# Patient Record
Sex: Male | Born: 1945 | Race: White | Hispanic: No | Marital: Married | Smoking: Former smoker
Health system: Southern US, Community
[De-identification: ages and names within clinical notes are randomized; demographics above are authoritative.]

## PROBLEM LIST (undated history)

## (undated) DIAGNOSIS — Q278 Other specified congenital malformations of peripheral vascular system: Secondary | ICD-10-CM

## (undated) DIAGNOSIS — K56609 Unspecified intestinal obstruction, unspecified as to partial versus complete obstruction: Secondary | ICD-10-CM

## (undated) DIAGNOSIS — J189 Pneumonia, unspecified organism: Secondary | ICD-10-CM

## (undated) DIAGNOSIS — I7122 Aneurysm of the aortic arch, without rupture: Secondary | ICD-10-CM

## (undated) DIAGNOSIS — C9110 Chronic lymphocytic leukemia of B-cell type not having achieved remission: Secondary | ICD-10-CM

## (undated) DIAGNOSIS — M1990 Unspecified osteoarthritis, unspecified site: Secondary | ICD-10-CM

## (undated) DIAGNOSIS — Z87440 Personal history of urinary (tract) infections: Secondary | ICD-10-CM

## (undated) DIAGNOSIS — K5750 Diverticulosis of both small and large intestine without perforation or abscess without bleeding: Secondary | ICD-10-CM

## (undated) DIAGNOSIS — K219 Gastro-esophageal reflux disease without esophagitis: Secondary | ICD-10-CM

## (undated) HISTORY — PX: OTHER SURGICAL HISTORY: SHX169

## (undated) HISTORY — PX: ORIF FINGER / THUMB FRACTURE: SUR932

## (undated) HISTORY — PX: HERNIA REPAIR: SHX51

## (undated) HISTORY — PX: MECKEL DIVERTICULUM EXCISION: SHX314

## (undated) HISTORY — DX: Pneumonia, unspecified organism: J18.9

---

## 2009-11-27 ENCOUNTER — Ambulatory Visit: Payer: Self-pay | Admitting: Family Medicine

## 2009-11-27 DIAGNOSIS — S838X9A Sprain of other specified parts of unspecified knee, initial encounter: Secondary | ICD-10-CM

## 2009-11-27 DIAGNOSIS — S86819A Strain of other muscle(s) and tendon(s) at lower leg level, unspecified leg, initial encounter: Secondary | ICD-10-CM

## 2009-12-03 ENCOUNTER — Telehealth (INDEPENDENT_AMBULATORY_CARE_PROVIDER_SITE_OTHER): Payer: Self-pay | Admitting: *Deleted

## 2010-02-05 NOTE — Assessment & Plan Note (Signed)
Summary: L calf pain/pull x 1 dy rm 5   Vital Signs:  Patient Profile:   65 Years Old Male CC:      L calf pain x 1 dy Height:     70.5 inches Weight:      210 pounds O2 Sat:      99 % O2 treatment:    Room Air Temp:     97.4 degrees F oral Pulse rate:   62 / minute Pulse rhythm:   regular Resp:     16 per minute BP sitting:   149 / 95  (left arm) Cuff size:   regular  Vitals Entered By: Areta Haber CMA (November 27, 2009 11:58 AM)                  Current Allergies: No known allergies History of Present Illness Chief Complaint: L calf pain x 1 dy History of Present Illness:  Subjective:  While playing basketball yesterday, patient felt sudden pulling sensation in his left posterior calf.  He has had persistent mild pain when walking, but not at rest.  No shortness of breath or chest pain.  Current Problems: MUSCLE STRAIN, LEFT CALF (ICD-844.8) FAMILY HISTORY DIABETES 1ST DEGREE RELATIVE (ICD-V18.0)   REVIEW OF SYSTEMS Constitutional Symptoms      Denies fever, chills, night sweats, weight loss, weight gain, and fatigue.  Eyes       Denies change in vision, eye pain, eye discharge, glasses, contact lenses, and eye surgery. Ear/Nose/Throat/Mouth       Denies hearing loss/aids, change in hearing, ear pain, ear discharge, dizziness, frequent runny nose, frequent nose bleeds, sinus problems, sore throat, hoarseness, and tooth pain or bleeding.  Respiratory       Denies dry cough, productive cough, wheezing, shortness of breath, asthma, bronchitis, and emphysema/COPD.  Cardiovascular       Denies murmurs, chest pain, and tires easily with exhertion.    Gastrointestinal       Denies stomach pain, nausea/vomiting, diarrhea, constipation, blood in bowel movements, and indigestion. Genitourniary       Denies painful urination, kidney stones, and loss of urinary control. Neurological       Denies paralysis, seizures, and fainting/blackouts. Musculoskeletal  Complains of muscle pain and decreased range of motion.      Denies joint pain, joint stiffness, redness, swelling, muscle weakness, and gout.      Comments: L calf pain/pull x 1dy Skin       Denies bruising, unusual mles/lumps or sores, and hair/skin or nail changes.  Psych       Denies mood changes, temper/anger issues, anxiety/stress, speech problems, depression, and sleep problems. Other Comments: Pt states he was playing basketball and pulled his L calf muscle.    Past History:  Past Medical History: Unremarkable  Past Surgical History: Inguinal herniorrhaphy  Family History: Family History Diabetes 1st degree relative  Social History: Single Never Smoked Alcohol use-yes Drug use-no Regular exercise-no Smoking Status:  never Does Patient Exercise:  no Drug Use:  no   Objective:  Appearance:  Patient appears healthy, stated age, and in no acute distress  Left leg:  Knee has full range of motion.  Posterior/lateral calf has mild point tenderness laterally.  No swelling, erythema, or warmth.  Distal neurovascular intact.  Homan's test negative.  Achilles tendon intact. Assessment New Problems: MUSCLE STRAIN, LEFT CALF (ICD-844.8) FAMILY HISTORY DIABETES 1ST DEGREE RELATIVE (ICD-V18.0)  MILD CALF STRAIN.  DOES NOT APPEAR TO BE PLANTARIS STRAIN/RUPTURE NOTE  ELEVATED BP TODAY.  Plan New Orders: Ace Wraps 3-5 in/yard  [Z6109] New Patient Level III [99203] Planning Comments:   Continue ice pack several times daily.  Take Ibuprofen 200mg , 4 tabs every 8 hours with food.  Ace wrap applied; recommend obtaining medical graduated compression stockings.  Begin stretching exercises in 3 to 5 days  ([facility] information and instruction patient handout given)   Follow-up with sports med clinic if not improving about 2 weeks.   Recheck BP and follow-up with PCP if remains elevated.   The patient and/or caregiver has been counseled thoroughly with regard to medications  prescribed including dosage, schedule, interactions, rationale for use, and possible side effects and they verbalize understanding.  Diagnoses and expected course of recovery discussed and will return if not improved as expected or if the condition worsens. Patient and/or caregiver verbalized understanding.   Orders Added: 1)  Ace Wraps 3-5 in/yard  [A6449] 2)  New Patient Level III [60454]

## 2010-02-05 NOTE — Letter (Signed)
Summary: Out of Work  [facility]  [address]   [place], [place] [address]   Phone: [phone]  Fax: [phone]    November 27, 2009   Employee:  DANIELA HERNAN    To Whom It May Concern:   For Medical reasons, the above employee should avoid heavy lifting, pushing, pulling, and straining for two weeks.    If you need additional information, please feel free to contact our office.         Sincerely,    Donna Christen MD

## 2010-02-05 NOTE — Progress Notes (Signed)
  Phone Note Outgoing Call Call back at [facility] Phone [phone]   Call placed by: Emilio Math,  December 03, 2009 12:54 PM Call placed to: Patient Action Taken: Phone Call Completed Summary of Call: Leg is much better appreciates the call

## 2011-01-06 ENCOUNTER — Emergency Department
Admission: EM | Admit: 2011-01-06 | Discharge: 2011-01-06 | Disposition: A | Discharge status: Home / Self Care | Payer: Medicare Other | Source: Home / Self Care | Attending: Emergency Medicine | Admitting: Emergency Medicine

## 2011-01-06 ENCOUNTER — Encounter: Payer: Self-pay | Admitting: *Deleted

## 2011-01-06 DIAGNOSIS — N390 Urinary tract infection, site not specified: Secondary | ICD-10-CM

## 2011-01-06 LAB — POCT URINALYSIS DIPSTICK
Nitrite, UA: POSITIVE
Spec Grav, UA: 1.025 (ref 1.005–1.03)
Urobilinogen, UA: 0.2 (ref 0–1)
pH, UA: 6 (ref 5–8)

## 2011-01-06 MED ORDER — SULFAMETHOXAZOLE-TMP DS 800-160 MG PO TABS: 1.0000 | ORAL_TABLET | Freq: Two times a day (BID) | ORAL | Status: AC

## 2011-01-06 NOTE — ED Provider Notes (Signed)
History     CSN: [id number]  Arrival date & time 01/06/11  1041   First MD Initiated Contact with Patient 01/06/11 1121      Chief Complaint  Patient presents with  . Dysuria    (Consider location/radiation/quality/duration/timing/severity/associated sxs/prior treatment) HPI Jason Abbott is a 65 y.o. male who presents today with UTI symptoms for 2 weeks. He has a h/o UTI's about every 2 years. + dysuria + frequency No urgency No hematuria No vaginal discharge No fever/chills No lower abdominal pain No back pain No fatigue    History reviewed. No pertinent past medical history.  Past Surgical History  Procedure Date  . Hernia repair     Family History  Problem Relation Age of Onset  . Diabetes Sister     History  Substance Use Topics  . Smoking status: Never Smoker   . Smokeless tobacco: Not on file  . Alcohol Use: Yes      Review of Systems  Allergies  Review of patient's allergies indicates no known allergies.  Home Medications   Current Outpatient Rx  Name Route Sig Dispense Refill  . SULFAMETHOXAZOLE-TMP DS 800-160 MG PO TABS Oral Take 1 tablet by mouth 2 (two) times daily. 10 tablet 0    BP 115/78  Pulse 69  Temp(Src) 98 F (36.7 C) (Oral)  Resp 16  Ht 6' (1.829 m)  Wt 210 lb (95.255 kg)  BMI 28.48 kg/m2  SpO2 97%  Physical Exam  ED Course  Procedures (including critical care time)   Labs Reviewed  POCT URINALYSIS DIPSTICK   No results found.   1. Urinary tract infection, site not specified       MDM  1) Take the prescribed antibiotic as directed. 2) A urinalysis was done in clinic.  A urine culture is pending. 3) Follow up with your PCP or urologist if not improving or if worsening symptoms.     Lily Kocher, MD 01/06/11 [phone]

## 2011-01-06 NOTE — ED Notes (Signed)
Patient c/o dysuria and urinary frequency x 2-3 weeks. He started taking cranberry tabs and stopped 3 days ago.

## 2011-04-04 ENCOUNTER — Emergency Department (INDEPENDENT_AMBULATORY_CARE_PROVIDER_SITE_OTHER)
Admission: EM | Admit: 2011-04-04 | Discharge: 2011-04-04 | Disposition: A | Discharge status: Home / Self Care | Payer: Medicare Other | Source: Home / Self Care | Attending: Emergency Medicine | Admitting: Emergency Medicine

## 2011-04-04 DIAGNOSIS — R350 Frequency of micturition: Secondary | ICD-10-CM | POA: Diagnosis not present

## 2011-04-04 DIAGNOSIS — R300 Dysuria: Secondary | ICD-10-CM

## 2011-04-04 HISTORY — DX: Personal history of urinary (tract) infections: Z87.440

## 2011-04-04 HISTORY — DX: Unspecified intestinal obstruction, unspecified as to partial versus complete obstruction: K56.609

## 2011-04-04 LAB — POCT URINALYSIS DIP (MANUAL ENTRY)
Bilirubin, UA: NEGATIVE
Glucose, UA: NEGATIVE
Ketones, POC UA: NEGATIVE
Spec Grav, UA: >=1.03 (ref 1.005–1.03)
Urobilinogen, UA: 0.2 (ref 0–1)

## 2011-04-04 MED ORDER — SULFAMETHOXAZOLE-TMP DS 800-160 MG PO TABS: 1.0000 | ORAL_TABLET | Freq: Two times a day (BID) | ORAL | Status: AC

## 2011-04-04 NOTE — ED Provider Notes (Signed)
History     CSN: [id number]  Arrival date & time 04/04/11  1731   First MD Initiated Contact with Patient 04/04/11 1736      Chief Complaint  Patient presents with  . Urinary Frequency    x 4 days    (Consider location/radiation/quality/duration/timing/severity/associated sxs/prior treatment) HPI Jason Abbott is a 66 y.o. male who presents today with UTI symptoms for 4 days.  He was here a few months ago for similar symptoms and took Bactrim which resulted symptoms quickly.   +/- dysuria + frequency No urgency No hematuria No penile discharge No fever/chills No lower abdominal pain No back pain No fatigue    Past Medical History  Diagnosis Date  . H/O urinary infection   . Bowel obstruction     Past Surgical History  Procedure Date  . Hernia repair     Family History  Problem Relation Age of Onset  . Diabetes Sister   . Cancer Mother     pancreatic  . Diabetes Brother   . Diabetes Brother     History  Substance Use Topics  . Smoking status: Former Smoker -- 0.5 packs/day for 23 years    Quit date: 03/05/1988  . Smokeless tobacco: Not on file  . Alcohol Use: 3.6 oz/week    6 Cans of beer per week      Review of Systems  All other systems reviewed and are negative.    Allergies  Review of patient's allergies indicates no known allergies.  Home Medications   Current Outpatient Rx  Name Route Sig Dispense Refill  . IBUPROFEN 200 MG PO TABS Oral Take 200 mg by mouth every 6 (six) hours as needed.    . SULFAMETHOXAZOLE-TMP DS 800-160 MG PO TABS Oral Take 1 tablet by mouth 2 (two) times daily. 10 tablet 0    BP 138/90  Pulse 56  Temp(Src) 97.9 F (36.6 C) (Oral)  Resp 16  Ht 5' 10.5" (1.791 m)  Wt 210 lb (95.255 kg)  BMI 29.71 kg/m2  SpO2 98%  Physical Exam  Nursing note and vitals reviewed. Constitutional: He is oriented to person, place, and time. He appears well-developed and well-nourished.  HENT:  Head: Normocephalic and atraumatic.    Eyes: No scleral icterus.  Neck: Neck supple.  Cardiovascular: Regular rhythm and normal heart sounds.   Pulmonary/Chest: Effort normal and breath sounds normal. No respiratory distress.  Abdominal: Soft. Normal appearance and bowel sounds are normal. He exhibits no mass. There is no rebound, no guarding and no CVA tenderness.  Neurological: He is alert and oriented to person, place, and time.  Skin: Skin is warm and dry.  Psychiatric: He has a normal mood and affect. His speech is normal.    ED Course  Procedures (including critical care time)   Labs Reviewed  POCT URINALYSIS DIP (MANUAL ENTRY) - Abnormal; Notable for the following:   URINE CULTURE   No results found.   1. Dysuria   2. Frequent urination       MDM  1) Take the prescribed antibiotic as directed.   2) A urinalysis was done in clinic.  A urine culture is pending. 3) Follow up a urologist if not improving or if worsening symptoms.  He states that he does not have a PCP but I advised him that he should especially at his age and with recurrent urinary infections.  I've advised him that he will likely get some blood work including a PSA test.  I gave him  information for local physicians and he will call for an appointment.   Marlaine Hind, MD 04/04/11 [phone]

## 2011-04-04 NOTE — ED Notes (Signed)
Patient complains of frequent urination x 4 days. States he does have some burning. He has a history of UTI's.

## 2011-04-07 LAB — URINE CULTURE: Colony Count: >=100000

## 2011-09-15 ENCOUNTER — Other Ambulatory Visit: Payer: Self-pay | Admitting: Physician Assistant

## 2011-09-15 ENCOUNTER — Ambulatory Visit: Payer: Self-pay

## 2011-09-15 DIAGNOSIS — Z043 Encounter for examination and observation following other accident: Secondary | ICD-10-CM | POA: Diagnosis not present

## 2011-09-15 DIAGNOSIS — S6990XA Unspecified injury of unspecified wrist, hand and finger(s), initial encounter: Secondary | ICD-10-CM

## 2011-09-15 DIAGNOSIS — S62609A Fracture of unspecified phalanx of unspecified finger, initial encounter for closed fracture: Secondary | ICD-10-CM | POA: Diagnosis not present

## 2011-11-20 DIAGNOSIS — J329 Chronic sinusitis, unspecified: Secondary | ICD-10-CM | POA: Diagnosis not present

## 2011-11-20 DIAGNOSIS — R0902 Hypoxemia: Secondary | ICD-10-CM | POA: Diagnosis not present

## 2012-01-07 ENCOUNTER — Emergency Department (INDEPENDENT_AMBULATORY_CARE_PROVIDER_SITE_OTHER)
Admission: EM | Admit: 2012-01-07 | Discharge: 2012-01-07 | Disposition: A | Discharge status: Home / Self Care | Payer: Medicare Other | Source: Home / Self Care | Attending: Family Medicine | Admitting: Family Medicine

## 2012-01-07 ENCOUNTER — Encounter: Payer: Self-pay | Admitting: Emergency Medicine

## 2012-01-07 DIAGNOSIS — N390 Urinary tract infection, site not specified: Secondary | ICD-10-CM

## 2012-01-07 DIAGNOSIS — N3000 Acute cystitis without hematuria: Secondary | ICD-10-CM | POA: Diagnosis not present

## 2012-01-07 LAB — POCT URINALYSIS DIP (MANUAL ENTRY)
Nitrite, UA: NEGATIVE
Protein Ur, POC: 100
Spec Grav, UA: >=1.03 (ref 1.005–1.03)
Urobilinogen, UA: 0.2 (ref 0–1)

## 2012-01-07 MED ORDER — CIPROFLOXACIN HCL 500 MG PO TABS: 500.0000 mg | ORAL_TABLET | Freq: Two times a day (BID) | ORAL | Status: DC

## 2012-01-07 NOTE — ED Provider Notes (Signed)
History     CSN: [id number]  Arrival date & time 01/07/12  1126   None     Chief Complaint  Patient presents with  . Urinary Frequency  . Dysuria      HPI Comments: Reports frequency of urination and mild dysuria for 2 days.  Patient is a 67 y.o. male presenting with dysuria. The history is provided by the patient.  Dysuria  This is a new problem. The current episode started 2 days ago. The problem occurs intermittently. The problem has been gradually worsening. The quality of the pain is described as burning. The pain is mild. There has been no fever. Associated symptoms include frequency. Pertinent negatives include no chills, no sweats, no nausea, no vomiting, no discharge, no hematuria, no hesitancy, no urgency and no flank pain. He has tried nothing for the symptoms.    Past Medical History  Diagnosis Date  . H/O urinary infection   . Bowel obstruction     Past Surgical History  Procedure Date  . Hernia repair     Family History  Problem Relation Age of Onset  . Diabetes Sister   . Cancer Mother     pancreatic  . Diabetes Brother   . Diabetes Brother     History  Substance Use Topics  . Smoking status: Former Smoker -- 0.5 packs/day for 23 years    Quit date: 03/05/1988  . Smokeless tobacco: Not on file  . Alcohol Use: 3.6 oz/week    6 Cans of beer per week      Review of Systems  Constitutional: Negative for chills.  Gastrointestinal: Negative for nausea and vomiting.  Genitourinary: Positive for dysuria and frequency. Negative for hesitancy, urgency, hematuria and flank pain.  All other systems reviewed and are negative.    Allergies  Review of patient's allergies indicates no known allergies.  Home Medications   Current Outpatient Rx  Name  Route  Sig  Dispense  Refill  . CIPROFLOXACIN HCL 500 MG PO TABS   Oral   Take 1 tablet (500 mg total) by mouth 2 (two) times daily.   14 tablet   0   . IBUPROFEN 200 MG PO TABS   Oral   Take 200  mg by mouth every 6 (six) hours as needed.           BP 154/85  Pulse 72  Temp 97.9 F (36.6 C) (Oral)  Resp 18  Ht 6' (1.829 m)  Wt 205 lb (92.987 kg)  BMI 27.80 kg/m2  SpO2 98%  Physical Exam Nursing notes and Vital Signs reviewed. Appearance:  Patient appears healthy, stated age, and in no acute distress Eyes:  Pupils are equal, round, and reactive to light and accomodation.  Extraocular movement is intact.  Conjunctivae are not inflamed   Pharynx:  Normal Neck:  Supple.  No adenopathy Lungs:  Clear to auscultation.  Breath sounds are equal.  Heart:  Regular rate and rhythm without murmurs, rubs, or gallops.  Abdomen:  Nontender without masses or hepatosplenomegaly.  Bowel sounds are present.  No CVA or flank tenderness.  Genitourinary:  Penis normal without lesions or urethral discharge.  Scrotum is normal.  Testes are descended bilaterally without nodules or tenderness.  No regional lymphadenopathy palpated  Skin:  No rash present.   ED Course  Procedures none   Labs Reviewed  POCT URINALYSIS DIP (MANUAL ENTRY) moderate BLO, PRO 100mg /dL, small LEU  URINE CULTURE pending      1. UTI (  lower urinary tract infection); suspect prostatitis      MDM  Urine culture pending.  Increase fluid intake. Begin Cipro. Followup with urologist if not improving.  Red flags discussed:  Persistent fever, pain, etc.        Lattie Haw, MD 01/12/12 [phone]

## 2012-01-07 NOTE — ED Notes (Signed)
Reports frequency of urination and mild dysuria x 2 days.

## 2012-01-09 LAB — URINE CULTURE

## 2012-01-16 ENCOUNTER — Telehealth: Payer: Self-pay | Admitting: *Deleted

## 2012-01-19 DIAGNOSIS — Z23 Encounter for immunization: Secondary | ICD-10-CM | POA: Diagnosis not present

## 2012-01-19 DIAGNOSIS — Z0000 Encounter for general adult medical examination without abnormal findings: Secondary | ICD-10-CM | POA: Diagnosis not present

## 2012-01-20 DIAGNOSIS — Z0000 Encounter for general adult medical examination without abnormal findings: Secondary | ICD-10-CM | POA: Diagnosis not present

## 2012-01-20 DIAGNOSIS — Z125 Encounter for screening for malignant neoplasm of prostate: Secondary | ICD-10-CM | POA: Diagnosis not present

## 2012-06-24 ENCOUNTER — Emergency Department (INDEPENDENT_AMBULATORY_CARE_PROVIDER_SITE_OTHER)
Admission: EM | Admit: 2012-06-24 | Discharge: 2012-06-24 | Disposition: A | Discharge status: Home / Self Care | Payer: Medicare Other | Source: Home / Self Care | Attending: Emergency Medicine | Admitting: Emergency Medicine

## 2012-06-24 ENCOUNTER — Encounter: Payer: Self-pay | Admitting: Emergency Medicine

## 2012-06-24 DIAGNOSIS — L237 Allergic contact dermatitis due to plants, except food: Secondary | ICD-10-CM

## 2012-06-24 DIAGNOSIS — L255 Unspecified contact dermatitis due to plants, except food: Secondary | ICD-10-CM | POA: Diagnosis not present

## 2012-06-24 HISTORY — DX: Diverticulosis of both small and large intestine without perforation or abscess without bleeding: K57.50

## 2012-06-24 MED ORDER — METHYLPREDNISOLONE SODIUM SUCC 125 MG IJ SOLR
125.0000 mg | Freq: Once | INTRAMUSCULAR | Status: AC
  Administered 2012-06-24: 125 mg via INTRAMUSCULAR

## 2012-06-24 MED ORDER — PREDNISONE (PAK) 10 MG PO TABS: 10.0000 mg | ORAL_TABLET | Freq: Every day | ORAL | Status: DC

## 2012-06-24 NOTE — ED Provider Notes (Signed)
History     CSN: [id number]  Arrival date & time 06/24/12  1608   First MD Initiated Contact with Patient 06/24/12 1625      Chief Complaint  Patient presents with  . Rash    (Consider location/radiation/quality/duration/timing/severity/associated sxs/prior treatment) HPI This patient complains of a RASH.  He was outside gardening and got into some poison ivy or poison oak.  Very itchy.  Mostly on arms but also on the legs.  Location: arms > legs  Onset: 2 days   Course:  Worsening  Self-treated with: none             Improvement with treatment: N/A  History Itching: yes  Tenderness: no  New medications/antibiotics: no  Pet exposure: no  Recent travel or tropical exposure: no  New soaps, shampoos, detergent, clothing: no  Tick/insect exposure: no   Red Flags Feeling ill: no  Fever: no  Facial/tongue swelling/difficulty breathing:  no  Diabetic or immunocompromised: no    Past Medical History  Diagnosis Date  . H/O urinary infection   . Bowel obstruction   . 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 Hernia repair    . Orif finger / thumb fracture    . Meckel diverticulum excision      Family History  Problem Relation Age of Onset  . Diabetes Sister   . Cancer Mother     pancreatic  . Diabetes Brother   . Diabetes Brother     History  Substance Use Topics  . Smoking status: Former Smoker -- 0.50 packs/day for 23 years    Quit date: 03/05/1988  . Smokeless tobacco: Not on file  . Alcohol Use: 3.6 oz/week    6 Cans of beer per week      Review of Systems  All other systems reviewed and are negative.    Allergies  Review of patient's allergies indicates no known allergies.  Home Medications   Current Outpatient Rx  Name  Route  Sig  Dispense  Refill  . ciprofloxacin (CIPRO) 500 MG tablet   Oral   Take 1 tablet (500 mg total) by mouth 2 (two) times  daily.   14 tablet   0   . ibuprofen (ADVIL,MOTRIN) 200 MG tablet   Oral   Take 200 mg by mouth every 6 (six) hours as needed.         . predniSONE (STERAPRED UNI-PAK) 10 MG tablet   Oral   Take 1 tablet (10 mg total) by mouth daily. 6 day pack, use as directed   21 tablet   0     BP 145/90  Pulse 71  Temp(Src) 97.6 F (36.4 C) (Oral)  Resp 16  Ht 5\' 11"  (1.803 m)  Wt 209 lb (94.802 kg)  BMI 29.16 kg/m2  SpO2 97%  Physical Exam  Nursing note and vitals reviewed. Constitutional: He is oriented to person, place, and time. He appears well-developed and well-nourished.  HENT:  Head: Normocephalic and atraumatic.  Eyes: No scleral icterus.  Neck: Neck supple.  Cardiovascular: Regular rhythm and normal heart sounds.   Pulmonary/Chest: Effort normal and breath sounds normal. No respiratory distress.  Neurological: He is alert and oriented to person, place, and time.  Skin: Skin is warm and dry.  Psychiatric: He has a normal mood and affect. His speech is normal.    ED Course  Procedures (including critical care time)  Labs Reviewed - No data to display No results found.   1. Poison ivy dermatitis       MDM   The patient appears to have a poison ivy dermatitis on all 4 extremities.  I gave him a shot of Solu-Medrol 125 milligrams IM times one.  Then gave him a prescription for 60 of prednisone Dosepak.  Advised to avoid scratching and to do some cool compresses and avoid hot exposure.  Followup if worsening or new symptoms.     Marlaine Hind, MD 06/24/12 1655

## 2012-06-24 NOTE — ED Notes (Signed)
Reports yard work 2 days ago with resulting rash on both arms and scattered on legs.

## 2012-09-18 DIAGNOSIS — IMO0002 Reserved for concepts with insufficient information to code with codable children: Secondary | ICD-10-CM | POA: Diagnosis not present

## 2012-10-04 DIAGNOSIS — M25519 Pain in unspecified shoulder: Secondary | ICD-10-CM | POA: Diagnosis not present

## 2012-12-29 DIAGNOSIS — M5137 Other intervertebral disc degeneration, lumbosacral region: Secondary | ICD-10-CM | POA: Diagnosis not present

## 2013-01-07 DIAGNOSIS — M545 Low back pain, unspecified: Secondary | ICD-10-CM | POA: Diagnosis not present

## 2013-01-07 DIAGNOSIS — IMO0002 Reserved for concepts with insufficient information to code with codable children: Secondary | ICD-10-CM | POA: Diagnosis not present

## 2013-01-07 DIAGNOSIS — M79609 Pain in unspecified limb: Secondary | ICD-10-CM | POA: Diagnosis not present

## 2013-01-14 DIAGNOSIS — M545 Low back pain, unspecified: Secondary | ICD-10-CM | POA: Diagnosis not present

## 2013-01-18 DIAGNOSIS — M545 Low back pain, unspecified: Secondary | ICD-10-CM | POA: Diagnosis not present

## 2013-01-20 ENCOUNTER — Ambulatory Visit (INDEPENDENT_AMBULATORY_CARE_PROVIDER_SITE_OTHER): Payer: Medicare Other | Admitting: Physical Therapy

## 2013-01-20 DIAGNOSIS — M2550 Pain in unspecified joint: Secondary | ICD-10-CM

## 2013-01-20 DIAGNOSIS — R293 Abnormal posture: Secondary | ICD-10-CM

## 2013-01-25 ENCOUNTER — Encounter (INDEPENDENT_AMBULATORY_CARE_PROVIDER_SITE_OTHER): Payer: Medicare Other | Admitting: Physical Therapy

## 2013-01-25 DIAGNOSIS — R293 Abnormal posture: Secondary | ICD-10-CM

## 2013-01-25 DIAGNOSIS — M2550 Pain in unspecified joint: Secondary | ICD-10-CM

## 2013-01-28 ENCOUNTER — Encounter (INDEPENDENT_AMBULATORY_CARE_PROVIDER_SITE_OTHER): Payer: Medicare Other | Admitting: Physical Therapy

## 2013-01-28 DIAGNOSIS — R293 Abnormal posture: Secondary | ICD-10-CM

## 2013-01-28 DIAGNOSIS — M2550 Pain in unspecified joint: Secondary | ICD-10-CM

## 2013-02-01 ENCOUNTER — Encounter: Payer: Medicare Other | Admitting: Physical Therapy

## 2013-02-04 ENCOUNTER — Encounter (INDEPENDENT_AMBULATORY_CARE_PROVIDER_SITE_OTHER): Payer: Medicare Other | Admitting: Physical Therapy

## 2013-02-04 DIAGNOSIS — R293 Abnormal posture: Secondary | ICD-10-CM

## 2013-02-04 DIAGNOSIS — IMO0002 Reserved for concepts with insufficient information to code with codable children: Secondary | ICD-10-CM

## 2013-02-04 DIAGNOSIS — M2550 Pain in unspecified joint: Secondary | ICD-10-CM

## 2013-02-08 ENCOUNTER — Encounter: Payer: Medicare Other | Admitting: Physical Therapy

## 2013-02-11 ENCOUNTER — Encounter: Payer: Medicare Other | Admitting: Physical Therapy

## 2013-07-25 ENCOUNTER — Emergency Department (INDEPENDENT_AMBULATORY_CARE_PROVIDER_SITE_OTHER)
Admission: EM | Admit: 2013-07-25 | Discharge: 2013-07-25 | Disposition: A | Discharge status: Home / Self Care | Payer: Medicare Other | Source: Home / Self Care

## 2013-07-25 ENCOUNTER — Telehealth: Payer: Self-pay | Admitting: Emergency Medicine

## 2013-07-25 ENCOUNTER — Encounter: Payer: Self-pay | Admitting: Emergency Medicine

## 2013-07-25 DIAGNOSIS — H5789 Other specified disorders of eye and adnexa: Secondary | ICD-10-CM

## 2013-07-25 MED ORDER — AZELASTINE HCL 0.05 % OP SOLN: 1.0000 [drp] | Freq: Two times a day (BID) | OPHTHALMIC | Status: DC

## 2013-07-25 MED ORDER — NEOMYCIN-POLYMYXIN-DEXAMETH 3.5-10000-0.1 OP SUSP: 1.0000 [drp] | Freq: Four times a day (QID) | OPHTHALMIC | Status: DC

## 2013-07-25 NOTE — ED Provider Notes (Signed)
CSN: [id number]     Arrival date & time 07/25/13  1053 History   None    Chief Complaint  Patient presents with  . Eye Problem   (Consider location/radiation/quality/duration/timing/severity/associated sxs/prior Treatment) HPI Pt is a 68 yo male who presents to the clinic with 5 days of left eye redness. Pt reports that he just woke up one morning and his eye was red. He denies any fever, chills, eye pain, itchy eyes, or blurred vision. He had one day where left eye was a little watery but cleared up. Denies any eye trauma or feeling like there is something in his eye. No chemicals have been exposed in eyes. Never had anything like this before. Right eye has no redness. Not tried anything to make better. Does not seem to be getting worse just stable. No cough or sneezing.   Past Medical History  Diagnosis Date  . H/O urinary infection   . Bowel obstruction   . 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 Hernia repair    . Orif finger / thumb fracture    . Meckel diverticulum excision     Family History  Problem Relation Age of Onset  . Diabetes Sister   . Cancer Mother     pancreatic  . Diabetes Brother   . Diabetes Brother    History  Substance Use Topics  . Smoking status: Former Smoker -- 0.50 packs/day for 23 years    Quit date: 03/05/1988  . Smokeless tobacco: Not on file  . Alcohol Use: 3.6 oz/week    6 Cans of beer per week    Review of Systems  All other systems reviewed and are negative.   Allergies  Review of patient's allergies indicates not on file.  Home Medications   Prior to Admission medications   Medication Sig Start Date End Date Taking? Authorizing Provider  azelastine (OPTIVAR) 0.05 % ophthalmic solution Place 1 drop into both eyes 2 (two) times daily. 07/25/13   Jade L Breeback, PA-C  ciprofloxacin (CIPRO) 500 MG tablet Take 1 tablet (500 mg total) by mouth 2 (two)  times daily. 01/07/12   Kandra Nicolas, MD  ibuprofen (ADVIL,MOTRIN) 200 MG tablet Take 200 mg by mouth every 6 (six) hours as needed.    Historical Provider, MD  neomycin-polymyxin b-dexamethasone (MAXITROL) 3.5-10000-0.1 SUSP Place 1 drop into the left eye every 6 (six) hours. 07/25/13   Jade L Breeback, PA-C  predniSONE (STERAPRED UNI-PAK) 10 MG tablet Take 1 tablet (10 mg total) by mouth daily. 6 day pack, use as directed 06/24/12   Janeann Forehand, MD   BP 147/90  Pulse 63  Temp(Src) 98.6 F (37 C) (Oral)  Ht 5\' 10"  (1.778 m)  Wt 210 lb (95.255 kg)  BMI 30.13 kg/m2  SpO2 95% Physical Exam  Constitutional: He is oriented to person, place, and time. He appears well-developed and well-nourished.  HENT:  Head: Normocephalic and atraumatic.  Right Ear: External ear normal.  Left Ear: External ear normal.  Nose: Nose normal.  Mouth/Throat: Oropharynx is clear and moist. No oropharyngeal exudate.  TM's clear bilaterally.  Negative for any maxillary or frontal sinus pressure.   Eyes: EOM are normal. Pupils are equal, round, and reactive to light.    Neck: Normal range of motion. Neck supple.  Cardiovascular: Normal rate, regular rhythm and normal heart sounds.   Pulmonary/Chest: Effort normal and breath sounds normal.  He has no wheezes.  Lymphadenopathy:    He has no cervical adenopathy.  Neurological: He is alert and oriented to person, place, and time.  Psychiatric: He has a normal mood and affect. His behavior is normal.    ED Course  Procedures (including critical care time) Labs Review Labs Reviewed - No data to display  Imaging Review No results found.   MDM   1. Redness of left eye    Unclear etiology bacterial conjunctivitis vs  Corneal abrasion.  Did fluorescein staining in office with dye and no abrasion seen.  Vision was 20/30 B, 20/40 L, 20/70 R. Pt is not wearing glasses and does not feel like vision has changes.  Treated for bacterial infection although  with 5 days of infection would have thought there would be more pain and discharge.  Went ahead and made referral to opthalmology for Wednesday at 8:15 if not improving.  Pt had no red flags on todays exam.  optivar 1 drop bid was given for if eyes become itchy and watery for more of an allergic conjunctivitis.     Donella Stade, PA-C 07/25/13 [place], PA-C 07/25/13 1504

## 2013-07-25 NOTE — Discharge Instructions (Signed)
Appt made for [facility] Wednesday at 8:15.  Call with any worsening or changing symptoms.   Bacterial Conjunctivitis Bacterial conjunctivitis, commonly called pink eye, is an inflammation of the clear membrane that covers the white part of the eye (conjunctiva). The inflammation can also happen on the underside of the eyelids. The blood vessels in the conjunctiva become inflamed causing the eye to become red or pink. Bacterial conjunctivitis may spread easily from one eye to another and from person to person (contagious).  CAUSES  Bacterial conjunctivitis is caused by bacteria. The bacteria may come from your own skin, your upper respiratory tract, or from someone else with bacterial conjunctivitis. SYMPTOMS  The normally white color of the eye or the underside of the eyelid is usually pink or red. The pink eye is usually associated with irritation, tearing, and some sensitivity to light. Bacterial conjunctivitis is often associated with a thick, yellowish discharge from the eye. The discharge may turn into a crust on the eyelids overnight, which causes your eyelids to stick together. If a discharge is present, there may also be some blurred vision in the affected eye. DIAGNOSIS  Bacterial conjunctivitis is diagnosed by your caregiver through an eye exam and the symptoms that you report. Your caregiver looks for changes in the surface tissues of your eyes, which may point to the specific type of conjunctivitis. A sample of any discharge may be collected on a cotton-tip swab if you have a severe case of conjunctivitis, if your cornea is affected, or if you keep getting repeat infections that do not respond to treatment. The sample will be sent to a lab to see if the inflammation is caused by a bacterial infection and to see if the infection will respond to antibiotic medicines. TREATMENT   Bacterial conjunctivitis is treated with antibiotics. Antibiotic eyedrops are most often used. However, antibiotic  ointments are also available. Antibiotics pills are sometimes used. Artificial tears or eye washes may ease discomfort. HOME CARE INSTRUCTIONS   To ease discomfort, apply a cool, clean wash cloth to your eye for 10-20 minutes, 3-4 times a day.  Gently wipe away any drainage from your eye with a warm, wet washcloth or a cotton ball.  Wash your hands often with soap and water. Use paper towels to dry your hands.  Do not share towels or wash cloths. This may spread the infection.  Change or wash your pillow case every day.  You should not use eye makeup until the infection is gone.  Do not operate machinery or drive if your vision is blurred.  Stop using contacts lenses. Ask your caregiver how to sterilize or replace your contacts before using them again. This depends on the type of contact lenses that you use.  When applying medicine to the infected eye, do not touch the edge of your eyelid with the eyedrop bottle or ointment tube. SEEK IMMEDIATE MEDICAL CARE IF:   Your infection has not improved within 3 days after beginning treatment.  You had yellow discharge from your eye and it returns.  You have increased eye pain.  Your eye redness is spreading.  Your vision becomes blurred.  You have a fever or persistent symptoms for more than 2-3 days.  You have a fever and your symptoms suddenly get worse.  You have facial pain, redness, or swelling. MAKE SURE YOU:   Understand these instructions.  Will watch your condition.  Will get help right away if you are not doing well or get worse.  Document Released: 12/23/2004 Document Revised: 09/17/2011 Document Reviewed: 05/26/2011 [facility] Patient Information 2015 [place], [place]. This information is not intended to replace advice given to you by your health care provider. Make sure you discuss any questions you have with your health care provider.

## 2013-07-25 NOTE — ED Notes (Signed)
Left eye red, x 5 days a little watery one day, denies pain, itching, no blurred vision.

## 2013-07-26 NOTE — ED Provider Notes (Signed)
Agree with exam, assessment, and plan.   Kandra Nicolas, MD 07/26/13 [phone]

## 2013-07-27 DIAGNOSIS — H1130 Conjunctival hemorrhage, unspecified eye: Secondary | ICD-10-CM | POA: Diagnosis not present

## 2013-07-28 ENCOUNTER — Telehealth: Payer: Self-pay

## 2013-07-28 NOTE — ED Notes (Signed)
Left a message on voice mail asking how patient is feeling and advising to call back with any questions or concerns.  

## 2013-09-18 ENCOUNTER — Emergency Department (INDEPENDENT_AMBULATORY_CARE_PROVIDER_SITE_OTHER)
Admission: EM | Admit: 2013-09-18 | Discharge: 2013-09-18 | Disposition: A | Discharge status: Home / Self Care | Payer: Medicare Other | Source: Home / Self Care | Attending: Family Medicine | Admitting: Family Medicine

## 2013-09-18 DIAGNOSIS — B029 Zoster without complications: Secondary | ICD-10-CM | POA: Diagnosis not present

## 2013-09-18 MED ORDER — VALACYCLOVIR HCL 1 G PO TABS: 1000.0000 mg | ORAL_TABLET | Freq: Three times a day (TID) | ORAL | Status: DC

## 2013-09-18 NOTE — Discharge Instructions (Signed)
Thank you for coming in today. Take Valtrex 3 times daily for 7 days Use Tylenol or ibuprofen as needed Followup with primary care Dr.   Kennith Gain Shingles (herpes zoster) is an infection that is caused by the same virus that causes chickenpox (varicella). The infection causes a painful skin rash and fluid-filled blisters, which eventually break open, crust over, and heal. It may occur in any area of the body, but it usually affects only one side of the body or face. The pain of shingles usually lasts about 1 month. However, some people with shingles may develop long-term (chronic) pain in the affected area of the body. Shingles often occurs many years after the person had chickenpox. It is more common:  In people older than 50 years.  In people with weakened immune systems, such as those with HIV, AIDS, or cancer.  In people taking medicines that weaken the immune system, such as transplant medicines.  In people under great stress. CAUSES  Shingles is caused by the varicella zoster virus (VZV), which also causes chickenpox. After a person is infected with the virus, it can remain in the person's body for years in an inactive state (dormant). To cause shingles, the virus reactivates and breaks out as an infection in a nerve root. The virus can be spread from person to person (contagious) through contact with open blisters of the shingles rash. It will only spread to people who have not had chickenpox. When these people are exposed to the virus, they may develop chickenpox. They will not develop shingles. Once the blisters scab over, the person is no longer contagious and cannot spread the virus to others. SIGNS AND SYMPTOMS  Shingles shows up in stages. The initial symptoms may be pain, itching, and tingling in an area of the skin. This pain is usually described as burning, stabbing, or throbbing.In a few days or weeks, a painful red rash will appear in the area where the pain, itching, and  tingling were felt. The rash is usually on one side of the body in a band or belt-like pattern. Then, the rash usually turns into fluid-filled blisters. They will scab over and dry up in approximately 2-3 weeks. Flu-like symptoms may also occur with the initial symptoms, the rash, or the blisters. These may include:  Fever.  Chills.  Headache.  Upset stomach. DIAGNOSIS  Your health care provider will perform a skin exam to diagnose shingles. Skin scrapings or fluid samples may also be taken from the blisters. This sample will be examined under a microscope or sent to a lab for further testing. TREATMENT  There is no specific cure for shingles. Your health care provider will likely prescribe medicines to help you manage the pain, recover faster, and avoid long-term problems. This may include antiviral drugs, anti-inflammatory drugs, and pain medicines. HOME CARE INSTRUCTIONS   Take a cool bath or apply cool compresses to the area of the rash or blisters as directed. This may help with the pain and itching.   Take medicines only as directed by your health care provider.   Rest as directed by your health care provider.  Keep your rash and blisters clean with mild soap and cool water or as directed by your health care provider.  Do not pick your blisters or scratch your rash. Apply an anti-itch cream or numbing creams to the affected area as directed by your health care provider.  Keep your shingles rash covered with a loose bandage (dressing).  Avoid skin contact  with:  Babies.   Pregnant women.   Children with eczema.   Elderly people with transplants.   People with chronic illnesses, such as leukemia or AIDS.   Wear loose-fitting clothing to help ease the pain of material rubbing against the rash.  Keep all follow-up visits as directed by your health care provider.If the area involved is on your face, you may receive a referral for a specialist, such as an eye doctor  (ophthalmologist) or an ear, nose, and throat (ENT) doctor. Keeping all follow-up visits will help you avoid eye problems, chronic pain, or disability.  SEEK IMMEDIATE MEDICAL CARE IF:   You have facial pain, pain around the eye area, or loss of feeling on one side of your face.  You have ear pain or ringing in your ear.  You have loss of taste.  Your pain is not relieved with prescribed medicines.   Your redness or swelling spreads.   You have more pain and swelling.  Your condition is worsening or has changed.   You have a fever. MAKE SURE YOU:  Understand these instructions.  Will watch your condition.  Will get help right away if you are not doing well or get worse. Document Released: 12/23/2004 Document Revised: 05/09/2013 Document Reviewed: 08/07/2011 [facility] Patient Information 2015 [place], [place]. This information is not intended to replace advice given to you by your health care provider. Make sure you discuss any questions you have with your health care provider.

## 2013-09-18 NOTE — ED Provider Notes (Signed)
Jason Abbott is a 68 y.o. male who presents to Urgent Care today for rash. Patient developed tingling pain along his right ribs starting on September 8. He developed a rash on September 11. He notes a erythematous base with prescription vesicles scattered across a dermatomal pattern on his right trunk. He suspect shingles. No medications tried yet. Symptoms are mild to moderate.   Past Medical History  Diagnosis Date  . H/O urinary infection   . Bowel obstruction   . 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 Smoking status: Former Smoker -- 0.50 packs/day for 23 years    Quit date: 03/05/1988  . Smokeless tobacco: Not on file  . Alcohol Use: 3.6 oz/week    6 Cans of beer per week   ROS as above Medications: No current facility-administered medications for this encounter.   Current Outpatient Prescriptions  Medication Sig Dispense Refill  . valACYclovir (VALTREX) 1000 MG tablet Take 1 tablet (1,000 mg total) by mouth 3 (three) times daily.  21 tablet  0    Exam:  BP 137/89  Pulse 82  Temp(Src) 98.1 F (36.7 C) (Oral)  SpO2 96% Gen: Well NAD Skin: Patches of erythema with grouped vesicles patching across the right trunk and a dermatomal pattern.   No results found for this or any previous visit (from the past 24 hour(s)). No results found.  Assessment and Plan: 68 y.o. male with shingles. Treat with Valtrex.  Discussed warning signs or symptoms. Please see discharge instructions. Patient expresses understanding.   This note was created using Systems analyst. Any transcription errors are unintended.    Gregor Hams, MD 09/18/13 [phone]

## 2013-09-18 NOTE — ED Notes (Signed)
Patients states pain started on right side on Tuesday 09/13/13, rash developed Friday 09/16/13

## 2013-09-21 ENCOUNTER — Telehealth: Payer: Self-pay | Admitting: *Deleted

## 2013-09-21 ENCOUNTER — Telehealth: Payer: Self-pay | Admitting: Emergency Medicine

## 2013-09-21 MED ORDER — HYDROCODONE-ACETAMINOPHEN 5-325 MG PO TABS: 1.0000 | ORAL_TABLET | ORAL | Status: DC | PRN

## 2013-09-21 NOTE — ED Notes (Signed)
Prescription written for Vicodin. #12. When necessary pain  Jacqulyn Cane, MD 09/21/13 [phone]

## 2013-09-21 NOTE — ED Notes (Signed)
Will prescribe #12 Vicodin.--For pain  Jacqulyn Cane, MD 09/21/13 [phone]

## 2013-09-22 ENCOUNTER — Encounter: Payer: Self-pay | Admitting: Emergency Medicine

## 2013-09-22 ENCOUNTER — Emergency Department (INDEPENDENT_AMBULATORY_CARE_PROVIDER_SITE_OTHER)
Admission: EM | Admit: 2013-09-22 | Discharge: 2013-09-22 | Disposition: A | Discharge status: Home / Self Care | Payer: Medicare Other | Source: Home / Self Care | Attending: Emergency Medicine | Admitting: Emergency Medicine

## 2013-09-22 DIAGNOSIS — L259 Unspecified contact dermatitis, unspecified cause: Secondary | ICD-10-CM | POA: Diagnosis not present

## 2013-09-22 DIAGNOSIS — L239 Allergic contact dermatitis, unspecified cause: Secondary | ICD-10-CM

## 2013-09-22 DIAGNOSIS — T7840XA Allergy, unspecified, initial encounter: Secondary | ICD-10-CM

## 2013-09-22 MED ORDER — METHYLPREDNISOLONE ACETATE 80 MG/ML IJ SUSP
80.0000 mg | Freq: Once | INTRAMUSCULAR | Status: AC
  Administered 2013-09-22: 80 mg via INTRAMUSCULAR

## 2013-09-22 MED ORDER — PREDNISONE (PAK) 10 MG PO TABS: ORAL_TABLET | ORAL | Status: DC

## 2013-09-22 NOTE — ED Provider Notes (Signed)
CSN: [id number]     Arrival date & time 09/22/13  1526 History   First MD Initiated Contact with Patient 09/22/13 1559     Chief Complaint  Patient presents with  . Rash   (Consider location/radiation/quality/duration/timing/severity/associated sxs/prior Treatment) HPI Was seen here 9/13 for right trunk shingles , Valtrex 1 g 3 times a day was started without side effects or problems. Recently was prescribed Vicodin when necessary severe pain.  Chief complaint today is pruritic fine rash all over body on neck trunk and extremities bilaterally. He does not recall any new soaps or detergents . The right trunk shingles rash is starting to dry up, no more vesicles, and pain improved to 2/10.  Denies lip swelling or face swelling or breathing problems or wheezing or chest pain or shortness of breath. Denies nausea or vomiting or GI or abdominal symptoms. No lightheadedness or focal neurologic symptoms     Past Medical History  Diagnosis Date  . H/O urinary infection   . Bowel obstruction   . 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 Hernia repair    . Orif finger / thumb fracture    . Meckel diverticulum excision     Family History  Problem Relation Age of Onset  . Diabetes Sister   . Cancer Mother     pancreatic  . Diabetes Brother   . Diabetes Brother    History  Substance Use Topics  . Smoking status: Former Smoker -- 0.50 packs/day for 23 years    Quit date: 03/05/1988  . Smokeless tobacco: Not on file  . Alcohol Use: 3.6 oz/week    6 Cans of beer per week    Review of Systems  All other systems reviewed and are negative.   Allergies  Review of patient's allergies indicates no known allergies.  Home Medications   Prior to Admission medications   Medication Sig Start Date End Date Taking? Authorizing Provider  predniSONE (STERAPRED UNI-PAK) 10 MG tablet Start on Fri 9/18 :Take 6  on day 1, 5 on day 2, 4 on day 3, then 3 tablets on day 4, then 2 tablets on day 5, then 1 on day 6. 09/22/13   Jacqulyn Cane, MD  valACYclovir (VALTREX) 1000 MG tablet Take 1 tablet (1,000 mg total) by mouth 3 (three) times daily. 09/18/13 10/02/13  Gregor Hams, MD   BP 144/75  Pulse 90  Temp(Src) 98.1 F (36.7 C) (Oral)  Ht 5\' 11"  (1.803 m)  Wt 209 lb (94.802 kg)  BMI 29.16 kg/m2  SpO2 95% Physical Exam  Nursing note and vitals reviewed. Constitutional: He is oriented to person, place, and time. He appears well-developed and well-nourished. No distress.  HENT:  Head: Normocephalic and atraumatic.  Mouth/Throat: Oropharynx is clear and moist.  No facial edema. Oropharynx clear, airway intact.  Eyes: Conjunctivae and EOM are normal. Pupils are equal, round, and reactive to light. No scleral icterus.  Neck: Normal range of motion.  Cardiovascular: Normal rate, regular rhythm and normal heart sounds.   No murmur heard. Pulmonary/Chest: Effort normal and breath sounds normal. No respiratory distress. He has no wheezes. He has no rales.  Abdominal: Soft. He exhibits no distension. There is no tenderness.  Musculoskeletal: Normal range of motion. He exhibits no edema and no tenderness.  Neurological: He is alert and oriented to person, place, and time. No cranial nerve deficit.  Skin: Skin is warm.  He is not diaphoretic.  The shingles rash in a dermatome distribution, right trunk, is drying up without any vesicles or any sign of secondary infection.  There is a completely different rash: Fine, red, morbilliform, slightly blanching rash diffusely on trunk, extremities, bilaterally. Neck and face not affected.  Psychiatric: He has a normal mood and affect.    ED Course  Procedures (including critical care time) Labs Review Labs Reviewed - No data to display  Imaging Review No results found.   MDM   1. Allergic dermatitis   2. Allergic reaction, initial encounter    Fine allergic  rash or drug rash, unknown cause. Not certain if it's from Valtrex or hydrocodone or other medications, or other cause.  His shingles rash is significantly improving on the Valtrex and pain is significantly decreased from 9/13.  Treatment options discussed, as well as risks, benefits, alternatives. Patient and wife voiced understanding and agreement with the following plans: Depo-Medrol 80 mg IM stat. Start prednisone 10 mg-6 day Dosepak tomorrow--this was electronically prescribed to his pharmacy.  As a separate problem, his shingles pain is significantly improving. Pain is now 2/10 in the right thorax dermatomal distribution. Advised to DC hydrocodone for now.  Discussed the option of stopping the Valtrex, however he prefers to continue Valtrex for now.--Risks discussed.  Follow-up with your primary care doctor in 2-3 days if not improving, or sooner if symptoms become worse. Precautions discussed. Red flags discussed. Questions invited and answered. Patient and wife voiced understanding and agreement.  Over 25 minutes spent, greater than 50% of the time spent for counseling and coordination of care.    Jacqulyn Cane, MD 09/22/13 2009

## 2013-09-22 NOTE — ED Notes (Signed)
Red itchy rash all over since yesterday

## 2013-09-23 ENCOUNTER — Telehealth: Payer: Self-pay | Admitting: Emergency Medicine

## 2013-09-29 MED ORDER — HYDROCODONE-ACETAMINOPHEN 5-325 MG PO TABS: 2.0000 | ORAL_TABLET | ORAL | Status: DC | PRN

## 2013-09-29 NOTE — ED Provider Notes (Signed)
Pt here requesting refill of hydrocodone.   Pt reports still has pain.  He has finished steroids.  Pt given Rx for Hydrocodone #16.   Pt advised to follow up with primary.   [place], PA-C 09/29/13 1123

## 2013-09-30 NOTE — ED Provider Notes (Signed)
Medical history/examination/treatment/procedure(s) were performed by non-physician provider and as supervising physician I was immediately available for consultation/collaboration.   Jacqulyn Cane, MD 09/30/13 [phone]

## 2013-10-03 ENCOUNTER — Ambulatory Visit: Payer: Medicare Other | Admitting: Physician Assistant

## 2013-10-03 ENCOUNTER — Ambulatory Visit (INDEPENDENT_AMBULATORY_CARE_PROVIDER_SITE_OTHER): Payer: Medicare Other | Admitting: Sports Medicine

## 2013-10-03 ENCOUNTER — Encounter: Payer: Self-pay | Admitting: Sports Medicine

## 2013-10-03 VITALS — BP 135/79 | HR 62 | Ht 71.0 in | Wt 205.0 lb

## 2013-10-03 DIAGNOSIS — M25519 Pain in unspecified shoulder: Secondary | ICD-10-CM | POA: Diagnosis not present

## 2013-10-03 DIAGNOSIS — M25511 Pain in right shoulder: Secondary | ICD-10-CM | POA: Insufficient documentation

## 2013-10-03 DIAGNOSIS — B029 Zoster without complications: Secondary | ICD-10-CM | POA: Insufficient documentation

## 2013-10-03 HISTORY — DX: Zoster without complications: B02.9

## 2013-10-03 MED ORDER — PREGABALIN 75 MG PO CAPS: 75.0000 mg | ORAL_CAPSULE | Freq: Two times a day (BID) | ORAL | Status: DC

## 2013-10-03 MED ORDER — HYDROCODONE-ACETAMINOPHEN 10-325 MG PO TABS: 1.0000 | ORAL_TABLET | Freq: Three times a day (TID) | ORAL | Status: DC | PRN

## 2013-10-03 MED ORDER — VALACYCLOVIR HCL 1 G PO TABS: 1000.0000 mg | ORAL_TABLET | Freq: Every day | ORAL | Status: DC

## 2013-10-03 NOTE — Assessment & Plan Note (Signed)
Unfortunately several weeks into his outbreak. Continue Valtrex 1000 mg daily. Adding Lyrica and Vicodin. Return to see me in 4 weeks.

## 2013-10-03 NOTE — Assessment & Plan Note (Signed)
We will put this on the back burner for now.

## 2013-10-03 NOTE — Patient Instructions (Signed)
Postherpetic Neuralgia Postherpetic neuralgia (PHN) is nerve pain that occurs after a shingles infection. Shingles is a painful rash that appears on one side of the body, usually on your trunk or face. Shingles is caused by the varicella-zoster virus. This is the same virus that causes chickenpox. In people who have had chickenpox, the virus can resurface years later and cause shingles. You may have PHN if you continue to have pain for 3 months after your shingles rash has gone away. PHN appears in the same area where you had the shingles rash. For most people, PHN goes away within 1 year.  Getting a vaccination for shingles can prevent PHN. This vaccine is recommended for people older than 50. It may prevent shingles and may also lower your risk of PHN if you do get shingles. CAUSES PHN is caused by damage to your nerves from the varicella-zoster virus. This damage makes your nerves overly sensitive.  RISK FACTORS Aging is the biggest risk factor for developing PHN. Most people who get PHN are older than 60. Other risk factors include:  Having very bad pain before your shingles rash starts.  Having a very bad rash.  Having shingles in the nerve that supplies your face and eye (trigeminal nerve). SIGNS AND SYMPTOMS Pain is the main symptom of PHN. The pain is often very bad and may be described as stabbing, burning, or feeling like an electric shock. The pain may come and go or may be there all the time. Pain may be triggered by light touches on the skin or changes in temperature. You may have itching along with the pain. DIAGNOSIS  Your health care provider may diagnose PHN based on your symptoms and your history of shingles. Lab studies and other diagnostic tests are usually not needed. TREATMENT  There is no cure for PHN. Treatment for PHN will focus on pain relief. Over-the-counter pain relievers do not usually relieve PHN pain. You may need to work with a pain specialist. Treatment may  include:  Antidepressant medicines to help with pain and improve sleep.  Antiseizure medicines to relieve nerve pain.  Strong pain relievers (opioids).  A numbing patch worn on the skin (lidocaine patch). HOME CARE INSTRUCTIONS It may take a long time to recover from PHN. Work closely with your health care provider, and have a good support system at home.   Take all medicines as directed by your health care provider.  Wear loose, comfortable clothing.  Cover sensitive areas with a dressing to reduce friction from clothing rubbing on the area.  If cold does not make your pain worse, try applying a cool compress or cooling gel pack to the area.  Talk to your health care provider if you feel depressed or desperate. Living with long-term pain can be depressing. SEEK MEDICAL CARE IF:  Your medicine is not helping.  You are struggling to manage your pain at home. Document Released: 03/15/2002 Document Revised: 05/09/2013 Document Reviewed: 12/14/2012 [employer] Patient Information 2015 [employer]. This information is not intended to replace advice given to you by your health care provider. Make sure you discuss any questions you have with your health care provider.  

## 2013-10-03 NOTE — Progress Notes (Signed)
  Subjective:    CC: Shingles   HPI:  This is a very pleasant 68 year old male, initially he was supposed to see Iran Planas, PA-C however he is having a shingles outbreak and she is pregnant. Rash and burning sensation has been present now for several weeks, he was seen in urgent care placed on Valtrex appropriately unfortunately continues to have burning type pain along the right thorax. Symptoms are moderate, persistent.  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  Past medical history, Surgical history, Family history not pertinant except as noted below, Social history, Allergies, and medications have been entered into the medical record, reviewed, and no changes needed.   Review of Systems: No headache, visual changes, nausea, vomiting, diarrhea, constipation, dizziness, abdominal pain, skin rash, fevers, chills, night sweats, swollen lymph nodes, weight loss, chest pain, body aches, joint swelling, muscle aches, shortness of breath, mood changes, visual or auditory hallucinations.  Objective:    General: Well Developed, well nourished, and in no acute distress.  Neuro: Alert and oriented x3, extra-ocular muscles intact, sensation grossly intact.  HEENT: Normocephalic, atraumatic, pupils equal round reactive to light, neck supple, no masses, no lymphadenopathy, thyroid nonpalpable.  Skin: Warm and dry, erythematous, papulovesicular rash without signs of bacterial superinfection noted in a right-sided T6 dermatomal distribution.  Cardiac: Regular rate and rhythm, no murmurs rubs or gallops.  Respiratory: Clear to auscultation bilaterally. Not using accessory muscles, speaking in full sentences.  Abdominal: Soft, nontender, nondistended, positive bowel sounds, no masses, no organomegaly.  Right Shoulder:  Inspection reveals no abnormalities, atrophy or asymmetry. Palpation is normal  with no tenderness over AC joint or bicipital groove. ROM is full in all planes. Rotator cuff strength normal throughout. Positive Neer and Hawkin's tests, empty can. Speeds and Yergason's tests normal. No labral pathology noted with negative Obrien's, negative crank, negative clunk, and good stability. Normal scapular function observed. No painful arc and no drop arm sign. No apprehension sign  Impression and Recommendations:    The patient was counselled, risk factors were discussed, anticipatory guidance given.

## 2013-10-13 ENCOUNTER — Telehealth: Payer: Self-pay | Admitting: *Deleted

## 2013-10-13 MED ORDER — GABAPENTIN 300 MG PO CAPS: ORAL_CAPSULE | ORAL | Status: DC

## 2013-10-13 NOTE — Telephone Encounter (Signed)
Voicemail left for patient. Margette Fast, CMA

## 2013-10-13 NOTE — Telephone Encounter (Signed)
[employer] denied lyrica due to not trying/failing gabapentin immediate release first. Margette Fast, CMA

## 2013-10-13 NOTE — Telephone Encounter (Signed)
[name], calling in gabapentin, please let him know ASAP so he can have some relief.

## 2013-10-14 ENCOUNTER — Telehealth: Payer: Self-pay

## 2013-10-14 MED ORDER — PROMETHAZINE HCL 25 MG PO TABS: 25.0000 mg | ORAL_TABLET | Freq: Four times a day (QID) | ORAL | Status: DC | PRN

## 2013-10-14 NOTE — Telephone Encounter (Signed)
Phenergan

## 2013-10-14 NOTE — Telephone Encounter (Signed)
Patient request a rx for nausea .Jason Abbott,CMA

## 2013-10-17 NOTE — Telephone Encounter (Signed)
Patient has been informed. Rhonda Cunningham,CMA  

## 2013-10-27 DIAGNOSIS — M545 Low back pain: Secondary | ICD-10-CM | POA: Diagnosis not present

## 2013-10-27 DIAGNOSIS — B0229 Other postherpetic nervous system involvement: Secondary | ICD-10-CM | POA: Diagnosis not present

## 2013-10-27 DIAGNOSIS — M5136 Other intervertebral disc degeneration, lumbar region: Secondary | ICD-10-CM | POA: Diagnosis not present

## 2013-10-28 ENCOUNTER — Encounter: Payer: Self-pay | Admitting: Sports Medicine

## 2013-10-28 ENCOUNTER — Ambulatory Visit (INDEPENDENT_AMBULATORY_CARE_PROVIDER_SITE_OTHER): Payer: Medicare Other | Admitting: Sports Medicine

## 2013-10-28 VITALS — BP 113/75 | HR 89 | Ht 71.0 in | Wt 201.0 lb

## 2013-10-28 DIAGNOSIS — B029 Zoster without complications: Secondary | ICD-10-CM

## 2013-10-28 MED ORDER — AMITRIPTYLINE HCL 50 MG PO TABS: ORAL_TABLET | ORAL | Status: DC

## 2013-10-28 NOTE — Assessment & Plan Note (Signed)
Desires to discontinue gabapentin and hydrocodone. We will use amitriptyline instead for postherpetic neuralgia-type symptoms.

## 2013-10-28 NOTE — Progress Notes (Signed)
  Subjective:    CC: Followup  HPI: Shingles: Improving slightly, has not been taking his gabapentin, only an occasional hydrocodone.  Past medical history, Surgical history, Family history not pertinant except as noted below, Social history, Allergies, and medications have been entered into the medical record, reviewed, and no changes needed.   Review of Systems: No fevers, chills, night sweats, weight loss, chest pain, or shortness of breath.   Objective:    General: Well Developed, well nourished, and in no acute distress.  Neuro: Alert and oriented x3, extra-ocular muscles intact, sensation grossly intact.  HEENT: Normocephalic, atraumatic, pupils equal round reactive to light, neck supple, no masses, no lymphadenopathy, thyroid nonpalpable.  Skin: Warm and dry, n still has the right-sided mid thoracic shingles type rash, it does appear to be coalescing. Cardiac: Regular rate and rhythm, no murmurs rubs or gallops, no lower extremity edema.  Respiratory: Clear to auscultation bilaterally. Not using accessory muscles, speaking in full sentences.  Impression and Recommendations:

## 2014-01-30 DIAGNOSIS — H527 Unspecified disorder of refraction: Secondary | ICD-10-CM | POA: Diagnosis not present

## 2014-01-30 DIAGNOSIS — H52223 Regular astigmatism, bilateral: Secondary | ICD-10-CM | POA: Diagnosis not present

## 2014-01-30 DIAGNOSIS — H25813 Combined forms of age-related cataract, bilateral: Secondary | ICD-10-CM | POA: Diagnosis not present

## 2014-02-08 DIAGNOSIS — H25813 Combined forms of age-related cataract, bilateral: Secondary | ICD-10-CM | POA: Diagnosis not present

## 2014-02-08 DIAGNOSIS — H527 Unspecified disorder of refraction: Secondary | ICD-10-CM | POA: Diagnosis not present

## 2014-02-08 DIAGNOSIS — H52223 Regular astigmatism, bilateral: Secondary | ICD-10-CM | POA: Diagnosis not present

## 2014-02-16 DIAGNOSIS — H25811 Combined forms of age-related cataract, right eye: Secondary | ICD-10-CM | POA: Diagnosis not present

## 2014-02-16 DIAGNOSIS — Z87891 Personal history of nicotine dependence: Secondary | ICD-10-CM | POA: Diagnosis not present

## 2014-02-16 DIAGNOSIS — H527 Unspecified disorder of refraction: Secondary | ICD-10-CM | POA: Diagnosis not present

## 2014-02-16 DIAGNOSIS — H25813 Combined forms of age-related cataract, bilateral: Secondary | ICD-10-CM | POA: Diagnosis not present

## 2014-02-16 DIAGNOSIS — H52221 Regular astigmatism, right eye: Secondary | ICD-10-CM | POA: Diagnosis not present

## 2014-02-16 DIAGNOSIS — H2511 Age-related nuclear cataract, right eye: Secondary | ICD-10-CM | POA: Diagnosis not present

## 2014-02-16 DIAGNOSIS — H52223 Regular astigmatism, bilateral: Secondary | ICD-10-CM | POA: Diagnosis not present

## 2014-02-20 ENCOUNTER — Ambulatory Visit: Payer: Medicare Other | Admitting: Family Medicine

## 2014-02-23 ENCOUNTER — Encounter: Payer: Self-pay | Admitting: Sports Medicine

## 2014-02-23 ENCOUNTER — Ambulatory Visit (INDEPENDENT_AMBULATORY_CARE_PROVIDER_SITE_OTHER): Payer: Medicare Other | Admitting: Sports Medicine

## 2014-02-23 VITALS — BP 121/79 | HR 65 | Ht 71.0 in | Wt 207.0 lb

## 2014-02-23 DIAGNOSIS — R1013 Epigastric pain: Secondary | ICD-10-CM | POA: Insufficient documentation

## 2014-02-23 DIAGNOSIS — K279 Peptic ulcer, site unspecified, unspecified as acute or chronic, without hemorrhage or perforation: Secondary | ICD-10-CM | POA: Insufficient documentation

## 2014-02-23 DIAGNOSIS — R1011 Right upper quadrant pain: Secondary | ICD-10-CM

## 2014-02-23 MED ORDER — ESOMEPRAZOLE MAGNESIUM 40 MG PO CPDR: DELAYED_RELEASE_CAPSULE | ORAL | Status: DC

## 2014-02-23 NOTE — Assessment & Plan Note (Addendum)
Right upper quadrant pain, epigastric, and radiating to the back, differential is fairly large but likely represents peptic ulcer disease versus biliary colic. CBC, metabolic panel, H. Pylori urease breath test, lipase, amylase, abdominal ultrasound.  Starting Nexium 40 mg in the evening. return to see me in 2 weeks.

## 2014-02-23 NOTE — Progress Notes (Signed)
  Subjective:    CC: back pain, abdominal pain  HPI: Jason Abbott is a pleasant 69 year old male, I saw him for a right T8 shingles outbreak, he did relatively well with Valtrex, and gabapentin. His shingles rash is still present however pain is gone. More recently he developed pain in the right side of his upper back, occasionally with radiation to the right upper quadrant of the abdomen and epigastric region. He gets bloating, denies any melena or hematochezia but does have significant nausea, symptoms are intermittent. He tells me he occasionally takes a calcium carbonate which improves his symptoms. It is not made worse with any type of food. Moderate, persistent. No fevers, chills, night sweats, or weight loss.  Past medical history, Surgical history, Family history not pertinant except as noted below, Social history, Allergies, and medications have been entered into the medical record, reviewed, and no changes needed.   Review of Systems: No fevers, chills, night sweats, weight loss, chest pain, or shortness of breath.   Objective:    General: Well Developed, well nourished, and in no acute distress.  Neuro: Alert and oriented x3, extra-ocular muscles intact, sensation grossly intact.  HEENT: Normocephalic, atraumatic, pupils equal round reactive to light, neck supple, no masses, no lymphadenopathy, thyroid nonpalpable.  Skin: Warm and dry, no rashes. Cardiac: Regular rate and rhythm, no murmurs rubs or gallops, no lower extremity edema.  Respiratory: Clear to auscultation bilaterally. Not using accessory muscles, speaking in full sentences. Abdomen: Soft, tender to palpation with mild guarding in the epigastrium and the right upper quadrant. No costovertebral angle pain, no tenderness to percussion over each of the vertebral spinous processes, normal bowel sounds, no palpable masses.  The right-sided shingles rash is still present, and confluent. No longer tender to palpation with good  sensation.  Impression and Recommendations:

## 2014-02-24 ENCOUNTER — Ambulatory Visit (INDEPENDENT_AMBULATORY_CARE_PROVIDER_SITE_OTHER): Payer: Medicare Other

## 2014-02-24 DIAGNOSIS — K7689 Other specified diseases of liver: Secondary | ICD-10-CM | POA: Diagnosis not present

## 2014-02-24 DIAGNOSIS — R932 Abnormal findings on diagnostic imaging of liver and biliary tract: Secondary | ICD-10-CM

## 2014-02-24 DIAGNOSIS — R140 Abdominal distension (gaseous): Secondary | ICD-10-CM | POA: Diagnosis not present

## 2014-02-24 DIAGNOSIS — R1011 Right upper quadrant pain: Secondary | ICD-10-CM

## 2014-02-24 LAB — CBC WITH DIFFERENTIAL/PLATELET
Basophils Absolute: 0.1 10*3/uL (ref 0.0–0.1)
Basophils Relative: 1 % (ref 0–1)
Eosinophils Absolute: 0.3 K/uL (ref 0.0–0.7)
Eosinophils Relative: 3 % (ref 0–5)
HCT: 45.5 % (ref 39.0–52.0)
Hemoglobin: 15.6 g/dL (ref 13.0–17.0)
Lymphocytes Relative: 52 % — ABNORMAL HIGH (ref 12–46)
Lymphs Abs: 5.5 K/uL — ABNORMAL HIGH (ref 0.7–4.0)
MCH: 31.3 pg (ref 26.0–34.0)
MCHC: 34.3 g/dL (ref 30.0–36.0)
MCV: 91.4 fL (ref 78.0–100.0)
MPV: 11.7 fL (ref 8.6–12.4)
Monocytes Absolute: 1.1 10*3/uL — ABNORMAL HIGH (ref 0.1–1.0)
Monocytes Relative: 10 % (ref 3–12)
Neutro Abs: 3.6 10*3/uL (ref 1.7–7.7)
Neutrophils Relative %: 34 % — ABNORMAL LOW (ref 43–77)
Platelets: 165 10*3/uL (ref 150–400)
RBC: 4.98 MIL/uL (ref 4.22–5.81)
RDW: 13.3 % (ref 11.5–15.5)
WBC: 10.6 10*3/uL — ABNORMAL HIGH (ref 4.0–10.5)

## 2014-02-24 LAB — URINALYSIS
Bilirubin Urine: NEGATIVE
Glucose, UA: NEGATIVE mg/dL
Hgb urine dipstick: NEGATIVE
Ketones, ur: NEGATIVE mg/dL
Leukocytes, UA: NEGATIVE
Nitrite: NEGATIVE
Protein, ur: NEGATIVE mg/dL
Specific Gravity, Urine: 1.024 (ref 1.005–1.030)
Urobilinogen, UA: 0.2 mg/dL (ref 0.0–1.0)
pH: 5.5 (ref 5.0–8.0)

## 2014-02-24 LAB — COMPREHENSIVE METABOLIC PANEL WITH GFR
ALT: 16 U/L (ref 0–53)
AST: 14 U/L (ref 0–37)
Albumin: 4.4 g/dL (ref 3.5–5.2)
CO2: 32 meq/L (ref 19–32)
Calcium: 9.5 mg/dL (ref 8.4–10.5)
Chloride: 102 meq/L (ref 96–112)
Potassium: 4.4 meq/L (ref 3.5–5.3)

## 2014-02-24 LAB — COMPREHENSIVE METABOLIC PANEL
Alkaline Phosphatase: 61 U/L (ref 39–117)
BUN: 14 mg/dL (ref 6–23)
Creat: 1.05 mg/dL (ref 0.50–1.35)
Glucose, Bld: 99 mg/dL (ref 70–99)
Sodium: 139 mEq/L (ref 135–145)
Total Bilirubin: 0.4 mg/dL (ref 0.2–1.2)
Total Protein: 6.6 g/dL (ref 6.0–8.3)

## 2014-02-24 LAB — LIPASE: Lipase: 16 U/L (ref 0–75)

## 2014-02-24 LAB — AMYLASE: Amylase: 55 U/L (ref 0–105)

## 2014-02-24 NOTE — Progress Notes (Signed)
LEFT MESSAGE ON PATIENT VM TO CALL BACK REGARDING APPT NURSE VISIT FOR H PYLORI BREATH TEST. Kami Kube,CMA

## 2014-03-02 ENCOUNTER — Other Ambulatory Visit: Payer: Self-pay | Admitting: Sports Medicine

## 2014-03-02 ENCOUNTER — Encounter: Payer: Self-pay | Admitting: Sports Medicine

## 2014-03-02 MED ORDER — SUCRALFATE 1 G PO TABS: 1.0000 g | ORAL_TABLET | Freq: Four times a day (QID) | ORAL | Status: DC

## 2014-03-09 ENCOUNTER — Ambulatory Visit: Payer: Medicare Other | Admitting: Sports Medicine

## 2014-03-10 ENCOUNTER — Ambulatory Visit (INDEPENDENT_AMBULATORY_CARE_PROVIDER_SITE_OTHER): Payer: Medicare Other | Admitting: Sports Medicine

## 2014-03-10 VITALS — BP 126/87 | HR 68

## 2014-03-10 DIAGNOSIS — R1011 Right upper quadrant pain: Secondary | ICD-10-CM

## 2014-03-10 NOTE — Progress Notes (Signed)
   Subjective:    Patient ID: Jason Abbott., male    DOB: 1945-09-26, 69 y.o.   MRN: [id number]  HPI  Areli is here for a H-pylori breath test.    Review of Systems     Objective:   Physical Exam        Assessment & Plan:  Abdominal RUQ pain - ordered H-pylori Breath Test

## 2014-03-10 NOTE — Assessment & Plan Note (Addendum)
Right upper quadrant pain, epigastric, and radiating to the back, differential is fairly large but likely represents peptic ulcer disease versus biliary colic. CBC, metabolic panel, H. Pylori urease breath test was performed today, lipase, amylase, abdominal ultrasound.  Starting Nexium 40 mg in the evening.  Urease breath test is positive, starting Prevpac, stop Nexium for now.

## 2014-03-13 LAB — H. PYLORI BREATH TEST: H. pylori Breath Test: DETECTED — AB

## 2014-03-13 MED ORDER — AMOXICILL-CLARITHRO-LANSOPRAZ PO MISC: Freq: Two times a day (BID) | ORAL | Status: DC

## 2014-03-13 NOTE — Addendum Note (Signed)
Addended by: Silverio Decamp on: 03/13/2014 01:11 PM   Modules accepted: Orders

## 2014-03-14 ENCOUNTER — Encounter: Payer: Self-pay | Admitting: Sports Medicine

## 2014-03-14 ENCOUNTER — Ambulatory Visit (INDEPENDENT_AMBULATORY_CARE_PROVIDER_SITE_OTHER): Payer: Medicare Other | Admitting: Sports Medicine

## 2014-03-14 VITALS — BP 134/92 | HR 110 | Wt 205.0 lb

## 2014-03-14 DIAGNOSIS — K279 Peptic ulcer, site unspecified, unspecified as acute or chronic, without hemorrhage or perforation: Secondary | ICD-10-CM

## 2014-03-14 NOTE — Progress Notes (Signed)
  Subjective:    CC: Follow-up  HPI: Abdominal pain: Right upper quadrant and epigastric, moderate, persistent, typically occurs an hour or so after eating, particularly spicy foods, he does not get any pain immediately. Workup including metabolic panel, lipase, and CBC were unremarkable, he did have a positive H. pylori urease breath test.  Past medical history, Surgical history, Family history not pertinant except as noted below, Social history, Allergies, and medications have been entered into the medical record, reviewed, and no changes needed.   Review of Systems: No fevers, chills, night sweats, weight loss, chest pain, or shortness of breath.   Objective:    General: Well Developed, well nourished, and in no acute distress.  Neuro: Alert and oriented x3, extra-ocular muscles intact, sensation grossly intact.  HEENT: Normocephalic, atraumatic, pupils equal round reactive to light, neck supple, no masses, no lymphadenopathy, thyroid nonpalpable.  Skin: Warm and dry, no rashes. Cardiac: Regular rate and rhythm, no murmurs rubs or gallops, no lower extremity edema.  Respiratory: Clear to auscultation bilaterally. Not using accessory muscles, speaking in full sentences.  Impression and Recommendations:

## 2014-03-14 NOTE — Assessment & Plan Note (Signed)
H. pylori was positive, combined with pain hours after eating suggests one ulcer. He was not anemic suggesting no clinically relevant GI bleed. We are going to start Prevpac in a hope of curing this.  Return in a month.

## 2014-03-14 NOTE — Patient Instructions (Signed)
Peptic Ulcer A peptic ulcer is a sore in the lining of your esophagus (esophageal ulcer), stomach (gastric ulcer), or in the first part of your small intestine (duodenal ulcer). The ulcer causes erosion into the deeper tissue. CAUSES  Normally, the lining of the stomach and the small intestine protects itself from the acid that digests food. The protective lining can be damaged by:  An infection caused by a bacterium called Helicobacter pylori (H. pylori).  Regular use of nonsteroidal anti-inflammatory drugs (NSAIDs), such as ibuprofen or aspirin.  Smoking tobacco. Other risk factors include being older than 69, drinking alcohol excessively, and having a family history of ulcer disease.  SYMPTOMS   Burning pain or gnawing in the area between the chest and the belly button.  Heartburn.  Nausea and vomiting.  Bloating. The pain can be worse on an empty stomach and at night. If the ulcer results in bleeding, it can cause:  Black, tarry stools.  Vomiting of bright red blood.  Vomiting of coffee-ground-looking materials. DIAGNOSIS  A diagnosis is usually made based upon your history and an exam. Other tests and procedures may be performed to find the cause of the ulcer. Finding a cause will help determine the best treatment. Tests and procedures may include:  Blood tests, stool tests, or breath tests to check for the bacterium H. pylori.  An upper gastrointestinal (GI) series of the esophagus, stomach, and small intestine.  An endoscopy to examine the esophagus, stomach, and small intestine.  A biopsy. TREATMENT  Treatment may include:  Eliminating the cause of the ulcer, such as smoking, NSAIDs, or alcohol.  Medicines to reduce the amount of acid in your digestive tract.  Antibiotic medicines if the ulcer is caused by the H. pylori bacterium.  An upper endoscopy to treat a bleeding ulcer.  Surgery if the bleeding is severe or if the ulcer created a hole somewhere in the  digestive system. HOME CARE INSTRUCTIONS   Avoid tobacco, alcohol, and caffeine. Smoking can increase the acid in the stomach, and continued smoking will impair the healing of ulcers.  Avoid foods and drinks that seem to cause discomfort or aggravate your ulcer.  Only take medicines as directed by your caregiver. Do not substitute over-the-counter medicines for prescription medicines without talking to your caregiver.  Keep any follow-up appointments and tests as directed. SEEK MEDICAL CARE IF:   Your do not improve within 7 days of starting treatment.  You have ongoing indigestion or heartburn. SEEK IMMEDIATE MEDICAL CARE IF:   You have sudden, sharp, or persistent abdominal pain.  You have bloody or dark black, tarry stools.  You vomit blood or vomit that looks like coffee grounds.  You become light-headed, weak, or feel faint.  You become sweaty or clammy. MAKE SURE YOU:   Understand these instructions.  Will watch your condition.  Will get help right away if you are not doing well or get worse. Document Released: 12/21/1999 Document Revised: 05/09/2013 Document Reviewed: 07/23/2011 [facility] Patient Information 2015 [place], [place]. This information is not intended to replace advice given to you by your health care provider. Make sure you discuss any questions you have with your health care provider.

## 2014-03-20 DIAGNOSIS — M5410 Radiculopathy, site unspecified: Secondary | ICD-10-CM | POA: Diagnosis not present

## 2014-03-23 DIAGNOSIS — H25812 Combined forms of age-related cataract, left eye: Secondary | ICD-10-CM | POA: Insufficient documentation

## 2014-03-23 DIAGNOSIS — Z87891 Personal history of nicotine dependence: Secondary | ICD-10-CM | POA: Diagnosis not present

## 2014-03-23 DIAGNOSIS — H52222 Regular astigmatism, left eye: Secondary | ICD-10-CM | POA: Insufficient documentation

## 2014-03-30 DIAGNOSIS — M5416 Radiculopathy, lumbar region: Secondary | ICD-10-CM | POA: Diagnosis not present

## 2014-03-31 ENCOUNTER — Other Ambulatory Visit: Payer: Self-pay | Admitting: Sports Medicine

## 2014-04-11 ENCOUNTER — Ambulatory Visit (INDEPENDENT_AMBULATORY_CARE_PROVIDER_SITE_OTHER): Payer: Medicare Other | Admitting: Sports Medicine

## 2014-04-11 ENCOUNTER — Encounter: Payer: Self-pay | Admitting: Sports Medicine

## 2014-04-11 VITALS — BP 136/79 | HR 71 | Wt 205.0 lb

## 2014-04-11 DIAGNOSIS — K279 Peptic ulcer, site unspecified, unspecified as acute or chronic, without hemorrhage or perforation: Secondary | ICD-10-CM | POA: Diagnosis not present

## 2014-04-11 NOTE — Assessment & Plan Note (Signed)
Symptoms have resolved after Prevpac, patient did have a positive H. pylori.

## 2014-04-11 NOTE — Progress Notes (Signed)
  Subjective:    CC: follow-up  HPI: H. Pylori peptic ulcer disease: All symptoms are now resolved after Prevpac. No further GI medicines needed.  Lumbar degenerative disc disease: Initially did well with formal physical therapy and treatment by another provider, Dr. Drema Dallas with [facility]. He is now having recurrence of pain, continues to be worked up by Dr. Drema Dallas who obtained a new MRI, that showed multiple disc protrusions expectedly.  He's not sure whether to go through another session of physical therapy, or proceed straight to injection. I have recommended that he do physical therapy, and he'll discuss this further with Dr. Drema Dallas.  Past medical history, Surgical history, Family history not pertinant except as noted below, Social history, Allergies, and medications have been entered into the medical record, reviewed, and no changes needed.   Review of Systems: No fevers, chills, night sweats, weight loss, chest pain, or shortness of breath.   Objective:    General: Well Developed, well nourished, and in no acute distress.  Neuro: Alert and oriented x3, extra-ocular muscles intact, sensation grossly intact.  HEENT: Normocephalic, atraumatic, pupils equal round reactive to light, neck supple, no masses, no lymphadenopathy, thyroid nonpalpable.  Skin: Warm and dry, no rashes. Cardiac: Regular rate and rhythm, no murmurs rubs or gallops, no lower extremity edema.  Respiratory: Clear to auscultation bilaterally. Not using accessory muscles, speaking in full sentences.  Impression and Recommendations:

## 2014-04-12 DIAGNOSIS — M5032 Other cervical disc degeneration, mid-cervical region: Secondary | ICD-10-CM | POA: Diagnosis not present

## 2014-04-12 DIAGNOSIS — M5136 Other intervertebral disc degeneration, lumbar region: Secondary | ICD-10-CM | POA: Diagnosis not present

## 2014-04-17 DIAGNOSIS — Z833 Family history of diabetes mellitus: Secondary | ICD-10-CM | POA: Diagnosis not present

## 2014-04-17 DIAGNOSIS — N12 Tubulo-interstitial nephritis, not specified as acute or chronic: Secondary | ICD-10-CM | POA: Insufficient documentation

## 2014-04-17 DIAGNOSIS — R6520 Severe sepsis without septic shock: Secondary | ICD-10-CM | POA: Diagnosis not present

## 2014-04-17 DIAGNOSIS — N10 Acute tubulo-interstitial nephritis: Secondary | ICD-10-CM | POA: Diagnosis not present

## 2014-04-17 DIAGNOSIS — M549 Dorsalgia, unspecified: Secondary | ICD-10-CM | POA: Diagnosis not present

## 2014-04-17 DIAGNOSIS — M479 Spondylosis, unspecified: Secondary | ICD-10-CM | POA: Diagnosis present

## 2014-04-17 DIAGNOSIS — A419 Sepsis, unspecified organism: Secondary | ICD-10-CM | POA: Diagnosis not present

## 2014-04-17 DIAGNOSIS — B961 Klebsiella pneumoniae [K. pneumoniae] as the cause of diseases classified elsewhere: Secondary | ICD-10-CM | POA: Diagnosis not present

## 2014-04-17 DIAGNOSIS — I4510 Unspecified right bundle-branch block: Secondary | ICD-10-CM | POA: Diagnosis not present

## 2014-04-17 DIAGNOSIS — E872 Acidosis: Secondary | ICD-10-CM | POA: Diagnosis not present

## 2014-04-17 DIAGNOSIS — N390 Urinary tract infection, site not specified: Secondary | ICD-10-CM | POA: Diagnosis not present

## 2014-04-17 DIAGNOSIS — M5136 Other intervertebral disc degeneration, lumbar region: Secondary | ICD-10-CM | POA: Diagnosis present

## 2014-04-17 DIAGNOSIS — A4159 Other Gram-negative sepsis: Secondary | ICD-10-CM | POA: Diagnosis present

## 2014-04-17 DIAGNOSIS — M5430 Sciatica, unspecified side: Secondary | ICD-10-CM | POA: Diagnosis not present

## 2014-04-17 DIAGNOSIS — Z87891 Personal history of nicotine dependence: Secondary | ICD-10-CM | POA: Diagnosis not present

## 2014-04-17 DIAGNOSIS — G8929 Other chronic pain: Secondary | ICD-10-CM | POA: Diagnosis not present

## 2014-04-17 DIAGNOSIS — R531 Weakness: Secondary | ICD-10-CM | POA: Diagnosis not present

## 2014-04-17 DIAGNOSIS — R51 Headache: Secondary | ICD-10-CM | POA: Diagnosis not present

## 2014-04-17 DIAGNOSIS — A4189 Other specified sepsis: Secondary | ICD-10-CM | POA: Diagnosis not present

## 2014-04-17 DIAGNOSIS — R509 Fever, unspecified: Secondary | ICD-10-CM | POA: Diagnosis not present

## 2014-04-17 DIAGNOSIS — Z800 Family history of malignant neoplasm of digestive organs: Secondary | ICD-10-CM | POA: Diagnosis not present

## 2014-04-17 DIAGNOSIS — R404 Transient alteration of awareness: Secondary | ICD-10-CM | POA: Diagnosis not present

## 2014-04-17 DIAGNOSIS — N3000 Acute cystitis without hematuria: Secondary | ICD-10-CM | POA: Diagnosis not present

## 2014-04-20 DIAGNOSIS — A498 Other bacterial infections of unspecified site: Secondary | ICD-10-CM | POA: Insufficient documentation

## 2014-04-28 DIAGNOSIS — R339 Retention of urine, unspecified: Secondary | ICD-10-CM | POA: Diagnosis not present

## 2014-04-28 DIAGNOSIS — N411 Chronic prostatitis: Secondary | ICD-10-CM | POA: Diagnosis not present

## 2014-05-11 ENCOUNTER — Encounter: Payer: Self-pay | Admitting: Sports Medicine

## 2014-05-11 ENCOUNTER — Ambulatory Visit (INDEPENDENT_AMBULATORY_CARE_PROVIDER_SITE_OTHER): Payer: Medicare Other | Admitting: Sports Medicine

## 2014-05-11 VITALS — BP 145/90 | HR 65 | Ht 71.0 in | Wt 206.0 lb

## 2014-05-11 DIAGNOSIS — N410 Acute prostatitis: Secondary | ICD-10-CM | POA: Diagnosis not present

## 2014-05-11 DIAGNOSIS — N419 Inflammatory disease of prostate, unspecified: Secondary | ICD-10-CM | POA: Insufficient documentation

## 2014-05-11 MED ORDER — CIPROFLOXACIN HCL 750 MG PO TABS: 750.0000 mg | ORAL_TABLET | Freq: Every day | ORAL | Status: DC

## 2014-05-11 NOTE — Assessment & Plan Note (Signed)
Did have Klebsiella bacteremia. Renal imaging was negative. His already finished 14 days of antibiotics, adding an additional 14 days of Cipro 750, once a day to complete a 28 day course. His back pain has also resolved. Is having some obstructive uropathy type symptoms, if no resolution in 2 weeks we can double his Flomax dose.

## 2014-05-11 NOTE — Progress Notes (Signed)
  Subjective:    CC: Hospital follow-up  HPI: This pleasant 69 year old male was in the hospital for a couple of days, he experienced some urinary frequency, dysuria, leading to shortness of breath and high fevers, he was taken to the emergency department where initial labs did show evidence of pyuria, as well as Klebsiella bacteremia. He was on antibiotics for 2 days in the hospital, seen by urology, and discharged on ciprofloxacin, and has had a total of 14 days of antibiotics so far. At this point all of his symptoms are gone including his back pain.  Past medical history, Surgical history, Family history not pertinant except as noted below, Social history, Allergies, and medications have been entered into the medical record, reviewed, and no changes needed.   Review of Systems: No fevers, chills, night sweats, weight loss, chest pain, or shortness of breath.   Objective:    General: Well Developed, well nourished, and in no acute distress.  Neuro: Alert and oriented x3, extra-ocular muscles intact, sensation grossly intact.  HEENT: Normocephalic, atraumatic, pupils equal round reactive to light, neck supple, no masses, no lymphadenopathy, thyroid nonpalpable.  Skin: Warm and dry, no rashes. Cardiac: Regular rate and rhythm, no murmurs rubs or gallops, no lower extremity edema.  Respiratory: Clear to auscultation bilaterally. Not using accessory muscles, speaking in full sentences. Abdomen: Soft, nontender, nondistended, no rebound tenderness, no guarding, no rigidity, no costovertebral angle pain.  Impression and Recommendations:

## 2014-06-12 ENCOUNTER — Other Ambulatory Visit: Payer: Self-pay | Admitting: Sports Medicine

## 2014-09-25 ENCOUNTER — Encounter: Payer: Self-pay | Admitting: Sports Medicine

## 2014-09-25 ENCOUNTER — Ambulatory Visit (INDEPENDENT_AMBULATORY_CARE_PROVIDER_SITE_OTHER): Payer: Medicare Other

## 2014-09-25 ENCOUNTER — Ambulatory Visit (INDEPENDENT_AMBULATORY_CARE_PROVIDER_SITE_OTHER): Payer: Medicare Other | Admitting: Sports Medicine

## 2014-09-25 DIAGNOSIS — M25511 Pain in right shoulder: Secondary | ICD-10-CM | POA: Diagnosis not present

## 2014-09-25 NOTE — Assessment & Plan Note (Signed)
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 Formal physical therapy, x-ray, patient declines any NSAIDs. Return in 4 weeks, interventional treatment if no better.

## 2014-09-25 NOTE — Progress Notes (Signed)
  Subjective:    CC: Right shoulder pain  HPI: This is a pleasant 69 year old male, for sometime he said pain that he localizes over the deltoid of his right shoulder, worse with reaching across his chest, overhead activities, throwing up all. Moderate, persistent without radiation, no recent trauma however he recalls the pain starting after trying to lift a piece of wood sheeting over his head.  Past medical history, Surgical history, Family history not pertinant except as noted below, Social history, Allergies, and medications have been entered into the medical record, reviewed, and no changes needed.   Review of Systems: No fevers, chills, night sweats, weight loss, chest pain, or shortness of breath.   Objective:    General: Well Developed, well nourished, and in no acute distress.  Neuro: Alert and oriented x3, extra-ocular muscles intact, sensation grossly intact.  HEENT: Normocephalic, atraumatic, pupils equal round reactive to light, neck supple, no masses, no lymphadenopathy, thyroid nonpalpable.  Skin: Warm and dry, no rashes. Cardiac: Regular rate and rhythm, no murmurs rubs or gallops, no lower extremity edema.  Respiratory: Clear to auscultation bilaterally. Not using accessory muscles, speaking in full sentences. Right Shoulder: Inspection reveals no abnormalities, atrophy or asymmetry. Palpation is normal with no tenderness over AC joint or bicipital groove. ROM is full in all planes. Rotator cuff strength normal throughout. Positive Neer and Hawkin's tests, empty can. Speeds and Yergason's tests normal. No labral pathology noted with negative Obrien's, negative crank, negative clunk, and good stability. Normal scapular function observed. No painful arc and no drop arm sign. No apprehension sign  Impression and Recommendations:

## 2014-10-05 ENCOUNTER — Ambulatory Visit (INDEPENDENT_AMBULATORY_CARE_PROVIDER_SITE_OTHER): Payer: Medicare Other | Admitting: Physical Therapy

## 2014-10-05 ENCOUNTER — Encounter: Payer: Self-pay | Admitting: Physical Therapy

## 2014-10-05 DIAGNOSIS — M25511 Pain in right shoulder: Secondary | ICD-10-CM | POA: Diagnosis not present

## 2014-10-05 DIAGNOSIS — R293 Abnormal posture: Secondary | ICD-10-CM

## 2014-10-05 DIAGNOSIS — R29898 Other symptoms and signs involving the musculoskeletal system: Secondary | ICD-10-CM | POA: Diagnosis not present

## 2014-10-05 NOTE — Therapy (Signed)
[place], [place], [address] Phone: [phone]   Fax:  [phone]  Physical Therapy Evaluation  Patient Details  Name: Jason Abbott MRN: [id number] Date of Birth: 26-Aug-1945 Referring Gergory Biello:  Silverio Decamp,*  Encounter Date: 10/05/2014      PT End of Session - 10/05/14 1627    Visit Number 1   Number of Visits 8   Date for PT Re-Evaluation 11/02/14   PT Start Time 1628   PT Stop Time 1711   PT Time Calculation (min) 43 min      Past Medical History  Diagnosis Date  . H/O urinary infection   . Bowel obstruction   . 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 Hernia repair    . Orif finger / thumb fracture    . Meckel diverticulum excision      There were no vitals filed for this visit.  Visit Diagnosis:  Pain in joint, shoulder region, right - Plan: PT plan of care cert/re-cert  Weakness of shoulder - Plan: PT plan of care cert/re-cert  Abnormal posture - Plan: PT plan of care cert/re-cert      Subjective Assessment - 10/05/14 1636    Subjective Pt reports he wishes to retlurn to playing softball and throwing a ball, has pain with changing from wind up to throwing, and performing fastball pitch. He feels he initailly hurt his shoulder building his shed about two years ago.  More recently it has flared up. His wife made an appontment for him because he was complaining about it.    Pertinent History has had one injection one with initial injury   Diagnostic tests x-ray arthritis   Patient Stated Goals play softball, bowling and shot pool.    Currently in Pain? No/denies  only with certain activities            [facility] PT Assessment - 10/05/14 0001    Assessment   Medical Diagnosis Rt shoulder pain   Onset Date/Surgical Date 10/04/12   Hand Dominance Right   Next MD Visit one  month   Prior Therapy none   Precautions   Precautions None   Balance Screen   Has the patient fallen in the past 6 months No   Has the patient had a decrease in activity level because of a fear of falling?  No   Is the patient reluctant to leave their home because of a fear of falling?  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    Cervical Flexion WNL   Cervical Extension WNL   Cervical - Right Rotation 40  with pain    Cervical - Left Rotation WNL   Strength   Overall Strength Comments low traps 4/5, mid traps 5-/5   Strength Assessment  Site Shoulder   Right/Left Shoulder Right;Left   Right Shoulder Flexion 5/5   Right Shoulder ABduction 5/5   Right Shoulder Internal Rotation 5/5   Right Shoulder External Rotation 4+/5   Left Shoulder Flexion 5/5   Left Shoulder ABduction 5/5   Left Shoulder Internal Rotation 5/5   Left Shoulder External Rotation 5/5   Flexibility   Soft Tissue Assessment /Muscle Length yes  bilat pecs tight   Palpation   Palpation comment hypomobile Rt scapula, tight bands in pecs   Special Tests    Special Tests Rotator Cuff Impingement   Rotator Cuff Impingment tests Michel Bickers test   Hawkins-Kennedy test   Findings Positive   Side Right                   [facility] Adult PT Treatment/Exercise - 10/05/14 0001    Exercises   Exercises Shoulder   Shoulder  Exercises: Prone   Other Prone Exercises prone T's and Y's 2x10   Shoulder Exercises: Stretch   Other Shoulder Stretches doorway stretch elbows low and mid x 45sec                PT Education - 10/05/14 1705    Education provided Yes   Education Details HEP   Person(s) Educated Patient   Methods Explanation;Demonstration   Comprehension Returned demonstration;Verbalized understanding             PT Long Term Goals - 10/05/14 1805    PT LONG TERM GOAL #1   Title I with advanced HEP ( 11/02/14)    Time 4   Period Weeks   Status New   PT LONG TERM GOAL #2   Title report no pain in the Rt shoulder with reaching behind ( 11/02/14)    Time 4   Period Weeks   Status New   PT LONG TERM GOAL #3   Title improve strength low traps =/> 4+/5 ( 11/02/14)    Time 4   Period Weeks   Status New   PT LONG TERM GOAL #4   Title be able to throw a softball/baseball without Rt shoulder pain ( 11/02/14)    Time 4   Period Weeks   Status New   PT LONG TERM GOAL #5   Title improve FOTO =/< FJ level 27% limited ( 11/02/14)    Time 4   Period Weeks               Plan - 10/05/14 1801    Clinical Impression Statement 69 yo male presents with high level Rt shoulder impairments.  He has postural changes and some weakness in the shoulder complex.  These lead to improper body mechanics while throwing balls and attempting to bowl.     Pt will benefit from skilled therapeutic intervention in order to improve on the following deficits Postural dysfunction;Decreased strength;Hypomobility;Improper body mechanics;Pain;Impaired UE functional use   Rehab Potential Good   PT Frequency 2x / week   PT Duration 4 weeks   PT Treatment/Interventions Ultrasound;Patient/family education;Dry needling;Cryotherapy;Electrical Stimulation;Moist Heat;Therapeutic exercise;Manual techniques;Vasopneumatic Device   PT Next Visit Plan scapular stab, ER ther ex   Consulted and Agree with Plan of Care  Patient         Problem List Patient Active Problem List   Diagnosis Date Noted  . Prostatitis with Urosepsis 05/11/2014  . Peptic ulcer disease 02/23/2014  . Shingles outbreak 10/03/2013  . Right shoulder pain 10/03/2013    Jeral Pinch PT 10/05/2014,  [place], [place], [address] Phone: [phone]   Fax:  [phone]

## 2014-10-05 NOTE — Patient Instructions (Addendum)
Scapula Adduction With Pectorals, Low   Stand in doorframe with palms against frame and arms at 45. Lean forward and squeeze shoulder blades. Hold _30-45__ seconds. Repeat _1__ times per session. Do _2__ sessions per day.  Scapula Adduction With Pectorals, Mid-Range   Stand in doorframe with palms against frame and arms at 90. Lean forward and squeeze shoulder blades. Hold _30-45__ seconds. Repeat _1__ times per session. Do _2__ sessions per day.  Scapular Retraction (Prone)  Lie with arms above head. Pinch shoulder blades together. Repeat __10__ times per set. Do _2-3___ sets per session. Do _1___ sessions per day.  Scapular Retraction: Abduction (Prone)  Lie with upper arms straight out from sides, elbows bent to 90. Pinch shoulder blades together and raise arms a few inches from floor. Repeat __10__ times per set. Do _2-3___ sets per session. Do __1__ sessions per day.

## 2014-10-09 ENCOUNTER — Encounter: Payer: Medicare Other | Admitting: Physical Therapy

## 2014-10-12 ENCOUNTER — Ambulatory Visit (INDEPENDENT_AMBULATORY_CARE_PROVIDER_SITE_OTHER): Payer: Medicare Other | Admitting: Physical Therapy

## 2014-10-12 DIAGNOSIS — R293 Abnormal posture: Secondary | ICD-10-CM | POA: Diagnosis not present

## 2014-10-12 DIAGNOSIS — R29898 Other symptoms and signs involving the musculoskeletal system: Secondary | ICD-10-CM

## 2014-10-12 NOTE — Therapy (Signed)
[facility]  [place], [place], [address] Phone: [phone]   Fax:  [phone]  Physical Therapy Treatment  Patient Details  Name: Jason Abbott MRN: [id number] Date of Birth: 19-Jan-1945 Referring Provider:  Silverio Decamp,*  Encounter Date: 10/12/2014      PT End of Session - 10/12/14 1541    Visit Number 2   Number of Visits 8   Date for PT Re-Evaluation 11/02/14   PT Start Time 1540   PT Stop Time 1622   PT Time Calculation (min) 42 min   Activity Tolerance Patient tolerated treatment well      Past Medical History  Diagnosis Date  . H/O urinary infection   . Bowel obstruction   . 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 Hernia repair    . Orif finger / thumb fracture    . Meckel diverticulum excision      There were no vitals filed for this visit.  Visit Diagnosis:  No diagnosis found.      Subjective Assessment - 10/12/14 1542    Subjective Pt    Diagnostic tests x-ray arthritis   Patient Stated Goals play softball, bowling and shot pool.    Currently in Pain? No/denies  pain with only certain positions (reaching)            [facility] PT Assessment - 10/12/14 0001    Assessment   Medical Diagnosis Rt shoulder pain   Onset Date/Surgical Date 10/04/12   Hand Dominance Right   AROM   Cervical - Right Rotation 67  pain at end range   Cervical - Left Rotation 60   Strength   Right/Left Shoulder Right   Right Shoulder Flexion 5/5   Right Shoulder ABduction 5/5   Right Shoulder Internal Rotation 5/5   Right Shoulder External Rotation 5/5           [facility] Adult PT Treatment/Exercise - 10/12/14 0001    Shoulder Exercises: Prone   Flexion Both;10 reps   Other Prone Exercises Goal post arms with axial extension x 15 tactile cues   Other Prone Exercises Prone T's with 2# x 10,  (initially 5 reps without wts correcting form with tactile cues), modified childs pose x 25 sec x 3 reps    Shoulder Exercises: Standing   External Rotation Right;20 reps;Theraband   Theraband Level (Shoulder External Rotation) Level 2 (Red);Level 3 (Green)  (10 each color)   Row Right;15 reps;Theraband   Theraband Level (Shoulder Row) Level 3 (Green)   Other Standing Exercises Ball throws to [employer] (1# weighted ball) x 10 throw, slight twinge in ant Rt shoulder.    Shoulder Exercises: ROM/Strengthening   UBE (Upper Arm Bike) L2: 1 min each direction up to 4 min    Shoulder Exercises: Stretch   Other Shoulder Stretches doorway stretch elbows low and mid x 45sec  (modified to unilateral for improved form)   Manual Therapy   Manual Therapy Soft tissue mobilization   Soft tissue mobilization Edge tool used to assist in soft tissue mobilization to Rt upper trap, Rt cervical paraspinals, Rt anterior shoulder - to decrease pain and decrease connective tissue restrictions.                 PT Education - 10/12/14 1739    Education provided Yes   Education Details HEP - added shoulder ER  and row with green band    Person(s) Educated Patient   Methods Explanation;Handout   Comprehension Returned demonstration;Verbalized understanding             PT Long Term Goals - 10/05/14 1805    PT LONG TERM GOAL #1   Title I with advanced HEP ( 11/02/14)    Time 4   Period Weeks   Status New   PT LONG TERM GOAL #2   Title report no pain in the Rt shoulder with reaching behind ( 11/02/14)    Time 4   Period Weeks   Status New   PT LONG TERM GOAL #3   Title improve strength low traps =/> 4+/5 ( 11/02/14)    Time 4   Period Weeks   Status New   PT LONG TERM GOAL #4   Title be able to throw a softball/baseball without Rt shoulder pain ( 11/02/14)    Time 4   Period Weeks   Status New   PT LONG TERM GOAL #5   Title improve FOTO =/< FJ level 27% limited ( 11/02/14)    Time 4    Period Weeks               Problem List Patient Active Problem List   Diagnosis Date Noted  . Prostatitis with Urosepsis 05/11/2014  . Peptic ulcer disease 02/23/2014  . Shingles outbreak 10/03/2013  . Right shoulder pain 10/03/2013    Kerin Perna, PTA 10/12/2014 5:40 PM  [facility] [place], [place], [address] Phone: [phone]   Fax:  [phone]

## 2014-10-16 ENCOUNTER — Encounter: Payer: Medicare Other | Admitting: Physical Therapy

## 2014-10-20 ENCOUNTER — Ambulatory Visit (INDEPENDENT_AMBULATORY_CARE_PROVIDER_SITE_OTHER): Payer: Medicare Other | Admitting: Physical Therapy

## 2014-10-20 DIAGNOSIS — R29898 Other symptoms and signs involving the musculoskeletal system: Secondary | ICD-10-CM

## 2014-10-20 DIAGNOSIS — R293 Abnormal posture: Secondary | ICD-10-CM | POA: Diagnosis not present

## 2014-10-20 NOTE — Therapy (Addendum)
[place], [place], [address] Phone: [phone]   Fax:  [phone]  Physical Therapy Treatment  Patient Details  Name: Jason Abbott MRN: [id number] Date of Birth: 10-07-1945 No Data Recorded  Encounter Date: 10/20/2014      PT End of Session - 10/20/14 1534    Visit Number 3   Number of Visits 8   Date for PT Re-Evaluation 11/02/14   PT Start Time 6962   PT Stop Time 1617   PT Time Calculation (min) 44 min      Past Medical History  Diagnosis Date  . H/O urinary infection   . Bowel obstruction   . 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 Hernia repair    . Orif finger / thumb fracture    . Meckel diverticulum excision      There were no vitals filed for this visit.  Visit Diagnosis:  Weakness of shoulder  Abnormal posture      Subjective Assessment - 10/20/14 1534    Subjective Pt reports he feels about the same as last session.  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    Currently in Pain? No/denies            [facility] PT Assessment - 10/20/14 0001    Assessment   Medical Diagnosis Rt shoulder pain   Onset Date/Surgical Date 10/04/12   Hand Dominance Right   AROM   Cervical - Right Rotation 70   Cervical - Left Rotation 65  with pain at end range   Strength   Right/Left Shoulder Right   Right Shoulder Flexion 5/5   Right Shoulder Extension 5/5   Right Shoulder ABduction --  5-/5   Right Shoulder Internal Rotation 5/5   Right Shoulder External Rotation 5/5     Lt lower trap 4/5 Rt lower trap 4+/5        [facility] Adult PT Treatment/Exercise - 10/20/14 0001    Shoulder Exercises: Prone   Flexion Right;Left;10 reps  2 sets   Other Prone Exercises Goal post position x 10 reps    Shoulder Exercises: Standing   External Rotation Right;20  reps;Theraband   Theraband Level (Shoulder External Rotation) Level 3 (Green)   Internal Rotation Right;10 reps;Theraband  2 sets   Theraband Level (Shoulder Internal Rotation) Level 3 (Green)   Flexion Strengthening;Right;10 reps;Theraband  2 sets   Theraband Level (Shoulder Flexion) Level 3 (Green)   ABduction Right;10 reps;Weights  2 sets, in front of mirror   Shoulder ABduction Weight (lbs) 4#, 3#    Row Right;10 reps;Theraband  2 sets   Theraband Level (Shoulder Row) Level 3 (Green)   Other Standing Exercises Ball throws to rebounder (1# weighted ball) x 10 throw, slight twinge in ant Rt shoulder.    Shoulder Exercises: ROM/Strengthening   UBE (Upper Arm Bike) L4: 1 min each direction up to 4 min    Shoulder Exercises: Stretch   Other Shoulder Stretches doorway stretch elbows low and mid x 45sec  (modified to unilateral for improved form)   Other Shoulder Stretches upper trap stretch, levator stretch x 30 sec x 2 reps each direction, Rt tricep stretch,  IR stretch                 PT Education - 10/20/14 1622    Education provided  Yes   Education Details HEP- added shoulder scaption with light resistance, 2-3 sets of 10   Person(s) Educated Patient   Methods Explanation;Demonstration  pt declined handout.    Comprehension Returned demonstration;Verbalized understanding             PT Long Term Goals - 10/20/14 1605    PT LONG TERM GOAL #1   Title I with advanced HEP ( 11/02/14)    Time 4   Period Weeks   Status On-going   PT LONG TERM GOAL #2   Title report no pain in the Rt shoulder with reaching behind ( 11/02/14)    Time 4   Period Weeks   Status On-going   PT LONG TERM GOAL #3   Title improve strength low traps =/> 4+/5 ( 11/02/14)    Time 4   Period Weeks   Status Achieved   PT LONG TERM GOAL #4   Title be able to throw a softball/baseball without Rt shoulder pain ( 11/02/14)    Time 4   Period Weeks   Status Partially Met   PT LONG TERM  GOAL #5   Title improve FOTO =/< FJ level 27% limited ( 11/02/14)    Time 4   Period Weeks   Status On-going               Plan - 10/20/14 1600    Clinical Impression Statement Pt demo improved cervical rotation; still has pain at end range. Pt demo some strength deficits in Rt shoulder abd; added exercise to existing HEP.  Pt able to throw small weighted ball with only one small "twinge".  Progressing well towards goals.    Pt will benefit from skilled therapeutic intervention in order to improve on the following deficits Postural dysfunction;Decreased strength;Hypomobility;Improper body mechanics;Pain;Impaired UE functional use   Rehab Potential Good   PT Frequency 2x / week   PT Duration 4 weeks   PT Treatment/Interventions Ultrasound;Patient/family education;Dry needling;Cryotherapy;Electrical Stimulation;Moist Heat;Therapeutic exercise;Manual techniques;Vasopneumatic Device   PT Next Visit Plan Continue progressive scapular stabilization.    Consulted and Agree with Plan of Care Patient        Problem List Patient Active Problem List   Diagnosis Date Noted  . Prostatitis with Urosepsis 05/11/2014  . Peptic ulcer disease 02/23/2014  . Shingles outbreak 10/03/2013  . Right shoulder pain 10/03/2013   Kerin Perna, PTA 10/20/2014 4:26 PM  [place], [place], [address] Phone: [phone]   Fax:  [phone]  Name: Jason Abbott MRN: [id number] Date of Birth: 07-18-1945    PHYSICAL THERAPY DISCHARGE SUMMARY  Visits from Start of Care: 3  Current functional level related to goals / functional outcomes: unknown   Remaining deficits: unknown   Education / Equipment: HEP Plan:                                                    Patient goals were partially met. Patient is being discharged due to not returning since the last visit.  ?????       Jeral Pinch,  PT 12/19/2014 11:33 AM

## 2014-10-27 ENCOUNTER — Encounter: Payer: Medicare Other | Admitting: Physical Therapy

## 2014-12-21 ENCOUNTER — Emergency Department (INDEPENDENT_AMBULATORY_CARE_PROVIDER_SITE_OTHER)
Admission: EM | Admit: 2014-12-21 | Discharge: 2014-12-21 | Disposition: A | Discharge status: Home / Self Care | Payer: Medicare Other | Source: Home / Self Care | Attending: Family Medicine | Admitting: Family Medicine

## 2014-12-21 ENCOUNTER — Encounter: Payer: Self-pay | Admitting: *Deleted

## 2014-12-21 DIAGNOSIS — B9789 Other viral agents as the cause of diseases classified elsewhere: Principal | ICD-10-CM

## 2014-12-21 DIAGNOSIS — J069 Acute upper respiratory infection, unspecified: Secondary | ICD-10-CM | POA: Diagnosis not present

## 2014-12-21 LAB — POCT RAPID STREP A (OFFICE): RAPID STREP A SCREEN: NEGATIVE

## 2014-12-21 MED ORDER — AZITHROMYCIN 250 MG PO TABS: ORAL_TABLET | ORAL | Status: DC

## 2014-12-21 MED ORDER — BENZONATATE 200 MG PO CAPS: 200.0000 mg | ORAL_CAPSULE | Freq: Every day | ORAL | Status: DC

## 2014-12-21 NOTE — ED Provider Notes (Signed)
CSN: [id number]     Arrival date & time 12/21/14  1010 History   First MD Initiated Contact with Patient 12/21/14 1037     Chief Complaint  Patient presents with  . Sore Throat  . Cough      HPI Comments: Patient complains of three day history of typical cold-like symptoms including mild sore throat, sinus congestion, headache, fatigue, and cough.   The history is provided by the patient.    Past Medical History  Diagnosis Date  . H/O urinary infection   . Bowel obstruction ([place])   . 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 Hernia repair    . Orif finger / thumb fracture    . Meckel diverticulum excision     Family History  Problem Relation Age of Onset  . Diabetes Sister   . Cancer Mother     pancreatic  . Diabetes Brother   . Diabetes Brother    Social History  Substance Use Topics  . Smoking status: Former Smoker -- 0.50 packs/day for 23 years    Quit date: 03/05/1988  . Smokeless tobacco: None  . Alcohol Use: 3.6 oz/week    6 Cans of beer per week    Review of Systems + sore throat + cough No pleuritic pain No wheezing + nasal congestion + post-nasal drainage No sinus pain/pressure No itchy/red eyes No earache No hemoptysis No SOB No fever/chills No nausea No vomiting No abdominal pain No diarrhea No urinary symptoms No skin rash + fatigue + myalgias + headache Used OTC meds without relief  Allergies  Review of patient's allergies indicates no known allergies.  Home Medications   Prior to Admission medications   Medication Sig Start Date End Date Taking? Authorizing Provider  azithromycin (ZITHROMAX Z-PAK) 250 MG tablet Take 2 tabs today; then begin one tab once daily for 4 more days. (Rx void after 12/29/14) 12/21/14   Kandra Nicolas, MD  benzonatate (TESSALON) 200 MG capsule Take 1 capsule (200 mg total) by mouth at bedtime. Take as needed for cough  12/21/14   Kandra Nicolas, MD   Meds Ordered and Administered this Visit  Medications - No data to display  BP 142/87 mmHg  Pulse 64  Temp(Src) 97.7 F (36.5 C) (Oral)  Resp 16  Wt 206 lb (93.441 kg)  SpO2 96% No data found.   Physical Exam Nursing notes and Vital Signs reviewed. Appearance:  Patient appears stated age, and in no acute distress Eyes:  Pupils are equal, round, and reactive to light and accomodation.  Extraocular movement is intact.  Conjunctivae are not inflamed  Ears:  Canals normal.  Tympanic membranes normal.  Nose:  Mildly congested turbinates.  No sinus tenderness.    Pharynx:  Normal Neck:  Supple.   Tender enlarged posterior nodes are palpated bilaterally  Lungs:  Clear to auscultation.  Breath sounds are equal.  Moving air well. Heart:  Regular rate and rhythm without murmurs, rubs, or gallops.  Abdomen:  Nontender without masses or hepatosplenomegaly.  Bowel sounds are present.  No CVA or flank tenderness.  Extremities:  No edema.  Skin:  No rash present.   ED Course  Procedures  None    Labs Reviewed  POCT RAPID STREP A (OFFICE) negative      MDM   1. Viral URI with cough    There is no evidence of bacterial infection today.  Treat symptomatically for now  Prescription written for Benzonatate (Tessalon) to take at bedtime for night-time cough.  Take plain guaifenesin (1200mg  extended release tabs such as Mucinex) twice daily, with plenty of water, for cough and congestion.  May add Pseudoephedrine (30mg , one or two every 4 to 6 hours) for sinus congestion.  Get adequate rest.   May use Afrin nasal spray (or generic oxymetazoline) twice daily for about 5 days and then discontinue.  Also recommend using saline nasal spray several times daily and saline nasal irrigation (AYR is a common brand).  Try warm salt water gargles for sore throat.  Stop all antihistamines for now, and other non-prescription cough/cold preparations. May take Ibuprofen  200mg , 4 tabs every 8 hours with food for sore throat, headache, etc. Begin Azithromycin if not improving about one week or if persistent fever develops (Given a prescription to hold, with an expiration date)  Follow-up with family doctor if not improving about10 days.     Kandra Nicolas, MD 12/21/14 1101

## 2014-12-21 NOTE — Discharge Instructions (Signed)
Take plain guaifenesin (1200mg  extended release tabs such as Mucinex) twice daily, with plenty of water, for cough and congestion.  May add Pseudoephedrine (30mg , one or two every 4 to 6 hours) for sinus congestion.  Get adequate rest.   May use Afrin nasal spray (or generic oxymetazoline) twice daily for about 5 days and then discontinue.  Also recommend using saline nasal spray several times daily and saline nasal irrigation (AYR is a common brand).  Try warm salt water gargles for sore throat.  Stop all antihistamines for now, and other non-prescription cough/cold preparations. May take Ibuprofen 200mg , 4 tabs every 8 hours with food for sore throat, headache, etc. Begin Azithromycin if not improving about one week or if persistent fever develops   Follow-up with family doctor if not improving about10 days.

## 2014-12-21 NOTE — ED Notes (Signed)
Pt c/o 3 days of sore throat, nasal congestion and cough. Taken Mucinex otc. Afebrile.

## 2015-01-25 ENCOUNTER — Ambulatory Visit (INDEPENDENT_AMBULATORY_CARE_PROVIDER_SITE_OTHER): Payer: Medicare Other | Admitting: Sports Medicine

## 2015-01-25 ENCOUNTER — Ambulatory Visit (INDEPENDENT_AMBULATORY_CARE_PROVIDER_SITE_OTHER): Payer: Medicare Other

## 2015-01-25 VITALS — BP 143/85 | HR 59 | Temp 97.6°F | Resp 16 | Wt 209.5 lb

## 2015-01-25 DIAGNOSIS — J209 Acute bronchitis, unspecified: Secondary | ICD-10-CM | POA: Insufficient documentation

## 2015-01-25 DIAGNOSIS — R05 Cough: Secondary | ICD-10-CM

## 2015-01-25 DIAGNOSIS — R059 Cough, unspecified: Secondary | ICD-10-CM

## 2015-01-25 NOTE — Assessment & Plan Note (Signed)
Persistent for 4 weeks, despite antibiotics. This likely represent postinfectious cough with a contribution from acid reflux. Declines reflux treatment today by have recommended that he do some ranitidine at bedtime. I am going to obtain a chest x-ray to complete the workup.

## 2015-01-25 NOTE — Progress Notes (Signed)
  Subjective:    CC: cough  HPI: Patient presents with a month long history of cough. He reports becoming sick with a "cold" 1 month ago. He went to the urgent care ~1 week after onset of symptoms and was Rx a Z-pack and tessalon pearls. He reports taking both medications with relief of all of his symptoms (sore throat, runny nose, fatigue, and cough) except the cough. The cough has not worsened or changed in character. It is mildly productive with clear mucus. He says that it is worst in the morning. He also says that he has had a morning only cough for a long time now as well. He denies any heart burn or change in diet. He denies any fever, SOB, CP, palpitations, abdominal pain, or fatigue. Patient denied the flu vaccine this year.  Past medical history, Surgical history, Family history not pertinant except as noted below, Social history, Allergies, and medications have been entered into the medical record, reviewed, and no changes needed.   Review of Systems: No chills, night sweats, or weight loss.   Objective:    General: Well Developed, well nourished, and in no acute distress.  Neuro: Alert and oriented x3, extra-ocular muscles intact, sensation grossly intact.  HEENT: Normocephalic, atraumatic, pupils equal round reactive to light, neck supple, no masses, no lymphadenopathy, thyroid nonpalpable. Nasal crusting. Post nasal drip noted. Ears clear bilaterally with no signs of infection. Skin: Warm and dry, no rashes. Cardiac: Regular rate and rhythm, no murmurs rubs or gallops, no lower extremity edema.  Respiratory: Clear to auscultation bilaterally. Not using accessory muscles, speaking in full sentences. Abdomen: NT/ND. Nl bowel sounds.  Impression and Recommendations:    Patient's ongoing cough symptoms in the setting of a recent URI with benign lung exam is most likely a post viral cough. Given that this has been going on for 1 month without relief, we will get a CXR today to r/o a  pna. Patient's complaint of the cough being the worst in the morning along with a history of longstanding morning cough and PUD point to GERD playing some role in the presentation. Will Rx ranitidine today.

## 2015-03-16 ENCOUNTER — Encounter: Payer: Self-pay | Admitting: *Deleted

## 2015-04-17 ENCOUNTER — Emergency Department (INDEPENDENT_AMBULATORY_CARE_PROVIDER_SITE_OTHER)
Admission: EM | Admit: 2015-04-17 | Discharge: 2015-04-17 | Disposition: A | Discharge status: Home / Self Care | Payer: Medicare Other | Source: Home / Self Care | Attending: Family Medicine | Admitting: Family Medicine

## 2015-04-17 ENCOUNTER — Encounter: Payer: Self-pay | Admitting: *Deleted

## 2015-04-17 DIAGNOSIS — N390 Urinary tract infection, site not specified: Secondary | ICD-10-CM

## 2015-04-17 DIAGNOSIS — R300 Dysuria: Secondary | ICD-10-CM | POA: Diagnosis not present

## 2015-04-17 DIAGNOSIS — R059 Cough, unspecified: Secondary | ICD-10-CM

## 2015-04-17 DIAGNOSIS — R05 Cough: Secondary | ICD-10-CM | POA: Diagnosis not present

## 2015-04-17 LAB — POCT URINALYSIS DIP (MANUAL ENTRY)
Bilirubin, UA: NEGATIVE
Glucose, UA: NEGATIVE
Ketones, POC UA: NEGATIVE
Nitrite, UA: POSITIVE — AB
Protein Ur, POC: 30 — AB
Spec Grav, UA: >=1.03 (ref 1.005–1.03)
Urobilinogen, UA: 0.2 (ref 0–1)
pH, UA: 5.5 (ref 5–8)

## 2015-04-17 MED ORDER — CIPROFLOXACIN HCL 500 MG PO TABS: 500.0000 mg | ORAL_TABLET | Freq: Two times a day (BID) | ORAL | Status: DC

## 2015-04-17 NOTE — ED Notes (Signed)
Pt c/o urinary frequency and dysuria x 1 wk. Reports a hx of UTI's.  He also c/o productive cough with clear phlegm x 1 wk. Denies fever.

## 2015-04-17 NOTE — ED Provider Notes (Signed)
CSN: [id number]     Arrival date & time 04/17/15  1628 History   First MD Initiated Contact with Patient 04/17/15 1634     Chief Complaint  Patient presents with  . Urinary Frequency  . Cough   (Consider location/radiation/quality/duration/timing/severity/associated sxs/prior Treatment) HPI  The pt is a 70yo male presenting to [facility] with concern for a UTI as he has had UTIs once every 1-2 years.  Pt c/o urinary frequency, urgency and pain with urination.  He also reports mild intermittent cough with clear phlegm for about 1 week.  Denies fever, chills, n/v/d. Denies lower abdominal pain or new back pain. Per medical records, pt had hx of prostatitis but pt states he only recalls having a kidney infection, which he was admitted for last year. Does not recall getting a recommended PSA.  Denies concern for STDs.  Past Medical History  Diagnosis Date  . H/O urinary infection   . Bowel obstruction ([place])   . 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 Hernia repair    . Orif finger / thumb fracture    . Meckel diverticulum excision     Family History  Problem Relation Age of Onset  . Diabetes Sister   . Cancer Mother     pancreatic  . Diabetes Brother   . Diabetes Brother    Social History  Substance Use Topics  . Smoking status: Former Smoker -- 0.50 packs/day for 23 years    Quit date: 03/05/1988  . Smokeless tobacco: None  . Alcohol Use: 3.6 oz/week    6 Cans of beer per week    Review of Systems  Constitutional: Negative for fever and chills.  HENT: Positive for congestion. Negative for ear pain, sore throat, trouble swallowing and voice change.   Respiratory: Positive for cough. Negative for shortness of breath.   Cardiovascular: Negative for chest pain and palpitations.  Gastrointestinal: Negative for nausea, vomiting, abdominal pain and diarrhea.  Genitourinary: Positive for dysuria, urgency  and frequency. Negative for hematuria, flank pain, discharge, scrotal swelling, penile pain and testicular pain.  Musculoskeletal: Negative for myalgias, back pain and arthralgias.  Skin: Negative for rash.    Allergies  Review of patient's allergies indicates no known allergies.  Home Medications   Prior to Admission medications   Medication Sig Start Date End Date Taking? Authorizing Provider  ciprofloxacin (CIPRO) 500 MG tablet Take 1 tablet (500 mg total) by mouth 2 (two) times daily. One po bid x 7 days 04/17/15   Noland Fordyce, PA-C   Meds Ordered and Administered this Visit  Medications - No data to display  BP 154/83 mmHg  Pulse 73  Temp(Src) 98 F (36.7 C) (Oral)  Resp 16  Ht 5\' 11"  (1.803 m)  Wt 200 lb (90.719 kg)  BMI 27.91 kg/m2  SpO2 96% No data found.   Physical Exam  Constitutional: He appears well-developed and well-nourished.  HENT:  Head: Normocephalic and atraumatic.  Right Ear: Tympanic membrane normal.  Left Ear: Tympanic membrane normal.  Nose: Nose normal.  Mouth/Throat: Uvula is midline and mucous membranes are normal. Posterior oropharyngeal erythema present. No oropharyngeal exudate, posterior oropharyngeal edema or tonsillar abscesses.  Eyes: Conjunctivae are normal. No scleral icterus.  Neck: Normal range of motion.  Cardiovascular: Normal rate, regular rhythm and normal heart sounds.   Pulmonary/Chest: Effort normal and breath sounds normal. No respiratory distress. He has no wheezes. He has  no rales.  Abdominal: Soft. He exhibits no distension. There is no tenderness.  Musculoskeletal: Normal range of motion.  Neurological: He is alert.  Skin: Skin is warm and dry.  Nursing note and vitals reviewed.   ED Course  Procedures (including critical care time)  Labs Review Labs Reviewed  POCT URINALYSIS DIP (MANUAL ENTRY) - Abnormal; Notable for the following:    Clarity, UA cloudy (*)    Blood, UA trace-intact (*)    Protein Ur, POC =30  (*)    Nitrite, UA Positive (*)    Leukocytes, UA moderate (2+) (*)    All other components within normal limits  URINE CULTURE    Imaging Review No results found.   MDM   1. Dysuria   2. UTI (lower urinary tract infection)   3. Cough    Pt c/o UTI symptoms and cough. Hx of recurrent UTIs. Per medical records, hx of prostatitis. Cough likely due to viral illness vs allergies. Doubt bronchitis or pneumonia.  UA c/w UTI.  Rx: Cipro (has been on before without adverse effects) encouraged to stay well hydrated. F/u with PCP in 4-5 days if not improving, and for PSA check to see if enlarged prostate may be cause of recurrent urinary infections. Patient verbalized understanding and agreement with treatment plan.     Noland Fordyce, PA-C 04/17/15 1720

## 2015-04-17 NOTE — Discharge Instructions (Signed)
Please take antibiotics as prescribed and be sure to complete entire course even if you start to feel better to ensure infection does not come back, unless advised otherwise by a medical provider.

## 2015-04-20 ENCOUNTER — Telehealth: Payer: Self-pay | Admitting: *Deleted

## 2015-04-20 LAB — URINE CULTURE: Colony Count: >=100000

## 2015-04-26 ENCOUNTER — Ambulatory Visit (INDEPENDENT_AMBULATORY_CARE_PROVIDER_SITE_OTHER): Payer: Medicare Other | Admitting: Sports Medicine

## 2015-04-26 ENCOUNTER — Encounter: Payer: Self-pay | Admitting: Sports Medicine

## 2015-04-26 VITALS — BP 134/70 | HR 75 | Resp 18 | Wt 205.0 lb

## 2015-04-26 DIAGNOSIS — J209 Acute bronchitis, unspecified: Secondary | ICD-10-CM

## 2015-04-26 DIAGNOSIS — Z299 Encounter for prophylactic measures, unspecified: Secondary | ICD-10-CM | POA: Diagnosis not present

## 2015-04-26 DIAGNOSIS — Z0000 Encounter for general adult medical examination without abnormal findings: Secondary | ICD-10-CM | POA: Insufficient documentation

## 2015-04-26 MED ORDER — AZITHROMYCIN 250 MG PO TABS: ORAL_TABLET | ORAL | Status: DC

## 2015-04-26 MED ORDER — ZOSTER VACCINE LIVE 19400 UNT/0.65ML ~~LOC~~ SOLR: 0.6500 mL | Freq: Once | SUBCUTANEOUS | Status: DC

## 2015-04-26 NOTE — Progress Notes (Signed)
  Subjective:    CC: Coughing  HPI: This is a pleasant 70 year old male, he has no symptoms of seasonal allergies, over the past 3 weeks he's had a cough that is actually improving, but is productive of yellowish sputum's, malaise, fatigue, minimal sore throat, runny nose has improved, no shortness of breath, chest pain, rash. No constitutional symptoms with the exception of fatigue.  Symptoms are moderate, improving.  Past medical history, Surgical history, Family history not pertinant except as noted below, Social history, Allergies, and medications have been entered into the medical record, reviewed, and no changes needed.   Review of Systems: No fevers, chills, night sweats, weight loss, chest pain, or shortness of breath.   Objective:    General: Well Developed, well nourished, and in no acute distress.  Neuro: Alert and oriented x3, extra-ocular muscles intact, sensation grossly intact.  HEENT: Normocephalic, atraumatic, pupils equal round reactive to light, neck supple, no masses, no lymphadenopathy, thyroid nonpalpable. Oropharynx, nasopharynx, ear canals unremarkable. Skin: Warm and dry, no rashes. Cardiac: Regular rate and rhythm, no murmurs rubs or gallops, no lower extremity edema.  Respiratory: Clear to auscultation bilaterally. Not using accessory muscles, speaking in full sentences.  Impression and Recommendations:

## 2015-04-26 NOTE — Assessment & Plan Note (Signed)
Pneumococcal 13 vaccine, prescription written for shingles vaccine at his pharmacy

## 2015-04-26 NOTE — Assessment & Plan Note (Signed)
Symptoms now present for 3 weeks, azithromycin, return as needed.

## 2015-05-21 ENCOUNTER — Ambulatory Visit (INDEPENDENT_AMBULATORY_CARE_PROVIDER_SITE_OTHER): Payer: Medicare Other | Admitting: Sports Medicine

## 2015-05-21 VITALS — BP 124/75 | HR 67 | Resp 16 | Wt 204.5 lb

## 2015-05-21 DIAGNOSIS — M25511 Pain in right shoulder: Secondary | ICD-10-CM | POA: Diagnosis not present

## 2015-05-21 NOTE — Assessment & Plan Note (Signed)
Isolated subscapularis dysfunction, home rehabilitation exercises, return in 2-4 weeks, subcoracoid injection if no better.

## 2015-05-21 NOTE — Progress Notes (Signed)
  Subjective:    CC: Right shoulder pain  HPI: This is a pleasant 70 year old male, for the past several weeks he's had increasing pain over the anterior aspect of his right shoulder, worse with overhead activities, internal rotation. We treated him with physical therapy in the past, and he improved significantly. Pain is moderate, persistent.  Past medical history, Surgical history, Family history not pertinant except as noted below, Social history, Allergies, and medications have been entered into the medical record, reviewed, and no changes needed.   Review of Systems: No fevers, chills, night sweats, weight loss, chest pain, or shortness of breath.   Objective:    General: Well Developed, well nourished, and in no acute distress.  Neuro: Alert and oriented x3, extra-ocular muscles intact, sensation grossly intact.  HEENT: Normocephalic, atraumatic, pupils equal round reactive to light, neck supple, no masses, no lymphadenopathy, thyroid nonpalpable.  Skin: Warm and dry, no rashes. Cardiac: Regular rate and rhythm, no murmurs rubs or gallops, no lower extremity edema.  Respiratory: Clear to auscultation bilaterally. Not using accessory muscles, speaking in full sentences. Right Shoulder: Inspection reveals no abnormalities, atrophy or asymmetry. Palpation is normal with no tenderness over AC joint or bicipital groove. ROM is full in all planes. Rotator cuff strength weak to internal rotation Positive Neer and Hawkin's tests, empty can. Speeds and Yergason's tests normal. No labral pathology noted with negative Obrien's, negative crank, negative clunk, and good stability. Normal scapular function observed. No painful arc and no drop arm sign. No apprehension sign  positive lift off sign  Impression and Recommendations:

## 2015-07-06 ENCOUNTER — Encounter: Payer: Self-pay | Admitting: Emergency Medicine

## 2015-07-06 ENCOUNTER — Emergency Department (INDEPENDENT_AMBULATORY_CARE_PROVIDER_SITE_OTHER)
Admission: EM | Admit: 2015-07-06 | Discharge: 2015-07-06 | Disposition: A | Discharge status: Home / Self Care | Payer: Medicare Other | Source: Home / Self Care | Attending: Family Medicine | Admitting: Family Medicine

## 2015-07-06 DIAGNOSIS — M7022 Olecranon bursitis, left elbow: Secondary | ICD-10-CM | POA: Diagnosis not present

## 2015-07-06 DIAGNOSIS — L03114 Cellulitis of left upper limb: Secondary | ICD-10-CM

## 2015-07-06 MED ORDER — DOXYCYCLINE HYCLATE 100 MG PO CAPS: 100.0000 mg | ORAL_CAPSULE | Freq: Two times a day (BID) | ORAL | Status: DC

## 2015-07-06 NOTE — ED Notes (Signed)
Left elbow cellulitis x 2-3 days, red, warm to touch, itching and tender

## 2015-07-06 NOTE — Discharge Instructions (Signed)
Please take antibiotics as prescribed and be sure to complete entire course even if you start to feel better to ensure infection does not come back.   You may use an over the counter antihistamine such as benadryl, claritin, or zyrtec (generic forms are fine) to help with itching.  You should keep the wound clean with warm soap and water.    Cellulitis Cellulitis is an infection of the skin and the tissue under the skin. The infected area is usually red and tender. This happens most often in the arms and lower legs. HOME CARE   Take your antibiotic medicine as told. Finish the medicine even if you start to feel better.  Keep the infected arm or leg raised (elevated).  Put a warm cloth on the area up to 4 times per day.  Only take medicines as told by your doctor.  Keep all doctor visits as told. GET HELP IF:  You see red streaks on the skin coming from the infected area.  Your red area gets bigger or turns a dark color.  Your bone or joint under the infected area is painful after the skin heals.  Your infection comes back in the same area or different area.  You have a puffy (swollen) bump in the infected area.  You have new symptoms.  You have a fever. GET HELP RIGHT AWAY IF:   You feel very sleepy.  You throw up (vomit) or have watery poop (diarrhea).  You feel sick and have muscle aches and pains.   This information is not intended to replace advice given to you by your health care provider. Make sure you discuss any questions you have with your health care provider.   Document Released: 06/11/2007 Document Revised: 09/13/2014 Document Reviewed: 03/10/2011 Elsevier Interactive Patient Education [employer].

## 2015-07-06 NOTE — ED Provider Notes (Signed)
CSN: [id number]     Arrival date & time 07/06/15  [id number] History   First MD Initiated Contact with Patient 07/06/15 [phone]     Chief Complaint  Patient presents with  . Cellulitis   (Consider location/radiation/quality/duration/timing/severity/associated sxs/prior Treatment) HPI  Jason Abbott is a 70 y.o. male presenting to UC with c/o gradually worsening pain, redness, and swelling with mild to moderate itching for 2-3 days. Pt believes symptoms started with an insect bite. He has not tried anything for his symptoms at home. He notes he has hit his elbow on objects as he works a lot with his hands but denies any specific injury to his elbow. Denies fever, chills, n/v/d.    Past Medical History  Diagnosis Date  . H/O urinary infection   . Bowel obstruction ([place])   . 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 Hernia repair    . Orif finger / thumb fracture    . Meckel diverticulum excision     Family History  Problem Relation Age of Onset  . Diabetes Sister   . Cancer Mother     pancreatic  . Diabetes Brother   . Diabetes Brother    Social History  Substance Use Topics  . Smoking status: Former Smoker -- 0.50 packs/day for 23 years    Quit date: 03/05/1988  . Smokeless tobacco: None  . Alcohol Use: 3.6 oz/week    6 Cans of beer per week    Review of Systems  Constitutional: Negative for fever, chills and fatigue.  Gastrointestinal: Negative for nausea, vomiting and diarrhea.  Musculoskeletal: Positive for joint swelling and arthralgias. Negative for myalgias and back pain.  Skin: Positive for color change and wound. Negative for rash.    Allergies  Review of patient's allergies indicates no known allergies.  Home Medications   Prior to Admission medications   Medication Sig Start Date End Date Taking? Authorizing Provider  doxycycline (VIBRAMYCIN) 100 MG capsule Take 1 capsule  (100 mg total) by mouth 2 (two) times daily. One po bid x 7 days 07/06/15   Noland Fordyce, PA-C  zoster vaccine live, PF, (ZOSTAVAX) 09811 UNT/0.65ML injection Inject 19,400 Units into the skin once. 04/26/15   Silverio Decamp, MD   Meds Ordered and Administered this Visit  Medications - No data to display  BP 137/83 mmHg  Pulse 58  Temp(Src) 97.8 F (36.6 C) (Oral)  Ht 5\' 11"  (1.803 m)  Wt 207 lb (93.895 kg)  BMI 28.88 kg/m2  SpO2 94% No data found.   Physical Exam  Constitutional: He is oriented to person, place, and time. He appears well-developed and well-nourished.  HENT:  Head: Normocephalic and atraumatic.  Eyes: EOM are normal.  Neck: Normal range of motion.  Cardiovascular: Normal rate.   Pulses:      Radial pulses are 2+ on the left side.  Pulmonary/Chest: Effort normal.  Musculoskeletal: Normal range of motion. He exhibits edema and tenderness.  Left elbow: mild edema over olecranon bursa. Tender to touch. Full ROM elbow, shoulder and wrist. 5/5 grip strength.  Neurological: He is alert and oriented to person, place, and time.  Skin: Skin is warm and dry. There is erythema.  Left elbow: 2cm area of erythema and warmth over olecranon bursa.  34mm pustula in center of erythema draining scant yellow discharge.   Psychiatric: He has a normal mood and affect. His behavior is  normal.  Nursing note and vitals reviewed.   ED Course  Procedures (including critical care time)  Labs Review Labs Reviewed - No data to display  Imaging Review No results found.    MDM   1. Cellulitis of left upper extremity   2. Olecranon bursitis of left elbow    Hx and exam c/w olecranon bursitis and cellulitis of Left elbow, possibly secondary to insect bite.  Rx: Doxycycline  Home care instructions provided. Encouraged f/u in 3-4 days with his PCP, Dr. Dianah Field if not improving, sooner if worsening.  Patient verbalized understanding and agreement with treatment  plan.      Noland Fordyce, PA-C 07/06/15 [phone]

## 2015-07-07 ENCOUNTER — Telehealth: Payer: Self-pay | Admitting: Emergency Medicine

## 2015-07-07 NOTE — ED Notes (Signed)
Patient states his arm elbow have been improving.

## 2015-08-15 ENCOUNTER — Encounter: Payer: Self-pay | Admitting: Sports Medicine

## 2015-08-15 MED ORDER — DOXYCYCLINE HYCLATE 100 MG PO TABS: 100.0000 mg | ORAL_TABLET | Freq: Two times a day (BID) | ORAL | 0 refills | Status: AC

## 2015-09-19 DIAGNOSIS — Z23 Encounter for immunization: Secondary | ICD-10-CM | POA: Diagnosis not present

## 2015-09-21 ENCOUNTER — Encounter: Payer: Self-pay | Admitting: Sports Medicine

## 2015-09-24 MED ORDER — DOXYCYCLINE HYCLATE 100 MG PO TABS: 100.0000 mg | ORAL_TABLET | Freq: Two times a day (BID) | ORAL | 0 refills | Status: AC

## 2015-11-01 ENCOUNTER — Telehealth: Payer: Self-pay

## 2015-11-01 NOTE — Telephone Encounter (Signed)
Pt states he has a history of UTI and would like to know if you will treat him for his 4-5 days of strong odor and urinary frequency. Please assist.

## 2015-11-01 NOTE — Telephone Encounter (Signed)
I will but first he needs to drop off a urine specimen so that we can culture it, if I start antibiotics now we will never find out what bacteria if any was in the urine. He should come in tomorrow for a nurse visit urinalysis.

## 2015-11-05 NOTE — Telephone Encounter (Signed)
Left VM with information.  

## 2015-11-08 ENCOUNTER — Ambulatory Visit (INDEPENDENT_AMBULATORY_CARE_PROVIDER_SITE_OTHER): Payer: Medicare Other | Admitting: Sports Medicine

## 2015-11-08 VITALS — BP 150/88 | HR 64 | Temp 98.7°F

## 2015-11-08 DIAGNOSIS — R8290 Unspecified abnormal findings in urine: Secondary | ICD-10-CM | POA: Diagnosis not present

## 2015-11-08 DIAGNOSIS — R3915 Urgency of urination: Secondary | ICD-10-CM

## 2015-11-08 DIAGNOSIS — N410 Acute prostatitis: Secondary | ICD-10-CM | POA: Diagnosis not present

## 2015-11-08 LAB — CBC WITH DIFFERENTIAL/PLATELET
Basophils Absolute: 0 {cells}/uL (ref 0–200)
Basophils Relative: 0 %
Eosinophils Absolute: 180 cells/uL (ref 15–500)
Eosinophils Relative: 2 %
HCT: 46.8 % (ref 38.5–50.0)
Hemoglobin: 16.1 g/dL (ref 13.2–17.1)
Lymphocytes Relative: 54 %
Lymphs Abs: 4860 {cells}/uL — ABNORMAL HIGH (ref 850–3900)
MCH: 31.1 pg (ref 27.0–33.0)
MCHC: 34.4 g/dL (ref 32.0–36.0)
MCV: 90.3 fL (ref 80.0–100.0)
MPV: 11.9 fL (ref 7.5–12.5)
Monocytes Absolute: 810 cells/uL (ref 200–950)
Monocytes Relative: 9 %
Neutro Abs: 3150 cells/uL (ref 1500–7800)
Neutrophils Relative %: 35 %
Platelets: 124 10*3/uL — ABNORMAL LOW (ref 140–400)
RBC: 5.18 MIL/uL (ref 4.20–5.80)
RDW: 14.1 % (ref 11.0–15.0)
WBC: 9 K/uL (ref 3.8–10.8)

## 2015-11-08 LAB — COMPREHENSIVE METABOLIC PANEL
AST: 17 U/L (ref 10–35)
BUN: 12 mg/dL (ref 7–25)
CO2: 26 mmol/L (ref 20–31)
Chloride: 105 mmol/L (ref 98–110)
Creat: 1.2 mg/dL — ABNORMAL HIGH (ref 0.70–1.18)
Glucose, Bld: 96 mg/dL (ref 65–99)
Sodium: 140 mmol/L (ref 135–146)
Total Bilirubin: 0.7 mg/dL (ref 0.2–1.2)

## 2015-11-08 LAB — COMPREHENSIVE METABOLIC PANEL WITH GFR
ALT: 21 U/L (ref 9–46)
Albumin: 4.1 g/dL (ref 3.6–5.1)
Alkaline Phosphatase: 64 U/L (ref 40–115)
Calcium: 9 mg/dL (ref 8.6–10.3)
Potassium: 4.2 mmol/L (ref 3.5–5.3)
Total Protein: 6.7 g/dL (ref 6.1–8.1)

## 2015-11-08 LAB — POCT URINALYSIS DIPSTICK
Bilirubin, UA: NEGATIVE
Glucose, UA: NEGATIVE
Ketones, UA: NEGATIVE
Nitrite, UA: NEGATIVE
Protein, UA: >=300
Spec Grav, UA: >=1.03
Urobilinogen, UA: 0.2
pH, UA: 6

## 2015-11-08 MED ORDER — CIPROFLOXACIN HCL 750 MG PO TABS: 750.0000 mg | ORAL_TABLET | Freq: Two times a day (BID) | ORAL | 0 refills | Status: AC

## 2015-11-08 NOTE — Assessment & Plan Note (Signed)
Recent history of prostatitis with urosepsis just over 1 year ago, starting to have recurrent prostatitis type symptoms. Urine culture, Cipro for 14 days. I'm going to check blood cultures, CBC with differential, PSA, CMP. Return to see me in 2 weeks

## 2015-11-08 NOTE — Progress Notes (Signed)
  Subjective:    CC: Dysuria  HPI: This is a pleasant 70 year old male with a history of prostatitis and urosepsis who comes in with new onset increasing cloudiness, malodorousness, as well as dysuria. No flank pain, no constitutional symptoms. Symptoms are moderate, worsening. No pain in the knee more with stooling.  Past medical history:  Negative.  See flowsheet/record as well for more information.  Surgical history: Negative.  See flowsheet/record as well for more information.  Family history: Negative.  See flowsheet/record as well for more information.  Social history: Negative.  See flowsheet/record as well for more information.  Allergies, and medications have been entered into the medical record, reviewed, and no changes needed.   Review of Systems: No fevers, chills, night sweats, weight loss, chest pain, or shortness of breath.   Objective:    General: Well Developed, well nourished, and in no acute distress.  Neuro: Alert and oriented x3, extra-ocular muscles intact, sensation grossly intact.  HEENT: Normocephalic, atraumatic, pupils equal round reactive to light, neck supple, no masses, no lymphadenopathy, thyroid nonpalpable.  Skin: Warm and dry, no rashes. Cardiac: Regular rate and rhythm, no murmurs rubs or gallops, no lower extremity edema.  Respiratory: Clear to auscultation bilaterally. Not using accessory muscles, speaking in full sentences. Abdomen: Soft, nontender, nondistended, normal bowel sounds, no palpable masses, no guarding, rigidity, rebound tenderness, no costovertebral angle pain.  Urinalysis is positive for leukocytes and blood.  Impression and Recommendations:    Prostatitis with Urosepsis Recent history of prostatitis with urosepsis just over 1 year ago, starting to have recurrent prostatitis type symptoms. Urine culture, Cipro for 14 days. I'm going to check blood cultures, CBC with differential, PSA, CMP. Return to see me in 2 weeks

## 2015-11-09 LAB — PSA, TOTAL AND FREE
PSA, % Free: 11 % — ABNORMAL LOW (ref >25)
PSA, Free: 0.1 ng/mL
PSA, Total: 0.9 ng/mL (ref <=4.0)

## 2015-11-10 LAB — URINE CULTURE

## 2015-11-16 LAB — CULTURE, BLOOD (SINGLE): Organism ID, Bacteria: NO GROWTH

## 2016-03-20 ENCOUNTER — Telehealth: Payer: Self-pay | Admitting: Sports Medicine

## 2016-03-20 ENCOUNTER — Ambulatory Visit (INDEPENDENT_AMBULATORY_CARE_PROVIDER_SITE_OTHER): Payer: Medicare Other | Admitting: Sports Medicine

## 2016-03-20 VITALS — BP 129/83 | HR 57 | Temp 97.5°F | Ht 71.0 in | Wt 206.0 lb

## 2016-03-20 DIAGNOSIS — N410 Acute prostatitis: Secondary | ICD-10-CM | POA: Diagnosis not present

## 2016-03-20 DIAGNOSIS — R319 Hematuria, unspecified: Secondary | ICD-10-CM

## 2016-03-20 DIAGNOSIS — R300 Dysuria: Secondary | ICD-10-CM

## 2016-03-20 LAB — POCT URINALYSIS DIPSTICK
Bilirubin, UA: NEGATIVE
Glucose, UA: NEGATIVE
Ketones, UA: NEGATIVE
Nitrite, UA: POSITIVE
Protein, UA: 30
Spec Grav, UA: >1.035 — AB
Urobilinogen, UA: 0.2
pH, UA: 6.5

## 2016-03-20 MED ORDER — CIPROFLOXACIN HCL 750 MG PO TABS: 750.0000 mg | ORAL_TABLET | Freq: Two times a day (BID) | ORAL | 0 refills | Status: AC

## 2016-03-20 NOTE — Assessment & Plan Note (Signed)
History of prostatitis with urosepsis, it seems as though the nurse line called in [place]. He is here for a UA which shows nitrites, leukocytes, blood. Switching to Cipro for 14 days. Awaiting urine culture.

## 2016-03-20 NOTE — Progress Notes (Signed)
Patient was seen for nurse visit for possible UTI. Patient complains of burning and discomfort when urinating. No fever, vitals are normal. UA was performed.

## 2016-03-20 NOTE — Telephone Encounter (Signed)
Pt came in this morning stating he has a UTI. UC called the pharmacy who gave him 2 pills of Bactrim late yesterday evening. He requested that Dr. Darene Lamer please send in a prescription for him. Please advise. Thanks!

## 2016-03-20 NOTE — Telephone Encounter (Signed)
NV made for today.

## 2016-03-20 NOTE — Progress Notes (Signed)
Pt notified of rx change

## 2016-03-23 LAB — URINE CULTURE

## 2016-05-06 DIAGNOSIS — H43812 Vitreous degeneration, left eye: Secondary | ICD-10-CM | POA: Diagnosis not present

## 2016-05-06 DIAGNOSIS — H35372 Puckering of macula, left eye: Secondary | ICD-10-CM | POA: Diagnosis not present

## 2016-05-06 DIAGNOSIS — Z961 Presence of intraocular lens: Secondary | ICD-10-CM | POA: Diagnosis not present

## 2016-05-06 DIAGNOSIS — H26492 Other secondary cataract, left eye: Secondary | ICD-10-CM | POA: Diagnosis not present

## 2016-06-01 ENCOUNTER — Encounter: Payer: Self-pay | Admitting: Emergency Medicine

## 2016-06-01 ENCOUNTER — Emergency Department (INDEPENDENT_AMBULATORY_CARE_PROVIDER_SITE_OTHER)
Admission: EM | Admit: 2016-06-01 | Discharge: 2016-06-01 | Disposition: A | Discharge status: Home / Self Care | Payer: Medicare Other | Source: Home / Self Care | Attending: Family Medicine | Admitting: Family Medicine

## 2016-06-01 DIAGNOSIS — N390 Urinary tract infection, site not specified: Secondary | ICD-10-CM

## 2016-06-01 LAB — POCT URINALYSIS DIP (MANUAL ENTRY)
BILIRUBIN UA: NEGATIVE
BILIRUBIN UA: NEGATIVE mg/dL
GLUCOSE UA: NEGATIVE mg/dL
Nitrite, UA: POSITIVE — AB
Protein Ur, POC: 30 mg/dL — AB
SPEC GRAV UA: 1.025 (ref 1.010–1.025)
Urobilinogen, UA: 0.2 E.U./dL
pH, UA: 5.5 (ref 5.0–8.0)

## 2016-06-01 MED ORDER — CIPROFLOXACIN HCL 500 MG PO TABS: 500.0000 mg | ORAL_TABLET | Freq: Two times a day (BID) | ORAL | 0 refills | Status: DC

## 2016-06-01 NOTE — Discharge Instructions (Signed)
Increase fluid intake.  May continue ibuprofen. If symptoms become significantly worse during the night or over the weekend, proceed to the local emergency room.

## 2016-06-01 NOTE — ED Provider Notes (Signed)
[facility]    CSN: [id number] Arrival date & time: 06/01/16  1126     History   Chief Complaint Chief Complaint  Patient presents with  . Urinary Frequency    HPI Jason Abbott is a 71 y.o. male.   The history is provided by the patient.  Urinary Frequency  This is a recurrent problem. The current episode started yesterday. The problem occurs hourly. The problem has been gradually worsening. Pertinent negatives include no abdominal pain. Associated symptoms comments: fever. Nothing aggravates the symptoms. Nothing relieves the symptoms. Treatments tried: ibuprofen. The treatment provided no relief.    Past Medical History:  Diagnosis Date  . Bowel obstruction ([place])   . 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 H/O urinary infection     Patient Active Problem List   Diagnosis Date Noted  . Preventive measure 04/26/2015  . Acute bronchitis 01/25/2015  . Prostatitis with Urosepsis 05/11/2014  . Peptic ulcer disease 02/23/2014  . Shingles outbreak 10/03/2013  . Right shoulder pain 10/03/2013    Past Surgical History:  Procedure Laterality Date  . HERNIA REPAIR    . MECKEL DIVERTICULUM EXCISION    . ORIF FINGER / THUMB FRACTURE         Home Medications    Prior to Admission medications   Medication Sig Start Date End Date Taking? Authorizing Provider  ciprofloxacin (CIPRO) 500 MG tablet Take 1 tablet (500 mg total) by mouth 2 (two) times daily. 06/01/16   Kandra Nicolas, MD  zoster vaccine live, PF, (ZOSTAVAX) 95621 UNT/0.65ML injection Inject 19,400 Units into the skin once. 04/26/15   Silverio Decamp, MD    Family History Family History  Problem Relation Age of Onset  . Diabetes Sister   . Cancer Mother        pancreatic  . Diabetes Brother   . Diabetes Brother     Social History Social History  Substance Use Topics  . Smoking status: Former Smoker    Packs/day: 0.50    Years: 23.00   Quit date: 03/05/1988  . Smokeless tobacco: Never Used  . Alcohol use 3.6 oz/week    6 Cans of beer per week     Allergies   Patient has no known allergies.   Review of Systems Review of Systems  Constitutional: Positive for activity change, chills, diaphoresis, fatigue and fever. Negative for appetite change.  Gastrointestinal: Negative for abdominal pain.  Genitourinary: Positive for frequency.  All other systems reviewed and are negative.    Physical Exam Triage Vital Signs ED Triage Vitals [06/01/16 1202]  Enc Vitals Group     BP 138/86     Pulse Rate 67     Resp      Temp 98.5 F (36.9 C)     Temp Source Oral     SpO2 95 %     Weight 200 lb (90.7 kg)     Height 5\' 11"  (1.803 m)     Head Circumference      Peak Flow      Pain Score 0     Pain Loc      Pain Edu?      Excl. in [place]?    No data found.   Updated Vital Signs BP 138/86 (BP Location: Left Arm)   Pulse 67   Temp 98.5 F (36.9 C) (Oral)   Ht 5\' 11"  (1.803 m)   Wt 200 lb (90.7 kg)  SpO2 95%   BMI 27.89 kg/m   Visual Acuity Right Eye Distance:   Left Eye Distance:   Bilateral Distance:    Right Eye Near:   Left Eye Near:    Bilateral Near:     Physical Exam  Nursing notes and Vital Signs reviewed. Appearance:  Patient appears stated age, and in no acute distress.    Eyes:  Pupils are equal, round, and reactive to light and accomodation.  Extraocular movement is intact.  Conjunctivae are not inflamed   Pharynx:  Normal; moist mucous membranes  Neck:  Supple.  No adenopathy Lungs:  Clear to auscultation.  Breath sounds are equal.  Moving air well. Heart:  Regular rate and rhythm without murmurs, rubs, or gallops.  Abdomen:  Nontender without masses or hepatosplenomegaly.  Bowel sounds are present.  No CVA or flank tenderness.  Extremities:  No edema.  Skin:  No rash present.     UC Treatments / Results  Labs (all labs ordered are listed, but only abnormal results are displayed) Labs  Reviewed  POCT URINALYSIS DIP (MANUAL ENTRY) - Abnormal; Notable for the following:       Result Value   Color, UA other (*)    Clarity, UA cloudy (*)    Blood, UA trace-intact (*)    Protein Ur, POC =30 (*)    Nitrite, UA Positive (*)    Leukocytes, UA Moderate (2+) (*)    All other components within normal limits  URINE CULTURE    EKG  EKG Interpretation None       Radiology No results found.  Procedures Procedures (including critical care time)  Medications Ordered in UC Medications - No data to display   Initial Impression / Assessment and Plan / UC Course  I have reviewed the triage vital signs and the nursing notes.  Pertinent labs & imaging results that were available during my care of the patient were reviewed by me and considered in my medical decision making (see chart for details).    Suspect prostatitis. Urine culture pending.  Begin Cipro for 10 days. Increase fluid intake.  May continue ibuprofen. If symptoms become significantly worse during the night or over the weekend, proceed to the local emergency room.  Followup with Family Doctor if not improved in about 4 to 5 days. If UTI's begin to occur more frequently, recommend follow-up with urologist.    Final Clinical Impressions(s) / UC Diagnoses   Final diagnoses:  Urinary tract infection without hematuria, site unspecified    New Prescriptions New Prescriptions   CIPROFLOXACIN (CIPRO) 500 MG TABLET    Take 1 tablet (500 mg total) by mouth 2 (two) times daily.     Kandra Nicolas, MD 06/01/16 1251

## 2016-06-01 NOTE — ED Triage Notes (Signed)
Pt c/o frequency x 1 day, urgency, no blood, low grade fever of 100.4 yesterday, odor.

## 2016-06-03 ENCOUNTER — Encounter: Payer: Self-pay | Admitting: Sports Medicine

## 2016-06-03 ENCOUNTER — Telehealth: Payer: Self-pay

## 2016-06-03 MED ORDER — CIPROFLOXACIN HCL 750 MG PO TABS: 750.0000 mg | ORAL_TABLET | Freq: Two times a day (BID) | ORAL | 0 refills | Status: AC

## 2016-06-03 NOTE — Telephone Encounter (Signed)
Excellent, thanks. 

## 2016-06-03 NOTE — Telephone Encounter (Signed)
Spoke with patient regarding beeing seen in UC on 5/27.  Gave results of culture and pt stated that he is feeling much better.  Will follow up as needed.

## 2016-06-04 LAB — URINE CULTURE

## 2016-10-10 ENCOUNTER — Emergency Department (INDEPENDENT_AMBULATORY_CARE_PROVIDER_SITE_OTHER)
Admission: EM | Admit: 2016-10-10 | Discharge: 2016-10-10 | Disposition: A | Discharge status: Home / Self Care | Payer: Medicare Other | Source: Home / Self Care | Attending: Family Medicine | Admitting: Family Medicine

## 2016-10-10 ENCOUNTER — Encounter: Payer: Self-pay | Admitting: *Deleted

## 2016-10-10 DIAGNOSIS — R300 Dysuria: Secondary | ICD-10-CM | POA: Diagnosis not present

## 2016-10-10 LAB — POCT URINALYSIS DIP (MANUAL ENTRY)
Bilirubin, UA: NEGATIVE
Glucose, UA: NEGATIVE mg/dL
Ketones, POC UA: NEGATIVE mg/dL
Nitrite, UA: NEGATIVE
Protein Ur, POC: 100 mg/dL — AB
Spec Grav, UA: 1.025 (ref 1.010–1.025)
Urobilinogen, UA: 0.2 E.U./dL
pH, UA: 6 (ref 5.0–8.0)

## 2016-10-10 MED ORDER — CIPROFLOXACIN HCL 500 MG PO TABS: 500.0000 mg | ORAL_TABLET | Freq: Two times a day (BID) | ORAL | 0 refills | Status: DC

## 2016-10-10 NOTE — ED Triage Notes (Signed)
Patient c/o 3 days of intermittent dysuria polyuria. Afebrile.

## 2016-10-10 NOTE — ED Provider Notes (Signed)
Jason Abbott    CSN: [id number] Arrival date & time: 10/10/16  0831     History   Chief Complaint Chief Complaint  Patient presents with  . Dysuria  . Urinary Frequency    HPI Jason Abbott is a 71 y.o. male.   HPI  Jason Abbott is a 71 y.o. male presenting to UC with c/o 3 days of gradually worsening intermittent polyuria.  Denies fever, chills, n/v/d. Denies abdominal pain or back pain.  Hx of UTIs in the past, symptoms feel similar. He typically does well on Cipro.  He has not taken any OTC medications for current symptoms. Last UTI was in May 2018.   Past Medical History:  Diagnosis Date  . Bowel obstruction ([place])   . 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 H/O urinary infection     Patient Active Problem List   Diagnosis Date Noted  . Preventive measure 04/26/2015  . Acute bronchitis 01/25/2015  . Prostatitis with Urosepsis 05/11/2014  . Peptic ulcer disease 02/23/2014  . Shingles outbreak 10/03/2013  . Right shoulder pain 10/03/2013    Past Surgical History:  Procedure Laterality Date  . HERNIA REPAIR    . MECKEL DIVERTICULUM EXCISION    . ORIF FINGER / THUMB FRACTURE         Home Medications    Prior to Admission medications   Medication Sig Start Date End Date Taking? Authorizing Provider  ciprofloxacin (CIPRO) 500 MG tablet Take 1 tablet (500 mg total) by mouth 2 (two) times daily. One po bid x 7 days 10/10/16   Noe Gens, PA-C    Family History Family History  Problem Relation Age of Onset  . Diabetes Sister   . Cancer Mother        pancreatic  . Diabetes Brother   . Diabetes Brother     Social History Social History  Substance Use Topics  . Smoking status: Former Smoker    Packs/day: 0.50    Years: 23.00    Quit date: 03/05/1988  . Smokeless tobacco: Never Used  . Alcohol use 3.6 oz/week    6 Cans of beer per week     Allergies   Patient has no known  allergies.   Review of Systems Review of Systems  Constitutional: Negative for chills and fever.  Gastrointestinal: Negative for abdominal pain, diarrhea, nausea and vomiting.  Genitourinary: Positive for dysuria, frequency and urgency. Negative for decreased urine volume, flank pain, hematuria, scrotal swelling and testicular pain.  Musculoskeletal: Negative for back pain and myalgias.     Physical Exam Triage Vital Signs ED Triage Vitals  Enc Vitals Group     BP 10/10/16 0905 124/84     Pulse Rate 10/10/16 0905 70     Resp 10/10/16 0905 14     Temp 10/10/16 0905 97.8 F (36.6 C)     Temp Source 10/10/16 0905 Oral     SpO2 10/10/16 0905 95 %     Weight 10/10/16 0906 200 lb (90.7 kg)     Height --      Head Circumference --      Peak Flow --      Pain Score 10/10/16 0906 0     Pain Loc --      Pain Edu? --      Excl. in [place]? --    No data found.   Updated Vital Signs BP 124/84 (BP Location: Left  Arm)   Pulse 70   Temp 97.8 F (36.6 C) (Oral)   Resp 14   Wt 200 lb (90.7 kg)   SpO2 95%   BMI 27.89 kg/m   Visual Acuity Right Eye Distance:   Left Eye Distance:   Bilateral Distance:    Right Eye Near:   Left Eye Near:    Bilateral Near:     Physical Exam  Constitutional: He is oriented to person, place, and time. He appears well-developed and well-nourished. No distress.  HENT:  Head: Normocephalic and atraumatic.  Mouth/Throat: Oropharynx is clear and moist.  Eyes: EOM are normal.  Neck: Normal range of motion.  Cardiovascular: Normal rate and regular rhythm.   Pulmonary/Chest: Effort normal and breath sounds normal. No respiratory distress. He has no wheezes. He has no rales.  Abdominal: Soft. He exhibits no distension. There is no tenderness. There is no CVA tenderness.  Musculoskeletal: Normal range of motion.  Neurological: He is alert and oriented to person, place, and time.  Skin: Skin is warm and dry. He is not diaphoretic.  Psychiatric: He has a  normal mood and affect. His behavior is normal.  Nursing note and vitals reviewed.    UC Treatments / Results  Labs (all labs ordered are listed, but only abnormal results are displayed) Labs Reviewed  POCT URINALYSIS DIP (MANUAL ENTRY) - Abnormal; Notable for the following:       Result Value   Clarity, UA cloudy (*)    Blood, UA small (*)    Protein Ur, POC =100 (*)    Leukocytes, UA Large (3+) (*)    All other components within normal limits  URINE CULTURE    EKG  EKG Interpretation None       Radiology No results found.  Procedures Procedures (including critical Abbott time)  Medications Ordered in UC Medications - No data to display   Initial Impression / Assessment and Plan / UC Course  I have reviewed the triage vital signs and the nursing notes.  Pertinent labs & imaging results that were available during my Abbott of the patient were reviewed by me and considered in my medical decision making (see chart for details).     Hx and exam c/w UTI Urine culture sent Will start pt on Cipro (has done well with in the past) F/u with PCP in 1 week if needed.   Final Clinical Impressions(s) / UC Diagnoses   Final diagnoses:  Dysuria    New Prescriptions Discharge Medication List as of 10/10/2016  9:15 AM    START taking these medications   Details  ciprofloxacin (CIPRO) 500 MG tablet Take 1 tablet (500 mg total) by mouth 2 (two) times daily. One po bid x 7 days, Starting Fri 10/10/2016, Normal         Controlled Substance Prescriptions [place] Controlled Substance Registry consulted? Not Applicable   Tyrell Antonio 10/10/16 1610

## 2016-10-13 ENCOUNTER — Telehealth: Payer: Self-pay | Admitting: Emergency Medicine

## 2016-10-13 LAB — URINE CULTURE
MICRO NUMBER:: 81110238
SPECIMEN QUALITY:: ADEQUATE

## 2016-10-13 NOTE — Telephone Encounter (Signed)
He is getting better, be sure to finish meds, increase fluids.

## 2017-01-01 ENCOUNTER — Encounter: Payer: Self-pay | Admitting: Emergency Medicine

## 2017-01-01 ENCOUNTER — Emergency Department (INDEPENDENT_AMBULATORY_CARE_PROVIDER_SITE_OTHER)
Admission: EM | Admit: 2017-01-01 | Discharge: 2017-01-01 | Disposition: A | Discharge status: Home / Self Care | Payer: Medicare Other | Source: Home / Self Care | Attending: Family Medicine | Admitting: Family Medicine

## 2017-01-01 ENCOUNTER — Other Ambulatory Visit: Payer: Self-pay

## 2017-01-01 DIAGNOSIS — R300 Dysuria: Secondary | ICD-10-CM | POA: Diagnosis not present

## 2017-01-01 DIAGNOSIS — N390 Urinary tract infection, site not specified: Secondary | ICD-10-CM

## 2017-01-01 LAB — POCT URINALYSIS DIP (MANUAL ENTRY)
Bilirubin, UA: NEGATIVE
Glucose, UA: NEGATIVE mg/dL
Ketones, POC UA: NEGATIVE mg/dL
NITRITE UA: POSITIVE — AB
PH UA: 5.5 (ref 5.0–8.0)
PROTEIN UA: NEGATIVE mg/dL
Spec Grav, UA: >=1.03 — AB (ref 1.010–1.025)
UROBILINOGEN UA: 0.2 U/dL

## 2017-01-01 MED ORDER — AMOXICILLIN 875 MG PO TABS: 875.0000 mg | ORAL_TABLET | Freq: Two times a day (BID) | ORAL | 0 refills | Status: DC

## 2017-01-01 NOTE — ED Provider Notes (Signed)
[facility]    CSN: [id number] Arrival date & time: 01/01/17  1310     History   Chief Complaint Chief Complaint  Patient presents with  . Dysuria    HPI Jason Abbott is a 71 y.o. male.   Patient has a history of UTI's.  Yesterday afternoon he developed urgency and mild dysuria, followed by lower back ache, myalgias, chills, and fever to 100.6.  Ibuprofen improved his symptoms.   The history is provided by the patient.  Dysuria  This is a recurrent problem. The current episode started yesterday. The problem occurs constantly. The problem has been gradually worsening. Pertinent negatives include no abdominal pain. Nothing aggravates the symptoms. Nothing relieves the symptoms. Treatments tried: ibuprofen. The treatment provided moderate relief.    Past Medical History:  Diagnosis Date  . Bowel obstruction ([place])   . 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 H/O urinary infection     Patient Active Problem List   Diagnosis Date Noted  . Preventive measure 04/26/2015  . Acute bronchitis 01/25/2015  . Prostatitis with Urosepsis 05/11/2014  . Peptic ulcer disease 02/23/2014  . Shingles outbreak 10/03/2013  . Right shoulder pain 10/03/2013    Past Surgical History:  Procedure Laterality Date  . HERNIA REPAIR    . MECKEL DIVERTICULUM EXCISION    . ORIF FINGER / THUMB FRACTURE         Home Medications    Prior to Admission medications   Medication Sig Start Date End Date Taking? Authorizing Provider  ibuprofen (ADVIL,MOTRIN) 800 MG tablet Take 800 mg by mouth every 8 (eight) hours as needed.   Yes [provider]  amoxicillin (AMOXIL) 875 MG tablet Take 1 tablet (875 mg total) by mouth 2 (two) times daily. 01/01/17   Kandra Nicolas, MD    Family History Family History  Problem Relation Age of Onset  . Diabetes Sister   . Cancer Mother        pancreatic  . Diabetes Brother   . Diabetes Brother      Social History Social History   Tobacco Use  . Smoking status: Former Smoker    Packs/day: 0.50    Years: 23.00    Pack years: 11.50    Last attempt to quit: 03/05/1988    Years since quitting: 28.8  . Smokeless tobacco: Never Used  Substance Use Topics  . Alcohol use: Yes    Alcohol/week: 3.6 oz    Types: 6 Cans of beer per week  . Drug use: No     Allergies   Patient has no known allergies.   Review of Systems Review of Systems  Constitutional: Positive for chills, diaphoresis, fatigue and fever.  Gastrointestinal: Negative for abdominal pain.  Genitourinary: Positive for dysuria and urgency. Negative for discharge, flank pain, frequency, hematuria, scrotal swelling and testicular pain.  All other systems reviewed and are negative.    Physical Exam Triage Vital Signs ED Triage Vitals  Enc Vitals Group     BP 01/01/17 1334 130/73     Pulse Rate 01/01/17 1334 98     Resp --      Temp 01/01/17 1334 (!) 97.5 F (36.4 C)     Temp Source 01/01/17 1334 Oral     SpO2 01/01/17 1334 98 %     Weight 01/01/17 1335 200 lb (90.7 kg)     Height 01/01/17 1335 5\' 11"  (1.803 m)  Head Circumference --      Peak Flow --      Pain Score 01/01/17 1335 4     Pain Loc --      Pain Edu? --      Excl. in [place]? --    No data found.  Updated Vital Signs BP 130/73 (BP Location: Right Arm)   Pulse 98   Temp (!) 97.5 F (36.4 C) (Oral)   Ht 5\' 11"  (1.803 m)   Wt 200 lb (90.7 kg)   SpO2 98%   BMI 27.89 kg/m   Visual Acuity Right Eye Distance:   Left Eye Distance:   Bilateral Distance:    Right Eye Near:   Left Eye Near:    Bilateral Near:     Physical Exam Nursing notes and Vital Signs reviewed. Appearance:  Patient appears stated age, and in no acute distress.    Eyes:  Pupils are equal, round, and reactive to light and accomodation.  Extraocular movement is intact.  Conjunctivae are not inflamed   Pharynx:  Normal; moist mucous membranes  Neck:  Supple.  No  adenopathy Lungs:  Clear to auscultation.  Breath sounds are equal.  Moving air well. Heart:  Regular rate and rhythm without murmurs, rubs, or gallops.  Abdomen:  Nontender without masses or hepatosplenomegaly.  Bowel sounds are present.  No CVA or flank tenderness.  Extremities:  No edema.  Skin:  No rash present.     UC Treatments / Results  Labs (all labs ordered are listed, but only abnormal results are displayed) Labs Reviewed  POCT URINALYSIS DIP (MANUAL ENTRY) - Abnormal; Notable for the following components:      Result Value   Clarity, UA cloudy (*)    Spec Grav, UA >=1.030 (*)    Blood, UA trace-intact (*)    Nitrite, UA Positive (*)    Leukocytes, UA Moderate (2+) (*)    All other components within normal limits  URINE CULTURE    EKG  EKG Interpretation None       Radiology No results found.  Procedures Procedures (including critical care time)  Medications Ordered in UC Medications - No data to display   Initial Impression / Assessment and Plan / UC Course  I have reviewed the triage vital signs and the nursing notes.  Pertinent labs & imaging results that were available during my care of the patient were reviewed by me and considered in my medical decision making (see chart for details).    Urine culture pending.  Review of previous urine cultures:  E. Coli on 06/01/16 and enterococcus on 10/10/16. Begin amoxicillin 875mg  BID. Increase fluid intake.  May continue Ibuprofen 200mg , 4 tabs every 8 hours with food.   If symptoms become significantly worse during the night or over the weekend, proceed to the local emergency room.  Followup with Family Doctor if not improved in 7 to 10 days.    Final Clinical Impressions(s) / UC Diagnoses   Final diagnoses:  Dysuria  Urinary tract infection without hematuria, site unspecified    ED Discharge Orders        Ordered    amoxicillin (AMOXIL) 875 MG tablet  2 times daily     01/01/17 1400            Kandra Nicolas, MD 01/03/17 2037

## 2017-01-01 NOTE — Discharge Instructions (Signed)
Increase fluid intake.  May continue Ibuprofen 200mg , 4 tabs every 8 hours with food.   If symptoms become significantly worse during the night or over the weekend, proceed to the local emergency room.

## 2017-01-01 NOTE — ED Triage Notes (Signed)
Dysuria, chills, LBP started yesterday

## 2017-01-04 ENCOUNTER — Telehealth: Payer: Self-pay | Admitting: Emergency Medicine

## 2017-01-04 LAB — URINE CULTURE
MICRO NUMBER: 81452604
SPECIMEN QUALITY: ADEQUATE

## 2017-01-04 NOTE — Telephone Encounter (Signed)
Patient states that he is doing much better, symptoms have improved.  Patient will complete current antbs and follow up as needed.

## 2017-01-05 ENCOUNTER — Telehealth: Payer: Self-pay

## 2017-01-05 NOTE — Telephone Encounter (Signed)
Called and spoke with patient.  Feeling better.  Results given.

## 2017-02-01 ENCOUNTER — Encounter: Payer: Self-pay | Admitting: Emergency Medicine

## 2017-02-01 ENCOUNTER — Emergency Department (INDEPENDENT_AMBULATORY_CARE_PROVIDER_SITE_OTHER)
Admission: EM | Admit: 2017-02-01 | Discharge: 2017-02-01 | Disposition: A | Discharge status: Home / Self Care | Payer: Medicare Other | Source: Home / Self Care | Attending: Family Medicine | Admitting: Family Medicine

## 2017-02-01 DIAGNOSIS — N390 Urinary tract infection, site not specified: Secondary | ICD-10-CM | POA: Diagnosis not present

## 2017-02-01 LAB — POCT URINALYSIS DIP (MANUAL ENTRY)
Bilirubin, UA: NEGATIVE
GLUCOSE UA: NEGATIVE mg/dL
Ketones, POC UA: NEGATIVE mg/dL
Nitrite, UA: POSITIVE — AB
PH UA: 5.5 (ref 5.0–8.0)
PROTEIN UA: NEGATIVE mg/dL
RBC UA: NEGATIVE
Spec Grav, UA: 1.025 (ref 1.010–1.025)
Urobilinogen, UA: 0.2 E.U./dL

## 2017-02-01 MED ORDER — CIPROFLOXACIN HCL 500 MG PO TABS: 500.0000 mg | ORAL_TABLET | Freq: Two times a day (BID) | ORAL | 0 refills | Status: AC

## 2017-02-01 MED ORDER — TAMSULOSIN HCL 0.4 MG PO CAPS: 0.4000 mg | ORAL_CAPSULE | Freq: Every day | ORAL | 0 refills | Status: DC

## 2017-02-01 NOTE — ED Triage Notes (Signed)
Patient complaining of urgency, frequency, odorus urine, completed 10 days of Amoxil for similar sxs.

## 2017-02-01 NOTE — ED Provider Notes (Signed)
[facility]    CSN: [id number] Arrival date & time: 02/01/17  1535     History   Chief Complaint Chief Complaint  Patient presents with  . Urinary Tract Infection    HPI Jason Abbott Jason Abbott is a 72 y.o. male.   HPI Jason Abbott Jason Abbott is a 72 y.o. male presenting to UC with c/o urinary urgency, frequency, and malodorous urine that started worsening about 3-4 days ago.  He was tx with amoxicillin last month for a UTI and states he was improving but symptoms never completely resolved. Hx of recurrent UTIs but states he does best on Cipro.  He has not f/u with his PCP in almost 1 year and has not seen urology for his recurrent UTIs.  Denies hx of enlarged prostate. He had his PSA checked a few years ago and was advised it was normal.  He is not on any daily medications. Denies fever, chills, n/v/d. Denies abdominal pain or back pain.   Past Medical History:  Diagnosis Date  . Bowel obstruction ([place])   . 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 H/O urinary infection     Patient Active Problem List   Diagnosis Date Noted  . Preventive measure 04/26/2015  . Acute bronchitis 01/25/2015  . Prostatitis with Urosepsis 05/11/2014  . Peptic ulcer disease 02/23/2014  . Shingles outbreak 10/03/2013  . Right shoulder pain 10/03/2013    Past Surgical History:  Procedure Laterality Date  . HERNIA REPAIR    . MECKEL DIVERTICULUM EXCISION    . ORIF FINGER / THUMB FRACTURE         Home Medications    Prior to Admission medications   Medication Sig Start Date End Date Taking? Authorizing Provider  ciprofloxacin (CIPRO) 500 MG tablet Take 1 tablet (500 mg total) by mouth 2 (two) times daily for 10 days. 02/01/17 02/11/17  Noe Gens, PA-C  ibuprofen (ADVIL,MOTRIN) 800 MG tablet Take 800 mg by mouth every 8 (eight) hours as needed.    [provider]  tamsulosin (FLOMAX) 0.4 MG CAPS capsule Take 1 capsule (0.4 mg total) by mouth  daily. 02/01/17   Noe Gens, PA-C    Family History Family History  Problem Relation Age of Onset  . Diabetes Sister   . Cancer Mother        pancreatic  . Diabetes Brother   . Diabetes Brother     Social History Social History   Tobacco Use  . Smoking status: Former Smoker    Packs/day: 0.50    Years: 23.00    Pack years: 11.50    Last attempt to quit: 03/05/1988    Years since quitting: 28.9  . Smokeless tobacco: Never Used  Substance Use Topics  . Alcohol use: Yes    Alcohol/week: 3.6 oz    Types: 6 Cans of beer per week  . Drug use: No     Allergies   Patient has no known allergies.   Review of Systems Review of Systems  Gastrointestinal: Negative for abdominal pain, diarrhea, nausea and vomiting.  Genitourinary: Positive for dysuria, frequency and urgency. Negative for decreased urine volume, discharge, flank pain, hematuria, penile pain and testicular pain.  Musculoskeletal: Negative for back pain and myalgias.  Neurological: Negative for dizziness and light-headedness.     Physical Exam Triage Vital Signs ED Triage Vitals  Enc Vitals Group     BP 02/01/17 1617 (!) 145/81  Pulse Rate 02/01/17 1617 60     Resp --      Temp 02/01/17 1617 97.6 F (36.4 C)     Temp Source 02/01/17 1617 Oral     SpO2 02/01/17 1617 96 %     Weight 02/01/17 1618 200 lb (90.7 kg)     Height 02/01/17 1618 5\' 11"  (1.803 m)     Head Circumference --      Peak Flow --      Pain Score 02/01/17 1618 0     Pain Loc --      Pain Edu? --      Excl. in [place]? --    No data found.  Updated Vital Signs BP (!) 145/81 (BP Location: Right Arm)   Pulse 60   Temp 97.6 F (36.4 C) (Oral)   Ht 5\' 11"  (1.803 m)   Wt 200 lb (90.7 kg)   SpO2 96%   BMI 27.89 kg/m   Visual Acuity Right Eye Distance:   Left Eye Distance:   Bilateral Distance:    Right Eye Near:   Left Eye Near:    Bilateral Near:     Physical Exam  Constitutional: He is oriented to person, place, and  time. He appears well-developed and well-nourished. No distress.  HENT:  Head: Normocephalic and atraumatic.  Mouth/Throat: Oropharynx is clear and moist.  Eyes: EOM are normal.  Neck: Normal range of motion.  Cardiovascular: Normal rate and regular rhythm.  Pulmonary/Chest: Effort normal and breath sounds normal. No stridor. No respiratory distress. He has no wheezes. He has no rales.  Abdominal: Soft. He exhibits no distension. There is no tenderness. There is no CVA tenderness.  Musculoskeletal: Normal range of motion.  Neurological: He is alert and oriented to person, place, and time.  Skin: Skin is warm and dry. He is not diaphoretic.  Psychiatric: He has a normal mood and affect. His behavior is normal.  Nursing note and vitals reviewed.    UC Treatments / Results  Labs (all labs ordered are listed, but only abnormal results are displayed) Labs Reviewed  POCT URINALYSIS DIP (MANUAL ENTRY) - Abnormal; Notable for the following components:      Result Value   Nitrite, UA Positive (*)    Leukocytes, UA Small (1+) (*)    All other components within normal limits  URINE CULTURE    EKG  EKG Interpretation None       Radiology No results found.  Procedures Procedures (including critical care time)  Medications Ordered in UC Medications - No data to display   Initial Impression / Assessment and Plan / UC Course  I have reviewed the triage vital signs and the nursing notes.  Pertinent labs & imaging results that were available during my care of the patient were reviewed by me and considered in my medical decision making (see chart for details).     Hx and exam c/w UTI Culture sent Will start on Cipro 500mg  BID for 10 days. Pt has been on Cirpo for up to 14 days at a time w/o any side effects. Will also have pt start taking Flomax to see if this helps limit recurrence of symptoms. Strongly encouraged f/u with PCP and Urologist for frequent UTIs. Pt agreeable to  f/u with PCP   Final Clinical Impressions(s) / UC Diagnoses   Final diagnoses:  Recurrent UTI    ED Discharge Orders        Ordered    ciprofloxacin (CIPRO) 500 MG  tablet  2 times daily     02/01/17 1705    tamsulosin (FLOMAX) 0.4 MG CAPS capsule  Daily     02/01/17 1705       Controlled Substance Prescriptions [place] Controlled Substance Registry consulted? Not Applicable   Tyrell Antonio 02/01/17 1743

## 2017-02-03 ENCOUNTER — Telehealth: Payer: Self-pay | Admitting: *Deleted

## 2017-02-03 LAB — URINE CULTURE
MICRO NUMBER:: 90114790
SPECIMEN QUALITY:: ADEQUATE

## 2017-02-03 NOTE — Telephone Encounter (Signed)
LM with Ucx results, complete ABT and call back if he has any questions or concerns.

## 2017-03-09 DIAGNOSIS — G5620 Lesion of ulnar nerve, unspecified upper limb: Secondary | ICD-10-CM | POA: Insufficient documentation

## 2017-03-09 DIAGNOSIS — G5622 Lesion of ulnar nerve, left upper limb: Secondary | ICD-10-CM | POA: Diagnosis not present

## 2017-03-16 DIAGNOSIS — G5622 Lesion of ulnar nerve, left upper limb: Secondary | ICD-10-CM | POA: Diagnosis not present

## 2017-03-23 DIAGNOSIS — G5602 Carpal tunnel syndrome, left upper limb: Secondary | ICD-10-CM | POA: Diagnosis not present

## 2017-03-23 DIAGNOSIS — G5622 Lesion of ulnar nerve, left upper limb: Secondary | ICD-10-CM | POA: Diagnosis not present

## 2017-04-14 DIAGNOSIS — M79642 Pain in left hand: Secondary | ICD-10-CM | POA: Diagnosis not present

## 2017-04-14 DIAGNOSIS — M24522 Contracture, left elbow: Secondary | ICD-10-CM | POA: Diagnosis not present

## 2017-04-14 DIAGNOSIS — G5622 Lesion of ulnar nerve, left upper limb: Secondary | ICD-10-CM | POA: Diagnosis not present

## 2017-04-14 DIAGNOSIS — G5602 Carpal tunnel syndrome, left upper limb: Secondary | ICD-10-CM | POA: Diagnosis not present

## 2017-04-28 DIAGNOSIS — G5622 Lesion of ulnar nerve, left upper limb: Secondary | ICD-10-CM | POA: Diagnosis not present

## 2017-04-28 DIAGNOSIS — G5602 Carpal tunnel syndrome, left upper limb: Secondary | ICD-10-CM | POA: Insufficient documentation

## 2017-05-04 DIAGNOSIS — G5602 Carpal tunnel syndrome, left upper limb: Secondary | ICD-10-CM | POA: Diagnosis not present

## 2017-05-12 DIAGNOSIS — G5622 Lesion of ulnar nerve, left upper limb: Secondary | ICD-10-CM | POA: Diagnosis not present

## 2017-08-27 ENCOUNTER — Emergency Department (INDEPENDENT_AMBULATORY_CARE_PROVIDER_SITE_OTHER)
Admission: EM | Admit: 2017-08-27 | Discharge: 2017-08-27 | Disposition: A | Discharge status: Home / Self Care | Payer: Medicare Other | Source: Home / Self Care | Attending: Family Medicine | Admitting: Family Medicine

## 2017-08-27 ENCOUNTER — Other Ambulatory Visit: Payer: Self-pay

## 2017-08-27 ENCOUNTER — Encounter: Payer: Self-pay | Admitting: Emergency Medicine

## 2017-08-27 DIAGNOSIS — L03012 Cellulitis of left finger: Secondary | ICD-10-CM

## 2017-08-27 MED ORDER — DOXYCYCLINE HYCLATE 100 MG PO CAPS: 100.0000 mg | ORAL_CAPSULE | Freq: Two times a day (BID) | ORAL | 0 refills | Status: DC

## 2017-08-27 NOTE — ED Provider Notes (Signed)
[name]    CSN: [id number] Arrival date & time: 08/27/17  1419     History   Chief Complaint Chief Complaint  Patient presents with  . paronychia    HPI Jason Abbott is a 72 y.o. male.   Patient complains of pain, redness, and swelling at the tip of his left 4th finger for 4 days.  He recalls no injury.  The history is provided by the patient.  Hand Pain  This is a new problem. Episode onset: 4 days ago. The problem occurs constantly. The problem has been gradually worsening. Exacerbated by: contact. Nothing relieves the symptoms. Treatments tried: draining. The treatment provided no relief.    Past Medical History:  Diagnosis Date  . Bowel obstruction ([place])   . 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 H/O urinary infection     Patient Active Problem List   Diagnosis Date Noted  . Preventive measure 04/26/2015  . Acute bronchitis 01/25/2015  . Prostatitis with Urosepsis 05/11/2014  . Peptic ulcer disease 02/23/2014  . Shingles outbreak 10/03/2013  . Right shoulder pain 10/03/2013    Past Surgical History:  Procedure Laterality Date  . HERNIA REPAIR    . MECKEL DIVERTICULUM EXCISION    . ORIF FINGER / THUMB FRACTURE         Home Medications    Prior to Admission medications   Medication Sig Start Date End Date Taking? Authorizing Provider  doxycycline (VIBRAMYCIN) 100 MG capsule Take 1 capsule (100 mg total) by mouth 2 (two) times daily. Take with food. 08/27/17   Kandra Nicolas, MD  ibuprofen (ADVIL,MOTRIN) 800 MG tablet Take 800 mg by mouth every 8 (eight) hours as needed.    [provider]  tamsulosin (FLOMAX) 0.4 MG CAPS capsule Take 1 capsule (0.4 mg total) by mouth daily. 02/01/17   Noe Gens, PA-C    Family History Family History  Problem Relation Age of Onset  . Diabetes Sister   . Cancer Mother        pancreatic  . Diabetes Brother   . Diabetes Brother     Social  History Social History   Tobacco Use  . Smoking status: Former Smoker    Packs/day: 0.50    Years: 23.00    Pack years: 11.50    Last attempt to quit: 03/05/1988    Years since quitting: 29.4  . Smokeless tobacco: Never Used  Substance Use Topics  . Alcohol use: Yes    Alcohol/week: 6.0 standard drinks    Types: 6 Cans of beer per week  . Drug use: No     Allergies   Patient has no known allergies.   Review of Systems Review of Systems  Constitutional: Negative for chills, diaphoresis and fever.  All other systems reviewed and are negative.    Physical Exam Triage Vital Signs ED Triage Vitals  Enc Vitals Group     BP 08/27/17 1451 (!) 156/79     Pulse Rate 08/27/17 1451 (!) 57     Resp --      Temp 08/27/17 1451 97.6 F (36.4 C)     Temp Source 08/27/17 1451 Oral     SpO2 08/27/17 1451 96 %     Weight 08/27/17 1454 198 lb (89.8 kg)     Height 08/27/17 1454 5\' 11"  (1.803 m)     Head Circumference --      Peak Flow --  Pain Score 08/27/17 1453 5     Pain Loc --      Pain Edu? --      Excl. in [place]? --    No data found.  Updated Vital Signs BP (!) 156/79 (BP Location: Right Arm)   Pulse (!) 57   Temp 97.6 F (36.4 C) (Oral)   Ht 5\' 11"  (1.803 m)   Wt 89.8 kg   SpO2 96%   BMI 27.62 kg/m   Visual Acuity Right Eye Distance:   Left Eye Distance:   Bilateral Distance:    Right Eye Near:   Left Eye Near:    Bilateral Near:     Physical Exam  Constitutional: He appears well-developed and well-nourished. No distress.  HENT:  Head: Normocephalic.  Eyes: Pupils are equal, round, and reactive to light.  Cardiovascular: Normal rate.  Pulmonary/Chest: Effort normal.  Musculoskeletal:       Hands:  Left ring finger. Ulnar aspect of fingernail is erythematous, slightly swollen and tender to palpation but not fluctuant.  Neurological: He is alert.  Skin: Skin is warm and dry.  Nursing note and vitals reviewed.    UC Treatments / Results   Labs (all labs ordered are listed, but only abnormal results are displayed) Labs Reviewed - No data to display  EKG None  Radiology No results found.  Procedures Procedures (including critical care time)  Medications Ordered in UC Medications - No data to display  Initial Impression / Assessment and Plan / UC Course  I have reviewed the triage vital signs and the nursing notes.  Pertinent labs & imaging results that were available during my care of the patient were reviewed by me and considered in my medical decision making (see chart for details).    Begin doxycycline for staph coverage. Return if not improved in about 5 days.   Final Clinical Impressions(s) / UC Diagnoses   Final diagnoses:  Paronychia of left ring finger     Discharge Instructions     Soak finger in warm water 3 to 4 times daily.    ED Prescriptions    Medication Sig Dispense Auth. Provider   doxycycline (VIBRAMYCIN) 100 MG capsule Take 1 capsule (100 mg total) by mouth 2 (two) times daily. Take with food. 14 capsule Kandra Nicolas, MD         Kandra Nicolas, MD 08/27/17 [id number]

## 2017-08-27 NOTE — ED Triage Notes (Signed)
Paronychia of Left fourth finger x 3 days

## 2017-08-27 NOTE — Discharge Instructions (Addendum)
Soak finger in warm water 3 to 4 times daily.

## 2017-08-28 ENCOUNTER — Other Ambulatory Visit: Payer: Self-pay

## 2017-08-28 ENCOUNTER — Emergency Department (INDEPENDENT_AMBULATORY_CARE_PROVIDER_SITE_OTHER)
Admission: EM | Admit: 2017-08-28 | Discharge: 2017-08-28 | Disposition: A | Discharge status: Home / Self Care | Payer: Medicare Other | Source: Home / Self Care | Attending: Family Medicine | Admitting: Family Medicine

## 2017-08-28 DIAGNOSIS — L03012 Cellulitis of left finger: Secondary | ICD-10-CM

## 2017-08-28 MED ORDER — MUPIROCIN 2 % EX OINT: TOPICAL_OINTMENT | CUTANEOUS | 0 refills | Status: DC

## 2017-08-28 NOTE — ED Provider Notes (Signed)
Jason Abbott CARE    CSN: [id number] Arrival date & time: 08/28/17  1516     History   Chief Complaint Chief Complaint  Patient presents with  . Hand Pain    LT ring finger    HPI [name] Route is a 72 y.o. male.   HPI Jason Abbott is a 72 y.o. male presenting to UC with c/o worsening Left ring finger pain that started 5 days ago. He was seen yesterday at [facility] for same and started on Doxycycline. He has taken as prescribed but last night the pain was so severe he could not sleep despite taking 800mg  ibuprofen. Pain is throbbing. He had some nausea before starting the antibiotic. He believes his finger needs to be drained.    Past Medical History:  Diagnosis Date  . Bowel obstruction ([place])   . 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 H/O urinary infection     Patient Active Problem List   Diagnosis Date Noted  . Preventive measure 04/26/2015  . Acute bronchitis 01/25/2015  . Prostatitis with Urosepsis 05/11/2014  . Peptic ulcer disease 02/23/2014  . Shingles outbreak 10/03/2013  . Right shoulder pain 10/03/2013    Past Surgical History:  Procedure Laterality Date  . HERNIA REPAIR    . MECKEL DIVERTICULUM EXCISION    . ORIF FINGER / THUMB FRACTURE         Home Medications    Prior to Admission medications   Medication Sig Start Date End Date Taking? Authorizing Provider  doxycycline (VIBRAMYCIN) 100 MG capsule Take 1 capsule (100 mg total) by mouth 2 (two) times daily. Take with food. 08/27/17   Kandra Nicolas, MD  ibuprofen (ADVIL,MOTRIN) 800 MG tablet Take 800 mg by mouth every 8 (eight) hours as needed.    [provider]  mupirocin ointment (BACTROBAN) 2 % Apply to wound 3 times daily for 5 days 08/28/17   Noe Gens, PA-C  tamsulosin (FLOMAX) 0.4 MG CAPS capsule Take 1 capsule (0.4 mg total) by mouth daily. 02/01/17   Noe Gens, PA-C    Family History Family History  Problem Relation  Age of Onset  . Diabetes Sister   . Cancer Mother        pancreatic  . Diabetes Brother   . Diabetes Brother     Social History Social History   Tobacco Use  . Smoking status: Former Smoker    Packs/day: 0.50    Years: 23.00    Pack years: 11.50    Last attempt to quit: 03/05/1988    Years since quitting: 29.5  . Smokeless tobacco: Never Used  Substance Use Topics  . Alcohol use: Yes    Alcohol/week: 6.0 standard drinks    Types: 6 Cans of beer per week  . Drug use: No     Allergies   Patient has no known allergies.   Review of Systems Review of Systems  Constitutional: Negative for chills and fever.  Gastrointestinal: Positive for nausea. Negative for vomiting.  Musculoskeletal: Positive for arthralgias and joint swelling.  Skin: Positive for color change. Negative for wound.     Physical Exam Triage Vital Signs ED Triage Vitals  Enc Vitals Group     BP 08/28/17 1553 (!) 143/89     Pulse Rate 08/28/17 1553 69     Resp --      Temp 08/28/17 1553 98.2 F (36.8 C)     Temp  Source 08/28/17 1553 Oral     SpO2 08/28/17 1553 96 %     Weight 08/28/17 1554 199 lb (90.3 kg)     Height 08/28/17 1554 5\' 11"  (1.803 m)     Head Circumference --      Peak Flow --      Pain Score 08/28/17 1554 2     Pain Loc --      Pain Edu? --      Excl. in [place]? --    No data found.  Updated Vital Signs BP (!) 143/89 (BP Location: Right Arm)   Pulse 69   Temp 98.2 F (36.8 C) (Oral)   Ht 5\' 11"  (1.803 m)   Wt 199 lb (90.3 kg)   SpO2 96%   BMI 27.75 kg/m   Visual Acuity Right Eye Distance:   Left Eye Distance:   Bilateral Distance:    Right Eye Near:   Left Eye Near:    Bilateral Near:     Physical Exam  Constitutional: He is oriented to person, place, and time. He appears well-developed and well-nourished.  HENT:  Head: Normocephalic and atraumatic.  Eyes: EOM are normal.  Neck: Normal range of motion.  Cardiovascular: Normal rate.  Pulmonary/Chest: Effort  normal.  Musculoskeletal: Normal range of motion. He exhibits edema and tenderness.  Left ring finger: mild to moderate edema distal aspect. Tender. Full ROM  Neurological: He is alert and oriented to person, place, and time.  Skin: Skin is warm and dry. Capillary refill takes less than 2 seconds.  Left ring finger, distal aspect: erythematous, swelling around nailbed. No bleeding or drainage. Mild fluctuance around nailbed.   Psychiatric: He has a normal mood and affect. His behavior is normal.  Nursing note and vitals reviewed.    UC Treatments / Results  Labs (all labs ordered are listed, but only abnormal results are displayed) Labs Reviewed - No data to display  EKG None  Radiology No results found.  Procedures Incision and Drainage Date/Time: 08/28/2017 5:55 PM Performed by: Noe Gens, PA-C Authorized by: Kandra Nicolas, MD   Consent:    Consent obtained:  Verbal   Consent given by:  Patient   Risks discussed:  Bleeding, incomplete drainage, infection and pain   Alternatives discussed:  Delayed treatment Location:    Type:  Abscess   Location:  Upper extremity   Upper extremity location:  Finger   Finger location:  L ring finger Pre-procedure details:    Skin preparation:  Betadine Anesthesia (see MAR for exact dosages):    Anesthesia method:  Local infiltration   Local anesthetic:  Lidocaine 1% w/o epi Procedure type:    Complexity:  Simple Procedure details:    Incision types:  Single straight   Incision depth:  Dermal   Scalpel blade:  11   Wound management:  Probed and deloculated   Drainage:  Bloody and purulent   Drainage amount:  Moderate   Wound treatment:  Wound left open (bacitracin and bandage applied)   Packing materials:  None Post-procedure details:    Patient tolerance of procedure:  Tolerated well, no immediate complications   (including critical care time)  Medications Ordered in UC Medications - No data to display  Initial  Impression / Assessment and Plan / UC Course  I have reviewed the triage vital signs and the nursing notes.  Pertinent labs & imaging results that were available during my care of the patient were reviewed by me and considered  in my medical decision making (see chart for details).     Successful I&D of paronychia as noted above. Home instructions provided Pt declined prescription pain medication.  Final Clinical Impressions(s) / UC Diagnoses   Final diagnoses:  Paronychia of left ring finger     Discharge Instructions      You may continue to soak your finger in warm water and epson salt 2-3 times daily. You may also start using the prescribed antibiotic ointment in addition to the oral antibiotic you were prescribed yesterday.  Please follow up with family medicine or return to urgent care in 4-5 days if not improving, sooner if worsening.     ED Prescriptions    Medication Sig Dispense Auth. Provider   mupirocin ointment (BACTROBAN) 2 % Apply to wound 3 times daily for 5 days 22 g Noe Gens, PA-C     Controlled Substance Prescriptions [place] Controlled Substance Registry consulted? Not Applicable   Tyrell Antonio 08/28/17 1757

## 2017-08-28 NOTE — ED Triage Notes (Signed)
Was here yesterday for infected finger. (lt ring finger) was rx'd antibiotics. States it worse and more painful today. Wants to have it lanced and drained.

## 2017-08-28 NOTE — Discharge Instructions (Signed)
°  You may continue to soak your finger in warm water and epson salt 2-3 times daily. You may also start using the prescribed antibiotic ointment in addition to the oral antibiotic you were prescribed yesterday.  Please follow up with family medicine or return to urgent care in 4-5 days if not improving, sooner if worsening.

## 2017-12-06 ENCOUNTER — Emergency Department (INDEPENDENT_AMBULATORY_CARE_PROVIDER_SITE_OTHER)
Admission: EM | Admit: 2017-12-06 | Discharge: 2017-12-06 | Disposition: A | Discharge status: Home / Self Care | Payer: Medicare Other | Source: Home / Self Care | Attending: Family Medicine | Admitting: Family Medicine

## 2017-12-06 ENCOUNTER — Other Ambulatory Visit: Payer: Self-pay

## 2017-12-06 ENCOUNTER — Encounter: Payer: Self-pay | Admitting: Emergency Medicine

## 2017-12-06 DIAGNOSIS — N390 Urinary tract infection, site not specified: Secondary | ICD-10-CM

## 2017-12-06 LAB — POCT URINALYSIS DIP (MANUAL ENTRY)
Bilirubin, UA: NEGATIVE
Glucose, UA: NEGATIVE mg/dL
Ketones, POC UA: NEGATIVE mg/dL
Nitrite, UA: POSITIVE — AB
Protein Ur, POC: NEGATIVE mg/dL
Spec Grav, UA: 1.025 (ref 1.010–1.025)
Urobilinogen, UA: 0.2 E.U./dL
pH, UA: 7 (ref 5.0–8.0)

## 2017-12-06 MED ORDER — AMOXICILLIN 875 MG PO TABS: 875.0000 mg | ORAL_TABLET | Freq: Two times a day (BID) | ORAL | 0 refills | Status: DC

## 2017-12-06 NOTE — ED Triage Notes (Signed)
Here with possible UTI sx's. Started yesterday. Freq.urge and slight pressure; burning sensation. Denies fever,chills,n,v

## 2017-12-06 NOTE — ED Provider Notes (Signed)
[name] CARE    CSN: [id number] Arrival date & time: 12/06/17  1240     History   Chief Complaint Chief Complaint  Patient presents with  . Recurrent UTI    HPI Jason Abbott is a 72 y.o. male.   HPI Jason Abbott is a 72 y.o. male presenting to UC with c/o urinary frequency, urgency, bladder pressure, and burning sensation since yesterday. Hx of many UTIs in the past. Symptoms feel similar. Denies fever, chills, n/v/d. He has not taken anything for his symptoms yet.    Past Medical History:  Diagnosis Date  . Bowel obstruction ([place])   . 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 H/O urinary infection     Patient Active Problem List   Diagnosis Date Noted  . Preventive measure 04/26/2015  . Acute bronchitis 01/25/2015  . Prostatitis with Urosepsis 05/11/2014  . Peptic ulcer disease 02/23/2014  . Shingles outbreak 10/03/2013  . Right shoulder pain 10/03/2013    Past Surgical History:  Procedure Laterality Date  . HERNIA REPAIR    . MECKEL DIVERTICULUM EXCISION    . ORIF FINGER / THUMB FRACTURE         Home Medications    Prior to Admission medications   Medication Sig Start Date End Date Taking? Authorizing Provider  amoxicillin (AMOXIL) 875 MG tablet Take 1 tablet (875 mg total) by mouth 2 (two) times daily. 12/06/17   Noe Gens, PA-C  ibuprofen (ADVIL,MOTRIN) 800 MG tablet Take 800 mg by mouth every 8 (eight) hours as needed.    [provider]    Family History Family History  Problem Relation Age of Onset  . Diabetes Sister   . Cancer Mother        pancreatic  . Diabetes Brother   . Diabetes Brother     Social History Social History   Tobacco Use  . Smoking status: Former Smoker    Packs/day: 0.50    Years: 23.00    Pack years: 11.50    Last attempt to quit: 03/05/1988    Years since quitting: 29.7  . Smokeless tobacco: Never Used  Substance Use Topics  . Alcohol use: Yes     Alcohol/week: 6.0 standard drinks    Types: 6 Cans of beer per week  . Drug use: No     Allergies   Patient has no known allergies.   Review of Systems Review of Systems  Constitutional: Negative for chills and fever.  Gastrointestinal: Positive for abdominal pain (bladder pressure). Negative for diarrhea, nausea and vomiting.  Genitourinary: Positive for dysuria, frequency and urgency. Negative for decreased urine volume, flank pain and hematuria.  Musculoskeletal: Negative for back pain and myalgias.     Physical Exam Triage Vital Signs ED Triage Vitals  Enc Vitals Group     BP 12/06/17 1351 (!) 154/80     Pulse Rate 12/06/17 1351 84     Resp --      Temp 12/06/17 1351 98.6 F (37 C)     Temp Source 12/06/17 1351 Oral     SpO2 12/06/17 1351 95 %     Weight 12/06/17 1353 200 lb 1.9 oz (90.8 kg)     Height 12/06/17 1353 5\' 11"  (1.803 m)     Head Circumference --      Peak Flow --      Pain Score 12/06/17 1353 0     Pain Loc --  Pain Edu? --      Excl. in [place]? --    No data found.  Updated Vital Signs BP (!) 154/80 (BP Location: Left Arm)   Pulse 84   Temp 98.6 F (37 C) (Oral)   Ht 5\' 11"  (1.803 m)   Wt 200 lb 1.9 oz (90.8 kg)   SpO2 95%   BMI 27.91 kg/m   Visual Acuity Right Eye Distance:   Left Eye Distance:   Bilateral Distance:    Right Eye Near:   Left Eye Near:    Bilateral Near:     Physical Exam  Constitutional: He is oriented to person, place, and time. He appears well-developed and well-nourished.  HENT:  Head: Normocephalic and atraumatic.  Mouth/Throat: Oropharynx is clear and moist.  Eyes: EOM are normal.  Neck: Normal range of motion.  Cardiovascular: Normal rate and regular rhythm.  Pulmonary/Chest: Effort normal. No respiratory distress.  Abdominal: Soft. He exhibits no distension. There is no tenderness. There is no CVA tenderness.  Musculoskeletal: Normal range of motion.  Neurological: He is alert and oriented to  person, place, and time.  Skin: Skin is warm and dry.  Psychiatric: He has a normal mood and affect. His behavior is normal.  Nursing note and vitals reviewed.    UC Treatments / Results  Labs (all labs ordered are listed, but only abnormal results are displayed) Labs Reviewed  POCT URINALYSIS DIP (MANUAL ENTRY) - Abnormal; Notable for the following components:      Result Value   Clarity, UA cloudy (*)    Blood, UA trace-intact (*)    Nitrite, UA Positive (*)    Leukocytes, UA Moderate (2+) (*)    All other components within normal limits  POCT URINALYSIS DIP (MANUAL ENTRY)    EKG None  Radiology No results found.  Procedures Procedures (including critical care time)  Medications Ordered in UC Medications - No data to display  Initial Impression / Assessment and Plan / UC Course  I have reviewed the triage vital signs and the nursing notes.  Pertinent labs & imaging results that were available during my care of the patient were reviewed by me and considered in my medical decision making (see chart for details).    UA c/w UTI Culture sent Will start pt on amoxicillin as pt has done well with this in the past.  Home care info provided.  Final Clinical Impressions(s) / UC Diagnoses   Final diagnoses:  Recurrent UTI     Discharge Instructions     Please take your antibiotic as prescribed. A urine culture has been sent to check the severity of your urinary infection and to determine if you are on the most appropriate antibiotic. The results should come back within 2-3 days and you will be notified even if no medication change is needed.  Please stay well hydrated and follow up with your family doctor in 1 week if not improving, sooner if worsening.     ED Prescriptions    Medication Sig Dispense Auth. Provider   amoxicillin (AMOXIL) 875 MG tablet Take 1 tablet (875 mg total) by mouth 2 (two) times daily. 20 tablet Noe Gens, PA-C     Controlled  Substance Prescriptions [place] Controlled Substance Registry consulted? Not Applicable   Noe Gens, PA-C 12/06/17 1400

## 2017-12-08 ENCOUNTER — Telehealth: Payer: Self-pay | Admitting: Emergency Medicine

## 2017-12-08 LAB — URINE CULTURE
MICRO NUMBER:: 91438629
SPECIMEN QUALITY:: ADEQUATE

## 2017-12-08 NOTE — Telephone Encounter (Signed)
Feeling better, finish meds, increase water

## 2018-01-10 ENCOUNTER — Emergency Department (INDEPENDENT_AMBULATORY_CARE_PROVIDER_SITE_OTHER)
Admission: EM | Admit: 2018-01-10 | Discharge: 2018-01-10 | Disposition: A | Discharge status: Home / Self Care | Payer: Medicare Other | Source: Home / Self Care | Attending: Internal Medicine | Admitting: Internal Medicine

## 2018-01-10 DIAGNOSIS — J200 Acute bronchitis due to Mycoplasma pneumoniae: Secondary | ICD-10-CM | POA: Diagnosis not present

## 2018-01-10 MED ORDER — AZITHROMYCIN 250 MG PO TABS: 250.0000 mg | ORAL_TABLET | Freq: Every day | ORAL | 0 refills | Status: DC

## 2018-01-10 MED ORDER — HYDROCOD POLST-CPM POLST ER 10-8 MG/5ML PO SUER: 5.0000 mL | Freq: Every evening | ORAL | 0 refills | Status: DC | PRN

## 2018-01-12 ENCOUNTER — Ambulatory Visit (INDEPENDENT_AMBULATORY_CARE_PROVIDER_SITE_OTHER): Payer: Medicare Other | Admitting: Sports Medicine

## 2018-01-12 ENCOUNTER — Encounter: Payer: Self-pay | Admitting: Sports Medicine

## 2018-01-12 ENCOUNTER — Ambulatory Visit (INDEPENDENT_AMBULATORY_CARE_PROVIDER_SITE_OTHER): Payer: Medicare Other

## 2018-01-12 VITALS — BP 121/74 | HR 67 | Ht 71.0 in | Wt 199.0 lb

## 2018-01-12 DIAGNOSIS — Z125 Encounter for screening for malignant neoplasm of prostate: Secondary | ICD-10-CM | POA: Diagnosis not present

## 2018-01-12 DIAGNOSIS — L821 Other seborrheic keratosis: Secondary | ICD-10-CM | POA: Diagnosis not present

## 2018-01-12 DIAGNOSIS — J209 Acute bronchitis, unspecified: Secondary | ICD-10-CM | POA: Diagnosis not present

## 2018-01-12 DIAGNOSIS — Z0000 Encounter for general adult medical examination without abnormal findings: Secondary | ICD-10-CM | POA: Diagnosis not present

## 2018-01-12 DIAGNOSIS — G8929 Other chronic pain: Secondary | ICD-10-CM

## 2018-01-12 DIAGNOSIS — C61 Malignant neoplasm of prostate: Secondary | ICD-10-CM | POA: Diagnosis not present

## 2018-01-12 DIAGNOSIS — R05 Cough: Secondary | ICD-10-CM | POA: Diagnosis not present

## 2018-01-12 DIAGNOSIS — M25511 Pain in right shoulder: Secondary | ICD-10-CM

## 2018-01-12 DIAGNOSIS — D649 Anemia, unspecified: Secondary | ICD-10-CM

## 2018-01-12 DIAGNOSIS — M19011 Primary osteoarthritis, right shoulder: Secondary | ICD-10-CM | POA: Diagnosis not present

## 2018-01-12 DIAGNOSIS — E782 Mixed hyperlipidemia: Secondary | ICD-10-CM

## 2018-01-12 NOTE — Assessment & Plan Note (Signed)
Persistent cough after 2 weeks, he does have some coarse sounds with decreased air movement in the left base. Adding a chest x-ray for now.

## 2018-01-12 NOTE — Assessment & Plan Note (Signed)
Cryotherapy as above. 

## 2018-01-12 NOTE — Progress Notes (Signed)
Subjective:    CC: Physical exam and multiple issues  HPI: Annual physical: Due for multiple preventive measures as documented below in the assessment and plan.  Right shoulder pain: Localized over the deltoid, worse with overhead activities, moderate, persistent without radiation.  Skin rash: Localized around the bellybutton, asymptomatic.  Coughing: Present now for 2 weeks, minimally productive.  No shortness of breath or chest pain.  I reviewed the past medical history, family history, social history, surgical history, and allergies today and no changes were needed.  Please see the problem list section below in epic for further details.  Past Medical History: Past Medical History:  Diagnosis Date  . Bowel obstruction ([place])   . 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 H/O urinary infection    Past Surgical History: Past Surgical History:  Procedure Laterality Date  . HERNIA REPAIR    . MECKEL DIVERTICULUM EXCISION    . ORIF FINGER / THUMB FRACTURE     Social History: Social History   Socioeconomic History  . Marital status: Married    Spouse name: Not on file  . Number of children: Not on file  . Years of education: Not on file  . Highest education level: Not on file  Occupational History  . Not on file  Social Needs  . Financial resource strain: Not on file  . Food insecurity:    Worry: Not on file    Inability: Not on file  . Transportation needs:    Medical: Not on file    Non-medical: Not on file  Tobacco Use  . Smoking status: Former Smoker    Packs/day: 0.50    Years: 23.00    Pack years: 11.50    Last attempt to quit: 03/05/1988    Years since quitting: 29.8  . Smokeless tobacco: Never Used  Substance and Sexual Activity  . Alcohol use: Yes    Alcohol/week: 6.0 standard drinks    Types: 6 Cans of beer per week  . Drug use: No  . Sexual activity: Not on file  Lifestyle  . Physical activity:    Days per week: Not  on file    Minutes per session: Not on file  . Stress: Not on file  Relationships  . Social connections:    Talks on phone: Not on file    Gets together: Not on file    Attends religious service: Not on file    Active member of club or organization: Not on file    Attends meetings of clubs or organizations: Not on file    Relationship status: Not on file  Other Topics Concern  . Not on file  Social History Narrative  . Not on file   Family History: Family History  Problem Relation Age of Onset  . Diabetes Sister   . Cancer Mother        pancreatic  . Diabetes Brother   . Diabetes Brother    Allergies: No Known Allergies Medications: See med rec.  Review of Systems: No fevers, chills, night sweats, weight loss, chest pain, or shortness of breath.   Objective:    General: Well Developed, well nourished, and in no acute distress.  Neuro: Alert and oriented x3, extra-ocular muscles intact, sensation grossly intact. Cranial nerves II through XII are intact, motor, sensory, and coordinative functions are all intact. HEENT: Normocephalic, atraumatic, pupils equal round reactive to light, neck supple, no masses, no lymphadenopathy, thyroid nonpalpable. Oropharynx, nasopharynx, external  ear canals are unremarkable. Skin: Warm and dry, no rashes noted.  Seborrheic keratosis around the umbilicus. Cardiac: Regular rate and rhythm, no murmurs rubs or gallops.  Respiratory: Poor air movement in the left base.  Not using accessory muscles, speaking in full sentences.  Abdominal: Soft, nontender, nondistended, positive bowel sounds, no masses, no organomegaly.  Right shoulder: Inspection reveals no abnormalities, atrophy or asymmetry. Palpation is normal with no tenderness over AC joint or bicipital groove. ROM is full in all planes. Rotator cuff strength normal throughout. Positive Neer and Hawkin's tests, empty can. Speeds and Yergason's tests normal. No labral pathology noted with  negative Obrien's, negative crank, negative clunk, and good stability. Normal scapular function observed. No painful arc and no drop arm sign. No apprehension sign  Procedure: Real-time Ultrasound Guided Injection of right subacromial bursa Device: GE Logiq E  Verbal informed consent obtained.  Time-out conducted.  Noted no overlying erythema, induration, or other signs of local infection.  Skin prepped in a sterile fashion.  Local anesthesia: Topical Ethyl chloride.  With sterile technique and under real time ultrasound guidance: Noted intact supraspinatus, 25-gauge needle advanced into the bursa, injected 1 cc Kenalog 40, 1 cc lidocaine, 1 cc bupivacaine. Completed without difficulty  Pain immediately resolved suggesting accurate placement of the medication.  Advised to call if fevers/chills, erythema, induration, drainage, or persistent bleeding.  Images permanently stored and available for review in the ultrasound unit.  Impression: Technically successful ultrasound guided injection.  Chest x-ray is for the most part clear, shoulder x-ray shows glenohumeral and acromioclavicular degenerative changes.  Impression and Recommendations:    Annual physical exam Due for multiple preventive modalities. Cologuard ordered today, this will be his last colon cancer screen. 73 years old but I do think he has a lot of years ahead of him, adding a PSA. Up-to-date on pneumococcal vaccination. Declines influenza vaccination. Tdap was 3 years ago. Routine labs.  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 Now has subacromial impingement symptoms. Subacromial injection as above. Return to see me in 1 month. X-rays, rehab exercises given.  Acute bronchitis Persistent cough after 2 weeks, he does have some coarse sounds with decreased air movement in the left base. Adding a chest x-ray for now.  Seborrheic keratosis Cryotherapy as  above  I spent 40 minutes with this patient, greater than 50% was face-to-face time counseling regarding the above diagnoses, specifically separate from the time spent performing the above procedures, as well as spent in long communications and anticipatory guidance regarding the multiple diagnoses we discussed today. ___________________________________________ Gwen Her. Dianah Field, M.D., ABFM., CAQSM. Primary Care and Sports Medicine [facility]  Adjunct Professor of [employer]

## 2018-01-12 NOTE — Assessment & Plan Note (Signed)
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 Now has subacromial impingement symptoms. Subacromial injection as above. Return to see me in 1 month. X-rays, rehab exercises given.

## 2018-01-12 NOTE — Assessment & Plan Note (Addendum)
Due for multiple preventive modalities. Cologuard ordered today, this will be his last colon cancer screen. 73 years old but I do think he has a lot of years ahead of him, adding a PSA. Up-to-date on pneumococcal vaccination. Declines influenza vaccination. Tdap was 3 years ago. Routine labs.

## 2018-01-13 DIAGNOSIS — C61 Malignant neoplasm of prostate: Secondary | ICD-10-CM | POA: Diagnosis not present

## 2018-01-13 DIAGNOSIS — Z0000 Encounter for general adult medical examination without abnormal findings: Secondary | ICD-10-CM | POA: Diagnosis not present

## 2018-01-13 DIAGNOSIS — E7800 Pure hypercholesterolemia, unspecified: Secondary | ICD-10-CM | POA: Diagnosis not present

## 2018-01-13 DIAGNOSIS — D649 Anemia, unspecified: Secondary | ICD-10-CM | POA: Diagnosis not present

## 2018-01-14 LAB — LIPID PANEL W/REFLEX DIRECT LDL
Cholesterol: 163 mg/dL (ref <200)
HDL: 34 mg/dL — ABNORMAL LOW (ref >40)
LDL Cholesterol (Calc): 110 mg/dL (calc) — ABNORMAL HIGH
Non-HDL Cholesterol (Calc): 129 mg/dL (ref <130)
Total CHOL/HDL Ratio: 4.8 (calc) (ref <5.0)
Triglycerides: 94 mg/dL (ref <150)

## 2018-01-14 LAB — TSH: TSH: 1.62 mIU/L (ref 0.40–4.50)

## 2018-01-14 LAB — COMPREHENSIVE METABOLIC PANEL WITH GFR
ALT: 22 U/L (ref 9–46)
Alkaline phosphatase (APISO): 67 U/L (ref 40–115)
CO2: 28 mmol/L (ref 20–32)
Creat: 0.85 mg/dL (ref 0.70–1.18)
Glucose, Bld: 115 mg/dL — ABNORMAL HIGH (ref 65–99)
Total Protein: 6.7 g/dL (ref 6.1–8.1)

## 2018-01-14 LAB — COMPREHENSIVE METABOLIC PANEL
AG Ratio: 1.6 (calc) (ref 1.0–2.5)
AST: 16 U/L (ref 10–35)
Albumin: 4.1 g/dL (ref 3.6–5.1)
BUN: 15 mg/dL (ref 7–25)
Calcium: 9.3 mg/dL (ref 8.6–10.3)
Chloride: 103 mmol/L (ref 98–110)
Globulin: 2.6 g/dL (calc) (ref 1.9–3.7)
Potassium: 4.4 mmol/L (ref 3.5–5.3)
Sodium: 139 mmol/L (ref 135–146)
Total Bilirubin: 0.5 mg/dL (ref 0.2–1.2)

## 2018-01-14 LAB — CBC
HCT: 43.4 % (ref 38.5–50.0)
Hemoglobin: 14.7 g/dL (ref 13.2–17.1)
MCH: 30.8 pg (ref 27.0–33.0)
MCHC: 33.9 g/dL (ref 32.0–36.0)
MCV: 91 fL (ref 80.0–100.0)
MPV: 11.6 fL (ref 7.5–12.5)
Platelets: 152 10*3/uL (ref 140–400)
RBC: 4.77 10*6/uL (ref 4.20–5.80)
RDW: 12.9 % (ref 11.0–15.0)
WBC: 12.1 Thousand/uL — ABNORMAL HIGH (ref 3.8–10.8)

## 2018-01-14 LAB — HEPATITIS C ANTIBODY
Hepatitis C Ab: NONREACTIVE
SIGNAL TO CUT-OFF: 0.03 (ref <1.00)

## 2018-01-14 LAB — PSA, TOTAL AND FREE
PSA, % Free: 14 % (calc) — ABNORMAL LOW (ref >25)
PSA, Free: 0.1 ng/mL
PSA, Total: 0.7 ng/mL (ref <=4.0)

## 2018-01-19 ENCOUNTER — Encounter: Payer: Self-pay | Admitting: Sports Medicine

## 2018-01-19 ENCOUNTER — Ambulatory Visit (INDEPENDENT_AMBULATORY_CARE_PROVIDER_SITE_OTHER): Payer: Medicare Other | Admitting: Sports Medicine

## 2018-01-19 DIAGNOSIS — N410 Acute prostatitis: Secondary | ICD-10-CM | POA: Diagnosis not present

## 2018-01-19 LAB — POCT URINALYSIS DIPSTICK
Bilirubin, UA: NEGATIVE
Glucose, UA: NEGATIVE
Ketones, UA: NEGATIVE
Nitrite, UA: POSITIVE
Protein, UA: NEGATIVE
Spec Grav, UA: 1.015 (ref 1.010–1.025)
Urobilinogen, UA: 0.2 E.U./dL
pH, UA: 5.5 (ref 5.0–8.0)

## 2018-01-19 MED ORDER — TAMSULOSIN HCL 0.4 MG PO CAPS
0.4000 mg | ORAL_CAPSULE | Freq: Every day | ORAL | 3 refills | Status: DC
Start: 2018-01-19 — End: 2018-05-24

## 2018-01-19 MED ORDER — CIPROFLOXACIN HCL 750 MG PO TABS: 750.0000 mg | ORAL_TABLET | Freq: Two times a day (BID) | ORAL | 0 refills | Status: AC

## 2018-01-19 NOTE — Assessment & Plan Note (Signed)
History of prostatitis with urosepsis approximately 2 years ago. Has done well for a while, more recently had some upper respiratory symptoms and is now having significant hesitancy, mild dysuria, urgency, frequency. This likely represents a new prostatitis. He does have an upper respiratory infection and has been using some over-the-counter cold and flu medications which he will stop. Most going to culture the urine, add ciprofloxacin for 14 days, Flomax. Return to see me in 2 weeks.

## 2018-01-19 NOTE — Progress Notes (Signed)
Subjective:    CC: Difficulty urinating  HPI: Over the past few weeks this pleasant 73 year old male has had some upper respiratory symptoms, he did take some NyQuil.  More recently he developed increasing difficulty urination, weak stream, dribbling, and mild dysuria.  He does have a history of urosepsis secondary to prostatitis.  No fevers, chills.  No malodorous discharge, no acute abdominal pain.  I reviewed the past medical history, family history, social history, surgical history, and allergies today and no changes were needed.  Please see the problem list section below in epic for further details.  Past Medical History: Past Medical History:  Diagnosis Date  . Bowel obstruction ([place])   . 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 H/O urinary infection    Past Surgical History: Past Surgical History:  Procedure Laterality Date  . HERNIA REPAIR    . MECKEL DIVERTICULUM EXCISION    . ORIF FINGER / THUMB FRACTURE     Social History: Social History   Socioeconomic History  . Marital status: Married    Spouse name: Not on file  . Number of children: Not on file  . Years of education: Not on file  . Highest education level: Not on file  Occupational History  . Not on file  Social Needs  . Financial resource strain: Not on file  . Food insecurity:    Worry: Not on file    Inability: Not on file  . Transportation needs:    Medical: Not on file    Non-medical: Not on file  Tobacco Use  . Smoking status: Former Smoker    Packs/day: 0.50    Years: 23.00    Pack years: 11.50    Last attempt to quit: 03/05/1988    Years since quitting: 29.8  . Smokeless tobacco: Never Used  Substance and Sexual Activity  . Alcohol use: Yes    Alcohol/week: 6.0 standard drinks    Types: 6 Cans of beer per week  . Drug use: No  . Sexual activity: Not on file  Lifestyle  . Physical activity:    Days per week: Not on file    Minutes per session: Not on  file  . Stress: Not on file  Relationships  . Social connections:    Talks on phone: Not on file    Gets together: Not on file    Attends religious service: Not on file    Active member of club or organization: Not on file    Attends meetings of clubs or organizations: Not on file    Relationship status: Not on file  Other Topics Concern  . Not on file  Social History Narrative  . Not on file   Family History: Family History  Problem Relation Age of Onset  . Diabetes Sister   . Cancer Mother        pancreatic  . Diabetes Brother   . Diabetes Brother    Allergies: No Known Allergies Medications: See med rec.  Review of Systems: No fevers, chills, night sweats, weight loss, chest pain, or shortness of breath.   Objective:    General: Well Developed, well nourished, and in no acute distress.  Neuro: Alert and oriented x3, extra-ocular muscles intact, sensation grossly intact.  HEENT: Normocephalic, atraumatic, pupils equal round reactive to light, neck supple, no masses, no lymphadenopathy, thyroid nonpalpable.  Skin: Warm and dry, no rashes. Cardiac: Regular rate and rhythm, no murmurs rubs or gallops, no lower  extremity edema.  Respiratory: Clear to auscultation bilaterally. Not using accessory muscles, speaking in full sentences. Abdomen: Soft, nontender, nondistended, bowel sounds, no palpable masses, no guarding, rigidity, rebound tenderness.  Urinalysis is positive for nitrites and leukocytes.  Impression and Recommendations:    Prostatitis with Urosepsis History of prostatitis with urosepsis approximately 2 years ago. Has done well for a while, more recently had some upper respiratory symptoms and is now having significant hesitancy, mild dysuria, urgency, frequency. This likely represents a new prostatitis. He does have an upper respiratory infection and has been using some over-the-counter cold and flu medications which he will stop. Most going to culture the  urine, add ciprofloxacin for 14 days, Flomax. Return to see me in 2 weeks. ___________________________________________ Gwen Her. Dianah Field, M.D., ABFM., CAQSM. Primary Care and Sports Medicine [facility]  Adjunct Professor of [employer] [facility] [employer]

## 2018-01-19 NOTE — Patient Instructions (Signed)
Prostatitis  Prostatitis is swelling or inflammation of the prostate gland. The prostate is a walnut-sized gland that is involved in the production of semen. It is located below a man's bladder, in front of the rectum. There are four types of prostatitis:  Chronic nonbacterial prostatitis. This is the most common type of prostatitis. It may be associated with a viral infection or autoimmune disorder.  Acute bacterial prostatitis. This is the least common type of prostatitis. It starts quickly and is usually associated with a bladder infection, high fever, and shaking chills. It can occur at any age.  Chronic bacterial prostatitis. This type usually results from acute bacterial prostatitis that happens repeatedly (is recurrent) or has not been treated properly. It can occur in men of any age but is most common among middle-aged men whose prostate has begun to get larger. The symptoms are not as severe as symptoms caused by acute bacterial prostatitis.  Prostatodynia or chronic pelvic pain syndrome (CPPS). This type is also called pelvic floor disorder. It is associated with increased muscular tone in the pelvis surrounding the prostate. What are the causes? Bacterial prostatitis is caused by infection from bacteria. Chronic nonbacterial prostatitis may be caused by:  Urinary tract infections (UTIs).  Nerve damage.  A response by the body's disease-fighting system (autoimmune response).  Chemicals in the urine. The causes of the other types of prostatitis are usually not known. What are the signs or symptoms? Symptoms of this condition vary depending upon the type of prostatitis. If you have acute bacterial prostatitis, you may experience:  Urinary symptoms, such as: ? Painful urination. ? Burning during urination. ? Frequent and sudden urges to urinate. ? Inability to start urinating. ? A weak or interrupted stream of urine.  Vomiting.  Nausea.  Fever.  Chills.  Inability to  empty the bladder completely.  Pain in the: ? Muscles or joints. ? Lower back. ? Lower abdomen. If you have any of the other types of prostatitis, you may experience:  Urinary symptoms, such as: ? Sudden urges to urinate. ? Frequent urination. ? Difficulty starting urination. ? Weak urine stream. ? Dribbling after urination.  Discharge from the urethra. The urethra is a tube that opens at the end of the penis.  Pain in the: ? Testicles. ? Penis or tip of the penis. ? Rectum. ? Area in front of the rectum and below the scrotum (perineum).  Problems with sexual function.  Painful ejaculation.  Bloody semen. How is this diagnosed? This condition may be diagnosed based on:  A physical and medical exam.  Your symptoms.  A urine test to check for bacteria.  An exam in which a health care provider uses a finger to feel the prostate (digital rectal exam).  A test of a sample of semen.  Blood tests.  Ultrasound.  Removal of prostate tissue to be examined under a microscope (biopsy).  Tests to check how your body handles urine (urodynamic tests).  A test to look inside your bladder or urethra (cystoscopy). How is this treated? Treatment for this condition depends on the type of prostatitis. Treatment may involve:  Medicines to relieve pain or inflammation.  Medicines to help relax your muscles.  Physical therapy.  Heat therapy.  Techniques to help you control certain body functions (biofeedback).  Relaxation exercises.  Antibiotic medicine, if your condition is caused by bacteria.  Warm water baths (sitz baths). Sitz baths help with relaxing your pelvic floor muscles, which helps to relieve pressure on the prostate.   pain or inflammation.  · Medicines to help relax your muscles.  · Physical therapy.  · Heat therapy.  · Techniques to help you control certain body functions (biofeedback).  · Relaxation exercises.  · Antibiotic medicine, if your condition is caused by bacteria.  · Warm water baths (sitz baths). Sitz baths help with relaxing your pelvic floor muscles, which helps to relieve pressure on the prostate.  Follow these instructions at home:    · Take over-the-counter and prescription medicines only as told by your health care provider.  · If you were prescribed an antibiotic, take it as told by your health care provider. Do not stop taking the  antibiotic even if you start to feel better.  · If physical therapy, biofeedback, or relaxation exercises were prescribed, do exercises as instructed.  · Take sitz baths as directed by your health care provider. For a sitz bath, sit in warm water that is deep enough to cover your hips and buttocks.  · Keep all follow-up visits as told by your health care provider. This is important.  Contact a health care provider if:  · Your symptoms get worse.  · You have a fever.  Get help right away if:  · You have chills.  · You feel nauseous.  · You vomit.  · You feel light-headed or feel like you are going to faint.  · You are unable to urinate.  · You have blood or blood clots in your urine.  This information is not intended to replace advice given to you by your health care provider. Make sure you discuss any questions you have with your health care provider.  Document Released: 12/21/1999 Document Revised: 03/07/2017 Document Reviewed: 09/13/2015  Elsevier Interactive Patient Education © 2019 Elsevier Inc.

## 2018-01-21 ENCOUNTER — Encounter: Payer: Self-pay | Admitting: Sports Medicine

## 2018-01-22 NOTE — Telephone Encounter (Signed)
Try "C18.9"

## 2018-01-22 NOTE — Telephone Encounter (Signed)
Any ideas amber?  Have you gotten anything about this?

## 2018-01-22 NOTE — Telephone Encounter (Signed)
The only thing I can think of is Medicare not accepting the Z code for it.  They don't accept those codes for ANY orders.

## 2018-01-23 LAB — URINE CULTURE
MICRO NUMBER:: 54377
SPECIMEN QUALITY:: ADEQUATE

## 2018-02-09 ENCOUNTER — Ambulatory Visit: Payer: Medicare Other | Admitting: Sports Medicine

## 2018-02-13 ENCOUNTER — Encounter: Payer: Self-pay | Admitting: Sports Medicine

## 2018-02-15 ENCOUNTER — Telehealth: Payer: Self-pay | Admitting: *Deleted

## 2018-02-15 DIAGNOSIS — Z1211 Encounter for screening for malignant neoplasm of colon: Secondary | ICD-10-CM

## 2018-02-21 DIAGNOSIS — Z1211 Encounter for screening for malignant neoplasm of colon: Secondary | ICD-10-CM | POA: Diagnosis not present

## 2018-02-25 LAB — COLOGUARD: Cologuard: NEGATIVE

## 2018-03-25 ENCOUNTER — Encounter: Payer: Self-pay | Admitting: Sports Medicine

## 2018-03-25 ENCOUNTER — Other Ambulatory Visit: Payer: Self-pay

## 2018-03-25 ENCOUNTER — Ambulatory Visit (INDEPENDENT_AMBULATORY_CARE_PROVIDER_SITE_OTHER): Payer: Medicare Other | Admitting: Sports Medicine

## 2018-03-25 DIAGNOSIS — G8929 Other chronic pain: Secondary | ICD-10-CM

## 2018-03-25 DIAGNOSIS — M25511 Pain in right shoulder: Secondary | ICD-10-CM

## 2018-03-25 NOTE — Assessment & Plan Note (Signed)
Historical isolated subscapularis dysfunction that resolved in 2017. Subacromial impingement symptoms injected approximately 2-1/2 months ago. Repeat injection today due to severe pain, he does need an MRI urgently WITHIN the next 4 weeks.

## 2018-03-25 NOTE — Progress Notes (Signed)
Subjective:    CC: Right shoulder pain  HPI: Farmer continues to have severe right shoulder pain, we did an injection about 2-1/2 months ago without sufficient relief.  Symptoms are severe, persistent, localized over the deltoid without radiation.  I reviewed the past medical history, family history, social history, surgical history, and allergies today and no changes were needed.  Please see the problem list section below in epic for further details.  Past Medical History: Past Medical History:  Diagnosis Date  . Bowel obstruction ([place])   . 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 H/O urinary infection    Past Surgical History: Past Surgical History:  Procedure Laterality Date  . HERNIA REPAIR    . MECKEL DIVERTICULUM EXCISION    . ORIF FINGER / THUMB FRACTURE     Social History: Social History   Socioeconomic History  . Marital status: Married    Spouse name: Not on file  . Number of children: Not on file  . Years of education: Not on file  . Highest education level: Not on file  Occupational History  . Not on file  Social Needs  . Financial resource strain: Not on file  . Food insecurity:    Worry: Not on file    Inability: Not on file  . Transportation needs:    Medical: Not on file    Non-medical: Not on file  Tobacco Use  . Smoking status: Former Smoker    Packs/day: 0.50    Years: 23.00    Pack years: 11.50    Last attempt to quit: 03/05/1988    Years since quitting: 30.0  . Smokeless tobacco: Never Used  Substance and Sexual Activity  . Alcohol use: Yes    Alcohol/week: 6.0 standard drinks    Types: 6 Cans of beer per week  . Drug use: No  . Sexual activity: Not on file  Lifestyle  . Physical activity:    Days per week: Not on file    Minutes per session: Not on file  . Stress: Not on file  Relationships  . Social connections:    Talks on phone: Not on file    Gets together: Not on file    Attends religious  service: Not on file    Active member of club or organization: Not on file    Attends meetings of clubs or organizations: Not on file    Relationship status: Not on file  Other Topics Concern  . Not on file  Social History Narrative  . Not on file   Family History: Family History  Problem Relation Age of Onset  . Diabetes Sister   . Cancer Mother        pancreatic  . Diabetes Brother   . Diabetes Brother    Allergies: No Known Allergies Medications: See med rec.  Review of Systems: No fevers, chills, night sweats, weight loss, chest pain, or shortness of breath.   Objective:    General: Well Developed, well nourished, and in no acute distress.  Neuro: Alert and oriented x3, extra-ocular muscles intact, sensation grossly intact.  HEENT: Normocephalic, atraumatic, pupils equal round reactive to light, neck supple, no masses, no lymphadenopathy, thyroid nonpalpable.  Skin: Warm and dry, no rashes. Cardiac: Regular rate and rhythm, no murmurs rubs or gallops, no lower extremity edema.  Respiratory: Clear to auscultation bilaterally. Not using accessory muscles, speaking in full sentences. Right Shoulder: Inspection reveals no abnormalities, atrophy or asymmetry. Palpation  is normal with no tenderness over AC joint or bicipital groove. ROM is full in all planes. Rotator cuff strength normal throughout. Positive Neer and Hawkin's tests, empty can. Speeds and Yergason's tests normal. No labral pathology noted with negative Obrien's, negative crank, negative clunk, and good stability. Normal scapular function observed. No painful arc and no drop arm sign. No apprehension sign  Procedure: Real-time Ultrasound Guided injection of the right subacromial bursa Device: GE Logiq E  Verbal informed consent obtained.  Time-out conducted.  Noted no overlying erythema, induration, or other signs of local infection.  Skin prepped in a sterile fashion.  Local anesthesia: Topical Ethyl  chloride.  With sterile technique and under real time ultrasound guidance:  Noted heterogeneity of the supraspinatus, I then injected 1 cc Kenalog 40, 1 cc lidocaine, 1 cc bupivacaine into the subacromial space. Completed without difficulty  Pain immediately resolved suggesting accurate placement of the medication.  Advised to call if fevers/chills, erythema, induration, drainage, or persistent bleeding.  Images permanently stored and available for review in the ultrasound unit.  Impression: Technically successful ultrasound guided injection.  Impression and Recommendations:    Right shoulder pain Historical isolated subscapularis dysfunction that resolved in 2017. Subacromial impingement symptoms injected approximately 2-1/2 months ago. Repeat injection today due to severe pain, he does need an MRI urgently WITHIN the next 4 weeks.   ___________________________________________ Gwen Her. Dianah Field, M.D., ABFM., CAQSM. Primary Care and Sports Medicine [facility]  Adjunct Professor of [employer] [employer] [facility] [employer] Medicine

## 2018-03-28 NOTE — Progress Notes (Signed)
MRI order changed to update status per PCP review

## 2018-03-28 NOTE — Addendum Note (Signed)
Addended by: Lavon Paganini on: 03/28/2018 09:47 AM   Modules accepted: Orders

## 2018-03-29 ENCOUNTER — Other Ambulatory Visit: Payer: Self-pay

## 2018-03-29 ENCOUNTER — Ambulatory Visit (INDEPENDENT_AMBULATORY_CARE_PROVIDER_SITE_OTHER): Payer: Medicare Other

## 2018-03-29 DIAGNOSIS — M19011 Primary osteoarthritis, right shoulder: Secondary | ICD-10-CM | POA: Diagnosis not present

## 2018-03-29 DIAGNOSIS — M75101 Unspecified rotator cuff tear or rupture of right shoulder, not specified as traumatic: Secondary | ICD-10-CM

## 2018-03-29 DIAGNOSIS — M25511 Pain in right shoulder: Principal | ICD-10-CM

## 2018-03-29 DIAGNOSIS — G8929 Other chronic pain: Secondary | ICD-10-CM

## 2018-04-23 ENCOUNTER — Encounter: Payer: Self-pay | Admitting: Sports Medicine

## 2018-05-22 ENCOUNTER — Encounter: Payer: Self-pay | Admitting: Sports Medicine

## 2018-05-22 DIAGNOSIS — N410 Acute prostatitis: Secondary | ICD-10-CM

## 2018-05-24 MED ORDER — TAMSULOSIN HCL 0.4 MG PO CAPS: 0.4000 mg | ORAL_CAPSULE | Freq: Every day | ORAL | 3 refills | Status: DC

## 2018-06-01 DIAGNOSIS — E782 Mixed hyperlipidemia: Secondary | ICD-10-CM | POA: Insufficient documentation

## 2018-08-04 ENCOUNTER — Other Ambulatory Visit: Payer: Self-pay

## 2018-08-25 ENCOUNTER — Other Ambulatory Visit: Payer: Self-pay

## 2018-08-25 ENCOUNTER — Ambulatory Visit (INDEPENDENT_AMBULATORY_CARE_PROVIDER_SITE_OTHER): Payer: Medicare Other | Admitting: Sports Medicine

## 2018-08-25 ENCOUNTER — Encounter: Payer: Self-pay | Admitting: Sports Medicine

## 2018-08-25 DIAGNOSIS — M25511 Pain in right shoulder: Secondary | ICD-10-CM | POA: Diagnosis not present

## 2018-08-25 DIAGNOSIS — G8929 Other chronic pain: Secondary | ICD-10-CM

## 2018-08-25 NOTE — Assessment & Plan Note (Signed)
Subacromial injection. Multiple pathologic changes on MRI. Adding formal physical therapy, return to see me in 6 weeks. If this fails he would be a candidate for subacromial decompression.

## 2018-08-25 NOTE — Progress Notes (Signed)
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  Ultimately MRI showed multiple degenerative changes including acromioclavicular osteoarthritis, rotator cuff tendinosis with partial-thickness tearing, glenohumeral degenerative changes.  Now having a recurrence of pain, moderate, persistent but localized with deltoid and worse overhead activities.  Agreeable to proceed with repeat injection and formal physical therapy.  I reviewed the past medical history, family history, social history, surgical history, and allergies today and no changes were needed.  Please see the problem list section below in epic for further details.  Past Medical History: Past Medical History:  Diagnosis Date  . Bowel obstruction ([place])   . 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 H/O urinary infection    Past Surgical History: Past Surgical History:  Procedure Laterality Date  . HERNIA REPAIR    . MECKEL DIVERTICULUM EXCISION    . ORIF FINGER / THUMB FRACTURE     Social History: Social History   Socioeconomic History  . Marital status: Married    Spouse name: Not on file  . Number of children: Not on file  . Years of education: Not on file  . Highest education level: Not on file  Occupational History  . Not on file  Social Needs  . Financial resource strain: Not on file  . Food insecurity    Worry: Not on file    Inability: Not on file  . Transportation needs    Medical: Not on file    Non-medical: Not on file  Tobacco Use  . Smoking status: Former Smoker    Packs/day: 0.50    Years: 23.00    Pack years: 11.50    Quit date: 03/05/1988    Years since quitting: 30.4  . Smokeless tobacco: Never Used  Substance and Sexual Activity  . Alcohol use: Yes    Alcohol/week: 6.0 standard drinks    Types: 6 Cans of beer  per week  . Drug use: No  . Sexual activity: Not on file  Lifestyle  . Physical activity    Days per week: Not on file    Minutes per session: Not on file  . Stress: Not on file  Relationships  . Social Herbalist on phone: Not on file    Gets together: Not on file    Attends religious service: Not on file    Active member of club or organization: Not on file    Attends meetings of clubs or organizations: Not on file    Relationship status: Not on file  Other Topics Concern  . Not on file  Social History Narrative  . Not on file   Family History: Family History  Problem Relation Age of Onset  . Diabetes Sister   . Cancer Mother        pancreatic  . Diabetes Brother   . Diabetes Brother    Allergies: No Known Allergies Medications: See med rec.  Review of Systems: No fevers, chills, night sweats, weight loss, chest pain, or shortness of breath.   Objective:    General: Well Developed, well nourished, and in no acute distress.  Neuro: Alert and oriented x3, extra-ocular muscles intact, sensation grossly intact.  HEENT: Normocephalic, atraumatic, pupils equal round reactive to light, neck supple, no masses, no lymphadenopathy, thyroid nonpalpable.  Skin: Warm and dry, no rashes.  Cardiac: Regular rate and rhythm, no murmurs rubs or gallops, no lower extremity edema.  Respiratory: Clear to auscultation bilaterally. Not using accessory muscles, speaking in full sentences. Right shoulder: Inspection reveals no abnormalities, atrophy or asymmetry. Palpation is normal with no tenderness over AC joint or bicipital groove. ROM is full in all planes. Rotator cuff strength normal throughout. Positive Neer and Hawkin's tests, empty can. Speeds and Yergason's tests normal. No labral pathology noted with negative Obrien's, negative crank, negative clunk, and good stability. Normal scapular function observed. No painful arc and no drop arm sign. No apprehension sign   Procedure: Real-time Ultrasound Guided injection of the right subacromial bursa Device: GE Logiq E  Verbal informed consent obtained.  Time-out conducted.  Noted no overlying erythema, induration, or other signs of local infection.  Skin prepped in a sterile fashion.  Local anesthesia: Topical Ethyl chloride.  With sterile technique and under real time ultrasound guidance:  Using a 22 spinal needle I injected medication into the subacromial bursa, for total of 1 cc Kenalog 40, 2 cc lidocaine, 2 cc bupivacaine, I proceeded deeper and placed medication of the glenohumeral joint, an additional 1 cc Kenalog 40, 2 cc lidocaine, 2 cc bupivacaine was used. Completed without difficulty  Pain immediately resolved suggesting accurate placement of the medication.  Advised to call if fevers/chills, erythema, induration, drainage, or persistent bleeding.  Images permanently stored and available for review in the ultrasound unit.  Impression: Technically successful ultrasound guided injection.  Impression and Recommendations:    Right shoulder pain Subacromial injection. Multiple pathologic changes on MRI. Adding formal physical therapy, return to see me in 6 weeks. If this fails he would be a candidate for subacromial decompression.   ___________________________________________ Gwen Her. Dianah Field, M.D., ABFM., CAQSM. Primary Care and Sports Medicine [facility]  Adjunct Professor of [place] [employer] [facility] [employer] Medicine

## 2018-08-30 DIAGNOSIS — G5622 Lesion of ulnar nerve, left upper limb: Secondary | ICD-10-CM | POA: Diagnosis not present

## 2018-08-30 DIAGNOSIS — G5602 Carpal tunnel syndrome, left upper limb: Secondary | ICD-10-CM | POA: Diagnosis not present

## 2018-09-02 ENCOUNTER — Encounter: Payer: Self-pay | Admitting: Rehabilitative and Restorative Service Providers"

## 2018-09-02 ENCOUNTER — Other Ambulatory Visit: Payer: Self-pay

## 2018-09-02 ENCOUNTER — Ambulatory Visit (INDEPENDENT_AMBULATORY_CARE_PROVIDER_SITE_OTHER): Payer: Medicare Other | Admitting: Rehabilitative and Restorative Service Providers"

## 2018-09-02 DIAGNOSIS — R293 Abnormal posture: Secondary | ICD-10-CM

## 2018-09-02 DIAGNOSIS — M25512 Pain in left shoulder: Secondary | ICD-10-CM

## 2018-09-02 DIAGNOSIS — G8929 Other chronic pain: Secondary | ICD-10-CM | POA: Diagnosis not present

## 2018-09-02 DIAGNOSIS — R29898 Other symptoms and signs involving the musculoskeletal system: Secondary | ICD-10-CM | POA: Diagnosis not present

## 2018-09-02 DIAGNOSIS — M25511 Pain in right shoulder: Secondary | ICD-10-CM

## 2018-09-02 DIAGNOSIS — G2589 Other specified extrapyramidal and movement disorders: Secondary | ICD-10-CM

## 2018-09-02 NOTE — Patient Instructions (Signed)
Access Code: [email or web address]  URL: [email or web address]  Date: 09/02/2018  Prepared by: Kerin Perna   Exercises  Standing Cervical Retraction - 10 reps - 1 sets - 10 hold - 3x daily - 7x weekly  Standing Scapular Retraction - 10 reps - 1 sets - 10 hold - 3x daily - 7x weekly  L's - 10 reps - 1 sets - 3 hold - 3x daily - 7x weekly  Standing Shoulder W at Wall - 10 reps - 1 sets - 3 hold - 3x daily - 7x weekly  Doorway Pec Stretch at 90 Degrees Abduction - 3 reps - 1 sets - 30 seconds hold - 2-3x daily - 7x weekly

## 2018-09-02 NOTE — Therapy (Signed)
[place], [place], [address] Phone: [phone]   Fax:  [phone]  Physical Therapy Evaluation  Patient Details  Name: Jason Abbott. MRN: [id number] Date of Birth: [birth year] Referring Provider (PT): Dr Dianah Field    Encounter Date: 09/02/2018  PT End of Session - 09/02/18 1606    Visit Number  1    Number of Visits  12    Date for PT Re-Evaluation  10/14/18    PT Start Time  1537    PT Stop Time  [id number]   Simultaneous filing. User may not have seen previous data.   PT Time Calculation (min)  45 min   Simultaneous filing. User may not have seen previous data.   Activity Tolerance  Patient tolerated treatment well       Past Medical History:  Diagnosis Date  . Bowel obstruction ([place])   . 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 H/O urinary infection     Past Surgical History:  Procedure Laterality Date  . HERNIA REPAIR    . MECKEL DIVERTICULUM EXCISION    . ORIF FINGER / THUMB FRACTURE      There were no vitals filed for this visit.   Subjective Assessment - 09/02/18 1539    Subjective  Patient reports injury to Rt shoulder ~ 1 year ago and again ~ 2 months ago - could feel the tear in the shoulder. He continues to have some pain in the Rt shoulder.    Pertinent History  prior Rt shoulder injury; LBP; arthritis    Diagnostic tests  MRI - deg changes Rt shoulder; AC arthritis; RC tendinosis; partial thickness tearing    Patient Stated Goals  get shoulder stronger and less painful    Currently in Pain?  No/denies    Pain Score  0-No pain    Pain Location  Shoulder    Pain Orientation  Right    Pain Type  Chronic pain    Pain Onset  More than a month ago    Pain Frequency  Intermittent    Aggravating Factors   lifting; using Rt arm in different motions    Pain Relieving Factors  rest         [facility] PT Assessment - 09/02/18 0001       Assessment   Medical Diagnosis  Rt shoulder pain; partial RC tear     Referring Provider (PT)  Dr Dianah Field     Onset Date/Surgical Date  05/21/18   injury to Rt shoulder ~ 1 yr ago    Hand Dominance  Right    Next MD Visit  none scheduled    Prior Therapy  here for LBP       Precautions   Precautions  None      Balance Screen   Has the patient fallen in the past 6 months  No    Has the patient had a decrease in activity level because of a fear of falling?   No    Is the patient reluctant to leave their home because of a fear of falling?   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     Leisure  yard work; walking at work; plays accordian 5-10 hr/wk  Observation/Other Assessments   Focus on Therapeutic Outcomes (FOTO)   32% limitation       Sensation   Additional Comments  WFL's       Posture/Postural Control   Posture Comments  head forward; shoulders rounded and elevated; head of the humerus anterior in orientation; scapulae abducted and rotated along the thoracic wall       AROM   Right Shoulder Extension  63 Degrees    Right Shoulder Flexion  142 Degrees    Right Shoulder ABduction  150 Degrees    Right Shoulder Internal Rotation  28 Degrees    Right Shoulder External Rotation  74 Degrees    Left Shoulder Extension  50 Degrees    Left Shoulder Flexion  146 Degrees    Left Shoulder ABduction  153 Degrees    Left Shoulder Internal Rotation  37 Degrees    Left Shoulder External Rotation  82 Degrees      Strength   Overall Strength Comments  WFL's bilat UE's except lower trap 4+/5 Rt       Palpation   Palpation comment  muscular tightness through the Rt anterior chest/pecs; tender anterior shoulder                 Objective measurements completed on examination: See above findings.      [facility] Adult PT Treatment/Exercise -  09/02/18 0001      Neuro Re-ed    Neuro Re-ed Details   introduction to postural education       Shoulder Exercises: Standing   Other Standing Exercises  chin tuck x 3 reps of 3 sec, scap squeeze x 10 sec hold x 5 reps;  L's and W's x 5 sec x 5 reps (with back against pool noodle)      Shoulder Exercises: Stretch   Other Shoulder Stretches  midlevel doorway stretch x 30 sec x 2 reps              PT Education - 09/02/18 1608    Education Details  HEP, postural education    Person(s) Educated  Patient    Methods  Explanation;Demonstration;Handout;Verbal cues    Comprehension  Verbal cues required;Returned demonstration;Verbalized understanding          PT Long Term Goals - 09/02/18 1700      PT LONG TERM GOAL #1   Title  Increase AROM Rt shoulder to =/> than AROM Lt shoulder    Time  6    Period  Weeks    Status  New    Target Date  10/14/18      PT LONG TERM GOAL #2   Title  Decrease pain to 0/10 to no more than 2/10 with functional activities Rt UE    Time  6    Period  Weeks    Status  New    Target Date  10/14/18      PT LONG TERM GOAL #3   Title  Improve strength low traps =/> 4+/5 to 5-/5    Time  6    Period  Weeks    Status  New    Target Date  10/14/18      PT LONG TERM GOAL #4   Title  Independent in HEP    Time  6    Period  Weeks    Status  New    Target Date  10/14/18      PT LONG TERM GOAL #5   Title  Improve FOTO to =/<  28% limitation    Time  6    Period  Weeks    Status  New    Target Date  10/14/18             Plan - 09/02/18 1607    Clinical Impression Statement  Patient presents with recurrent Rt shoulder pain - most recent injury ~ 3 months ago. He has poor posture and alignment; limited Rt shoulder ROM compared to LT; scapular dyskinesis; pain with functional activity. Patient will benefit from PT to address problems identified.    Stability/Clinical Decision Making  Stable/Uncomplicated    Clinical Decision Making   Low    Rehab Potential  Good    PT Frequency  2x / week    PT Duration  6 weeks    PT Treatment/Interventions  Patient/family education;ADLs/Self Care Home Management;Cryotherapy;Electrical Stimulation;Iontophoresis 4mg /ml Dexamethasone;Moist Heat;Ultrasound;Manual techniques;Dry needling;Neuromuscular re-education;Therapeutic activities;Therapeutic exercise    PT Next Visit Plan  review HEP; add prolonged snow angle; increase thoracicextension; progress with stretching and strengthening; modalities as indicated    Consulted and Agree with Plan of Care  Patient       Patient will benefit from skilled therapeutic intervention in order to improve the following deficits and impairments:  Pain, Increased fascial restricitons, Increased muscle spasms, Decreased strength, Decreased mobility, Decreased range of motion, Decreased activity tolerance  Visit Diagnosis: Acute pain of right shoulder - Plan: PT plan of care cert/re-cert  Chronic left shoulder pain - Plan: PT plan of care cert/re-cert  Abnormal posture - Plan: PT plan of care cert/re-cert  Other symptoms and signs involving the musculoskeletal system - Plan: PT plan of care cert/re-cert  Scapular dyskinesis - Plan: PT plan of care cert/re-cert     Problem List Patient Active Problem List   Diagnosis Date Noted  . Hyperlipidemia, mixed 06/01/2018  . Seborrheic keratosis 01/12/2018  . Annual physical exam 04/26/2015  . Acute bronchitis 01/25/2015  . Prostatitis with Urosepsis 05/11/2014  . Peptic ulcer disease 02/23/2014  . Shingles outbreak 10/03/2013  . Right shoulder pain 10/03/2013    Jason Abbott Nilda Simmer PT, MPH  09/02/2018, 6:17 PM  [facility] [place], [place], [address] Phone: [phone]   Fax:  [phone]  Name: Faraz Pittsenbarger. MRN: [id number] Date of Birth: [birth year]

## 2018-09-06 ENCOUNTER — Ambulatory Visit (INDEPENDENT_AMBULATORY_CARE_PROVIDER_SITE_OTHER): Payer: Medicare Other | Admitting: Physical Therapy

## 2018-09-06 ENCOUNTER — Other Ambulatory Visit: Payer: Self-pay

## 2018-09-06 DIAGNOSIS — R293 Abnormal posture: Secondary | ICD-10-CM | POA: Diagnosis not present

## 2018-09-06 DIAGNOSIS — M25511 Pain in right shoulder: Secondary | ICD-10-CM | POA: Diagnosis not present

## 2018-09-06 DIAGNOSIS — M25512 Pain in left shoulder: Secondary | ICD-10-CM | POA: Diagnosis not present

## 2018-09-06 DIAGNOSIS — G8929 Other chronic pain: Secondary | ICD-10-CM

## 2018-09-06 DIAGNOSIS — R29898 Other symptoms and signs involving the musculoskeletal system: Secondary | ICD-10-CM

## 2018-09-06 NOTE — Therapy (Signed)
[facility]  [place], [place], [address] Phone: [phone]   Fax:  [phone]  Physical Therapy Treatment  Patient Details  Name: Jason Abbott. MRN: [id number] Date of Birth: 01/12/1945 Referring Provider (PT): Dr Dianah Field    Encounter Date: 09/06/2018  PT End of Session - 09/06/18 1629    Visit Number  2    Number of Visits  12    Date for PT Re-Evaluation  10/14/18    PT Start Time  [id number]    PT Stop Time  [id number]    PT Time Calculation (min)  33 min    Activity Tolerance  Patient tolerated treatment well;No increased pain    Behavior During Therapy  WFL for tasks assessed/performed       Past Medical History:  Diagnosis Date  . Bowel obstruction ([place])   . 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 H/O urinary infection     Past Surgical History:  Procedure Laterality Date  . HERNIA REPAIR    . MECKEL DIVERTICULUM EXCISION    . ORIF FINGER / THUMB FRACTURE      There were no vitals filed for this visit.  Subjective Assessment - 09/06/18 1608    Subjective  Pt reports his Rt shoulder has been feeling some better. "It could be from some of these exercises".    Currently in Pain?  No/denies    Pain Score  0-No pain    Pain Location  Shoulder    Pain Orientation  Right         [facility] PT Assessment - 09/06/18 0001      Assessment   Medical Diagnosis  Rt shoulder pain; partial RC tear     Referring Provider (PT)  Dr Dianah Field     Onset Date/Surgical Date  05/21/18   injury to Rt shoulder ~ 1 yr ago    Hand Dominance  Right    Next MD Visit  none scheduled    Prior Therapy  here for LBP        [facility] Adult PT Treatment/Exercise - 09/06/18 0001      Self-Care   Self-Care  --      Shoulder Exercises: Seated   Other Seated Exercises  thoracic ext over back of chair x 5 sec hold x 3 reps      Shoulder Exercises: Standing   External Rotation   Strengthening;Both;10 reps;Theraband    Theraband Level (Shoulder External Rotation)  Level 2 (Red)    Extension  Both;10 reps;Theraband    Theraband Level (Shoulder Extension)  Level 2 (Red)    Row  Both;10 reps;Theraband    Theraband Level (Shoulder Row)  Level 3 (Green)    Other Standing Exercises  chin tuck x 3 reps of 3 sec, scap squeeze x 10 sec hold x 5 reps;  L's and W's x 5 sec x 5 reps (with back against pool noodle)      Shoulder Exercises: ROM/Strengthening   UBE (Upper Arm Bike)  L3:  forward x 1.5 min, backward x 2 min      Shoulder Exercises: Stretch   Other Shoulder Stretches  low, midlevel, and high level doorway stretch x 15 sec x 2 reps, bicep stretch holding door frame x 20 sec, overhead stretch x 15 sec; tricep stretch with wall assist x 15 sec each arm      Other Shoulder Stretches  prolonged snow angel x 2  min                   PT Long Term Goals - 09/02/18 1700      PT LONG TERM GOAL #1   Title  Increase AROM Rt shoulder to =/> than AROM Lt shoulder    Time  6    Period  Weeks    Status  New    Target Date  10/14/18      PT LONG TERM GOAL #2   Title  Decrease pain to 0/10 to no more than 2/10 with functional activities Rt UE    Time  6    Period  Weeks    Status  New    Target Date  10/14/18      PT LONG TERM GOAL #3   Title  Improve strength low traps =/> 4+/5 to 5-/5    Time  6    Period  Weeks    Status  New    Target Date  10/14/18      PT LONG TERM GOAL #4   Title  Independent in HEP    Time  6    Period  Weeks    Status  New    Target Date  10/14/18      PT LONG TERM GOAL #5   Title  Improve FOTO to =/<  28% limitation    Time  6    Period  Weeks    Status  New    Target Date  10/14/18            Plan - 09/06/18 1623    Clinical Impression Statement  Good response to exercises thus far.  Pt tolerated new stretches and strengthening exercises well, without any production of pain.   progressing towards goals.     Stability/Clinical Decision Making  Stable/Uncomplicated    Rehab Potential  Good    PT Frequency  2x / week    PT Duration  6 weeks    PT Treatment/Interventions  Patient/family education;ADLs/Self Care Home Management;Cryotherapy;Electrical Stimulation;Iontophoresis 4mg /ml Dexamethasone;Moist Heat;Ultrasound;Manual techniques;Dry needling;Neuromuscular re-education;Therapeutic activities;Therapeutic exercise    PT Next Visit Plan  progress with stretching and strengthening; modalities as indicated    Consulted and Agree with Plan of Care  Patient       Patient will benefit from skilled therapeutic intervention in order to improve the following deficits and impairments:  Pain, Increased fascial restricitons, Increased muscle spasms, Decreased strength, Decreased mobility, Decreased range of motion, Decreased activity tolerance  Visit Diagnosis: Acute pain of right shoulder  Chronic left shoulder pain  Abnormal posture  Other symptoms and signs involving the musculoskeletal system     Problem List Patient Active Problem List   Diagnosis Date Noted  . Hyperlipidemia, mixed 06/01/2018  . Seborrheic keratosis 01/12/2018  . Annual physical exam 04/26/2015  . Acute bronchitis 01/25/2015  . Prostatitis with Urosepsis 05/11/2014  . Peptic ulcer disease 02/23/2014  . Shingles outbreak 10/03/2013  . Right shoulder pain 10/03/2013   Kerin Perna, PTA 09/06/18 4:47 PM  [place], [place], [address] Phone: [phone]   Fax:  [phone]  Name: Jason Abbott. MRN: [id number] Date of Birth: 03/23/1945

## 2018-09-09 ENCOUNTER — Encounter: Payer: Medicare Other | Admitting: Rehabilitative and Restorative Service Providers"

## 2018-09-15 ENCOUNTER — Encounter: Payer: Medicare Other | Admitting: Physical Therapy

## 2018-09-17 ENCOUNTER — Other Ambulatory Visit: Payer: Self-pay

## 2018-09-17 ENCOUNTER — Ambulatory Visit (INDEPENDENT_AMBULATORY_CARE_PROVIDER_SITE_OTHER): Payer: Medicare Other | Admitting: Rehabilitative and Restorative Service Providers"

## 2018-09-17 ENCOUNTER — Encounter: Payer: Self-pay | Admitting: Rehabilitative and Restorative Service Providers"

## 2018-09-17 DIAGNOSIS — G2589 Other specified extrapyramidal and movement disorders: Secondary | ICD-10-CM | POA: Diagnosis not present

## 2018-09-17 DIAGNOSIS — G8929 Other chronic pain: Secondary | ICD-10-CM

## 2018-09-17 DIAGNOSIS — R293 Abnormal posture: Secondary | ICD-10-CM | POA: Diagnosis not present

## 2018-09-17 DIAGNOSIS — R29898 Other symptoms and signs involving the musculoskeletal system: Secondary | ICD-10-CM | POA: Diagnosis not present

## 2018-09-17 DIAGNOSIS — M25512 Pain in left shoulder: Secondary | ICD-10-CM | POA: Diagnosis not present

## 2018-09-17 DIAGNOSIS — M25511 Pain in right shoulder: Secondary | ICD-10-CM | POA: Diagnosis not present

## 2018-09-17 NOTE — Therapy (Addendum)
Jason Abbott, [place], [address] Phone: [phone]   Fax:  [phone]  Physical Therapy Treatment  Patient Details  Name: Jason Abbott. MRN: [id number] Date of Birth: 1945/10/31 Referring Provider (PT): Dr Jason Abbott    Encounter Date: 09/17/2018  PT End of Session - 09/17/18 1451    Visit Number  3    Number of Visits  12    Date for PT Re-Evaluation  10/14/18    PT Start Time  4259    PT Stop Time  1520    PT Time Calculation (min)  31 min    Activity Tolerance  Patient tolerated treatment well       Past Medical History:  Diagnosis Date  . Bowel obstruction ([place])   . 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 H/O urinary infection     Past Surgical History:  Procedure Laterality Date  . HERNIA REPAIR    . MECKEL DIVERTICULUM EXCISION    . ORIF FINGER / THUMB FRACTURE      There were no vitals filed for this visit.  Subjective Assessment - 09/17/18 1452    Subjective  Patient reports that he has been working more hours in the past 2 weeks - shoulder feels good. Having very little pain at work and no pain at home. Has exercises at home and is comfident in continueing with independent HEP. He will call if he feels he needs more therapy.    Currently in Pain?  No/denies    Pain Score  0-No pain         [facility] PT Assessment - 09/17/18 0001      Assessment   Medical Diagnosis  Rt shoulder pain; partial RC tear     Referring Provider (PT)  Dr Jason Abbott     Onset Date/Surgical Date  05/21/18   injury to Rt shoulder ~ 1 yr ago    Hand Dominance  Right    Next MD Visit  none scheduled    Prior Therapy  here for LBP       Strength   Overall Strength Comments  WFL's bilat UE's except lower trap 5-/5 Rt                    [facility] Adult PT Treatment/Exercise - 09/17/18 0001      Shoulder Exercises: Standing   External Rotation   Strengthening;Both;20 reps;Theraband    Theraband Level (Shoulder External Rotation)  Level 3 (Green)    Extension  Strengthening;Both;20 reps;Theraband    Theraband Level (Shoulder Extension)  Level 3 (Green)    Row  Strengthening;Both;20 reps;Theraband    Theraband Level (Shoulder Row)  Level 3 (Green)    Row Limitations  bow and arrow step back x 10 each UE green TB     Retraction  Strengthening;Both;20 reps;Theraband    Theraband Level (Shoulder Retraction)  Level 3 (Green)      Shoulder Exercises: Therapy Ball   Other Therapy Ball Exercises  ball on wall at shoulder height - flex/ext; horiz ab/ad; circles CW/CCW 2-3 min standing     Other Therapy Ball Exercises  scapular depression pressing into large exercise ball 5 sec hold x 10 reps       Shoulder Exercises: ROM/Strengthening   UBE (Upper Arm Bike)  L3:  forward x 4 min, alternating backward and backward       Shoulder Exercises: Stretch   Other  Shoulder Stretches  low, midlevel, and high level doorway stretch x 30 sec x 2 reps, bicep stretch holding door frame x 20 sec, overhead stretch x 15 sec; tricep stretch with wall assist x 15 sec each arm        Shoulder Exercises: Body Blade   Flexion  60 seconds;2 reps                  PT Long Term Goals - 09/17/18 1528      PT LONG TERM GOAL #1   Title  Increase AROM Rt shoulder to =/> than AROM Lt shoulder    Time  6    Period  Weeks    Status  Partially Met      PT LONG TERM GOAL #2   Title  Decrease pain to 0/10 to no more than 2/10 with functional activities Rt UE    Time  6    Period  Weeks    Status  Achieved      PT LONG TERM GOAL #3   Title  Improve strength low traps =/> 4+/5 to 5-/5    Time  6    Period  Weeks    Status  Achieved      PT LONG TERM GOAL #4   Title  Independent in HEP    Time  6    Period  Weeks    Status  Achieved      PT LONG TERM GOAL #5   Title  Improve FOTO to =/<  28% limitation    Time  6    Period  Weeks    Status   Unable to assess            Plan - 09/17/18 1452    Clinical Impression Statement  Improved stength posterior shoulder girdle. Improve posture and alignment with posteior shoudler girdle engaged. Working well with exercises at home and feels confident in continuing wth independent HEP. Will call with an questions or problems.    Stability/Clinical Decision Making  Stable/Uncomplicated    Rehab Potential  Good    PT Frequency  2x / week    PT Duration  6 weeks    PT Treatment/Interventions  Patient/family education;ADLs/Self Care Home Management;Cryotherapy;Electrical Stimulation;Iontophoresis 30m/ml Dexamethasone;Moist Heat;Ultrasound;Manual techniques;Dry needling;Neuromuscular re-education;Therapeutic activities;Therapeutic exercise    PT Next Visit Plan  hold PT for 2-3 weeks pt will call if he wishes to continue with PT    Consulted and Agree with Plan of Care  Patient       Patient will benefit from skilled therapeutic intervention in order to improve the following deficits and impairments:  Pain, Increased fascial restricitons, Increased muscle spasms, Decreased strength, Decreased mobility, Decreased range of motion, Decreased activity tolerance  Visit Diagnosis: Acute pain of right shoulder  Chronic left shoulder pain  Abnormal posture  Other symptoms and signs involving the musculoskeletal system  Scapular dyskinesis     Problem List Patient Active Problem List   Diagnosis Date Noted  . Hyperlipidemia, mixed 06/01/2018  . Seborrheic keratosis 01/12/2018  . Annual physical exam 04/26/2015  . Acute bronchitis 01/25/2015  . Prostatitis with Urosepsis 05/11/2014  . Peptic ulcer disease 02/23/2014  . Shingles outbreak 10/03/2013  . Right shoulder pain 10/03/2013    Jason Abbott Jason SimmerPT, MPH  09/17/2018, 3:30 PM  [facility] [address]Phone: [phone]  Fax:   [phone] Name: Jason Abbott  MRN: [id number] Date of Birth: [birth year]  PHYSICAL THERAPY DISCHARGE SUMMARY  Visits from Start of Care: 3  Current functional level related to goals / functional outcomes: See last progress note for discharge status    Remaining deficits: Unknown    Education / Equipment: HEP Plan: Patient agrees to discharge.  Patient goals were partially met. Patient is being discharged due to being pleased with the current functional level.  ?????    Loetta Connelley P. Helene Kelp PT, MPH 02/25/19 10:27 AM

## 2018-10-10 ENCOUNTER — Emergency Department (INDEPENDENT_AMBULATORY_CARE_PROVIDER_SITE_OTHER)
Admission: EM | Admit: 2018-10-10 | Discharge: 2018-10-10 | Disposition: A | Discharge status: Home / Self Care | Payer: Medicare Other | Source: Home / Self Care

## 2018-10-10 ENCOUNTER — Other Ambulatory Visit: Payer: Self-pay

## 2018-10-10 ENCOUNTER — Encounter: Payer: Self-pay | Admitting: Emergency Medicine

## 2018-10-10 DIAGNOSIS — R350 Frequency of micturition: Secondary | ICD-10-CM

## 2018-10-10 DIAGNOSIS — Z8619 Personal history of other infectious and parasitic diseases: Secondary | ICD-10-CM

## 2018-10-10 DIAGNOSIS — N3001 Acute cystitis with hematuria: Secondary | ICD-10-CM

## 2018-10-10 DIAGNOSIS — Z87438 Personal history of other diseases of male genital organs: Secondary | ICD-10-CM

## 2018-10-10 LAB — POCT URINALYSIS DIP (MANUAL ENTRY)
Bilirubin, UA: NEGATIVE
Blood, UA: NEGATIVE
Glucose, UA: NEGATIVE mg/dL
Ketones, POC UA: NEGATIVE mg/dL
Nitrite, UA: POSITIVE — AB
Protein Ur, POC: NEGATIVE mg/dL
Spec Grav, UA: >=1.03 — AB (ref 1.010–1.025)
Urobilinogen, UA: 0.2 E.U./dL
pH, UA: 5.5 (ref 5.0–8.0)

## 2018-10-10 MED ORDER — TAMSULOSIN HCL 0.4 MG PO CAPS: 0.4000 mg | ORAL_CAPSULE | Freq: Every day | ORAL | 0 refills | Status: DC

## 2018-10-10 MED ORDER — CIPROFLOXACIN HCL 500 MG PO TABS: 500.0000 mg | ORAL_TABLET | Freq: Two times a day (BID) | ORAL | 0 refills | Status: DC

## 2018-10-10 NOTE — Discharge Instructions (Signed)
°  Please take antibiotics as prescribed and be sure to complete entire course even if you start to feel better to ensure infection does not come back.  Ciprofloxacin, an antibiotic, has been prescribed to you to treat a bacterial infection.  Please take as prescribed. There is a rare chance of tendon rupture while taking this type of medication.  If you develop muscle or joint pain/aches, please stop taking this medication immediately and call our office to the medication can be changed if still needed.    Please follow up with Dr. Dianah Field later this week if not improving.

## 2018-10-10 NOTE — ED Triage Notes (Signed)
Patient c/o urinary frequency, urgency, fever x 1 day.

## 2018-10-10 NOTE — ED Provider Notes (Signed)
[name]    CSN: [id number] Arrival date & time: 10/10/18  1440      History   Chief Complaint Chief Complaint  Patient presents with  . Dysuria    HPI Jason Abbott. is a 73 y.o. male.   HPI  Jason Abbott. is a 73 y.o. male presenting to UC with c/o sudden onset urinary frequency and urgency, slight decreased urine output, chills, and malodorous urine. He had a temp of 101*F earlier today. Hx of prostatitis and urosepsis.  Last UTI was in December 2019 and prostatitis 2020.  Denies n/v/d.    Past Medical History:  Diagnosis Date  . Bowel obstruction ([place])   . 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 H/O urinary infection     Patient Active Problem List   Diagnosis Date Noted  . Hyperlipidemia, mixed 06/01/2018  . Seborrheic keratosis 01/12/2018  . Annual physical exam 04/26/2015  . Acute bronchitis 01/25/2015  . Prostatitis with Urosepsis 05/11/2014  . Peptic ulcer disease 02/23/2014  . Shingles outbreak 10/03/2013  . Right shoulder pain 10/03/2013    Past Surgical History:  Procedure Laterality Date  . HERNIA REPAIR    . MECKEL DIVERTICULUM EXCISION    . ORIF FINGER / THUMB FRACTURE         Home Medications    Prior to Admission medications   Medication Sig Start Date End Date Taking? Authorizing Provider  ciprofloxacin (CIPRO) 500 MG tablet Take 1 tablet (500 mg total) by mouth 2 (two) times daily for 14 days. One po bid x 7 days 10/10/18 10/24/18  Noe Gens, PA-C  tamsulosin [facility]) 0.4 MG CAPS capsule Take 1 capsule (0.4 mg total) by mouth daily after breakfast. Patient not taking: Reported on 09/02/2018 05/24/18   Silverio Decamp, MD  tamsulosin [facility]) 0.4 MG CAPS capsule Take 1 capsule (0.4 mg total) by mouth daily after breakfast. 10/10/18   Noe Gens, PA-C    Family History Family History  Problem Relation Age of Onset  . Diabetes Sister   . Cancer Mother    pancreatic  . Diabetes Brother   . Diabetes Brother     Social History Social History   Tobacco Use  . Smoking status: Former Smoker    Packs/day: 0.50    Years: 23.00    Pack years: 11.50    Quit date: 03/05/1988    Years since quitting: 30.6  . Smokeless tobacco: Never Used  Substance Use Topics  . Alcohol use: Yes    Alcohol/week: 6.0 standard drinks    Types: 6 Cans of beer per week  . Drug use: No     Allergies   Patient has no known allergies.   Review of Systems Review of Systems  Constitutional: Positive for chills and fever.  HENT: Negative for congestion, ear pain, sore throat, trouble swallowing and voice change.   Respiratory: Negative for cough and shortness of breath.   Cardiovascular: Negative for chest pain and palpitations.  Gastrointestinal: Negative for abdominal pain, diarrhea, nausea and vomiting.  Genitourinary: Positive for decreased urine volume, frequency, hematuria (darker) and urgency. Negative for dysuria.  Musculoskeletal: Negative for arthralgias, back pain and myalgias.  Skin: Negative for rash.  Neurological: Negative for dizziness, light-headedness and headaches.     Physical Exam Triage Vital Signs ED Triage Vitals  Enc Vitals Group     BP 10/10/18 1452 (!) 173/78     Pulse Rate  10/10/18 1452 79     Resp 10/10/18 1452 16     Temp 10/10/18 1452 98.3 F (36.8 C)     Temp Source 10/10/18 1452 Oral     SpO2 10/10/18 1452 95 %     Weight 10/10/18 1452 195 lb (88.5 kg)     Height 10/10/18 1452 5\' 11"  (1.803 m)     Head Circumference --      Peak Flow --      Pain Score 10/10/18 1456 0     Pain Loc --      Pain Edu? --      Excl. in [place]? --    No data found.  Updated Vital Signs BP (!) 173/78 (BP Location: Left Arm)   Pulse 79   Temp 98.3 F (36.8 C) (Oral)   Resp 16   Ht 5\' 11"  (1.803 m)   Wt 195 lb (88.5 kg)   SpO2 95%   BMI 27.20 kg/m   Visual Acuity Right Eye Distance:   Left Eye Distance:   Bilateral  Distance:    Right Eye Near:   Left Eye Near:    Bilateral Near:     Physical Exam Vitals signs and nursing note reviewed.  Constitutional:      Appearance: Normal appearance. He is well-developed.  HENT:     Head: Normocephalic and atraumatic.  Neck:     Musculoskeletal: Normal range of motion.  Cardiovascular:     Rate and Rhythm: Normal rate and regular rhythm.  Pulmonary:     Effort: Pulmonary effort is normal. No respiratory distress.     Breath sounds: Normal breath sounds.  Abdominal:     General: There is no distension.     Palpations: Abdomen is soft.     Tenderness: There is no abdominal tenderness. There is no right CVA tenderness or left CVA tenderness.  Musculoskeletal: Normal range of motion.  Skin:    General: Skin is warm and dry.  Neurological:     Mental Status: He is alert and oriented to person, place, and time.  Psychiatric:        Behavior: Behavior normal.      UC Treatments / Results  Labs (all labs ordered are listed, but only abnormal results are displayed) Labs Reviewed  POCT URINALYSIS DIP (MANUAL ENTRY) - Abnormal; Notable for the following components:      Result Value   Spec Grav, UA >=1.030 (*)    Nitrite, UA Positive (*)    Leukocytes, UA Moderate (2+) (*)    All other components within normal limits  URINE CULTURE    EKG   Radiology No results found.  Procedures Procedures (including critical care time)  Medications Ordered in UC Medications - No data to display  Initial Impression / Assessment and Plan / UC Course  I have reviewed the triage vital signs and the nursing notes.  Pertinent labs & imaging results that were available during my care of the patient were reviewed by me and considered in my medical decision making (see chart for details).     Pt appears well, NAD, non-toxic appearing. UA c/w UTI Culture sent Reviewed medical records Pt has done best on cipro. Due to hx of prostatitis and urosepsis, will  start pt on Cipro. AVS provided.  Final Clinical Impressions(s) / UC Diagnoses   Final diagnoses:  Urinary frequency  Acute cystitis with hematuria  History of sepsis  History of prostatitis     Discharge Instructions  Please take antibiotics as prescribed and be sure to complete entire course even if you start to feel better to ensure infection does not come back.  Ciprofloxacin, an antibiotic, has been prescribed to you to treat a bacterial infection.  Please take as prescribed. There is a rare chance of tendon rupture while taking this type of medication.  If you develop muscle or joint pain/aches, please stop taking this medication immediately and call our office to the medication can be changed if still needed.    Please follow up with Dr. Dianah Field later this week if not improving.      ED Prescriptions    Medication Sig Dispense Auth. Provider   ciprofloxacin (CIPRO) 500 MG tablet  (Status: Discontinued) Take 1 tablet (500 mg total) by mouth 2 (two) times daily for 14 days. One po bid x 7 days 28 tablet Gerarda Fraction, Lion Fernandez O, PA-C   tamsulosin (FLOMAX) 0.4 MG CAPS capsule Take 1 capsule (0.4 mg total) by mouth daily after breakfast. 30 capsule Esaias Cleavenger O, PA-C   ciprofloxacin (CIPRO) 500 MG tablet Take 1 tablet (500 mg total) by mouth 2 (two) times daily for 14 days. One po bid x 7 days 28 tablet Noe Gens, [place]     PDMP not reviewed this encounter.   Noe Gens, [place] 10/10/18 1555

## 2018-10-12 LAB — URINE CULTURE
MICRO NUMBER:: 953495
SPECIMEN QUALITY:: ADEQUATE

## 2018-10-14 ENCOUNTER — Telehealth (HOSPITAL_COMMUNITY): Payer: Self-pay | Admitting: Emergency Medicine

## 2018-10-14 NOTE — Telephone Encounter (Signed)
Urine culture was positive for e coli and was given cipro  at urgent care visit. Pt contacted and made aware, educated on completing antibiotic and to follow up if symptoms are persistent. Verbalized understanding.  Pt asked if he needed to be on cipro for two weeks, per dr. Meda Coffee, would prefer if he stayed on it for two weeks, however, he needs to at least be on it for 1 week. If he feels back to normal after first week, may come off it unable to tolerate it. Pt agreeable to plan.

## 2018-10-20 ENCOUNTER — Ambulatory Visit (INDEPENDENT_AMBULATORY_CARE_PROVIDER_SITE_OTHER): Payer: Medicare Other | Admitting: Sports Medicine

## 2018-10-20 ENCOUNTER — Ambulatory Visit (INDEPENDENT_AMBULATORY_CARE_PROVIDER_SITE_OTHER): Payer: Medicare Other

## 2018-10-20 ENCOUNTER — Other Ambulatory Visit: Payer: Self-pay

## 2018-10-20 ENCOUNTER — Encounter: Payer: Self-pay | Admitting: Sports Medicine

## 2018-10-20 DIAGNOSIS — M79642 Pain in left hand: Secondary | ICD-10-CM

## 2018-10-20 DIAGNOSIS — R202 Paresthesia of skin: Secondary | ICD-10-CM | POA: Insufficient documentation

## 2018-10-20 DIAGNOSIS — R200 Anesthesia of skin: Secondary | ICD-10-CM | POA: Insufficient documentation

## 2018-10-20 DIAGNOSIS — M4802 Spinal stenosis, cervical region: Secondary | ICD-10-CM | POA: Diagnosis not present

## 2018-10-20 LAB — BASIC METABOLIC PANEL WITH GFR
BUN: 13 mg/dL (ref 7–25)
CO2: 30 mmol/L (ref 20–32)
Calcium: 8.7 mg/dL (ref 8.6–10.3)
Chloride: 106 mmol/L (ref 98–110)
Creat: 1.1 mg/dL (ref 0.70–1.18)
GFR, Est African American: 76 mL/min/{1.73_m2} (ref >=60)
GFR, Est Non African American: 66 mL/min/{1.73_m2} (ref >=60)
Glucose, Bld: 110 mg/dL — ABNORMAL HIGH (ref 65–99)
Potassium: 4.1 mmol/L (ref 3.5–5.3)
Sodium: 143 mmol/L (ref 135–146)

## 2018-10-20 LAB — MAGNESIUM: Magnesium: 1.9 mg/dL (ref 1.5–2.5)

## 2018-10-20 MED ORDER — PREDNISONE 50 MG PO TABS: ORAL_TABLET | ORAL | 0 refills | Status: DC

## 2018-10-20 MED ORDER — MAGNESIUM OXIDE 400 MG PO TABS: 800.0000 mg | ORAL_TABLET | Freq: Two times a day (BID) | ORAL | 3 refills | Status: DC

## 2018-10-20 NOTE — Progress Notes (Signed)
Subjective:    CC: Left hand pain  HPI: Jason Abbott is a pleasant 73 year old male, for the past several months has had pain in his left hand, volar aspect with radiation into the fourth and fifth fingers with occasional numbness, tingling, and sensation of swelling without visible swelling.  Occasionally his left hand will cramp up.  Symptoms are moderate, persistent.  He is post ulnar nerve transposition with Dr. Amedeo Plenty in the past.  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  I reviewed the past medical history, family history, social history, surgical history, and allergies today and no changes were needed.  Please see the problem list section below in epic for further details.  Past Medical History: Past Medical History:  Diagnosis Date  . Bowel obstruction ([place])   . 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 H/O urinary infection    Past Surgical History: Past Surgical History:  Procedure Laterality Date  . HERNIA REPAIR    . MECKEL DIVERTICULUM EXCISION    . ORIF FINGER / THUMB FRACTURE     Social History: Social History   Socioeconomic History  . Marital status: Married    Spouse name: Not on file  . Number of children: Not on file  . Years of education: Not on file  . Highest education level: Not on file  Occupational History  . Not on file  Social Needs  . Financial resource strain: Not on file  . Food insecurity    Worry: Not on file    Inability: Not on file  . Transportation needs    Medical: Not on file    Non-medical: Not on file  Tobacco Use  . Smoking status: Former Smoker    Packs/day: 0.50    Years: 23.00    Pack years: 11.50    Quit date: 03/05/1988    Years since quitting: 30.6  . Smokeless tobacco: Never Used  Substance and Sexual Activity  . Alcohol use: Yes   Alcohol/week: 6.0 standard drinks    Types: 6 Cans of beer per week  . Drug use: No  . Sexual activity: Not on file  Lifestyle  . Physical activity    Days per week: Not on file    Minutes per session: Not on file  . Stress: Not on file  Relationships  . Social Herbalist on phone: Not on file    Gets together: Not on file    Attends religious service: Not on file    Active member of club or organization: Not on file    Attends meetings of clubs or organizations: Not on file    Relationship status: Not on file  Other Topics Concern  . Not on file  Social History Narrative  . Not on file   Family History: Family History  Problem Relation Age of Onset  . Diabetes Sister   . Cancer Mother        pancreatic  . Diabetes Brother   . Diabetes Brother    Allergies: No Known Allergies Medications: See med rec.  Review of Systems: No fevers, chills, night sweats, weight loss, chest pain, or shortness of breath.   Objective:    General: Well Developed, well nourished, and in no acute distress.  Neuro: Alert and oriented x3, extra-ocular muscles intact, sensation grossly intact.  HEENT: Normocephalic, atraumatic, pupils equal round reactive to light, neck supple, no masses, no lymphadenopathy, thyroid nonpalpable.  Skin: Warm and dry, no rashes. Cardiac: Regular rate and rhythm, no murmurs rubs or gallops, no lower extremity edema.  Respiratory: Clear to auscultation bilaterally. Not using accessory muscles, speaking in full sentences. Neck: Negative spurling's Full neck range of motion Grip strength and sensation normal in bilateral hands Strength good C4 to T1 distribution No sensory change to C4 to T1 Reflexes normal  Impression and Recommendations:    Left hand pain Cramping in the left hand was paresthesias into the fourth and fifth fingers. He is post ulnar nerve transposition. Pain present for greater than 6 weeks in spite of physician directed  conservative measures. It is possible that some of this is radicular, x-rays, cervical spine MRI, prednisone, due to cramping both during the day and nocturnal we are going to add magnesium oxide 800 mg 3 times daily. Checking electrolytes.  Return to see me after MRI results.   ___________________________________________ Gwen Her. Dianah Field, M.D., ABFM., CAQSM. Primary Care and Sports Medicine [facility]  Adjunct Professor of [place] of [facility] of Medicine

## 2018-10-20 NOTE — Assessment & Plan Note (Signed)
Cramping in the left hand was paresthesias into the fourth and fifth fingers. He is post ulnar nerve transposition. Pain present for greater than 6 weeks in spite of physician directed conservative measures. It is possible that some of this is radicular, x-rays, cervical spine MRI, prednisone, due to cramping both during the day and nocturnal we are going to add magnesium oxide 800 mg 3 times daily. Checking electrolytes.  Return to see me after MRI results.

## 2018-10-24 ENCOUNTER — Other Ambulatory Visit: Payer: Self-pay

## 2018-10-24 ENCOUNTER — Ambulatory Visit (INDEPENDENT_AMBULATORY_CARE_PROVIDER_SITE_OTHER): Payer: Medicare Other

## 2018-10-24 DIAGNOSIS — M4802 Spinal stenosis, cervical region: Secondary | ICD-10-CM | POA: Diagnosis not present

## 2018-10-24 DIAGNOSIS — M47812 Spondylosis without myelopathy or radiculopathy, cervical region: Secondary | ICD-10-CM | POA: Diagnosis not present

## 2018-10-24 DIAGNOSIS — M79642 Pain in left hand: Secondary | ICD-10-CM | POA: Diagnosis not present

## 2018-11-01 ENCOUNTER — Telehealth: Payer: Medicare Other | Admitting: Physician Assistant

## 2018-11-01 DIAGNOSIS — R21 Rash and other nonspecific skin eruption: Secondary | ICD-10-CM | POA: Diagnosis not present

## 2018-11-01 DIAGNOSIS — L255 Unspecified contact dermatitis due to plants, except food: Secondary | ICD-10-CM

## 2018-11-01 MED ORDER — PREDNISONE 20 MG PO TABS: ORAL_TABLET | ORAL | 0 refills | Status: DC

## 2018-11-01 NOTE — Progress Notes (Signed)

## 2018-11-09 ENCOUNTER — Ambulatory Visit (INDEPENDENT_AMBULATORY_CARE_PROVIDER_SITE_OTHER): Payer: Medicare Other | Admitting: Sports Medicine

## 2018-11-09 ENCOUNTER — Encounter: Payer: Self-pay | Admitting: Sports Medicine

## 2018-11-09 ENCOUNTER — Other Ambulatory Visit: Payer: Self-pay

## 2018-11-09 VITALS — BP 159/81 | HR 91 | Temp 97.5°F | Ht 71.0 in | Wt 198.0 lb

## 2018-11-09 DIAGNOSIS — R82998 Other abnormal findings in urine: Secondary | ICD-10-CM

## 2018-11-09 DIAGNOSIS — M79642 Pain in left hand: Secondary | ICD-10-CM

## 2018-11-09 DIAGNOSIS — N410 Acute prostatitis: Secondary | ICD-10-CM | POA: Diagnosis not present

## 2018-11-09 DIAGNOSIS — R300 Dysuria: Secondary | ICD-10-CM | POA: Diagnosis not present

## 2018-11-09 LAB — POCT URINALYSIS DIPSTICK
Bilirubin, UA: NEGATIVE
Blood, UA: NEGATIVE
Glucose, UA: NEGATIVE
Ketones, UA: NEGATIVE
Nitrite, UA: POSITIVE
Protein, UA: NEGATIVE
Spec Grav, UA: 1.025 (ref 1.010–1.025)
Urobilinogen, UA: 0.2 E.U./dL
pH, UA: 7 (ref 5.0–8.0)

## 2018-11-09 MED ORDER — NITROFURANTOIN MONOHYD MACRO 100 MG PO CAPS: 100.0000 mg | ORAL_CAPSULE | Freq: Two times a day (BID) | ORAL | 0 refills | Status: AC

## 2018-11-09 NOTE — Assessment & Plan Note (Signed)
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 We will bring this up at the follow-up visit and discuss cervical epidural.

## 2018-11-09 NOTE — Assessment & Plan Note (Signed)
History of prostatitis with urosepsis about 3 years ago, did well with Cipro approximately 10 months ago. Now having a recurrence of urinary tract symptoms, likely recurrence of prostatitis. Previously he has grown out pansensitive E. coli, today we will switch to Macrobid for 28 days, urinary tract ultrasound. Awaiting urine culture, return in a month.

## 2018-11-09 NOTE — Progress Notes (Signed)
Subjective:    CC: Urinary frequency/ urgency  HPI: Jason Abbott is a pleasant 73 year old with a history significant for UTI 1 month ago treated with 10 days of ciprofloxacin his symptoms have returned over the past 4 days and he is having urinary frequency and urgency as well as fowl smelling urine. He denies back pain and pyuria. He has had no fever chills or myalgias.  I reviewed the past medical history, family history, social history, surgical history, and allergies today and no changes were needed.  Please see the problem list section below in epic for further details.  Past Medical History: Past Medical History:  Diagnosis Date  . Bowel obstruction ([place])   . 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 H/O urinary infection    Past Surgical History: Past Surgical History:  Procedure Laterality Date  . HERNIA REPAIR    . MECKEL DIVERTICULUM EXCISION    . ORIF FINGER / THUMB FRACTURE     Social History: Social History   Socioeconomic History  . Marital status: Married    Spouse name: Not on file  . Number of children: Not on file  . Years of education: Not on file  . Highest education level: Not on file  Occupational History  . Not on file  Social Needs  . Financial resource strain: Not on file  . Food insecurity    Worry: Not on file    Inability: Not on file  . Transportation needs    Medical: Not on file    Non-medical: Not on file  Tobacco Use  . Smoking status: Former Smoker    Packs/day: 0.50    Years: 23.00    Pack years: 11.50    Quit date: 03/05/1988    Years since quitting: 30.7  . Smokeless tobacco: Never Used  Substance and Sexual Activity  . Alcohol use: Yes    Alcohol/week: 6.0 standard drinks    Types: 6 Cans of beer per week  . Drug use: No  . Sexual activity: Not on file  Lifestyle  . Physical activity    Days per week: Not on file    Minutes per session: Not on file  . Stress: Not on file  Relationships  .  Social Herbalist on phone: Not on file    Gets together: Not on file    Attends religious service: Not on file    Active member of club or organization: Not on file    Attends meetings of clubs or organizations: Not on file    Relationship status: Not on file  Other Topics Concern  . Not on file  Social History Narrative  . Not on file   Family History: Family History  Problem Relation Age of Onset  . Diabetes Sister   . Cancer Mother        pancreatic  . Diabetes Brother   . Diabetes Brother    Allergies: No Known Allergies Medications: See med rec.  Review of Systems: No fevers, chills, night sweats, weight loss, chest pain, or shortness of breath.   Objective:    Physical Exam Vitals signs and nursing note reviewed.  Constitutional:      Appearance: Normal appearance. He is well-developed.  HENT:     Head: Normocephalic and atraumatic.  Neck:     Musculoskeletal: Normal range of motion.  Cardiovascular:     Rate and Rhythm: Normal rate and regular rhythm.  Pulmonary:  Effort: Pulmonary effort is normal. No respiratory distress.     Breath sounds: Normal breath sounds.  Abdominal:     General: There is no distension.     Palpations: Abdomen is soft.     Tenderness: There is no abdominal tenderness. There is no right CVA tenderness or left CVA tenderness.  Musculoskeletal: Normal range of motion.  Skin:    General: Skin is warm and dry.  Neurological:     Mental Status: He is alert and oriented to person, place, and time.  Psychiatric:        Behavior: Behavior normal.  A/P: Jason Abbott is presenting with repeat symptoms of urinary frequency, urgency and fowl smell for the last 4 days. He has had several episode of similar symptoms which have been successfully treated with Cipro. His most recent infection was approximately 1 month ago which was cultured as pan-sensitive E. Coli. His UA today was positive for Nitrites and Leukocyte esterase. Treatment  was initiated with nitrofurantoin for  28 days. Given his sex, age and history this is likely a repeat prostatitis. Cultures were sent today and he was referred for a urinary tract [place] to evaluate for structural causes of his repeat infections.  Impression and Recommendations:    Prostatitis with Urosepsis History of prostatitis with urosepsis about 3 years ago, did well with Cipro approximately 10 months ago. Now having a recurrence of urinary tract symptoms, likely recurrence of prostatitis. Previously he has grown out pansensitive E. coli, today we will switch to Macrobid for 28 days, urinary tract ultrasound. Awaiting urine culture, return in a month.  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 We will bring this up at the follow-up visit and discuss cervical epidural.   ___________________________________________ Gwen Her. Dianah Field, M.D., ABFM., CAQSM. Primary Care and Sports Medicine [facility]  Adjunct Professor of [place] of [facility] of Medicine

## 2018-11-11 LAB — URINE CULTURE
MICRO NUMBER:: 1059847
SPECIMEN QUALITY:: ADEQUATE

## 2018-11-16 ENCOUNTER — Other Ambulatory Visit: Payer: Medicare Other

## 2018-11-23 ENCOUNTER — Ambulatory Visit (INDEPENDENT_AMBULATORY_CARE_PROVIDER_SITE_OTHER): Payer: Medicare Other

## 2018-11-23 ENCOUNTER — Other Ambulatory Visit: Payer: Self-pay

## 2018-11-23 DIAGNOSIS — N281 Cyst of kidney, acquired: Secondary | ICD-10-CM | POA: Diagnosis not present

## 2018-11-23 DIAGNOSIS — N410 Acute prostatitis: Secondary | ICD-10-CM | POA: Diagnosis not present

## 2018-12-21 ENCOUNTER — Ambulatory Visit (INDEPENDENT_AMBULATORY_CARE_PROVIDER_SITE_OTHER): Payer: Medicare Other | Admitting: Sports Medicine

## 2018-12-21 DIAGNOSIS — M545 Low back pain, unspecified: Secondary | ICD-10-CM

## 2018-12-21 DIAGNOSIS — N410 Acute prostatitis: Secondary | ICD-10-CM

## 2018-12-21 DIAGNOSIS — M48061 Spinal stenosis, lumbar region without neurogenic claudication: Secondary | ICD-10-CM | POA: Insufficient documentation

## 2018-12-21 DIAGNOSIS — R300 Dysuria: Secondary | ICD-10-CM

## 2018-12-21 MED ORDER — PREDNISONE 50 MG PO TABS: ORAL_TABLET | ORAL | 0 refills | Status: DC

## 2018-12-21 MED ORDER — CEFDINIR 300 MG PO CAPS: 300.0000 mg | ORAL_CAPSULE | Freq: Two times a day (BID) | ORAL | 0 refills | Status: DC

## 2018-12-21 NOTE — Progress Notes (Signed)
Virtual Visit via Telephone   I connected with  Jason Abbott.  on 12/21/18 by telephone/telehealth and verified that I am speaking with the correct person using two identifiers.   I discussed the limitations, risks, security and privacy concerns of performing an evaluation and management service by telephone, including the higher likelihood of inaccurate diagnosis and treatment, and the availability of in person appointments.  We also discussed the likely need of an additional face to face encounter for complete and high quality delivery of care.  I also discussed with the patient that there may be a patient responsible charge related to this service. The patient expressed understanding and wishes to proceed.  Provider location is either at home or medical facility. Patient location is at their home, different from provider location. People involved in care of the patient during this telehealth encounter were myself, my nurse/medical assistant, and my front office/scheduling team member.  Subjective:    CC: Fevers, back pain  HPI: This is a pleasant 73 year old male, over the past few days has been working hard, doing a lot of lifting, he is now developed low back pain, bilateral, no radiation down the legs, in addition he has a recurrence of dysuria, fevers.  No upper respiratory symptoms, no anosmia or ageusia.  I reviewed the past medical history, family history, social history, surgical history, and allergies today and no changes were needed.  Please see the problem list section below in epic for further details.  Past Medical History: Past Medical History:  Diagnosis Date  . Bowel obstruction ([place])   . 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 H/O urinary infection    Past Surgical History: Past Surgical History:  Procedure Laterality Date  . HERNIA REPAIR    . MECKEL DIVERTICULUM EXCISION    . ORIF FINGER / THUMB FRACTURE     Social  History: Social History   Socioeconomic History  . Marital status: Married    Spouse name: Not on file  . Number of children: Not on file  . Years of education: Not on file  . Highest education level: Not on file  Occupational History  . Not on file  Tobacco Use  . Smoking status: Former Smoker    Packs/day: 0.50    Years: 23.00    Pack years: 11.50    Quit date: 03/05/1988    Years since quitting: 30.8  . Smokeless tobacco: Never Used  Substance and Sexual Activity  . Alcohol use: Yes    Alcohol/week: 6.0 standard drinks    Types: 6 Cans of beer per week  . Drug use: No  . Sexual activity: Not on file  Other Topics Concern  . Not on file  Social History Narrative  . Not on file   Social Determinants of Health   Financial Resource Strain:   . Difficulty of Paying Living Expenses: Not on file  Food Insecurity:   . Worried About Charity fundraiser in the Last Year: Not on file  . Ran Out of Food in the Last Year: Not on file  Transportation Needs:   . Lack of Transportation (Medical): Not on file  . Lack of Transportation (Non-Medical): Not on file  Physical Activity:   . Days of Exercise per Week: Not on file  . Minutes of Exercise per Session: Not on file  Stress:   . Feeling of Stress : Not on file  Social Connections:   .  Frequency of Communication with Friends and Family: Not on file  . Frequency of Social Gatherings with Friends and Family: Not on file  . Attends Religious Services: Not on file  . Active Member of Clubs or Organizations: Not on file  . Attends Archivist Meetings: Not on file  . Marital Status: Not on file   Family History: Family History  Problem Relation Age of Onset  . Diabetes Sister   . Cancer Mother        pancreatic  . Diabetes Brother   . Diabetes Brother    Allergies: No Known Allergies Medications: See med rec.  Review of Systems: No fevers, chills, night sweats, weight loss, chest pain, or shortness of breath.    Objective:    General: Speaking full sentences, no audible heavy breathing.  Sounds alert and appropriately interactive.  No other physical exam performed due to the non-face to face nature of this visit.  Impression and Recommendations:    Prostatitis with Urosepsis I think he has a recurrent urinary tract infection, fevers, last culture grew out nearly pansensitive E. coli, today we are going to use an oral third-generation cephalosporin for 7 days. He is going to give a urinalysis and urine culture sample before starting the antibiotic. Return to see me in 1 month.  Low back pain Bilateral low back pain, positional after increasing work. Adding 5 days of prednisone, follow-up with me in 4 weeks, we will likely get imaging and an MRI at that point.  I discussed the above assessment and treatment plan with the patient. The patient was provided an opportunity to ask questions and all were answered. The patient agreed with the plan and demonstrated an understanding of the instructions.   The patient was advised to call back or seek an in-person evaluation if the symptoms worsen or if the condition fails to improve as anticipated.   I provided 25 minutes of non-face-to-face time during this encounter, 15 minutes of additional time was needed to gather information, review chart, records, communicate/coordinate with staff remotely, and complete documentation.   ___________________________________________ Gwen Her. Dianah Field, M.D., ABFM., CAQSM. Primary Care and Sports Medicine [facility]  Adjunct Professor of [place] of [facility] of Medicine

## 2018-12-21 NOTE — Telephone Encounter (Signed)
He can see anyone for this, looks like [facility] [place] have openings.

## 2018-12-21 NOTE — Telephone Encounter (Signed)
I got mostly urosepsis type vibes but yes I think it is absolutely reasonable to do drive-through swab.  Because he did have a fever I am happy to do this myself, we can double book it anywhere you want today or tomorrow.

## 2018-12-21 NOTE — Telephone Encounter (Signed)
Can I go ahead and order a UA,culture, and microscopic for patient to have done prior to appt?  These are pended, please sign if OK and send pt msg to have these done prior to telephone visit

## 2018-12-21 NOTE — Assessment & Plan Note (Signed)
Bilateral low back pain, positional after increasing work. Adding 5 days of prednisone, follow-up with me in 4 weeks, we will likely get imaging and an MRI at that point.

## 2018-12-21 NOTE — Telephone Encounter (Signed)
Looks good, orders have been signed.

## 2018-12-21 NOTE — Telephone Encounter (Signed)
Pt advised.

## 2018-12-21 NOTE — Assessment & Plan Note (Signed)
I think he has a recurrent urinary tract infection, fevers, last culture grew out nearly pansensitive E. coli, today we are going to use an oral third-generation cephalosporin for 7 days. He is going to give a urinalysis and urine culture sample before starting the antibiotic. Return to see me in 1 month.

## 2018-12-22 DIAGNOSIS — N410 Acute prostatitis: Secondary | ICD-10-CM

## 2018-12-22 MED ORDER — TAMSULOSIN HCL 0.4 MG PO CAPS: 0.4000 mg | ORAL_CAPSULE | Freq: Every day | ORAL | 3 refills | Status: DC

## 2018-12-23 LAB — URINE CULTURE
MICRO NUMBER:: 1199965
SPECIMEN QUALITY:: ADEQUATE

## 2018-12-23 LAB — URINALYSIS
Bilirubin Urine: NEGATIVE
Glucose, UA: NEGATIVE
Hgb urine dipstick: NEGATIVE
Ketones, ur: NEGATIVE
Nitrite: POSITIVE — AB
Protein, ur: NEGATIVE
Specific Gravity, Urine: 1.019 (ref 1.001–1.03)
pH: 7 (ref 5.0–8.0)

## 2018-12-23 LAB — URINALYSIS, MICROSCOPIC ONLY
RBC / HPF: NONE SEEN /HPF (ref 0–2)
Squamous Epithelial / HPF: NONE SEEN /HPF (ref <=5)
WBC, UA: >=60 /HPF — AB (ref 0–5)

## 2018-12-24 ENCOUNTER — Encounter: Payer: Self-pay | Admitting: Sports Medicine

## 2018-12-24 NOTE — Progress Notes (Signed)
Patient feeling much better. Ready to return to work. Needs note.

## 2019-01-07 DIAGNOSIS — U071 COVID-19: Secondary | ICD-10-CM

## 2019-01-07 DIAGNOSIS — I639 Cerebral infarction, unspecified: Secondary | ICD-10-CM

## 2019-01-07 HISTORY — DX: COVID-19: U07.1

## 2019-01-07 HISTORY — DX: Cerebral infarction, unspecified: I63.9

## 2019-01-23 DIAGNOSIS — R9082 White matter disease, unspecified: Secondary | ICD-10-CM | POA: Diagnosis not present

## 2019-01-23 DIAGNOSIS — G319 Degenerative disease of nervous system, unspecified: Secondary | ICD-10-CM | POA: Diagnosis not present

## 2019-01-23 DIAGNOSIS — I639 Cerebral infarction, unspecified: Secondary | ICD-10-CM | POA: Diagnosis not present

## 2019-01-23 DIAGNOSIS — R279 Unspecified lack of coordination: Secondary | ICD-10-CM | POA: Diagnosis not present

## 2019-01-23 DIAGNOSIS — Z9181 History of falling: Secondary | ICD-10-CM | POA: Diagnosis not present

## 2019-01-23 DIAGNOSIS — I6329 Cerebral infarction due to unspecified occlusion or stenosis of other precerebral arteries: Secondary | ICD-10-CM | POA: Diagnosis not present

## 2019-01-23 DIAGNOSIS — D72829 Elevated white blood cell count, unspecified: Secondary | ICD-10-CM | POA: Diagnosis not present

## 2019-01-23 DIAGNOSIS — R404 Transient alteration of awareness: Secondary | ICD-10-CM | POA: Diagnosis not present

## 2019-01-23 DIAGNOSIS — I719 Aortic aneurysm of unspecified site, without rupture: Secondary | ICD-10-CM | POA: Diagnosis not present

## 2019-01-23 DIAGNOSIS — Z8249 Family history of ischemic heart disease and other diseases of the circulatory system: Secondary | ICD-10-CM | POA: Diagnosis not present

## 2019-01-23 DIAGNOSIS — I499 Cardiac arrhythmia, unspecified: Secondary | ICD-10-CM | POA: Diagnosis not present

## 2019-01-23 DIAGNOSIS — Z87891 Personal history of nicotine dependence: Secondary | ICD-10-CM | POA: Diagnosis not present

## 2019-01-23 DIAGNOSIS — Z79899 Other long term (current) drug therapy: Secondary | ICD-10-CM | POA: Diagnosis not present

## 2019-01-23 DIAGNOSIS — R0902 Hypoxemia: Secondary | ICD-10-CM | POA: Diagnosis not present

## 2019-01-23 DIAGNOSIS — R410 Disorientation, unspecified: Secondary | ICD-10-CM | POA: Diagnosis not present

## 2019-01-23 DIAGNOSIS — R531 Weakness: Secondary | ICD-10-CM | POA: Diagnosis not present

## 2019-01-23 DIAGNOSIS — R9431 Abnormal electrocardiogram [ECG] [EKG]: Secondary | ICD-10-CM | POA: Diagnosis not present

## 2019-01-23 DIAGNOSIS — R42 Dizziness and giddiness: Secondary | ICD-10-CM | POA: Diagnosis not present

## 2019-01-23 DIAGNOSIS — R000 Tachycardia, unspecified: Secondary | ICD-10-CM | POA: Diagnosis not present

## 2019-01-23 DIAGNOSIS — Z833 Family history of diabetes mellitus: Secondary | ICD-10-CM | POA: Diagnosis not present

## 2019-01-23 DIAGNOSIS — I63541 Cerebral infarction due to unspecified occlusion or stenosis of right cerebellar artery: Secondary | ICD-10-CM | POA: Diagnosis not present

## 2019-01-23 DIAGNOSIS — R4182 Altered mental status, unspecified: Secondary | ICD-10-CM | POA: Diagnosis not present

## 2019-01-23 DIAGNOSIS — R4189 Other symptoms and signs involving cognitive functions and awareness: Secondary | ICD-10-CM | POA: Diagnosis not present

## 2019-01-24 DIAGNOSIS — I519 Heart disease, unspecified: Secondary | ICD-10-CM | POA: Diagnosis not present

## 2019-01-24 DIAGNOSIS — I517 Cardiomegaly: Secondary | ICD-10-CM | POA: Diagnosis not present

## 2019-01-24 DIAGNOSIS — I6329 Cerebral infarction due to unspecified occlusion or stenosis of other precerebral arteries: Secondary | ICD-10-CM | POA: Diagnosis not present

## 2019-01-24 DIAGNOSIS — I359 Nonrheumatic aortic valve disorder, unspecified: Secondary | ICD-10-CM | POA: Diagnosis not present

## 2019-01-24 MED ORDER — ATORVASTATIN CALCIUM 20 MG PO TABS
40.00 | ORAL_TABLET | ORAL | Status: DC
Start: 2019-01-24 — End: 2019-01-24

## 2019-01-24 MED ORDER — SENNOSIDES-DOCUSATE SODIUM 8.6-50 MG PO TABS
1.00 | ORAL_TABLET | ORAL | Status: DC
Start: ? — End: 2019-01-24

## 2019-01-24 MED ORDER — LABETALOL HCL 5 MG/ML IV SOLN
10.00 | INTRAVENOUS | Status: DC
Start: ? — End: 2019-01-24

## 2019-01-24 MED ORDER — HYDRALAZINE HCL 20 MG/ML IJ SOLN
10.00 | INTRAMUSCULAR | Status: DC
Start: ? — End: 2019-01-24

## 2019-01-24 MED ORDER — ZOLPIDEM TARTRATE 5 MG PO TABS
5.00 | ORAL_TABLET | ORAL | Status: DC
Start: ? — End: 2019-01-24

## 2019-01-24 MED ORDER — ASPIRIN 325 MG PO TABS
325.00 | ORAL_TABLET | ORAL | Status: DC
Start: 2019-01-25 — End: 2019-01-24

## 2019-01-24 MED ORDER — GENERIC EXTERNAL MEDICATION
Status: DC
Start: ? — End: 2019-01-24

## 2019-01-24 MED ORDER — SODIUM CHLORIDE 0.9 % IV SOLN
50.00 | INTRAVENOUS | Status: DC
Start: ? — End: 2019-01-24

## 2019-01-24 MED ORDER — GUAIFENESIN 100 MG/5ML PO SYRP
200.00 | ORAL_SOLUTION | ORAL | Status: DC
Start: ? — End: 2019-01-24

## 2019-01-24 MED ORDER — ALBUTEROL SULFATE (2.5 MG/3ML) 0.083% IN NEBU
2.50 | INHALATION_SOLUTION | RESPIRATORY_TRACT | Status: DC
Start: ? — End: 2019-01-24

## 2019-01-24 MED ORDER — HYDROCODONE-ACETAMINOPHEN 5-325 MG PO TABS
1.00 | ORAL_TABLET | ORAL | Status: DC
Start: ? — End: 2019-01-24

## 2019-01-24 MED ORDER — ALUM & MAG HYDROXIDE-SIMETH 200-200-20 MG/5ML PO SUSP
30.00 | ORAL | Status: DC
Start: ? — End: 2019-01-24

## 2019-01-24 MED ORDER — ENOXAPARIN SODIUM 40 MG/0.4ML ~~LOC~~ SOLN
40.00 | SUBCUTANEOUS | Status: DC
Start: 2019-01-25 — End: 2019-01-24

## 2019-01-27 ENCOUNTER — Other Ambulatory Visit: Payer: Self-pay

## 2019-01-27 ENCOUNTER — Encounter: Payer: Self-pay | Admitting: Sports Medicine

## 2019-01-27 ENCOUNTER — Ambulatory Visit (INDEPENDENT_AMBULATORY_CARE_PROVIDER_SITE_OTHER): Payer: Medicare Other | Admitting: Sports Medicine

## 2019-01-27 DIAGNOSIS — E782 Mixed hyperlipidemia: Secondary | ICD-10-CM | POA: Diagnosis not present

## 2019-01-27 DIAGNOSIS — Z8673 Personal history of transient ischemic attack (TIA), and cerebral infarction without residual deficits: Secondary | ICD-10-CM | POA: Insufficient documentation

## 2019-01-27 DIAGNOSIS — R404 Transient alteration of awareness: Secondary | ICD-10-CM

## 2019-01-27 MED ORDER — CLOPIDOGREL BISULFATE 75 MG PO TABS: 75.0000 mg | ORAL_TABLET | Freq: Every day | ORAL | 3 refills | Status: DC

## 2019-01-27 MED ORDER — ASPIRIN EC 81 MG PO TBEC: 81.0000 mg | DELAYED_RELEASE_TABLET | Freq: Every day | ORAL | 3 refills | Status: DC

## 2019-01-27 NOTE — Assessment & Plan Note (Signed)
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 He was admitted and during his work-up he was found to have incidental leukocytosis which will be followed up today. 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 Echocardiogram was unremarkable, carotids and vertebrals were free of stenosis. He is on aspirin 325, I do think we need to use Plavix instead, so dropping him back down to 81 of aspirin, adding Plavix 75 mg. He will continue his atorvastatin. Considering his pseudoaneurysm of the aortic arch I would like evaluation with cardiothoracic surgery, and we certainly need neurology involved as well. We will also work on controlling his blood pressure in the meantime.

## 2019-01-27 NOTE — Progress Notes (Signed)
    Procedures performed today:    None.  Independent interpretation of tests performed by another provider:   I reviewed labs and imaging from his hospitalization, I also reviewed his discharge summary.  MR Imaging of the brain showed an acute cerebellar stroke, he had a clear CT angiography of the head and neck with clear carotids and vertebral arteries, unfortunately he did have a pseudoaneurysm of the aortic arch that appears to contain mural thrombus.  Echocardiogram was normal.  Labs were abnormal, particularly with leukocytosis.  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 He was admitted and during his work-up he was found to have incidental leukocytosis which will be followed up today. 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 Echocardiogram was unremarkable, carotids and vertebrals were free of stenosis. He is on aspirin 325, I do think we need to use Plavix instead, so dropping him back down to 81 of aspirin, adding Plavix 75 mg. He will continue his atorvastatin. Considering his pseudoaneurysm of the aortic arch I would like evaluation with cardiothoracic surgery, and we certainly need neurology involved as well. We will also work on controlling his blood pressure in the meantime.    ___________________________________________ Jason Abbott. Dianah Field, M.D., ABFM., CAQSM. Primary Care and [place] Instructor of [place] [facility] of Medicine

## 2019-01-28 LAB — COMPLETE METABOLIC PANEL WITH GFR
AG Ratio: 2 (calc) (ref 1.0–2.5)
ALT: 24 U/L (ref 9–46)
AST: 20 U/L (ref 10–35)
Albumin: 4.4 g/dL (ref 3.6–5.1)
Alkaline phosphatase (APISO): 61 U/L (ref 35–144)
BUN: 14 mg/dL (ref 7–25)
CO2: 31 mmol/L (ref 20–32)
Calcium: 9.4 mg/dL (ref 8.6–10.3)
Chloride: 104 mmol/L (ref 98–110)
Creat: 1.08 mg/dL (ref 0.70–1.18)
GFR, Est African American: 78 mL/min/{1.73_m2} (ref >=60)
GFR, Est Non African American: 67 mL/min/{1.73_m2} (ref >=60)
Globulin: 2.2 g/dL (calc) (ref 1.9–3.7)
Glucose, Bld: 89 mg/dL (ref 65–99)
Potassium: 4.4 mmol/L (ref 3.5–5.3)
Sodium: 140 mmol/L (ref 135–146)
Total Bilirubin: 0.7 mg/dL (ref 0.2–1.2)
Total Protein: 6.6 g/dL (ref 6.1–8.1)

## 2019-01-28 LAB — CBC
HCT: 45.7 % (ref 38.5–50.0)
Hemoglobin: 15.1 g/dL (ref 13.2–17.1)
MCH: 30.5 pg (ref 27.0–33.0)
MCHC: 33 g/dL (ref 32.0–36.0)
MCV: 92.3 fL (ref 80.0–100.0)
MPV: 12.2 fL (ref 7.5–12.5)
Platelets: 130 10*3/uL — ABNORMAL LOW (ref 140–400)
RBC: 4.95 10*6/uL (ref 4.20–5.80)
RDW: 12.7 % (ref 11.0–15.0)
WBC: 14.6 10*3/uL — ABNORMAL HIGH (ref 3.8–10.8)

## 2019-01-28 LAB — LIPID PANEL W/REFLEX DIRECT LDL
Cholesterol: 156 mg/dL (ref <200)
HDL: 30 mg/dL — ABNORMAL LOW (ref >=40)
LDL Cholesterol (Calc): 101 mg/dL (calc) — ABNORMAL HIGH
Non-HDL Cholesterol (Calc): 126 mg/dL (calc) (ref <130)
Total CHOL/HDL Ratio: 5.2 (calc) — ABNORMAL HIGH (ref <5.0)
Triglycerides: 150 mg/dL — ABNORMAL HIGH (ref <150)

## 2019-01-28 LAB — TSH: TSH: 3.72 mIU/L (ref 0.40–4.50)

## 2019-01-31 ENCOUNTER — Ambulatory Visit (INDEPENDENT_AMBULATORY_CARE_PROVIDER_SITE_OTHER): Payer: Medicare Other | Admitting: Diagnostic Neuroimaging

## 2019-01-31 ENCOUNTER — Encounter: Payer: Self-pay | Admitting: Diagnostic Neuroimaging

## 2019-01-31 ENCOUNTER — Other Ambulatory Visit: Payer: Self-pay

## 2019-01-31 VITALS — BP 150/94 | HR 73 | Temp 97.6°F | Ht 70.0 in | Wt 203.0 lb

## 2019-01-31 DIAGNOSIS — R410 Disorientation, unspecified: Secondary | ICD-10-CM

## 2019-01-31 DIAGNOSIS — I63441 Cerebral infarction due to embolism of right cerebellar artery: Secondary | ICD-10-CM

## 2019-01-31 NOTE — Patient Instructions (Addendum)
STROKE PREVENTION - continue aspirin 81 + plavix 75mg  daily x 3 months; then reduce to aspiin 81mg  daily alone - continue statin, BP control - follow up with CT surgery re: aortic arch pseudoaneurysm and mural thrombus; may consider cardiology consult

## 2019-01-31 NOTE — Progress Notes (Signed)
[facility]  PATIENT: Jason Abbott. DOB: 07-30-1945  REFERRING CLINICIAN: Silverio Decamp,* HISTORY FROM: patient and wife  REASON FOR VISIT: new consult     HISTORICAL  CHIEF COMPLAINT:  Chief Complaint  Patient presents with  . History of Sroke    rm 7 New pt, wife- [name]    HISTORY OF PRESENT ILLNESS:   74 year old male here for evaluation of stroke.  01/23/2019 patient was admitted to the hospital for confusion and disorientation.  MRI of the brain showed acute cerebellar ischemic infarction.  Patient was found to have pseudoaneurysm of the aortic arch, but not felt to be related to the stroke according to hospital discharge notes. He was treated with aspirin and statin.  Patient was recommended to have follow-up with neurology. Now doing better.    REVIEW OF SYSTEMS: Full 14 system review of systems performed and negative with exception of: as per HPI.  ALLERGIES: No Known Allergies  HOME MEDICATIONS: Outpatient Medications Prior to Visit  Medication Sig Dispense Refill  . aspirin EC 81 MG tablet Take 1 tablet (81 mg total) by mouth daily. 90 tablet 3  . atorvastatin (LIPITOR) 40 MG tablet Take by mouth.    . clopidogrel (PLAVIX) 75 MG tablet Take 1 tablet (75 mg total) by mouth daily. 30 tablet 3  . magnesium oxide (MAG-OX) 400 MG tablet Take 2 tablets (800 mg total) by mouth 2 (two) times daily. 180 tablet 3   No facility-administered medications prior to visit.    PAST MEDICAL HISTORY: Past Medical History:  Diagnosis Date  . Bowel obstruction ([employer])   . 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 H/O urinary infection   . Stroke [facility]) 01/2019    PAST SURGICAL HISTORY: Past Surgical History:  Procedure Laterality Date  . HERNIA REPAIR    . MECKEL DIVERTICULUM EXCISION    . ORIF FINGER / THUMB FRACTURE      FAMILY HISTORY: Family History  Problem Relation Age of Onset  . Diabetes  Sister   . Cancer Mother        pancreatic  . Diabetes Brother   . Diabetes Brother     SOCIAL HISTORY: Social History   Socioeconomic History  . Marital status: Married    Spouse name: [name]  . Number of children: Not on file  . Years of education: Not on file  . Highest education level: Some college, no degree  Occupational History    Comment: retired Corporate treasurer, Biomedical scientist, works part time  Tobacco Use  . Smoking status: Former Smoker    Packs/day: 0.50    Years: 23.00    Pack years: 11.50    Quit date: 03/05/1988    Years since quitting: 30.9  . Smokeless tobacco: Never Used  Substance and Sexual Activity  . Alcohol use: Yes    Alcohol/week: 6.0 standard drinks    Types: 6 Cans of beer per week  . Drug use: No  . Sexual activity: Not on file  Other Topics Concern  . Not on file  Social History Narrative   lives with wife   Caffeine- coffee 1-2 daily   Social Determinants of Health   Financial Resource Strain:   . Difficulty of Paying Living Expenses: Not on file  Food Insecurity:   . Worried About Charity fundraiser in the Last Year: Not on file  . Ran Out of Food in the Last Year: Not on file  Transportation Needs:   . Film/video editor (Medical): Not on file  . Lack of Transportation (Non-Medical): Not on file  Physical Activity:   . Days of Exercise per Week: Not on file  . Minutes of Exercise per Session: Not on file  Stress:   . Feeling of Stress : Not on file  Social Connections:   . Frequency of Communication with Friends and Family: Not on file  . Frequency of Social Gatherings with Friends and Family: Not on file  . Attends Religious Services: Not on file  . Active Member of Clubs or Organizations: Not on file  . Attends Archivist Meetings: Not on file  . Marital Status: Not on file  Intimate Partner Violence:   . Fear of Current or Ex-Partner: Not on file  . Emotionally Abused: Not on file  . Physically Abused: Not on file  . Sexually  Abused: Not on file     PHYSICAL EXAM  GENERAL EXAM/CONSTITUTIONAL: Vitals:  Vitals:   01/31/19 1407  BP: (!) 150/94  Pulse: 73  Temp: 97.6 F (36.4 C)  Weight: 203 lb (92.1 kg)  Height: 5\' 10"  (1.778 m)     Body mass index is 29.13 kg/m. Wt Readings from Last 3 Encounters:  01/31/19 203 lb (92.1 kg)  01/27/19 205 lb (93 kg)  11/09/18 198 lb (89.8 kg)     Patient is in no distress; well developed, nourished and groomed; neck is supple  CARDIOVASCULAR:  Examination of carotid arteries is normal; no carotid bruits  Regular rate and rhythm, no murmurs  Examination of peripheral vascular system by observation and palpation is normal  EYES:  Ophthalmoscopic exam of optic discs and posterior segments is normal; no papilledema or hemorrhages  No exam data present  MUSCULOSKELETAL:  Gait, strength, tone, movements noted in Neurologic exam below  NEUROLOGIC: MENTAL STATUS:  No flowsheet data found.  awake, alert, oriented to person, place and time  recent and remote memory intact  normal attention and concentration  language fluent, comprehension intact, naming intact  fund of knowledge appropriate  CRANIAL NERVE:   2nd - no papilledema on fundoscopic exam  2nd, 3rd, 4th, 6th - pupils equal and reactive to light, visual fields full to confrontation, extraocular muscles intact, no nystagmus  5th - facial sensation symmetric  7th - facial strength symmetric  8th - hearing intact  9th - palate elevates symmetrically, uvula midline  11th - shoulder shrug symmetric  12th - tongue protrusion midline  MOTOR:   normal bulk and tone, full strength in the BUE, BLE  SENSORY:   normal and symmetric to light touch, temperature, vibration; DECR IN RIGHT FOOT  COORDINATION:   finger-nose-finger, fine finger movements normal; SLIGHT DYSMETRIA IN LUE  REFLEXES:   deep tendon reflexes TRACE and symmetric  GAIT/STATION:   narrow based  gait     DIAGNOSTIC DATA (LABS, IMAGING, TESTING) - I reviewed patient records, labs, notes, testing and imaging myself where available.  Lab Results  Component Value Date   WBC 14.6 (H) 01/27/2019   HGB 15.1 01/27/2019   HCT 45.7 01/27/2019   MCV 92.3 01/27/2019   PLT 130 (L) 01/27/2019      Component Value Date/Time   NA 140 01/27/2019 1204   K 4.4 01/27/2019 1204   CL 104 01/27/2019 1204   CO2 31 01/27/2019 1204   GLUCOSE 89 01/27/2019 1204   BUN 14 01/27/2019 1204   CREATININE 1.08 01/27/2019 1204   CALCIUM 9.4 01/27/2019  1204   PROT 6.6 01/27/2019 1204   ALBUMIN 4.1 11/08/2015 0943   AST 20 01/27/2019 1204   ALT 24 01/27/2019 1204   ALKPHOS 64 11/08/2015 0943   BILITOT 0.7 01/27/2019 1204   GFRNONAA 67 01/27/2019 1204   GFRAA 78 01/27/2019 1204   Lab Results  Component Value Date   CHOL 156 01/27/2019   HDL 30 (L) 01/27/2019   LDLCALC 101 (H) 01/27/2019   TRIG 150 (H) 01/27/2019   CHOLHDL 5.2 (H) 01/27/2019   No results found for: HGBA1C No results found for: VITAMINB12 Lab Results  Component Value Date   TSH 3.72 01/27/2019    01/24/19 A1c 5.6  01/24/19 lipids panel  - HDL 27, LDL 101, CHOL 158, TRIG 149  01/23/19 EKG Wide QRS rhythm Right bundle branch block Septal infarct , age undetermined Abnormal ECG No previous ECGs available  01/24/19 TTE - Aortic Valve: The leaflets are mildly thickened and exhibit normal  excursion. - Left Ventricle: Systolic function is normal. EF: 60-65%. Wall motion is within normal limits.  01/23/19 CTA head / neck - No stenosis, large vessel occlusion, or aneurysm. 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 This is a typical location for a posttraumatic pseudoaneurysm. - Moderate ectasia/aneurysmal dilatation of the aortic arch.  01/23/19 MRI brain - Small acute infarct superior right cerebellar hemisphere.    ASSESSMENT AND PLAN  74 y.o. year old male  here with right superior cerebellar ischemic infarction.  Unclear etiology of stroke but based on CTA report, mural thrombus projecting from the aortic arch could be a potential embolic source.  I do not have imaging to review myself directly.  Patient does have follow-up with CT surgeon to look at other surgery or procedures for pseudoaneurysm and mural thrombus.  For now recommend dual antiplatelet therapy for the next 3 months and then reduce to aspirin alone.  Also of note patient presented with transient memory loss and confusion, which does not localize to the cerebellar infarct seen on MRI.  It is possible that patient had a more widespread embolic shower which caused the transient confusion.  Also of note patient has otherwise unexplained leukocytosis and thrombocytopenia for past few months, but trending better.  Follow-up with PCP or heme-onc if not normalized.  Dx:  1. 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 Transient confusion     PLAN:  STROKE PREVENTION - continue aspirin 81 + plavix 75mg  daily x 3 months; then reduce to aspirin 81mg  daily alone - continue statin, BP control -Reviewed guidance for improving nutrition and gradually increasing physical activity; need to discuss with PCP and CT surgeon before starting heavy exertion exercise such as running - follow up with CT surgery re: aortic arch pseudoaneurysm and mural thrombus; may consider cardiology consult  LEUKOCYTOSIS / THROMBOCYTOPENIA - follow up CBC and with PCP; consider heme-onc consult if not normalized  Return for pending if symptoms worsen or fail to improve.    Penni Bombard, MD [id number], [id number] PM Certified in Neurology, Neurophysiology and Neuroimaging  [facility] [address] [place], [place] [address] [phone]

## 2019-02-01 ENCOUNTER — Encounter: Payer: Self-pay | Admitting: Internal Medicine

## 2019-02-09 ENCOUNTER — Other Ambulatory Visit: Payer: Self-pay | Admitting: Cardiothoracic Surgery

## 2019-02-09 ENCOUNTER — Encounter: Payer: Self-pay | Admitting: Cardiothoracic Surgery

## 2019-02-09 ENCOUNTER — Other Ambulatory Visit: Payer: Self-pay

## 2019-02-09 ENCOUNTER — Institutional Professional Consult (permissible substitution) (INDEPENDENT_AMBULATORY_CARE_PROVIDER_SITE_OTHER): Payer: Medicare Other | Admitting: Cardiothoracic Surgery

## 2019-02-09 VITALS — BP 162/87 | HR 56 | Temp 97.7°F | Resp 20 | Ht 71.0 in | Wt 198.0 lb

## 2019-02-09 DIAGNOSIS — I712 Thoracic aortic aneurysm, without rupture, unspecified: Secondary | ICD-10-CM

## 2019-02-09 DIAGNOSIS — I7122 Aneurysm of the aortic arch, without rupture: Secondary | ICD-10-CM | POA: Insufficient documentation

## 2019-02-09 DIAGNOSIS — I63441 Cerebral infarction due to embolism of right cerebellar artery: Secondary | ICD-10-CM | POA: Diagnosis not present

## 2019-02-09 NOTE — Progress Notes (Signed)
PCP is Silverio Decamp, MD Referring Provider is Silverio Decamp,*  Chief Complaint  Patient presents with  . Thoracic Aortic Aneurysm    Surgical eval, ECHO 01/24/19, CTA  head/neck 01/23/19-[facility]  Patient examined, images of outside CTA of the neck and upper thoracic aorta personally reviewed and counseled with patient.  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  This was an incidental finding when he was evaluated for a right cerebellar stroke in mid January 2021.  His symptoms of amnesia and confusion resolved after several hours.  An echocardiogram showed normal LV function without LV thrombus.  He had no evidence of arrhythmia or atrial fibrillation.  His blood pressure was within normal range.  He was seen in follow-up by a neurologist who recommended no further scans but did recommend that he take aspirin 81 mg, Plavix, and Lipitor 40 mg.  Patient had an episode in 1989 after he left the [employer] where he had a 6 foot fall onto the ground landing on his left shoulder with a shoulder separation.  No chest pain shortness of breath syncope or  hemoptysis.  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  He has never had a CT scan of the chest until now.  He in fact he was on no medications and only presented with a stroke.  There is no family history of thoracic or abdominal aortic aneurysm disease.  The patient's blood pressures recently have been measured at [id number] systolic.  However today in the office I personally measured blood pressure at 130/80.  Past Medical History:  Diagnosis Date  . Bowel obstruction ([place])   . 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 H/O urinary infection   . Stroke [facility])  01/2019    Past Surgical History:  Procedure Laterality Date  . HERNIA REPAIR    . MECKEL DIVERTICULUM EXCISION    . ORIF FINGER / THUMB FRACTURE      Family History  Problem Relation Age of Onset  . Diabetes Sister   . Cancer Mother        pancreatic  . Diabetes Brother   . Diabetes Brother     Social History Social History   Tobacco Use  . Smoking status: Former Smoker    Packs/day: 0.50    Years: 23.00    Pack years: 11.50    Quit date: 03/05/1988    Years since quitting: 30.9  . Smokeless tobacco: Never Used  Substance Use Topics  . Alcohol use: Yes    Alcohol/week: 6.0 standard drinks    Types: 6 Cans of beer per week  . Drug use: No    Current Outpatient Medications  Medication Sig Dispense Refill  . aspirin EC 81 MG tablet Take 1 tablet (81 mg total) by mouth daily. 90 tablet 3  . atorvastatin (LIPITOR) 40 MG tablet Take by mouth.    . clopidogrel (PLAVIX) 75 MG tablet Take 1 tablet (75 mg total) by mouth daily. 30 tablet 3  . magnesium oxide (MAG-OX) 400 MG tablet Take 2 tablets (800 mg total) by mouth 2 (two) times daily. 180 tablet 3   No current facility-administered medications for this visit.    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 No difficulty swallowing Weight has been stable at around 200 pounds for 30 years No history of DVT claudication Symptoms of memory loss and confusion associated with a stroke last month have resolved   BP (!) 162/87   Pulse (!) 56   Temp 97.7 F (36.5 C) (Skin)   Resp 20   Ht 5\' 11"  (1.803 m)   Wt 198 lb (89.8 kg)   SpO2 96% Comment: RA  BMI 27.62 kg/m  Physical Exam Blood pressure personally repeated at 130/80 HEENT normocephalic pupils equal no carotid bruit Lungs clear without  deformity Heart rate regular without murmur gallop Extremities with normal pulses no clubbing cyanosis or edema Abdomen soft without pulsatile mass No focal motor deficit  Diagnostic Tests: CTA of neck personally reviewed.  This does not demonstrate much of the thoracic aorta except for the arch vessels showing the anomalous right subclavian artery and showing a lobulated configuration of the arch measuring 4.9 cm diameter with a small chronic pseudoaneurysm on the lesser curve  Impression: Abnormality of the aortic arch, possibly related to his fall on his left shoulder 1988.  The patient will need a full CTA of thoracic aorta which will be scheduled next month to allow some time of recovery from his previous scans.  He will need to start checking his blood pressure daily and will bring records of his blood pressure when he returns for the follow-up CTA.  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  He does not meet criteria for surgery but should monitor blood pressure with a goal of keeping systolic blood pressure less than 140.  If his blood pressure becomes elevated then I would recommend adding losartan starting at 50 mg daily.  We discussed his activity limitations following his stroke and I recommended against anything other than walking and recommended that he not lift more than 50 pounds on occasion.  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   Len Childs, MD [facility] [phone]

## 2019-02-23 ENCOUNTER — Other Ambulatory Visit: Payer: Self-pay

## 2019-02-23 ENCOUNTER — Ambulatory Visit (INDEPENDENT_AMBULATORY_CARE_PROVIDER_SITE_OTHER): Payer: Medicare Other | Admitting: Sports Medicine

## 2019-02-23 ENCOUNTER — Encounter: Payer: Self-pay | Admitting: Sports Medicine

## 2019-02-23 DIAGNOSIS — Z8673 Personal history of transient ischemic attack (TIA), and cerebral infarction without residual deficits: Secondary | ICD-10-CM

## 2019-02-23 DIAGNOSIS — I63441 Cerebral infarction due to embolism of right cerebellar artery: Secondary | ICD-10-CM

## 2019-02-23 DIAGNOSIS — D72829 Elevated white blood cell count, unspecified: Secondary | ICD-10-CM | POA: Diagnosis not present

## 2019-02-23 DIAGNOSIS — C9110 Chronic lymphocytic leukemia of B-cell type not having achieved remission: Secondary | ICD-10-CM | POA: Insufficient documentation

## 2019-02-23 DIAGNOSIS — E782 Mixed hyperlipidemia: Secondary | ICD-10-CM

## 2019-02-23 MED ORDER — ATORVASTATIN CALCIUM 40 MG PO TABS: 40.0000 mg | ORAL_TABLET | Freq: Every day | ORAL | 3 refills | Status: DC

## 2019-02-23 NOTE — Assessment & Plan Note (Signed)
Jason Abbott returns, he is doing well, he had an acute cerebellar stroke with presenting symptoms of confusion and incoordination which resolved. This is thought to be more of an embolic shower rather than a single embolism as a stroke in the cerebellum should not cause confusion. Ultimately I placed him on Plavix at the last visit, he did see neurology, it is recommended we continue this for a full 3 months before switching to aspirin alone. We started the Plavix January 21 so we will continue this until April 21 and then switch to baby aspirin.

## 2019-02-23 NOTE — Progress Notes (Addendum)
    Procedures performed today:    None.  Independent interpretation of tests performed by another provider:   None.  Impression and Recommendations:    History of stroke involving cerebellum Jason Abbott returns, he is doing well, he had an acute cerebellar stroke with presenting symptoms of confusion and incoordination which resolved. This is thought to be more of an embolic shower rather than a single embolism as a stroke in the cerebellum should not cause confusion. Ultimately I placed him on Plavix at the last visit, he did see neurology, it is recommended we continue this for a full 3 months before switching to aspirin alone. We started the Plavix January 21 so we will continue this until April 21 and then switch to baby aspirin.   Hyperlipidemia, mixed Stroke treatment will involve aggressive lipid treatment. Refilling atorvastatin high-dose.  Leukocytosis Jason Abbott was noted to have leukocytosis in the hospital. It had improved to 14,000 at the last check, rechecking today. If persistently elevated he will need a peripheral smear and further evaluation for a chronic leukemia which I think is unlikely.  Increased leukocytosis, I would like an opinion from heme-onc.    ___________________________________________ Gwen Her. Dianah Field, M.D., ABFM., CAQSM. Primary Care and [place] Instructor of [place] of [facility] of Medicine

## 2019-02-23 NOTE — Patient Instructions (Signed)
May discontinue Plavix on January 27, 2019

## 2019-02-23 NOTE — Assessment & Plan Note (Signed)
Stroke treatment will involve aggressive lipid treatment. Refilling atorvastatin high-dose.

## 2019-02-23 NOTE — Assessment & Plan Note (Addendum)
Jason Abbott was noted to have leukocytosis in the hospital. It had improved to 14,000 at the last check, rechecking today. If persistently elevated he will need a peripheral smear and further evaluation for a chronic leukemia which I think is unlikely.  Increased leukocytosis, I would like an opinion from heme-onc.

## 2019-02-24 LAB — CBC
HCT: 43.2 % (ref 38.5–50.0)
Hemoglobin: 14.7 g/dL (ref 13.2–17.1)
MCH: 31.5 pg (ref 27.0–33.0)
MCHC: 34 g/dL (ref 32.0–36.0)
MCV: 92.5 fL (ref 80.0–100.0)
MPV: 12.1 fL (ref 7.5–12.5)
Platelets: 138 10*3/uL — ABNORMAL LOW (ref 140–400)
RBC: 4.67 10*6/uL (ref 4.20–5.80)
RDW: 12.7 % (ref 11.0–15.0)
WBC: 16.7 10*3/uL — ABNORMAL HIGH (ref 3.8–10.8)

## 2019-02-24 NOTE — Addendum Note (Signed)
Addended by: Silverio Decamp on: 02/24/2019 08:30 AM   Modules accepted: Orders

## 2019-02-28 ENCOUNTER — Other Ambulatory Visit: Payer: Self-pay | Admitting: Hematology

## 2019-02-28 DIAGNOSIS — Z114 Encounter for screening for human immunodeficiency virus [HIV]: Secondary | ICD-10-CM

## 2019-02-28 DIAGNOSIS — D72820 Lymphocytosis (symptomatic): Secondary | ICD-10-CM

## 2019-02-28 DIAGNOSIS — Z7289 Other problems related to lifestyle: Secondary | ICD-10-CM

## 2019-02-28 NOTE — Progress Notes (Signed)
[place] CONSULT NOTE  Patient Care Team: Silverio Decamp, MD as PCP - General (Family Medicine) Silverio Decamp, MD as Consulting Physician (Sports Medicine)  HEME/ONC OVERVIEW: 1. Leukocytosis -WBC 12-16k since 2020, no diff; WBC nl in 2017 with lymphocytic predominance   2. Thrombocytopenia -Plts 120-150k's since 2017  ASSESSMENT & PLAN:   Leukocytosis -I reviewed the patient's records in detail, including PCP clinic notes and lab studies -In summary, patient has had mild leukocytosis his WBC between 12 and 16k since 2020, but there was no differential.  He had one CBC in 2017 that showed normal WBC, but there was mild lymphocytic predominance (lymphocytes 54%, neutrophils 35%).  He also has had mild intermittent thrombocytopenia with platelet count between 100 and 150k since 2017.  The patient was referred to the hematology for further evaluation. -I reviewed the lab results in detail with the patient, as well as some of the common causes of leukocytosis, including infection, inflammation, and less commonly, malignancy -WBC 11.6k today, stable  -I personally reviewed the patient's peripheral blood smear today.  The red blood cells were of normal morphology.  There was no schistocytosis.  There were scattered atypical lymphocytes with smudge cells, suggestive of CLL. There were no peripheral circulating blasts. The platelets were of normal size and I verified that there were no platelet clumping. -I have ordered infectious studies, including HIV, Hep B/C serologies -In light of the mild lymphocytic predominance in 2017 and the presence of smudge cells on the peripheral blood smear, chronic lymphocytic leukemia is on the differential diagnosis, and therefore I have ordered peripheral blood flow cytometry -I spent some time discussing with the patient and his spouse the pathophysiology, diagnosis, treatment and prognosis of CLL (if it is confirmed) -If CLL is  confirmed, then he will need additional studies to risk stratify the disease, as well as CT CAP w/ contrast to assess for any lymphadenopathy and organomegaly  -If peripheral blood flow cytometry is negative for monoclonal populations, then we can obtain labs to rule out myeloproliferative neoplasm, including BCR/ABL FISH and MPN NGS -Finally, I counseled the patient on some of the concerning symptoms, including but not limited to, persistent fever (T-max > 100.4), drenching night sweats, unexplained weight loss, or progressive lymphadenopathy, for which he is instructed to seek care promptly  Thrombocytopenia -Chronic since 2017, fluctuating between 120-150k's -Plts 127k today, stable -Patient denies any symptoms of bleeding -See the work-up for CLL above   Orders Placed This Encounter  Procedures  . CBC with Differential (Cancer Center Only)    Standing Status:   Future    Standing Expiration Date:   04/06/2020  . CMP ([facility] only)    Standing Status:   Future    Standing Expiration Date:   04/06/2020  . Save Smear ([facility])    Standing Status:   Future    Standing Expiration Date:   03/02/2020  . Lactate dehydrogenase    Standing Status:   Future    Standing Expiration Date:   04/06/2020   The total time spent in the encounter was 65 minutes, including face-to-face time with the patient, review of various tests results, order additional studies/medications, documentation, and coordination of care plan.   All questions were answered. The patient knows to call the clinic with any problems, questions or concerns. No barriers to learning was detected.  Tish Men, MD 2/25/20211:07 PM  CHIEF COMPLAINTS/PURPOSE OF CONSULTATION:  "I am doing fine"  HISTORY OF PRESENTING ILLNESS:  Jason Abbott. 74 y.o. male is here because of chronic leukocytosis with lymphocytic predominance.  Patient was diagnosed with "mini stroke" in early 2021, and that at that time, CBC showed an incidental mild  worsening of chronic leukocytosis (WBC ~20k).  Review of CBCs dating back to 2017 showed mild intermittent leukocytosis.  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  REVIEW OF SYSTEMS:   Constitutional: ( - ) fevers, ( - )  chills , ( - ) night sweats Eyes: ( - ) blurriness of vision, ( - ) double vision, ( - ) watery eyes Ears, nose, mouth, throat, and face: ( - ) mucositis, ( - ) sore throat Respiratory: ( - ) cough, ( - ) dyspnea, ( - ) wheezes Cardiovascular: ( - ) palpitation, ( - ) chest discomfort, ( - ) lower extremity swelling Gastrointestinal:  ( - ) nausea, ( - ) heartburn, ( - ) change in bowel habits Skin: ( - ) abnormal skin rashes Lymphatics: ( - ) new lymphadenopathy, ( - ) easy bruising Neurological: ( - ) numbness, ( - ) tingling, ( - ) new weaknesses Behavioral/Psych: ( - ) mood change, ( - ) new changes  All other systems were reviewed with the patient and are negative.  I have reviewed his chart and materials related to his cancer extensively and collaborated history with the patient. Summary of oncologic history is as follows: Oncology History   No history exists.    MEDICAL HISTORY:  Past Medical History:  Diagnosis Date  . Bowel obstruction ([place])   . 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 H/O urinary infection   . Stroke [facility]) 01/2019    SURGICAL HISTORY: Past Surgical History:  Procedure Laterality Date  . HERNIA REPAIR    . MECKEL DIVERTICULUM EXCISION    . ORIF FINGER / THUMB FRACTURE      SOCIAL HISTORY: Social History   Socioeconomic History  . Marital status: Married    Spouse name: [name]  . Number of children: Not on file  . Years of education: Not on file  . Highest  education level: Some college, no degree  Occupational History    Comment: retired Corporate treasurer, Biomedical scientist, works part time  Tobacco Use  . Smoking status: Former Smoker    Packs/day: 0.50    Years: 23.00    Pack years: 11.50    Quit date: 03/05/1988    Years since quitting: 31.0  . Smokeless tobacco: Never Used  Substance and Sexual Activity  . Alcohol use: Yes    Alcohol/week: 6.0 standard drinks    Types: 6 Cans of beer per week  . Drug use: No  . Sexual activity: Not on file  Other Topics Concern  . Not on file  Social History Narrative   lives with wife   Caffeine- coffee 1-2 daily   Social Determinants of Health   Financial Resource Strain:   . Difficulty of Paying Living Expenses: Not on file  Food Insecurity:   . Worried About Charity fundraiser in the Last Year: Not on file  . Ran Out of Food in the Last Year: Not on file  Transportation Needs:   . Lack of Transportation (Medical): Not on file  . Lack of Transportation (  Non-Medical): Not on file  Physical Activity:   . Days of Exercise per Week: Not on file  . Minutes of Exercise per Session: Not on file  Stress:   . Feeling of Stress : Not on file  Social Connections:   . Frequency of Communication with Friends and Family: Not on file  . Frequency of Social Gatherings with Friends and Family: Not on file  . Attends Religious Services: Not on file  . Active Member of Clubs or Organizations: Not on file  . Attends Archivist Meetings: Not on file  . Marital Status: Not on file  Intimate Partner Violence:   . Fear of Current or Ex-Partner: Not on file  . Emotionally Abused: Not on file  . Physically Abused: Not on file  . Sexually Abused: Not on file    FAMILY HISTORY: Family History  Problem Relation Age of Onset  . Diabetes Sister   . Cancer Mother        pancreatic  . Diabetes Brother   . Diabetes Brother     ALLERGIES:  has No Known Allergies.  MEDICATIONS:  Current Outpatient Medications   Medication Sig Dispense Refill  . aspirin EC 81 MG tablet Take 1 tablet (81 mg total) by mouth daily. 90 tablet 3  . atorvastatin (LIPITOR) 40 MG tablet Take 1 tablet (40 mg total) by mouth daily at 6 PM. 90 tablet 3  . clopidogrel (PLAVIX) 75 MG tablet Take 1 tablet (75 mg total) by mouth daily. 30 tablet 3  . magnesium oxide (MAG-OX) 400 MG tablet Take 2 tablets (800 mg total) by mouth 2 (two) times daily. 180 tablet 3   No current facility-administered medications for this visit.    PHYSICAL EXAMINATION: ECOG PERFORMANCE STATUS: 0 - Asymptomatic  Vitals:   03/03/19 1230  BP: 133/79  Pulse: 60  Resp: 18  Temp: (!) 97.3 F (36.3 C)  SpO2: 98%   Filed Weights   03/03/19 1230  Weight: 203 lb (92.1 kg)    GENERAL: alert, no distress and comfortable SKIN: skin color, texture, turgor are normal, no rashes or significant lesions EYES: conjunctiva are pink and non-injected, sclera clear OROPHARYNX: no exudate, no erythema; lips, buccal mucosa, and tongue normal  NECK: supple, non-tender LYMPH:  no palpable lymphadenopathy in the cervical LUNGS: clear to auscultation with normal breathing effort HEART: regular rate & rhythm, no murmurs, no lower extremity edema ABDOMEN: soft, non-tender, non-distended, normal bowel sounds Musculoskeletal: no cyanosis of digits and no clubbing  PSYCH: alert & oriented x 3, fluent speech  LABORATORY DATA:  I have reviewed the data as listed Lab Results  Component Value Date   WBC 11.6 (H) 03/03/2019   HGB 14.5 03/03/2019   HCT 44.5 03/03/2019   MCV 94.9 03/03/2019   PLT 127 (L) 03/03/2019   Lab Results  Component Value Date   NA 142 03/03/2019   K 4.8 03/03/2019   CL 104 03/03/2019   CO2 33 (H) 03/03/2019    RADIOGRAPHIC STUDIES: I have personally reviewed the radiological images as listed and agreed with the findings in the report. No results found.  PATHOLOGY: I have reviewed the pathology reports as documented in the  oncologist history.

## 2019-03-03 ENCOUNTER — Other Ambulatory Visit: Payer: Self-pay

## 2019-03-03 ENCOUNTER — Encounter: Payer: Self-pay | Admitting: Hematology

## 2019-03-03 ENCOUNTER — Inpatient Hospital Stay: Payer: Medicare Other | Attending: Hematology | Admitting: Hematology

## 2019-03-03 ENCOUNTER — Inpatient Hospital Stay: Payer: Medicare Other

## 2019-03-03 VITALS — BP 133/79 | HR 60 | Temp 97.3°F | Resp 18 | Ht 71.0 in | Wt 203.0 lb

## 2019-03-03 DIAGNOSIS — Z833 Family history of diabetes mellitus: Secondary | ICD-10-CM | POA: Insufficient documentation

## 2019-03-03 DIAGNOSIS — D696 Thrombocytopenia, unspecified: Secondary | ICD-10-CM | POA: Diagnosis not present

## 2019-03-03 DIAGNOSIS — Z114 Encounter for screening for human immunodeficiency virus [HIV]: Secondary | ICD-10-CM

## 2019-03-03 DIAGNOSIS — Z87891 Personal history of nicotine dependence: Secondary | ICD-10-CM | POA: Insufficient documentation

## 2019-03-03 DIAGNOSIS — Z7982 Long term (current) use of aspirin: Secondary | ICD-10-CM | POA: Diagnosis not present

## 2019-03-03 DIAGNOSIS — D72820 Lymphocytosis (symptomatic): Secondary | ICD-10-CM

## 2019-03-03 DIAGNOSIS — Z79899 Other long term (current) drug therapy: Secondary | ICD-10-CM | POA: Diagnosis not present

## 2019-03-03 DIAGNOSIS — Z800 Family history of malignant neoplasm of digestive organs: Secondary | ICD-10-CM | POA: Insufficient documentation

## 2019-03-03 DIAGNOSIS — Z8673 Personal history of transient ischemic attack (TIA), and cerebral infarction without residual deficits: Secondary | ICD-10-CM | POA: Insufficient documentation

## 2019-03-03 DIAGNOSIS — D72829 Elevated white blood cell count, unspecified: Secondary | ICD-10-CM | POA: Diagnosis not present

## 2019-03-03 DIAGNOSIS — Z7289 Other problems related to lifestyle: Secondary | ICD-10-CM

## 2019-03-03 LAB — CBC WITH DIFFERENTIAL (CANCER CENTER ONLY)
Abs Immature Granulocytes: 0 10*3/uL (ref 0.00–0.07)
Basophils Absolute: 0.1 10*3/uL (ref 0.0–0.1)
Basophils Relative: 1 %
Eosinophils Absolute: 0.2 10*3/uL (ref 0.0–0.5)
Eosinophils Relative: 2 %
HCT: 44.5 % (ref 39.0–52.0)
Hemoglobin: 14.5 g/dL (ref 13.0–17.0)
Immature Granulocytes: 0 %
Lymphocytes Relative: 69 %
Lymphs Abs: 8.1 10*3/uL — ABNORMAL HIGH (ref 0.7–4.0)
MCH: 30.9 pg (ref 26.0–34.0)
MCHC: 32.6 g/dL (ref 30.0–36.0)
MCV: 94.9 fL (ref 80.0–100.0)
Monocytes Absolute: 1.6 10*3/uL — ABNORMAL HIGH (ref 0.1–1.0)
Monocytes Relative: 14 %
Neutro Abs: 1.6 10*3/uL — ABNORMAL LOW (ref 1.7–7.7)
Neutrophils Relative %: 14 %
Platelet Count: 127 10*3/uL — ABNORMAL LOW (ref 150–400)
RBC: 4.69 MIL/uL (ref 4.22–5.81)
RDW: 13 % (ref 11.5–15.5)
WBC Count: 11.6 10*3/uL — ABNORMAL HIGH (ref 4.0–10.5)
nRBC: 0 % (ref 0.0–0.2)

## 2019-03-03 LAB — CMP (CANCER CENTER ONLY)
ALT: 25 U/L (ref 0–44)
AST: 20 U/L (ref 15–41)
Albumin: 4.6 g/dL (ref 3.5–5.0)
Alkaline Phosphatase: 71 U/L (ref 38–126)
Anion gap: 5 (ref 5–15)
BUN: 17 mg/dL (ref 8–23)
CO2: 33 mmol/L — ABNORMAL HIGH (ref 22–32)
Calcium: 9.6 mg/dL (ref 8.9–10.3)
Chloride: 104 mmol/L (ref 98–111)
Creatinine: 1.17 mg/dL (ref 0.61–1.24)
GFR, Est AFR Am: >60 mL/min (ref >60)
GFR, Estimated: >60 mL/min (ref >60)
Glucose, Bld: 102 mg/dL — ABNORMAL HIGH (ref 70–99)
Potassium: 4.8 mmol/L (ref 3.5–5.1)
Sodium: 142 mmol/L (ref 135–145)
Total Bilirubin: 0.7 mg/dL (ref 0.3–1.2)
Total Protein: 6.9 g/dL (ref 6.5–8.1)

## 2019-03-03 LAB — HIV ANTIBODY (ROUTINE TESTING W REFLEX): HIV Screen 4th Generation wRfx: NONREACTIVE

## 2019-03-03 LAB — HEPATITIS B SURFACE ANTIBODY,QUALITATIVE: Hep B S Ab: NONREACTIVE

## 2019-03-03 LAB — HEPATITIS B SURFACE ANTIGEN: Hepatitis B Surface Ag: NONREACTIVE

## 2019-03-03 LAB — HEPATITIS B CORE ANTIBODY, TOTAL: Hep B Core Total Ab: NONREACTIVE

## 2019-03-03 LAB — LACTATE DEHYDROGENASE: LDH: 182 U/L (ref 98–192)

## 2019-03-03 LAB — SAVE SMEAR(SSMR), FOR PROVIDER SLIDE REVIEW

## 2019-03-04 ENCOUNTER — Encounter: Payer: Self-pay | Admitting: Hematology

## 2019-03-04 ENCOUNTER — Other Ambulatory Visit: Payer: Self-pay | Admitting: Hematology

## 2019-03-04 DIAGNOSIS — C9110 Chronic lymphocytic leukemia of B-cell type not having achieved remission: Secondary | ICD-10-CM

## 2019-03-04 LAB — FLOW CYTOMETRY

## 2019-03-04 LAB — SURGICAL PATHOLOGY

## 2019-03-11 DIAGNOSIS — Z20828 Contact with and (suspected) exposure to other viral communicable diseases: Secondary | ICD-10-CM | POA: Diagnosis not present

## 2019-03-14 ENCOUNTER — Encounter: Payer: Self-pay | Admitting: Sports Medicine

## 2019-03-14 ENCOUNTER — Telehealth (INDEPENDENT_AMBULATORY_CARE_PROVIDER_SITE_OTHER): Payer: Medicare Other | Admitting: Sports Medicine

## 2019-03-14 DIAGNOSIS — R6889 Other general symptoms and signs: Secondary | ICD-10-CM | POA: Insufficient documentation

## 2019-03-14 DIAGNOSIS — Z7982 Long term (current) use of aspirin: Secondary | ICD-10-CM | POA: Diagnosis not present

## 2019-03-14 DIAGNOSIS — R69 Illness, unspecified: Secondary | ICD-10-CM | POA: Insufficient documentation

## 2019-03-14 DIAGNOSIS — Z87891 Personal history of nicotine dependence: Secondary | ICD-10-CM | POA: Diagnosis not present

## 2019-03-14 MED ORDER — HYDROCODONE-ACETAMINOPHEN 5-325 MG PO TABS: 1.0000 | ORAL_TABLET | Freq: Three times a day (TID) | ORAL | 0 refills | Status: DC | PRN

## 2019-03-14 NOTE — Assessment & Plan Note (Addendum)
Jason Abbott is a pleasant 74 year old male, he has a history of CVA, CLL. More recently last week he got his second COVID-19 vaccine, the next day he developed low-grade fevers, headaches, muscle aches, lack of appetite. A few days after that he was exposed to a healthcare facility patient that was positive for COVID-19. He went to [facility] on Friday and had a swab, the results are not back yet. He has a mild cough, productive of yellowish sputum, headache, low-grade fevers below 100 degrees. Ibuprofen and Tylenol at home not effective for his headache, no focal neurologic symptoms, symmetrical face on the video camera, adding hydrocodone. He is speaking full sentences on the phone, we will await his COVID-19 test, if it comes back positive he would need to see one of our urgent cares providers, if it comes back negative I be happy to see him, it sounds as though he is lost 6 or 7 pounds, he would certainly be a candidate for IV fluids here in the office, as well as a good physical exam. Of note we will be avoiding steroids as he did just have his second COVID-19 vaccine.

## 2019-03-14 NOTE — Progress Notes (Signed)
   Virtual Visit via WebEx/MyChart   I connected with  Jason Abbott.  on 03/14/19 via WebEx/MyChart/Doximity Video and verified that I am speaking with the correct person using two identifiers.   I discussed the limitations, risks, security and privacy concerns of performing an evaluation and management service by WebEx/MyChart/Doximity Video, including the higher likelihood of inaccurate diagnosis and treatment, and the availability of in person appointments.  We also discussed the likely need of an additional face to face encounter for complete and high quality delivery of care.  I also discussed with the patient that there may be a patient responsible charge related to this service. The patient expressed understanding and wishes to proceed.  Provider location is either at home or medical facility. Patient location is at their home, different from provider location. People involved in care of the patient during this telehealth encounter were myself, my nurse/medical assistant, and my front office/scheduling team member.  Review of Systems: No fevers, chills, night sweats, weight loss, chest pain, or shortness of breath.   Objective Findings:    General: Speaking full sentences, no audible heavy breathing.  Sounds alert and appropriately interactive.  Appears well.  Face symmetric.  Extraocular movements intact.  Pupils equal and round.  No nasal flaring or accessory muscle use visualized.  Independent interpretation of tests performed by another provider:   None.  Impression and Recommendations:    Jason Abbott is a pleasant 74 year old male, he has a history of CVA, CLL. More recently last week he got his second COVID-19 vaccine, the next day he developed low-grade fevers, headaches, muscle aches, lack of appetite. A few days after that he was exposed to a healthcare facility patient that was positive for COVID-19. [facility] and [facility]  swab, the results are not back yet. He has a mild cough, productive of yellowish sputum, headache, low-grade fevers below 100 degrees. Ibuprofen and Tylenol at home not effective for his headache, no focal neurologic symptoms, symmetrical face on the video camera, adding hydrocodone. He is speaking full sentences on the phone, we will await his COVID-19 test, if it comes back positive he would need to see one of our urgent cares providers, if it comes back negative I be happy to see him, it sounds as though he is lost 6 or 7 pounds, he would certainly be a candidate for IV fluids here in the office, as well as a good physical exam. Of note we will be avoiding steroids as he did just have his second COVID-19 vaccine.   I discussed the above assessment and treatment plan with the patient. The patient was provided an opportunity to ask questions and all were answered. The patient agreed with the plan and demonstrated an understanding of the instructions.   The patient was advised to call back or seek an in-person evaluation if the symptoms worsen or if the condition fails to improve as anticipated.   I provided 30 minutes of face to face and non-face-to-face time during this encounter date, time was needed to gather information, review chart, records, communicate/coordinate with staff remotely, as well as complete documentation.   ___________________________________________ Gwen Her. Dianah Field, M.D., ABFM., CAQSM. Primary Care and [place] Instructor of [place] of [facility]

## 2019-03-15 DIAGNOSIS — R0602 Shortness of breath: Secondary | ICD-10-CM | POA: Diagnosis not present

## 2019-03-15 DIAGNOSIS — Z8673 Personal history of transient ischemic attack (TIA), and cerebral infarction without residual deficits: Secondary | ICD-10-CM | POA: Diagnosis not present

## 2019-03-15 DIAGNOSIS — R001 Bradycardia, unspecified: Secondary | ICD-10-CM | POA: Diagnosis not present

## 2019-03-15 DIAGNOSIS — U071 COVID-19: Secondary | ICD-10-CM | POA: Diagnosis not present

## 2019-03-15 DIAGNOSIS — R0902 Hypoxemia: Secondary | ICD-10-CM | POA: Diagnosis not present

## 2019-03-15 DIAGNOSIS — I712 Thoracic aortic aneurysm, without rupture: Secondary | ICD-10-CM | POA: Diagnosis not present

## 2019-03-15 DIAGNOSIS — R590 Localized enlarged lymph nodes: Secondary | ICD-10-CM | POA: Diagnosis not present

## 2019-03-15 DIAGNOSIS — J9601 Acute respiratory failure with hypoxia: Secondary | ICD-10-CM | POA: Diagnosis not present

## 2019-03-15 DIAGNOSIS — R509 Fever, unspecified: Secondary | ICD-10-CM | POA: Diagnosis not present

## 2019-03-15 DIAGNOSIS — J1282 Pneumonia due to coronavirus disease 2019: Secondary | ICD-10-CM | POA: Diagnosis not present

## 2019-03-15 DIAGNOSIS — E782 Mixed hyperlipidemia: Secondary | ICD-10-CM | POA: Diagnosis not present

## 2019-03-15 DIAGNOSIS — Z209 Contact with and (suspected) exposure to unspecified communicable disease: Secondary | ICD-10-CM | POA: Diagnosis not present

## 2019-03-16 ENCOUNTER — Other Ambulatory Visit: Payer: Medicare Other

## 2019-03-16 ENCOUNTER — Ambulatory Visit: Payer: Medicare Other | Admitting: Cardiothoracic Surgery

## 2019-03-16 DIAGNOSIS — Z8673 Personal history of transient ischemic attack (TIA), and cerebral infarction without residual deficits: Secondary | ICD-10-CM | POA: Diagnosis not present

## 2019-03-16 DIAGNOSIS — E782 Mixed hyperlipidemia: Secondary | ICD-10-CM | POA: Diagnosis present

## 2019-03-16 DIAGNOSIS — J9601 Acute respiratory failure with hypoxia: Secondary | ICD-10-CM | POA: Diagnosis present

## 2019-03-16 DIAGNOSIS — R7989 Other specified abnormal findings of blood chemistry: Secondary | ICD-10-CM | POA: Diagnosis present

## 2019-03-16 DIAGNOSIS — I712 Thoracic aortic aneurysm, without rupture: Secondary | ICD-10-CM | POA: Diagnosis present

## 2019-03-16 DIAGNOSIS — J1282 Pneumonia due to coronavirus disease 2019: Secondary | ICD-10-CM | POA: Diagnosis present

## 2019-03-16 DIAGNOSIS — Z79899 Other long term (current) drug therapy: Secondary | ICD-10-CM | POA: Diagnosis not present

## 2019-03-16 DIAGNOSIS — D696 Thrombocytopenia, unspecified: Secondary | ICD-10-CM | POA: Diagnosis present

## 2019-03-16 DIAGNOSIS — R001 Bradycardia, unspecified: Secondary | ICD-10-CM | POA: Diagnosis present

## 2019-03-16 DIAGNOSIS — D72829 Elevated white blood cell count, unspecified: Secondary | ICD-10-CM | POA: Diagnosis present

## 2019-03-16 DIAGNOSIS — U071 COVID-19: Secondary | ICD-10-CM

## 2019-03-16 DIAGNOSIS — J189 Pneumonia, unspecified organism: Secondary | ICD-10-CM | POA: Insufficient documentation

## 2019-03-16 DIAGNOSIS — Z87891 Personal history of nicotine dependence: Secondary | ICD-10-CM | POA: Diagnosis not present

## 2019-03-16 MED ORDER — HYDROCODONE-ACETAMINOPHEN 10-325 MG PO TABS
1.00 | ORAL_TABLET | ORAL | Status: DC
Start: ? — End: 2019-03-16

## 2019-03-16 MED ORDER — GENERIC EXTERNAL MEDICATION
Status: DC
Start: ? — End: 2019-03-16

## 2019-03-16 MED ORDER — ALBUTEROL SULFATE HFA 108 (90 BASE) MCG/ACT IN AERS
2.00 | INHALATION_SPRAY | RESPIRATORY_TRACT | Status: DC
Start: ? — End: 2019-03-16

## 2019-03-16 MED ORDER — ASPIRIN 81 MG PO CHEW
81.00 | CHEWABLE_TABLET | ORAL | Status: DC
Start: 2019-03-24 — End: 2019-03-16

## 2019-03-16 MED ORDER — SODIUM CHLORIDE 0.9 % IV SOLN
30.00 | INTRAVENOUS | Status: DC
Start: 2019-03-18 — End: 2019-03-16

## 2019-03-16 MED ORDER — DEXAMETHASONE 4 MG PO TABS
6.00 | ORAL_TABLET | ORAL | Status: DC
Start: 2019-03-19 — End: 2019-03-16

## 2019-03-16 MED ORDER — ATORVASTATIN CALCIUM 40 MG PO TABS
40.00 | ORAL_TABLET | ORAL | Status: DC
Start: 2019-03-23 — End: 2019-03-16

## 2019-03-16 MED ORDER — CLOPIDOGREL BISULFATE 75 MG PO TABS
75.00 | ORAL_TABLET | ORAL | Status: DC
Start: 2019-03-24 — End: 2019-03-16

## 2019-03-16 MED ORDER — HYDRALAZINE HCL 20 MG/ML IJ SOLN
10.00 | INTRAMUSCULAR | Status: DC
Start: ? — End: 2019-03-16

## 2019-03-16 MED ORDER — GUAIFENESIN 100 MG/5ML PO SYRP
200.00 | ORAL_SOLUTION | ORAL | Status: DC
Start: ? — End: 2019-03-16

## 2019-03-16 MED ORDER — POLYETHYLENE GLYCOL 3350 17 GM/SCOOP PO POWD
17.00 | ORAL | Status: DC
Start: ? — End: 2019-03-16

## 2019-03-16 MED ORDER — SODIUM CHLORIDE 0.9 % IV SOLN
10.00 | INTRAVENOUS | Status: DC
Start: ? — End: 2019-03-16

## 2019-03-16 MED ORDER — ENOXAPARIN SODIUM 40 MG/0.4ML ~~LOC~~ SOLN
0.50 | SUBCUTANEOUS | Status: DC
Start: 2019-03-23 — End: 2019-03-16

## 2019-03-16 MED ORDER — GENERIC EXTERNAL MEDICATION
100.00 | Status: DC
Start: 2019-03-18 — End: 2019-03-16

## 2019-03-16 MED ORDER — HYDROCODONE-ACETAMINOPHEN 5-325 MG PO TABS
1.00 | ORAL_TABLET | ORAL | Status: DC
Start: ? — End: 2019-03-16

## 2019-03-16 MED ORDER — MUPIROCIN 2 % EX OINT
TOPICAL_OINTMENT | CUTANEOUS | Status: DC
Start: 2019-03-18 — End: 2019-03-16

## 2019-03-18 MED ORDER — GENERIC EXTERNAL MEDICATION
Status: DC
Start: ? — End: 2019-03-18

## 2019-03-18 MED ORDER — CALCIUM CARBONATE ANTACID 500 MG PO CHEW
500.00 | CHEWABLE_TABLET | ORAL | Status: DC
Start: ? — End: 2019-03-18

## 2019-03-18 MED ORDER — PANTOPRAZOLE SODIUM 40 MG PO TBEC
40.00 | DELAYED_RELEASE_TABLET | ORAL | Status: DC
Start: 2019-03-24 — End: 2019-03-18

## 2019-03-21 ENCOUNTER — Other Ambulatory Visit: Payer: Medicare Other

## 2019-03-24 MED ORDER — METHYLPREDNISOLONE SODIUM SUCC 40 MG IJ SOLR
40.00 | INTRAMUSCULAR | Status: DC
Start: 2019-03-24 — End: 2019-03-24

## 2019-03-24 MED ORDER — GENERIC EXTERNAL MEDICATION
Status: DC
Start: ? — End: 2019-03-24

## 2019-03-24 MED ORDER — CHOLECALCIFEROL 25 MCG (1000 UT) PO TABS
2000.00 | ORAL_TABLET | ORAL | Status: DC
Start: 2019-03-24 — End: 2019-03-24

## 2019-03-24 MED ORDER — THERA PO TABS
1.00 | ORAL_TABLET | ORAL | Status: DC
Start: 2019-03-24 — End: 2019-03-24

## 2019-03-30 ENCOUNTER — Ambulatory Visit (INDEPENDENT_AMBULATORY_CARE_PROVIDER_SITE_OTHER): Payer: Medicare Other | Admitting: Sports Medicine

## 2019-03-30 ENCOUNTER — Encounter: Payer: Self-pay | Admitting: Sports Medicine

## 2019-03-30 ENCOUNTER — Other Ambulatory Visit: Payer: Self-pay

## 2019-03-30 DIAGNOSIS — I63441 Cerebral infarction due to embolism of right cerebellar artery: Secondary | ICD-10-CM

## 2019-03-30 DIAGNOSIS — R0602 Shortness of breath: Secondary | ICD-10-CM

## 2019-03-30 DIAGNOSIS — J849 Interstitial pulmonary disease, unspecified: Secondary | ICD-10-CM

## 2019-03-30 DIAGNOSIS — J1282 Pneumonia due to coronavirus disease 2019: Secondary | ICD-10-CM | POA: Diagnosis not present

## 2019-03-30 DIAGNOSIS — U071 COVID-19: Secondary | ICD-10-CM | POA: Diagnosis not present

## 2019-03-30 LAB — COMPLETE METABOLIC PANEL WITH GFR
AG Ratio: 1.4 (calc) (ref 1.0–2.5)
ALT: 35 U/L (ref 9–46)
AST: 18 U/L (ref 10–35)
Albumin: 3.6 g/dL (ref 3.6–5.1)
Alkaline phosphatase (APISO): 75 U/L (ref 35–144)
BUN: 13 mg/dL (ref 7–25)
CO2: 31 mmol/L (ref 20–32)
Calcium: 8.6 mg/dL (ref 8.6–10.3)
Chloride: 100 mmol/L (ref 98–110)
Creat: 0.89 mg/dL (ref 0.70–1.18)
GFR, Est African American: 97 mL/min/{1.73_m2} (ref >=60)
GFR, Est Non African American: 84 mL/min/{1.73_m2} (ref >=60)
Globulin: 2.6 g/dL (calc) (ref 1.9–3.7)
Glucose, Bld: 96 mg/dL (ref 65–99)
Potassium: 5.5 mmol/L — ABNORMAL HIGH (ref 3.5–5.3)
Sodium: 137 mmol/L (ref 135–146)
Total Bilirubin: 0.8 mg/dL (ref 0.2–1.2)
Total Protein: 6.2 g/dL (ref 6.1–8.1)

## 2019-03-30 LAB — CBC WITH DIFFERENTIAL/PLATELET
Absolute Monocytes: 1789 cells/uL — ABNORMAL HIGH (ref 200–950)
Basophils Absolute: 42 cells/uL (ref 0–200)
Basophils Relative: 0.2 %
Eosinophils Absolute: 125 cells/uL (ref 15–500)
Eosinophils Relative: 0.6 %
HCT: 45.4 % (ref 38.5–50.0)
Hemoglobin: 15 g/dL (ref 13.2–17.1)
Lymphs Abs: 14082 cells/uL — ABNORMAL HIGH (ref 850–3900)
MCH: 30.6 pg (ref 27.0–33.0)
MCHC: 33 g/dL (ref 32.0–36.0)
MCV: 92.7 fL (ref 80.0–100.0)
MPV: 11.5 fL (ref 7.5–12.5)
Monocytes Relative: 8.6 %
Neutro Abs: 4763 cells/uL (ref 1500–7800)
Neutrophils Relative %: 22.9 %
Platelets: 132 10*3/uL — ABNORMAL LOW (ref 140–400)
RBC: 4.9 10*6/uL (ref 4.20–5.80)
RDW: 13.1 % (ref 11.0–15.0)
Total Lymphocyte: 67.7 %
WBC: 20.8 10*3/uL — ABNORMAL HIGH (ref 3.8–10.8)

## 2019-03-30 MED ORDER — PREDNISONE 10 MG (48) PO TBPK: ORAL_TABLET | Freq: Every day | ORAL | 0 refills | Status: DC

## 2019-03-30 MED ORDER — ANORO ELLIPTA 62.5-25 MCG/INH IN AEPB: 1.0000 | INHALATION_SPRAY | Freq: Every day | RESPIRATORY_TRACT | 11 refills | Status: DC

## 2019-03-30 NOTE — Assessment & Plan Note (Signed)
Maro returns, he is out of the hospital now, he had Covid pneumonia, he is on chronic O2 requirement, 3.5 to 4 L getting his oxygen levels up to 96%. He was in the 76% at rest on room air. I am going to try to get him humidified O2, he is having some nosebleeds, he will continue to use nasal Vaseline. Adding some labs, he had some electrolyte disturbances. I am also going to add a 12-day prednisone taper, high-resolution CT, Anoro, and referral to pulmonology.

## 2019-03-30 NOTE — Progress Notes (Signed)
    Procedures performed today:    None.  Independent interpretation of notes and tests performed by another provider:   I did review his hospitalization records including his treatment in the hospital with remdesivir, Decadron, imaging.  Impression and Recommendations:    Pneumonia due to COVID-19 virus Jaspreet returns, he is out of the hospital now, he had Covid pneumonia, he is on chronic O2 requirement, 3.5 to 4 L getting his oxygen levels up to 96%. He was in the 76% at rest on room air. I am going to try to get him humidified O2, he is having some nosebleeds, he will continue to use nasal Vaseline. Adding some labs, he had some electrolyte disturbances. I am also going to add a 12-day prednisone taper, high-resolution CT, Anoro, and referral to pulmonology.     ___________________________________________ Gwen Her. Dianah Field, M.D., ABFM., CAQSM. Primary Care and [place] Instructor of [place] of [facility]

## 2019-03-31 ENCOUNTER — Ambulatory Visit (INDEPENDENT_AMBULATORY_CARE_PROVIDER_SITE_OTHER): Payer: Medicare Other

## 2019-03-31 DIAGNOSIS — J1282 Pneumonia due to coronavirus disease 2019: Secondary | ICD-10-CM

## 2019-03-31 DIAGNOSIS — J849 Interstitial pulmonary disease, unspecified: Secondary | ICD-10-CM | POA: Diagnosis not present

## 2019-03-31 DIAGNOSIS — R0602 Shortness of breath: Secondary | ICD-10-CM | POA: Diagnosis not present

## 2019-03-31 DIAGNOSIS — Z8619 Personal history of other infectious and parasitic diseases: Secondary | ICD-10-CM | POA: Diagnosis not present

## 2019-03-31 DIAGNOSIS — U071 COVID-19: Secondary | ICD-10-CM

## 2019-03-31 DIAGNOSIS — R918 Other nonspecific abnormal finding of lung field: Secondary | ICD-10-CM | POA: Diagnosis not present

## 2019-03-31 NOTE — Telephone Encounter (Signed)
Routing to provider  

## 2019-04-01 MED ORDER — HUMIDIFIER MISC: 0 refills | Status: DC

## 2019-04-07 ENCOUNTER — Ambulatory Visit: Payer: Medicare Other | Admitting: Hematology

## 2019-04-07 ENCOUNTER — Other Ambulatory Visit: Payer: Medicare Other

## 2019-04-11 ENCOUNTER — Other Ambulatory Visit: Payer: Self-pay

## 2019-04-11 ENCOUNTER — Ambulatory Visit (INDEPENDENT_AMBULATORY_CARE_PROVIDER_SITE_OTHER): Payer: Medicare Other

## 2019-04-11 DIAGNOSIS — C9110 Chronic lymphocytic leukemia of B-cell type not having achieved remission: Secondary | ICD-10-CM

## 2019-04-11 MED ORDER — IOHEXOL 300 MG/ML  SOLN
100.0000 mL | Freq: Once | INTRAMUSCULAR | Status: AC | PRN
  Administered 2019-04-11: 15:00:00 100 mL via INTRAVENOUS

## 2019-04-15 ENCOUNTER — Inpatient Hospital Stay: Payer: Medicare Other

## 2019-04-15 ENCOUNTER — Inpatient Hospital Stay: Payer: Medicare Other | Admitting: Hematology

## 2019-04-19 ENCOUNTER — Inpatient Hospital Stay (HOSPITAL_BASED_OUTPATIENT_CLINIC_OR_DEPARTMENT_OTHER): Payer: Medicare Other | Admitting: Hematology

## 2019-04-19 ENCOUNTER — Other Ambulatory Visit: Payer: Self-pay

## 2019-04-19 ENCOUNTER — Encounter: Payer: Self-pay | Admitting: Hematology

## 2019-04-19 ENCOUNTER — Inpatient Hospital Stay: Payer: Medicare Other | Attending: Hematology

## 2019-04-19 VITALS — BP 133/87 | HR 78 | Temp 97.3°F | Resp 20 | Ht 71.0 in | Wt 186.0 lb

## 2019-04-19 DIAGNOSIS — Z8616 Personal history of COVID-19: Secondary | ICD-10-CM | POA: Insufficient documentation

## 2019-04-19 DIAGNOSIS — R918 Other nonspecific abnormal finding of lung field: Secondary | ICD-10-CM

## 2019-04-19 DIAGNOSIS — Z7982 Long term (current) use of aspirin: Secondary | ICD-10-CM | POA: Diagnosis not present

## 2019-04-19 DIAGNOSIS — D696 Thrombocytopenia, unspecified: Secondary | ICD-10-CM | POA: Diagnosis not present

## 2019-04-19 DIAGNOSIS — C9110 Chronic lymphocytic leukemia of B-cell type not having achieved remission: Secondary | ICD-10-CM | POA: Insufficient documentation

## 2019-04-19 DIAGNOSIS — Z79899 Other long term (current) drug therapy: Secondary | ICD-10-CM | POA: Diagnosis not present

## 2019-04-19 DIAGNOSIS — D72829 Elevated white blood cell count, unspecified: Secondary | ICD-10-CM | POA: Diagnosis not present

## 2019-04-19 LAB — CBC WITH DIFFERENTIAL (CANCER CENTER ONLY)
Abs Immature Granulocytes: 0.02 10*3/uL (ref 0.00–0.07)
Basophils Absolute: 0 10*3/uL (ref 0.0–0.1)
Basophils Relative: 0 %
Eosinophils Absolute: 0.1 10*3/uL (ref 0.0–0.5)
Eosinophils Relative: 1 %
HCT: 39.9 % (ref 39.0–52.0)
Hemoglobin: 12.9 g/dL — ABNORMAL LOW (ref 13.0–17.0)
Immature Granulocytes: 0 %
Lymphocytes Relative: 61 %
Lymphs Abs: 5.2 10*3/uL — ABNORMAL HIGH (ref 0.7–4.0)
MCH: 30.4 pg (ref 26.0–34.0)
MCHC: 32.3 g/dL (ref 30.0–36.0)
MCV: 94.1 fL (ref 80.0–100.0)
Monocytes Absolute: 0.9 10*3/uL (ref 0.1–1.0)
Monocytes Relative: 10 %
Neutro Abs: 2.4 10*3/uL (ref 1.7–7.7)
Neutrophils Relative %: 28 %
Platelet Count: 97 10*3/uL — ABNORMAL LOW (ref 150–400)
RBC: 4.24 MIL/uL (ref 4.22–5.81)
RDW: 14.2 % (ref 11.5–15.5)
WBC Count: 8.6 10*3/uL (ref 4.0–10.5)
nRBC: 0 % (ref 0.0–0.2)

## 2019-04-19 LAB — CMP (CANCER CENTER ONLY)
ALT: 22 U/L (ref 0–44)
AST: 22 U/L (ref 15–41)
Albumin: 3.5 g/dL (ref 3.5–5.0)
Alkaline Phosphatase: 67 U/L (ref 38–126)
Anion gap: 8 (ref 5–15)
BUN: 9 mg/dL (ref 8–23)
CO2: 29 mmol/L (ref 22–32)
Calcium: 9 mg/dL (ref 8.9–10.3)
Chloride: 102 mmol/L (ref 98–111)
Creatinine: 1.09 mg/dL (ref 0.61–1.24)
GFR, Est AFR Am: >60 mL/min (ref >60)
GFR, Estimated: >60 mL/min (ref >60)
Glucose, Bld: 154 mg/dL — ABNORMAL HIGH (ref 70–99)
Potassium: 3.9 mmol/L (ref 3.5–5.1)
Sodium: 139 mmol/L (ref 135–145)
Total Bilirubin: 0.5 mg/dL (ref 0.3–1.2)
Total Protein: 5.7 g/dL — ABNORMAL LOW (ref 6.5–8.1)

## 2019-04-19 LAB — SAVE SMEAR(SSMR), FOR PROVIDER SLIDE REVIEW

## 2019-04-19 NOTE — Progress Notes (Signed)
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 Thrombocytopenia -Plts 120-150k's since 2017  ASSESSMENT & PLAN:   Stage 0 CLL -I independently reviewed the radiologic images of the recent CT chest, abdomen and pelvis, and agree with the findings documented.  In summary, CT showed no evidence of lymphadenopathy or hepatosplenomegaly.  There was an area of architectural distortion in the left lower lobe, as well as scattered areas of groundglass attenuation bilaterally, possibly due to history of Covid infection.  -I reviewed the pathology and imaging results in detail with patient -In the absence of any significant lymphadenopathy, hepatosplenomegaly, anemia, or thrombocytopenia, this is consistent with Stage 0 CLL -I have ordered CLL FISH panel, including t(11;14), and IGHV gene mutation panel to complete the risk stratification -I discussed with the patient some of the concerning symptoms, including but not limited to, persistent fever (T-max > 100.4), drenching night sweats, unexplained weight loss, or progressive lymphadenopathy, for which he is instructed to seek care promptly  Thrombocytopenia -Chronic since 2017, fluctuating between 120-150k's -Plts 97k today, slightly lower than the last visit  -Patient denies any symptoms of bleeding or excess bruising -I personally reviewed the patient's peripheral blood smear today.  The red blood cells were of normal morphology.  There was no schistocytosis.  There were scattered atypical lymphocytes, c/w CLL.  There were no peripheral circulating  blasts. The platelets were of normal size and I verified that there were no platelet clumping. -Given that the patient was recently hospitalized for several days for Covid pneumonia, this may be due to consumption in the setting of infectino -We will repeat labs in 3 months to monitor the platelet count  -If he develops progressive thrombocytopenia, especially if platelet falls below 50-75k, ITP should be ruled out and he may warrant a trial of pulse dose steroid  Nodular opacities in the left lung -As discussed above, the architectural distortion in the left lower lobe, as well as scattered bilateral groundglass opacities, is most likely due to recent Covid infection in 03/2019  -We will plan to repeat CT chest w/o contrast in 3 months to monitor any interval changes  Orders Placed This Encounter  Procedures  . CT CHEST WO CONTRAST    Standing Status:   Future    Standing Expiration Date:   04/18/2020    Order Specific Question:   ** REASON FOR EXAM (FREE TEXT)    Answer:   Abnormal CT chest, follow up interval changes    Order Specific Question:   Preferred imaging location?    Answer:   [facility] Specific Question:   Radiology Contrast Protocol - do NOT remove file path    Answer:   [email or web address]  . CBC with Differential ([facility] Only)    Standing Status:   Future    Standing Expiration Date:   05/23/2020  . CMP ([facility] only)    Standing Status:   Future    Standing Expiration Date:   05/23/2020  . Lactate dehydrogenase    Standing Status:   Future    Standing Expiration Date:  05/23/2020  . Save Smear ([facility])    Standing Status:   Future    Standing Expiration Date:   04/18/2020    The total time spent in the encounter was 45 minutes, including face-to-face time with the patient, review of various tests results, order additional studies/medications, documentation, and coordination of care plan.   All questions were  answered. The patient knows to call the clinic with any problems, questions or concerns. No barriers to learning was detected.  Return in 3 months for labs and clinic follow-up.   Jason Men, MD 4/13/20212:50 PM  CHIEF COMPLAINT: "I am doing fine"  INTERVAL HISTORY: Jason Abbott returns clinic for follow-up of CLL.  Patient was recently admitted for Covid pneumonia in 03/2019, and had a very lengthy hospitalization.  He was discharged on home oxygen due to persistent hypoxia.  Since discharge, he has been wearing his oxygen intermittently, but during the day, he does not wear his oxygen for several hours at a time.  He usually checks his oxygen saturation level before he takes off his oxygen and just before he puts his oxygen back on, but he does not check his saturation level during the day when he is off oxygen.  He had occasional night sweat and chills, but denies any persistent unexplained fever, drenching night sweats, or lymphadenopathy.  He denies any other complaint today.  REVIEW OF SYSTEMS:   Constitutional: ( - ) fevers, ( + )  chills , ( + ) occasional night sweats Eyes: ( - ) blurriness of vision, ( - ) double vision, ( - ) watery eyes Ears, nose, mouth, throat, and face: ( - ) mucositis, ( - ) sore throat Respiratory: ( - ) cough, ( + ) dyspnea, ( - ) wheezes Cardiovascular: ( - ) palpitation, ( - ) chest discomfort, ( - ) lower extremity swelling Gastrointestinal:  ( - ) nausea, ( - ) heartburn, ( - ) change in bowel habits Skin: ( - ) abnormal skin rashes Lymphatics: ( - ) new lymphadenopathy, ( - ) easy bruising Neurological: ( - ) numbness, ( - ) tingling, ( - ) new weaknesses Behavioral/Psych: ( - ) mood change, ( - ) new changes  All other systems were reviewed with the patient and are negative.  SUMMARY OF ONCOLOGIC HISTORY: Oncology History  CLL (chronic lymphocytic leukemia) ([facility])  02/23/2019 Initial Diagnosis   CLL (chronic lymphocytic leukemia) ([place])   03/03/2019  Pathologic Stage   Flow cytometry:  DIAGNOSIS:   -  Monoclonal B-cell population identified.   COMMENT:   The overall findings favor chronic lymphocytic leukemia.  Clinical  correlation is recommended.    GATING AND PHENOTYPIC ANALYSIS:   Gated population: Flow cytometric immunophenotyping is performed using  antibodies to the antigens listed in the table below. Electronic gates  are placed around a cell cluster displaying light scatter properties  corresponding to: lymphocytes (total lymphocyte count 8.14 K/uL)   Abnormal Cells in gated population: 68%   Phenotype of Abnormal Cells: CD5, CD19, CD20, CD200, Lambda    03/31/2019 Imaging   CT chest: IMPRESSION: 1. The appearance of the lungs is compatible with reported COVID-19 infection, with some areas that likely reflect residual pneumonia and other areas which have an appearance most compatible with evolving post infectious scarring, with a morphology of cryptogenic organizing pneumonia (COP) which is commonly seen in the setting of resolved COVID-19 infection. 2. In addition, in the left lower lobe there is a 2.2 x 2.4 cm nodular  appearing area. This may simply be part of the underlying evolving post infectious process, however, the possibility of neoplasm is not excluded and close attention on short-term repeat noncontrast chest CT is recommended in 3 months to re-evaluate this finding. 3. Aortic atherosclerosis, in addition to left main and 2 vessel coronary artery disease. Assessment for potential risk factor modification, dietary therapy or pharmacologic therapy may be warranted, if clinically indicated. 4. 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   03/31/2019 Imaging   CT abdomen/pelvis: IMPRESSION: No suspicious lymphadenopathy in this patient with new diagnosis of CLL.    Spleen is normal in size.   Additional ancillary findings as above.     I have reviewed the past medical history, past surgical history, social history and family history with the patient and they are unchanged from previous note.  ALLERGIES:  has No Known Allergies.  MEDICATIONS:  Current Outpatient Medications  Medication Sig Dispense Refill  . aspirin EC 81 MG tablet Take 1 tablet (81 mg total) by mouth daily. 90 tablet 3  . atorvastatin (LIPITOR) 40 MG tablet Take 1 tablet (40 mg total) by mouth daily at 6 PM. 90 tablet 3  . clopidogrel (PLAVIX) 75 MG tablet Take 1 tablet (75 mg total) by mouth daily. 30 tablet 3  . Humidifier MISC Humidifier to use with oxygen/nasal cannula 1 each 0  . MAGNESIUM-OXIDE PO Take 400 mg by mouth daily.     [name] umeclidinium-vilanterol (ANORO ELLIPTA) 62.5-25 MCG/INH AEPB Inhale 1 puff into the lungs daily. 60 each 11  . albuterol (VENTOLIN HFA) 108 (90 Base) MCG/ACT inhaler Inhale into the lungs.     No current facility-administered medications for this visit.    PHYSICAL EXAMINATION: ECOG PERFORMANCE STATUS: 1 - Symptomatic but completely ambulatory  Today's Vitals   04/19/19 1438  BP: 133/87  Pulse: 78  Resp: 20  Temp: (!) 97.3 F (36.3 C)  TempSrc: Temporal  SpO2: 100%  Weight: 186 lb (84.4 kg)  Height: 5\' 11"  (1.803 m)  PainSc: 0-No pain   Body mass index is 25.94 kg/m.  Filed Weights   04/19/19 1438  Weight: 186 lb (84.4 kg)    GENERAL: alert, no distress and comfortable on supplement O2  SKIN: skin color, texture, turgor are normal, no rashes or significant lesions EYES: conjunctiva are pink and non-injected, sclera clear OROPHARYNX: no exudate, no erythema; lips, buccal mucosa, and tongue normal  NECK: supple, non-tender LUNGS: clear to auscultation with normal breathing effort HEART: regular rate & rhythm and no murmurs and no lower extremity edema ABDOMEN: soft, non-tender, non-distended, normal bowel  sounds Musculoskeletal: no cyanosis of digits and no clubbing  PSYCH: alert & oriented x 3, fluent speech  LABORATORY DATA:  I have reviewed the data as listed    Component Value Date/Time   NA 139 04/19/2019 1356   K 3.9 04/19/2019 1356   CL 102 04/19/2019 1356   CO2 29 04/19/2019 1356   GLUCOSE 154 (H) 04/19/2019 1356   BUN 9 04/19/2019 1356   CREATININE 1.09 04/19/2019 1356   CREATININE 0.89 03/30/2019 1603   CALCIUM 9.0 04/19/2019 1356   PROT 5.7 (L) 04/19/2019 1356   ALBUMIN 3.5 04/19/2019 1356   AST 22 04/19/2019 1356   ALT 22 04/19/2019 1356   ALKPHOS 67 04/19/2019 1356   BILITOT 0.5 04/19/2019 1356   GFRNONAA >60 04/19/2019 1356  GFRNONAA 84 03/30/2019 1603   GFRAA >60 04/19/2019 1356   GFRAA 97 03/30/2019 1603    No results found for: SPEP, UPEP  Lab Results  Component Value Date   WBC 8.6 04/19/2019   NEUTROABS 2.4 04/19/2019   HGB 12.9 (L) 04/19/2019   HCT 39.9 04/19/2019   MCV 94.1 04/19/2019   PLT 97 (L) 04/19/2019      Chemistry      Component Value Date/Time   NA 139 04/19/2019 1356   K 3.9 04/19/2019 1356   CL 102 04/19/2019 1356   CO2 29 04/19/2019 1356   BUN 9 04/19/2019 1356   CREATININE 1.09 04/19/2019 1356   CREATININE 0.89 03/30/2019 1603      Component Value Date/Time   CALCIUM 9.0 04/19/2019 1356   ALKPHOS 67 04/19/2019 1356   AST 22 04/19/2019 1356   ALT 22 04/19/2019 1356   BILITOT 0.5 04/19/2019 1356       RADIOGRAPHIC STUDIES: I have personally reviewed the radiological images as listed below and agreed with the findings in the report. CT ABDOMEN PELVIS W CONTRAST  Result Date: 04/11/2019 CLINICAL DATA:  Recently diagnosed CLL, for staging EXAM: CT ABDOMEN AND PELVIS WITH CONTRAST TECHNIQUE: Multidetector CT imaging of the abdomen and pelvis was performed using the standard protocol following bolus administration of intravenous contrast. CONTRAST:  159mL OMNIPAQUE IOHEXOL 300 MG/ML  SOLN COMPARISON:  None. FINDINGS: Lower  chest: Subpleural patchy opacities at the lung bases, post infectious inflammatory scarring in this patient with recent COVID pneumonia. Hepatobiliary: Numerous hepatic cysts bilaterally, measuring up to 4.5 cm. Gallbladder is unremarkable. No intrahepatic or extrahepatic ductal dilatation. Pancreas: Within normal limits. Spleen: Within normal limits. Adrenals/Urinary Tract: Adrenal glands are within normal limits. Subcentimeter renal cysts bilaterally.  No hydronephrosis. Bladder is within normal limits. Stomach/Bowel: Stomach is within normal limits. No evidence of bowel obstruction. Appendix is not discretely visualized. Scattered mild sigmoid diverticulosis, without evidence of diverticulitis. Vascular/Lymphatic: No evidence of abdominal aortic aneurysm. Atherosclerotic calcifications of the abdominal aorta and branch vessels. No suspicious abdominopelvic lymphadenopathy. Reproductive: Prostate is unremarkable. Other: No abdominopelvic ascites. Postsurgical changes related to prior left inguinal hernia repair. Musculoskeletal: Degenerative changes of the visualized thoracolumbar spine. Grade 1 spondylolisthesis at L5-S1. IMPRESSION: No suspicious lymphadenopathy in this patient with new diagnosis of CLL. Spleen is normal in size. Additional ancillary findings as above. Electronically Signed   By: Julian Hy M.D.   On: 04/11/2019 15:42   CT CHEST HIGH RESOLUTION  Result Date: 04/01/2019 CLINICAL DATA:  74 year old male with history of shortness of breath. COVID-19 pneumonia earlier this month diagnosed on 03/15/2019. EXAM: CT CHEST WITHOUT CONTRAST TECHNIQUE: Multidetector CT imaging of the chest was performed following the standard protocol without intravenous contrast. High resolution imaging of the lungs, as well as inspiratory and expiratory imaging, was performed. COMPARISON:  No priors. FINDINGS: Cardiovascular: Heart size is normal. There is no significant pericardial fluid, thickening or  pericardial calcification. There is aortic atherosclerosis, as well as atherosclerosis of the great vessels of the mediastinum and the coronary arteries, including calcified atherosclerotic plaque in the left main, left anterior descending and right coronary arteries. 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 Mediastinum/Nodes: No pathologically enlarged mediastinal or hilar lymph nodes. Please note that accurate exclusion of hilar adenopathy is limited on noncontrast CT scans. Several densely calcified anterior mediastinal lymph nodes are incidentally noted. Esophagus  is unremarkable in appearance. No axillary lymphadenopathy. Lungs/Pleura: Widespread patchy areas of ground-glass attenuation, septal thickening, thickening of the peribronchovascular interstitium and regional architectural distortion are noted throughout the lungs bilaterally. Several of the areas demonstrate some volume loss and relative sparing of the subpleural interstitium. No frank honeycombing confidently identified at this time. Findings have no definitive craniocaudal gradient. Inspiratory and expiratory imaging is unremarkable. No pleural effusions. In the left lower lobe (axial image 100 of series 3) there is a 2.2 x 2.4 cm nodular area of architectural distortion. A few other scattered tiny 2-4 mm pulmonary nodules are noted elsewhere in the lungs bilaterally, nonspecific. Upper Abdomen: Aortic atherosclerosis. Multiple low-attenuation lesions throughout the visualized hepatic parenchyma, incompletely characterized on today's non-contrast CT examination, but statistically likely to represent cysts, measuring up to 4.5 cm in segment 4A. Musculoskeletal: There are no aggressive appearing lytic or blastic lesions noted in the visualized portions of the skeleton. IMPRESSION: 1. The appearance of the  lungs is compatible with reported COVID-19 infection, with some areas that likely reflect residual pneumonia and other areas which have an appearance most compatible with evolving post infectious scarring, with a morphology of cryptogenic organizing pneumonia (COP) which is commonly seen in the setting of resolved COVID-19 infection. 2. In addition, in the left lower lobe there is a 2.2 x 2.4 cm nodular appearing area. This may simply be part of the underlying evolving post infectious process, however, the possibility of neoplasm is not excluded and close attention on short-term repeat noncontrast chest CT is recommended in 3 months to re-evaluate this finding. 3. Aortic atherosclerosis, in addition to left main and 2 vessel coronary artery disease. Assessment for potential risk factor modification, dietary therapy or pharmacologic therapy may be warranted, if clinically indicated. 4. 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 Aortic Atherosclerosis (ICD10-I70.0). Electronically Signed   By: [name] M.D.   On: 04/01/2019 08:45

## 2019-04-20 LAB — LACTATE DEHYDROGENASE: LDH: 329 U/L — ABNORMAL HIGH (ref 98–192)

## 2019-04-20 NOTE — Telephone Encounter (Signed)
Go ahead and get him in either with me or one of my partners, he is complex so it will likely need to be one of the physicians.

## 2019-04-20 NOTE — Telephone Encounter (Signed)
Routing to provider  

## 2019-04-28 ENCOUNTER — Other Ambulatory Visit: Payer: Self-pay

## 2019-04-28 ENCOUNTER — Emergency Department (INDEPENDENT_AMBULATORY_CARE_PROVIDER_SITE_OTHER)
Admission: EM | Admit: 2019-04-28 | Discharge: 2019-04-28 | Disposition: A | Discharge status: Home / Self Care | Payer: Medicare Other | Source: Home / Self Care

## 2019-04-28 DIAGNOSIS — R8290 Unspecified abnormal findings in urine: Secondary | ICD-10-CM

## 2019-04-28 LAB — POCT URINALYSIS DIP (MANUAL ENTRY)
Bilirubin, UA: NEGATIVE
Blood, UA: NEGATIVE
Glucose, UA: NEGATIVE mg/dL
Nitrite, UA: POSITIVE — AB
Protein Ur, POC: 30 mg/dL — AB
Spec Grav, UA: 1.025 (ref 1.010–1.025)
Urobilinogen, UA: 0.2 E.U./dL
pH, UA: 6.5 (ref 5.0–8.0)

## 2019-04-28 MED ORDER — CEPHALEXIN 500 MG PO CAPS: 500.0000 mg | ORAL_CAPSULE | Freq: Two times a day (BID) | ORAL | 0 refills | Status: DC

## 2019-04-28 NOTE — ED Triage Notes (Signed)
Released 3/17 from hosp after bout with COVID. Pt c/o strong odor to urine x 3 weeks. Hx of UTIs

## 2019-04-28 NOTE — Discharge Instructions (Signed)
  Please take your antibiotic as prescribed. A urine culture has been sent to check the severity of your urinary infection and to determine if you are on the most appropriate antibiotic. The results should come back within 2-3 days. You will only be notified if a medication change is indicated.  Please follow up with family medicine or urology if not improving within 1 week, sooner if symptoms worsening.   

## 2019-04-28 NOTE — ED Provider Notes (Signed)
[name]    CSN: [id number] Arrival date & time: 04/28/19  1338      History   Chief Complaint No chief complaint on file.   HPI Jason Abbott. is a 74 y.o. male.   HPI Jason Abbott. is a 74 y.o. male presenting to UC with c/o 3 weeks gradually worsening malodorous urine and mild dysuria. Hx of recurrent UTIs. Pt was hospitalized for 9 days due to covid complications. He was discharged on 3/17.  He reports intermittent low grade fevers and chills since being discharged.  Denies n/v/d.     Past Medical History:  Diagnosis Date  . Bowel obstruction ([place])   . 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 H/O urinary infection   . Stroke [facility]) 01/2019    Patient Active Problem List   Diagnosis Date Noted  . Pneumonia due to COVID-19 virus 03/16/2019  . Feeling sick 03/14/2019  . 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/27/2019  . Leukocytosis 01/23/2019  . Low back pain 12/21/2018  . Left hand pain 10/20/2018  . Hyperlipidemia, mixed 06/01/2018  . Seborrheic keratosis 01/12/2018  . 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  . Cubital tunnel syndrome 03/09/2017  . Annual physical exam 04/26/2015  . Prostatitis with Urosepsis 05/11/2014  . Bacterial infection due to Klebsiella pneumoniae 04/20/2014  . Pyelonephritis 04/17/2014  . Combined forms of age-related cataract of left eye 03/23/2014  . Regular astigmatism of left eye 03/23/2014  . Peptic ulcer disease 02/23/2014  . Shingles outbreak 10/03/2013  . Right shoulder pain 10/03/2013    Past Surgical History:  Procedure Laterality Date  . HERNIA REPAIR    . MECKEL DIVERTICULUM EXCISION    . ORIF FINGER / THUMB FRACTURE         Home Medications    Prior to Admission medications   Medication Sig Start Date End Date Taking? Authorizing Provider   albuterol (VENTOLIN HFA) 108 (90 Base) MCG/ACT inhaler Inhale into the lungs. 03/23/19   [provider]  aspirin EC 81 MG tablet Take 1 tablet (81 mg total) by mouth daily. 01/27/19   Silverio Decamp, MD  atorvastatin (LIPITOR) 40 MG tablet Take 1 tablet (40 mg total) by mouth daily at 6 PM. 02/23/19   Silverio Decamp, MD  cephALEXin (KEFLEX) 500 MG capsule Take 1 capsule (500 mg total) by mouth 2 (two) times daily. 04/28/19   Noe Gens, PA-C  clopidogrel (PLAVIX) 75 MG tablet Take 1 tablet (75 mg total) by mouth daily. 01/27/19   Silverio Decamp, MD  Humidifier MISC Humidifier to use with oxygen/nasal cannula 04/01/19   Silverio Decamp, MD  MAGNESIUM-OXIDE PO Take 400 mg by mouth daily.     [provider]  umeclidinium-vilanterol (ANORO ELLIPTA) 62.5-25 MCG/INH AEPB Inhale 1 puff into the lungs daily. 03/30/19   Silverio Decamp, MD    Family History Family History  Problem Relation Age of Onset  . Diabetes Sister   . Cancer Mother        pancreatic  . Diabetes Brother   . Diabetes Brother     Social History Social History   Tobacco Use  . Smoking status: Former Smoker    Packs/day: 0.50    Years: 23.00    Pack years: 11.50    Quit date: 03/05/1988    Years  since quitting: 31.1  . Smokeless tobacco: Never Used  Substance Use Topics  . Alcohol use: Yes    Alcohol/week: 6.0 standard drinks    Types: 6 Cans of beer per week  . Drug use: No     Allergies   Patient has no known allergies.   Review of Systems Review of Systems  Gastrointestinal: Negative for abdominal pain, diarrhea, nausea and vomiting.  Genitourinary: Positive for dysuria and frequency. Negative for decreased urine volume, flank pain, hematuria, testicular pain and urgency.  Neurological: Negative for dizziness and headaches.     Physical Exam Triage Vital Signs ED Triage Vitals [04/28/19 1351]  Enc Vitals Group     BP (!) 145/87     Pulse Rate  80     Resp 18     Temp 97.9 F (36.6 C)     Temp Source Oral     SpO2 90 %     Weight      Height      Head Circumference      Peak Flow      Pain Score      Pain Loc      Pain Edu?      Excl. in [place]?    No data found.  Updated Vital Signs BP (!) 145/87 (BP Location: Right Arm)   Pulse 80   Temp 97.9 F (36.6 C) (Oral)   Resp 18   SpO2 90%   Visual Acuity Right Eye Distance:   Left Eye Distance:   Bilateral Distance:    Right Eye Near:   Left Eye Near:    Bilateral Near:     Physical Exam Vitals and nursing note reviewed.  Constitutional:      Appearance: Normal appearance. He is well-developed.  HENT:     Head: Normocephalic and atraumatic.     Mouth/Throat:     Mouth: Mucous membranes are moist.  Cardiovascular:     Rate and Rhythm: Normal rate and regular rhythm.  Pulmonary:     Effort: Pulmonary effort is normal. No respiratory distress.     Breath sounds: Normal breath sounds.  Abdominal:     General: There is no distension.     Palpations: Abdomen is soft.     Tenderness: There is no abdominal tenderness. There is no right CVA tenderness or left CVA tenderness.  Musculoskeletal:        General: Normal range of motion.     Cervical back: Normal range of motion.  Skin:    General: Skin is warm and dry.  Neurological:     Mental Status: He is alert and oriented to person, place, and time.  Psychiatric:        Behavior: Behavior normal.      UC Treatments / Results  Labs (all labs ordered are listed, but only abnormal results are displayed) Labs Reviewed  POCT URINALYSIS DIP (MANUAL ENTRY) - Abnormal; Notable for the following components:      Result Value   Clarity, UA cloudy (*)    Ketones, POC UA trace (5) (*)    Protein Ur, POC =30 (*)    Nitrite, UA Positive (*)    Leukocytes, UA Small (1+) (*)    All other components within normal limits  URINE CULTURE    EKG   Radiology No results found.  Procedures Procedures (including  critical care time)  Medications Ordered in UC Medications - No data to display  Initial Impression / Assessment and Plan /  UC Course  I have reviewed the triage vital signs and the nursing notes.  Pertinent labs & imaging results that were available during my care of the patient were reviewed by me and considered in my medical decision making (see chart for details).     Hx and UA c/w UTI Culture sent Reviewed prior cultures, antibiotic sensitivities varies Will start pt on keflex for now Encouraged f/u with PCP  AVS provided  Final Clinical Impressions(s) / UC Diagnoses   Final diagnoses:  Abnormal finding on urinalysis     Discharge Instructions      Please take your antibiotic as prescribed. A urine culture has been sent to check the severity of your urinary infection and to determine if you are on the most appropriate antibiotic. The results should come back within 2-3 days. You will only be notified if a medication change is indicated.  Please follow up with family medicine or urology if not improving within 1 week, sooner if symptoms worsening.       ED Prescriptions    Medication Sig Dispense Auth. Provider   cephALEXin (KEFLEX) 500 MG capsule Take 1 capsule (500 mg total) by mouth 2 (two) times daily. 14 capsule Noe Gens, [place]     PDMP not reviewed this encounter.   Noe Gens, [place] 04/28/19 1606

## 2019-04-29 ENCOUNTER — Ambulatory Visit: Payer: Medicare Other | Admitting: Sports Medicine

## 2019-04-30 LAB — URINE CULTURE
MICRO NUMBER:: 10395361
SPECIMEN QUALITY:: ADEQUATE

## 2019-05-02 ENCOUNTER — Ambulatory Visit (INDEPENDENT_AMBULATORY_CARE_PROVIDER_SITE_OTHER): Payer: Medicare Other | Admitting: Pulmonary Disease

## 2019-05-02 ENCOUNTER — Encounter: Payer: Self-pay | Admitting: Pulmonary Disease

## 2019-05-02 ENCOUNTER — Other Ambulatory Visit: Payer: Self-pay

## 2019-05-02 VITALS — BP 126/74 | HR 85 | Temp 97.8°F

## 2019-05-02 DIAGNOSIS — R0602 Shortness of breath: Secondary | ICD-10-CM

## 2019-05-02 DIAGNOSIS — I63441 Cerebral infarction due to embolism of right cerebellar artery: Secondary | ICD-10-CM

## 2019-05-02 DIAGNOSIS — U071 COVID-19: Secondary | ICD-10-CM | POA: Diagnosis not present

## 2019-05-02 DIAGNOSIS — J1282 Pneumonia due to coronavirus disease 2019: Secondary | ICD-10-CM | POA: Diagnosis not present

## 2019-05-02 NOTE — Progress Notes (Signed)
Jason Abbott    [id number]    11-17-1945  Primary Care Physician:Thekkekandam, Gwen Her, MD  Referring Physician: Silverio Decamp, [address] [place],  [place] [address]  Chief complaint:  Consult for post covid  HPI: 74 year old with history of stroke.  Diagnosed with COVID-19 on 03/11/2019.  He was hospitalized at [facility] for 9 days requiring nasal cannula.  Treated with steroids, remdesivir and discharged on 4 L oxygen Post discharge he continues to have significant dyspnea on exertion.  Continues on supplemental oxygen He tried to wean himself off but had to restart oxygen due to desats.  He had a CT end of March which showed pulmonary fibrosis, cryptogenic organizing pneumonia.  He received 2 weeks of prednisone from Dr. Dianah Field his primary care  Pets: Has cats.  No birds Occupation: Works part-time in Theatre manager.  Retired Administrator.  He worked for 24 years in [employer] Exposures: No known exposures.  No mold, hot tub, Jacuzzi.  No down pillows or comforters Smoking history: 12-pack-year smoker.  Quit in 1989 Travel history: Originally from [place].  No significant recent travel Relevant family history: No significant family history of lung disease   Outpatient Encounter Medications as of 05/02/2019  Medication Sig  . albuterol (VENTOLIN HFA) 108 (90 Base) MCG/ACT inhaler Inhale into the lungs.  [name] aspirin EC 81 MG tablet Take 1 tablet (81 mg total) by mouth daily.  [name] atorvastatin (LIPITOR) 40 MG tablet Take 1 tablet (40 mg total) by mouth daily at 6 PM.  . cephALEXin (KEFLEX) 500 MG capsule Take 1 capsule (500 mg total) by mouth 2 (two) times daily.  . clopidogrel (PLAVIX) 75 MG tablet Take 1 tablet (75 mg total) by mouth daily.  . Humidifier MISC Humidifier to use with oxygen/nasal cannula  . MAGNESIUM-OXIDE PO Take 400 mg by mouth daily.   . Multiple Vitamin (MULTIVITAMIN) tablet Take 1 tablet by mouth daily.  [name]  umeclidinium-vilanterol (ANORO ELLIPTA) 62.5-25 MCG/INH AEPB Inhale 1 puff into the lungs daily.   No facility-administered encounter medications on file as of 05/02/2019.    Allergies as of 05/02/2019  . (No Known Allergies)    Past Medical History:  Diagnosis Date  . Bowel obstruction ([place])   . 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 H/O urinary infection   . Stroke [facility] 01/2019    Past Surgical History:  Procedure Laterality Date  . HERNIA REPAIR    . MECKEL DIVERTICULUM EXCISION    . ORIF FINGER / THUMB FRACTURE      Family History  Problem Relation Age of Onset  . Diabetes Sister   . Cancer Mother        pancreatic  . Diabetes Brother   . Diabetes Brother     Social History   Socioeconomic History  . Marital status: Married    Spouse name: [name]  . Number of children: Not on file  . Years of education: Not on file  . Highest education level: Some college, no degree  Occupational History    Comment: retired Corporate treasurer, Biomedical scientist, works part time  Tobacco Use  . Smoking status: Former Smoker    Packs/day: 0.50    Years: 23.00    Pack years: 11.50    Quit date: 03/05/1988    Years since quitting: 31.1  . Smokeless tobacco: Never Used  Substance and Sexual Activity  . Alcohol use:  Yes    Alcohol/week: 6.0 standard drinks    Types: 6 Cans of beer per week  . Drug use: No  . Sexual activity: Not on file  Other Topics Concern  . Not on file  Social History Narrative   lives with wife   Caffeine- coffee 1-2 daily   Social Determinants of Health   Financial Resource Strain:   . Difficulty of Paying Living Expenses:   Food Insecurity:   . Worried About Charity fundraiser in the Last Year:   . Arboriculturist in the Last Year:   Transportation Needs:   . Film/video editor (Medical):   [name] Lack of Transportation (Non-Medical):   Physical Activity:   . Days of Exercise per Week:   . Minutes of Exercise per Session:   Stress:    . Feeling of Stress :   Social Connections:   . Frequency of Communication with Friends and Family:   . Frequency of Social Gatherings with Friends and Family:   . Attends Religious Services:   . Active Member of Clubs or Organizations:   . Attends Archivist Meetings:   [name] Marital Status:   Intimate Partner Violence:   . Fear of Current or Ex-Partner:   . Emotionally Abused:   [name] Physically Abused:   . Sexually Abused:     Review of systems: Review of Systems  Constitutional: Negative for fever and chills.  HENT: Negative.   Eyes: Negative for blurred vision.  Respiratory: as per HPI  Cardiovascular: Negative for chest pain and palpitations.  Gastrointestinal: Negative for vomiting, diarrhea, blood per rectum. Genitourinary: Negative for dysuria, urgency, frequency and hematuria.  Musculoskeletal: Negative for myalgias, back pain and joint pain.  Skin: Negative for itching and rash.  Neurological: Negative for dizziness, tremors, focal weakness, seizures and loss of consciousness.  Endo/Heme/Allergies: Negative for environmental allergies.  Psychiatric/Behavioral: Negative for depression, suicidal ideas and hallucinations.  All other systems reviewed and are negative.  Physical Exam: Blood pressure 126/74, pulse 85, temperature 97.8 F (36.6 C), temperature source Temporal, SpO2 96 %. Gen:      No acute distress HEENT:  EOMI, sclera anicteric Neck:     No masses; no thyromegaly Lungs:    Clear to auscultation bilaterally; normal respiratory effort CV:         Regular rate and rhythm; no murmurs Abd:      + bowel sounds; soft, non-tender; no palpable masses, no distension Ext:    No edema; adequate peripheral perfusion Skin:      Warm and dry; no rash Neuro: alert and oriented x 3 Psych: normal mood and affect  Data Reviewed: Imaging: High-res CT 03/31/2019-patchy widespread groundglass opacities with architectural distortion, sparing of the subpleural space.   Alternate pattern with appearance of cryptogenic organizing pneumonia.  I have reviewed the images personally.  Assessment:  Post Covid 19 High-res CT reviewed with persistent inflammation, mild fibrotic changes and alternate pattern.  He has received a course of prednisone. We will get pulmonary function test and 6-minute walk for evaluation of the lung Continue supplemental oxygen.  Qualify for portable concentrator He will benefit from pulmonary rehab but prefers to exercise at home  Plan/Recommendations: - PFTs, 6-minute walk test - Supplemental oxygen, portable concentrator  Marshell Garfinkel MD [facility] 05/02/2019, 1:40 PM  CC: Silverio Decamp,*

## 2019-05-02 NOTE — Patient Instructions (Signed)
Will order pulmonary function test and 6-minute walk test for evaluation of your lungs Continue supplemental oxygen, exercise at home We will qualify you for a portable concentrator Follow-up in 3 months.

## 2019-05-04 ENCOUNTER — Ambulatory Visit (INDEPENDENT_AMBULATORY_CARE_PROVIDER_SITE_OTHER): Payer: Medicare Other | Admitting: Sports Medicine

## 2019-05-04 ENCOUNTER — Telehealth: Payer: Self-pay | Admitting: Pulmonary Disease

## 2019-05-04 DIAGNOSIS — J1282 Pneumonia due to coronavirus disease 2019: Secondary | ICD-10-CM

## 2019-05-04 DIAGNOSIS — U071 COVID-19: Secondary | ICD-10-CM

## 2019-05-04 DIAGNOSIS — I63441 Cerebral infarction due to embolism of right cerebellar artery: Secondary | ICD-10-CM | POA: Diagnosis not present

## 2019-05-04 MED ORDER — PREDNISONE 5 MG PO TBEC: 1.0000 | DELAYED_RELEASE_TABLET | Freq: Every day | ORAL | 3 refills | Status: DC

## 2019-05-04 MED ORDER — GUAIFENESIN ER 1200 MG PO TB12: 1.0000 | ORAL_TABLET | Freq: Two times a day (BID) | ORAL | 0 refills | Status: DC

## 2019-05-04 MED ORDER — BREZTRI AEROSPHERE 160-9-4.8 MCG/ACT IN AERO: 2.0000 | INHALATION_SPRAY | Freq: Two times a day (BID) | RESPIRATORY_TRACT | 11 refills | Status: DC

## 2019-05-04 NOTE — Addendum Note (Signed)
Addended by: Towana Badger on: 05/04/2019 02:43 PM   Modules accepted: Orders

## 2019-05-04 NOTE — Telephone Encounter (Signed)
Jason Abbott  We cannot provide a POC w/ a COVID diagnosis. Pt will need to be re-tested once he tests negative for COVID w/ a dx of a chronic lung disease in order to qualify for a POC.       Previous Messages   ----- Message -----  From: Harland German  Sent: 05/02/2019  4:32 PM EDT  To: Mady Gemma  Subject: POC                        08-27-1945  Order placed by Dr Vaughan Browner please advise. Thanks Chantel

## 2019-05-04 NOTE — Progress Notes (Addendum)
    Procedures performed today:    None.  Independent interpretation of notes and tests performed by another provider:   None.  Brief History, Exam, Impression, and Recommendations:    Pneumonia due to COVID-19 virus Jason Abbott returns, he had Covid pneumonia with chronic lung scarring on high-resolution CT. He is on a chronic O2 requirement, 3.5 to 4 L and that keeps his O2 sats at approximately 95 to 96%. He typically runs approximately 76% on room air. He has seen pulmonology. They are getting him set up for a portable oxygen concentrator. He does occasionally have some nosebleeds and does use nasal Vaseline, his O2 should be humidified. He felt excellent on his 12-day prednisone taper, both from an orthopedic and a pulmonary standpoint, Anoro may have helped to some degree. He will continue guaifenesin twice a day 1200 mg. For this reason we are going to add prednisone again, 5 mg daily, every day, switching to [place], discontinue Anoro. Return to see me in 1 month, we may increase to 10 mg of daily prednisone with the understanding that we will need to monitor him for osteoporosis and glucose intolerance.  It sounds as though a negative COVID-19 test is a requirement for portable oxygen concentrator, we are to do that test today to ensure a negative swab and forward results to his pulmonologist.   ___________________________________________ Jason Abbott. Jason Abbott, M.D., ABFM., CAQSM. Primary Care and [place] Instructor of [place] of [facility] of Medicine

## 2019-05-04 NOTE — Telephone Encounter (Signed)
Spoke with the pt and notified of the below response per Vallarie Mare  He verbalized understanding

## 2019-05-04 NOTE — Assessment & Plan Note (Addendum)
Volvy returns, he had Covid pneumonia with chronic lung scarring on high-resolution CT. He is on a chronic O2 requirement, 3.5 to 4 L and that keeps his O2 sats at approximately 95 to 96%. He typically runs approximately 76% on room air. He has seen pulmonology. They are getting him set up for a portable oxygen concentrator. He does occasionally have some nosebleeds and does use nasal Vaseline, his O2 should be humidified. He felt excellent on his 12-day prednisone taper, both from an orthopedic and a pulmonary standpoint, Anoro may have helped to some degree. He will continue guaifenesin twice a day 1200 mg. For this reason we are going to add prednisone again, 5 mg daily, every day, switching to [place], discontinue Anoro. Return to see me in 1 month, we may increase to 10 mg of daily prednisone with the understanding that we will need to monitor him for osteoporosis and glucose intolerance.  It sounds as though a negative COVID-19 test is a requirement for portable oxygen concentrator, we are to do that test today to ensure a negative swab and forward results to his pulmonologist.

## 2019-05-05 LAB — NOVEL CORONAVIRUS, NAA: SARS-CoV-2, NAA: NOT DETECTED

## 2019-05-05 LAB — SARS-COV-2, NAA 2 DAY TAT

## 2019-05-06 NOTE — Telephone Encounter (Signed)
Hello all, FYI we saw your note regarding the need for a negative Covid test before he qualifies for a POC, we obtained a negative Covid swab 2 days ago, hopefully this will be helpful.  ___________________________________________ Gwen Her. Dianah Field, M.D., ABFM., CAQSM. Primary Care and [place] Instructor of [place] of [facility] of Medicine

## 2019-05-10 ENCOUNTER — Telehealth: Payer: Self-pay | Admitting: Pulmonary Disease

## 2019-05-10 DIAGNOSIS — J1282 Pneumonia due to coronavirus disease 2019: Secondary | ICD-10-CM

## 2019-05-10 DIAGNOSIS — R0602 Shortness of breath: Secondary | ICD-10-CM

## 2019-05-10 NOTE — Telephone Encounter (Signed)
Spoke with patient's [name]. She stated that the patient did have a negative covid test on 4/28 that was ordered by his PCP. I advised her I would call [facility] to see if they would still be able to process the O2 order. She verbalized understanding.   Called [facility] at [phone] [facility]) no one answered. Called [facility] at [phone] [facility]) and spoke with [place]. She stated that they did receive an order for the patient but per their notes, patient is established with [facility] and has been established with them since 03/23/19.   Called [facility] at [phone] and spoke with Pam. She stated that the patient is established with their office for his O2.   Called Jason Abbott back to see if there was a reason the order was sent to [place]. She stated that she was not sure but since he is established with [facility], she wishes to have the order sent to them. Advised her that I would go ahead and place the order again. She verbalized understanding.   Nothing further needed at time of call.

## 2019-05-13 ENCOUNTER — Other Ambulatory Visit: Payer: Self-pay | Admitting: Hematology

## 2019-05-13 LAB — GENARRAY MOLECULAR KARYOTYPING FOR CLL

## 2019-05-13 LAB — FISH,CLL PROGNOSTIC PANEL

## 2019-05-18 ENCOUNTER — Telehealth: Payer: Medicare Other | Admitting: Nurse Practitioner

## 2019-05-18 DIAGNOSIS — N3000 Acute cystitis without hematuria: Secondary | ICD-10-CM | POA: Diagnosis not present

## 2019-05-18 MED ORDER — SULFAMETHOXAZOLE-TRIMETHOPRIM 800-160 MG PO TABS
1.0000 | ORAL_TABLET | Freq: Two times a day (BID) | ORAL | 0 refills | Status: DC
Start: 2019-05-18 — End: 2019-06-01

## 2019-05-18 NOTE — Progress Notes (Signed)
We are sorry that you are not feeling well.  Here is how we plan to help!  Based on what you shared with me it looks like you most likely have a simple urinary tract infection.  A UTI (Urinary Tract Infection) is a bacterial infection of the bladder.  Most cases of urinary tract infections are simple to treat but a key part of your care is to encourage you to drink plenty of fluids and watch your symptoms carefully.  * I reviewed your culture result from your previous ED visit and will go ahead and treat. If this doe not clear up you will need to see your PCP  I have prescribed Bactrim DS One tablet twice a day for 5 days.  Your symptoms should gradually improve. Call us if the burning in your urine worsens, you develop worsening fever, back pain or pelvic pain or if your symptoms do not resolve after completing the antibiotic.  Urinary tract infections can be prevented by drinking plenty of water to keep your body hydrated.  Also be sure when you wipe, wipe from front to back and don't hold it in!  If possible, empty your bladder every 4 hours.  Your e-visit answers were reviewed by a board certified advanced clinical practitioner to complete your personal care plan.  Depending on the condition, your plan could have included both over the counter or prescription medications.  If there is a problem please reply  once you have received a response from your provider.  Your safety is important to [place].  If you have drug allergies check your prescription carefully.    You can use MyChart to ask questions about today's visit, request a non-urgent call back, or ask for a work or school excuse for 24 hours related to this e-Visit. If it has been greater than 24 hours you will need to follow up with your provider, or enter a new e-Visit to address those concerns.   You will get an e-mail in the next two days asking about your experience.  I hope that your e-visit has been valuable and will speed your  recovery. Thank you for using e-visits.  5-10 minutes spent reviewing and documenting in chart.

## 2019-05-25 ENCOUNTER — Telehealth: Payer: Medicare Other | Admitting: Family

## 2019-05-25 DIAGNOSIS — L255 Unspecified contact dermatitis due to plants, except food: Secondary | ICD-10-CM | POA: Diagnosis not present

## 2019-05-25 MED ORDER — PREDNISONE 10 MG (21) PO TBPK
ORAL_TABLET | ORAL | 0 refills | Status: DC
Start: 2019-05-25 — End: 2019-06-01

## 2019-05-25 MED ORDER — TRIAMCINOLONE ACETONIDE 0.5 % EX OINT
1.0000 | TOPICAL_OINTMENT | Freq: Two times a day (BID) | CUTANEOUS | 0 refills | Status: DC
Start: 2019-05-25 — End: 2019-08-22

## 2019-05-25 NOTE — Progress Notes (Signed)

## 2019-06-01 ENCOUNTER — Encounter: Payer: Self-pay | Admitting: Sports Medicine

## 2019-06-01 ENCOUNTER — Ambulatory Visit (INDEPENDENT_AMBULATORY_CARE_PROVIDER_SITE_OTHER): Payer: Medicare Other | Admitting: Sports Medicine

## 2019-06-01 ENCOUNTER — Ambulatory Visit: Payer: Medicare Other | Admitting: Pulmonary Disease

## 2019-06-01 DIAGNOSIS — M79642 Pain in left hand: Secondary | ICD-10-CM

## 2019-06-01 DIAGNOSIS — U071 COVID-19: Secondary | ICD-10-CM

## 2019-06-01 DIAGNOSIS — I63441 Cerebral infarction due to embolism of right cerebellar artery: Secondary | ICD-10-CM | POA: Diagnosis not present

## 2019-06-01 DIAGNOSIS — J1282 Pneumonia due to coronavirus disease 2019: Secondary | ICD-10-CM | POA: Diagnosis not present

## 2019-06-01 NOTE — Assessment & Plan Note (Signed)
Jason Abbott returns, he continues to have occasional pain and spasm in his left hand particularly after working outside. He will increase his magnesium oxide to once in the morning and once at night. If this does not work we will have hand surgery take a look at him. I also increased his prednisone to 5 mg daily, every day, this will also help.

## 2019-06-01 NOTE — Progress Notes (Signed)
    Procedures performed today:    None.  Independent interpretation of notes and tests performed by another provider:   None.  Brief History, Exam, Impression, and Recommendations:    Left hand pain Jason Abbott returns, he continues to have occasional pain and spasm in his left hand particularly after working outside. He will increase his magnesium oxide to once in the morning and once at night. If this does not work we will have hand surgery take a look at him. I also increased his prednisone to 5 mg daily, every day, this will also help.  Pneumonia due to COVID-19 virus Jason Abbott also has a history of Covid pneumonia with chronic lung scarring on high-resolution CT. At the last visit we placed him on 5 mg of daily prednisone, and switched from Anoro to [place]. He is doing extremely well, his breathing has improved and he feels stronger every day. He checked his pulse off when outside working, and it was 95%.,  Its 94 to 96% at rest on room air, I do not think he needs oxygen anymore. Continue current treatment plan.    ___________________________________________ Gwen Her. Dianah Field, M.D., ABFM., CAQSM. Primary Care and [place] Instructor of [place] of [facility] of Medicine

## 2019-06-01 NOTE — Assessment & Plan Note (Signed)
Jason Abbott also has a history of Covid pneumonia with chronic lung scarring on high-resolution CT. At the last visit we placed him on 5 mg of daily prednisone, and switched from Anoro to [place]. He is doing extremely well, his breathing has improved and he feels stronger every day. He checked his pulse off when outside working, and it was 95%.,  Its 94 to 96% at rest on room air, I do not think he needs oxygen anymore. Continue current treatment plan.

## 2019-06-04 ENCOUNTER — Other Ambulatory Visit (HOSPITAL_COMMUNITY): Payer: Medicare Other

## 2019-06-28 ENCOUNTER — Encounter: Payer: Self-pay | Admitting: Hematology

## 2019-07-20 ENCOUNTER — Ambulatory Visit (HOSPITAL_BASED_OUTPATIENT_CLINIC_OR_DEPARTMENT_OTHER)
Admission: RE | Admit: 2019-07-20 | Discharge: 2019-07-20 | Disposition: A | Discharge status: Home / Self Care | Payer: Medicare Other | Source: Ambulatory Visit | Attending: Hematology | Admitting: Hematology

## 2019-07-20 ENCOUNTER — Inpatient Hospital Stay: Payer: Medicare Other

## 2019-07-20 ENCOUNTER — Other Ambulatory Visit: Payer: Self-pay

## 2019-07-20 ENCOUNTER — Inpatient Hospital Stay: Payer: Medicare Other | Admitting: Family

## 2019-07-20 DIAGNOSIS — R0602 Shortness of breath: Secondary | ICD-10-CM | POA: Diagnosis not present

## 2019-07-20 DIAGNOSIS — R918 Other nonspecific abnormal finding of lung field: Secondary | ICD-10-CM | POA: Insufficient documentation

## 2019-07-26 ENCOUNTER — Other Ambulatory Visit: Payer: Medicare Other

## 2019-07-26 ENCOUNTER — Ambulatory Visit: Payer: Medicare Other | Admitting: Family

## 2019-08-22 ENCOUNTER — Inpatient Hospital Stay (HOSPITAL_BASED_OUTPATIENT_CLINIC_OR_DEPARTMENT_OTHER): Payer: Medicare Other | Admitting: Family

## 2019-08-22 ENCOUNTER — Inpatient Hospital Stay: Payer: Medicare Other | Attending: Hematology & Oncology

## 2019-08-22 ENCOUNTER — Encounter: Payer: Self-pay | Admitting: Family

## 2019-08-22 ENCOUNTER — Other Ambulatory Visit: Payer: Self-pay

## 2019-08-22 VITALS — BP 135/80 | HR 64 | Temp 98.1°F | Resp 18 | Ht 71.0 in | Wt 200.8 lb

## 2019-08-22 DIAGNOSIS — I63441 Cerebral infarction due to embolism of right cerebellar artery: Secondary | ICD-10-CM

## 2019-08-22 DIAGNOSIS — C9110 Chronic lymphocytic leukemia of B-cell type not having achieved remission: Secondary | ICD-10-CM | POA: Insufficient documentation

## 2019-08-22 DIAGNOSIS — Z79899 Other long term (current) drug therapy: Secondary | ICD-10-CM | POA: Diagnosis not present

## 2019-08-22 DIAGNOSIS — D696 Thrombocytopenia, unspecified: Secondary | ICD-10-CM | POA: Insufficient documentation

## 2019-08-22 LAB — CBC WITH DIFFERENTIAL (CANCER CENTER ONLY)
Abs Immature Granulocytes: 0.03 10*3/uL (ref 0.00–0.07)
Basophils Absolute: 0.1 10*3/uL (ref 0.0–0.1)
Basophils Relative: 0 %
Eosinophils Absolute: 0.2 10*3/uL (ref 0.0–0.5)
Eosinophils Relative: 1 %
HCT: 46.1 % (ref 39.0–52.0)
Hemoglobin: 15.1 g/dL (ref 13.0–17.0)
Immature Granulocytes: 0 %
Lymphocytes Relative: 73 %
Lymphs Abs: 13.7 10*3/uL — ABNORMAL HIGH (ref 0.7–4.0)
MCH: 30.9 pg (ref 26.0–34.0)
MCHC: 32.8 g/dL (ref 30.0–36.0)
MCV: 94.3 fL (ref 80.0–100.0)
Monocytes Absolute: 1.2 10*3/uL — ABNORMAL HIGH (ref 0.1–1.0)
Monocytes Relative: 7 %
Neutro Abs: 3.6 10*3/uL (ref 1.7–7.7)
Neutrophils Relative %: 19 %
Platelet Count: 123 10*3/uL — ABNORMAL LOW (ref 150–400)
RBC: 4.89 MIL/uL (ref 4.22–5.81)
RDW: 13.6 % (ref 11.5–15.5)
WBC Count: 18.9 10*3/uL — ABNORMAL HIGH (ref 4.0–10.5)
nRBC: 0 % (ref 0.0–0.2)

## 2019-08-22 LAB — CMP (CANCER CENTER ONLY)
ALT: 23 U/L (ref 0–44)
AST: 19 U/L (ref 15–41)
Albumin: 4.2 g/dL (ref 3.5–5.0)
Alkaline Phosphatase: 58 U/L (ref 38–126)
Anion gap: 6 (ref 5–15)
BUN: 14 mg/dL (ref 8–23)
CO2: 33 mmol/L — ABNORMAL HIGH (ref 22–32)
Calcium: 9.4 mg/dL (ref 8.9–10.3)
Chloride: 105 mmol/L (ref 98–111)
Creatinine: 1.12 mg/dL (ref 0.61–1.24)
GFR, Est AFR Am: >60 mL/min (ref >60)
GFR, Estimated: >60 mL/min (ref >60)
Glucose, Bld: 74 mg/dL (ref 70–99)
Potassium: 3.9 mmol/L (ref 3.5–5.1)
Sodium: 144 mmol/L (ref 135–145)
Total Bilirubin: 0.5 mg/dL (ref 0.3–1.2)
Total Protein: 6.1 g/dL — ABNORMAL LOW (ref 6.5–8.1)

## 2019-08-22 LAB — SAVE SMEAR(SSMR), FOR PROVIDER SLIDE REVIEW

## 2019-08-22 LAB — LACTATE DEHYDROGENASE: LDH: 203 U/L — ABNORMAL HIGH (ref 98–192)

## 2019-08-22 NOTE — Progress Notes (Addendum)
Hematology and Oncology Follow Up Visit  Jason Abbott [id number] [birth year] 74 y.o. 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 Monoclonal b-cell lymphocytosis on PB flow cytometry, c/w with CLL  ? No lymphadenopathy or hepatosplenomegaly on CT    Thrombocytopenia -Plts 120-150k's since 2017  Current Therapy:   Observation    Interim History:  Jason Abbott is here today for follow-up. He is doing well and just returned from a wonderful road trip with his wife out to the [facility] and back.  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  His WBC count is stable at 18.9, Hgb 15.1, platelets 123.  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  He has been off of supplemental O2 for a month now.  No issues with frequent infections. No fever, chills, n/v, cough, rash, dizziness, SOB, chest pain, palpitations, abdominal pain or changes in bowel or bladder habits.  No c/o fatigue or weakness.  He does bruise easily on Plavix and aspirin. No blood loss noted. No petechiae.  No swelling, tenderness, numbness or tingling in her extremities.  No falls or syncope.  He has maintained a good appetite and is staying well hydrated. His weight is stable.   ECOG Performance Status: 0 - Asymptomatic  Medications:  Allergies as of 08/22/2019   No Known Allergies     Medication List       Accurate as of August 22, 2019 10:42 AM. If you have any questions, ask your nurse or doctor.        STOP taking these medications   albuterol 108 (90 Base) MCG/ACT inhaler Commonly known as: VENTOLIN HFA Stopped by: Jason Peace, NP   clopidogrel 75 MG tablet Commonly known as: PLAVIX Stopped by: Jason Peace, NP   Humidifier Misc Stopped by: Jason Peace, NP   triamcinolone ointment 0.5 % Commonly known as: KENALOG Stopped by: Jason Peace, NP     TAKE these medications   aspirin EC 81 MG tablet Take 1 tablet (81 mg total) by mouth daily.   atorvastatin 40 MG tablet Commonly known as: LIPITOR Take 1 tablet (40 mg total) by mouth daily at 6 PM.   Breztri Aerosphere 160-9-4.8 MCG/ACT Aero Generic drug: Budeson-Glycopyrrol-Formoterol Inhale 2 puffs into the lungs in the morning and at bedtime.   MAGNESIUM-OXIDE PO Take 400 mg by mouth daily.   multivitamin tablet Take 1 tablet by mouth daily.   predniSONE 5 MG Tbec Take 1 tablet by mouth daily.       Allergies: No Known Allergies  Past Medical History, Surgical history, Social history, and Family History were reviewed and updated.  Review of Systems: All other 10 point review of systems is negative.   Physical Exam:  height is 5\' 11"  (1.803 m) and weight is 200 lb 12.8 oz (91.1 kg). His oral temperature is 98.1 F (36.7 C). His blood pressure is 135/80 and his pulse is 64. His respiration is 18 and oxygen saturation is 100%.   Wt Readings from Last 3 Encounters:  08/22/19 200 lb 12.8 oz (91.1 kg)  06/01/19 191 lb (86.6 kg)  05/04/19 190 lb (86.2 kg)    Ocular: Sclerae unicteric, pupils equal, round and reactive to light Ear-nose-throat: Oropharynx clear, dentition fair Lymphatic: No cervical or supraclavicular adenopathy Lungs no rales or rhonchi, good excursion bilaterally Heart regular rate and  rhythm, no murmur appreciated Abd soft, nontender, positive bowel sounds, no liver or spleen tip palpated on exam, no fluid wave  MSK no focal spinal tenderness, no joint edema Neuro: non-focal, well-oriented, appropriate affect Breasts: Deferred   Lab Results  Component Value Date   WBC 18.9 (H) 08/22/2019   HGB 15.1 08/22/2019   HCT 46.1 08/22/2019   MCV 94.3 08/22/2019   PLT 123 (L) 08/22/2019   No results found for: FERRITIN, IRON, TIBC, UIBC, IRONPCTSAT Lab Results  Component Value Date   RBC 4.89 08/22/2019   No results found  for: KPAFRELGTCHN, LAMBDASER, KAPLAMBRATIO No results found for: IGGSERUM, IGA, IGMSERUM No results found for: Ronnald Ramp, A1GS, A2GS, Tillman Sers, SPEI   Chemistry      Component Value Date/Time   NA 139 04/19/2019 1356   K 3.9 04/19/2019 1356   CL 102 04/19/2019 1356   CO2 29 04/19/2019 1356   BUN 9 04/19/2019 1356   CREATININE 1.09 04/19/2019 1356   CREATININE 0.89 03/30/2019 1603      Component Value Date/Time   CALCIUM 9.0 04/19/2019 1356   ALKPHOS 67 04/19/2019 1356   AST 22 04/19/2019 1356   ALT 22 04/19/2019 1356   BILITOT 0.5 04/19/2019 1356       Impression and Plan: Jason Abbott is a very pleasant 74 yo caucasian gentleman with stage 0 CLL and mild thrombocytopenia. He is asymptomatic and has no complaints at this time.  Counts remain stable. No intervention needed at this time.  We will plan to see him again in 4 months.  He can contact our office with any questions or concerns. We can certainly see him sooner if needed.   Jason Peace, NP 8/16/202110:42 AM

## 2019-12-22 ENCOUNTER — Inpatient Hospital Stay: Payer: Medicare Other | Admitting: Hematology & Oncology

## 2019-12-22 ENCOUNTER — Inpatient Hospital Stay: Payer: Medicare Other | Attending: Hematology & Oncology

## 2020-02-28 ENCOUNTER — Ambulatory Visit (INDEPENDENT_AMBULATORY_CARE_PROVIDER_SITE_OTHER): Payer: Medicare Other | Admitting: Sports Medicine

## 2020-02-28 ENCOUNTER — Encounter: Payer: Self-pay | Admitting: Sports Medicine

## 2020-02-28 DIAGNOSIS — K279 Peptic ulcer, site unspecified, unspecified as acute or chronic, without hemorrhage or perforation: Secondary | ICD-10-CM

## 2020-02-28 MED ORDER — AMOXICILL-CLARITHRO-LANSOPRAZ PO MISC: Freq: Two times a day (BID) | ORAL | 0 refills | Status: DC

## 2020-02-28 NOTE — Progress Notes (Signed)
    Procedures performed today:    None.  Independent interpretation of notes and tests performed by another provider:   None.  Brief History, Exam, Impression, and Recommendations:    Peptic ulcer disease This is a pleasant 76 year old male, approximately 5 years ago he had some abdominal pain, ultimately an H. pylori breath test was positive, this resolved very quickly with a Prevpac. He is having similar symptoms now, nothing surgical and his abdominal exam. Significant early satiety. I am going to go ahead and just call in [place] again rather than do another H. pylori breath test. If insufficient improvement after 6 weeks we will refer to gastroenterology.    ___________________________________________ Gwen Her. Dianah Field, M.D., ABFM., CAQSM. Primary Care and [place] Instructor of [place] of [facility] of Medicine

## 2020-02-28 NOTE — Assessment & Plan Note (Signed)
This is a pleasant 75 year old male, approximately 5 years ago he had some abdominal pain, ultimately an H. pylori breath test was positive, this resolved very quickly with a Prevpac. He is having similar symptoms now, nothing surgical and his abdominal exam. Significant early satiety. I am going to go ahead and just call in [place] again rather than do another H. pylori breath test. If insufficient improvement after 6 weeks we will refer to gastroenterology.

## 2020-04-10 ENCOUNTER — Ambulatory Visit (INDEPENDENT_AMBULATORY_CARE_PROVIDER_SITE_OTHER): Payer: Medicare Other | Admitting: Sports Medicine

## 2020-04-10 ENCOUNTER — Other Ambulatory Visit: Payer: Self-pay

## 2020-04-10 ENCOUNTER — Encounter: Payer: Self-pay | Admitting: Sports Medicine

## 2020-04-10 DIAGNOSIS — M545 Low back pain, unspecified: Secondary | ICD-10-CM | POA: Diagnosis not present

## 2020-04-10 DIAGNOSIS — K279 Peptic ulcer, site unspecified, unspecified as acute or chronic, without hemorrhage or perforation: Secondary | ICD-10-CM

## 2020-04-10 MED ORDER — PANTOPRAZOLE SODIUM 40 MG PO TBEC: 40.0000 mg | DELAYED_RELEASE_TABLET | Freq: Every day | ORAL | 3 refills | Status: DC

## 2020-04-10 NOTE — Assessment & Plan Note (Signed)
5 years ago he had a positive H. pylori test, this resolved quickly with Prevpac, we did this again last month, symptoms resolved quickly but he started to have recurrence of discomfort, his wife is starting to have similar discomfort as well. No clinical signs or symptoms of a GI bleed. I am going to hold off on an additional course of Prevpac for [place] and we will do pantoprazole 40 mg daily. I am going to treat his wife aggressively with Prevpac and acid blockers. We will do the above for 6 weeks at which point he can come back to see me.

## 2020-04-10 NOTE — Progress Notes (Signed)
    Procedures performed today:    None.  Independent interpretation of notes and tests performed by another provider:   None.  Brief History, Exam, Impression, and Recommendations:    Low back pain Moderate low back pain, better with flexion, worse with walking, he has had this now for years. Pain is mostly axial and right-sided. We will hold off on medication per his request and adding x-rays, formal physical therapy, return to see me in 6 weeks, MRI for interventional planning if no better, likely has lumbar spinal stenosis.  Peptic ulcer disease 5 years ago he had a positive H. pylori test, this resolved quickly with Prevpac, we did this again last month, symptoms resolved quickly but he started to have recurrence of discomfort, his wife is starting to have similar discomfort as well. No clinical signs or symptoms of a GI bleed. I am going to hold off on an additional course of Prevpac for [place] and we will do pantoprazole 40 mg daily. I am going to treat his wife aggressively with Prevpac and acid blockers. We will do the above for 6 weeks at which point he can come back to see me.    ___________________________________________ Gwen Her. Dianah Field, M.D., ABFM., CAQSM. Primary Care and [place] Instructor of [place] of [facility] of Medicine

## 2020-04-10 NOTE — Assessment & Plan Note (Signed)
Moderate low back pain, better with flexion, worse with walking, he has had this now for years. Pain is mostly axial and right-sided. We will hold off on medication per his request and adding x-rays, formal physical therapy, return to see me in 6 weeks, MRI for interventional planning if no better, likely has lumbar spinal stenosis.

## 2020-04-11 ENCOUNTER — Ambulatory Visit (INDEPENDENT_AMBULATORY_CARE_PROVIDER_SITE_OTHER): Payer: Medicare Other

## 2020-04-11 DIAGNOSIS — M545 Low back pain, unspecified: Secondary | ICD-10-CM

## 2020-04-13 ENCOUNTER — Encounter: Payer: Self-pay | Admitting: Rehabilitative and Restorative Service Providers"

## 2020-04-13 ENCOUNTER — Ambulatory Visit (INDEPENDENT_AMBULATORY_CARE_PROVIDER_SITE_OTHER): Payer: Medicare Other | Admitting: Rehabilitative and Restorative Service Providers"

## 2020-04-13 ENCOUNTER — Other Ambulatory Visit: Payer: Self-pay

## 2020-04-13 DIAGNOSIS — M5440 Lumbago with sciatica, unspecified side: Secondary | ICD-10-CM

## 2020-04-13 DIAGNOSIS — R29898 Other symptoms and signs involving the musculoskeletal system: Secondary | ICD-10-CM | POA: Diagnosis not present

## 2020-04-13 NOTE — Addendum Note (Signed)
Addended by: Rudell Cobb M on: 04/13/2020 11:40 AM   Modules accepted: Orders

## 2020-04-13 NOTE — Therapy (Signed)
[facility] [address] [place], [place], [address] Phone: [phone]   Fax:  [phone]  Physical Therapy Evaluation  Patient Details  Name: Jason Abbott. MRN: [id number] Date of Birth: 09-15-1945 Referring Provider (PT): Aundria Mems, MD   Encounter Date: 04/13/2020   PT End of Session - 04/13/20 1030    Visit Number 1    Number of Visits 6    Date for PT Re-Evaluation 05/25/20    Authorization Type medicare and tricare    PT Start Time 4097    PT Stop Time 1103    PT Time Calculation (min) 45 min    Activity Tolerance Patient tolerated treatment well    Behavior During Therapy [facility] for tasks assessed/performed           Past Medical History:  Diagnosis Date  . Bowel obstruction ([place])   . 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 H/O urinary infection   . Stroke [facility]) 01/2019    Past Surgical History:  Procedure Laterality Date  . HERNIA REPAIR    . MECKEL DIVERTICULUM EXCISION    . ORIF FINGER / THUMB FRACTURE      There were no vitals filed for this visit.    Subjective Assessment - 04/13/20 1022    Subjective The patient reports he cannot tolerate keeping his mask on during therapy.  Therefore, PT provided treatment in individual room.   The patient reports he has had low back pain since 2014.  It started bothering him 20-30 years ago.  Pain has increased in frequency and intensity with some radiating into the left hip.    Diagnostic tests 1. Moderate scoliosis with multilevel degenerative disc disease.  2. Grade 1 anterolisthesis of L5 on S1 with associated pars defects.  3. Degenerative retrolisthesis of L2 on L3.  4. Mild facet hypertrophy at L4-L5.    Patient Stated Goals Reduce back pain, learn exercises    Currently in Pain? Yes    Pain Score --   not able to rate   Pain Location Back    Pain Orientation Left;Lower    Pain Descriptors / Indicators  Radiating;Aching;Discomfort    Pain Type Chronic pain    Pain Radiating Towards L hip and leg    Pain Onset More than a month ago    Pain Frequency Intermittent    Aggravating Factors  morning takes time to reduce pain and get moving, walking leads to radiating symptoms into the left leg    Pain Relieving Factors gentle movement              [facility] PT Assessment - 04/13/20 1045      Assessment   Medical Diagnosis acute bilateral low back pain without sciatica    Referring Provider (PT) Aundria Mems, MD    Onset Date/Surgical Date 04/10/20    Hand Dominance Right      Precautions   Precautions None      Restrictions   Weight Bearing Restrictions No      Balance Screen   Has the patient fallen in the past 6 months No    Has the patient had a decrease in activity level because of a fear of falling?  No    Is the patient reluctant to leave their home because of a fear of falling?  No      Home Ecologist residence  Observation/Other Assessments   Focus on Therapeutic Outcomes (FOTO)  71%      Sensation   Light Touch Appears Intact      ROM / Strength   AROM / PROM / Strength AROM;Strength      AROM   Overall AROM  Deficits    AROM Assessment Site Lumbar    Lumbar Flexion 25% limitation    Lumbar Extension 25% limitation    Lumbar - Right Side Bend 25% limitation    Lumbar - Left Side Bend 25% limitation    Lumbar - Right Rotation 25% limitation    Lumbar - Left Rotation 25% limitation      Strength   Overall Strength Within functional limits for tasks performed    Strength Assessment Site Hip;Knee;Ankle    Right/Left Hip Right;Left    Right Hip Flexion 5/5    Right Hip ABduction 5/5    Left Hip Flexion 5/5    Left Hip ABduction 5/5    Right/Left Knee Right;Left    Right Knee Flexion 5/5    Right Knee Extension 5/5    Left Knee Flexion 5/5    Left Knee Extension 5/5    Right/Left Ankle Right;Left    Right Ankle  Plantar Flexion 5/5    Left Ankle Dorsiflexion 5/5    Left Ankle Plantar Flexion 5/5      Flexibility   Soft Tissue Assessment /Muscle Length yes    Hamstrings full extension at knees with hips flexed to 90    Quadriceps WNls and equal bilat      Palpation   Spinal mobility spring test -- note tightness CPA upper Lumbar spine and thoracolumbar junction      Special Tests    Special Tests Hip Special Tests    Hip Special Tests  Saralyn Pilar [facility]) Test      Saralyn Pilar [facility]) Test   Findings Positive    Side Left                      Objective measurements completed on examination: See above findings.       [facility] Adult PT Treatment/Exercise - 04/13/20 1058      Exercises   Exercises Knee/Hip;Lumbar      Lumbar Exercises: Stretches   Double Knee to Chest Stretch Limitations patient verbally discussed double knee to chest-- added to HEP    Quad Stretch Right;Left;1 rep;30 seconds    Piriformis Stretch Right;Left;1 rep;30 seconds    Piriformis Stretch Limitations difficult in sitting, able to do in supine      Lumbar Exercises: Standing   Wall Slides 10 reps    Other Standing Lumbar Exercises sit<>stand squats x 5 reps with knee discomfort-- moved to quarter wall slide      Lumbar Exercises: Quadruped   Opposite Arm/Leg Raise Right arm/Left leg;Left arm/Right leg;5 reps                  PT Education - 04/13/20 1111    Education Details HEP    Person(s) Educated Patient    Methods Explanation;Demonstration;Handout    Comprehension Verbalized understanding;Returned demonstration               PT Long Term Goals - 04/13/20 1031      PT LONG TERM GOAL #1   Title The patient will be indep with HEP.    Time 6    Period Weeks    Target Date 05/25/20      PT  LONG TERM GOAL #2   Title The patient will imrpove functional status score from 71% up to 73%.    Time 6    Period Weeks    Target Date 05/25/20      PT LONG TERM GOAL #3   Title The  patient will report less incidence of low back pain in the morning.    Time 6    Period Weeks    Target Date 05/25/20      PT LONG TERM GOAL #4   Title The patient will tolerate walking for 15 minutes without increase in back pain.    Time 6    Period Weeks    Target Date 05/25/20      PT LONG TERM GOAL #5   Title Further hip ER ROM measurements to follow and address as indicated.    Time 6    Period Weeks    Target Date 05/25/20                  Plan - 04/13/20 1133    Clinical Impression Statement The patient is a 75 yo male with h/o chronic low back pain that is worsening in the morning and with extended walking.  He presents with WNLs strength, mild decrease AROM lumbar spine, decreased hip ER, and pain limiting extended ambulation.  PT to address deficits to return to prior functional status.    Examination-Activity Limitations Locomotion Level;Stand;Squat    Examination-Participation Restrictions Community Activity    Stability/Clinical Decision Making Stable/Uncomplicated    Clinical Decision Making Low    Rehab Potential Good    PT Frequency 1x / week    PT Duration 6 weeks    PT Treatment/Interventions ADLs/Self Care Home Management;Patient/family education;Manual techniques;Taping;Dry needling;Therapeutic activities;Therapeutic exercise;Moist Heat;Electrical Stimulation    PT Next Visit Plan measure hip ER and provide self distraction of hip to determine if this impacts morning pain; core stability, home program progression.    PT Home Exercise Plan Access Code: [email or web address]    Consulted and Agree with Plan of Care Patient           Patient will benefit from skilled therapeutic intervention in order to improve the following deficits and impairments:  Pain,Hypomobility,Decreased range of motion,Decreased activity tolerance  Visit Diagnosis: Acute bilateral low back pain with sciatica, sciatica laterality unspecified  Other symptoms and signs involving the  musculoskeletal system     Problem List Patient Active Problem List   Diagnosis Date Noted  . Pneumonia due to COVID-19 virus 03/16/2019  . Feeling sick 03/14/2019  . CLL (chronic lymphocytic leukemia) ([place]) 02/23/2019  . 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/27/2019  . Leukocytosis 01/23/2019  . Low back pain 12/21/2018  . Left hand pain 10/20/2018  . Hyperlipidemia, mixed 06/01/2018  . Seborrheic keratosis 01/12/2018  . 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  . Cubital tunnel syndrome 03/09/2017  . Annual physical exam 04/26/2015  . Prostatitis with Urosepsis 05/11/2014  . Bacterial infection due to Klebsiella pneumoniae 04/20/2014  . Pyelonephritis 04/17/2014  . Combined forms of age-related cataract of left eye 03/23/2014  . Regular astigmatism of left eye 03/23/2014  . Peptic ulcer disease 02/23/2014  . Shingles outbreak 10/03/2013  . Right shoulder pain 10/03/2013    Fumiko Cham, PT 04/13/2020, 11:38 AM  [facility] [address] [place], [place], [address] Phone: [phone]   Fax:  [phone]  Name: Quamir Willemsen. MRN:  [id number] Date of Birth: 08/07/1945

## 2020-04-13 NOTE — Patient Instructions (Signed)
Access Code: [email or web address] URL: [email or web address] Date: 04/13/2020 Prepared by: Rudell Cobb  Exercises Wall Quarter Squat - 2 x daily - 7 x weekly - 1 sets - 10 reps Supine Piriformis Stretch with Foot on Ground - 2 x daily - 7 x weekly - 1 sets - 2 reps - 30 seconds hold Supine Double Knee to Chest - 2 x daily - 7 x weekly - 1 sets - 2 reps - 30 seconds hold Bird Dog - 2 x daily - 7 x weekly - 1 sets - 10 reps

## 2020-04-30 ENCOUNTER — Ambulatory Visit (INDEPENDENT_AMBULATORY_CARE_PROVIDER_SITE_OTHER): Payer: Medicare Other | Admitting: Rehabilitative and Restorative Service Providers"

## 2020-04-30 ENCOUNTER — Other Ambulatory Visit: Payer: Self-pay

## 2020-04-30 DIAGNOSIS — M5440 Lumbago with sciatica, unspecified side: Secondary | ICD-10-CM | POA: Diagnosis not present

## 2020-04-30 DIAGNOSIS — R29898 Other symptoms and signs involving the musculoskeletal system: Secondary | ICD-10-CM | POA: Diagnosis not present

## 2020-04-30 NOTE — Therapy (Addendum)
[place], [place], [address] Phone: [phone]   Fax:  [phone]  Physical Therapy Treatment and Discharge Summary  Patient Details  Name: Jason Abbott. MRN: [id number] Date of Birth: 1945-07-15 Referring Provider (PT): Aundria Mems, MD  PHYSICAL THERAPY DISCHARGE SUMMARY  Visits from Start of Care: 2  Current functional level related to goals / functional outcomes: See goals below--not reassessed as patient seen for eval + 1 treatment   Remaining deficits: See eval for impairments   Education / Equipment: HEP   Patient agrees to discharge. Patient goals were not met. Patient is being discharged due to not returning to PT.  Encounter Date: 04/30/2020   PT End of Session - 04/30/20 1157     Visit Number 2    Number of Visits 6    Date for PT Re-Evaluation 05/25/20    Authorization Type medicare and tricare    PT Start Time 6415    PT Stop Time 8309    PT Time Calculation (min) 40 min    Activity Tolerance Patient tolerated treatment well    Behavior During Therapy WFL for tasks assessed/performed             Past Medical History:  Diagnosis Date   Bowel obstruction ([facility])    Diverticul disease small and large intestine, no perforati or abscess    H/O urinary infection    Stroke ([place]) 01/2019    Past Surgical History:  Procedure Laterality Date   HERNIA REPAIR     MECKEL DIVERTICULUM EXCISION     ORIF FINGER / THUMB FRACTURE      There were no vitals filed for this visit.   Subjective Assessment - 04/30/20 1155     Subjective The patient reports when he is waking up it is the worst time of the day.  The patient reports that the pain is no longer radiating into the L hip and leg.    Diagnostic tests 1. Moderate scoliosis with multilevel degenerative disc disease.  2. Grade 1 anterolisthesis of L5 on S1 with associated pars defects.  3. Degenerative  retrolisthesis of L2 on L3.  4. Mild facet hypertrophy at L4-L5.    Patient Stated Goals Reduce back pain, learn exercises    Currently in Pain? Yes    Pain Score --   always hurts just a little bit   Pain Location Back    Pain Orientation Left;Lower    Pain Descriptors / Indicators Aching;Radiating;Discomfort    Pain Onset More than a month ago    Pain Frequency Intermittent    Aggravating Factors  morning    Pain Relieving Factors gentle movement                [facility] PT Assessment - 04/30/20 1203       Assessment   Medical Diagnosis acute bilateral low back pain without sciatica    Referring Provider (PT) Aundria Mems, MD    Onset Date/Surgical Date 04/10/20    Hand Dominance Right                           [facility] Adult PT Treatment/Exercise - 04/30/20 1203       Exercises   Exercises Knee/Hip;Lumbar      Lumbar Exercises: Stretches   Active Hamstring Stretch Right;Left;1 rep;30 seconds    Figure 4 Stretch 2 reps;30 seconds    Other Lumbar Stretch Exercise  hip adductor stretch supine      Lumbar Exercises: Standing   Wall Slides 10 reps      Lumbar Exercises: Supine   Bridge 10 reps    Other Supine Lumbar Exercises self traction hooklying      Knee/Hip Exercises: Standing   Wall Squat 10 reps    SLS right and left sides x 20 seconds    SLS with Vectors the diver reaching towards a chair                    PT Education - 04/30/20 1315     Education Details HEP progression    Person(s) Educated Patient    Methods Explanation;Demonstration;Handout    Comprehension Verbalized understanding;Returned demonstration                 PT Long Term Goals - 04/13/20 1031       PT LONG TERM GOAL #1   Title The patient will be indep with HEP.    Time 6    Period Weeks    Target Date 05/25/20      PT LONG TERM GOAL #2   Title The patient will imrpove functional status score from 71% up to 73%.    Time 6    Period  Weeks    Target Date 05/25/20      PT LONG TERM GOAL #3   Title The patient will report less incidence of low back pain in the morning.    Time 6    Period Weeks    Target Date 05/25/20      PT LONG TERM GOAL #4   Title The patient will tolerate walking for 15 minutes without increase in back pain.    Time 6    Period Weeks    Target Date 05/25/20      PT LONG TERM GOAL #5   Title Further hip ER ROM measurements to follow and address as indicated.    Time 6    Period Weeks    Target Date 05/25/20                   Plan - 04/30/20 1319     Clinical Impression Statement The patient is having less radiating symptoms, but does still continue with morning pain in low back that improves within 30 minutes of waking.  PT added further HEP and continues to progress strengthening and flexibility to tolerance.    PT Treatment/Interventions ADLs/Self Care Home Management;Patient/family education;Manual techniques;Taping;Dry needling;Therapeutic activities;Therapeutic exercise;Moist Heat;Electrical Stimulation    PT Next Visit Plan progress HEP, hip ROM L hip into ER, standing strengthening    PT Home Exercise Plan Access Code: [email or web address]    Consulted and Agree with Plan of Care Patient             Patient will benefit from skilled therapeutic intervention in order to improve the following deficits and impairments:     Visit Diagnosis: Acute bilateral low back pain with sciatica, sciatica laterality unspecified  Other symptoms and signs involving the musculoskeletal system     Problem List Patient Active Problem List   Diagnosis Date Noted   Pneumonia due to COVID-19 virus 03/16/2019   Feeling sick 03/14/2019   CLL (chronic lymphocytic leukemia) ([place]) 02/23/2019   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/27/2019   Leukocytosis 01/23/2019   Low back pain 12/21/2018   Left hand pain 10/20/2018   Hyperlipidemia, mixed  06/01/2018   Seborrheic keratosis 01/12/2018   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   Cubital tunnel syndrome 03/09/2017   Annual physical exam 04/26/2015   Prostatitis with Urosepsis 05/11/2014   Bacterial infection due to Klebsiella pneumoniae 04/20/2014   Pyelonephritis 04/17/2014   Combined forms of age-related cataract of left eye 03/23/2014   Regular astigmatism of left eye 03/23/2014   Peptic ulcer disease 02/23/2014   Shingles outbreak 10/03/2013   Right shoulder pain 10/03/2013    Stormee Duda, PT 04/30/2020, 1:21 PM  [facility] [place], [place], [address] Phone: [phone]   Fax:  [phone]  Name: Jahseh Lucchese. MRN: [id number] Date of Birth: [birth year]

## 2020-04-30 NOTE — Patient Instructions (Signed)
Access Code: [email or web address] URL: [email or web address] Date: 04/30/2020 Prepared by: Rudell Cobb  Exercises Supine Piriformis Stretch with Foot on Ground - 2 x daily - 7 x weekly - 1 sets - 2 reps - 30 seconds hold Supine Double Knee to Chest - 2 x daily - 7 x weekly - 1 sets - 2 reps - 30 seconds hold Supine Hip Adductor Stretch - 2 x daily - 7 x weekly - 1 sets - 3 reps - 30 seconds hold Supine Lower Trunk Rotation - 2 x daily - 7 x weekly - 1 sets - 3 reps - 30 seconds hold Hooklying Lumbar Traction - 2 x daily - 7 x weekly - 1 sets - 10 reps - 5 seconds hold Bird Dog - 1 x daily - 7 x weekly - 1 sets - 10 reps - 3 seconds hold Seated Hamstring Stretch with Chair - 2 x daily - 7 x weekly - 1 sets - 10 reps Wall Quarter Squat - 1 x daily - 7 x weekly - 1 sets - 10 reps - 3-5 seconds hold The Diver - 1 x daily - 7 x weekly - 1 sets - 5 reps

## 2020-05-22 ENCOUNTER — Other Ambulatory Visit: Payer: Self-pay

## 2020-05-22 ENCOUNTER — Ambulatory Visit (INDEPENDENT_AMBULATORY_CARE_PROVIDER_SITE_OTHER): Payer: Medicare Other | Admitting: Sports Medicine

## 2020-05-22 ENCOUNTER — Encounter: Payer: Self-pay | Admitting: Sports Medicine

## 2020-05-22 DIAGNOSIS — R1013 Epigastric pain: Secondary | ICD-10-CM

## 2020-05-22 DIAGNOSIS — M545 Low back pain, unspecified: Secondary | ICD-10-CM

## 2020-05-22 DIAGNOSIS — M4807 Spinal stenosis, lumbosacral region: Secondary | ICD-10-CM

## 2020-05-22 DIAGNOSIS — M48061 Spinal stenosis, lumbar region without neurogenic claudication: Secondary | ICD-10-CM | POA: Diagnosis not present

## 2020-05-22 NOTE — Progress Notes (Addendum)
    Procedures performed today:    None.  Independent interpretation of notes and tests performed by another provider:   None.  Brief History, Exam, Impression, and Recommendations:    Dyspepsia Milo returns, he is a pleasant 75 year old male with a history of H. pylori induced gastritis that resolved with Prevpac. A couple of months ago he had a recurrence of similar symptoms, no stigmata of GI bleeding, he did have some early satiety, so we treated him with Prevpac empirically. He improved, and then had a recurrence of symptoms, his wife also started to have symptoms. We had him continue pantoprazole which seems to work when he takes it intermittently. As he continues to have early satiety, and midepigastric pain I think it is prudent to go ahead and get an upper endoscopy, referral to Dr. Bryan Lemma.  Lumbar spinal stenosis Continued moderate axial low back pain better with flexion and worse with walking for years, mostly axial and right-sided, he has done physical therapy starting 04/10/2020. Continues to have discomfort so we will proceed with MRI for interventional planning.    ___________________________________________ Gwen Her. Dianah Field, M.D., ABFM., CAQSM. Primary Care and [place] Instructor of [place] of [facility]

## 2020-05-22 NOTE — Assessment & Plan Note (Signed)
Jason Abbott returns, he is a pleasant 75 year old male with a history of H. pylori induced gastritis that resolved with Prevpac. A couple of months ago he had a recurrence of similar symptoms, no stigmata of GI bleeding, he did have some early satiety, so we treated him with Prevpac empirically. He improved, and then had a recurrence of symptoms, his wife also started to have symptoms. We had him continue pantoprazole which seems to work when he takes it intermittently. As he continues to have early satiety, and midepigastric pain I think it is prudent to go ahead and get an upper endoscopy, referral to Dr. Bryan Lemma.

## 2020-05-22 NOTE — Assessment & Plan Note (Signed)
Continued moderate axial low back pain better with flexion and worse with walking for years, mostly axial and right-sided, he has done physical therapy starting 04/10/2020. Continues to have discomfort so we will proceed with MRI for interventional planning.

## 2020-05-26 ENCOUNTER — Other Ambulatory Visit: Payer: Self-pay

## 2020-05-26 ENCOUNTER — Ambulatory Visit (INDEPENDENT_AMBULATORY_CARE_PROVIDER_SITE_OTHER): Payer: Medicare Other

## 2020-05-26 DIAGNOSIS — M4186 Other forms of scoliosis, lumbar region: Secondary | ICD-10-CM | POA: Diagnosis not present

## 2020-05-26 DIAGNOSIS — M4319 Spondylolisthesis, multiple sites in spine: Secondary | ICD-10-CM | POA: Diagnosis not present

## 2020-05-26 DIAGNOSIS — M5126 Other intervertebral disc displacement, lumbar region: Secondary | ICD-10-CM | POA: Diagnosis not present

## 2020-05-26 DIAGNOSIS — M4807 Spinal stenosis, lumbosacral region: Secondary | ICD-10-CM

## 2020-05-26 DIAGNOSIS — M48061 Spinal stenosis, lumbar region without neurogenic claudication: Secondary | ICD-10-CM | POA: Diagnosis not present

## 2020-05-26 DIAGNOSIS — M545 Low back pain, unspecified: Secondary | ICD-10-CM

## 2020-06-01 NOTE — Addendum Note (Signed)
Addended by: Silverio Decamp on: 06/01/2020 10:15 AM   Modules accepted: Orders

## 2020-06-08 ENCOUNTER — Other Ambulatory Visit: Payer: Self-pay

## 2020-06-08 ENCOUNTER — Ambulatory Visit
Admission: RE | Admit: 2020-06-08 | Discharge: 2020-06-08 | Disposition: A | Discharge status: Home / Self Care | Payer: Medicare Other | Source: Ambulatory Visit | Attending: Sports Medicine | Admitting: Sports Medicine

## 2020-06-08 DIAGNOSIS — M545 Low back pain, unspecified: Secondary | ICD-10-CM

## 2020-06-08 DIAGNOSIS — M47817 Spondylosis without myelopathy or radiculopathy, lumbosacral region: Secondary | ICD-10-CM | POA: Diagnosis not present

## 2020-06-08 DIAGNOSIS — M48061 Spinal stenosis, lumbar region without neurogenic claudication: Secondary | ICD-10-CM

## 2020-06-08 MED ORDER — METHYLPREDNISOLONE ACETATE 40 MG/ML INJ SUSP (RADIOLOG
80.0000 mg | Freq: Once | INTRAMUSCULAR | Status: AC
  Administered 2020-06-08: 80 mg via EPIDURAL

## 2020-06-08 MED ORDER — IOPAMIDOL (ISOVUE-M 200) INJECTION 41%
1.0000 mL | Freq: Once | INTRAMUSCULAR | Status: AC
  Administered 2020-06-08: 1 mL via EPIDURAL

## 2020-06-08 NOTE — Discharge Instructions (Signed)
Post Procedure Spinal Discharge Instruction Sheet  1. You may resume a regular diet and any medications that you routinely take (including pain medications).  2. No driving day of procedure.  3. Light activity throughout the rest of the day.  Do not do any strenuous work, exercise, bending or lifting.  The day following the procedure, you can resume normal physical activity but you should refrain from exercising or physical therapy for at least three days thereafter.   Common Side Effects:   Headaches- take your usual medications as directed by your physician.  Increase your fluid intake.  Caffeinated beverages may be helpful.  Lie flat in bed until your headache resolves.   Restlessness or inability to sleep- you may have trouble sleeping for the next few days.  Ask your referring physician if you need any medication for sleep.   Facial flushing or redness- should subside within a few days.   Increased pain- a temporary increase in pain a day or two following your procedure is not unusual.  Take your pain medication as prescribed by your referring physician.   Leg cramps  Please contact our office at [phone] for the following symptoms:  Fever greater than 100 degrees.  Headaches unresolved with medication after 2-3 days.  Increased swelling, pain, or redness at injection site.   Thank you for visiting [facility] today.  

## 2020-06-14 DIAGNOSIS — N390 Urinary tract infection, site not specified: Secondary | ICD-10-CM | POA: Diagnosis not present

## 2020-06-14 DIAGNOSIS — R300 Dysuria: Secondary | ICD-10-CM | POA: Diagnosis not present

## 2020-08-15 ENCOUNTER — Other Ambulatory Visit: Payer: Self-pay

## 2020-08-15 ENCOUNTER — Ambulatory Visit (INDEPENDENT_AMBULATORY_CARE_PROVIDER_SITE_OTHER): Payer: Medicare Other | Admitting: Sports Medicine

## 2020-08-15 DIAGNOSIS — L603 Nail dystrophy: Secondary | ICD-10-CM

## 2020-08-15 MED ORDER — TERBINAFINE HCL 250 MG PO TABS: 250.0000 mg | ORAL_TABLET | Freq: Every day | ORAL | 1 refills | Status: AC

## 2020-08-15 NOTE — Progress Notes (Signed)
    Procedures performed today:    None.  Independent interpretation of notes and tests performed by another provider:   None.  Brief History, Exam, Impression, and Recommendations:    Onychodystrophy Jason Abbott has a long history of thickened yellowish toenails, obvious onychodystrophy, potentially onychomycosis for several years. We did discuss the difficulty in treating toenails at this juncture, I will go ahead and add Lamisil, he understands we will need to check his liver function periodically along the course and that it could take 6 to 9 months of treatment, starting treatment, we will check LFTs today, and again in a month. Return to see me in 3 months.    ___________________________________________ Gwen Her. Dianah Field, M.D., ABFM., CAQSM. Primary Care and [place] Instructor of [place] of [facility]

## 2020-08-15 NOTE — Assessment & Plan Note (Signed)
Jason Abbott has a long history of thickened yellowish toenails, obvious onychodystrophy, potentially onychomycosis for several years. We did discuss the difficulty in treating toenails at this juncture, I will go ahead and add Lamisil, he understands we will need to check his liver function periodically along the course and that it could take 6 to 9 months of treatment, starting treatment, we will check LFTs today, and again in a month. Return to see me in 3 months.

## 2020-08-16 LAB — COMPREHENSIVE METABOLIC PANEL
AG Ratio: 2 (calc) (ref 1.0–2.5)
ALT: 12 U/L (ref 9–46)
AST: 13 U/L (ref 10–35)
Albumin: 4.5 g/dL (ref 3.6–5.1)
Alkaline phosphatase (APISO): 73 U/L (ref 35–144)
BUN: 12 mg/dL (ref 7–25)
CO2: 33 mmol/L — ABNORMAL HIGH (ref 20–32)
Calcium: 9.3 mg/dL (ref 8.6–10.3)
Chloride: 104 mmol/L (ref 98–110)
Creat: 1.05 mg/dL (ref 0.70–1.28)
Globulin: 2.3 g/dL (calc) (ref 1.9–3.7)
Glucose, Bld: 99 mg/dL (ref 65–99)
Potassium: 4.4 mmol/L (ref 3.5–5.3)
Sodium: 141 mmol/L (ref 135–146)
Total Bilirubin: 0.5 mg/dL (ref 0.2–1.2)
Total Protein: 6.8 g/dL (ref 6.1–8.1)

## 2020-09-01 ENCOUNTER — Encounter (INDEPENDENT_AMBULATORY_CARE_PROVIDER_SITE_OTHER): Payer: Medicare Other

## 2020-09-01 DIAGNOSIS — L603 Nail dystrophy: Secondary | ICD-10-CM | POA: Diagnosis not present

## 2020-09-03 DIAGNOSIS — L603 Nail dystrophy: Secondary | ICD-10-CM | POA: Diagnosis not present

## 2020-09-03 NOTE — Telephone Encounter (Signed)
I spent 5 total minutes of online digital evaluation and management services. 

## 2020-09-04 LAB — CBC WITH DIFFERENTIAL/PLATELET
Absolute Monocytes: 1950 cells/uL — ABNORMAL HIGH (ref 200–950)
Basophils Absolute: 80 cells/uL (ref 0–200)
Basophils Relative: 0.4 %
Eosinophils Absolute: 199 cells/uL (ref 15–500)
Eosinophils Relative: 1 %
HCT: 44.7 % (ref 38.5–50.0)
Hemoglobin: 15.2 g/dL (ref 13.2–17.1)
Lymphs Abs: 14925 cells/uL — ABNORMAL HIGH (ref 850–3900)
MCH: 31.6 pg (ref 27.0–33.0)
MCHC: 34 g/dL (ref 32.0–36.0)
MCV: 92.9 fL (ref 80.0–100.0)
MPV: 11.8 fL (ref 7.5–12.5)
Monocytes Relative: 9.8 %
Neutro Abs: 2746 cells/uL (ref 1500–7800)
Neutrophils Relative %: 13.8 %
Platelets: 124 10*3/uL — ABNORMAL LOW (ref 140–400)
RBC: 4.81 10*6/uL (ref 4.20–5.80)
RDW: 13.2 % (ref 11.0–15.0)
Total Lymphocyte: 75 %
WBC: 19.9 10*3/uL — ABNORMAL HIGH (ref 3.8–10.8)

## 2020-09-04 LAB — COMPREHENSIVE METABOLIC PANEL
AG Ratio: 1.8 (calc) (ref 1.0–2.5)
ALT: 12 U/L (ref 9–46)
AST: 13 U/L (ref 10–35)
Albumin: 4.2 g/dL (ref 3.6–5.1)
Alkaline phosphatase (APISO): 65 U/L (ref 35–144)
BUN: 14 mg/dL (ref 7–25)
CO2: 30 mmol/L (ref 20–32)
Calcium: 9.1 mg/dL (ref 8.6–10.3)
Chloride: 106 mmol/L (ref 98–110)
Creat: 1.02 mg/dL (ref 0.70–1.28)
Globulin: 2.3 g/dL (calc) (ref 1.9–3.7)
Glucose, Bld: 103 mg/dL — ABNORMAL HIGH (ref 65–99)
Potassium: 4.4 mmol/L (ref 3.5–5.3)
Sodium: 140 mmol/L (ref 135–146)
Total Bilirubin: 0.5 mg/dL (ref 0.2–1.2)
Total Protein: 6.5 g/dL (ref 6.1–8.1)

## 2020-09-10 ENCOUNTER — Other Ambulatory Visit: Payer: Self-pay | Admitting: Sports Medicine

## 2020-09-10 DIAGNOSIS — K279 Peptic ulcer, site unspecified, unspecified as acute or chronic, without hemorrhage or perforation: Secondary | ICD-10-CM

## 2020-09-11 ENCOUNTER — Ambulatory Visit (INDEPENDENT_AMBULATORY_CARE_PROVIDER_SITE_OTHER): Payer: Medicare Other | Admitting: Sports Medicine

## 2020-09-11 ENCOUNTER — Other Ambulatory Visit: Payer: Self-pay

## 2020-09-11 VITALS — BP 148/92 | HR 61 | Ht 71.0 in | Wt 199.1 lb

## 2020-09-11 DIAGNOSIS — R7989 Other specified abnormal findings of blood chemistry: Secondary | ICD-10-CM | POA: Diagnosis not present

## 2020-09-11 DIAGNOSIS — E782 Mixed hyperlipidemia: Secondary | ICD-10-CM

## 2020-09-11 DIAGNOSIS — C9110 Chronic lymphocytic leukemia of B-cell type not having achieved remission: Secondary | ICD-10-CM | POA: Diagnosis not present

## 2020-09-11 DIAGNOSIS — Z0000 Encounter for general adult medical examination without abnormal findings: Secondary | ICD-10-CM | POA: Diagnosis not present

## 2020-09-11 DIAGNOSIS — M48061 Spinal stenosis, lumbar region without neurogenic claudication: Secondary | ICD-10-CM | POA: Diagnosis not present

## 2020-09-11 DIAGNOSIS — E039 Hypothyroidism, unspecified: Secondary | ICD-10-CM | POA: Diagnosis not present

## 2020-09-11 NOTE — Assessment & Plan Note (Signed)
Severe spinal stenosis at multiple levels, significant instability, epidurals have not been efficacious, he will think about whether he would like a referral to a surgeon however it would likely involve a multilevel fusion type procedure.

## 2020-09-11 NOTE — Assessment & Plan Note (Addendum)
Medicare physical as above. Checking routine labs for He is due for Shingrix, he is going to look into it and let me know. He is also due for the pneumococcal 20 vaccine, we will give him some information on this as well.

## 2020-09-11 NOTE — Assessment & Plan Note (Addendum)
Jason Abbott does have known CLL, he has noted increasing fatigue, checking labs again but I do think he needs to get back in with his oncologist.  Update: TSH elevated, adding T3, T4 to see whether or not he is euthyroid.

## 2020-09-11 NOTE — Progress Notes (Addendum)
Subjective:   Jason Abbott. is a 75 y.o. male who presents for Medicare Annual/Subsequent preventive examination.      Objective:    Today's Vitals   09/11/20 0928 09/11/20 0949  BP: (!) 151/102 (!) 148/92  Pulse: 61   SpO2: 96%   Weight: 199 lb 1.9 oz (90.3 kg)   Height: '5\' 11"'$  (1.803 m)    Body mass index is 27.77 kg/m.  Advanced Directives 08/22/2019 04/19/2019 03/03/2019 09/02/2018 10/05/2014 10/03/2013  Does Patient Have a Medical Advance Directive? No No No No No No  Would patient like information on creating a medical advance directive? No - Patient declined No - Patient declined No - Patient declined No - Patient declined No - patient declined information Yes - Educational materials given    Current Medications (verified) Outpatient Encounter Medications as of 09/11/2020  Medication Sig   MAGNESIUM-OXIDE PO Take 400 mg by mouth daily.    Multiple Vitamin (MULTIVITAMIN) tablet Take 1 tablet by mouth daily.   terbinafine (LAMISIL) 250 MG tablet Take 1 tablet (250 mg total) by mouth daily.   No facility-administered encounter medications on file as of 09/11/2020.    Allergies (verified) Patient has no known allergies.   History: Past Medical History:  Diagnosis Date   Bowel obstruction ([facility]CC)    Diverticul disease small and large intestine, no perforati or abscess    H/O urinary infection    Stroke ([place]) 01/2019   Past Surgical History:  Procedure Laterality Date   HERNIA REPAIR     MECKEL DIVERTICULUM EXCISION     ORIF FINGER / THUMB FRACTURE     Family History  Problem Relation Age of Onset   Diabetes Sister    Cancer Mother        pancreatic   Diabetes Brother    Diabetes Brother    Social History   Socioeconomic History   Marital status: Married    Spouse name: [name]   Number of children: Not on file   Years of education: Not on file   Highest education level: Some college, no degree  Occupational History    Comment: retired Corporate treasurer, Biomedical scientist, works  part time  Tobacco Use   Smoking status: Former    Packs/day: 0.50    Years: 23.00    Pack years: 11.50    Types: Cigarettes    Quit date: 03/05/1988    Years since quitting: 32.5   Smokeless tobacco: Never  Vaping Use   Vaping Use: Never used  Substance and Sexual Activity   Alcohol use: Yes    Alcohol/week: 6.0 standard drinks    Types: 6 Cans of beer per week   Drug use: No   Sexual activity: Not on file  Other Topics Concern   Not on file  Social History Narrative   lives with wife   Caffeine- coffee 1-2 daily   Social Determinants of Health   Financial Resource Strain: Not on file  Food Insecurity: Not on file  Transportation Needs: Not on file  Physical Activity: Not on file  Stress: Not on file  Social Connections: Not on file    Tobacco Counseling Counseling given: Not Answered   Activities of Daily Living No flowsheet data found.  Patient Care Team: Silverio Decamp, MD as PCP - General (Family Medicine) Silverio Decamp, MD as Consulting Physician (Sports Medicine)  Indicate any recent Medical Services you may have received from other than [facility] providers in the past year (date  may be approximate).     Assessment:   This is a routine wellness examination for [place].  Hearing/Vision screen No results found.  Dietary issues and exercise activities discussed:     Goals Addressed   None   Depression Screen PHQ 2/9 Scores 02/28/2020 01/12/2018  PHQ - 2 Score 0 0    Fall Risk Fall Risk  02/28/2020 08/04/2018  Falls in the past year? 0 (No Data)  Comment - [facility] Telephone Survey: data to providers prior to load  Number falls in past yr: 0 (No Data)  Comment - [facility] Telephone Survey Actual Response =   Injury with Fall? 0 -    FALL RISK PREVENTION PERTAINING TO THE HOME:  Any stairs in or around the home? Yes  If so, are there any without handrails? No  Home free of loose throw rugs in walkways, pet beds, electrical cords, etc? No   Adequate lighting in your home to reduce risk of falls? Yes   ASSISTIVE DEVICES UTILIZED TO PREVENT FALLS:  Life alert? No  Use of a cane, walker or w/c? No  Grab bars in the bathroom? Yes  Shower chair or bench in shower? No  Elevated toilet seat or a handicapped toilet? No   TIMED UP AND GO:  Was the test performed? No .  Not needed  Gait steady and fast without use of assistive device  Cognitive Function:        Immunizations Immunization History  Administered Date(s) Administered   Influenza-Unspecified 10/25/2015   Pneumococcal Polysaccharide-23 01/19/2012   Tdap 01/07/2015    TDAP status: Up to date  Flu Vaccine status: Due, Education has been provided regarding the importance of this vaccine. Advised may receive this vaccine at local pharmacy or Health Dept. Aware to provide a copy of the vaccination record if obtained from local pharmacy or Health Dept. Verbalized acceptance and understanding.  Pneumococcal vaccine status: Up to date  Covid-19 vaccine status: Information provided on how to obtain vaccines.   Qualifies for Shingles Vaccine? Yes   Zostavax completed Yes   Shingrix Completed?: No.    Education has been provided regarding the importance of this vaccine. Patient has been advised to call insurance company to determine out of pocket expense if they have not yet received this vaccine. Advised may also receive vaccine at local pharmacy or Health Dept. Verbalized acceptance and understanding.  Screening Tests Health Maintenance  Topic Date Due   COVID-19 Vaccine (1) Never done   Zoster Vaccines- Shingrix (1 of 2) Never done   PNA vac Low Risk Adult (2 of 2 - PCV13) 01/18/2013   TETANUS/TDAP  01/06/2025   Hepatitis C Screening  Completed   HPV VACCINES  Aged Out   INFLUENZA VACCINE  Discontinued    Health Maintenance  Health Maintenance Due  Topic Date Due   COVID-19 Vaccine (1) Never done   Zoster Vaccines- Shingrix (1 of 2) Never done   PNA  vac Low Risk Adult (2 of 2 - PCV13) 01/18/2013    Colorectal cancer screening: No longer required.   Lung Cancer Screening: (Low Dose CT Chest recommended if Age 26-80 years, 30 pack-year currently smoking OR have quit w/in 15years.) does not qualify.   Additional Screening:  Hepatitis C Screening: does not qualify  Vision Screening: Recommended annual ophthalmology exams for early detection of glaucoma and other disorders of the eye. Is the patient up to date with their annual eye exam?  Yes   Dental Screening: Recommended annual  dental exams for proper oral hygiene  Community Resource Referral / Chronic Care Management: CRR required this visit?  No   CCM required this visit?  No      Plan:  \ Annual physical exam Medicare physical as above. Checking routine labs for He is due for Shingrix, he is going to look into it and let me know. He is also due for the pneumococcal 20 vaccine, we will give him some information on this as well.  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  Update: TSH elevated, adding T3, T4 to see whether or not he is euthyroid.  Lumbar spinal stenosis Severe spinal stenosis at multiple levels, significant instability, epidurals have not been efficacious, he will think about whether he would like a referral to a surgeon however it would likely involve a multilevel fusion type procedure.  I have personally reviewed and noted the following in the patient's chart:   Medical and social history Use of alcohol, tobacco or illicit drugs  Current medications and supplements including opioid prescriptions. Patient is not currently taking opioid prescriptions. Functional ability and status Nutritional status Physical activity Advanced directives List of other physicians Hospitalizations, surgeries, and ER visits in  previous 12 months Vitals Screenings to include cognitive, depression, and falls Referrals and appointments  In addition, I have reviewed and discussed with patient certain preventive protocols, quality metrics, and best practice recommendations. A written personalized care plan for preventive services as well as general preventive health recommendations were provided to patient.     Aundria Mems, MD   09/12/2020

## 2020-09-12 ENCOUNTER — Telehealth: Payer: Self-pay

## 2020-09-12 DIAGNOSIS — R7989 Other specified abnormal findings of blood chemistry: Secondary | ICD-10-CM | POA: Insufficient documentation

## 2020-09-12 NOTE — Addendum Note (Signed)
Addended by: Silverio Decamp on: 09/12/2020 12:37 PM   Modules accepted: Orders

## 2020-09-13 LAB — CBC
HCT: 46.8 % (ref 38.5–50.0)
Hemoglobin: 15.4 g/dL (ref 13.2–17.1)
MCH: 30.9 pg (ref 27.0–33.0)
MCHC: 32.9 g/dL (ref 32.0–36.0)
MCV: 94 fL (ref 80.0–100.0)
MPV: 12.2 fL (ref 7.5–12.5)
Platelets: 124 10*3/uL — ABNORMAL LOW (ref 140–400)
RBC: 4.98 10*6/uL (ref 4.20–5.80)
RDW: 13.3 % (ref 11.0–15.0)
WBC: 22.5 10*3/uL — ABNORMAL HIGH (ref 3.8–10.8)

## 2020-09-13 LAB — COMPREHENSIVE METABOLIC PANEL
AG Ratio: 1.9 (calc) (ref 1.0–2.5)
ALT: 11 U/L (ref 9–46)
AST: 13 U/L (ref 10–35)
Albumin: 4.5 g/dL (ref 3.6–5.1)
Alkaline phosphatase (APISO): 68 U/L (ref 35–144)
BUN: 12 mg/dL (ref 7–25)
CO2: 30 mmol/L (ref 20–32)
Calcium: 9.5 mg/dL (ref 8.6–10.3)
Chloride: 105 mmol/L (ref 98–110)
Creat: 1.08 mg/dL (ref 0.70–1.28)
Globulin: 2.4 g/dL (calc) (ref 1.9–3.7)
Glucose, Bld: 100 mg/dL — ABNORMAL HIGH (ref 65–99)
Potassium: 4.2 mmol/L (ref 3.5–5.3)
Sodium: 141 mmol/L (ref 135–146)
Total Bilirubin: 0.7 mg/dL (ref 0.2–1.2)
Total Protein: 6.9 g/dL (ref 6.1–8.1)

## 2020-09-13 LAB — LIPID PANEL
Cholesterol: 171 mg/dL (ref <200)
HDL: 39 mg/dL — ABNORMAL LOW (ref >=40)
LDL Cholesterol (Calc): 107 mg/dL (calc) — ABNORMAL HIGH
Non-HDL Cholesterol (Calc): 132 mg/dL (calc) — ABNORMAL HIGH (ref <130)
Total CHOL/HDL Ratio: 4.4 (calc) (ref <5.0)
Triglycerides: 130 mg/dL (ref <150)

## 2020-09-13 LAB — TEST AUTHORIZATION

## 2020-09-13 LAB — T3, FREE: T3, Free: 3.3 pg/mL (ref 2.3–4.2)

## 2020-09-13 LAB — TSH: TSH: 4.79 mIU/L — ABNORMAL HIGH (ref 0.40–4.50)

## 2020-09-13 LAB — T4, FREE: Free T4: 1.2 ng/dL (ref 0.8–1.8)

## 2020-10-01 IMAGING — CT CT ABD-PELV W/ CM
2 of 4 series · 12 of 36 positions shown, 17 images · IV contrast (APPLIED)
Comparison: None.

CLINICAL DATA: Recently diagnosed CLL, for staging

EXAM:
CT ABDOMEN AND PELVIS WITH CONTRAST
TECHNIQUE: Multidetector CT imaging of the abdomen and pelvis was performed
using the standard protocol following bolus administration of
intravenous contrast.
CONTRAST:  100mL OMNIPAQUE IOHEXOL 300 MG/ML  SOLN

[Series 2: axial st · axial · 0.98mm/px · z∈[-564,-89]mm · 11 of 113 slices shown, 15 images]
[im 12/113  soft-tissue]
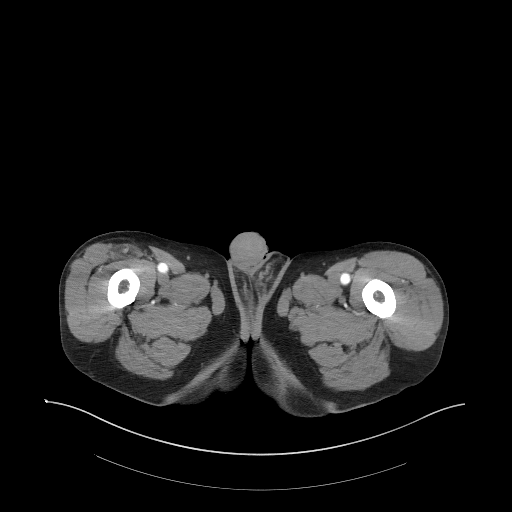
[im 12/113  bone]
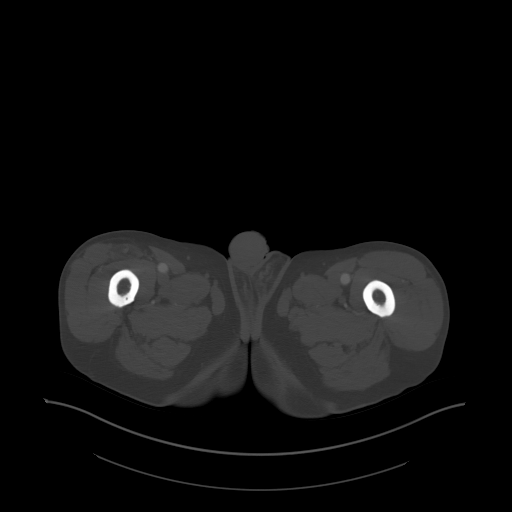
[im 23/113  soft-tissue]
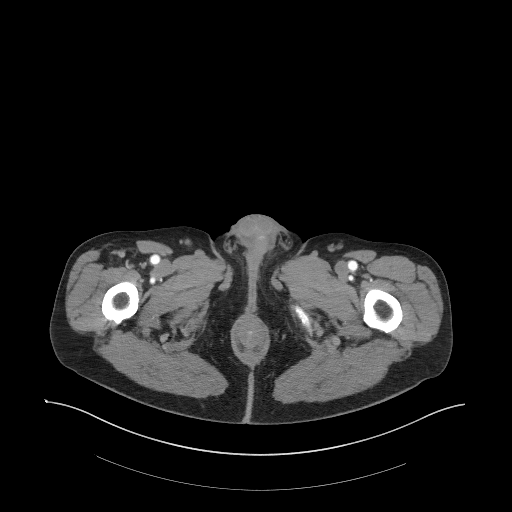
[im 34/113  soft-tissue]
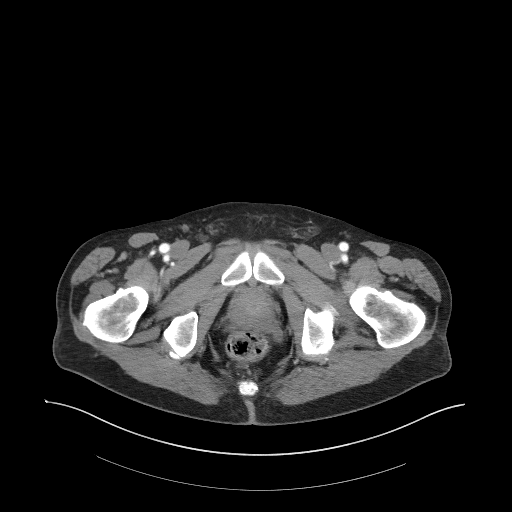
[im 45/113  soft-tissue]
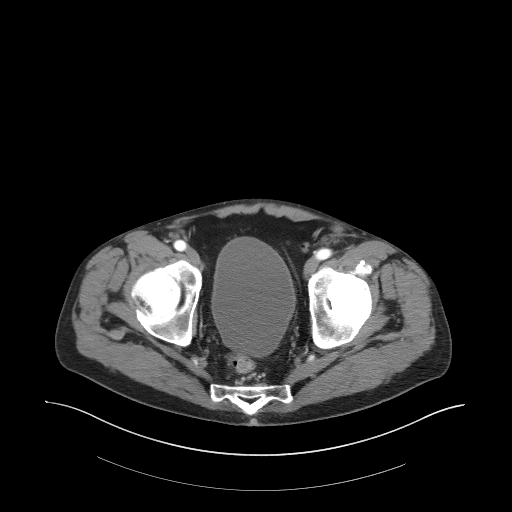
[im 57/113  soft-tissue]
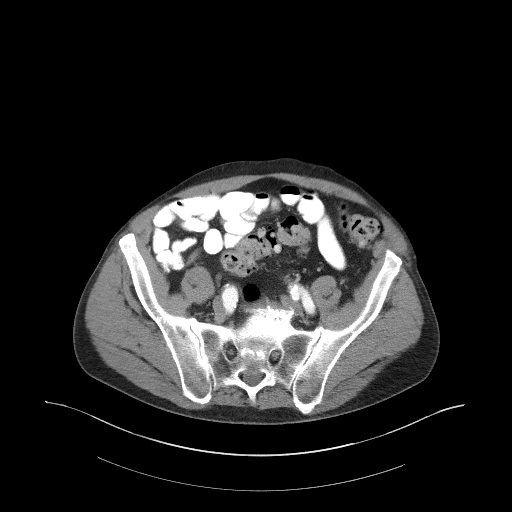
[im 68/113  soft-tissue]
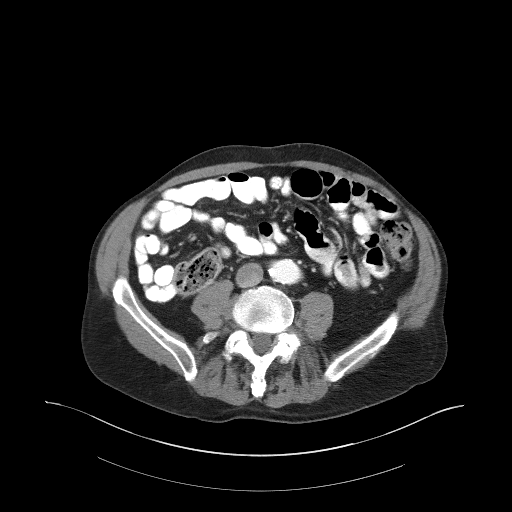
[im 79/113  soft-tissue]
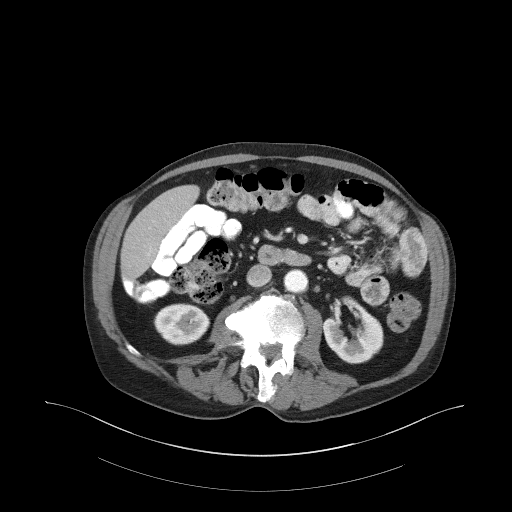
[im 90/113  soft-tissue]
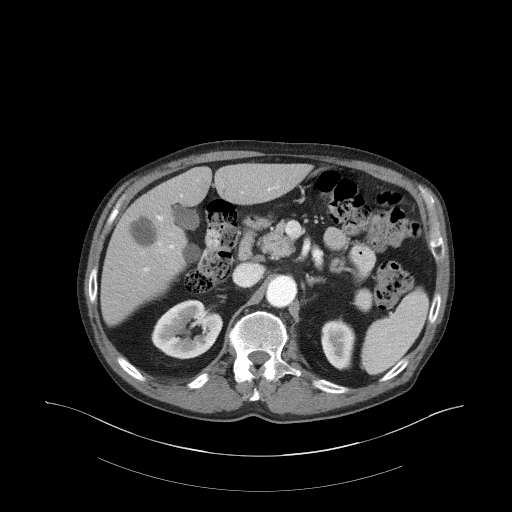
[im 90/113  lung]
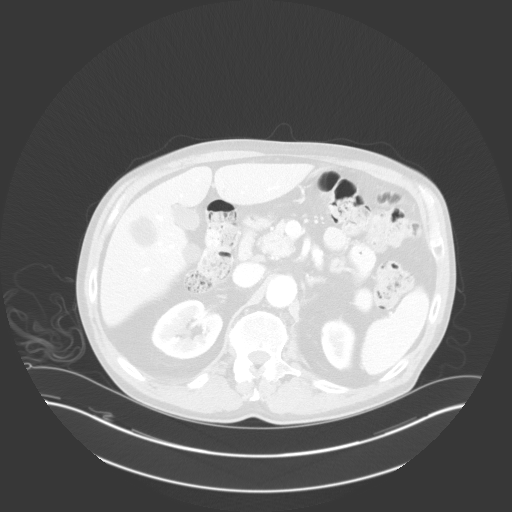
[im 96/113  lung]
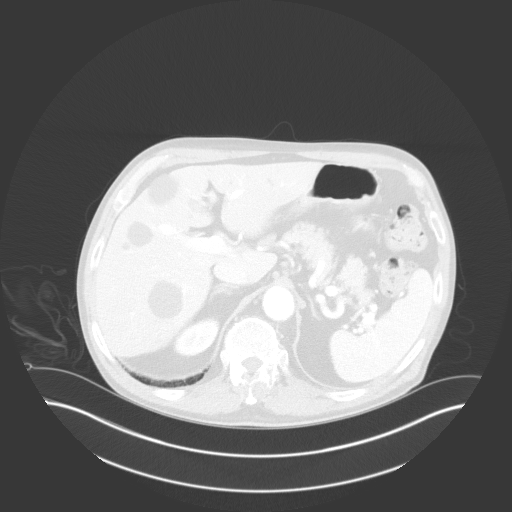
[im 101/113  soft-tissue]
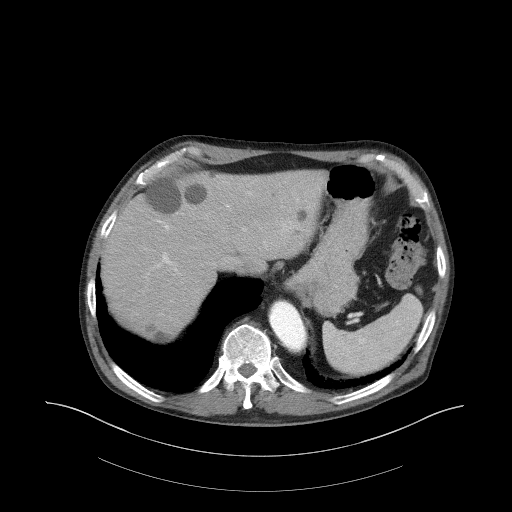
[im 101/113  lung]
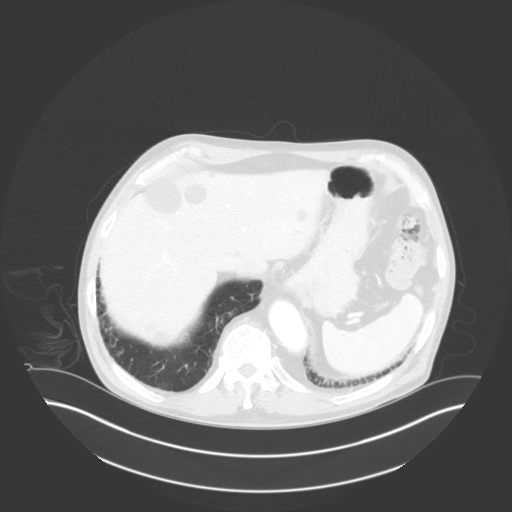
[im 101/113  bone]
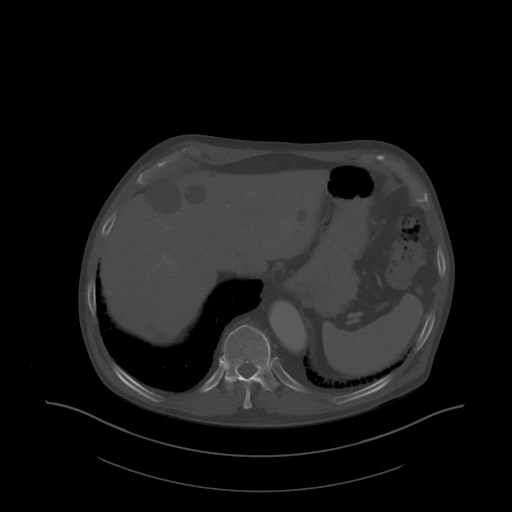
[im 107/113  lung]
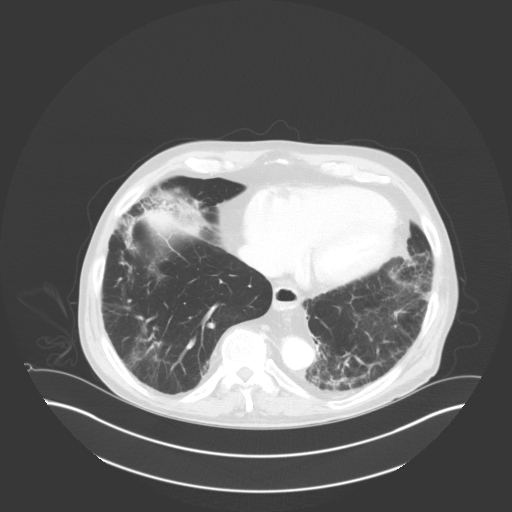

[Series 6: sagittal st · sagittal · 0.64mm/px · 1 of 129 slices shown, 2 images]
[im 43/129  soft-tissue]
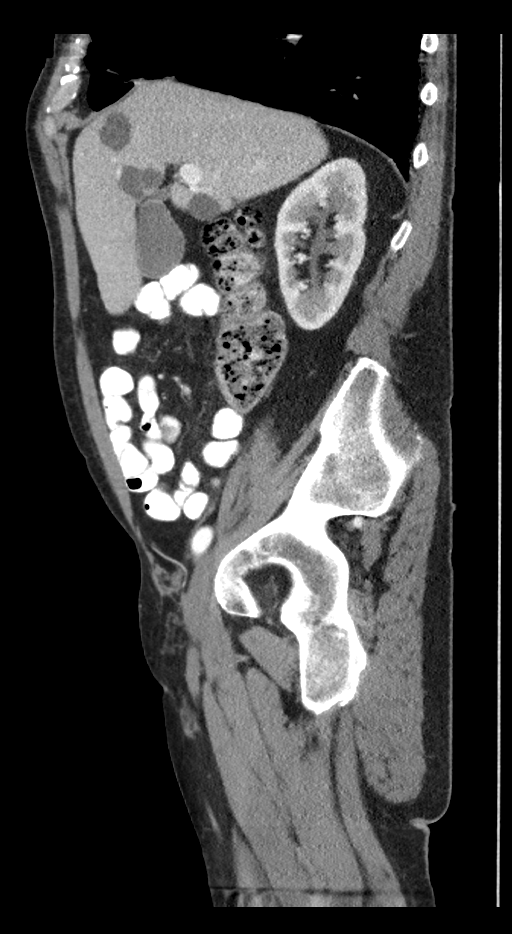
[im 43/129  bone]
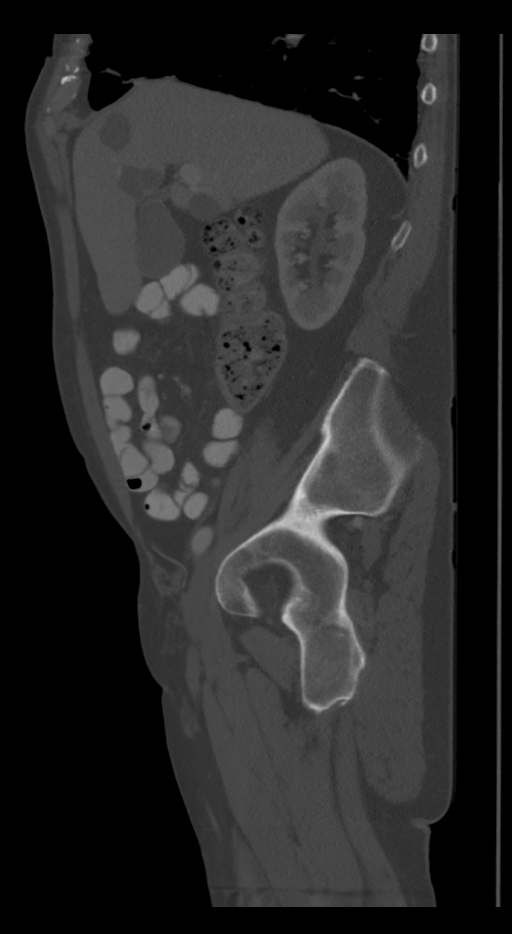

[12 of 36 positions shown; findings below may reference images not displayed]

FINDINGS: Lower chest: Subpleural patchy opacities at the lung bases, post
infectious inflammatory scarring in this patient with recent COVID
pneumonia.

Hepatobiliary: Numerous hepatic cysts bilaterally, measuring up to
4.5 cm.

Gallbladder is unremarkable. No intrahepatic or extrahepatic ductal
dilatation.

Pancreas: Within normal limits.

Spleen: Within normal limits.

Adrenals/Urinary Tract: Adrenal glands are within normal limits.

Subcentimeter renal cysts bilaterally.  No hydronephrosis.

Bladder is within normal limits.

Stomach/Bowel: Stomach is within normal limits.

No evidence of bowel obstruction.

Appendix is not discretely visualized.

Scattered mild sigmoid diverticulosis, without evidence of
diverticulitis.

Vascular/Lymphatic: No evidence of abdominal aortic aneurysm.

Atherosclerotic calcifications of the abdominal aorta and branch
vessels.

No suspicious abdominopelvic lymphadenopathy.

Reproductive: Prostate is unremarkable.

Other: No abdominopelvic ascites.

Postsurgical changes related to prior left inguinal hernia repair.

Musculoskeletal: Degenerative changes of the visualized
thoracolumbar spine.

Grade 1 spondylolisthesis at L5-S1.
IMPRESSION: No suspicious lymphadenopathy in this patient with new diagnosis of
CLL.

Spleen is normal in size.

Additional ancillary findings as above.

## 2020-10-03 ENCOUNTER — Emergency Department (INDEPENDENT_AMBULATORY_CARE_PROVIDER_SITE_OTHER)
Admission: RE | Admit: 2020-10-03 | Discharge: 2020-10-03 | Disposition: A | Discharge status: Home / Self Care | Payer: Medicare Other | Source: Ambulatory Visit

## 2020-10-03 ENCOUNTER — Other Ambulatory Visit: Payer: Self-pay

## 2020-10-03 VITALS — BP 124/77 | HR 67 | Temp 98.1°F | Resp 18

## 2020-10-03 DIAGNOSIS — N390 Urinary tract infection, site not specified: Secondary | ICD-10-CM

## 2020-10-03 LAB — POCT URINALYSIS DIP (MANUAL ENTRY)
Bilirubin, UA: NEGATIVE
Glucose, UA: NEGATIVE mg/dL
Ketones, POC UA: NEGATIVE mg/dL
Nitrite, UA: NEGATIVE
Protein Ur, POC: 100 mg/dL — AB
Spec Grav, UA: 1.025 (ref 1.010–1.025)
Urobilinogen, UA: 0.2 E.U./dL
pH, UA: 6 (ref 5.0–8.0)

## 2020-10-03 MED ORDER — NITROFURANTOIN MONOHYD MACRO 100 MG PO CAPS: 100.0000 mg | ORAL_CAPSULE | Freq: Two times a day (BID) | ORAL | 0 refills | Status: AC

## 2020-10-03 NOTE — Discharge Instructions (Addendum)
Advised patient to take medication as directed with food to completion.  Encouraged patient increase daily water intake while taking this medication.  Advised patient we will follow-up with urine culture results once received.

## 2020-10-03 NOTE — ED Provider Notes (Signed)
[facility]    CSN: [id number] Arrival date & time: 10/03/20  1157      History   Chief Complaint Chief Complaint  Patient presents with   Dysuria    HPI Jason Abbott. is a 75 y.o. male.   HPI 75 year old male presents with dysuria x2 days.  Reports history of UTIs.  PMH significant for pyelonephritis and CLL.  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/27/2019   Leukocytosis 01/23/2019   Lumbar spinal stenosis 12/21/2018   Left hand pain 10/20/2018   Hyperlipidemia, mixed 06/01/2018   Seborrheic keratosis 01/12/2018   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   Cubital tunnel syndrome 03/09/2017   Annual physical exam 04/26/2015   Prostatitis with Urosepsis 05/11/2014   Bacterial infection due to Klebsiella pneumoniae 04/20/2014   Pyelonephritis 04/17/2014   Combined forms of age-related cataract of left eye 03/23/2014   Regular astigmatism of left eye 03/23/2014   Dyspepsia 02/23/2014   Shingles outbreak 10/03/2013   Right shoulder pain 10/03/2013    Past Surgical History:  Procedure Laterality Date   HERNIA REPAIR     MECKEL DIVERTICULUM EXCISION     ORIF FINGER / [place] Medications    Prior to Admission medications   Medication Sig Start Date End Date Taking? Authorizing Provider  nitrofurantoin, macrocrystal-monohydrate, (MACROBID) 100 MG capsule Take 1 capsule (100 mg total) by mouth 2 (two) times daily for 7 days. 10/03/20 10/10/20 Yes Eliezer Lofts, FNP  MAGNESIUM-OXIDE PO Take 400 mg by mouth daily.     [provider]  Multiple Vitamin (MULTIVITAMIN) tablet Take 1 tablet by mouth daily.    [provider]  terbinafine (LAMISIL) 250 MG tablet Take 1 tablet (250 mg total) by mouth daily. 08/15/20 11/07/20  Silverio Decamp, MD    Family History Family History  Problem Relation Age of Onset   Diabetes Sister    Cancer Mother        pancreatic   Diabetes Brother    Diabetes Brother     Social History Social History   Tobacco Use   Smoking status: Former    Packs/day: 0.50    Years: 23.00    Pack years: 11.50    Types: Cigarettes    Quit date: 03/05/1988    Years since quitting: 32.6   Smokeless tobacco: Never  Vaping Use   Vaping Use: Never used  Substance Use Topics   Alcohol use: Yes    Alcohol/week: 6.0 standard drinks    Types: 6 Cans of beer per week   Drug use: No     Allergies   Patient has no known allergies.   Review of Systems Review of Systems  Genitourinary:  Positive for dysuria.  All other systems reviewed and are negative.   Physical Exam Triage Vital Signs ED Triage Vitals  Enc Vitals Group     BP 10/03/20 1207 124/77  Pulse Rate 10/03/20 1207 67     Resp 10/03/20 1207 18     Temp 10/03/20 1207 98.1 F (36.7 C)     Temp Source 10/03/20 1207 Oral     SpO2 10/03/20 1207 96 %     Weight --      Height --      Head Circumference --      Peak Flow --      Pain Score 10/03/20 1208 1     Pain Loc --      Pain Edu? --      Excl. in [place]? --    No data found.  Updated Vital Signs BP 124/77 (BP Location: Right Arm)   Pulse 67   Temp 98.1 F (36.7 C) (Oral)   Resp 18   SpO2 96%   Physical Exam Vitals and nursing note reviewed.  Constitutional:      General: He is not in acute distress.    Appearance: Normal appearance. He is normal weight. He is not ill-appearing.  HENT:     Head: Normocephalic and atraumatic.     Right Ear: Tympanic membrane,  ear canal and external ear normal.     Left Ear: Tympanic membrane, ear canal and external ear normal.     Mouth/Throat:     Mouth: Mucous membranes are moist.     Pharynx: Oropharynx is clear.  Eyes:     Extraocular Movements: Extraocular movements intact.     Conjunctiva/sclera: Conjunctivae normal.     Pupils: Pupils are equal, round, and reactive to light.  Cardiovascular:     Rate and Rhythm: Normal rate and regular rhythm.     Pulses: Normal pulses.     Heart sounds: Normal heart sounds.  Pulmonary:     Effort: Pulmonary effort is normal.     Breath sounds: Normal breath sounds.  Musculoskeletal:        General: Normal range of motion.     Cervical back: Normal range of motion and neck supple.  Skin:    General: Skin is warm and dry.  Neurological:     General: No focal deficit present.     Mental Status: He is alert and oriented to person, place, and time. Mental status is at baseline.  Psychiatric:        Mood and Affect: Mood normal.        Behavior: Behavior normal.        Thought Content: Thought content normal.     UC Treatments / Results  Labs (all labs ordered are listed, but only abnormal results are displayed) Labs Reviewed  POCT URINALYSIS DIP (MANUAL ENTRY) - Abnormal; Notable for the following components:      Result Value   Clarity, UA turbid (*)    Blood, UA moderate (*)    Protein Ur, POC =100 (*)    Leukocytes, UA Large (3+) (*)    All other components within normal limits  URINE CULTURE    EKG   Radiology No results found.  Procedures Procedures (including critical care time)  Medications Ordered in UC Medications - No data to display  Initial Impression / Assessment and Plan / UC Course  I have reviewed the triage vital signs and the nursing notes.  Pertinent labs & imaging results that were available during my care of the patient were reviewed by me and considered in my medical decision making (see chart for details).     MDM:  1.  Urinary tract  infectious disease-Rx'd Macrobid, urine culture ordered. Advised patient to take medication as directed with food to completion.  Encouraged patient increase daily water intake while taking this medication.  Advised patient we will follow-up with urine culture results once received.  Discharged home, hemodynamically stable. Final Clinical Impressions(s) / UC Diagnoses   Final diagnoses:  Lower urinary tract infectious disease     Discharge Instructions      Advised patient to take medication as directed with food to completion.  Encouraged patient increase daily water intake while taking this medication.  Advised patient we will follow-up with urine culture results once received.     ED Prescriptions     Medication Sig Dispense Auth. Provider   nitrofurantoin, macrocrystal-monohydrate, (MACROBID) 100 MG capsule Take 1 capsule (100 mg total) by mouth 2 (two) times daily for 7 days. 14 capsule Eliezer Lofts, FNP      PDMP not reviewed this encounter.   Eliezer Lofts, [place] 10/03/20 1314

## 2020-10-03 NOTE — ED Triage Notes (Signed)
Pt c/o dysuria x 2 days. Hx of frequent UTIs.

## 2020-10-05 LAB — URINE CULTURE
MICRO NUMBER:: 12434227
SPECIMEN QUALITY:: ADEQUATE

## 2020-10-14 ENCOUNTER — Other Ambulatory Visit: Payer: Self-pay

## 2020-10-14 ENCOUNTER — Emergency Department (INDEPENDENT_AMBULATORY_CARE_PROVIDER_SITE_OTHER)
Admission: RE | Admit: 2020-10-14 | Discharge: 2020-10-14 | Disposition: A | Discharge status: Home / Self Care | Payer: Medicare Other | Source: Ambulatory Visit | Attending: Family Medicine | Admitting: Family Medicine

## 2020-10-14 VITALS — BP 115/74 | HR 59 | Temp 98.2°F | Resp 20 | Ht 71.0 in | Wt 195.0 lb

## 2020-10-14 DIAGNOSIS — R3915 Urgency of urination: Secondary | ICD-10-CM | POA: Diagnosis not present

## 2020-10-14 DIAGNOSIS — N390 Urinary tract infection, site not specified: Secondary | ICD-10-CM | POA: Diagnosis not present

## 2020-10-14 LAB — POCT URINALYSIS DIP (MANUAL ENTRY)
Bilirubin, UA: NEGATIVE
Glucose, UA: NEGATIVE mg/dL
Ketones, POC UA: NEGATIVE mg/dL
Nitrite, UA: NEGATIVE
Protein Ur, POC: 100 mg/dL — AB
Spec Grav, UA: 1.025 (ref 1.010–1.025)
Urobilinogen, UA: 0.2 E.U./dL
pH, UA: 7 (ref 5.0–8.0)

## 2020-10-14 MED ORDER — CIPROFLOXACIN HCL 500 MG PO TABS: 500.0000 mg | ORAL_TABLET | Freq: Two times a day (BID) | ORAL | 0 refills | Status: DC

## 2020-10-14 MED ORDER — MAGNESIUM OXIDE -MG SUPPLEMENT 400 (240 MG) MG PO TABS: 400.0000 mg | ORAL_TABLET | Freq: Every day | ORAL | 3 refills | Status: DC

## 2020-10-14 NOTE — Discharge Instructions (Addendum)
Drink plenty of water Take antibiotic 2 times a day for 10 days I have sent in a refill of your magnesium oxide Follow-up with Dr. Dianah Field regarding possible prostate enlargement

## 2020-10-14 NOTE — ED Provider Notes (Signed)
[name]    CSN: [id number] Arrival date & time: 10/14/20  1117      History   Chief Complaint Chief Complaint  Patient presents with   Appt 11:00   Urinary Frequency   Dysuria    HPI Jason Abbott. is a 75 y.o. male.   HPI  Patient has recurring urinary tract infections.  He was hospitalized for one of them several years ago.  He was discharged on Flomax.  He took that 30 days but never got it refilled.  He saw urologist during the consultation.  He is not followed by urology.  He has continued to have recurring infections.  He was last seen here 10/03/2020.  He has had a number of different organisms including Enterococcus, E. coli, and Klebsiella.  He states he is never been diagnosed with an enlarged prostate.  He has never been put on medication to shrink his prostate or reduce his number of infections preventatively.  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/27/2019   Leukocytosis 01/23/2019   Lumbar spinal stenosis 12/21/2018   Left hand pain 10/20/2018   Hyperlipidemia, mixed 06/01/2018   Seborrheic keratosis 01/12/2018   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   Cubital tunnel syndrome 03/09/2017   Annual physical exam 04/26/2015   Prostatitis with Urosepsis 05/11/2014   Bacterial infection due to Klebsiella pneumoniae 04/20/2014   Pyelonephritis 04/17/2014   Combined forms of age-related cataract of left eye 03/23/2014   Regular astigmatism of left eye  03/23/2014   Dyspepsia 02/23/2014   Shingles outbreak 10/03/2013   Right shoulder pain 10/03/2013    Past Surgical History:  Procedure Laterality Date   HERNIA REPAIR     MECKEL DIVERTICULUM EXCISION     ORIF FINGER / [place] Medications    Prior to Admission medications   Medication Sig Start Date End Date Taking? Authorizing Provider  ciprofloxacin (CIPRO) 500 MG tablet Take 1 tablet (500 mg total) by mouth 2 (two) times daily. 10/14/20  Yes Raylene Everts, MD  MAGnesium-Oxide 400 (240 Mg) MG tablet Take 1 tablet (400 mg total) by mouth daily. 10/14/20   Raylene Everts, MD  Multiple Vitamin (MULTIVITAMIN) tablet Take 1 tablet by mouth daily.    [provider]  terbinafine (LAMISIL) 250 MG tablet Take 1 tablet (250 mg total) by mouth daily. 08/15/20 11/07/20  Silverio Decamp, MD    Family History Family History  Problem Relation Age of Onset   Cancer Mother        pancreatic   COPD Father    Diabetes Sister    Diabetes Brother    Diabetes Brother     Social History Social History   Tobacco Use   Smoking status: Former    Packs/day: 0.50    Years: 23.00    Pack years: 11.50    Types: Cigarettes    Quit date: 03/05/1988  Years since quitting: 32.6   Smokeless tobacco: Never  Vaping Use   Vaping Use: Never used  Substance Use Topics   Alcohol use: Yes    Alcohol/week: 6.0 standard drinks    Types: 6 Cans of beer per week    Comment: weekly   Drug use: No     Allergies   Patient has no known allergies.   Review of Systems Review of Systems See HPI  Physical Exam Triage Vital Signs ED Triage Vitals  Enc Vitals Group     BP 10/14/20 1134 115/74     Pulse Rate 10/14/20 1134 (!) 59     Resp 10/14/20 1134 20     Temp 10/14/20 1134 98.2 F (36.8 C)     Temp Source 10/14/20 1134 Oral     SpO2 10/14/20 1134 99 %     Weight 10/14/20 1135 195 lb (88.5 kg)     Height 10/14/20 1135 5\' 11"  (1.803 m)     Head  Circumference --      Peak Flow --      Pain Score 10/14/20 1133 0     Pain Loc --      Pain Edu? --      Excl. in [place]? --    No data found.  Updated Vital Signs BP 115/74   Pulse (!) 59   Temp 98.2 F (36.8 C) (Oral)   Resp 20   Ht 5\' 11"  (1.803 m)   Wt 88.5 kg   SpO2 99%   BMI 27.20 kg/m      Physical Exam Constitutional:      General: He is not in acute distress.    Appearance: Normal appearance. He is well-developed.  HENT:     Head: Normocephalic and atraumatic.     Mouth/Throat:     Comments: Mask is in place Eyes:     Conjunctiva/sclera: Conjunctivae normal.     Pupils: Pupils are equal, round, and reactive to light.  Cardiovascular:     Rate and Rhythm: Normal rate.  Pulmonary:     Effort: Pulmonary effort is normal. No respiratory distress.  Abdominal:     General: Bowel sounds are normal. There is no distension.     Palpations: Abdomen is soft.     Tenderness: There is no right CVA tenderness or left CVA tenderness.  Musculoskeletal:        General: Normal range of motion.     Cervical back: Normal range of motion.  Skin:    General: Skin is warm and dry.  Neurological:     General: No focal deficit present.     Mental Status: He is alert.  Psychiatric:        Mood and Affect: Mood normal.        Behavior: Behavior normal.     UC Treatments / Results  Labs (all labs ordered are listed, but only abnormal results are displayed) Labs Reviewed  POCT URINALYSIS DIP (MANUAL ENTRY) - Abnormal; Notable for the following components:      Result Value   Color, UA straw (*)    Clarity, UA cloudy (*)    Blood, UA moderate (*)    Protein Ur, POC =100 (*)    Leukocytes, UA Small (1+) (*)    All other components within normal limits  URINE CULTURE    EKG   Radiology No results found.  Procedures Procedures (including critical care time)  Medications Ordered in UC Medications - No data to  display  Initial Impression / Assessment and Plan / UC  Course  I have reviewed the triage vital signs and the nursing notes.  Pertinent labs & imaging results that were available during my care of the patient were reviewed by me and considered in my medical decision making (see chart for details).     We reviewed that the most common reason men have recurring urinary tract infections when they are his age, would be BPH.  There are medications and procedures that can be done to reduce the number of infections that he has.  I do recommend that he make an appointment and address this with his usual primary care doctor. Final Clinical Impressions(s) / UC Diagnoses   Final diagnoses:  Urinary urgency  Lower urinary tract infectious disease     Discharge Instructions      Drink plenty of water Take antibiotic 2 times a day for 10 days I have sent in a refill of your magnesium oxide Follow-up with Dr. Dianah Field regarding possible prostate enlargement   ED Prescriptions     Medication Sig Dispense Auth. Provider   MAGnesium-Oxide 400 (240 Mg) MG tablet Take 1 tablet (400 mg total) by mouth daily. 90 tablet Raylene Everts, MD   ciprofloxacin (CIPRO) 500 MG tablet Take 1 tablet (500 mg total) by mouth 2 (two) times daily. 20 tablet Raylene Everts, MD      PDMP not reviewed this encounter.   Raylene Everts, MD 10/14/20 [phone]

## 2020-10-14 NOTE — ED Triage Notes (Signed)
Pt presents to Urgent Care with c/o urinary frequency and dysuria since yesterday. Recent hx of UTI reported.

## 2020-10-16 LAB — URINE CULTURE
MICRO NUMBER:: 12480936
Result:: NO GROWTH
SPECIMEN QUALITY:: ADEQUATE

## 2020-10-22 ENCOUNTER — Other Ambulatory Visit: Payer: Self-pay

## 2020-10-22 DIAGNOSIS — C9110 Chronic lymphocytic leukemia of B-cell type not having achieved remission: Secondary | ICD-10-CM

## 2020-10-23 ENCOUNTER — Telehealth: Payer: Self-pay | Admitting: Hematology & Oncology

## 2020-10-23 ENCOUNTER — Other Ambulatory Visit: Payer: Self-pay

## 2020-10-23 ENCOUNTER — Inpatient Hospital Stay: Payer: Medicare Other | Attending: Hematology & Oncology

## 2020-10-23 ENCOUNTER — Encounter: Payer: Self-pay | Admitting: Hematology & Oncology

## 2020-10-23 ENCOUNTER — Inpatient Hospital Stay (HOSPITAL_BASED_OUTPATIENT_CLINIC_OR_DEPARTMENT_OTHER): Payer: Medicare Other | Admitting: Hematology & Oncology

## 2020-10-23 VITALS — BP 136/85 | HR 56 | Temp 98.0°F | Resp 18 | Wt 202.5 lb

## 2020-10-23 DIAGNOSIS — C9110 Chronic lymphocytic leukemia of B-cell type not having achieved remission: Secondary | ICD-10-CM | POA: Diagnosis not present

## 2020-10-23 DIAGNOSIS — Z79899 Other long term (current) drug therapy: Secondary | ICD-10-CM | POA: Diagnosis not present

## 2020-10-23 LAB — CBC WITH DIFFERENTIAL (CANCER CENTER ONLY)
Abs Immature Granulocytes: 0.02 10*3/uL (ref 0.00–0.07)
Basophils Absolute: 0.1 10*3/uL (ref 0.0–0.1)
Basophils Relative: 0 %
Eosinophils Absolute: 0.2 10*3/uL (ref 0.0–0.5)
Eosinophils Relative: 1 %
HCT: 45 % (ref 39.0–52.0)
Hemoglobin: 14.9 g/dL (ref 13.0–17.0)
Immature Granulocytes: 0 %
Lymphocytes Relative: 79 %
Lymphs Abs: 16.9 10*3/uL — ABNORMAL HIGH (ref 0.7–4.0)
MCH: 31.5 pg (ref 26.0–34.0)
MCHC: 33.1 g/dL (ref 30.0–36.0)
MCV: 95.1 fL (ref 80.0–100.0)
Monocytes Absolute: 2.2 10*3/uL — ABNORMAL HIGH (ref 0.1–1.0)
Monocytes Relative: 10 %
Neutro Abs: 2.2 10*3/uL (ref 1.7–7.7)
Neutrophils Relative %: 10 %
Platelet Count: 119 10*3/uL — ABNORMAL LOW (ref 150–400)
RBC: 4.73 MIL/uL (ref 4.22–5.81)
RDW: 13.2 % (ref 11.5–15.5)
WBC Count: 21.6 10*3/uL — ABNORMAL HIGH (ref 4.0–10.5)
nRBC: 0 % (ref 0.0–0.2)

## 2020-10-23 LAB — SAVE SMEAR(SSMR), FOR PROVIDER SLIDE REVIEW

## 2020-10-23 LAB — CMP (CANCER CENTER ONLY)
ALT: 10 U/L (ref 0–44)
AST: 14 U/L — ABNORMAL LOW (ref 15–41)
Albumin: 4.2 g/dL (ref 3.5–5.0)
Alkaline Phosphatase: 69 U/L (ref 38–126)
Anion gap: 6 (ref 5–15)
BUN: 15 mg/dL (ref 8–23)
CO2: 31 mmol/L (ref 22–32)
Calcium: 9.5 mg/dL (ref 8.9–10.3)
Chloride: 105 mmol/L (ref 98–111)
Creatinine: 1.15 mg/dL (ref 0.61–1.24)
GFR, Estimated: >60 mL/min (ref >60)
Glucose, Bld: 85 mg/dL (ref 70–99)
Potassium: 4.3 mmol/L (ref 3.5–5.1)
Sodium: 142 mmol/L (ref 135–145)
Total Bilirubin: 0.6 mg/dL (ref 0.3–1.2)
Total Protein: 6.7 g/dL (ref 6.5–8.1)

## 2020-10-23 LAB — LACTATE DEHYDROGENASE: LDH: 167 U/L (ref 98–192)

## 2020-10-23 NOTE — Telephone Encounter (Signed)
Scheduled appt per 10/18 los - mailed letter with appt date and time

## 2020-10-23 NOTE — Progress Notes (Signed)
Hematology and Oncology Follow Up Visit  Jason Abbott [id number] [birth year] 75 y.o. 10/23/2020   Principle Diagnosis:  Stage A CLL  Current Therapy:   Observation    Interim History:  Mr. Jason Abbott is here today for follow-up.  I think we last saw him back in September.  He is doing pretty well.  He is incredibly interesting to talk to.  He was former Corporate treasurer.  He is in the [employer] for I think 15 year  Even more interesting is the fact that he plays the accordion.  He goes around to assisted living center the nursing homes in place.  I am very impressed with this.  He has had no problems with respect to the CLL.  He has had no swollen lymph nodes.  He has had no fever.  He did have COVID I think back in the springtime.  He got through this without any difficulty.  He was hospitalized.  He actually was hospitalized for 9 days.  He took remdesivir.  He has had no rashes.  There is been no leg swelling.  He has had no change in bowel or bladder habits.  He has had no bleeding.  Overall, his performance status is probably ECOG 0.     Medications:  Allergies as of 10/23/2020   No Known Allergies      Medication List        Accurate as of October 23, 2020 11:08 AM. If you have any questions, ask your nurse or doctor.          ciprofloxacin 500 MG tablet Commonly known as: CIPRO Take 1 tablet (500 mg total) by mouth 2 (two) times daily.   magnesium oxide 400 (240 Mg) MG tablet Commonly known as: MAGnesium-Oxide Take 1 tablet (400 mg total) by mouth daily.   multivitamin tablet Take 1 tablet by mouth daily.   terbinafine 250 MG tablet Commonly known as: LAMISIL Take 1 tablet (250 mg total) by mouth daily.        Allergies: No Known Allergies  Past Medical History, Surgical history, Social history, and Family History were reviewed and updated.  Review of Systems: Review of Systems  Constitutional: Negative.   HENT: Negative.    Eyes: Negative.   Respiratory:  Negative.    Cardiovascular: Negative.   Gastrointestinal: Negative.   Genitourinary: Negative.   Musculoskeletal: Negative.   Skin: Negative.   Neurological: Negative.   Endo/Heme/Allergies: Negative.   Psychiatric/Behavioral: Negative.      Physical Exam:  weight is 202 lb 8 oz (91.9 kg). His oral temperature is 98 F (36.7 C). His blood pressure is 136/85 and his pulse is 56 (abnormal). His respiration is 18 and oxygen saturation is 97%.   Wt Readings from Last 3 Encounters:  10/23/20 202 lb 8 oz (91.9 kg)  10/14/20 195 lb (88.5 kg)  09/11/20 199 lb 1.9 oz (90.3 kg)    Physical Exam Vitals reviewed.  HENT:     Head: Normocephalic and atraumatic.  Eyes:     Pupils: Pupils are equal, round, and reactive to light.  Cardiovascular:     Rate and Rhythm: Normal rate and regular rhythm.     Heart sounds: Normal heart sounds.  Pulmonary:     Effort: Pulmonary effort is normal.     Breath sounds: Normal breath sounds.  Abdominal:     General: Bowel sounds are normal.     Palpations: Abdomen is soft.  Musculoskeletal:  General: No tenderness or deformity. Normal range of motion.     Cervical back: Normal range of motion.  Lymphadenopathy:     Cervical: No cervical adenopathy.  Skin:    General: Skin is warm and dry.     Findings: No erythema or rash.  Neurological:     Mental Status: He is alert and oriented to person, place, and time.  Psychiatric:        Behavior: Behavior normal.        Thought Content: Thought content normal.        Judgment: Judgment normal.     Lab Results  Component Value Date   WBC 21.6 (H) 10/23/2020   HGB 14.9 10/23/2020   HCT 45.0 10/23/2020   MCV 95.1 10/23/2020   PLT 119 (L) 10/23/2020   No results found for: FERRITIN, IRON, TIBC, UIBC, IRONPCTSAT Lab Results  Component Value Date   RBC 4.73 10/23/2020   No results found for: KPAFRELGTCHN, LAMBDASER, KAPLAMBRATIO No results found for: IGGSERUM, IGA, IGMSERUM No results  found for: Odetta Pink, SPEI   Chemistry      Component Value Date/Time   NA 142 10/23/2020 0939   K 4.3 10/23/2020 0939   CL 105 10/23/2020 0939   CO2 31 10/23/2020 0939   BUN 15 10/23/2020 0939   CREATININE 1.15 10/23/2020 0939   CREATININE 1.08 09/11/2020 0000      Component Value Date/Time   CALCIUM 9.5 10/23/2020 0939   ALKPHOS 69 10/23/2020 0939   AST 14 (L) 10/23/2020 0939   ALT 10 10/23/2020 0939   BILITOT 0.6 10/23/2020 0939       Impression and Plan: Jason Abbott is a very pleasant 75 yo caucasian gentleman with stage A CLL.  Alert has blood smear under the microscope.  I was not too impressed.  He did have a high number of lymphocytes.  Again, his blood counts are holding steady.  He has thrombocytopenia but this is no worse.  I think we can now get him through the holidays.  I will plan to get him back in about 4 months.  At some point down the road, we may have to do treatment but for right now I think we can just watch.   Volanda Napoleon, MD 10/18/202211:08 AM

## 2020-11-16 ENCOUNTER — Other Ambulatory Visit: Payer: Self-pay

## 2020-11-16 ENCOUNTER — Emergency Department (INDEPENDENT_AMBULATORY_CARE_PROVIDER_SITE_OTHER)
Admission: RE | Admit: 2020-11-16 | Discharge: 2020-11-16 | Disposition: A | Discharge status: Home / Self Care | Payer: Medicare Other | Source: Ambulatory Visit | Attending: Family Medicine | Admitting: Family Medicine

## 2020-11-16 VITALS — BP 170/73 | HR 61 | Temp 98.7°F | Resp 14

## 2020-11-16 DIAGNOSIS — R1030 Lower abdominal pain, unspecified: Secondary | ICD-10-CM

## 2020-11-16 DIAGNOSIS — N390 Urinary tract infection, site not specified: Secondary | ICD-10-CM

## 2020-11-16 LAB — POCT URINALYSIS DIP (MANUAL ENTRY)
Bilirubin, UA: NEGATIVE
Blood, UA: NEGATIVE
Glucose, UA: NEGATIVE mg/dL
Ketones, POC UA: NEGATIVE mg/dL
Nitrite, UA: POSITIVE — AB
Protein Ur, POC: NEGATIVE mg/dL
Spec Grav, UA: 1.025 (ref 1.010–1.025)
Urobilinogen, UA: 0.2 E.U./dL
pH, UA: 7.5 (ref 5.0–8.0)

## 2020-11-16 MED ORDER — AMOXICILLIN-POT CLAVULANATE 500-125 MG PO TABS: ORAL_TABLET | ORAL | 0 refills | Status: DC

## 2020-11-16 NOTE — ED Triage Notes (Signed)
Pt presents with urinary frequency and abdominal pain. Pt states he finished a course of abx for a UTI 10/19

## 2020-11-16 NOTE — ED Provider Notes (Signed)
Jason Abbott CARE    CSN: [id number] Arrival date & time: 11/16/20  1549      History   Chief Complaint Chief Complaint  Patient presents with   Abdominal Pain    HPI [name]. is a 75 y.o. male.   Patient complains of three day history of urinary frequency, dysuria, and lower abdominal discomfort. Review of records reveals that he was treated for a UTI on 10/14/20 with Cipro.  He denies fevers, chills, and sweats. He has a history of UTI's. PMH significant for pyelonephritis and CLL.   The history is provided by the patient.  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    H/O urinary infection    Stroke ([place]) 01/2019    Patient Active Problem List   Diagnosis Date Noted   Elevated TSH 09/12/2020   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   Pneumonia due to COVID-19 virus 03/16/2019   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/27/2019   Leukocytosis 01/23/2019   Lumbar spinal stenosis 12/21/2018   Left hand pain 10/20/2018   Hyperlipidemia, mixed 06/01/2018   Seborrheic keratosis 01/12/2018   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   Cubital tunnel syndrome 03/09/2017   Annual physical exam 04/26/2015   Prostatitis with Urosepsis 05/11/2014   Bacterial infection due to Klebsiella pneumoniae 04/20/2014   Pyelonephritis 04/17/2014   Combined forms of age-related cataract of left eye 03/23/2014   Regular astigmatism of left eye  03/23/2014   Dyspepsia 02/23/2014   Shingles outbreak 10/03/2013   Right shoulder pain 10/03/2013    Past Surgical History:  Procedure Laterality Date   HERNIA REPAIR     MECKEL DIVERTICULUM EXCISION     ORIF FINGER / [place] Medications    Prior to Admission medications   Medication Sig Start Date End Date Taking? Authorizing Provider  amoxicillin-clavulanate (AUGMENTIN) 500-125 MG tablet Take one tab PO Q12hr with food 11/16/20  Yes Kandra Nicolas, MD  MAGnesium-Oxide 400 (240 Mg) MG tablet Take 1 tablet (400 mg total) by mouth daily. 10/14/20   Raylene Everts, MD  Multiple Vitamin (MULTIVITAMIN) tablet Take 1 tablet by mouth daily.    [provider]    Family History Family History  Problem Relation Age of Onset   Cancer Mother        pancreatic   COPD Father    Diabetes Sister    Diabetes Brother    Diabetes Brother     Social History Social History   Tobacco Use   Smoking status: Former    Packs/day: 0.50    Years: 23.00    Pack years: 11.50    Types: Cigarettes    Quit date: 03/05/1988    Years since quitting: 32.7   Smokeless tobacco: Never  Vaping Use   Vaping Use: Never used  Substance Use Topics   Alcohol use: Yes    Alcohol/week: 6.0 standard drinks    Types:  6 Cans of beer per week    Comment: weekly   Drug use: No     Allergies   Patient has no known allergies.   Review of Systems Review of Systems  Constitutional:  Negative for chills, diaphoresis, fatigue and fever.  Gastrointestinal:  Positive for abdominal pain. Negative for nausea.  Genitourinary:  Positive for dysuria and frequency. Negative for bladder incontinence, flank pain, hematuria and urgency.  All other systems reviewed and are negative.   Physical Exam Triage Vital Signs ED Triage Vitals  Enc Vitals Group     BP 11/16/20 1658 (!) 170/73     Pulse Rate 11/16/20 1658 61     Resp 11/16/20 1658 14     Temp 11/16/20 1658 98.7 F  (37.1 C)     Temp Source 11/16/20 1658 Oral     SpO2 11/16/20 1658 96 %     Weight --      Height --      Head Circumference --      Peak Flow --      Pain Score 11/16/20 1659 0     Pain Loc --      Pain Edu? --      Excl. in [place]? --    No data found.  Updated Vital Signs BP (!) 170/73 (BP Location: Right Arm)   Pulse 61   Temp 98.7 F (37.1 C) (Oral)   Resp 14   SpO2 96%   Visual Acuity Right Eye Distance:   Left Eye Distance:   Bilateral Distance:    Right Eye Near:   Left Eye Near:    Bilateral Near:    Physical Exam Nursing notes and Vital Signs reviewed. Appearance:  Patient appears stated age, and in no acute distress.    Eyes:  Pupils are equal, round, and reactive to light and accomodation.  Extraocular movement is intact.  Conjunctivae are not inflamed   Pharynx:  Normal; moist mucous membranes  Neck:  Supple.  No adenopathy Lungs:  Clear to auscultation.  Breath sounds are equal.  Moving air well. Heart:  Regular rate and rhythm without murmurs, rubs, or gallops.  Abdomen:  Mild tenderness over bladdere without masses or hepatosplenomegaly.  Bowel sounds are present.  No CVA or flank tenderness.  Extremities:  No edema.  Skin:  No rash present.     UC Treatments / Results  Labs (all labs ordered are listed, but only abnormal results are displayed) Labs Reviewed  POCT URINALYSIS DIP (MANUAL ENTRY) - Abnormal; Notable for the following components:      Result Value   Clarity, UA cloudy (*)    Nitrite, UA Positive (*)    Leukocytes, UA Moderate (2+) (*)    All other components within normal limits  URINE CULTURE    EKG   Radiology No results found.  Procedures Procedures (including critical care time)  Medications Ordered in UC Medications - No data to display  Initial Impression / Assessment and Plan / UC Course  I have reviewed the triage vital signs and the nursing notes.  Pertinent labs & imaging results that were available during my  care of the patient were reviewed by me and considered in my medical decision making (see chart for details).    Urine culture pending. Review of records reveals a urine culture done 10/03/20 that was positive for enterococcus faecalis sensitive to ampicillin and Macrobid.  Will begin Augmentin. Followup with Family Doctor if not improved in one week.  Final Clinical Impressions(s) / UC Diagnoses   Final diagnoses:  Lower abdominal pain  Urinary tract infection without hematuria, site unspecified     Discharge Instructions      Increase fluid intake.  If symptoms become significantly worse during the night or over the weekend, proceed to the local emergency room.    ED Prescriptions     Medication Sig Dispense Auth. Provider   amoxicillin-clavulanate (AUGMENTIN) 500-125 MG tablet Take one tab PO Q12hr with food 20 tablet Kandra Nicolas, MD         Kandra Nicolas, MD 11/18/20 1115

## 2020-11-16 NOTE — Discharge Instructions (Signed)
Increase fluid intake. °If symptoms become significantly worse during the night or over the weekend, proceed to the local emergency room.  °

## 2020-11-20 ENCOUNTER — Telehealth (HOSPITAL_COMMUNITY): Payer: Self-pay | Admitting: Emergency Medicine

## 2020-11-20 LAB — URINE CULTURE
MICRO NUMBER:: 12629018
SPECIMEN QUALITY:: ADEQUATE

## 2020-11-20 MED ORDER — SULFAMETHOXAZOLE-TRIMETHOPRIM 800-160 MG PO TABS: 1.0000 | ORAL_TABLET | Freq: Two times a day (BID) | ORAL | 0 refills | Status: DC

## 2020-11-22 ENCOUNTER — Ambulatory Visit (INDEPENDENT_AMBULATORY_CARE_PROVIDER_SITE_OTHER): Payer: Medicare Other | Admitting: Sports Medicine

## 2020-11-22 ENCOUNTER — Other Ambulatory Visit: Payer: Self-pay

## 2020-11-22 DIAGNOSIS — L603 Nail dystrophy: Secondary | ICD-10-CM | POA: Diagnosis not present

## 2020-11-22 DIAGNOSIS — N410 Acute prostatitis: Secondary | ICD-10-CM | POA: Diagnosis not present

## 2020-11-22 NOTE — Assessment & Plan Note (Signed)
Jason Abbott has also now finished about 3 months of Lamisil, his toenails are starting to grow out in a normal fashion, he will do an additional 3 months before discontinuing.

## 2020-11-22 NOTE — Progress Notes (Signed)
    Procedures performed today:    None.  Independent interpretation of notes and tests performed by another provider:   None.  Brief History, Exam, Impression, and Recommendations:    Prostatitis with Urosepsis Wong returns, he is a pleasant 75 year old male, he had several episodes of prostatitis over the past several months, multiple bacterial organisms have grown out and urine cultures including Staph epidermidis, Enterococcus faecalis, and E. coli, looking at the sensitivities, they have all been sensitive to [facility], he was recently treated in the urgent care with amoxicillin, symptoms have improved. If this recurs I would recommend a 28-day treatment with Macrobid. Otherwise return as needed.  Onychodystrophy Josealberto has also now finished about 3 months of Lamisil, his toenails are starting to grow out in a normal fashion, he will do an additional 3 months before discontinuing.    ___________________________________________ Gwen Her. Dianah Field, M.D., ABFM., CAQSM. Primary Care and [place] Instructor of [employer] [facility] [employer]

## 2020-11-22 NOTE — Assessment & Plan Note (Addendum)
Trayton returns, he is a pleasant 75 year old male, he had several episodes of prostatitis over the past several months, multiple bacterial organisms have grown out and urine cultures including Staph epidermidis, Enterococcus faecalis, and E. coli, looking at the sensitivities, they have all been sensitive to [facility], he was recently treated in the urgent care with amoxicillin, symptoms have improved. If this recurs I would recommend a 28-day treatment with Macrobid. Otherwise return as needed.

## 2020-12-26 DIAGNOSIS — N401 Enlarged prostate with lower urinary tract symptoms: Secondary | ICD-10-CM | POA: Diagnosis not present

## 2020-12-26 DIAGNOSIS — R3914 Feeling of incomplete bladder emptying: Secondary | ICD-10-CM | POA: Diagnosis not present

## 2020-12-26 DIAGNOSIS — N281 Cyst of kidney, acquired: Secondary | ICD-10-CM | POA: Diagnosis not present

## 2020-12-26 DIAGNOSIS — N390 Urinary tract infection, site not specified: Secondary | ICD-10-CM | POA: Diagnosis not present

## 2020-12-26 DIAGNOSIS — R338 Other retention of urine: Secondary | ICD-10-CM | POA: Diagnosis not present

## 2020-12-26 DIAGNOSIS — Z87891 Personal history of nicotine dependence: Secondary | ICD-10-CM | POA: Insufficient documentation

## 2020-12-27 DIAGNOSIS — N390 Urinary tract infection, site not specified: Secondary | ICD-10-CM | POA: Diagnosis not present

## 2020-12-27 DIAGNOSIS — R3914 Feeling of incomplete bladder emptying: Secondary | ICD-10-CM | POA: Diagnosis not present

## 2020-12-27 DIAGNOSIS — N401 Enlarged prostate with lower urinary tract symptoms: Secondary | ICD-10-CM | POA: Diagnosis not present

## 2020-12-27 DIAGNOSIS — N281 Cyst of kidney, acquired: Secondary | ICD-10-CM | POA: Diagnosis not present

## 2021-02-25 ENCOUNTER — Inpatient Hospital Stay: Payer: Medicare Other | Attending: Hematology & Oncology | Admitting: Hematology & Oncology

## 2021-02-25 ENCOUNTER — Encounter: Payer: Self-pay | Admitting: Sports Medicine

## 2021-02-25 ENCOUNTER — Encounter: Payer: Self-pay | Admitting: Hematology & Oncology

## 2021-02-25 ENCOUNTER — Inpatient Hospital Stay: Payer: Medicare Other

## 2021-02-25 ENCOUNTER — Other Ambulatory Visit: Payer: Self-pay

## 2021-02-25 DIAGNOSIS — Z79899 Other long term (current) drug therapy: Secondary | ICD-10-CM | POA: Diagnosis not present

## 2021-02-25 DIAGNOSIS — C9110 Chronic lymphocytic leukemia of B-cell type not having achieved remission: Secondary | ICD-10-CM | POA: Insufficient documentation

## 2021-02-25 DIAGNOSIS — D696 Thrombocytopenia, unspecified: Secondary | ICD-10-CM | POA: Diagnosis not present

## 2021-02-25 LAB — CMP (CANCER CENTER ONLY)
ALT: 14 U/L (ref 0–44)
AST: 15 U/L (ref 15–41)
Albumin: 4.3 g/dL (ref 3.5–5.0)
Alkaline Phosphatase: 74 U/L (ref 38–126)
Anion gap: 5 (ref 5–15)
BUN: 13 mg/dL (ref 8–23)
CO2: 32 mmol/L (ref 22–32)
Calcium: 9.1 mg/dL (ref 8.9–10.3)
Chloride: 104 mmol/L (ref 98–111)
Creatinine: 1.2 mg/dL (ref 0.61–1.24)
GFR, Estimated: >60 mL/min (ref >60)
Glucose, Bld: 92 mg/dL (ref 70–99)
Potassium: 4.8 mmol/L (ref 3.5–5.1)
Sodium: 141 mmol/L (ref 135–145)
Total Bilirubin: 0.7 mg/dL (ref 0.3–1.2)
Total Protein: 6.8 g/dL (ref 6.5–8.1)

## 2021-02-25 LAB — SAVE SMEAR(SSMR), FOR PROVIDER SLIDE REVIEW

## 2021-02-25 LAB — CBC WITH DIFFERENTIAL (CANCER CENTER ONLY)
Abs Immature Granulocytes: 0.03 10*3/uL (ref 0.00–0.07)
Basophils Absolute: 0.1 10*3/uL (ref 0.0–0.1)
Basophils Relative: 0 %
Eosinophils Absolute: 0.2 10*3/uL (ref 0.0–0.5)
Eosinophils Relative: 1 %
HCT: 45.2 % (ref 39.0–52.0)
Hemoglobin: 14.7 g/dL (ref 13.0–17.0)
Immature Granulocytes: 0 %
Lymphocytes Relative: 80 %
Lymphs Abs: 20.6 10*3/uL — ABNORMAL HIGH (ref 0.7–4.0)
MCH: 31.3 pg (ref 26.0–34.0)
MCHC: 32.5 g/dL (ref 30.0–36.0)
MCV: 96.2 fL (ref 80.0–100.0)
Monocytes Absolute: 2.2 10*3/uL — ABNORMAL HIGH (ref 0.1–1.0)
Monocytes Relative: 8 %
Neutro Abs: 2.8 10*3/uL (ref 1.7–7.7)
Neutrophils Relative %: 11 %
Platelet Count: 121 10*3/uL — ABNORMAL LOW (ref 150–400)
RBC: 4.7 MIL/uL (ref 4.22–5.81)
RDW: 13.4 % (ref 11.5–15.5)
WBC Count: 25.9 10*3/uL — ABNORMAL HIGH (ref 4.0–10.5)
nRBC: 0 % (ref 0.0–0.2)

## 2021-02-25 LAB — LACTATE DEHYDROGENASE: LDH: 173 U/L (ref 98–192)

## 2021-02-25 NOTE — Progress Notes (Signed)
Hematology and Oncology Follow Up Visit  Jason Abbott [id number] 17-Sep-1945 76 y.o. 02/25/2021   Principle Diagnosis:  Stage A CLL  Current Therapy:   Observation    Interim History:  Jason Abbott is here today for follow-up.  We saw him 6 months ago.  He actually is doing very nicely.  He has been playing his accordion.  He had a nice event on Valentine's Day.  He comes in with his wife.  They will be going out to [place] state.  They will be driving.  I think they are going in May.  He has had no problem with infections.  He does have problems with urinary tract issue.  I think this is more a urological problem.  He has had no problems with COVID.  He has had no cough or shortness of breath.  He has had no change in bowel or bladder habits.  There is been no problems with leg swelling.  He has had no headache.  Overall, I would say his performance status is ECOG 1.      Medications:  Allergies as of 02/25/2021   No Known Allergies      Medication List        Accurate as of February 25, 2021  1:01 PM. If you have any questions, ask your nurse or doctor.          magnesium oxide 400 (240 Mg) MG tablet Commonly known as: MAGnesium-Oxide Take 1 tablet (400 mg total) by mouth daily.   multivitamin tablet Take 1 tablet by mouth daily.   tamsulosin 0.4 MG Caps capsule Commonly known as: FLOMAX Take 0.4 mg by mouth daily.   terbinafine 250 MG tablet Commonly known as: LAMISIL Take 250 mg by mouth daily.        Allergies: No Known Allergies  Past Medical History, Surgical history, Social history, and Family History were reviewed and updated.  Review of Systems: Review of Systems  Constitutional: Negative.   HENT: Negative.    Eyes: Negative.   Respiratory: Negative.    Cardiovascular: Negative.   Gastrointestinal: Negative.   Genitourinary: Negative.   Musculoskeletal: Negative.   Skin: Negative.   Neurological: Negative.   Endo/Heme/Allergies:  Negative.   Psychiatric/Behavioral: Negative.      Physical Exam:  vitals were not taken for this visit.   Wt Readings from Last 3 Encounters:  11/22/20 199 lb (90.3 kg)  10/23/20 202 lb 8 oz (91.9 kg)  10/14/20 195 lb (88.5 kg)    Physical Exam Vitals reviewed.  HENT:     Head: Normocephalic and atraumatic.  Eyes:     Pupils: Pupils are equal, round, and reactive to light.  Cardiovascular:     Rate and Rhythm: Normal rate and regular rhythm.     Heart sounds: Normal heart sounds.  Pulmonary:     Effort: Pulmonary effort is normal.     Breath sounds: Normal breath sounds.  Abdominal:     General: Bowel sounds are normal.     Palpations: Abdomen is soft.  Musculoskeletal:        General: No tenderness or deformity. Normal range of motion.     Cervical back: Normal range of motion.  Lymphadenopathy:     Cervical: No cervical adenopathy.  Skin:    General: Skin is warm and dry.     Findings: No erythema or rash.  Neurological:     Mental Status: He is alert and oriented to person, place, and time.  Psychiatric:        Behavior: Behavior normal.        Thought Content: Thought content normal.        Judgment: Judgment normal.     Lab Results  Component Value Date   WBC 25.9 (H) 02/25/2021   HGB 14.7 02/25/2021   HCT 45.2 02/25/2021   MCV 96.2 02/25/2021   PLT 121 (L) 02/25/2021   No results found for: FERRITIN, IRON, TIBC, UIBC, IRONPCTSAT Lab Results  Component Value Date   RBC 4.70 02/25/2021   No results found for: KPAFRELGTCHN, LAMBDASER, KAPLAMBRATIO No results found for: IGGSERUM, IGA, IGMSERUM No results found for: Odetta Pink, SPEI   Chemistry      Component Value Date/Time   NA 141 02/25/2021 1201   K 4.8 02/25/2021 1201   CL 104 02/25/2021 1201   CO2 32 02/25/2021 1201   BUN 13 02/25/2021 1201   CREATININE 1.20 02/25/2021 1201   CREATININE 1.08 09/11/2020 0000      Component Value  Date/Time   CALCIUM 9.1 02/25/2021 1201   ALKPHOS 74 02/25/2021 1201   AST 15 02/25/2021 1201   ALT 14 02/25/2021 1201   BILITOT 0.7 02/25/2021 1201       Impression and Plan: Jason Abbott is a very pleasant 76 yo caucasian gentleman with stage A CLL.  Everything is looking pretty stable.  He is not anemic.  His thrombocytopenia is not worse.  I really think that he has very stable disease.  I do not see that we have to do any intervention for this.  I looked at his blood smear.  He does have a predominance of lymphocytes which appear mature.  I would like to get him back in 6 more months.  I did this would be very reasonable.     Volanda Napoleon, MD 2/20/20231:01 PM

## 2021-02-26 ENCOUNTER — Other Ambulatory Visit: Payer: Self-pay | Admitting: Sports Medicine

## 2021-02-26 DIAGNOSIS — L603 Nail dystrophy: Secondary | ICD-10-CM

## 2021-02-26 LAB — IGG, IGA, IGM
IgA: 156 mg/dL (ref 61–437)
IgG (Immunoglobin G), Serum: 997 mg/dL (ref 603–1613)
IgM (Immunoglobulin M), Srm: 45 mg/dL (ref 15–143)

## 2021-02-26 LAB — BETA 2 MICROGLOBULIN, SERUM: Beta-2 Microglobulin: 2.9 mg/L — ABNORMAL HIGH (ref 0.6–2.4)

## 2021-02-27 MED ORDER — TERBINAFINE HCL 250 MG PO TABS: 250.0000 mg | ORAL_TABLET | Freq: Every day | ORAL | 0 refills | Status: DC

## 2021-03-11 ENCOUNTER — Ambulatory Visit (INDEPENDENT_AMBULATORY_CARE_PROVIDER_SITE_OTHER): Payer: Medicare Other

## 2021-03-11 ENCOUNTER — Other Ambulatory Visit: Payer: Self-pay

## 2021-03-11 ENCOUNTER — Ambulatory Visit (INDEPENDENT_AMBULATORY_CARE_PROVIDER_SITE_OTHER): Payer: Medicare Other | Admitting: Sports Medicine

## 2021-03-11 ENCOUNTER — Encounter: Payer: Self-pay | Admitting: Sports Medicine

## 2021-03-11 DIAGNOSIS — L603 Nail dystrophy: Secondary | ICD-10-CM

## 2021-03-11 DIAGNOSIS — H9319 Tinnitus, unspecified ear: Secondary | ICD-10-CM | POA: Insufficient documentation

## 2021-03-11 DIAGNOSIS — R200 Anesthesia of skin: Secondary | ICD-10-CM

## 2021-03-11 DIAGNOSIS — H9313 Tinnitus, bilateral: Secondary | ICD-10-CM | POA: Diagnosis not present

## 2021-03-11 DIAGNOSIS — R202 Paresthesia of skin: Secondary | ICD-10-CM | POA: Diagnosis not present

## 2021-03-11 DIAGNOSIS — M79641 Pain in right hand: Secondary | ICD-10-CM

## 2021-03-11 MED ORDER — CELECOXIB 200 MG PO CAPS: ORAL_CAPSULE | ORAL | 2 refills | Status: DC

## 2021-03-11 NOTE — Assessment & Plan Note (Signed)
Dramatic improvement after about 6 months of Lamisil, he can discontinue after he finishes his current bottle. ?

## 2021-03-11 NOTE — Assessment & Plan Note (Signed)
This is a pleasant 76 year old male, he has occasional numbness and tingling left hand fourth and fifth fingers consistent with a cubital tunnel/ulnar neuropathy. ?He has had a ulnar nerve transposition as well as carpal tunnel release on the left side, considering persistent paresthesias he likely has either a radicular component of discomfort or compression of the canal of [place]yon. ?On further discussion he tells me he can live with it, if he changes his mind we will proceed with additional nerve conduction and EMG. ?

## 2021-03-11 NOTE — Progress Notes (Signed)
? ? ?  Procedures performed today:   ? ?None. ? ?Independent interpretation of notes and tests performed by another provider:  ? ?None. ? ?Brief History, Exam, Impression, and Recommendations:   ? ?Numbness and tingling in left hand ?This is a pleasant 76 year old male, he has occasional numbness and tingling left hand fourth and fifth fingers consistent with a cubital tunnel/ulnar neuropathy. ?He has had a ulnar nerve transposition as well as carpal tunnel release on the left side, considering persistent paresthesias he likely has either a radicular component of discomfort or compression of the canal of [place]. ?On further discussion he tells me he can live with it, if he changes his mind we will proceed with additional nerve conduction and EMG. ? ?Right hand pain ?Chronic right hand pain, no overt numbness and tingling, pain at the MCPs, wrist joint. ?Suspect osteoarthritis. ?Adding x-rays, Celebrex. ?He is requesting referral to rheumatology so I am going to pull the trigger though I think this is more degenerative rather than autoimmune. ? ?Onychodystrophy ?Dramatic improvement after about 6 months of Lamisil, he can discontinue after he finishes his current bottle. ? ?Tinnitus ?Mild tinnitus, ear exam normal, hearing grossly normal, explained that this was for the most part impossible to treat, he feels as though he can live with it for now. ? ? ? ?___________________________________________ ?Gwen Her. Dianah Field, M.D., ABFM., CAQSM. ?Primary Care and Sports Medicine ?[place] ? ?Adjunct Instructor of Family Medicine  ?[employer] ?

## 2021-03-11 NOTE — Assessment & Plan Note (Signed)
Chronic right hand pain, no overt numbness and tingling, pain at the MCPs, wrist joint. ?Suspect osteoarthritis. ?Adding x-rays, Celebrex. ?He is requesting referral to rheumatology so I am going to pull the trigger though I think this is more degenerative rather than autoimmune. ?

## 2021-03-11 NOTE — Assessment & Plan Note (Signed)
Mild tinnitus, ear exam normal, hearing grossly normal, explained that this was for the most part impossible to treat, he feels as though he can live with it for now. ?

## 2021-04-15 ENCOUNTER — Ambulatory Visit (INDEPENDENT_AMBULATORY_CARE_PROVIDER_SITE_OTHER): Payer: Medicare Other | Admitting: Sports Medicine

## 2021-04-15 DIAGNOSIS — R1013 Epigastric pain: Secondary | ICD-10-CM

## 2021-04-15 DIAGNOSIS — R202 Paresthesia of skin: Secondary | ICD-10-CM | POA: Diagnosis not present

## 2021-04-15 DIAGNOSIS — R200 Anesthesia of skin: Secondary | ICD-10-CM | POA: Diagnosis not present

## 2021-04-15 MED ORDER — PANTOPRAZOLE SODIUM 40 MG PO TBEC: 40.0000 mg | DELAYED_RELEASE_TABLET | Freq: Every day | ORAL | 3 refills | Status: DC

## 2021-04-15 MED ORDER — AMOXICILL-CLARITHRO-LANSOPRAZ PO MISC: Freq: Two times a day (BID) | ORAL | 0 refills | Status: DC

## 2021-04-15 NOTE — Progress Notes (Signed)
? ? ?  Procedures performed today:   ? ?None. ? ?Independent interpretation of notes and tests performed by another provider:  ? ?None. ? ?Brief History, Exam, Impression, and Recommendations:   ? ?Numbness and tingling in both hands ?Pleasant 76 year old male, occasional bilateral numbness and tingling, left hand fourth and fifth fingers consistent with ulnar neuropathy. ?He did have an ulnar nerve transposition as well as a carpal tunnel release on the left side, considering persistent paresthesias we may be missing a radiculopathy or compression of the canal of [place]yon. ?He is having similar symptoms contralaterally, we will proceed now with bilateral upper extremity nerve conduction and EMG for further delineation. ?Celebrex was ineffective. ? ?Dyspepsia ?History of H. pylori gastritis that resolved with Prevpac last year, he is having recurrence of symptoms, he has been off of his pantoprazole. ?Last year we did a referral to Dr. Bryan Lemma, I am going to go ahead and empirically treat him again with Prevpac. ?He then needs to do pantoprazole daily. ? ? ? ?___________________________________________ ?Gwen Her. Dianah Field, M.D., ABFM., CAQSM. ?Primary Care and Sports Medicine ?[place] ? ?Adjunct Instructor of Family Medicine  ?[employer] ?

## 2021-04-15 NOTE — Assessment & Plan Note (Signed)
Pleasant 76 year old male, occasional bilateral numbness and tingling, left hand fourth and fifth fingers consistent with ulnar neuropathy. ?He did have an ulnar nerve transposition as well as a carpal tunnel release on the left side, considering persistent paresthesias we may be missing a radiculopathy or compression of the canal of [place]. ?He is having similar symptoms contralaterally, we will proceed now with bilateral upper extremity nerve conduction and EMG for further delineation. ?Celebrex was ineffective. ?

## 2021-04-15 NOTE — Assessment & Plan Note (Signed)
History of H. pylori gastritis that resolved with Prevpac last year, he is having recurrence of symptoms, he has been off of his pantoprazole. ?Last year we did a referral to Dr. Bryan Lemma, I am going to go ahead and empirically treat him again with Prevpac. ?He then needs to do pantoprazole daily. ?

## 2021-05-14 DIAGNOSIS — H903 Sensorineural hearing loss, bilateral: Secondary | ICD-10-CM | POA: Diagnosis not present

## 2021-05-14 DIAGNOSIS — H9313 Tinnitus, bilateral: Secondary | ICD-10-CM | POA: Diagnosis not present

## 2021-05-14 DIAGNOSIS — H838X3 Other specified diseases of inner ear, bilateral: Secondary | ICD-10-CM | POA: Diagnosis not present

## 2021-07-12 NOTE — Progress Notes (Signed)
Office Visit Note  Patient: Jason Abbott.             Date of Birth: 03/26/1945           MRN: [id number]             PCP: Silverio Decamp, MD Referring: Silverio Decamp,* Visit Date: 07/23/2021 Occupation: '@GUAROCC'$ @  Subjective:  New Patient (Initial Visit) (Bil hand joint pain)   History of Present Illness: Jason Puls. is a 76 y.o. male consultation per request of Dr. Dianah Field.  According to the patient he has had pain and discomfort in his bilateral hands for the last 6 months.  He states about 3 to 4 years ago he had left ulnar neuropathy and had a surgery by Dr. Amedeo Plenty.  He had left ulnar nerve transposition and left carpal tunnel release by Dr. Amedeo Plenty.  However, he did not notice any improvement in the numbness of his left fourth and fifth finger.  He has been having pain and stiffness in his bilateral hands especially his right hand.  He plays accordion and that is becoming difficult for him with the right hand.  He also has history of right rotator cuff tear.  He has been evaluated by Dr. Dianah Field in the past.  He was told that he had torn supraspinatus.  He never had surgery for it.  None of the other joints are painful.  He denies any joint swelling.  There is no family history of autoimmune disease.  There is no personal or family history of psoriasis.  Activities of Daily Living:  Patient reports morning stiffness for 30 minutes.   Patient Reports nocturnal pain.  Difficulty dressing/grooming: Reports Difficulty climbing stairs: Denies Difficulty getting out of chair: Denies Difficulty using hands for taps, buttons, cutlery, and/or writing: Reports  Review of Systems  Constitutional:  Negative for fatigue.  HENT:  Negative for mouth dryness.   Eyes:  Negative for dryness.  Respiratory:  Positive for shortness of breath.   Cardiovascular:  Negative for swelling in legs/feet.  Gastrointestinal:  Negative for constipation.  Endocrine:  Positive for cold intolerance and increased urination.  Genitourinary:  Negative for difficulty urinating.  Musculoskeletal:  Positive for joint pain, joint pain and morning stiffness.  Skin:  Negative for rash.  Allergic/Immunologic: Negative for susceptible to infections.  Neurological:  Positive for numbness, headaches and weakness.  Hematological:  Positive for bruising/bleeding tendency.  Psychiatric/Behavioral:  Negative for sleep disturbance.     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/27/2019   Leukocytosis 01/23/2019   Lumbar spinal stenosis 12/21/2018   Numbness and tingling in both hands 10/20/2018   Hyperlipidemia, mixed 06/01/2018   Seborrheic keratosis 01/12/2018   Annual physical exam 04/26/2015   Prostatitis with Urosepsis 05/11/2014   Bacterial infection due to Klebsiella pneumoniae 04/20/2014   Pyelonephritis 04/17/2014   Combined forms of age-related cataract of left eye 03/23/2014   Regular astigmatism of left eye 03/23/2014   Dyspepsia 02/23/2014   Shingles outbreak 10/03/2013   Right shoulder pain 10/03/2013    Past Medical History:  Diagnosis Date   Bowel obstruction ([place])    Diverticul disease small  and large intestine, no perforati or abscess    H/O urinary infection    Stroke [facility]) 01/2019    Family History  Problem Relation Age of Onset   Cancer Mother        pancreatic   COPD Father    Diabetes Sister    Diabetes Brother    Diabetes Brother    Past Surgical History:  Procedure Laterality Date   HERNIA REPAIR     MECKEL DIVERTICULUM EXCISION     ORIF FINGER / THUMB FRACTURE     Social History   Social History Narrative    lives with wife   Caffeine- coffee 1-2 daily   Immunization History  Administered Date(s) Administered   Influenza-Unspecified 10/25/2015   Pneumococcal Polysaccharide-23 01/19/2012   Tdap 01/07/2015     Objective: Vital Signs: BP (!) 144/87 (BP Location: Right Arm, Patient Position: Sitting, Cuff Size: Normal)   Pulse 66   Resp 17   Ht 5' 10.75" (1.797 m)   Wt 189 lb (85.7 kg)   BMI 26.55 kg/m    Physical Exam Vitals and nursing note reviewed.  Constitutional:      Appearance: He is well-developed.  HENT:     Head: Normocephalic and atraumatic.  Eyes:     Conjunctiva/sclera: Conjunctivae normal.     Pupils: Pupils are equal, round, and reactive to light.  Cardiovascular:     Rate and Rhythm: Normal rate and regular rhythm.     Heart sounds: Normal heart sounds.  Pulmonary:     Effort: Pulmonary effort is normal.     Breath sounds: Normal breath sounds.  Abdominal:     General: Bowel sounds are normal.     Palpations: Abdomen is soft.  Musculoskeletal:     Cervical back: Normal range of motion and neck supple.  Skin:    General: Skin is warm and dry.     Capillary Refill: Capillary refill takes less than 2 seconds.  Neurological:     Mental Status: He is alert and oriented to person, place, and time.  Psychiatric:        Behavior: Behavior normal.      Musculoskeletal Exam: C-spine was in good range of motion.  He had good range of motion of his lumbar spine with some scoliosis.  Shoulder joints were in good range of motion.  He he had a mild contracture in his right elbow joint without any synovitis.  Wrist joints with good range of motion.  He had bilateral PIP and DIP thickening with no synovitis.  He had thickening of left fourth and fifth flexor tendon and right third flexor tendon.  Hip joints and knee joints with good range of motion.  He had no tenderness over ankles or MTPs.  Bilateral hammertoes were noted.  CDAI Exam: CDAI Score: -- Patient Global: --;  Provider Global: -- Swollen: --; Tender: -- Joint Exam 07/23/2021   No joint exam has been documented for this visit   There is currently no information documented on the homunculus. Go to the Rheumatology activity and complete the homunculus joint exam.  Investigation: No additional findings.  Imaging: No results found.  Recent Labs: Lab Results  Component Value Date   WBC 25.9 (H) 02/25/2021   HGB 14.7 02/25/2021   PLT 121 (L) 02/25/2021   NA 141 02/25/2021   K 4.8 02/25/2021   CL 104 02/25/2021   CO2 32 02/25/2021   GLUCOSE 92 02/25/2021   BUN 13 02/25/2021   CREATININE  1.20 02/25/2021   BILITOT 0.7 02/25/2021   ALKPHOS 74 02/25/2021   AST 15 02/25/2021   ALT 14 02/25/2021   PROT 6.8 02/25/2021   ALBUMIN 4.3 02/25/2021   CALCIUM 9.1 02/25/2021   GFRAA >60 08/22/2019    Speciality Comments: No specialty comments available.  Procedures:  No procedures performed Allergies: Patient has no known allergies.   Assessment / Plan:     Visit Diagnoses: Pain in both hands -he complains of pain and discomfort in his bilateral hands.  The symptoms are more prominent in the right hand than the left hand.  He denies any paresthesias in his right hand.  He states he continues to have some numbness in his left fourth and fifth finger despite having ulnar nerve release by Jason Abbott a few years back.  He has seen Dr. Dianah Field in the past.  He had x-rays of his right hand in March 2023 which were reviewed.  XR 03/11/21:  likely remote healed fracture of 5th metacarpal.  OA of the thumb carpometacarpal joint, thumb metacarpophalangeal joint, and interphalangeal joints.  He had no synovitis on examination.  He had bilateral PIP and DIP thickening consistent with osteoarthritis.  Detailed counsel regarding osteoarthritis was provided.  X-ray findings were also reviewed with the patient.  Joint protection muscle strengthening was discussed.  He has intermittent triggering of his fingers.   Use of topical analgesic agent was discussed.  I will also refer him to physical therapy and Occupational Therapy for his hands.  He is very active and uses his hands a lot.  A handout on hand exercises was given.  Chronic right shoulder pain-patient states that he was diagnosed with rotator cuff tear in the past and did not have surgery.  He had good range of motion without discomfort.  Primary osteoarthritis of both feet-he had bilateral hammertoes.  He will benefit from metatarsal pads.  I advised him to discuss with Dr. Dianah Field.  Spinal stenosis of lumbar region without neurogenic claudication-he has chronic lower back pain with intermittent increased discomfort.  He had no point tenderness.  Scoliosis was also noted.  Core strengthening exercises were discussed.  CLL (chronic lymphocytic leukemia) (HCC)-he is followed by Dr. Marin Olp.  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  Regular astigmatism of left eye  Combined forms of age-related cataract of left eye  Hyperlipidemia, mixed  History of stroke involving cerebellum  Orders: Orders Placed This Encounter  Procedures   Ambulatory referral to Occupational Therapy   No orders of the defined types were placed in this encounter.    Follow-Up Instructions: Return if symptoms worsen or fail to improve, for Osteoarthritis.   Jason Merino, MD  Note - This record has been created using Editor, commissioning.  Chart creation errors have been sought, but may not always  have been located. Such creation errors do not reflect on  the standard of medical care.

## 2021-07-23 ENCOUNTER — Encounter: Payer: Self-pay | Admitting: Rheumatology

## 2021-07-23 ENCOUNTER — Ambulatory Visit (INDEPENDENT_AMBULATORY_CARE_PROVIDER_SITE_OTHER): Payer: Medicare Other | Admitting: Rheumatology

## 2021-07-23 VITALS — BP 144/87 | HR 66 | Resp 17 | Ht 70.75 in | Wt 189.0 lb

## 2021-07-23 DIAGNOSIS — L603 Nail dystrophy: Secondary | ICD-10-CM

## 2021-07-23 DIAGNOSIS — M48061 Spinal stenosis, lumbar region without neurogenic claudication: Secondary | ICD-10-CM

## 2021-07-23 DIAGNOSIS — N410 Acute prostatitis: Secondary | ICD-10-CM

## 2021-07-23 DIAGNOSIS — H25812 Combined forms of age-related cataract, left eye: Secondary | ICD-10-CM

## 2021-07-23 DIAGNOSIS — H52222 Regular astigmatism, left eye: Secondary | ICD-10-CM

## 2021-07-23 DIAGNOSIS — M19071 Primary osteoarthritis, right ankle and foot: Secondary | ICD-10-CM | POA: Diagnosis not present

## 2021-07-23 DIAGNOSIS — G8929 Other chronic pain: Secondary | ICD-10-CM

## 2021-07-23 DIAGNOSIS — M19072 Primary osteoarthritis, left ankle and foot: Secondary | ICD-10-CM

## 2021-07-23 DIAGNOSIS — M79642 Pain in left hand: Secondary | ICD-10-CM

## 2021-07-23 DIAGNOSIS — C9110 Chronic lymphocytic leukemia of B-cell type not having achieved remission: Secondary | ICD-10-CM

## 2021-07-23 DIAGNOSIS — M4156 Other secondary scoliosis, lumbar region: Secondary | ICD-10-CM

## 2021-07-23 DIAGNOSIS — M25511 Pain in right shoulder: Secondary | ICD-10-CM

## 2021-07-23 DIAGNOSIS — Z8673 Personal history of transient ischemic attack (TIA), and cerebral infarction without residual deficits: Secondary | ICD-10-CM

## 2021-07-23 DIAGNOSIS — M79641 Pain in right hand: Secondary | ICD-10-CM | POA: Diagnosis not present

## 2021-07-23 DIAGNOSIS — E782 Mixed hyperlipidemia: Secondary | ICD-10-CM

## 2021-07-23 DIAGNOSIS — L821 Other seborrheic keratosis: Secondary | ICD-10-CM

## 2021-07-23 NOTE — Patient Instructions (Addendum)
Hand Exercises Hand exercises can be helpful for almost anyone. These exercises can strengthen the hands, improve flexibility and movement, and increase blood flow to the hands. These results can make work and daily tasks easier. Hand exercises can be especially helpful for people who have joint pain from arthritis or have nerve damage from overuse (carpal tunnel syndrome). These exercises can also help people who have injured a hand. Exercises Most of these hand exercises are gentle stretching and motion exercises. It is usually safe to do them often throughout the day. Warming up your hands before exercise may help to reduce stiffness. You can do this with gentle massage or by placing your hands in warm water for 10-15 minutes. It is normal to feel some stretching, pulling, tightness, or mild discomfort as you begin new exercises. This will gradually improve. Stop an exercise right away if you feel sudden, severe pain or your pain gets worse. Ask your health care provider which exercises are best for you. Knuckle bend or "claw" fist  Stand or sit with your arm, hand, and all five fingers pointed straight up. Make sure to keep your wrist straight during the exercise. Gently bend your fingers down toward your palm until the tips of your fingers are touching the top of your palm. Keep your big knuckle straight and just bend the small knuckles in your fingers. Hold this position for __________ seconds. Straighten (extend) your fingers back to the starting position. Repeat this exercise 5-10 times with each hand. Full finger fist  Stand or sit with your arm, hand, and all five fingers pointed straight up. Make sure to keep your wrist straight during the exercise. Gently bend your fingers into your palm until the tips of your fingers are touching the middle of your palm. Hold this position for __________ seconds. Extend your fingers back to the starting position, stretching every joint fully. Repeat  this exercise 5-10 times with each hand. Straight fist Stand or sit with your arm, hand, and all five fingers pointed straight up. Make sure to keep your wrist straight during the exercise. Gently bend your fingers at the big knuckle, where your fingers meet your hand, and the middle knuckle. Keep the knuckle at the tips of your fingers straight and try to touch the bottom of your palm. Hold this position for __________ seconds. Extend your fingers back to the starting position, stretching every joint fully. Repeat this exercise 5-10 times with each hand. Tabletop  Stand or sit with your arm, hand, and all five fingers pointed straight up. Make sure to keep your wrist straight during the exercise. Gently bend your fingers at the big knuckle, where your fingers meet your hand, as far down as you can while keeping the small knuckles in your fingers straight. Think of forming a tabletop with your fingers. Hold this position for __________ seconds. Extend your fingers back to the starting position, stretching every joint fully. Repeat this exercise 5-10 times with each hand. Finger spread  Place your hand flat on a table with your palm facing down. Make sure your wrist stays straight as you do this exercise. Spread your fingers and thumb apart from each other as far as you can until you feel a gentle stretch. Hold this position for __________ seconds. Bring your fingers and thumb tight together again. Hold this position for __________ seconds. Repeat this exercise 5-10 times with each hand. Making circles  Stand or sit with your arm, hand, and all five fingers pointed   straight up. Make sure to keep your wrist straight during the exercise. Make a circle by touching the tip of your thumb to the tip of your index finger. Hold for __________ seconds. Then open your hand wide. Repeat this motion with your thumb and each finger on your hand. Repeat this exercise 5-10 times with each hand. Thumb  motion  Sit with your forearm resting on a table and your wrist straight. Your thumb should be facing up toward the ceiling. Keep your fingers relaxed as you move your thumb. Lift your thumb up as high as you can toward the ceiling. Hold for __________ seconds. Bend your thumb across your palm as far as you can, reaching the tip of your thumb for the small finger (pinkie) side of your palm. Hold for __________ seconds. Repeat this exercise 5-10 times with each hand. Grip strengthening  Hold a stress ball or other soft ball in the middle of your hand. Slowly increase the pressure, squeezing the ball as much as you can without causing pain. Think of bringing the tips of your fingers into the middle of your palm. All of your finger joints should bend when doing this exercise. Hold your squeeze for __________ seconds, then relax. Repeat this exercise 5-10 times with each hand. Contact a health care provider if: Your hand pain or discomfort gets much worse when you do an exercise. Your hand pain or discomfort does not improve within 2 hours after you exercise. If you have any of these problems, stop doing these exercises right away. Do not do them again unless your health care provider says that you can. Get help right away if: You develop sudden, severe hand pain or swelling. If this happens, stop doing these exercises right away. Do not do them again unless your health care provider says that you can. This information is not intended to replace advice given to you by your health care provider. Make sure you discuss any questions you have with your health care provider. Document Revised: 04/12/2020 Document Reviewed: 04/12/2020 Elsevier Patient Education  2023 Else[employer] Inc.  Back Exercises The following exercises strengthen the muscles that help to support the trunk (torso) and back. They also help to keep the lower back flexible. Doing these exercises can help to prevent or lessen existing low  back pain. If you have back pain or discomfort, try doing these exercises 2-3 times each day or as told by your health care provider. As your pain improves, do them once each day, but increase the number of times that you repeat the steps for each exercise (do more repetitions). To prevent the recurrence of back pain, continue to do these exercises once each day or as told by your health care provider. Do exercises exactly as told by your health care provider and adjust them as directed. It is normal to feel mild stretching, pulling, tightness, or discomfort as you do these exercises, but you should stop right away if you feel sudden pain or your pain gets worse. Exercises Single knee to chest Repeat these steps 3-5 times for each leg: Lie on your back on a firm bed or the floor with your legs extended. Bring one knee to your chest. Your other leg should stay extended and in contact with the floor. Hold your knee in place by grabbing your knee or thigh with both hands and hold. Pull on your knee until you feel a gentle stretch in your lower back or buttocks. Hold the stretch for 10-30   seconds. Slowly release and straighten your leg.  Pelvic tilt Repeat these steps 5-10 times: Lie on your back on a firm bed or the floor with your legs extended. Bend your knees so they are pointing toward the ceiling and your feet are flat on the floor. Tighten your lower abdominal muscles to press your lower back against the floor. This motion will tilt your pelvis so your tailbone points up toward the ceiling instead of pointing to your feet or the floor. With gentle tension and even breathing, hold this position for 5-10 seconds.  Cat-cow Repeat these steps until your lower back becomes more flexible: Get into a hands-and-knees position on a firm bed or the floor. Keep your hands under your shoulders, and keep your knees under your hips. You may place padding under your knees for comfort. Let your head hang  down toward your chest. Contract your abdominal muscles and point your tailbone toward the floor so your lower back becomes rounded like the back of a cat. Hold this position for 5 seconds. Slowly lift your head, let your abdominal muscles relax, and point your tailbone up toward the ceiling so your back forms a sagging arch like the back of a cow. Hold this position for 5 seconds.  Press-ups Repeat these steps 5-10 times: Lie on your abdomen (face-down) on a firm bed or the floor. Place your palms near your head, about shoulder-width apart. Keeping your back as relaxed as possible and keeping your hips on the floor, slowly straighten your arms to raise the top half of your body and lift your shoulders. Do not use your back muscles to raise your upper torso. You may adjust the placement of your hands to make yourself more comfortable. Hold this position for 5 seconds while you keep your back relaxed. Slowly return to lying flat on the floor.  Bridges Repeat these steps 10 times: Lie on your back on a firm bed or the floor. Bend your knees so they are pointing toward the ceiling and your feet are flat on the floor. Your arms should be flat at your sides, next to your body. Tighten your buttocks muscles and lift your buttocks off the floor until your waist is at almost the same height as your knees. You should feel the muscles working in your buttocks and the back of your thighs. If you do not feel these muscles, slide your feet 1-2 inches (2.5-5 cm) farther away from your buttocks. Hold this position for 3-5 seconds. Slowly lower your hips to the starting position, and allow your buttocks muscles to relax completely. If this exercise is too easy, try doing it with your arms crossed over your chest. Abdominal crunches Repeat these steps 5-10 times: Lie on your back on a firm bed or the floor with your legs extended. Bend your knees so they are pointing toward the ceiling and your feet are flat  on the floor. Cross your arms over your chest. Tip your chin slightly toward your chest without bending your neck. Tighten your abdominal muscles and slowly raise your torso high enough to lift your shoulder blades a tiny bit off the floor. Avoid raising your torso higher than that because it can put too much stress on your lower back and does not help to strengthen your abdominal muscles. Slowly return to your starting position.  Back lifts Repeat these steps 5-10 times: Lie on your abdomen (face-down) with your arms at your sides, and rest your forehead on the floor.   Tighten the muscles in your legs and your buttocks. Slowly lift your chest off the floor while you keep your hips pressed to the floor. Keep the back of your head in line with the curve in your back. Your eyes should be looking at the floor. Hold this position for 3-5 seconds. Slowly return to your starting position.  Contact a health care provider if: Your back pain or discomfort gets much worse when you do an exercise. Your worsening back pain or discomfort does not lessen within 2 hours after you exercise. If you have any of these problems, stop doing these exercises right away. Do not do them again unless your health care provider says that you can. Get help right away if: You develop sudden, severe back pain. If this happens, stop doing the exercises right away. Do not do them again unless your health care provider says that you can. This information is not intended to replace advice given to you by your health care provider. Make sure you discuss any questions you have with your health care provider. Document Revised: 06/19/2020 Document Reviewed: 03/07/2020 Elsevier Patient Education  2023 Elsevier Inc.  

## 2021-08-05 DIAGNOSIS — M25531 Pain in right wrist: Secondary | ICD-10-CM | POA: Diagnosis not present

## 2021-08-12 ENCOUNTER — Ambulatory Visit (INDEPENDENT_AMBULATORY_CARE_PROVIDER_SITE_OTHER): Payer: Medicare Other | Admitting: Sports Medicine

## 2021-08-12 ENCOUNTER — Ambulatory Visit (INDEPENDENT_AMBULATORY_CARE_PROVIDER_SITE_OTHER): Payer: Medicare Other

## 2021-08-12 ENCOUNTER — Encounter: Payer: Self-pay | Admitting: Sports Medicine

## 2021-08-12 DIAGNOSIS — U071 COVID-19: Secondary | ICD-10-CM

## 2021-08-12 DIAGNOSIS — R918 Other nonspecific abnormal finding of lung field: Secondary | ICD-10-CM | POA: Diagnosis not present

## 2021-08-12 DIAGNOSIS — J1282 Pneumonia due to coronavirus disease 2019: Secondary | ICD-10-CM

## 2021-08-12 DIAGNOSIS — I7122 Aneurysm of the aortic arch, without rupture: Secondary | ICD-10-CM | POA: Diagnosis not present

## 2021-08-12 DIAGNOSIS — M50322 Other cervical disc degeneration at C5-C6 level: Secondary | ICD-10-CM | POA: Diagnosis not present

## 2021-08-12 DIAGNOSIS — L600 Ingrowing nail: Secondary | ICD-10-CM | POA: Insufficient documentation

## 2021-08-12 DIAGNOSIS — M47812 Spondylosis without myelopathy or radiculopathy, cervical region: Secondary | ICD-10-CM | POA: Diagnosis not present

## 2021-08-12 DIAGNOSIS — G8929 Other chronic pain: Secondary | ICD-10-CM | POA: Diagnosis not present

## 2021-08-12 DIAGNOSIS — R519 Headache, unspecified: Secondary | ICD-10-CM

## 2021-08-12 MED ORDER — BREZTRI AEROSPHERE 160-9-4.8 MCG/ACT IN AERO: 2.0000 | INHALATION_SPRAY | Freq: Two times a day (BID) | RESPIRATORY_TRACT | 11 refills | Status: DC

## 2021-08-12 MED ORDER — AZITHROMYCIN 250 MG PO TABS: ORAL_TABLET | ORAL | 0 refills | Status: DC

## 2021-08-12 MED ORDER — PREDNISONE 50 MG PO TABS: ORAL_TABLET | ORAL | 0 refills | Status: DC

## 2021-08-12 NOTE — Progress Notes (Addendum)
    Procedures performed today:    None.  Independent interpretation of notes and tests performed by another provider:   None.  Brief History, Exam, Impression, and Recommendations:    Pneumonia due to COVID-19 virus Pleasant 76 year old male, history of COVID-pneumonia with chronic lung scarring on high-resolution CT, he historically was controlled on 5 mg of daily prednisone, Breztri. He stopped all of the above about 2 years ago. Now having worsening shortness of breath Does not really have any cough or constitutional symptoms. Poor air movement right side. Considering his history we will start aggressively with azithromycin, prednisone, restarting his inhaler, adding a chest x-ray.  Update: Chest x-ray does show increasing right lower lobe opacities consistent with pneumonia.  Ingrown right greater toenail Right great ingrown without paronychia. Once we get his lung straightened out we will do a right great lateral nail plate excision with phenol matricectomy.  Chronic headaches Chronic right-sided occipital headaches worse at night and in the morning, suspect a cervical radicular source versus facet arthritis. We will evaluate this in more detail at a follow-up, I would like cervical spine x-rays today.  Aortic arch aneurysm (HCC) Increased prominence of the aortic arch on chest concerning for worsening of thoracic aortic aneurysm, adding thoracic CT angiography for comparison to previous CTA.  Update:  Aneurysm has significantly enlarged, we need to get him back in with Dr. Darcey Nora or one of his partners at [place].    ____________________________________________ Gwen Her. Dianah Field, M.D., ABFM., CAQSM., AME. Primary Care and Sports Medicine  [facility]  Adjunct Professor of [place] [employer]  Risk manager

## 2021-08-12 NOTE — Assessment & Plan Note (Signed)
Chronic right-sided occipital headaches worse at night and in the morning, suspect a cervical radicular source versus facet arthritis. We will evaluate this in more detail at a follow-up, I would like cervical spine x-rays today.

## 2021-08-12 NOTE — Assessment & Plan Note (Signed)
Right great ingrown without paronychia. Once we get his lung straightened out we will do a right great lateral nail plate excision with phenol matricectomy.

## 2021-08-12 NOTE — Assessment & Plan Note (Addendum)
 Pleasant 76 year old male, history of COVID-pneumonia with chronic lung scarring on high-resolution CT, he historically was controlled on 5 mg of daily prednisone , Breztri . He stopped all of the above about 2 years ago. Now having worsening shortness of breath Does not really have any cough or constitutional symptoms. Poor air movement right side. Considering his history we will start aggressively with azithromycin , prednisone , restarting his inhaler, adding a chest x-ray.  Update: Chest x-ray does show increasing right lower lobe opacities consistent with pneumonia.

## 2021-08-13 ENCOUNTER — Other Ambulatory Visit: Payer: Self-pay | Admitting: Sports Medicine

## 2021-08-13 DIAGNOSIS — I7122 Aneurysm of the aortic arch, without rupture: Secondary | ICD-10-CM

## 2021-08-13 NOTE — Assessment & Plan Note (Addendum)
Increased prominence of the aortic arch on chest concerning for worsening of thoracic aortic aneurysm, adding thoracic CT angiography for comparison to previous CTA.  Update:  Aneurysm has significantly enlarged, we need to get him back in with Dr. Darcey Nora or one of his partners at [place].

## 2021-08-13 NOTE — Addendum Note (Signed)
Addended by: Silverio Decamp on: 08/13/2021 10:00 AM   Modules accepted: Orders

## 2021-08-14 ENCOUNTER — Encounter: Payer: Self-pay | Admitting: Sports Medicine

## 2021-08-15 DIAGNOSIS — I7122 Aneurysm of the aortic arch, without rupture: Secondary | ICD-10-CM | POA: Diagnosis not present

## 2021-08-16 LAB — BASIC METABOLIC PANEL WITH GFR
BUN: 18 mg/dL (ref 7–25)
CO2: 28 mmol/L (ref 20–32)
Calcium: 9.2 mg/dL (ref 8.6–10.3)
Chloride: 101 mmol/L (ref 98–110)
Creat: 1.03 mg/dL (ref 0.70–1.28)
Glucose, Bld: 118 mg/dL — ABNORMAL HIGH (ref 65–99)
Potassium: 4.6 mmol/L (ref 3.5–5.3)
Sodium: 137 mmol/L (ref 135–146)
eGFR: 75 mL/min/{1.73_m2} (ref >=60)

## 2021-08-19 ENCOUNTER — Ambulatory Visit (INDEPENDENT_AMBULATORY_CARE_PROVIDER_SITE_OTHER): Payer: Medicare Other

## 2021-08-19 DIAGNOSIS — I712 Thoracic aortic aneurysm, without rupture, unspecified: Secondary | ICD-10-CM | POA: Diagnosis not present

## 2021-08-19 DIAGNOSIS — J9811 Atelectasis: Secondary | ICD-10-CM | POA: Diagnosis not present

## 2021-08-19 DIAGNOSIS — I7122 Aneurysm of the aortic arch, without rupture: Secondary | ICD-10-CM

## 2021-08-19 MED ORDER — IOHEXOL 350 MG/ML SOLN
100.0000 mL | Freq: Once | INTRAVENOUS | Status: AC | PRN
  Administered 2021-08-19: 100 mL via INTRAVENOUS

## 2021-08-19 NOTE — Addendum Note (Signed)
Addended by: Silverio Decamp on: 08/19/2021 12:20 PM   Modules accepted: Orders

## 2021-08-21 ENCOUNTER — Encounter (INDEPENDENT_AMBULATORY_CARE_PROVIDER_SITE_OTHER): Payer: Medicare Other | Admitting: Sports Medicine

## 2021-08-21 DIAGNOSIS — U071 COVID-19: Secondary | ICD-10-CM | POA: Diagnosis not present

## 2021-08-21 DIAGNOSIS — J1282 Pneumonia due to coronavirus disease 2019: Secondary | ICD-10-CM

## 2021-08-21 MED ORDER — PREDNISONE 10 MG (48) PO TBPK: ORAL_TABLET | Freq: Every day | ORAL | 0 refills | Status: DC

## 2021-08-21 NOTE — Telephone Encounter (Signed)
I spent 5 total minutes of online digital evaluation and management services in this patient-initiated request for online care. 

## 2021-08-24 ENCOUNTER — Telehealth: Payer: Self-pay | Admitting: Emergency Medicine

## 2021-08-24 ENCOUNTER — Ambulatory Visit
Admission: RE | Admit: 2021-08-24 | Discharge: 2021-08-24 | Disposition: A | Discharge status: Home / Self Care | Payer: Medicare Other | Source: Ambulatory Visit | Attending: Family Medicine | Admitting: Family Medicine

## 2021-08-24 ENCOUNTER — Telehealth: Payer: Medicare Other | Admitting: Nurse Practitioner

## 2021-08-24 ENCOUNTER — Ambulatory Visit (INDEPENDENT_AMBULATORY_CARE_PROVIDER_SITE_OTHER): Payer: Medicare Other

## 2021-08-24 VITALS — BP 142/83 | HR 66 | Temp 98.7°F | Resp 16 | Ht 70.75 in | Wt 182.0 lb

## 2021-08-24 DIAGNOSIS — R059 Cough, unspecified: Secondary | ICD-10-CM | POA: Diagnosis not present

## 2021-08-24 DIAGNOSIS — R062 Wheezing: Secondary | ICD-10-CM | POA: Diagnosis not present

## 2021-08-24 DIAGNOSIS — J189 Pneumonia, unspecified organism: Secondary | ICD-10-CM | POA: Diagnosis not present

## 2021-08-24 DIAGNOSIS — R918 Other nonspecific abnormal finding of lung field: Secondary | ICD-10-CM | POA: Diagnosis not present

## 2021-08-24 DIAGNOSIS — R0602 Shortness of breath: Secondary | ICD-10-CM | POA: Diagnosis not present

## 2021-08-24 MED ORDER — LEVOFLOXACIN 500 MG PO TABS: 500.0000 mg | ORAL_TABLET | Freq: Every day | ORAL | 0 refills | Status: DC

## 2021-08-24 MED ORDER — DOXYCYCLINE HYCLATE 100 MG PO TABS: 100.0000 mg | ORAL_TABLET | Freq: Two times a day (BID) | ORAL | 0 refills | Status: DC

## 2021-08-24 NOTE — Telephone Encounter (Signed)
Call by this RN to see if patient could come in earlier- appt time is 3pm today. Based on pt's history, provider requested patient come in earlier for evaluation. Pt stated he had "a few errands to run prior to the appointment time. I feel fine, but my wife wants me to be checked out before my appointments next week. I will try and come in a little bit earlier" Pt verbalized an understanding that [facility] closes at 4 pm today & sometimes our appointments run behind later on in the day"

## 2021-08-24 NOTE — Discharge Instructions (Signed)
Continue to drink lots of water Take antibiotic once a day Continue prednisone Call your doctor for follow-up

## 2021-08-24 NOTE — Progress Notes (Signed)
Virtual Visit Consent   Jason Nearing., you are scheduled for a virtual visit with Mary-Margaret Hassell Done, [place], a [facility] provider, today.     Just as with appointments in the office, your consent must be obtained to participate.  Your consent will be active for this visit and any virtual visit you may have with one of our providers in the next 365 days.     If you have a MyChart account, a copy of this consent can be sent to you electronically.  All virtual visits are billed to your insurance company just like a traditional visit in the office.    As this is a virtual visit, video technology does not allow for your provider to perform a traditional examination.  This may limit your provider's ability to fully assess your condition.  If your provider identifies any concerns that need to be evaluated in person or the need to arrange testing (such as labs, EKG, etc.), we will make arrangements to do so.     Although advances in technology are sophisticated, we cannot ensure that it will always work on either your end or our end.  If the connection with a video visit is poor, the visit may have to be switched to a telephone visit.  With either a video or telephone visit, we are not always able to ensure that we have a secure connection.     I need to obtain your verbal consent now.   Are you willing to proceed with your visit today? YES   Jason Ponzo. has provided verbal consent on 08/24/2021 for a virtual visit (video or telephone).   video did not work  Midwife, [place]   Date: 08/24/2021 6:11 PM   Virtual Visit via Video Note   I, Mary-Margaret Hassell Done, connected with Jason Abbott. ([id number], 1945/11/08) on 08/24/21 at  6:15 PM EDT by a video-enabled telemedicine application and verified that I am speaking with the correct person using two identifiers.  Location: Patient: Virtual Visit Location Patient: Home Provider: Virtual Visit Location Provider:  Mobile   I discussed the limitations of evaluation and management by telemedicine and the availability of in person appointments. The patient expressed understanding and agreed to proceed.    History of Present Illness: Jason Heggie. is a 76 y.o. who identifies as a male who was assigned male at birth, and is being seen today for medication issue.  HPI: Patient went to the ED this morning and was diagnosed with CAP. He was given Levaquin. 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 The levaquin can cause heart problems. Would like a different antibiotic. He was actually first dx on 08/12/21 and was given 5 day course of zithromax. The ED says that the pneumonia has worsened    Review of Systems  Respiratory:  Positive for cough, sputum production and shortness of breath.   Cardiovascular:  Negative for palpitations.    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   Leukocytosis 01/23/2019   Lumbar spinal stenosis 12/21/2018  Numbness and tingling in both hands 10/20/2018   Hyperlipidemia, mixed 06/01/2018   Seborrheic keratosis 01/12/2018   Annual physical exam 04/26/2015   Prostatitis with Urosepsis 05/11/2014   Bacterial infection due to Klebsiella pneumoniae 04/20/2014   Pyelonephritis 04/17/2014   Combined forms of age-related cataract of left eye 03/23/2014   Regular astigmatism of left eye 03/23/2014   Dyspepsia 02/23/2014   Shingles outbreak 10/03/2013   Right shoulder pain 10/03/2013    Allergies: No Known Allergies Medications:  Current Outpatient  Medications:    Budeson-Glycopyrrol-Formoterol (BREZTRI AEROSPHERE) 160-9-4.8 MCG/ACT AERO, Inhale 2 puffs into the lungs 2 (two) times daily., Disp: 1 g, Rfl: 11   levofloxacin (LEVAQUIN) 500 MG tablet, Take 1 tablet (500 mg total) by mouth daily., Disp: 10 tablet, Rfl: 0   Multiple Vitamin (MULTIVITAMIN) tablet, Take 1 tablet by mouth daily., Disp: , Rfl:    predniSONE (STERAPRED UNI-PAK 48 TAB) 10 MG (48) TBPK tablet, Take by mouth daily. 12-day taper pack, use as directed for taper, Disp: 1 tablet, Rfl: 0   tamsulosin (FLOMAX) 0.4 MG CAPS capsule, Take 0.4 mg by mouth daily., Disp: , Rfl:   Observations/Objective: Patient is well-developed, well-nourished in no acute distress.  Resting comfortably  at home.  Head is normocephalic, atraumatic.  No labored breathing.  Speech is clear and coherent with logical content.  Patient is alert and oriented at baseline.  Slight SOB while speaking  Assessment and Plan: Jason Nearing. in today with chief complaint of Medication Problem   1. Community acquired pneumonia, unspecified laterality  Finish steroids prescribed Continue breztri as prescribed Start  on doxycycline COntact PCP Monday to let them know what is going on.  Meds ordered this encounter  Medications   doxycycline (VIBRA-TABS) 100 MG tablet    Sig: Take 1 tablet (100 mg total) by mouth 2 (two) times daily. 1 po bid    Dispense:  20 tablet    Refill:  0    Order Specific Question:   Supervising Provider    Answer:   Noemi Chapel [[id number]]      Follow Up Instructions: I discussed the assessment and treatment plan with the patient. The patient was provided an opportunity to ask questions and all were answered. The patient agreed with the plan and demonstrated an understanding of the instructions.  A copy of instructions were sent to the patient via MyChart.  The patient was advised to call back or seek an in-person evaluation if the symptoms worsen or if the condition  fails to improve as anticipated.  Time:  I spent 10 minutes with the patient via telehealth technology discussing the above problems/concerns.    Mary-Margaret Hassell Done, FNP

## 2021-08-24 NOTE — ED Triage Notes (Addendum)
Less SOB today after starting steroid taper yesterday  Wanted to see if CXR was better  Dx w/  walking pneumonia by PCP per pt

## 2021-08-24 NOTE — ED Provider Notes (Signed)
[name]    CSN: [id number] Arrival date & time: 08/24/21  1128      History   Chief Complaint Chief Complaint  Patient presents with   Wheezing    HPI Jason Abbott. is a 76 y.o. male.   HPI  Cough, SOB, wheezing and fatigue.  Off and on for 2 weeks.  Already took z pak and prednisone from PCP.  Is now back on prednisone and still feels bad.  Exposed to pneumonia from sister.  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   Leukocytosis 01/23/2019   Lumbar spinal stenosis 12/21/2018   Numbness and tingling in both hands 10/20/2018   Hyperlipidemia, mixed 06/01/2018   Seborrheic keratosis 01/12/2018   Annual physical exam 04/26/2015   Prostatitis with Urosepsis 05/11/2014   Bacterial infection due to Klebsiella pneumoniae 04/20/2014   Pyelonephritis 04/17/2014   Combined 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 Authorizing Provider  levofloxacin (LEVAQUIN) 500 MG tablet Take 1 tablet (500 mg  total) by mouth daily. 08/24/21  Yes Raylene Everts, MD  Budeson-Glycopyrrol-Formoterol (BREZTRI AEROSPHERE) 160-9-4.8 MCG/ACT AERO Inhale 2 puffs into the lungs 2 (two) times daily. 08/12/21   Silverio Decamp, MD  Multiple Vitamin (MULTIVITAMIN) tablet Take 1 tablet by mouth daily.    [provider]  predniSONE (STERAPRED UNI-PAK 48 TAB) 10 MG (48) TBPK tablet Take by mouth daily. 12-day taper pack, use as directed for taper 08/21/21   Silverio Decamp, MD  tamsulosin (FLOMAX) 0.4 MG CAPS capsule Take 0.4 mg by mouth daily. 08/20/21   [provider]    Family History Family History  Problem Relation Age of Onset   Cancer Mother        pancreatic   COPD Father    Diabetes Sister    Diabetes Brother    Diabetes Brother     Social History Social History   Tobacco Use   Smoking status: Former    Packs/day: 0.50    Years: 23.00    Total pack years: 11.50    Types: Cigarettes    Quit date: 03/05/1988    Years since quitting: 33.4   Smokeless tobacco: Never  Vaping Use   Vaping Use: Never used  Substance Use Topics   Alcohol use: Not Currently    Comment: rarely   Drug use: No     Allergies   Patient has no known allergies.  Review of Systems Review of Systems See HPI  Physical Exam Triage Vital Signs ED Triage Vitals  Enc Vitals Group     BP 08/24/21 1146 (!) 142/83     Pulse Rate 08/24/21 1146 66     Resp 08/24/21 1146 16     Temp 08/24/21 1146 98.7 F (37.1 C)     Temp Source 08/24/21 1146 Oral     SpO2 08/24/21 1146 95 %     Weight 08/24/21 1149 182 lb (82.6 kg)     Height 08/24/21 1149 5' 10.75" (1.797 m)     Head Circumference --      Peak Flow --      Pain Score 08/24/21 1148 0     Pain Loc --      Pain Edu? --      Excl. in [place]? --    No data found.  Updated Vital Signs BP (!) 142/83 (BP Location: Left Arm)   Pulse 66   Temp 98.7 F (37.1 C) (Oral)   Resp 16   Ht 5' 10.75" (1.797 m)   Wt 82.6 kg   SpO2 95%    BMI 25.56 kg/m       Physical Exam Constitutional:      General: He is not in acute distress.    Appearance: He is well-developed.  HENT:     Head: Normocephalic and atraumatic.  Eyes:     Conjunctiva/sclera: Conjunctivae normal.     Pupils: Pupils are equal, round, and reactive to light.  Cardiovascular:     Rate and Rhythm: Normal rate.  Pulmonary:     Effort: Pulmonary effort is normal. No respiratory distress.     Comments: Coarse breath sounds R>L Abdominal:     General: There is no distension.     Palpations: Abdomen is soft.  Musculoskeletal:        General: Normal range of motion.     Cervical back: Normal range of motion.  Skin:    General: Skin is warm and dry.  Neurological:     Mental Status: He is alert.      UC Treatments / Results  Labs (all labs ordered are listed, but only abnormal results are displayed) Labs Reviewed - No data to display  EKG   Radiology DG Chest 2 View  Result Date: 08/24/2021 CLINICAL DATA:  Shortness of breath with cough and wheezing. EXAM: CHEST - 2 VIEW COMPARISON:  Chest radiograph 08/12/2021, CTA chest 08/19/2021 FINDINGS: A focal bulge along the AP window is consistent with the known saccular aortic aneurysm better seen on prior CTA. The heart size is stable. Calcified mediastinal lymph nodes are unchanged. There is patchy opacity in the right middle lobe which is increased from the prior study suspicious for pneumonia. There is no other focal airspace disease. There is no pulmonary edema. There is no pleural effusion or pneumothorax There is no acute osseous abnormality. IMPRESSION: 1. Suspect right middle lobe pneumonia. Recommend follow-up radiographs in 3-4 weeks to assess for resolution. 2. Stable abnormal mediastinal contour consistent with the known aortic aneurysm better seen on recent CTA chest. Electronically Signed   By: Valetta Mole M.D.   On: 08/24/2021 12:46    Procedures Procedures (including critical care  time)  Medications Ordered in UC Medications - No data to display  Initial Impression / Assessment and Plan / UC Course  I have reviewed the triage vital signs and the nursing notes.  Pertinent labs & imaging results that were  available during my care of the patient were reviewed by me and considered in my medical decision making (see chart for details).     Final Clinical Impressions(s) / UC Diagnoses   Final diagnoses:  Community acquired pneumonia of right middle lobe of lung     Discharge Instructions      Continue to drink lots of water Take antibiotic once a day Continue prednisone Call your doctor for follow-up   ED Prescriptions     Medication Sig Dispense Auth. Provider   levofloxacin (LEVAQUIN) 500 MG tablet Take 1 tablet (500 mg total) by mouth daily. 10 tablet Raylene Everts, MD      PDMP not reviewed this encounter.   Raylene Everts, MD 08/24/21 1325

## 2021-08-26 ENCOUNTER — Inpatient Hospital Stay: Payer: Medicare Other

## 2021-08-26 ENCOUNTER — Inpatient Hospital Stay: Payer: Medicare Other | Attending: Hematology & Oncology

## 2021-08-26 ENCOUNTER — Ambulatory Visit: Payer: Medicare Other | Admitting: Sports Medicine

## 2021-08-26 ENCOUNTER — Other Ambulatory Visit: Payer: Self-pay

## 2021-08-26 ENCOUNTER — Inpatient Hospital Stay (HOSPITAL_BASED_OUTPATIENT_CLINIC_OR_DEPARTMENT_OTHER): Payer: Medicare Other | Admitting: Hematology & Oncology

## 2021-08-26 ENCOUNTER — Encounter: Payer: Self-pay | Admitting: Hematology & Oncology

## 2021-08-26 ENCOUNTER — Ambulatory Visit: Payer: Medicare Other | Admitting: Hematology & Oncology

## 2021-08-26 VITALS — BP 125/70 | HR 58 | Temp 97.8°F | Resp 19 | Wt 181.0 lb

## 2021-08-26 DIAGNOSIS — C9110 Chronic lymphocytic leukemia of B-cell type not having achieved remission: Secondary | ICD-10-CM | POA: Diagnosis not present

## 2021-08-26 LAB — CMP (CANCER CENTER ONLY)
ALT: 19 U/L (ref 0–44)
AST: 21 U/L (ref 15–41)
Albumin: 3.9 g/dL (ref 3.5–5.0)
Alkaline Phosphatase: 64 U/L (ref 38–126)
Anion gap: 10 (ref 5–15)
BUN: 21 mg/dL (ref 8–23)
CO2: 27 mmol/L (ref 22–32)
Calcium: 9.4 mg/dL (ref 8.9–10.3)
Chloride: 101 mmol/L (ref 98–111)
Creatinine: 1.04 mg/dL (ref 0.61–1.24)
GFR, Estimated: >60 mL/min (ref >60)
Glucose, Bld: 93 mg/dL (ref 70–99)
Potassium: 4.1 mmol/L (ref 3.5–5.1)
Sodium: 138 mmol/L (ref 135–145)
Total Bilirubin: 0.6 mg/dL (ref 0.3–1.2)
Total Protein: 6.8 g/dL (ref 6.5–8.1)

## 2021-08-26 LAB — CBC WITH DIFFERENTIAL (CANCER CENTER ONLY)
Abs Immature Granulocytes: 0.13 10*3/uL — ABNORMAL HIGH (ref 0.00–0.07)
Basophils Absolute: 0.1 10*3/uL (ref 0.0–0.1)
Basophils Relative: 0 %
Eosinophils Absolute: 0 10*3/uL (ref 0.0–0.5)
Eosinophils Relative: 0 %
HCT: 45 % (ref 39.0–52.0)
Hemoglobin: 14.6 g/dL (ref 13.0–17.0)
Immature Granulocytes: 1 %
Lymphocytes Relative: 58 %
Lymphs Abs: 15 10*3/uL — ABNORMAL HIGH (ref 0.7–4.0)
MCH: 31.2 pg (ref 26.0–34.0)
MCHC: 32.4 g/dL (ref 30.0–36.0)
MCV: 96.2 fL (ref 80.0–100.0)
Monocytes Absolute: 0.7 10*3/uL (ref 0.1–1.0)
Monocytes Relative: 3 %
Neutro Abs: 9.7 10*3/uL — ABNORMAL HIGH (ref 1.7–7.7)
Neutrophils Relative %: 38 %
Platelet Count: 146 10*3/uL — ABNORMAL LOW (ref 150–400)
RBC: 4.68 MIL/uL (ref 4.22–5.81)
RDW: 13.7 % (ref 11.5–15.5)
Smear Review: NORMAL
WBC Count: 25.6 10*3/uL — ABNORMAL HIGH (ref 4.0–10.5)
nRBC: 0 % (ref 0.0–0.2)

## 2021-08-26 LAB — SAVE SMEAR(SSMR), FOR PROVIDER SLIDE REVIEW

## 2021-08-26 LAB — LACTATE DEHYDROGENASE: LDH: 171 U/L (ref 98–192)

## 2021-08-26 NOTE — Progress Notes (Signed)
Hematology and Oncology Follow Up Visit  Jason Abbott [id number] [birth year] 76 y.o. 08/26/2021   Principle Diagnosis:  Stage A CLL  Current Therapy:   Observation    Interim History:  Jason Abbott is here today for follow-up.  As always, he has been traveling.  He did go out to [place] state.  He saw some family out there.  They drove.  Is a 7000 mile round trip drive.  He told me all about it.  He really had a wonderful time.  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  He has had no problems with nausea or vomiting.  He has had no cough or shortness of breath.  His sister recently passed away.  He thought maybe he developed "walking pneumonia.".  He has had no rashes.  Has not noted any swollen lymph nodes.  Currently, I would say his performance status is probably ECOG 0.   Medications:  Allergies as of 08/26/2021   No Known Allergies      Medication List        Accurate as of August 26, 2021 10:49 AM. If you have any questions, ask your nurse or doctor.          Breztri Aerosphere 160-9-4.8 MCG/ACT Aero Generic drug: Budeson-Glycopyrrol-Formoterol Inhale 2 puffs into the lungs 2 (two) times daily.   doxycycline 100 MG tablet Commonly known as: VIBRA-TABS Take 1 tablet (100 mg total) by mouth 2 (two) times daily. 1 po bid   levofloxacin 500 MG tablet Commonly known as: LEVAQUIN Take 1 tablet (500 mg total) by mouth daily.   multivitamin tablet Take 1 tablet by mouth daily.   predniSONE 10 MG (48) Tbpk tablet Commonly known as: STERAPRED UNI-PAK 48 TAB Take by mouth daily. 12-day taper pack, use as directed for taper   tamsulosin 0.4 MG Caps capsule Commonly known as: FLOMAX Take 0.4 mg by mouth daily.        Allergies: No Known Allergies  Past Medical History, Surgical history, Social history, and Family History were reviewed and updated.  Review of Systems: Review of Systems   Constitutional: Negative.   HENT: Negative.    Eyes: Negative.   Respiratory: Negative.    Cardiovascular: Negative.   Gastrointestinal: Negative.   Genitourinary: Negative.   Musculoskeletal: Negative.   Skin: Negative.   Neurological: Negative.   Endo/Heme/Allergies: Negative.   Psychiatric/Behavioral: Negative.       Physical Exam:  weight is 181 lb (82.1 kg). His oral temperature is 97.8 F (36.6 C). His blood pressure is 125/70 and his pulse is 58 (abnormal). His respiration is 19 and oxygen saturation is 98%.   Wt Readings from Last 3 Encounters:  08/26/21 181 lb (82.1 kg)  08/24/21 182 lb (82.6 kg)  08/12/21 182 lb (82.6 kg)    Physical Exam Vitals reviewed.  HENT:     Head: Normocephalic and atraumatic.  Eyes:     Pupils: Pupils are equal, round, and reactive to light.  Cardiovascular:     Rate and Rhythm: Normal rate and regular rhythm.     Heart sounds: Normal heart sounds.  Pulmonary:     Effort: Pulmonary effort is normal.     Breath sounds: Normal breath sounds.  Abdominal:     General: Bowel sounds are normal.     Palpations: Abdomen is soft.  Musculoskeletal:        General: No tenderness  or deformity. Normal range of motion.     Cervical back: Normal range of motion.  Lymphadenopathy:     Cervical: No cervical adenopathy.  Skin:    General: Skin is warm and dry.     Findings: No erythema or rash.  Neurological:     Mental Status: He is alert and oriented to person, place, and time.  Psychiatric:        Behavior: Behavior normal.        Thought Content: Thought content normal.        Judgment: Judgment normal.      Lab Results  Component Value Date   WBC 25.6 (H) 08/26/2021   HGB 14.6 08/26/2021   HCT 45.0 08/26/2021   MCV 96.2 08/26/2021   PLT 146 (L) 08/26/2021   No results found for: "FERRITIN", "IRON", "TIBC", "UIBC", "IRONPCTSAT" Lab Results  Component Value Date   RBC 4.68 08/26/2021   No results found for: "KPAFRELGTCHN",  "LAMBDASER", "[facility]" Lab Results  Component Value Date   IGGSERUM 997 02/25/2021   IGA 156 02/25/2021   IGMSERUM 45 02/25/2021   No results found for: "TOTALPROTELP", "ALBUMINELP", "A1GS", "A2GS", "BETS", "BETA2SER", "GAMS", "MSPIKE", "SPEI"   Chemistry      Component Value Date/Time   NA 138 08/26/2021 0943   K 4.1 08/26/2021 0943   CL 101 08/26/2021 0943   CO2 27 08/26/2021 0943   BUN 21 08/26/2021 0943   CREATININE 1.04 08/26/2021 0943   CREATININE 1.03 08/15/2021 0000      Component Value Date/Time   CALCIUM 9.4 08/26/2021 0943   ALKPHOS 64 08/26/2021 0943   AST 21 08/26/2021 0943   ALT 19 08/26/2021 0943   BILITOT 0.6 08/26/2021 0943       Impression and Plan: Jason Abbott is a very pleasant 76 yo caucasian gentleman with stage A CLL.  Everything has been holding pretty stable.  Because of this, I think we can probably get him back in a year.  I feel confident about this.  I really do not see that anything is going to be a problem.  I doubt that his leukemia will become more active within the year.  I am just happy that his quality life is doing so well.  I am happy that he is able to travel.  I am happy that he is able to go to nursing homes and may people smile with his music.    Volanda Napoleon, MD 8/21/202310:49 AM

## 2021-08-27 ENCOUNTER — Encounter: Payer: Self-pay | Admitting: Sports Medicine

## 2021-08-27 ENCOUNTER — Ambulatory Visit (INDEPENDENT_AMBULATORY_CARE_PROVIDER_SITE_OTHER): Payer: Medicare Other | Admitting: Sports Medicine

## 2021-08-27 DIAGNOSIS — J189 Pneumonia, unspecified organism: Secondary | ICD-10-CM | POA: Diagnosis not present

## 2021-08-27 LAB — KAPPA/LAMBDA LIGHT CHAINS
Kappa free light chain: 12.8 mg/L (ref 3.3–19.4)
Kappa, lambda light chain ratio: 0.51 (ref 0.26–1.65)
Lambda free light chains: 24.9 mg/L (ref 5.7–26.3)

## 2021-08-27 LAB — BETA 2 MICROGLOBULIN, SERUM: Beta-2 Microglobulin: 2.7 mg/L — ABNORMAL HIGH (ref 0.6–2.4)

## 2021-08-27 LAB — IGG, IGA, IGM
IgA: 149 mg/dL (ref 61–437)
IgG (Immunoglobin G), Serum: 808 mg/dL (ref 603–1613)
IgM (Immunoglobulin M), Srm: 39 mg/dL (ref 15–143)

## 2021-08-27 NOTE — Assessment & Plan Note (Addendum)
Pleasant 76 year old male returns, he does have a history of COVID-pneumonia and some chronic lung scarring on high-resolution CT, historically symptoms controlled on 5 mg of daily prednisone and Breztri. He stopped all the above about 2 years ago. I saw him approximately 2 weeks ago with worsening shortness of breath without cough or constitutional symptoms, poor air movement on the right side. Ultimately the chest x-ray showed a right lower lobe pneumonia, we treated him with azithromycin, prednisone, restarted inhaler, he went to urgent care along the way and had another x-ray which showed the same and doxycycline was started, we also added a 12-day prednisone taper on top of that, returns today symptom-free and doing very well.

## 2021-08-27 NOTE — Progress Notes (Signed)
    Procedures performed today:    None.  Independent interpretation of notes and tests performed by another provider:   None.  Brief History, Exam, Impression, and Recommendations:    Community acquired pneumonia Pleasant 76 year old male returns, he does have a history of COVID-pneumonia and some chronic lung scarring on high-resolution CT, historically symptoms controlled on 5 mg of daily prednisone and Breztri. He stopped all the above about 2 years ago. I saw him approximately 2 weeks ago with worsening shortness of breath without cough or constitutional symptoms, poor air movement on the right side. Ultimately the chest x-ray showed a right lower lobe pneumonia, we treated him with azithromycin, prednisone, restarted inhaler, he went to urgent care along the way and had another x-ray which showed the same and doxycycline was started, we also added a 12-day prednisone taper on top of that, returns today symptom-free and doing very well.    ____________________________________________ Gwen Her. Dianah Field, M.D., ABFM., CAQSM., AME. Primary Care and Sports Medicine [facility]  Adjunct Professor of [employer] [facility] [employer]  Risk manager

## 2021-08-30 ENCOUNTER — Encounter: Payer: Medicare Other | Admitting: Thoracic Surgery (Cardiothoracic Vascular Surgery)

## 2021-09-02 ENCOUNTER — Ambulatory Visit: Payer: Medicare Other | Admitting: Cardiothoracic Surgery

## 2021-09-03 ENCOUNTER — Ambulatory Visit (INDEPENDENT_AMBULATORY_CARE_PROVIDER_SITE_OTHER): Payer: Medicare Other | Admitting: Cardiothoracic Surgery

## 2021-09-03 ENCOUNTER — Other Ambulatory Visit: Payer: Self-pay | Admitting: *Deleted

## 2021-09-03 ENCOUNTER — Encounter: Payer: Self-pay | Admitting: Cardiothoracic Surgery

## 2021-09-03 VITALS — BP 132/83 | HR 72 | Resp 18 | Ht 70.0 in | Wt 180.0 lb

## 2021-09-03 DIAGNOSIS — I7122 Aneurysm of the aortic arch, without rupture: Secondary | ICD-10-CM

## 2021-09-03 DIAGNOSIS — I253 Aneurysm of heart: Secondary | ICD-10-CM

## 2021-09-03 DIAGNOSIS — Q278 Other specified congenital malformations of peripheral vascular system: Secondary | ICD-10-CM | POA: Insufficient documentation

## 2021-09-03 DIAGNOSIS — Z01818 Encounter for other preprocedural examination: Secondary | ICD-10-CM

## 2021-09-03 DIAGNOSIS — J189 Pneumonia, unspecified organism: Secondary | ICD-10-CM

## 2021-09-03 NOTE — Progress Notes (Signed)
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  The current CT scan was done to evaluate his lungs after being treated for pneumonia.  He denies any chest pain.  He denies any angina or symptoms of CHF.  He is currently finishing course of oral antibiotics (azithromycin) and a prednisone taper as well as bronchodilator inhalers.  He states his strength is improving but he is far from back to his normal activity tolerance.  I reviewed the CTA demonstrating a lesser curve arch aneurysm possibly related to a traumatic thoracic injury several years ago.  He has relatively normal aorta proximal and distal to the arch.  He has in a parent right subclavian artery from the distal arch.  He appears to be a candidate for TEVAR with aortic arch deep branching via sternotomy.  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 He has had past stroke for which she has had full recovery. Current Outpatient Medications  Medication Sig Dispense Refill   Budeson-Glycopyrrol-Formoterol (BREZTRI AEROSPHERE) 160-9-4.8 MCG/ACT AERO Inhale 2 puffs into the lungs 2 (two) times daily. 1 g 11   predniSONE (STERAPRED UNI-PAK 48 TAB) 10 MG (48) TBPK tablet Take by mouth daily. 12-day taper pack, use as directed for taper 1 tablet 0   tamsulosin (FLOMAX) 0.4 MG CAPS capsule Take 0.4 mg by mouth daily.     No current facility-administered medications for this visit.     Physical Exam: \Blood pressure 132/83, pulse 72, resp. rate 18, height '5\' 10"'$  (1.778 m), weight 180 lb (81.6 kg), SpO2 95 %.       Physical Exam  General: Healthy-appearing 76 year old male HEENT: Normocephalic pupils equal , dentition adequate Neck: Supple without JVD, adenopathy, or bruit Chest: Right-sided rhonchi to auscultation, no tenderness or  deformity             Cardiovascular: Regular rate and rhythm, no murmur, no gallop, peripheral pulses             palpable in all extremities Abdomen:  Soft, nontender, no palpable mass or organomegaly Extremities: Warm, well-perfused, no clubbing cyanosis edema or tenderness,              no venous stasis changes of the legs Rectal/GU: Deferred Neuro: Grossly non--focal and symmetrical throughout Skin: Clean and dry without rash or ulceration  Diagnostic Tests: CT images reviewed with patient and his wife with recommendation for proceeding with surgical repair of the lesser curve arch aneurysm with TEVAR and aortic arch debranching  Impression: 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  Dr. [name] will evaluate the patient as well for a combined procedure.  We will need to evaluate his cardiac function with echo and cardiac CT and follow-up PFTs since he apparently has had some COVID pulmonary structural damage in the past.   Dahlia Byes, MD [facility] [phone]

## 2021-09-04 ENCOUNTER — Encounter (INDEPENDENT_AMBULATORY_CARE_PROVIDER_SITE_OTHER): Payer: Medicare Other | Admitting: Sports Medicine

## 2021-09-04 ENCOUNTER — Encounter: Payer: Self-pay | Admitting: Hematology & Oncology

## 2021-09-04 DIAGNOSIS — J189 Pneumonia, unspecified organism: Secondary | ICD-10-CM | POA: Diagnosis not present

## 2021-09-04 NOTE — Telephone Encounter (Signed)
Chest Xray pended.

## 2021-09-05 ENCOUNTER — Encounter: Payer: Self-pay | Admitting: Surgery

## 2021-09-05 ENCOUNTER — Ambulatory Visit (INDEPENDENT_AMBULATORY_CARE_PROVIDER_SITE_OTHER): Payer: Medicare Other | Admitting: Surgery

## 2021-09-05 VITALS — BP 132/82 | HR 71 | Temp 98.1°F | Resp 18 | Ht 70.5 in | Wt 177.3 lb

## 2021-09-05 DIAGNOSIS — I7112 Aneurysm of the aortic arch, ruptured: Secondary | ICD-10-CM

## 2021-09-05 NOTE — Telephone Encounter (Signed)

## 2021-09-05 NOTE — Progress Notes (Signed)
[facility] [place]  Patient name: Jason Abbott. MRN: [id number] DOB: 1945-10-31 Sex: male   REQUESTING PROVIDER:    Dr. Prescott Gum   REASON FOR CONSULT:    Aortic arch aneurysm  HISTORY OF PRESENT ILLNESS:   Fannie Alomar. is a 76 y.o. male, who is today for evaluation of a aortic arch aneurysm that was first noticed approximately 2 years ago during imaging for COVID.  At that time was 4.9 cm.  He recently had a scan showing that it has increased in size to approximately 6 cm.  He is being considered for arch reconstruction and thoracic stent graft to exclude his aneurysm.  The patient is currently being treated for pneumonia with antibiotics and steroid taper as well as inhalers.  He is very active and plays an electric accordion regularly which weighs approximately 28 pounds.  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  He has had a stroke in the past with no residual deficits.  He is a former smoker.  He does not report any trauma other than a injury in the [employer] where he hurt his [facility] joint from a significant fall  PAST MEDICAL HISTORY    Past Medical History:  Diagnosis Date   Bowel obstruction ([place])    Diverticul disease small and large intestine, no perforati or abscess    H/O urinary infection    Stroke ([place]) 01/2019     FAMILY HISTORY   Family History  Problem Relation Age of Onset   Cancer Mother        pancreatic   COPD Father    Diabetes Sister    Diabetes Brother    Diabetes Brother     SOCIAL HISTORY:   Social History   Socioeconomic History   Marital status: Married    Spouse name: [name]   Number of children: Not on file   Years of education: Not on file   Highest education level: Some college, no degree  Occupational History    Comment: retired Corporate treasurer, Biomedical scientist, works part time  Tobacco Use   Smoking status: Former    Packs/day: 0.50    Years: 23.00     Total pack years: 11.50    Types: Cigarettes    Quit date: 03/05/1988    Years since quitting: 33.5   Smokeless tobacco: Never  Vaping Use   Vaping Use: Never used  Substance and Sexual Activity   Alcohol use: Not Currently    Comment: rarely   Drug use: No   Sexual activity: Not on file  Other Topics Concern   Not on file  Social History Narrative   lives with wife   Caffeine- coffee 1-2 daily   Social Determinants of Health   Financial Resource Strain: Not on file  Food Insecurity: Not on file  Transportation Needs: Not on file  Physical Activity: Not on file  Stress: Not on file  Social Connections: Not on file  Intimate Partner Violence: Not on file    ALLERGIES:    No Known Allergies  CURRENT MEDICATIONS:    Current Outpatient Medications  Medication Sig Dispense Refill   Budeson-Glycopyrrol-Formoterol (BREZTRI AEROSPHERE) 160-9-4.8 MCG/ACT AERO Inhale 2 puffs into the lungs 2 (two) times daily. 1 g 11   predniSONE (STERAPRED UNI-PAK 48 TAB) 10 MG (48) TBPK tablet Take by mouth daily. 12-day taper pack, use as directed for taper 1 tablet 0   tamsulosin (FLOMAX)  0.4 MG CAPS capsule Take 0.4 mg by mouth daily.     No current facility-administered medications for this visit.    REVIEW OF SYSTEMS:   '[X]'$  denotes positive finding, '[ ]'$  denotes negative finding Cardiac  Comments:  Chest pain or chest pressure:    Shortness of breath upon exertion:    Short of breath when lying flat:    Irregular heart rhythm:        Vascular    Pain in calf, thigh, or hip brought on by ambulation:    Pain in feet at night that wakes you up from your sleep:     Blood clot in your veins:    Leg swelling:         Pulmonary    Oxygen at home:    Productive cough:     Wheezing:         Neurologic    Sudden weakness in arms or legs:     Sudden numbness in arms or legs:     Sudden onset of difficulty speaking or slurred speech:    Temporary loss of vision in one eye:      Problems with dizziness:         Gastrointestinal    Blood in stool:      Vomited blood:         Genitourinary    Burning when urinating:     Blood in urine:        Psychiatric    Major depression:         Hematologic    Bleeding problems:    Problems with blood clotting too easily:        Skin    Rashes or ulcers:        Constitutional    Fever or chills:     PHYSICAL EXAM:   Vitals:   09/05/21 1015  BP: 132/82  Pulse: 71  Resp: 18  Temp: 98.1 F (36.7 C)  TempSrc: Temporal  SpO2: 93%  Weight: 177 lb 4.8 oz (80.4 kg)  Height: 5' 10.5" (1.791 m)    GENERAL: The patient is a well-nourished male, in no acute distress. The vital signs are documented above. CARDIAC: There is a regular rate and rhythm.  VASCULAR: Palpable bilateral radial and bilateral pedal pulses PULMONARY: Nonlabored respirations ABDOMEN: Soft and non-tender  MUSCULOSKELETAL: There are no major deformities or cyanosis. NEUROLOGIC: No focal weakness or paresthesias are detected. SKIN: There are no ulcers or rashes noted. PSYCHIATRIC: The patient has a normal affect.  STUDIES:   I have reviewed his CT angio of the chest with the following findings: 6.0 x 4.6 cm saccular aneurysm is seen arising from inferior aspect of the transverse aortic arch which is significantly enlarged compared to prior exam. Cardiothoracic surgery consultation recommended due to increased risk of rupture for arch aneurysm ? 5.5 cm. This recommendation follows 2010 ACCF/AHA/AATS/ACR/ASA/SCA/SCAI/SIR/STS/SVM Guidelines for the Diagnosis and Management of Patients With Thoracic Aortic Disease. Circulation. 2010; 121: A355-D322. Aortic aneurysm NOS (ICD10-I71.9). These results will be called to the ordering clinician or representative by the Radiologist Assistant, and communication documented in the PACS or zVision Dashboard.   Coronary artery calcifications are noted.   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   Minimal biapical scarring is noted. Mild bibasilar subsegmental atelectasis is noted.  Prior CT of the abdomen pelvis shows adequate size for access vessels  ASSESSMENT and PLAN   6 cm aortic arch aneurysm: I  discussed with the patient that his anatomy is amenable to aortic arch reconstruction with thoracic stent grafting.  This would require sternotomy as well as a right femoral access.  I discussed the details of the operation with the patient and his wife.  We discussed the potential complications including but not limited to heart attack, stroke, paralysis, bleeding.  I answered all of their questions.  He is currently undergoing work-up to include coronary evaluation as well as carotids and ABIs.  Once this is all been completed, if there are no outstanding issues, he is a good candidate to proceed.  He has 2 functions scheduled to play in early October and so I suspect this will be performed in mid October   [name], IV, MD, [facility] [phone] Pager [phone]

## 2021-09-09 ENCOUNTER — Emergency Department (HOSPITAL_COMMUNITY): Payer: Medicare Other

## 2021-09-09 ENCOUNTER — Inpatient Hospital Stay (HOSPITAL_COMMUNITY)
Admission: EM | Admit: 2021-09-09 | Discharge: 2021-09-13 | DRG: 193 | Disposition: A | Discharge status: Home Health | Payer: Medicare Other | Attending: Internal Medicine | Admitting: Internal Medicine

## 2021-09-09 ENCOUNTER — Telehealth: Payer: Self-pay | Admitting: Emergency Medicine

## 2021-09-09 ENCOUNTER — Encounter (HOSPITAL_COMMUNITY): Payer: Self-pay | Admitting: Internal Medicine

## 2021-09-09 ENCOUNTER — Ambulatory Visit
Admission: RE | Admit: 2021-09-09 | Discharge: 2021-09-09 | Disposition: A | Discharge status: Home / Self Care | Payer: Medicare Other | Source: Ambulatory Visit | Attending: Sports Medicine | Admitting: Sports Medicine

## 2021-09-09 VITALS — BP 143/88 | HR 81 | Temp 100.4°F | Resp 22 | Ht 70.5 in | Wt 177.3 lb

## 2021-09-09 DIAGNOSIS — Z20822 Contact with and (suspected) exposure to covid-19: Secondary | ICD-10-CM | POA: Diagnosis present

## 2021-09-09 DIAGNOSIS — J189 Pneumonia, unspecified organism: Secondary | ICD-10-CM | POA: Diagnosis not present

## 2021-09-09 DIAGNOSIS — J9601 Acute respiratory failure with hypoxia: Secondary | ICD-10-CM | POA: Diagnosis not present

## 2021-09-09 DIAGNOSIS — Z8616 Personal history of COVID-19: Secondary | ICD-10-CM

## 2021-09-09 DIAGNOSIS — D849 Immunodeficiency, unspecified: Secondary | ICD-10-CM | POA: Diagnosis present

## 2021-09-09 DIAGNOSIS — J9690 Respiratory failure, unspecified, unspecified whether with hypoxia or hypercapnia: Secondary | ICD-10-CM | POA: Diagnosis not present

## 2021-09-09 DIAGNOSIS — R509 Fever, unspecified: Secondary | ICD-10-CM | POA: Diagnosis not present

## 2021-09-09 DIAGNOSIS — D696 Thrombocytopenia, unspecified: Secondary | ICD-10-CM

## 2021-09-09 DIAGNOSIS — J449 Chronic obstructive pulmonary disease, unspecified: Secondary | ICD-10-CM | POA: Diagnosis not present

## 2021-09-09 DIAGNOSIS — R252 Cramp and spasm: Secondary | ICD-10-CM

## 2021-09-09 DIAGNOSIS — C9110 Chronic lymphocytic leukemia of B-cell type not having achieved remission: Secondary | ICD-10-CM | POA: Diagnosis not present

## 2021-09-09 DIAGNOSIS — I7122 Aneurysm of the aortic arch, without rupture: Secondary | ICD-10-CM | POA: Diagnosis present

## 2021-09-09 DIAGNOSIS — Z87891 Personal history of nicotine dependence: Secondary | ICD-10-CM

## 2021-09-09 DIAGNOSIS — R7989 Other specified abnormal findings of blood chemistry: Secondary | ICD-10-CM | POA: Diagnosis not present

## 2021-09-09 DIAGNOSIS — I700 Atherosclerosis of aorta: Secondary | ICD-10-CM | POA: Diagnosis not present

## 2021-09-09 DIAGNOSIS — Z808 Family history of malignant neoplasm of other organs or systems: Secondary | ICD-10-CM | POA: Diagnosis not present

## 2021-09-09 DIAGNOSIS — J9811 Atelectasis: Secondary | ICD-10-CM | POA: Diagnosis not present

## 2021-09-09 DIAGNOSIS — R0602 Shortness of breath: Principal | ICD-10-CM

## 2021-09-09 DIAGNOSIS — Z833 Family history of diabetes mellitus: Secondary | ICD-10-CM | POA: Diagnosis not present

## 2021-09-09 DIAGNOSIS — E871 Hypo-osmolality and hyponatremia: Secondary | ICD-10-CM | POA: Diagnosis not present

## 2021-09-09 DIAGNOSIS — E785 Hyperlipidemia, unspecified: Secondary | ICD-10-CM | POA: Diagnosis present

## 2021-09-09 DIAGNOSIS — J439 Emphysema, unspecified: Secondary | ICD-10-CM | POA: Diagnosis not present

## 2021-09-09 DIAGNOSIS — Z01810 Encounter for preprocedural cardiovascular examination: Secondary | ICD-10-CM | POA: Diagnosis not present

## 2021-09-09 DIAGNOSIS — Z8673 Personal history of transient ischemic attack (TIA), and cerebral infarction without residual deficits: Secondary | ICD-10-CM

## 2021-09-09 DIAGNOSIS — N400 Enlarged prostate without lower urinary tract symptoms: Secondary | ICD-10-CM

## 2021-09-09 DIAGNOSIS — Z825 Family history of asthma and other chronic lower respiratory diseases: Secondary | ICD-10-CM | POA: Diagnosis not present

## 2021-09-09 DIAGNOSIS — Z79899 Other long term (current) drug therapy: Secondary | ICD-10-CM | POA: Diagnosis not present

## 2021-09-09 DIAGNOSIS — E8809 Other disorders of plasma-protein metabolism, not elsewhere classified: Secondary | ICD-10-CM

## 2021-09-09 DIAGNOSIS — R0902 Hypoxemia: Secondary | ICD-10-CM | POA: Diagnosis not present

## 2021-09-09 LAB — CBC WITH DIFFERENTIAL/PLATELET
Abs Immature Granulocytes: 0 10*3/uL (ref 0.00–0.07)
Basophils Absolute: 0.3 10*3/uL — ABNORMAL HIGH (ref 0.0–0.1)
Basophils Relative: 2 %
Eosinophils Absolute: 0.2 10*3/uL (ref 0.0–0.5)
Eosinophils Relative: 1 %
HCT: 42.8 % (ref 39.0–52.0)
Hemoglobin: 14 g/dL (ref 13.0–17.0)
Lymphocytes Relative: 50 %
Lymphs Abs: 8.6 10*3/uL — ABNORMAL HIGH (ref 0.7–4.0)
MCH: 31 pg (ref 26.0–34.0)
MCHC: 32.7 g/dL (ref 30.0–36.0)
MCV: 94.9 fL (ref 80.0–100.0)
Monocytes Absolute: 0.7 10*3/uL (ref 0.1–1.0)
Monocytes Relative: 4 %
Neutro Abs: 7.4 10*3/uL (ref 1.7–7.7)
Neutrophils Relative %: 43 %
Platelets: 90 10*3/uL — ABNORMAL LOW (ref 150–400)
RBC: 4.51 MIL/uL (ref 4.22–5.81)
RDW: 13.6 % (ref 11.5–15.5)
WBC: 17.1 10*3/uL — ABNORMAL HIGH (ref 4.0–10.5)
nRBC: 0 % (ref 0.0–0.2)
nRBC: 0 /100 WBC

## 2021-09-09 LAB — COMPREHENSIVE METABOLIC PANEL
ALT: 19 U/L (ref 0–44)
AST: 21 U/L (ref 15–41)
Albumin: 3.3 g/dL — ABNORMAL LOW (ref 3.5–5.0)
Alkaline Phosphatase: 62 U/L (ref 38–126)
Anion gap: 10 (ref 5–15)
BUN: 12 mg/dL (ref 8–23)
CO2: 23 mmol/L (ref 22–32)
Calcium: 8.6 mg/dL — ABNORMAL LOW (ref 8.9–10.3)
Chloride: 100 mmol/L (ref 98–111)
Creatinine, Ser: 1.06 mg/dL (ref 0.61–1.24)
GFR, Estimated: >60 mL/min (ref >60)
Glucose, Bld: 105 mg/dL — ABNORMAL HIGH (ref 70–99)
Potassium: 3.9 mmol/L (ref 3.5–5.1)
Sodium: 133 mmol/L — ABNORMAL LOW (ref 135–145)
Total Bilirubin: 1 mg/dL (ref 0.3–1.2)
Total Protein: 5.5 g/dL — ABNORMAL LOW (ref 6.5–8.1)

## 2021-09-09 LAB — SARS CORONAVIRUS 2 BY RT PCR: SARS Coronavirus 2 by RT PCR: NEGATIVE

## 2021-09-09 LAB — LACTIC ACID, PLASMA: Lactic Acid, Venous: 1.1 mmol/L (ref 0.5–1.9)

## 2021-09-09 LAB — BRAIN NATRIURETIC PEPTIDE: B Natriuretic Peptide: 73.1 pg/mL (ref 0.0–100.0)

## 2021-09-09 LAB — TROPONIN I (HIGH SENSITIVITY): Troponin I (High Sensitivity): 7 ng/L (ref <18)

## 2021-09-09 MED ORDER — SODIUM CHLORIDE 0.9 % IV SOLN
1.0000 g | Freq: Once | INTRAVENOUS | Status: AC
  Administered 2021-09-10: 1 g via INTRAVENOUS
  Filled 2021-09-09: qty 10

## 2021-09-09 MED ORDER — ACETAMINOPHEN 325 MG PO TABS
650.0000 mg | ORAL_TABLET | Freq: Once | ORAL | Status: AC
  Administered 2021-09-09: 650 mg via ORAL
  Filled 2021-09-09: qty 2

## 2021-09-09 MED ORDER — IOHEXOL 350 MG/ML SOLN
80.0000 mL | Freq: Once | INTRAVENOUS | Status: AC | PRN
  Administered 2021-09-09: 80 mL via INTRAVENOUS

## 2021-09-09 MED ORDER — SODIUM CHLORIDE 0.9 % IV SOLN
500.0000 mg | Freq: Once | INTRAVENOUS | Status: AC
  Administered 2021-09-10: 500 mg via INTRAVENOUS
  Filled 2021-09-09: qty 5

## 2021-09-09 NOTE — ED Notes (Signed)
No tylenol at this time due to need for possible blood cultures at ED

## 2021-09-09 NOTE — ED Provider Notes (Signed)
[facility]    CSN: [id number] Arrival date & time: 09/09/21  1844      History   Chief Complaint Chief Complaint  Patient presents with   Cough    HPI Jason Abbott. is a 76 y.o. male.   HPI Pleasant 76 year old male presents with continued cough.  PCP ordered CXR on 09/04/2021 which patient has not yet performed.  Saccular aortic aneurysm found on CT of chest on 08/19/21 and CXR on 08/24/21.  PMH significant for stroke, aortic arch aneurysm, CLL, and recent history of CAP on 08/24/2021.  Patient reports becoming more short of breath while eating lunch today.  Patient was evaluated by vascular surgery on 09/05/2021 for ruptured aneurysm of aortic arch.  Patient and assessment and plan will be going through cardiac work-up prior to aortic arch reconstruction with thoracic stent grafting.   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   Community acquired pneumonia 03/16/2019   CLL (chronic lymphocytic leukemia) ([place]) 02/23/2019   Aortic arch aneurysm ([place]) 02/09/2019   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/27/2019   Leukocytosis 01/23/2019   Lumbar spinal stenosis 12/21/2018   Numbness and tingling in both hands 10/20/2018   Hyperlipidemia, mixed 06/01/2018   Seborrheic keratosis 01/12/2018   Annual physical exam 04/26/2015   Prostatitis with Urosepsis 05/11/2014   Pyelonephritis 04/17/2014   Combined forms of age-related cataract of left eye 03/23/2014   Regular astigmatism of left eye 03/23/2014   Dyspepsia 02/23/2014   Shingles  outbreak 10/03/2013   Right shoulder pain 10/03/2013    Past Surgical History:  Procedure Laterality Date   HERNIA REPAIR     MECKEL DIVERTICULUM EXCISION     ORIF FINGER / [place] Medications    Prior to Admission medications   Medication Sig Start Date End Date Taking? Authorizing Provider  Budeson-Glycopyrrol-Formoterol (BREZTRI AEROSPHERE) 160-9-4.8 MCG/ACT AERO Inhale 2 puffs into the lungs 2 (two) times daily. 08/12/21   Silverio Decamp, MD  predniSONE (STERAPRED UNI-PAK 48 TAB) 10 MG (48) TBPK tablet Take by mouth daily. 12-day taper pack, use as directed for taper Patient not taking: Reported on 09/09/2021 08/21/21   Silverio Decamp, MD  tamsulosin (FLOMAX) 0.4 MG CAPS capsule Take 0.4 mg by mouth daily. 08/20/21   [provider]    Family History Family History  Problem Relation Age of Onset   Cancer Mother        pancreatic   COPD Father    Diabetes Sister    Diabetes Brother    Diabetes Brother     Social History Social History   Tobacco Use   Smoking status: Former    Packs/day: 0.50    Years: 23.00    Total pack years: 11.50    Types: Cigarettes    Quit date: 03/05/1988    Years since quitting: 33.5   Smokeless tobacco: Never  Vaping Use   Vaping Use: Never used  Substance Use Topics   Alcohol use:  Not Currently    Comment: rarely   Drug use: No     Allergies   Levaquin [levofloxacin]   Review of Systems Review of Systems  Respiratory:  Positive for cough.   All other systems reviewed and are negative.    Physical Exam Triage Vital Signs ED Triage Vitals  Enc Vitals Group     BP      Pulse      Resp      Temp      Temp src      SpO2      Weight      Height      Head Circumference      Peak Flow      Pain Score      Pain Loc      Pain Edu?      Excl. in [place]?    No data found.  Updated Vital Signs BP (!) 143/88 (BP Location: Left Arm)   Pulse 81   Temp (!) 100.4 F (38 C) (Oral)    Resp (!) 22   Ht 5' 10.5" (1.791 m)   Wt 177 lb 4.8 oz (80.4 kg)   SpO2 96%   BMI 25.08 kg/m    Physical Exam Vitals and nursing note reviewed.  Constitutional:      General: He is not in acute distress.    Appearance: Normal appearance. He is normal weight. He is ill-appearing.  HENT:     Head: Normocephalic and atraumatic.     Right Ear: Tympanic membrane, ear canal and external ear normal.     Left Ear: Tympanic membrane, ear canal and external ear normal.     Mouth/Throat:     Mouth: Mucous membranes are moist.     Pharynx: Oropharynx is clear.  Eyes:     Extraocular Movements: Extraocular movements intact.     Conjunctiva/sclera: Conjunctivae normal.     Pupils: Pupils are equal, round, and reactive to light.  Cardiovascular:     Rate and Rhythm: Normal rate and regular rhythm.     Pulses: Normal pulses.     Heart sounds: Normal heart sounds.  Pulmonary:     Effort: Pulmonary effort is normal.     Breath sounds: No wheezing, rhonchi or rales.     Comments: Decreased/diminished breath sounds noted throughout, pulse ox at 90% prior to 2 L of nasal cannula after several minutes pulse ox at 97%; it sounds continue to be nonlabored with no tachypnea Musculoskeletal:     Cervical back: Normal range of motion and neck supple. No tenderness.  Lymphadenopathy:     Cervical: No cervical adenopathy.  Skin:    General: Skin is warm and dry.  Neurological:     General: No focal deficit present.     Mental Status: He is alert and oriented to person, place, and time. Mental status is at baseline.      UC Treatments / Results  Labs (all labs ordered are listed, but only abnormal results are displayed) Labs Reviewed - No data to display  EKG   Radiology No results found.  Procedures Procedures (including critical care time)  Medications Ordered in UC Medications - No data to display  Initial Impression / Assessment and Plan / UC Course  I have reviewed the triage  vital signs and the nursing notes.  Pertinent labs & imaging results that were available during my care of the patient were reviewed by me and considered in my medical decision  making (see chart for details).     MDM: 1.  Shortness of breath-patient transported via EMS to [facility] ED now for further evaluation.  Final Clinical Impressions(s) / UC Diagnoses   Final diagnoses:  Shortness of breath     Discharge Instructions      Patient transported via EMS to [facility] ED now for further evaluation of shortness of breath.     ED Prescriptions   None    PDMP not reviewed this encounter.   Eliezer Lofts, [place] 09/09/21 [place]

## 2021-09-09 NOTE — ED Notes (Deleted)
.  uct

## 2021-09-09 NOTE — Assessment & Plan Note (Signed)
New onset.  May be due to his acute illness with pneumonia.  SCDs for prophylaxis.  No heparin or Lovenox.

## 2021-09-09 NOTE — ED Provider Notes (Signed)
[facility] EMERGENCY DEPARTMENT Provider Note   CSN: [id number] Arrival date & time: 09/09/21  2003     History  No chief complaint on file.   Jason Smolenski Binnie Vonderhaar. is a 76 y.o. male. Presenting with shortness of breath and chest pain.  Patient states that he was recently treated for pneumonia and completed his doxycycline and prednisone 5 days ago.  He initially was feeling better while on this treatment.  However, since completing course he has had worsening shortness of breath and chest pain on exertion.  Today he developed a fever.  He reports continued productive cough. HPI     Home Medications Prior to Admission medications   Medication Sig Start Date End Date Taking? Authorizing Provider  Budeson-Glycopyrrol-Formoterol (BREZTRI AEROSPHERE) 160-9-4.8 MCG/ACT AERO Inhale 2 puffs into the lungs 2 (two) times daily. 08/12/21   Silverio Decamp, MD  tamsulosin (FLOMAX) 0.4 MG CAPS capsule Take 0.4 mg by mouth daily. 08/20/21   [provider]      Allergies    Levaquin [levofloxacin]    Review of Systems   Review of Systems  Constitutional:  Positive for fatigue and fever. Negative for chills.  HENT:  Negative for ear pain and sore throat.   Eyes:  Negative for pain and visual disturbance.  Respiratory:  Positive for cough and shortness of breath.   Cardiovascular:  Negative for chest pain and palpitations.  Gastrointestinal:  Negative for abdominal pain and vomiting.  Genitourinary:  Negative for dysuria and hematuria.  Musculoskeletal:  Negative for arthralgias and back pain.  Skin:  Negative for color change and rash.  Neurological:  Negative for seizures and syncope.  All other systems reviewed and are negative.   Physical Exam Updated Vital Signs BP 132/75   Pulse 74   Temp 100.3 F (37.9 C) (Oral)   Resp 16   SpO2 90%  Physical Exam Vitals and nursing note reviewed.  Constitutional:      General: He is not in acute distress.     Appearance: He is well-developed.  HENT:     Head: Normocephalic and atraumatic.  Eyes:     Conjunctiva/sclera: Conjunctivae normal.  Cardiovascular:     Rate and Rhythm: Normal rate and regular rhythm.     Heart sounds: No murmur heard. Pulmonary:     Effort: Pulmonary effort is normal. No respiratory distress.     Breath sounds: Normal breath sounds.  Abdominal:     Palpations: Abdomen is soft.     Tenderness: There is no abdominal tenderness.  Musculoskeletal:        General: No swelling.     Cervical back: Neck supple.  Skin:    General: Skin is warm and dry.     Capillary Refill: Capillary refill takes less than 2 seconds.  Neurological:     Mental Status: He is alert.  Psychiatric:        Mood and Affect: Mood normal.     ED Results / Procedures / Treatments   Labs (all labs ordered are listed, but only abnormal results are displayed) Labs Reviewed  CBC WITH DIFFERENTIAL/PLATELET - Abnormal; Notable for the following components:      Result Value   WBC 17.1 (*)    Platelets 90 (*)    Lymphs Abs 8.6 (*)    Basophils Absolute 0.3 (*)    All other components within normal limits  COMPREHENSIVE METABOLIC PANEL - Abnormal; Notable for the following components:   Sodium  133 (*)    Glucose, Bld 105 (*)    Calcium 8.6 (*)    Total Protein 5.5 (*)    Albumin 3.3 (*)    All other components within normal limits  SARS CORONAVIRUS 2 BY RT PCR  CULTURE, BLOOD (ROUTINE X 2)  CULTURE, BLOOD (ROUTINE X 2)  BRAIN NATRIURETIC PEPTIDE  LACTIC ACID, PLASMA  LACTIC ACID, PLASMA  TROPONIN I (HIGH SENSITIVITY)  TROPONIN I (HIGH SENSITIVITY)    EKG EKG Interpretation  Date/Time:  Monday September 09 2021 20:47:40 EDT Ventricular Rate:  76 PR Interval:  173 QRS Duration: 146 QT Interval:  408 QTC Calculation: 459 R Axis:   71 Text Interpretation: Sinus rhythm Atrial premature complex Right bundle branch block Inferior infarct, old No old tracing to compare Confirmed by  Sherwood Gambler [phone]) on 09/09/2021 10:35:12 PM  Radiology CT Angio Chest PE W and/or Wo Contrast  Result Date: 09/09/2021 CLINICAL DATA:  Positive D-dimer.  Concern for pulmonary embolism. EXAM: CT ANGIOGRAPHY CHEST WITH CONTRAST TECHNIQUE: Multidetector CT imaging of the chest was performed using the standard protocol during bolus administration of intravenous contrast. Multiplanar CT image reconstructions and MIPs were obtained to evaluate the vascular anatomy. RADIATION DOSE REDUCTION: This exam was performed according to the departmental dose-optimization program which includes automated exposure control, adjustment of the mA and/or kV according to patient size and/or use of iterative reconstruction technique. CONTRAST:  41m OMNIPAQUE IOHEXOL 350 MG/ML SOLN COMPARISON:  Chest CT dated 08/19/2021. Chest radiograph dated 09/10/2018. FINDINGS: Evaluation of this exam is limited due to respiratory motion artifact. Cardiovascular: Mild cardiomegaly. No pericardial effusion. There is coronary vascular calcification. Moderate atherosclerotic calcification of the thoracic aorta. 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 Follow-up as per recommendation of the prior CT with cardiothoracic surgery consult. There is a left-sided aortic arch with aberrant right subclavian artery anatomy. No periaortic fluid collection. No extravasation of contrast. Evaluation of the pulmonary arteries is limited due to respiratory motion. No central acute pulmonary artery embolus identified. Mediastinum/Nodes: Top-normal left hilar lymph node measures 1 cm short axis. No mediastinal adenopathy. The esophagus is grossly unremarkable. No mediastinal fluid collection. Lungs/Pleura: Background of emphysema. Diffuse interstitial thickening and haziness primarily involving the left lung common new since the prior CT and may represent edema or infiltrate. No consolidative changes. There is no pleural  effusion pneumothorax. The central airways are patent. Upper Abdomen: Multiple hepatic hypodense lesions, suboptimally characterized, likely cysts. Musculoskeletal: No acute osseous pathology.  Degenerative changes. Review of the MIP images confirms the above findings. IMPRESSION: 1. No CT evidence of central acute pulmonary artery embolus. 2. Left-sided aortic arch with aberrant right subclavian artery anatomy. No periaortic fluid collection. 3. 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 Follow-up as per recommendation of the prior CT with cardiothoracic surgery consult. 4. Aortic Atherosclerosis (ICD10-I70.0) and Emphysema (ICD10-J43.9). Electronically Signed   By: AAnner CreteM.D.   On: 09/09/2021 23:58   DG Chest Portable 1 View  Result Date: 09/09/2021 CLINICAL DATA:  Shortness of breath. EXAM: PORTABLE CHEST 1 VIEW COMPARISON:  Radiograph 08/24/2021.  CT 08/19/2021 FINDINGS: Patient is rotated. Stable heart size. 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 Comparison with prior exam is difficult due to degree of rotation. Calcified anterior mediastinal lymph nodes. Emphysema, ill-defined opacity in the left hemithorax, not definitively seen on prior. Resolved right infrahilar opacity from prior.  Blunting of the costophrenic angles may be due to effusions or hyperinflation. No pneumothorax. Lower thoracic scoliosis. IMPRESSION: 1. Ill-defined opacity in the left hemithorax, may be infectious or asymmetric edema. 2. 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 Comparison with prior exam is difficult due to degree of rotation. 3. Resolved right infrahilar opacity. 4. Blunting of the costophrenic angles may be due to effusions or hyperinflation. Electronically Signed   By: Keith Rake M.D.   On: 09/09/2021 20:44    Procedures Procedures    Medications Ordered in  ED Medications  cefTRIAXone (ROCEPHIN) 1 g in sodium chloride 0.9 % 100 mL IVPB (has no administration in time range)  azithromycin (ZITHROMAX) 500 mg in sodium chloride 0.9 % 250 mL IVPB (has no administration in time range)  acetaminophen (TYLENOL) tablet 650 mg (650 mg Oral Given 09/09/21 2212)  iohexol (OMNIPAQUE) 350 MG/ML injection 80 mL (80 mLs Intravenous Contrast Given 09/09/21 2330)    ED Course/ Medical Decision Making/ A&P                           Medical Decision Making Amount and/or Complexity of Data Reviewed Labs: ordered. Radiology: ordered.  Risk OTC drugs. Prescription drug management. Decision regarding hospitalization.    76 year old male with past medical history of COPD, diabetes, and known aortic arch aneurysm presenting with shortness of breath and chest pain worsening over the past 5 days, after completion of course of doxycycline and prednisone for pneumonia. Patient arrived to urgent care and was found to be hypoxic and placed on 2 L nasal cannula.,  Patient is on 4 L nasal cannula satting 93%.  He reports his chest pain and shortness of breath have much improved.  Febrile to 100.4 at urgent care and 100.3 on arrival to the ED. Normal EF on echo in May 2021.  Differential diagnosis includes pneumonia, URI, COVID, PE, ACS, AKI, electrolyte abnormalities or new heart failure.  Chest x-ray concerning for new left-sided focal opacity/pneumonia.  Patient given Rocephin and azithromycin for treatment for CAP.  Labs notable for COVID-negative, no lactic acidosis.  Go cytosis of 17.  No significant anemia, AKI, or electrolyte abnormalities.  Troponin 7, BNP reassuring at 73.  Patient admitted due to oxygen requirement.  He remains on 4 L nasal cannula.  He was given Tylenol for fever.  CTA PE study ordered, but pending at time of admission.        Final Clinical Impression(s) / ED Diagnoses Final diagnoses:  Shortness of breath    Rx / DC Orders ED  Discharge Orders     None         Rosine Abe, MD 09/10/21 [id number]    Sherwood Gambler, MD 09/10/21 [phone]

## 2021-09-09 NOTE — Assessment & Plan Note (Signed)
Chronic. Follows with heme/onc.

## 2021-09-09 NOTE — Subjective & Objective (Signed)
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  Room air saturations in the 80s based on home pulse oximeter.  Patient initially seen beginning part of August 2023 for right-sided pneumonia.  He was treated with 5 days of p.o. Zithromax.  He failed to improve.  He went to the urgent care on 08/24/2021.  He was prescribed Levaquin.  However given his history of aneurysm, this was changed to doxycycline.  Patient completed 10 days of doxycycline and prednisone 5 days ago.  Since that time he has had worsening shortness of breath and a cough.  He has not had any recent fevers.  He has noted that with mild exertion, his O2 saturations dropped into the 80s.  He has a home pulse oximeter that he acquired while he had COVID 2 years ago.  Here in the ER temp 100.3, heart rate 79, blood pressure 132/81 satting 88% on room air.  Started on supplemental oxygen.  Labs here in the ER  COVID test was negative.  Sodium 133, BUN of 12, creatinine 1.0  White count 17.1, hemoglobin 14, platelets 90,000  BNP of 73  CT scan on my read showed bilateral lower lobe pneumonia left greater than right.  No large PE noted.

## 2021-09-09 NOTE — ED Notes (Signed)
Patient is being discharged from the Urgent Care and sent to the Emergency Department via [facility]. Per M.Ragan, FNP, patient is in need of higher level of care due to comorbidities,low O2 sats & fever.. Patient is aware and verbalizes understanding of plan of care.  Vitals:   09/09/21 1852 09/09/21 1911  BP: (!) 143/88   Pulse: 81   Resp: 20 (!) 22  Temp: (!) 100.4 F (38 C) (!) 100.4 F (38 C)  SpO2: 90% 96%   Pt to [facility] via [facility]- pt's wife will meet him at the hospital

## 2021-09-09 NOTE — H&P (Signed)
History and Physical    Jason Abbott. 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  Room air saturations in the 80s based on home pulse oximeter.  Patient initially seen beginning part of August 2023 for right-sided pneumonia.  He was treated with 5 days of p.o. Zithromax.  He failed to improve.  He went to the urgent care on 08/24/2021.  He was prescribed Levaquin.  However given his history of aneurysm, this was changed to doxycycline.  Patient completed 10 days of doxycycline and prednisone 5 days ago.  Since that time he has had worsening shortness of breath and a cough.  He has not had any recent fevers.  He has noted that with mild exertion, his O2 saturations dropped into the 80s.  He has a home pulse oximeter that he acquired while he had COVID 2 years ago.  Here in the ER temp 100.3, heart rate 79, blood pressure 132/81 satting 88% on room air.  Started on supplemental oxygen.  Labs here in the ER  COVID test was negative.  Sodium 133, BUN of 12, creatinine 1.0  White count 17.1, hemoglobin 14, platelets 90,000  BNP of 73  CT scan on my read showed bilateral lower lobe pneumonia left greater than right.  No large PE noted.   ED Course: Low-grade fever, hypoxic 88%.  Started on supplemental oxygen.  CT scan shows bilateral lower lobe pneumonia left greater than right.  Review of Systems:  Review of Systems  Constitutional:  Positive for malaise/fatigue.  HENT: Negative.    Eyes: Negative.   Respiratory:  Positive for cough and shortness of breath.        Dry nonproductive cough  Cardiovascular: Negative.    Gastrointestinal: Negative.   Genitourinary: Negative.   Musculoskeletal: Negative.   Skin: Negative.   Neurological: Negative.   Endo/Heme/Allergies: Negative.   Psychiatric/Behavioral: Negative.    All other systems reviewed and are negative.   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 His smoking use included cigarettes. He has a 11.50 pack-year smoking history. He has never used smokeless tobacco. He reports that he does not currently use alcohol. He reports that he does not use drugs.  Allergies  Allergen Reactions   Levaquin [Levofloxacin] Other (See Comments)    Due to medical hx    Family History  Problem Relation Age of Onset   Cancer Mother        pancreatic   COPD Father    Diabetes Sister    Diabetes Brother    Diabetes Brother     Prior to Admission medications   Medication Sig Start Date End Date Taking? Authorizing Provider  Budeson-Glycopyrrol-Formoterol (BREZTRI AEROSPHERE) 160-9-4.8 MCG/ACT AERO  Inhale 2 puffs into the lungs 2 (two) times daily. 08/12/21   Silverio Decamp, MD  predniSONE (STERAPRED UNI-PAK 48 TAB) 10 MG (48) TBPK tablet Take by mouth daily. 12-day taper pack, use as directed for taper Patient not taking: Reported on 09/09/2021 08/21/21   Silverio Decamp, MD  tamsulosin (FLOMAX) 0.4 MG CAPS capsule Take 0.4 mg by mouth daily. 08/20/21   [provider]    Physical Exam: Vitals:   09/09/21 2115 09/09/21 2130 09/09/21 2145 09/09/21 2200  BP: 125/74 120/72 124/81 132/75  Pulse: 76 69 81 74  Resp: (!) '23 20 16 16  '$ Temp:      TempSrc:      SpO2: 94% 92%  93% 90%    Physical Exam Vitals and nursing note reviewed.  Constitutional:      General: He is not in acute distress.    Appearance: Normal appearance. He is normal weight. He is not ill-appearing, toxic-appearing or diaphoretic.  HENT:     Head: Normocephalic and atraumatic.     Nose: Nose normal.  Eyes:     General: No scleral icterus. Cardiovascular:     Rate and Rhythm: Normal rate and regular rhythm.     Pulses: Normal pulses.  Pulmonary:     Effort: No respiratory distress.     Breath sounds: Examination of the right-lower field reveals rales. Examination of the left-lower field reveals rales. Rales present.  Abdominal:     General: Abdomen is flat. Bowel sounds are normal. There is no distension.     Palpations: Abdomen is soft.     Tenderness: There is no abdominal tenderness. There is no guarding or rebound.  Musculoskeletal:     Right lower leg: No edema.     Left lower leg: No edema.  Skin:    General: Skin is warm and dry.     Capillary Refill: Capillary refill takes less than 2 seconds.  Neurological:     General: No focal deficit present.     Mental Status: He is alert and oriented to person, place, and time.      Labs on Admission: I have personally reviewed following labs and imaging studies  CBC: Recent Labs  Lab 09/09/21 2104  WBC 17.1*  NEUTROABS 7.4  HGB 14.0  HCT 42.8  MCV 94.9  PLT 90*   Basic Metabolic Panel: Recent Labs  Lab 09/09/21 2104  NA 133*  K 3.9  CL 100  CO2 23  GLUCOSE 105*  BUN 12  CREATININE 1.06  CALCIUM 8.6*   GFR: Estimated Creatinine Clearance: 61.3 mL/min (by C-G formula based on SCr of 1.06 mg/dL). Liver Function Tests: Recent Labs  Lab 09/09/21 2104  AST 21  ALT 19  ALKPHOS 62  BILITOT 1.0  PROT 5.5*  ALBUMIN 3.3*   No results for input(s): "LIPASE", "AMYLASE" in the last 168 hours. No results for input(s): "AMMONIA" in the last 168 hours. Coagulation Profile: No results for input(s): "INR",  "PROTIME" in the last 168 hours. Cardiac Enzymes: Recent Labs  Lab 09/09/21 2104  TROPONINIHS 7   BNP (last 3 results) No results for input(s): "PROBNP" in the last 8760 hours. HbA1C: No results for input(s): "HGBA1C" in the last 72 hours. CBG: No results for input(s): "GLUCAP" in the last 168 hours. Lipid Profile: No results for input(s): "CHOL", "HDL", "LDLCALC", "TRIG", "CHOLHDL", "LDLDIRECT" in the last 72 hours. Thyroid Function Tests: No results for input(s): "TSH", "T4TOTAL", "FREET4", "T3FREE", "THYROIDAB" in the last 72  hours. Anemia Panel: No results for input(s): "VITAMINB12", "FOLATE", "FERRITIN", "TIBC", "IRON", "RETICCTPCT" in the last 72 hours. Urine analysis:    Component Value Date/Time   COLORURINE YELLOW 12/21/2018 1537   APPEARANCEUR CLOUDY (A) 12/21/2018 1537   LABSPEC 1.019 12/21/2018 1537   PHURINE 7.0 12/21/2018 1537   GLUCOSEU NEGATIVE 12/21/2018 1537   HGBUR NEGATIVE 12/21/2018 1537   BILIRUBINUR negative 11/16/2020 1706   BILIRUBINUR neg 11/09/2018 1351   KETONESUR negative 11/16/2020 1706   KETONESUR NEGATIVE 12/21/2018 1537   PROTEINUR negative 11/16/2020 1706   PROTEINUR NEGATIVE 12/21/2018 1537   UROBILINOGEN 0.2 11/16/2020 1706   UROBILINOGEN 0.2 02/23/2014 1538   NITRITE Positive (A) 11/16/2020 1706   NITRITE POSITIVE (A) 12/21/2018 1537   LEUKOCYTESUR Moderate (2+) (A) 11/16/2020 1706   LEUKOCYTESUR 2+ (A) 12/21/2018 1537    Radiological Exams on Admission: I have personally reviewed images DG Chest Portable 1 View  Result Date: 09/09/2021 CLINICAL DATA:  Shortness of breath. EXAM: PORTABLE CHEST 1 VIEW COMPARISON:  Radiograph 08/24/2021.  CT 08/19/2021 FINDINGS: Patient is rotated. Stable heart size. 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 Comparison with prior exam is difficult due to degree of rotation. Calcified anterior mediastinal lymph nodes. Emphysema, ill-defined opacity in the left  hemithorax, not definitively seen on prior. Resolved right infrahilar opacity from prior. Blunting of the costophrenic angles may be due to effusions or hyperinflation. No pneumothorax. Lower thoracic scoliosis. IMPRESSION: 1. Ill-defined opacity in the left hemithorax, may be infectious or asymmetric edema. 2. 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 Comparison with prior exam is difficult due to degree of rotation. 3. Resolved right infrahilar opacity. 4. Blunting of the costophrenic angles may be due to effusions or hyperinflation. Electronically Signed   By: Keith Rake M.D.   On: 09/09/2021 20:44    EKG: My personal interpretation of EKG shows: NSR    Assessment/Plan Principal Problem:   Acute respiratory failure with hypoxia (HCC) Active Problems:   Community acquired bilateral lower lobe pneumonia   Aortic arch aneurysm (HCC)   CLL (chronic lymphocytic leukemia) (HCC)   Thrombocytopenia (HCC)    Assessment and Plan: * Acute respiratory failure with hypoxia ([place]) Admit to med/surg bed. Continue with supplemental O2. Pt does not wear home O2.  Community acquired bilateral lower lobe pneumonia Pt initially on Zithromax for 5 days in beginning of August 2023. He took that for 5 days. Did not improve. Seen in ER and placed on levaquin but due to his hx of aortic arch aneurysm, this was changed to doxycycline. Pt completed 10 days course of this 5 days ago. Since stopping doxycycline, he has felt worse. Now hypoxic and has developed LLL pneumonia as well.  Given his immunosuppressed status with CLL, will continue IV Rocephin/zithromax. May need a full 14 days of po abx given his hx of CLL.  CLL (chronic lymphocytic leukemia) (HCC) Chronic. Follows with heme/onc.  Aortic arch aneurysm (HCC) Stable. Pt and wife states he has upcoming appointment with vascular surgery to schedule aortic arch repair.  Thrombocytopenia ([place]) New onset.  May be  due to his acute illness with pneumonia.  SCDs for prophylaxis.  No heparin or Lovenox.   DVT prophylaxis: SCDs Code Status: Full Code Family Communication: discussed with pt and wife jenny at bedside  Disposition Plan: return home  Consults called: none  Admission status: Observation, Med-Surg   Kristopher Oppenheim, DO [facility] 09/09/2021, 11:47 PM

## 2021-09-09 NOTE — ED Triage Notes (Addendum)
Continues to have a  cough ,SOB & chills over the weekend Here with wife  Pt took a bath at 5 pm & O2 sat dropped to 88% No home O2

## 2021-09-09 NOTE — Telephone Encounter (Signed)
Call by this RN to see if patient could come in earlier. Continues to have a cough & SOB. CVX ordered by Dr T on 09/05/21 was not done as of today. Pt states he did not feel well over the weekend

## 2021-09-09 NOTE — Assessment & Plan Note (Signed)
Admit to med/surg bed. Continue with supplemental O2. Pt does not wear home O2.

## 2021-09-09 NOTE — Assessment & Plan Note (Signed)
Stable. Pt and wife states he has upcoming appointment with vascular surgery to schedule aortic arch repair.

## 2021-09-09 NOTE — Assessment & Plan Note (Signed)
Pt initially on Zithromax for 5 days in beginning of August 2023. He took that for 5 days. Did not improve. Seen in ER and placed on levaquin but due to his hx of aortic arch aneurysm, this was changed to doxycycline. Pt completed 10 days course of this 5 days ago. Since stopping doxycycline, he has felt worse. Now hypoxic and has developed LLL pneumonia as well.  Given his immunosuppressed status with CLL, will continue IV Rocephin/zithromax. May need a full 14 days of po abx given his hx of CLL.

## 2021-09-09 NOTE — ED Triage Notes (Addendum)
Pt transferred to [place] from [place].  PT  has hx of AAA on the aortic arch and a recent scan suggested it was growing.  Pt has been battling PNA and has been SOB x5 days.  PT went to [facility] to get a chest xray b/c he cannot get surgery on AAA until PNA is resolved. PT 02 sat was 88% on RA at UC so he was tx here for further workup and treatment.   EMS vitals 118/85 95% 4L HR 75, oral temp 100.

## 2021-09-09 NOTE — Discharge Instructions (Addendum)
Patient transported via EMS to [facility] ED now for further evaluation of shortness of breath.

## 2021-09-09 NOTE — ED Notes (Signed)
[facility] called to transport pt to [facility] for fever, O2 sats 88-90% on RA & patient's co-morbidities. EMS en-route. Pt placed on 2 L O2 via  [place]

## 2021-09-09 NOTE — ED Notes (Signed)
Pt to CT at this time.

## 2021-09-10 DIAGNOSIS — J9601 Acute respiratory failure with hypoxia: Secondary | ICD-10-CM | POA: Diagnosis present

## 2021-09-10 DIAGNOSIS — J9690 Respiratory failure, unspecified, unspecified whether with hypoxia or hypercapnia: Secondary | ICD-10-CM | POA: Diagnosis not present

## 2021-09-10 DIAGNOSIS — E8809 Other disorders of plasma-protein metabolism, not elsewhere classified: Secondary | ICD-10-CM | POA: Diagnosis present

## 2021-09-10 DIAGNOSIS — E785 Hyperlipidemia, unspecified: Secondary | ICD-10-CM | POA: Diagnosis present

## 2021-09-10 DIAGNOSIS — Z825 Family history of asthma and other chronic lower respiratory diseases: Secondary | ICD-10-CM | POA: Diagnosis not present

## 2021-09-10 DIAGNOSIS — J439 Emphysema, unspecified: Secondary | ICD-10-CM | POA: Diagnosis present

## 2021-09-10 DIAGNOSIS — Z01810 Encounter for preprocedural cardiovascular examination: Secondary | ICD-10-CM | POA: Diagnosis not present

## 2021-09-10 DIAGNOSIS — Z20822 Contact with and (suspected) exposure to covid-19: Secondary | ICD-10-CM | POA: Diagnosis present

## 2021-09-10 DIAGNOSIS — D849 Immunodeficiency, unspecified: Secondary | ICD-10-CM | POA: Diagnosis present

## 2021-09-10 DIAGNOSIS — R0902 Hypoxemia: Secondary | ICD-10-CM | POA: Diagnosis not present

## 2021-09-10 DIAGNOSIS — R0602 Shortness of breath: Secondary | ICD-10-CM | POA: Diagnosis present

## 2021-09-10 DIAGNOSIS — E871 Hypo-osmolality and hyponatremia: Secondary | ICD-10-CM | POA: Diagnosis present

## 2021-09-10 DIAGNOSIS — Z8673 Personal history of transient ischemic attack (TIA), and cerebral infarction without residual deficits: Secondary | ICD-10-CM | POA: Diagnosis not present

## 2021-09-10 DIAGNOSIS — I7122 Aneurysm of the aortic arch, without rupture: Secondary | ICD-10-CM | POA: Diagnosis present

## 2021-09-10 DIAGNOSIS — Z8616 Personal history of COVID-19: Secondary | ICD-10-CM | POA: Diagnosis not present

## 2021-09-10 DIAGNOSIS — J189 Pneumonia, unspecified organism: Secondary | ICD-10-CM | POA: Diagnosis present

## 2021-09-10 DIAGNOSIS — R252 Cramp and spasm: Secondary | ICD-10-CM | POA: Diagnosis not present

## 2021-09-10 DIAGNOSIS — Z79899 Other long term (current) drug therapy: Secondary | ICD-10-CM | POA: Diagnosis not present

## 2021-09-10 DIAGNOSIS — J449 Chronic obstructive pulmonary disease, unspecified: Secondary | ICD-10-CM | POA: Diagnosis not present

## 2021-09-10 DIAGNOSIS — N400 Enlarged prostate without lower urinary tract symptoms: Secondary | ICD-10-CM | POA: Diagnosis present

## 2021-09-10 DIAGNOSIS — D696 Thrombocytopenia, unspecified: Secondary | ICD-10-CM | POA: Diagnosis present

## 2021-09-10 DIAGNOSIS — Z833 Family history of diabetes mellitus: Secondary | ICD-10-CM | POA: Diagnosis not present

## 2021-09-10 DIAGNOSIS — J9811 Atelectasis: Secondary | ICD-10-CM | POA: Diagnosis not present

## 2021-09-10 DIAGNOSIS — C9110 Chronic lymphocytic leukemia of B-cell type not having achieved remission: Secondary | ICD-10-CM | POA: Diagnosis present

## 2021-09-10 DIAGNOSIS — Z808 Family history of malignant neoplasm of other organs or systems: Secondary | ICD-10-CM | POA: Diagnosis not present

## 2021-09-10 DIAGNOSIS — Z87891 Personal history of nicotine dependence: Secondary | ICD-10-CM | POA: Diagnosis not present

## 2021-09-10 LAB — RESPIRATORY PANEL BY PCR

## 2021-09-10 LAB — CBC WITH DIFFERENTIAL/PLATELET
Abs Immature Granulocytes: 0 10*3/uL (ref 0.00–0.07)
Basophils Absolute: 0 10*3/uL (ref 0.0–0.1)
Basophils Relative: 0 %
Eosinophils Absolute: 0.9 10*3/uL — ABNORMAL HIGH (ref 0.0–0.5)
Eosinophils Relative: 4 %
HCT: 43 % (ref 39.0–52.0)
Hemoglobin: 14.2 g/dL (ref 13.0–17.0)
Lymphocytes Relative: 48 %
Lymphs Abs: 10.5 10*3/uL — ABNORMAL HIGH (ref 0.7–4.0)
MCH: 31.4 pg (ref 26.0–34.0)
MCHC: 33 g/dL (ref 30.0–36.0)
MCV: 95.1 fL (ref 80.0–100.0)
Monocytes Absolute: 0.2 10*3/uL (ref 0.1–1.0)
Monocytes Relative: 1 %
Neutro Abs: 10.3 10*3/uL — ABNORMAL HIGH (ref 1.7–7.7)
Neutrophils Relative %: 47 %
Platelets: 77 10*3/uL — ABNORMAL LOW (ref 150–400)
RBC: 4.52 MIL/uL (ref 4.22–5.81)
RDW: 13.6 % (ref 11.5–15.5)
WBC: 21.9 10*3/uL — ABNORMAL HIGH (ref 4.0–10.5)
nRBC: 0 /100 WBC
nRBC: 0.1 % (ref 0.0–0.2)

## 2021-09-10 LAB — COMPREHENSIVE METABOLIC PANEL
ALT: 18 U/L (ref 0–44)
AST: 22 U/L (ref 15–41)
Albumin: 3.2 g/dL — ABNORMAL LOW (ref 3.5–5.0)
Alkaline Phosphatase: 53 U/L (ref 38–126)
Anion gap: 9 (ref 5–15)
BUN: 10 mg/dL (ref 8–23)
CO2: 25 mmol/L (ref 22–32)
Calcium: 8.9 mg/dL (ref 8.9–10.3)
Chloride: 102 mmol/L (ref 98–111)
Creatinine, Ser: 1.03 mg/dL (ref 0.61–1.24)
GFR, Estimated: >60 mL/min (ref >60)
Glucose, Bld: 104 mg/dL — ABNORMAL HIGH (ref 70–99)
Potassium: 4.2 mmol/L (ref 3.5–5.1)
Sodium: 136 mmol/L (ref 135–145)
Total Bilirubin: 0.6 mg/dL (ref 0.3–1.2)
Total Protein: 5.9 g/dL — ABNORMAL LOW (ref 6.5–8.1)

## 2021-09-10 LAB — PROCALCITONIN: Procalcitonin: <0.1 ng/mL

## 2021-09-10 LAB — LACTIC ACID, PLASMA: Lactic Acid, Venous: 1.2 mmol/L (ref 0.5–1.9)

## 2021-09-10 LAB — MAGNESIUM: Magnesium: 1.9 mg/dL (ref 1.7–2.4)

## 2021-09-10 MED ORDER — MAGNESIUM SULFATE IN D5W 1-5 GM/100ML-% IV SOLN
1.0000 g | Freq: Once | INTRAVENOUS | Status: AC
  Administered 2021-09-10: 1 g via INTRAVENOUS
  Filled 2021-09-10: qty 100

## 2021-09-10 MED ORDER — UMECLIDINIUM BROMIDE 62.5 MCG/ACT IN AEPB
1.0000 | INHALATION_SPRAY | Freq: Every day | RESPIRATORY_TRACT | Status: DC
  Administered 2021-09-10 – 2021-09-13 (×3): 1 via RESPIRATORY_TRACT
  Filled 2021-09-10: qty 7

## 2021-09-10 MED ORDER — METHYLPREDNISOLONE SODIUM SUCC 40 MG IJ SOLR
40.0000 mg | Freq: Every day | INTRAMUSCULAR | Status: DC
  Administered 2021-09-10 – 2021-09-13 (×4): 40 mg via INTRAVENOUS
  Filled 2021-09-10 (×4): qty 1

## 2021-09-10 MED ORDER — MUSCLE RUB 10-15 % EX CREA
TOPICAL_CREAM | CUTANEOUS | Status: DC | PRN
  Filled 2021-09-10: qty 85

## 2021-09-10 MED ORDER — IBUPROFEN 200 MG PO TABS
400.0000 mg | ORAL_TABLET | Freq: Once | ORAL | Status: AC | PRN
  Administered 2021-09-10: 400 mg via ORAL
  Filled 2021-09-10: qty 2

## 2021-09-10 MED ORDER — TAMSULOSIN HCL 0.4 MG PO CAPS
0.4000 mg | ORAL_CAPSULE | Freq: Every day | ORAL | Status: DC
  Administered 2021-09-10 – 2021-09-13 (×4): 0.4 mg via ORAL
  Filled 2021-09-10 (×4): qty 1

## 2021-09-10 MED ORDER — GUAIFENESIN ER 600 MG PO TB12
1200.0000 mg | ORAL_TABLET | Freq: Two times a day (BID) | ORAL | Status: DC
  Administered 2021-09-10 – 2021-09-13 (×7): 1200 mg via ORAL
  Filled 2021-09-10 (×7): qty 2

## 2021-09-10 MED ORDER — ACETAMINOPHEN 650 MG RE SUPP: 650.0000 mg | Freq: Four times a day (QID) | RECTAL | Status: DC | PRN

## 2021-09-10 MED ORDER — ONDANSETRON HCL 4 MG/2ML IJ SOLN: 4.0000 mg | Freq: Four times a day (QID) | INTRAMUSCULAR | Status: DC | PRN

## 2021-09-10 MED ORDER — ACETAMINOPHEN 325 MG PO TABS
650.0000 mg | ORAL_TABLET | Freq: Four times a day (QID) | ORAL | Status: DC | PRN
  Administered 2021-09-10: 650 mg via ORAL
  Filled 2021-09-10: qty 2

## 2021-09-10 MED ORDER — AZITHROMYCIN 500 MG PO TABS
500.0000 mg | ORAL_TABLET | Freq: Every day | ORAL | Status: DC
  Administered 2021-09-10 – 2021-09-12 (×3): 500 mg via ORAL
  Filled 2021-09-10 (×5): qty 1

## 2021-09-10 MED ORDER — MOMETASONE FURO-FORMOTEROL FUM 100-5 MCG/ACT IN AERO
2.0000 | INHALATION_SPRAY | Freq: Two times a day (BID) | RESPIRATORY_TRACT | Status: DC
Start: 2021-09-10 — End: 2021-09-13
  Administered 2021-09-10 – 2021-09-13 (×5): 2 via RESPIRATORY_TRACT
  Filled 2021-09-10 (×2): qty 8.8

## 2021-09-10 MED ORDER — ALBUTEROL SULFATE (2.5 MG/3ML) 0.083% IN NEBU: 2.5000 mg | INHALATION_SOLUTION | RESPIRATORY_TRACT | Status: DC | PRN

## 2021-09-10 MED ORDER — SODIUM CHLORIDE 0.9 % IV SOLN: 2.0000 g | INTRAVENOUS | Status: DC

## 2021-09-10 MED ORDER — ONDANSETRON HCL 4 MG PO TABS: 4.0000 mg | ORAL_TABLET | Freq: Four times a day (QID) | ORAL | Status: DC | PRN

## 2021-09-10 MED ORDER — MELATONIN 5 MG PO TABS
10.0000 mg | ORAL_TABLET | Freq: Every evening | ORAL | Status: DC | PRN
  Administered 2021-09-10 – 2021-09-12 (×3): 10 mg via ORAL
  Filled 2021-09-10 (×3): qty 2

## 2021-09-10 MED ORDER — BUDESON-GLYCOPYRROL-FORMOTEROL 160-9-4.8 MCG/ACT IN AERO: 2.0000 | INHALATION_SPRAY | Freq: Two times a day (BID) | RESPIRATORY_TRACT | Status: DC

## 2021-09-10 MED ORDER — IBUPROFEN 400 MG PO TABS
400.0000 mg | ORAL_TABLET | Freq: Once | ORAL | Status: AC
  Administered 2021-09-10: 400 mg via ORAL
  Filled 2021-09-10: qty 1

## 2021-09-10 MED ORDER — SODIUM CHLORIDE 0.9 % IV SOLN
2.0000 g | INTRAVENOUS | Status: DC
  Administered 2021-09-10 – 2021-09-12 (×3): 2 g via INTRAVENOUS
  Filled 2021-09-10 (×3): qty 20

## 2021-09-10 MED ORDER — AZITHROMYCIN 250 MG PO TABS: 500.0000 mg | ORAL_TABLET | Freq: Every day | ORAL | Status: DC

## 2021-09-10 MED ORDER — SODIUM CHLORIDE 0.9 % IV SOLN
1.0000 g | Freq: Once | INTRAVENOUS | Status: AC
  Administered 2021-09-10: 1 g via INTRAVENOUS
  Filled 2021-09-10: qty 10

## 2021-09-10 NOTE — ED Notes (Signed)
Portable equipment contacted and will bring down [employer] equipment.

## 2021-09-10 NOTE — Progress Notes (Signed)
PROGRESS NOTE    Jason Abbott.  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  He was noted to have room air saturations of 80s on his home pulse oximetry and was initially seen at the beginning part of August for right-sided pneumonia and was treated with 5 days of p.o. azithromycin and he felt improved.  Subsequently he went to urgent care on 08/24/2021 and then was prescribed Levaquin however given his history of aortic aneurysm this was changed to doxycycline and he completed 10 days of the doxycycline and prednisone 5 days ago but since then he has had worsening shortness of breath and cough.  Has not had any recurrent fevers.  He has noted that with mild exertion that his O2 saturations dropped in the 80s and he has had a home pulse oximeter that he acquired when he had COVID 2 years ago.    In the ED he had a temperature of 100.3 and a heart rate of 79 a blood pressure of 132/81 and saturating 88% on room air.  He is started on supplemental oxygen and COVID testing was negative.  White blood cell count elevated 17.1 and BNP of 73.  CT scan of the chest was done to evaluate for PE and showed "No CT evidence of central acute pulmonary artery embolus. Left-sided aortic arch with aberrant right subclavian artery anatomy. No periaortic fluid collection. 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 Follow-up as per  recommendation of the prior CT with cardiothoracic surgery  consult. Aortic Atherosclerosis and Emphysema."  CT scan did mention "Diffuse interstitial thickening and haziness primarily involving the left lung common new since the prior CT and may represent edema or infiltrate. No consolidative changes. There is no pleural effusion pneumothorax. The central airways are patent."  Assessment and Plan: * Acute respiratory failure with hypoxia ([place]) -Admitted to med/surg bed as Observation.  -SpO2: 90 % O2 Flow Rate (L/min): 4 L/min -Continuous pulse oximetry maintain O2 saturation greater than 90% -Continue Dulera plus Incruse Ellipta nebs -Continue supplemental oxygen via nasal cannula wean oxygen to room air -We will need repeat chest x-ray in the a.m. and an ambulatory home O2 screen prior to discharge as the patient does not wear supplemental oxygen at home  Community acquired bilateral lower lobe pneumonia with underlying COPD -Pt initially on Zithromax for 5 days in beginning of August 2023. He took that for 5 days. Did not improve.  -Seen in Urgent Care  and placed on Levaquin but due to his hx of aortic arch aneurysm, this was changed to doxycycline. Pt completed 10 days course of this 5 days ago. Since stopping doxycycline, he has felt worse.  -Now hypoxic and has developed LLL pneumonia as well.   -Given his immunosuppressed status with CLL, will continue IV Rocephin/Azithromycin.  -May need a full 14 days of po abx given his hx of CLL. -See above -We will add guaifenesin 1200 mg p.o. twice daily, flutter valve and incentive spirometry -We will obtain Strep Urine  Ag and Legionella Pneumophilia Ur Ag -WBC went from 17.1 -> 21.9 -BNP was 73.1 -Lactic acid level was not elevated and was 1.1 and repeat was 1.2; procalcitonin level was less than 0.10 -We will try and have a sputum culture sent off -SARS-CoV-2 testing was negative -Blood cultures x2 are pending -Will involve Pulmonary given prolonged illness  CLL (chronic lymphocytic  leukemia) (HCC) -Chronic.  -Follows with Hematology/Oncology and will notify Dr. Marin Olp of patient's Hospitalization -Recent beta-2 microglobulin was 2.7 with an IgG level of 808, IgM level of 39, and an IgA level 149 back on 08/26/2021 -Patient's WBC has gone from 17.1 is now 21.9 while hospitalized this time -Continue monitor and trend and repeat CBC in a.m.  Aortic arch aneurysm (HCC) -Stable.  -Pt and wife states he has upcoming appointment with vascular surgery to schedule aortic arch repair. -Per CT Scan "Left-sided aortic arch with aberrant right subclavian artery anatomy. No periaortic fluid collection. 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 Follow-up as per recommendation of the prior CT with cardiothoracic surgery consult."  Hyponatremia -Patient's sodium went from 133 is now 136 -Continue monitor and trend and repeat CMP in a.m.  Hypoalbuminemia -Patient's albumin level went from 3.3 is now 3.2 Frequency monitor and trend and repeat CMP in a.m.  BPH -Continue with Tamsulosin 0.4 mg p.o. daily  Thrombocytopenia (HCC) -New onset.   -Patient's platelet count has been dropping since 821 and went from 146 -> 90 -> 77 -May be due to his acute illness with pneumonia.  -SCDs for prophylaxis.  No heparin or Lovenox currently given his continued dropping platelet count -Continue to monitor for signs and symptoms bleeding; no overt bleeding noted-repeat CBC in a.m.  Leg cramping -Check magnesium level and give a dose of IV mag sulfate 1 g times once -Patient tried mustard packets x2 and did not help -We will order ibuprofen 400 mg x 1 -Recommending stretching and massaging the area -Order Muscle Rub Cream and K Pad for the cramping   DVT prophylaxis: SCDs Start: 09/10/21 0313    Code Status: Full Code Family Communication: Discussed with wife at bedside   Disposition Plan:  Level of care: Med-Surg Status is: Observation The  patient will require care spanning > 2 midnights and should be moved to inpatient because: He remains Hypoxic and will nee Ambulatory Home O2 screen prior to safe discharge   Consultants:  Pulmonary Will notify Heme/Onc as a courtesy  Procedures:  CTA as above  Antimicrobials:  Anti-infectives (From admission, onward)    Start     Dose/Rate Route Frequency Ordered Stop   09/11/21 0000  cefTRIAXone (ROCEPHIN) 2 g in sodium chloride 0.9 % 100 mL IVPB        2 g 200 mL/hr over 30 Minutes Intravenous Every 24 hours 09/10/21 0316 09/14/21 2359   09/10/21 2200  azithromycin (ZITHROMAX) tablet 500 mg        500 mg Oral Daily at bedtime 09/10/21 0316     09/10/21 1000  azithromycin (ZITHROMAX) tablet 500 mg  Status:  Discontinued        500 mg Oral Daily 09/10/21 0312 09/10/21 0316   09/10/21 0330  cefTRIAXone (ROCEPHIN) 1 g in sodium chloride 0.9 % 100 mL IVPB        1 g 200 mL/hr over 30 Minutes Intravenous  Once 09/10/21 0321 09/10/21 0510   09/10/21 0315  cefTRIAXone (ROCEPHIN) 2 g in sodium chloride 0.9 %  100 mL IVPB  Status:  Discontinued        2 g 200 mL/hr over 30 Minutes Intravenous Every 24 hours 09/10/21 0312 09/10/21 0316   09/09/21 2230  cefTRIAXone (ROCEPHIN) 1 g in sodium chloride 0.9 % 100 mL IVPB        1 g 200 mL/hr over 30 Minutes Intravenous  Once 09/09/21 2221 09/10/21 0212   09/09/21 2230  azithromycin (ZITHROMAX) 500 mg in sodium chloride 0.9 % 250 mL IVPB        500 mg 250 mL/hr over 60 Minutes Intravenous  Once 09/09/21 2221 09/10/21 0359       Subjective: Seen and examined at bedside and he is having significant leg cramping causing him mild distress and agitation.  States that he cannot get comfortable and states that he gets cramping at home but never this bad.  Is on supplemental oxygen and states that his pneumonia has never really cleared up since the beginning of August.  His main complaint is his leg cramping which she feels was significantly bothersome.   No other concerns or complaints at this time.  Objective: Vitals:   09/10/21 0500 09/10/21 0600 09/10/21 0841 09/10/21 0910  BP: 134/87 (!) 140/83  127/81  Pulse: 68 71  83  Resp:  19  15  Temp: 98.3 F (36.8 C) 98 F (36.7 C) 98 F (36.7 C)   TempSrc:   Oral   SpO2: 95% 97%  90%    Intake/Output Summary (Last 24 hours) at 09/10/2021 1008 Last data filed at 09/10/2021 0359 Gross per 24 hour  Intake 250 ml  Output --  Net 250 ml   There were no vitals filed for this visit.  Examination: Physical Exam:  Constitutional: WN/WD Caucasian male currently in some distress due to his bilateral leg cramping  Respiratory: Diminished to auscultation bilaterally with coarse breath sounds and some rhonchi, no wheezing, rales, or crackles. Normal respiratory effort and patient is not tachypenic. No accessory muscle use.  Wearing supplemental oxygen via nasal cannula at 4 L Cardiovascular: RRR, no murmurs / rubs / gallops. S1 and S2 auscultated.  Minimal edema Abdomen: Soft, non-tender, slightly distended secondary to body habitus. Bowel sounds positive.  GU: Deferred. Musculoskeletal: No clubbing / cyanosis of digits/nails. No joint deformity upper and lower extremities but legs are cramping actively Skin: No rashes, lesions, ulcers limited skin evaluation. No induration; Warm and dry.  Neurologic: CN 2-12 grossly intact with no focal deficits. Romberg sign and cerebellar reflexes not assessed.  Psychiatric: Normal judgment and insight. Alert and oriented x 3. Slightly agitated mood  Data Reviewed: I have personally reviewed following labs and imaging studies  CBC: Recent Labs  Lab 09/09/21 2104 09/10/21 0417  WBC 17.1* 21.9*  NEUTROABS 7.4 10.3*  HGB 14.0 14.2  HCT 42.8 43.0  MCV 94.9 95.1  PLT 90* 77*   Basic Metabolic Panel: Recent Labs  Lab 09/09/21 2104 09/10/21 0417  NA 133* 136  K 3.9 4.2  CL 100 102  CO2 23 25  GLUCOSE 105* 104*  BUN 12 10  CREATININE 1.06 1.03   CALCIUM 8.6* 8.9   GFR: Estimated Creatinine Clearance: 63 mL/min (by C-G formula based on SCr of 1.03 mg/dL). Liver Function Tests: Recent Labs  Lab 09/09/21 2104 09/10/21 0417  AST 21 22  ALT 19 18  ALKPHOS 62 53  BILITOT 1.0 0.6  PROT 5.5* 5.9*  ALBUMIN 3.3* 3.2*   No results for input(s): "LIPASE", "AMYLASE" in the  last 168 hours. No results for input(s): "AMMONIA" in the last 168 hours. Coagulation Profile: No results for input(s): "INR", "PROTIME" in the last 168 hours. Cardiac Enzymes: No results for input(s): "CKTOTAL", "CKMB", "CKMBINDEX", "TROPONINI" in the last 168 hours. BNP (last 3 results) No results for input(s): "PROBNP" in the last 8760 hours. HbA1C: No results for input(s): "HGBA1C" in the last 72 hours. CBG: No results for input(s): "GLUCAP" in the last 168 hours. Lipid Profile: No results for input(s): "CHOL", "HDL", "LDLCALC", "TRIG", "CHOLHDL", "LDLDIRECT" in the last 72 hours. Thyroid Function Tests: No results for input(s): "TSH", "T4TOTAL", "FREET4", "T3FREE", "THYROIDAB" in the last 72 hours. Anemia Panel: No results for input(s): "VITAMINB12", "FOLATE", "FERRITIN", "TIBC", "IRON", "RETICCTPCT" in the last 72 hours. Sepsis Labs: Recent Labs  Lab 09/09/21 2104 09/10/21 0417  PROCALCITON  --  <0.10  LATICACIDVEN 1.1 1.2    Recent Results (from the past 240 hour(s))  SARS Coronavirus 2 by RT PCR (hospital order, performed in [facility] hospital lab) *cepheid single result test*     Status: None   Collection Time: 09/09/21  8:08 PM   Specimen: Nasal Swab  Result Value Ref Range Status   SARS Coronavirus 2 by RT PCR NEGATIVE NEGATIVE Final    Comment: (NOTE) SARS-CoV-2 target nucleic acids are NOT DETECTED.  The SARS-CoV-2 RNA is generally detectable in upper and lower respiratory specimens during the acute phase of infection. The lowest concentration of SARS-CoV-2 viral copies this assay can detect is 250 copies / mL. A negative result  does not preclude SARS-CoV-2 infection and should not be used as the sole basis for treatment or other patient management decisions.  A negative result may occur with improper specimen collection / handling, submission of specimen other than nasopharyngeal swab, presence of viral mutation(s) within the areas targeted by this assay, and inadequate number of viral copies (<250 copies / mL). A negative result must be combined with clinical observations, patient history, and epidemiological information.  Fact Sheet for Patients:   https://www.patel.info/  Fact Sheet for Healthcare Providers: https://hall.com/  This test is not yet approved or  cleared by the [place] FDA and has been authorized for detection and/or diagnosis of SARS-CoV-2 by FDA under an Emergency Use Authorization (EUA).  This EUA will remain in effect (meaning this test can be used) for the duration of the COVID-19 declaration under Section 564(b)(1) of the Act, 21 U.S.C. section 360bbb-3(b)(1), unless the authorization is terminated or revoked sooner.  Performed at [facility], [place] [address], [place], [place] [address]   Culture, blood (routine x 2)     Status: None (Preliminary result)   Collection Time: 09/09/21  9:04 PM   Specimen: BLOOD LEFT FOREARM  Result Value Ref Range Status   Specimen Description BLOOD LEFT FOREARM  Final   Special Requests   Final    BOTTLES DRAWN AEROBIC AND ANAEROBIC Blood Culture adequate volume   Culture   Final    NO GROWTH < 12 HOURS Performed at [facility], [place] [address], [place], [place] [address]    Report Status PENDING  Incomplete    Radiology Studies: CT Angio Chest PE W and/or Wo Contrast  Result Date: 09/09/2021 CLINICAL DATA:  Positive D-dimer.  Concern for pulmonary embolism. EXAM: CT ANGIOGRAPHY CHEST WITH CONTRAST TECHNIQUE: Multidetector CT imaging of the chest was performed using the standard  protocol during bolus administration of intravenous contrast. Multiplanar CT image reconstructions and MIPs were obtained to evaluate  the vascular anatomy. RADIATION DOSE REDUCTION: This exam was performed according to the departmental dose-optimization program which includes automated exposure control, adjustment of the mA and/or kV according to patient size and/or use of iterative reconstruction technique. CONTRAST:  39m OMNIPAQUE IOHEXOL 350 MG/ML SOLN COMPARISON:  Chest CT dated 08/19/2021. Chest radiograph dated 09/10/2018. FINDINGS: Evaluation of this exam is limited due to respiratory motion artifact. Cardiovascular: Mild cardiomegaly. No pericardial effusion. There is coronary vascular calcification. Moderate atherosclerotic calcification of the thoracic aorta. 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 Follow-up as per recommendation of the prior CT with cardiothoracic surgery consult. There is a left-sided aortic arch with aberrant right subclavian artery anatomy. No periaortic fluid collection. No extravasation of contrast. Evaluation of the pulmonary arteries is limited due to respiratory motion. No central acute pulmonary artery embolus identified. Mediastinum/Nodes: Top-normal left hilar lymph node measures 1 cm short axis. No mediastinal adenopathy. The esophagus is grossly unremarkable. No mediastinal fluid collection. Lungs/Pleura: Background of emphysema. Diffuse interstitial thickening and haziness primarily involving the left lung common new since the prior CT and may represent edema or infiltrate. No consolidative changes. There is no pleural effusion pneumothorax. The central airways are patent. Upper Abdomen: Multiple hepatic hypodense lesions, suboptimally characterized, likely cysts. Musculoskeletal: No acute osseous pathology.  Degenerative changes. Review of the MIP images confirms the above findings. IMPRESSION: 1. No CT evidence of central acute  pulmonary artery embolus. 2. Left-sided aortic arch with aberrant right subclavian artery anatomy. No periaortic fluid collection. 3. 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 Follow-up as per recommendation of the prior CT with cardiothoracic surgery consult. 4. Aortic Atherosclerosis (ICD10-I70.0) and Emphysema (ICD10-J43.9). Electronically Signed   By: AAnner CreteM.D.   On: 09/09/2021 23:58   DG Chest Portable 1 View  Result Date: 09/09/2021 CLINICAL DATA:  Shortness of breath. EXAM: PORTABLE CHEST 1 VIEW COMPARISON:  Radiograph 08/24/2021.  CT 08/19/2021 FINDINGS: Patient is rotated. Stable heart size. 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 Comparison with prior exam is difficult due to degree of rotation. Calcified anterior mediastinal lymph nodes. Emphysema, ill-defined opacity in the left hemithorax, not definitively seen on prior. Resolved right infrahilar opacity from prior. Blunting of the costophrenic angles may be due to effusions or hyperinflation. No pneumothorax. Lower thoracic scoliosis. IMPRESSION: 1. Ill-defined opacity in the left hemithorax, may be infectious or asymmetric edema. 2. 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 Comparison with prior exam is difficult due to degree of rotation. 3. Resolved right infrahilar opacity. 4. Blunting of the costophrenic angles may be due to effusions or hyperinflation. Electronically Signed   By: MKeith RakeM.D.   On: 09/09/2021 20:44    Scheduled Meds:  azithromycin  500 mg Oral QHS   mometasone-formoterol  2 puff Inhalation BID   And   umeclidinium bromide  1 puff Inhalation Daily   tamsulosin  0.4 mg Oral Daily   Continuous Infusions:  [START ON 09/11/2021] cefTRIAXone (ROCEPHIN)  IV     magnesium sulfate bolus IVPB 1 g (09/10/21 0954)    LOS: 0 days   ORaiford Noble DO [facility] Available  via Epic secure chat 7am-7pm After these hours, please refer to coverage provider listed on [email or web address] 09/10/2021, 10:08 AM

## 2021-09-10 NOTE — ED Notes (Addendum)
Patient having no more muscle cramps at this time. Patient resting comfortably.

## 2021-09-10 NOTE — Consult Note (Addendum)
NAME:  Jason Abbott., MRN:  [id number], DOB:  Jul 05, 1945, LOS: 0 ADMISSION DATE:  09/09/2021, CONSULTATION DATE:  9/5 REFERRING MD:  Dr. Alfredia Ferguson, CHIEF COMPLAINT:  recurrent pneumonai   History of Present Illness:  Patient is 76 year old male with pertinent PMH of CLL, aortic arch aneurysm, BPH [facility] on 9/4 with hypoxic respiratory failure.  Patient recently seen in early August 14, 2021 for right-sided pneumonia.  Was treated with Zithromax for 5 days.  On 8/19 patient failed to improve and went to urgent care.  Antibiotics broadened to Doxy for 10 days with 5-day steroid taper. Patient's symptoms much improved on abx. Since stopping abx patient started having worsening dyspnea.  On 9/4 patient was having progressively worsening sob.  Was checking sats at home on room air noted to be in 37s.  Denies fevers.  Came to [facility] ED for further eval.  Patient admitted to [facility] on 9/4.  Noted to be febrile 100.3 F.  On room air with sats 88%.  Was started on supplemental O2.  COVID/flu negative.  WBC 17.1.  BNP 73.  CXR showing opacity in left hemothorax.  CTA chest showing no PE; B/L lower lobe pneumonia (L greater than R).  Urine Legionella/strep pending. PCT <0.10. PCCM consulted for recurrent pneumonia.  Pertinent  Medical History   Past Medical History:  Diagnosis Date   Bowel obstruction ([place])    Diverticul disease small and large intestine, no perforati or abscess    H/O urinary infection    Shingles outbreak 10/03/2013   Stroke ([place]) 01/2019     Significant Hospital Events: Including procedures, antibiotic start and stop dates in addition to other pertinent events   9/4: admitted with recurrent pneumonia 9/5: pccm consulted  Interim History / Subjective:  On 4L [place]; sats mid 90s Denies chest pain Some sob that's worse with exertion  Objective   Blood pressure 127/81, pulse 83, temperature 98 F (36.7 C), temperature source Oral, resp. rate 15, SpO2 90 %.         Intake/Output Summary (Last 24 hours) at 09/10/2021 1039 Last data filed at 09/10/2021 0359 Gross per 24 hour  Intake 250 ml  Output --  Net 250 ml   There were no vitals filed for this visit.  Examination: General:  mild resp distress HEENT: MM pink/moist; [place] in place Neuro: Aox3; MAE CV: s1s2, RRR, no m/r/g PULM:  dim clear BS bilaterally; [place] 4L GI: soft, bsx4 active  Extremities: warm/dry, no  ble edema  Skin: no rashes or lesions appreciated   Resolved Hospital Problem list     Assessment & Plan:  Acute respiratory failure with hypoxia Recurrent CAP COPD: Seen by Dr. Daphene Calamity outpatient P: -Continue nasal cannula and wean for sats greater than 90% -continue dulera and incruse ellipta -prn albuterol for wheezing -continue azithro/rocephin for cap ppx -f/u expectorated sputum -urine legionella/strep pending -send resp viral panel -pulm toiletry: IS/flutter -OOB as tolerated; PT/OT  CLL Aortic arch aneurysm Hyponatremia Hypoalbuminemia BPH Thrombocytopenia Leg cramping P: -Per primary   Best Practice (right click and "Reselect all SmartList Selections" daily)   Per primary  Labs   CBC: Recent Labs  Lab 09/09/21 2104 09/10/21 0417  WBC 17.1* 21.9*  NEUTROABS 7.4 10.3*  HGB 14.0 14.2  HCT 42.8 43.0  MCV 94.9 95.1  PLT 90* 77*    Basic Metabolic Panel: Recent Labs  Lab 09/09/21 2104 09/10/21 0417  NA 133* 136  K 3.9 4.2  CL 100  102  CO2 23 25  GLUCOSE 105* 104*  BUN 12 10  CREATININE 1.06 1.03  CALCIUM 8.6* 8.9   GFR: Estimated Creatinine Clearance: 63 mL/min (by C-G formula based on SCr of 1.03 mg/dL). Recent Labs  Lab 09/09/21 2104 09/10/21 0417  PROCALCITON  --  <0.10  WBC 17.1* 21.9*  LATICACIDVEN 1.1 1.2    Liver Function Tests: Recent Labs  Lab 09/09/21 2104 09/10/21 0417  AST 21 22  ALT 19 18  ALKPHOS 62 53  BILITOT 1.0 0.6  PROT 5.5* 5.9*  ALBUMIN 3.3* 3.2*   No results for input(s): "LIPASE", "AMYLASE" in the  last 168 hours. No results for input(s): "AMMONIA" in the last 168 hours.  ABG No results found for: "PHART", "PCO2ART", "PO2ART", "HCO3", "TCO2", "ACIDBASEDEF", "O2SAT"   Coagulation Profile: No results for input(s): "INR", "PROTIME" in the last 168 hours.  Cardiac Enzymes: No results for input(s): "CKTOTAL", "CKMB", "CKMBINDEX", "TROPONINI" in the last 168 hours.  HbA1C: No results found for: "HGBA1C"  CBG: No results for input(s): "GLUCAP" in the last 168 hours.  Review of Systems:   Review of Systems  Constitutional:  Negative for chills and fever.  Respiratory:  Positive for cough and shortness of breath. Negative for sputum production and wheezing.   Cardiovascular:  Negative for chest pain and leg swelling.  Gastrointestinal:  Negative for nausea and vomiting.     Past Medical History:  He,  has a past medical history of Bowel obstruction ([place]), Diverticul disease small and large intestine, no perforati or abscess, H/O urinary infection, Shingles outbreak (10/03/2013), and Stroke ([place]) (01/2019).   Surgical History:   Past Surgical History:  Procedure Laterality Date   HERNIA REPAIR     MECKEL DIVERTICULUM EXCISION     ORIF FINGER / THUMB FRACTURE       Social History:   reports that he quit smoking about 33 years ago. His smoking use included cigarettes. He has a 11.50 pack-year smoking history. He has never used smokeless tobacco. He reports that he does not currently use alcohol. He reports that he does not use drugs.   Family History:  His family history includes COPD in his father; Cancer in his mother; Diabetes in his brother, brother, and sister.   Allergies Allergies  Allergen Reactions   Levaquin [Levofloxacin] Other (See Comments)    Due to medical hx     Home Medications  Prior to Admission medications   Medication Sig Start Date End Date Taking? Authorizing Provider  Budeson-Glycopyrrol-Formoterol (BREZTRI AEROSPHERE) 160-9-4.8 MCG/ACT AERO  Inhale 2 puffs into the lungs 2 (two) times daily. 08/12/21  Yes Silverio Decamp, MD  Magnesium Citrate 200 MG TABS Take 400 mg by mouth daily.   Yes [provider]  tamsulosin (FLOMAX) 0.4 MG CAPS capsule Take 0.4 mg by mouth daily. 08/20/21  Yes [provider]  pantoprazole (PROTONIX) 40 MG tablet Take 40 mg by mouth daily. Patient not taking: Reported on 09/10/2021 08/20/21   [provider]          JD Rexene Agent [facility] 09/10/2021, 10:39 AM  Please see [email or web address] for pager details.  From 7A-7P if no response, please call [phone]. After hours, please call ELink [phone].

## 2021-09-10 NOTE — ED Notes (Signed)
Pt given heating pads for leg pain.

## 2021-09-10 NOTE — ED Notes (Signed)
Patient complaining of painful leg cramps. Patient asked for mustard and he was provided with two packets. Patient ate both with no relief. MD made aware.

## 2021-09-11 ENCOUNTER — Inpatient Hospital Stay (HOSPITAL_COMMUNITY): Payer: Medicare Other

## 2021-09-11 DIAGNOSIS — J189 Pneumonia, unspecified organism: Secondary | ICD-10-CM | POA: Diagnosis not present

## 2021-09-11 DIAGNOSIS — J9601 Acute respiratory failure with hypoxia: Secondary | ICD-10-CM

## 2021-09-11 DIAGNOSIS — I7122 Aneurysm of the aortic arch, without rupture: Secondary | ICD-10-CM

## 2021-09-11 DIAGNOSIS — C9110 Chronic lymphocytic leukemia of B-cell type not having achieved remission: Secondary | ICD-10-CM | POA: Diagnosis not present

## 2021-09-11 LAB — CBC WITH DIFFERENTIAL/PLATELET
Abs Immature Granulocytes: 0.03 10*3/uL (ref 0.00–0.07)
Basophils Absolute: 0 10*3/uL (ref 0.0–0.1)
Basophils Relative: 0 %
Eosinophils Absolute: 0 10*3/uL (ref 0.0–0.5)
Eosinophils Relative: 0 %
HCT: 40.3 % (ref 39.0–52.0)
Hemoglobin: 13.8 g/dL (ref 13.0–17.0)
Immature Granulocytes: 0 %
Lymphocytes Relative: 49 %
Lymphs Abs: 5.5 10*3/uL — ABNORMAL HIGH (ref 0.7–4.0)
MCH: 31.5 pg (ref 26.0–34.0)
MCHC: 34.2 g/dL (ref 30.0–36.0)
MCV: 92 fL (ref 80.0–100.0)
Monocytes Absolute: 0.9 10*3/uL (ref 0.1–1.0)
Monocytes Relative: 8 %
Neutro Abs: 4.8 10*3/uL (ref 1.7–7.7)
Neutrophils Relative %: 43 %
Platelets: 90 10*3/uL — ABNORMAL LOW (ref 150–400)
RBC: 4.38 MIL/uL (ref 4.22–5.81)
RDW: 13.2 % (ref 11.5–15.5)
WBC: 11.2 10*3/uL — ABNORMAL HIGH (ref 4.0–10.5)
nRBC: 0 % (ref 0.0–0.2)

## 2021-09-11 LAB — COMPREHENSIVE METABOLIC PANEL
ALT: 17 U/L (ref 0–44)
AST: 18 U/L (ref 15–41)
Albumin: 2.8 g/dL — ABNORMAL LOW (ref 3.5–5.0)
Alkaline Phosphatase: 58 U/L (ref 38–126)
Anion gap: 7 (ref 5–15)
BUN: 15 mg/dL (ref 8–23)
CO2: 25 mmol/L (ref 22–32)
Calcium: 9.2 mg/dL (ref 8.9–10.3)
Chloride: 104 mmol/L (ref 98–111)
Creatinine, Ser: 0.94 mg/dL (ref 0.61–1.24)
GFR, Estimated: >60 mL/min (ref >60)
Glucose, Bld: 148 mg/dL — ABNORMAL HIGH (ref 70–99)
Potassium: 4.4 mmol/L (ref 3.5–5.1)
Sodium: 136 mmol/L (ref 135–145)
Total Bilirubin: 0.7 mg/dL (ref 0.3–1.2)
Total Protein: 5.3 g/dL — ABNORMAL LOW (ref 6.5–8.1)

## 2021-09-11 LAB — ECHOCARDIOGRAM COMPLETE
AR max vel: 2.67 cm2
AV Area VTI: 2.92 cm2
AV Area mean vel: 2.58 cm2
AV Mean grad: 3.5 mmHg
AV Peak grad: 5.6 mmHg
Ao pk vel: 1.19 m/s
Area-P 1/2: 6.07 cm2
Calc EF: 59.1 %
Height: 71 in
MV M vel: 1.7 m/s
MV Peak grad: 11.6 mmHg
S' Lateral: 3.6 cm
Single Plane A2C EF: 56.9 %
Single Plane A4C EF: 55.8 %
Weight: 2857.16 oz

## 2021-09-11 LAB — STREP PNEUMONIAE URINARY ANTIGEN: Strep Pneumo Urinary Antigen: NEGATIVE

## 2021-09-11 LAB — PHOSPHORUS: Phosphorus: 4.4 mg/dL (ref 2.5–4.6)

## 2021-09-11 LAB — MAGNESIUM: Magnesium: 2 mg/dL (ref 1.7–2.4)

## 2021-09-11 NOTE — Evaluation (Signed)
Physical Therapy Evaluation Patient Details Name: Jason Abbott. MRN: [id number] DOB: 1945-01-28 Today's Date: 09/11/2021  History of Present Illness  Pt is a 76 year old man admitted on 09/09/21 with cough, hypoxia, SOB, fever. Positive for B lower lobe PNA and COPD. Hospital course complicated by leg cramps, PMH: CLL, aortic arch aneurysm, stroke, COVID 19 hospitalization 03/2019, HLD.  Clinical Impression   Pt presents with impaired activity tolerance, SpO2 drop during mobility (see below). Pt to benefit from acute PT to address deficits. Pt ambulated short hallway distance without AD but at slowed pace, DOE 2/4 on RA and recovered with rest and 2LO2. Pt not at mobility baseline, but PT anticipates pt will progress well acutely. PT to progress mobility as tolerated, and will continue to follow acutely.   SpO2 on RA gait: 86% SpO2 after 2LO2 donned: 91%        Recommendations for follow up therapy are one component of a multi-disciplinary discharge planning process, led by the attending physician.  Recommendations may be updated based on patient status, additional functional criteria and insurance authorization.  Follow Up Recommendations No PT follow up      Assistance Recommended at Discharge PRN  Patient can return home with the following       Equipment Recommendations None recommended by PT  Recommendations for Other Services       Functional Status Assessment Patient has had a recent decline in their functional status and demonstrates the ability to make significant improvements in function in a reasonable and predictable amount of time.     Precautions / Restrictions Precautions Precautions: Other (comment) Precaution Comments: watch 02 Restrictions Weight Bearing Restrictions: No      Mobility  Bed Mobility Overal bed mobility: Modified Independent                  Transfers Overall transfer level: Modified independent                       Ambulation/Gait Ambulation/Gait assistance: Modified independent (Device/Increase time) Gait Distance (Feet): 100 Feet Assistive device: None Gait Pattern/deviations: Step-through pattern, Decreased stride length Gait velocity: decr     General Gait Details: increased time vs baseline, otherwise WFL. SPO2 drop to 86% on RA, requiring 2LO2 to recover >88%  Stairs            Wheelchair Mobility    Modified Rankin (Stroke Patients Only)       Balance Overall balance assessment: Mild deficits observed, not formally tested                                           Pertinent Vitals/Pain Pain Assessment Pain Assessment: No/denies pain    Home Living Family/patient expects to be discharged to:: Private residence Living Arrangements: Spouse/significant other Available Help at Discharge: Family;Available 24 hours/day Type of Home: House Home Access: Stairs to enter   [employer] of Steps: flight   Home Layout: Two level Home Equipment: None      Prior Function Prior Level of Function : Independent/Modified Independent;Driving                     Hand Dominance   Dominant Hand: Right    Extremity/Trunk Assessment   Upper Extremity Assessment Upper Extremity Assessment: Defer to OT evaluation    Lower Extremity Assessment Lower Extremity  Assessment: Overall WFL for tasks assessed    Cervical / Trunk Assessment Cervical / Trunk Assessment: Normal  Communication   Communication: No difficulties  Cognition Arousal/Alertness: Awake/alert Behavior During Therapy: WFL for tasks assessed/performed Overall Cognitive Status: Within Functional Limits for tasks assessed                                 General Comments: likes to joke        General Comments      Exercises     Assessment/Plan    PT Assessment Patient needs continued PT services  PT Problem List Decreased mobility;Decreased activity  tolerance;Pain       PT Treatment Interventions Therapeutic activities;Gait training;Therapeutic exercise;Patient/family education;Balance training;Functional mobility training;Neuromuscular re-education    PT Goals (Current goals can be found in the Care Plan section)  Acute Rehab PT Goals Patient Stated Goal: home PT Goal Formulation: With patient Time For Goal Achievement: 09/25/21 Potential to Achieve Goals: Good    Frequency Min 3X/week     Co-evaluation               AM-PAC PT "6 Clicks" Mobility  Outcome Measure Help needed turning from your back to your side while in a flat bed without using bedrails?: None Help needed moving from lying on your back to sitting on the side of a flat bed without using bedrails?: None Help needed moving to and from a bed to a chair (including a wheelchair)?: None Help needed standing up from a chair using your arms (e.g., wheelchair or bedside chair)?: None Help needed to walk in hospital room?: A Little Help needed climbing 3-5 steps with a railing? : A Little 6 Click Score: 22    End of Session Equipment Utilized During Treatment: Oxygen Activity Tolerance: Patient tolerated treatment well;Patient limited by fatigue Patient left: in bed;with call bell/phone within reach;with family/visitor present Nurse Communication: Mobility status PT Visit Diagnosis: Other abnormalities of gait and mobility (R26.89)    Time: 1005-1021 (overlap with OT) PT Time Calculation (min) (ACUTE ONLY): 16 min   Charges:   PT Evaluation $PT Eval Low Complexity: 1 Low         Albin Duckett S, PT DPT Acute Rehabilitation Services Pager [phone]  Office [phone]   Roxine Caddy E Ruffin Pyo 09/11/2021, 10:57 AM

## 2021-09-11 NOTE — Progress Notes (Signed)
PROGRESS NOTE    Jason Abbott.  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  He was noted to have room air saturations of 80s on his home pulse oximetry and was initially seen at the beginning part of August for right-sided pneumonia and was treated with 5 days of p.o. azithromycin and he felt improved.  Subsequently he went to urgent care on 08/24/2021 and then was prescribed Levaquin however given his history of aortic aneurysm this was changed to doxycycline and he completed 10 days of the doxycycline and prednisone 5 days ago but since then he has had worsening shortness of breath and cough.  Has not had any recurrent fevers.  He has noted that with mild exertion that his O2 saturations dropped in the 80s and he has had a home pulse oximeter that he acquired when he had COVID 2 years ago.     In the ED he had a temperature of 100.3 and a heart rate of 79 a blood pressure of 132/81 and saturating 88% on room air.  He is started on supplemental oxygen and COVID testing was negative.  White blood cell count elevated 17.1 and BNP of 73.  CT scan of the chest was done to evaluate for PE and showed "No CT evidence of central acute pulmonary artery embolus. Left-sided aortic arch with aberrant right subclavian artery anatomy. No periaortic fluid collection. 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 Follow-up as per  recommendation of the prior CT with cardiothoracic surgery  consult. Aortic Atherosclerosis and Emphysema."   CT scan did mention "Diffuse interstitial thickening and haziness primarily involving the left lung common new since the prior CT and may represent edema or infiltrate. No consolidative changes. There is no pleural effusion pneumothorax.The central airways are patent."  Pulmonary Evaluated and felt that his right lower lobe streaky infiltrate was more likely atelectasis.  His respiratory virus panel was negative.  Pulmonary recommends continuing IV ceftriaxone azithromycin and steroids empirically starting him on steroids setting of Solu-Medrol 40 mg IV for 10 days and then transitioning to oral prednisone milligrams daily tapering by 10 mg every 5 days to wean off.  Assessment and Plan: * Acute respiratory failure with hypoxia ([place]) -Admitted to med/surg bed as Observation.  -SpO2: 92 % O2 Flow Rate (L/min): 2 L/min; Continue weaning  -Continuous pulse oximetry maintain O2 saturation greater than 90% -Continue Dulera plus Incruse Ellipta nebs -Continue supplemental oxygen via nasal cannula wean oxygen to room air -Repeat chest x-ray in the a.m. and an ambulatory home O2 screen prior to discharge as the patient does not wear supplemental oxygen at home -See Below    Community Acquired Bilateral lower lobe pneumonia with underlying COPD -Pt initially on Zithromax for 5 days in beginning of August 2023. He took that for 5 days. Did not improve.  -Seen in Urgent Care  and placed on Levaquin but due to his hx of aortic arch aneurysm, this was changed to doxycycline. Pt completed 10 days course of  this 5 days ago. Since stopping doxycycline, he has felt worse.  -Now hypoxic and has developed LLL pneumonia as well.   -Given his immunosuppressed status with CLL, will continue IV Rocephin/Azithromycin.  -May need a full 14 days of po abx given his hx of CLL. -See above -We will add guaifenesin 1200 mg p.o. twice daily, flutter valve and incentive  spirometry -We will obtain Strep Urine Ag and Legionella Pneumophilia Ur Ag -WBC went from 17.1 -> 21.9 -> 11.2 -BNP was 73.1 -Lactic acid level was not elevated and was 1.1 and repeat was 1.2; procalcitonin level was less than 0.10 -We will try and have a sputum culture sent off -SARS-CoV-2 testing was negative -Blood cultures x2 show NGTD at 1 Day -Will involve Pulmonary given prolonged illness and they are recommending continuing empiric antibiotics with IV azithromycin and ceftriaxone.  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  -Follows with Hematology/Oncology and will notify Dr. Marin Olp of patient's Hospitalization -Recent beta-2 microglobulin was 2.7 with an IgG level of 808, IgM level of 39, and an IgA level 149 back on 08/26/2021 -Patient's WBC has gone from 17.1 -> 21.9 -> 11.2 -Continue monitor and trend and repeat CBC in a.m.   Aortic arch aneurysm ([facility]) -Stable.  -Pt and wife states he has upcoming appointment with vascular surgery to schedule aortic arch repair. -Per CT Scan "Left-sided aortic arch with aberrant right subclavian artery anatomy. No periaortic fluid collection. 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 Follow-up as per recommendation of the prior CT with cardiothoracic surgery consult." -Cardiothoracic surgery is ordering an echocardiogram for this patient -Scheduled for Aortic Arch Surgery in October   Hyponatremia -Patient's sodium went from 133 is  now 136 x2 -Continue monitor and trend and repeat CMP in a.m.   Hypoalbuminemia -Patient's albumin level went from 3.3 -> 3.2 -> 2.8 Frequency monitor and trend and repeat CMP in a.m.   BPH -Continue with Tamsulosin 0.4 mg p.o. daily   Thrombocytopenia ([facility]CC) -New onset.   -Patient's platelet count has been dropping since 8/21 and went from 146 -> 90 -> 77 and is now improved to 90 -May be due to his acute illness with pneumonia.   -SCDs for prophylaxis.  No heparin or Lovenox currently given his continued dropping platelet count -Continue to monitor for signs and symptoms bleeding; no overt bleeding noted-repeat CBC in a.m.   Leg cramping, improved -Check magnesium level and give a dose of IV mag sulfate 1 g times once -Patient tried mustard packets x2 and did not help -We will order ibuprofen 400 mg x 1 -Recommending stretching and massaging the area -Order Muscle Rub Cream and K Pad for the cramping    DVT prophylaxis: SCDs Start: 09/10/21 0313    Code Status: Full Code Family Communication: Discussed with wife at bedside  Disposition Plan:  Level of care: Med-Surg Status is: Inpatient Remains inpatient appropriate because: Needs further clinical improvement and clearance by pulmonary  Consultants:  Pulmonary  Procedures:  As above echocardiogram  is being ordered  Antimicrobials:  Anti-infectives (From admission, onward)    Start     Dose/Rate Route Frequency Ordered Stop   09/11/21 0000  cefTRIAXone (ROCEPHIN) 2 g in sodium chloride 0.9 % 100 mL IVPB        2 g 200 mL/hr over 30 Minutes Intravenous Every 24 hours 09/10/21 0316 09/14/21 2359   09/10/21 2200  azithromycin (ZITHROMAX) tablet 500 mg        500 mg Oral Daily at bedtime 09/10/21 0316     09/10/21 1000  azithromycin (ZITHROMAX) tablet 500 mg  Status:  Discontinued        500 mg Oral Daily 09/10/21 0312 09/10/21 0316   09/10/21 0330  cefTRIAXone (ROCEPHIN) 1 g in sodium chloride 0.9 % 100 mL IVPB         1 g 200 mL/hr over 30 Minutes Intravenous  Once 09/10/21 0321 09/10/21 0510   09/10/21 0315  cefTRIAXone (ROCEPHIN) 2 g in sodium chloride 0.9 % 100 mL IVPB  Status:  Discontinued        2 g 200 mL/hr over 30 Minutes Intravenous Every 24 hours 09/10/21 0312 09/10/21 0316   09/09/21 2230  cefTRIAXone (ROCEPHIN) 1 g in sodium chloride 0.9 % 100 mL IVPB        1 g 200 mL/hr over 30 Minutes Intravenous  Once 09/09/21 2221 09/10/21 0212   09/09/21 2230  azithromycin (ZITHROMAX) 500 mg in sodium chloride 0.9 % 250 mL IVPB        500 mg 250 mL/hr over 60 Minutes Intravenous  Once 09/09/21 2221 09/10/21 0359       Subjective: Seen and examined at bedside and  Continues to improve slowly.  Weaning down to 2 L of supplemental oxygen nasal cannula.  States that he is coughing up clear sputum.  Thinks he is improving and denies any likely pain.  Wife is concerned that he received an inhaler last night and states that he does not normally take an inhaler at night and states that it kept him up last night.  Denies any other concerns or complaints this time.   Objective: Vitals:   09/10/21 1821 09/10/21 2014 09/11/21 0748 09/11/21 0752  BP: (!) 118/92 129/82 124/86   Pulse: 81 91 65   Resp: '16 19 17   '$ Temp: 97.9 F (36.6 C) 97.6 F (36.4 C) 97.6 F (36.4 C)   TempSrc: Oral Oral Oral   SpO2: 93% 93% 92% 100%  Weight: 81 kg     Height: '5\' 11"'$  (1.803 m)       Intake/Output Summary (Last 24 hours) at 09/11/2021 0935 Last data filed at 09/11/2021 0830 Gross per 24 hour  Intake 340.11 ml  Output --  Net 340.11 ml   Filed Weights   09/10/21 1821  Weight: 81 kg   Examination: Physical Exam:  Constitutional: WN/WD Caucasian male currently no acute distress appears calm  Respiratory: Diminished to auscultation bilaterally with some slightly coarse breath sounds and mild rhonchi., no wheezing, rales, or crackles. Normal respiratory effort and patient is not tachypenic. No accessory muscle use.   Unlabored breathing but he is wearing 2 L of supplemental oxygen via nasal cannula Cardiovascular: RRR, no murmurs / rubs / gallops. S1 and S2 auscultated. No extremity edema.  Abdomen: Soft, non-tender, non-distended. Bowel sounds positive.  GU: Deferred. Musculoskeletal: No clubbing / cyanosis of digits/nails. No joint deformity upper and lower extremities  Skin: No rashes, lesions, ulcers on limited skin evaluation. No  induration; Warm and dry.  Neurologic: CN 2-12 grossly intact with no focal deficits.  Romberg sign and cerebellar reflexes not assessed.  Psychiatric: Normal judgment and insight. Alert and oriented x 3. Normal mood and appropriate affect.   Data Reviewed: I have personally reviewed following labs and imaging studies  CBC: Recent Labs  Lab 09/09/21 2104 09/10/21 0417 09/11/21 0541  WBC 17.1* 21.9* 11.2*  NEUTROABS 7.4 10.3* 4.8  HGB 14.0 14.2 13.8  HCT 42.8 43.0 40.3  MCV 94.9 95.1 92.0  PLT 90* 77* 90*   Basic Metabolic Panel: Recent Labs  Lab 09/09/21 2104 09/10/21 0417 09/11/21 0541  NA 133* 136 136  K 3.9 4.2 4.4  CL 100 102 104  CO2 '23 25 25  '$ GLUCOSE 105* 104* 148*  BUN '12 10 15  '$ CREATININE 1.06 1.03 0.94  CALCIUM 8.6* 8.9 9.2  MG  --  1.9 2.0  PHOS  --   --  4.4   GFR: Estimated Creatinine Clearance: 70.1 mL/min (by C-G formula based on SCr of 0.94 mg/dL). Liver Function Tests: Recent Labs  Lab 09/09/21 2104 09/10/21 0417 09/11/21 0541  AST '21 22 18  '$ ALT '19 18 17  '$ ALKPHOS 62 53 58  BILITOT 1.0 0.6 0.7  PROT 5.5* 5.9* 5.3*  ALBUMIN 3.3* 3.2* 2.8*   No results for input(s): "LIPASE", "AMYLASE" in the last 168 hours. No results for input(s): "AMMONIA" in the last 168 hours. Coagulation Profile: No results for input(s): "INR", "PROTIME" in the last 168 hours. Cardiac Enzymes: No results for input(s): "CKTOTAL", "CKMB", "CKMBINDEX", "TROPONINI" in the last 168 hours. BNP (last 3 results) No results for input(s): "PROBNP" in the last  8760 hours. HbA1C: No results for input(s): "HGBA1C" in the last 72 hours. CBG: No results for input(s): "GLUCAP" in the last 168 hours. Lipid Profile: No results for input(s): "CHOL", "HDL", "LDLCALC", "TRIG", "CHOLHDL", "LDLDIRECT" in the last 72 hours. Thyroid Function Tests: No results for input(s): "TSH", "T4TOTAL", "FREET4", "T3FREE", "THYROIDAB" in the last 72 hours. Anemia Panel: No results for input(s): "VITAMINB12", "FOLATE", "FERRITIN", "TIBC", "IRON", "RETICCTPCT" in the last 72 hours. Sepsis Labs: Recent Labs  Lab 09/09/21 2104 09/10/21 0417  PROCALCITON  --  <0.10  LATICACIDVEN 1.1 1.2    Recent Results (from the past 240 hour(s))  SARS Coronavirus 2 by RT PCR (hospital order, performed in [facility] hospital lab) *cepheid single result test*     Status: None   Collection Time: 09/09/21  8:08 PM   Specimen: Nasal Swab  Result Value Ref Range Status   SARS Coronavirus 2 by RT PCR NEGATIVE NEGATIVE Final    Comment: (NOTE) SARS-CoV-2 target nucleic acids are NOT DETECTED.  The SARS-CoV-2 RNA is generally detectable in upper and lower respiratory specimens during the acute phase of infection. The lowest concentration of SARS-CoV-2 viral copies this assay can detect is 250 copies / mL. A negative result does not preclude SARS-CoV-2 infection and should not be used as the sole basis for treatment or other patient management decisions.  A negative result may occur with improper specimen collection / handling, submission of specimen other than nasopharyngeal swab, presence of viral mutation(s) within the areas targeted by this assay, and inadequate number of viral copies (<250 copies / mL). A negative result must be combined with clinical observations, patient history, and epidemiological information.  Fact Sheet for Patients:   https://www.patel.info/  Fact Sheet for Healthcare Providers: https://hall.com/  This test is  not yet approved or  cleared by  the [employer] and has been authorized for detection and/or diagnosis of SARS-CoV-2 by FDA under an Emergency Use Authorization (EUA).  This EUA will remain in effect (meaning this test can be used) for the duration of the COVID-19 declaration under Section 564(b)(1) of the Act, 21 U.S.C. section 360bbb-3(b)(1), unless the authorization is terminated or revoked sooner.  Performed at [facility], [place] [address], [place], [place] [address]   Culture, blood (routine x 2)     Status: None (Preliminary result)   Collection Time: 09/09/21  9:04 PM   Specimen: BLOOD LEFT FOREARM  Result Value Ref Range Status   Specimen Description BLOOD LEFT FOREARM  Final   Special Requests   Final    BOTTLES DRAWN AEROBIC AND ANAEROBIC Blood Culture adequate volume   Culture   Final    NO GROWTH 2 DAYS Performed at [facility], [place] [address] [place], [place] [address]    Report Status PENDING  Incomplete  Culture, blood (routine x 2)     Status: None (Preliminary result)   Collection Time: 09/10/21  4:17 AM   Specimen: BLOOD LEFT FOREARM  Result Value Ref Range Status   Specimen Description BLOOD LEFT FOREARM  Final   Special Requests   Final    BOTTLES DRAWN AEROBIC AND ANAEROBIC Blood Culture adequate volume   Culture   Final    NO GROWTH 1 DAY Performed at [facility], [place] [address] [place], [place] [address]    Report Status PENDING  Incomplete  Respiratory (~20 pathogens) panel by PCR     Status: None   Collection Time: 09/10/21 10:48 AM   Specimen: Nasopharyngeal Swab; Respiratory  Result Value Ref Range Status   Adenovirus NOT DETECTED NOT DETECTED Final   Coronavirus 229E NOT DETECTED NOT DETECTED Final    Comment: (NOTE) The Coronavirus on the Respiratory Panel, DOES NOT test for the novel  Coronavirus (2019 nCoV)    Coronavirus HKU1 NOT DETECTED NOT DETECTED Final   Coronavirus NL63 NOT DETECTED NOT DETECTED Final    Coronavirus OC43 NOT DETECTED NOT DETECTED Final   Metapneumovirus NOT DETECTED NOT DETECTED Final   Rhinovirus / Enterovirus NOT DETECTED NOT DETECTED Final   Influenza A NOT DETECTED NOT DETECTED Final   Influenza B NOT DETECTED NOT DETECTED Final   Parainfluenza Virus 1 NOT DETECTED NOT DETECTED Final   Parainfluenza Virus 2 NOT DETECTED NOT DETECTED Final   Parainfluenza Virus 3 NOT DETECTED NOT DETECTED Final   Parainfluenza Virus 4 NOT DETECTED NOT DETECTED Final   Respiratory Syncytial Virus NOT DETECTED NOT DETECTED Final   Bordetella pertussis NOT DETECTED NOT DETECTED Final   Bordetella Parapertussis NOT DETECTED NOT DETECTED Final   Chlamydophila pneumoniae NOT DETECTED NOT DETECTED Final   Mycoplasma pneumoniae NOT DETECTED NOT DETECTED Final    Comment: Performed at [facility], [place]. [address] [place], [place] [address]     Radiology Studies: DG CHEST PORT 1 VIEW  Result Date: 09/11/2021 CLINICAL DATA:  [id number].  Shortness of breath. EXAM: PORTABLE CHEST 1 VIEW COMPARISON:  Portable chest and CTA chest both 09/09/21 FINDINGS: 4:49 a.m.  There is mild cardiomegaly without findings of CHF. 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 There are patchy peripheral hazy opacities of the mid to lower lung fields concerning for viral or atypical multilobar pneumonia.  The The remaining lungs clear with COPD change. The sulci are sharp. Mild thoracic dextroscoliosis and osteopenia. IMPRESSION: Increased patchy peripheral haziness in the mid to lower lung fields concerning for multilobar pneumonia including viral or atypical etiologies. Electronically Signed   By: Telford Nab M.D.   On: 09/11/2021 07:32   CT Angio Chest PE W and/or Wo Contrast  Result Date: 09/09/2021 CLINICAL DATA:  Positive D-dimer.  Concern for pulmonary embolism.  EXAM: CT ANGIOGRAPHY CHEST WITH CONTRAST TECHNIQUE: Multidetector CT imaging of the chest was performed using the standard protocol during bolus administration of intravenous contrast. Multiplanar CT image reconstructions and MIPs were obtained to evaluate the vascular anatomy. RADIATION DOSE REDUCTION: This exam was performed according to the departmental dose-optimization program which includes automated exposure control, adjustment of the mA and/or kV according to patient size and/or use of iterative reconstruction technique. CONTRAST:  22m OMNIPAQUE IOHEXOL 350 MG/ML SOLN COMPARISON:  Chest CT dated 08/19/2021. Chest radiograph dated 09/10/2018. FINDINGS: Evaluation of this exam is limited due to respiratory motion artifact. Cardiovascular: Mild cardiomegaly. No pericardial effusion. There is coronary vascular calcification. Moderate atherosclerotic calcification of the thoracic aorta. 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 Follow-up as per recommendation of the prior CT with cardiothoracic surgery consult. There is a left-sided aortic arch with aberrant right subclavian artery anatomy. No periaortic fluid collection. No extravasation of contrast. Evaluation of the pulmonary arteries is limited due to respiratory motion. No central acute pulmonary artery embolus identified. Mediastinum/Nodes: Top-normal left hilar lymph node measures 1 cm short axis. No mediastinal adenopathy. The esophagus is grossly unremarkable. No mediastinal fluid collection. Lungs/Pleura: Background of emphysema. Diffuse interstitial thickening and haziness primarily involving the left lung common new since the prior CT and may represent edema or infiltrate. No consolidative changes. There is no pleural effusion pneumothorax. The central airways are patent. Upper Abdomen: Multiple hepatic hypodense lesions, suboptimally characterized, likely cysts. Musculoskeletal: No acute osseous pathology.   Degenerative changes. Review of the MIP images confirms the above findings. IMPRESSION: 1. No CT evidence of central acute pulmonary artery embolus. 2. Left-sided aortic arch with aberrant right subclavian artery anatomy. No periaortic fluid collection. 3. 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 Follow-up as per recommendation of the prior CT with cardiothoracic surgery consult. 4. Aortic Atherosclerosis (ICD10-I70.0) and Emphysema (ICD10-J43.9). Electronically Signed   By: AAnner CreteM.D.   On: 09/09/2021 23:58   DG Chest Portable 1 View  Result Date: 09/09/2021 CLINICAL DATA:  Shortness of breath. EXAM: PORTABLE CHEST 1 VIEW COMPARISON:  Radiograph 08/24/2021.  CT 08/19/2021 FINDINGS: Patient is rotated. Stable heart size. 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 Comparison with prior exam is difficult due to degree of rotation. Calcified anterior mediastinal lymph nodes. Emphysema, ill-defined opacity in the left hemithorax, not definitively seen on prior. Resolved right infrahilar opacity from prior. Blunting of the costophrenic angles may be due to effusions or hyperinflation. No pneumothorax. Lower thoracic scoliosis. IMPRESSION: 1. Ill-defined opacity in the left hemithorax, may be infectious or asymmetric edema. 2. 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 Comparison with prior exam is difficult due to degree of rotation. 3. Resolved right infrahilar opacity. 4. Blunting of the costophrenic angles may be due to effusions or hyperinflation. Electronically Signed   By: MKeith RakeM.D.   On: 09/09/2021 20:44     Scheduled Meds:  azithromycin  500 mg Oral QHS   guaiFENesin  1,200 mg Oral BID   methylPREDNISolone (SOLU-MEDROL) injection  40 mg Intravenous Daily   mometasone-formoterol  2 puff Inhalation BID   And   umeclidinium bromide  1 puff Inhalation Daily    tamsulosin  0.4 mg Oral Daily   Continuous Infusions:  cefTRIAXone (ROCEPHIN)  IV Stopped (09/10/21 2352)     LOS: 1 day   Raiford Noble, DO [facility] Available via Epic secure chat 7am-7pm After these hours, please refer to coverage provider listed on [email or web address] 09/11/2021, 9:35 AM

## 2021-09-11 NOTE — Progress Notes (Signed)
0930- Patient SPO2 at 94% on 4L- titrated down to 2L, SPO2 92%. Tolerating well. Will continue to monitor

## 2021-09-11 NOTE — Evaluation (Signed)
Occupational Therapy Evaluation and Discharge Patient Details Name: Jason Abbott. MRN: [id number] DOB: February 07, 1945 Today's Date: 09/11/2021   History of Present Illness Pt is a 76 year old man admitted on 09/09/21 with cough, hypoxia, SOB, fever. Positive for B lower lobe PNA and COPD. Hospital course complicated by leg cramps, PMH: CLL, aortic arch aneurysm, stroke, COVID 19, HLD.   Clinical Impression   Pt is functioning modified independently. Educated in energy conservation strategies and pursed lip breathing as pt's Sp02 dropped to 86% with ambulation on RA, replaced 2L. No further OT needs.      Recommendations for follow up therapy are one component of a multi-disciplinary discharge planning process, led by the attending physician.  Recommendations may be updated based on patient status, additional functional criteria and insurance authorization.   Follow Up Recommendations  No OT follow up    Assistance Recommended at Discharge PRN  Patient can return home with the following      Functional Status Assessment  Patient has had a recent decline in their functional status and demonstrates the ability to make significant improvements in function in a reasonable and predictable amount of time.  Equipment Recommendations  None recommended by OT    Recommendations for Other Services       Precautions / Restrictions Precautions Precautions: Other (comment) Precaution Comments: watch 02      Mobility Bed Mobility Overal bed mobility: Modified Independent             General bed mobility comments: HOB up    Transfers Overall transfer level: Independent Equipment used: None                      Balance                                           ADL either performed or assessed with clinical judgement   ADL Overall ADL's : Modified independent                                       General ADL Comments: educated in  energy conservation strategies     Vision Ability to See in Adequate Light: 0 Adequate       Perception     Praxis      Pertinent Vitals/Pain Pain Assessment Pain Assessment: No/denies pain     Hand Dominance Right   Extremity/Trunk Assessment Upper Extremity Assessment Upper Extremity Assessment: Overall WFL for tasks assessed   Lower Extremity Assessment Lower Extremity Assessment: Defer to PT evaluation   Cervical / Trunk Assessment Cervical / Trunk Assessment: Normal   Communication Communication Communication: No difficulties   Cognition Arousal/Alertness: Awake/alert Behavior During Therapy: WFL for tasks assessed/performed Overall Cognitive Status: Within Functional Limits for tasks assessed                                       General Comments       Exercises     Shoulder Instructions      Home Living Family/patient expects to be discharged to:: Private residence Living Arrangements: Spouse/significant other Available Help at Discharge: Family;Available 24 hours/day Type of Home: House Home Access: Stairs to  enter Entrance Stairs-Number of Steps: flight   Home Layout: Two level   Alternate Level Stairs-Rails: Right Bathroom Shower/Tub: Teacher, early years/pre: Handicapped height     Home Equipment: None          Prior Functioning/Environment Prior Level of Function : Independent/Modified Independent;Driving                        OT Problem List:        OT Treatment/Interventions:      OT Goals(Current goals can be found in the care plan section)    OT Frequency:      Co-evaluation              AM-PAC OT "6 Clicks" Daily Activity     Outcome Measure Help from another person eating meals?: None Help from another person taking care of personal grooming?: None Help from another person toileting, which includes using toliet, bedpan, or urinal?: None Help from another person bathing  (including washing, rinsing, drying)?: None Help from another person to put on and taking off regular upper body clothing?: None Help from another person to put on and taking off regular lower body clothing?: None 6 Click Score: 24   End of Session Equipment Utilized During Treatment: Oxygen  Activity Tolerance: Patient tolerated treatment well Patient left: in bed;with call bell/phone within reach;with family/visitor present  OT Visit Diagnosis: Other (comment) (decreased activity tolerance)                Time: 6378-5885 OT Time Calculation (min): 27 min Charges:  OT General Charges $OT Visit: 1 Visit OT Evaluation $OT Eval Low Complexity: 1 Low  Cleta Alberts, OTR/L Acute Rehabilitation Services Office: [phone]   Malka So 09/11/2021, 10:28 AM

## 2021-09-11 NOTE — Progress Notes (Addendum)
NAME:  Jason Abbott., MRN:  [id number], DOB:  1945/05/29, LOS: 1 ADMISSION DATE:  09/09/2021, CONSULTATION DATE:  9/5 REFERRING MD:  Dr. Alfredia Ferguson, CHIEF COMPLAINT:  recurrent pneumonai   History of Present Illness:  Patient is 76 year old male with pertinent PMH of CLL, aortic arch aneurysm, BPH presents to [facility] ED on 9/4 with hypoxic respiratory failure.  Patient recently seen in early August 14, 2021 for right-sided pneumonia.  Was treated with Zithromax for 5 days.  On 8/19 patient failed to improve and went to urgent care.  Antibiotics broadened to Doxy for 10 days with 5-day steroid taper. Patient's symptoms much improved on abx. Since stopping abx patient started having worsening dyspnea.  On 9/4 patient was having progressively worsening sob.  Was checking sats at home on room air noted to be in 93s.  Denies fevers.  Came to [facility] ED for further eval.  Patient admitted to [facility] on 9/4.  Noted to be febrile 100.3 F.  On room air with sats 88%.  Was started on supplemental O2.  COVID/flu negative.  WBC 17.1.  BNP 73.  CXR showing opacity in left hemothorax.  CTA chest showing no PE; B/L lower lobe pneumonia (L greater than R).  Urine Legionella/strep pending. PCT <0.10. PCCM consulted for recurrent pneumonia.  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  O2 needs are decreasing.  Will encourage ambulation and pulmonary toilet  Objective   Blood pressure 124/86, pulse 65, temperature 97.6 F (36.4 C), temperature source Oral, resp. rate 17, height 5'  11" (1.803 m), weight 81 kg, SpO2 100 %.        Intake/Output Summary (Last 24 hours) at 09/11/2021 0934 Last data filed at 09/11/2021 0830 Gross per 24 hour  Intake 340.11 ml  Output --  Net 340.11 ml    Filed Weights   09/10/21 1821  Weight: 81 kg    Examination: General: Well-nourished well-developed male in no acute distress HEENT: MM pink/moist no JVD or lymphadenopathy is appreciated Neuro: Grossly intact without focal defect CV: Heart sounds are regular regular rate and rhythm PULM: Diminished in the bases, O2 has been decreased from 4 L to 2 L with sats of 94% GI: soft, bsx4 active  GU: Voids Extremities: warm/dry, 1+ lower edema  Skin: no rashes or lesions    Resolved Hospital Problem list     Assessment & Plan:  Acute respiratory failure with hypoxia Recurrent CAP COPD: Seen by Dr. Daphene Calamity outpatient P: Wean FiO2 for sats greater than 92% Continue bronchodilators Continue antimicrobial therapy for community-acquired pneumonia Respiratory viral panel at [facility]" negative Blood cultures are pending Sputum is pending Urine Legionella and strep pending Add flutter valve incentive spirometer Out of bed as tolerated    CLL Aortic arch aneurysm Hyponatremia Hypoalbuminemia BPH Thrombocytopenia Leg cramping P: -Per primary   Best Practice (right click and "Reselect all SmartList Selections" daily)   Per primary  Labs   CBC: Recent Labs  Lab 09/09/21 2104 09/10/21 0417 09/11/21 0541  WBC 17.1* 21.9* 11.2*  NEUTROABS 7.4 10.3* 4.8  HGB 14.0 14.2 13.8  HCT 42.8 43.0 40.3  MCV 94.9 95.1 92.0  PLT 90* 77* 90*     Basic Metabolic Panel: Recent Labs  Lab 09/09/21 2104 09/10/21 0417 09/11/21 0541  NA 133* 136 136  K 3.9 4.2 4.4  CL 100 102 104  CO2 '23 25 25  '$ GLUCOSE 105* 104* 148*  BUN '12 10 15  '$ CREATININE 1.06 1.03 0.94  CALCIUM 8.6* 8.9 9.2  MG  --  1.9 2.0  PHOS  --   --  4.4    GFR: Estimated Creatinine Clearance: 70.1 mL/min  (by C-G formula based on SCr of 0.94 mg/dL). Recent Labs  Lab 09/09/21 2104 09/10/21 0417 09/11/21 0541  PROCALCITON  --  <0.10  --   WBC 17.1* 21.9* 11.2*  LATICACIDVEN 1.1 1.2  --      Liver Function Tests: Recent Labs  Lab 09/09/21 2104 09/10/21 0417 09/11/21 0541  AST '21 22 18  '$ ALT '19 18 17  '$ ALKPHOS 62 53 58  BILITOT 1.0 0.6 0.7  PROT 5.5* 5.9* 5.3*  ALBUMIN 3.3* 3.2* 2.8*    No results for input(s): "LIPASE", "AMYLASE" in the last 168 hours. No results for input(s): "AMMONIA" in the last 168 hours.  ABG No results found for: "PHART", "PCO2ART", "PO2ART", "HCO3", "TCO2", "ACIDBASEDEF", "O2SAT"   Coagulation Profile: No results for input(s): "INR", "PROTIME" in the last 168 hours.  Cardiac Enzymes: No results for input(s): "CKTOTAL", "CKMB", "CKMBINDEX", "TROPONINI" in the last 168 hours.  HbA1C: No results found for: "HGBA1C"  CBG: No results for input(s): "GLUCAP" in the last 168 hours.        Richardson Landry Minor ACNP Acute Care Nurse Practitioner [facility] Please consult [place] 09/11/2021, 9:35 AM   PCCM attending:  This is a 76 year old gentleman past medical history of CLL, aortic arch aneurysm with planned surgery with vascular surgery in the future. Had a right-sided pneumonia in August.  Was treated with antibiotics and a steroid taper.  Still not much improved.  He came back to the ER with shortness of breath low-grade fevers.  Had a negative procalcitonin.  He had CT imaging the chest with infiltrates that were persistent.  Patient started on antibiotics again as well as steroids.  Overall patient is improving.  He states that he feels better today.  BP 124/86 (BP Location: Right Arm)   Pulse 65   Temp 97.6 F (36.4 C) (Oral)   Resp 17   Ht '5\' 11"'$  (1.803 m)   Wt 81 kg   SpO2 92%   BMI 24.91 kg/m  General: Elderly gentleman resting comfortably in bed wife at bedside. HEENT: NCAT, tracking appropriately Heart: Regular  rhythm S1-S2 Lungs: Crackles in the bilateral bases, no wheeze Abdomen: Soft nontender nondistended Extremities no significant edema  Labs: Reviewed Chest x-ray: Reviewed, infiltrates are still present.  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  Due to this is immune system is likely suppressed. This can take him a while to recover from the pneumonia that he had. Seems to be doing better on prednisone and antibiotics. would complete course. If he gets worse or his radiograph does not improve could consider bronchoscopy. But clinically it seems that he is improving. Would recommend him to have immunoglobulins, IVIG  checked as an outpatient after he is improved. Also will need a repeat CT scan of the chest in 6 weeks to ensure clearance of infiltrates.  Pulmonary will sign off at this time. Do not hesitate to reach out with any questions.  Garner Nash, DO [facility] 09/11/2021 9:54 AM

## 2021-09-12 ENCOUNTER — Inpatient Hospital Stay (HOSPITAL_BASED_OUTPATIENT_CLINIC_OR_DEPARTMENT_OTHER): Payer: Medicare Other

## 2021-09-12 ENCOUNTER — Encounter (HOSPITAL_COMMUNITY): Payer: Self-pay

## 2021-09-12 ENCOUNTER — Inpatient Hospital Stay (HOSPITAL_COMMUNITY): Payer: Medicare Other

## 2021-09-12 ENCOUNTER — Other Ambulatory Visit (HOSPITAL_COMMUNITY): Payer: Medicare Other

## 2021-09-12 ENCOUNTER — Ambulatory Visit (HOSPITAL_COMMUNITY): Payer: Medicare Other

## 2021-09-12 DIAGNOSIS — I7122 Aneurysm of the aortic arch, without rupture: Secondary | ICD-10-CM | POA: Diagnosis not present

## 2021-09-12 DIAGNOSIS — Z01810 Encounter for preprocedural cardiovascular examination: Secondary | ICD-10-CM

## 2021-09-12 DIAGNOSIS — J189 Pneumonia, unspecified organism: Secondary | ICD-10-CM | POA: Diagnosis not present

## 2021-09-12 DIAGNOSIS — J9601 Acute respiratory failure with hypoxia: Secondary | ICD-10-CM | POA: Diagnosis not present

## 2021-09-12 DIAGNOSIS — C9110 Chronic lymphocytic leukemia of B-cell type not having achieved remission: Secondary | ICD-10-CM | POA: Diagnosis not present

## 2021-09-12 LAB — COMPREHENSIVE METABOLIC PANEL
ALT: 18 U/L (ref 0–44)
AST: 17 U/L (ref 15–41)
Albumin: 3.1 g/dL — ABNORMAL LOW (ref 3.5–5.0)
Alkaline Phosphatase: 55 U/L (ref 38–126)
Anion gap: 11 (ref 5–15)
BUN: 19 mg/dL (ref 8–23)
CO2: 26 mmol/L (ref 22–32)
Calcium: 8.8 mg/dL — ABNORMAL LOW (ref 8.9–10.3)
Chloride: 98 mmol/L (ref 98–111)
Creatinine, Ser: 0.89 mg/dL (ref 0.61–1.24)
GFR, Estimated: >60 mL/min (ref >60)
Glucose, Bld: 109 mg/dL — ABNORMAL HIGH (ref 70–99)
Potassium: 4.4 mmol/L (ref 3.5–5.1)
Sodium: 135 mmol/L (ref 135–145)
Total Bilirubin: 0.5 mg/dL (ref 0.3–1.2)
Total Protein: 5.4 g/dL — ABNORMAL LOW (ref 6.5–8.1)

## 2021-09-12 LAB — CBC WITH DIFFERENTIAL/PLATELET
Abs Immature Granulocytes: 0 10*3/uL (ref 0.00–0.07)
Basophils Absolute: 0 10*3/uL (ref 0.0–0.1)
Basophils Relative: 0 %
Eosinophils Absolute: 0 10*3/uL (ref 0.0–0.5)
Eosinophils Relative: 0 %
HCT: 39.7 % (ref 39.0–52.0)
Hemoglobin: 13.5 g/dL (ref 13.0–17.0)
Lymphocytes Relative: 53 %
Lymphs Abs: 11.8 10*3/uL — ABNORMAL HIGH (ref 0.7–4.0)
MCH: 31.5 pg (ref 26.0–34.0)
MCHC: 34 g/dL (ref 30.0–36.0)
MCV: 92.8 fL (ref 80.0–100.0)
Monocytes Absolute: 1.6 10*3/uL — ABNORMAL HIGH (ref 0.1–1.0)
Monocytes Relative: 7 %
Neutro Abs: 8.9 10*3/uL — ABNORMAL HIGH (ref 1.7–7.7)
Neutrophils Relative %: 40 %
Platelets: 107 10*3/uL — ABNORMAL LOW (ref 150–400)
RBC: 4.28 MIL/uL (ref 4.22–5.81)
RDW: 13.4 % (ref 11.5–15.5)
WBC: 22.3 10*3/uL — ABNORMAL HIGH (ref 4.0–10.5)
nRBC: 0 % (ref 0.0–0.2)

## 2021-09-12 LAB — LEGIONELLA PNEUMOPHILA SEROGP 1 UR AG: L. pneumophila Serogp 1 Ur Ag: NEGATIVE

## 2021-09-12 LAB — MAGNESIUM: Magnesium: 2.2 mg/dL (ref 1.7–2.4)

## 2021-09-12 LAB — PHOSPHORUS: Phosphorus: 4.1 mg/dL (ref 2.5–4.6)

## 2021-09-12 NOTE — Progress Notes (Signed)
Mobility Specialist Criteria Algorithm Info.   09/12/21 0935  Oxygen Therapy  SpO2 95 %  O2 Device Nasal Cannula  Patient Activity (if Appropriate) Ambulating  Pulse Oximetry Type Continuous  Mobility  Activity Ambulated independently in hallway;Dangled on edge of bed  Range of Motion/Exercises Active;All extremities  Level of Assistance Independent  Assistive Device None  Distance Ambulated (ft) 380 ft  Activity Response Tolerated well   Pre Mobility: SpO2 97% 3LO2 During Mobility: SpO2 95% 2LO2 Post Mobility: SpO2 98% 3LO2  Patient received dangling EOB agreeable to participate in mobility. Prior to ambulation completed education with receptive feedback on energy conservation, importance of IS, pursed lip breathing. Ambulated independently with steady gait. Tolerated ambulation on 2LO2, maintaining an SpO2 >95% throughout. Returned to room without complaint or incident. Was left in dangling EOB with all needs met, call bell in reach.   [place] Brailey Buescher, [place], [place]  SWFUX:[phone] Office: [phone]

## 2021-09-12 NOTE — Progress Notes (Signed)
PROGRESS NOTE    Jason Abbott.  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  He was noted to have room air saturations of 80s on his home pulse oximetry and was initially seen at the beginning part of August for right-sided pneumonia and was treated with 5 days of p.o. azithromycin and he felt improved.  Subsequently he went to urgent care on 08/24/2021 and then was prescribed Levaquin however given his history of aortic aneurysm this was changed to doxycycline and he completed 10 days of the doxycycline and prednisone 5 days ago but since then he has had worsening shortness of breath and cough.  Has not had any recurrent fevers.  He has noted that with mild exertion that his O2 saturations dropped in the 80s and he has had a home pulse oximeter that he acquired when he had COVID 2 years ago.     In the ED he had a temperature of 100.3 and a heart rate of 79 a blood pressure of 132/81 and saturating 88% on room air.  He is started on supplemental oxygen and COVID testing was negative.  White blood cell count elevated 17.1 and BNP of 73.  CT scan of the chest was done to evaluate for PE and showed "No CT evidence of central acute pulmonary artery embolus. Left-sided aortic arch with aberrant right subclavian artery anatomy. No periaortic fluid collection. 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 Follow-up as per  recommendation of the prior CT with cardiothoracic surgery  consult. Aortic Atherosclerosis and Emphysema."   CT scan did mention "Diffuse interstitial thickening and haziness primarily involving the left lung common new since the prior CT and may represent edema or infiltrate. No consolidative changes. There is no pleural effusion pneumothorax.The central airways are patent."   Pulmonary Evaluated and felt that his right lower lobe streaky infiltrate was more likely atelectasis.  His respiratory virus panel was negative.  Pulmonary recommends continuing IV ceftriaxone azithromycin and steroids empirically starting him on steroids setting of Solu-Medrol 40 mg IV for 10 days and then transitioning to oral prednisone milligrams daily tapering by 10 mg every 5 days to wean off.  He is slowly improving and attempts to wean him off oxygen were unsuccessful today as he desaturated.  He was placed back on supplemental oxygen.  Currently cardiothoracic surgery has ordered a vascular ultrasound for pre-CABG evaluation given that he is going to go for an aortic aneurysm repair next month.   Assessment and Plan: * Acute respiratory failure with hypoxia ([place]) -Admitted to med/surg bed as Observation.  -SpO2: 95 % O2 Flow Rate (L/min): 2 L/min  -Continuous pulse oximetry maintain O2 saturation greater than 90% -Continue Dulera plus Incruse Ellipta nebs -Continue supplemental oxygen via nasal cannula wean oxygen to room air -Repeat chest x-ray in the a.m. and an ambulatory home O2 screen prior to discharge as the patient does not wear supplemental oxygen at home -See Below    Community Acquired Bilateral lower lobe pneumonia with underlying  COPD -Pt initially on Zithromax for 5 days in beginning of August 2023. He took that for 5 days. Did not improve.  -Seen in Urgent Care  and placed on Levaquin but due to his hx of aortic arch aneurysm, this was changed to doxycycline. Pt completed 10 days course of this 5 days ago. Since stopping doxycycline, he has felt worse.   -Now hypoxic and has developed LLL pneumonia as well.   -Given his immunosuppressed status with CLL, will continue IV Rocephin/Azithromycin.  -May need a full 14 days of po abx given his hx of CLL. -See above -We will add guaifenesin 1200 mg p.o. twice daily, flutter valve and incentive spirometry -We will obtain Strep Urine Ag and Legionella Pneumophilia Ur Ag -WBC went from 17.1 -> 21.9 -> 11.2 -BNP was 73.1 -Lactic acid level was not elevated and was 1.1 and repeat was 1.2; procalcitonin level was less than 0.10 -We will try and have a sputum culture sent off -SARS-CoV-2 testing was negative -Blood cultures x2 show NGTD at 1 Day -Will involve Pulmonary given prolonged illness and they are recommending continuing empiric antibiotics with IV azithromycin and ceftriaxone.  Pulmonary is also recommended following up on the urine strep Legionella antigen and also recommending starting empiric steroids with IV Solu-Medrol 40 mg daily for 2 days and then transition to prednisone 40 mg/day and then tapering by 10 mg every 5 days till weaning off; patient received his second day of Solu-Medrol and will be transition to oral prednisone tomorrow morning -OOB as tolerated -Repeat CXR on 09/11/21 showed "Increased patchy peripheral haziness in the mid to lower lung fields concerning for multilobar pneumonia including viral or atypical etiologies." -Repeat CXR today showed "Peripheral hazy opacities are unchanged with underlying interstitial thickening. Overall aeration is stable. No new abnormality." -See below but pulmonary has signed off the case and feels that if he gets worse or his radiograph does not improve they could consider a bronchoscopy but for now he is clinically improving -Pulmonary recommends repeating CT scan of the chest in 6 weeks to ensure clearance of his infiltrates and recommends continuing antibiotics and steroids -Ambulatory home O2 screen done today and patient desaturated; we will  repeat again in the morning to see if patient will qualify for oxygen prior to discharging home   CLL (chronic lymphocytic leukemia) ([place]) -Chronic.  -Follows with Hematology/Oncology and will notify Dr. Marin Olp of patient's Hospitalization -Recent beta-2 microglobulin was 2.7 with an IgG level of 808, IgM level of 39, and an IgA level 149 back on 08/26/2021 -Patient's WBC has gone from 17.1 -> 21.9 -> 11.2 -> 22.3 (In the setting of Steroid Demargination)  -Continue monitor and trend and repeat CBC in a.m -Medical oncology evaluated and they do not feel that his CLL is a major factor with respect to him having pneumonia and they think that his immune system is relatively competent and he felt that he does not have hypogammaglobulinemia and they do not feel that he needs IVIG. 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  -Pt and wife states he has upcoming appointment with vascular surgery to schedule aortic arch repair. -Per CT Scan "Left-sided aortic arch with aberrant right subclavian artery anatomy. No periaortic fluid collection. 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 Follow-up as per recommendation of the prior CT with cardiothoracic surgery consult." -Cardiothoracic surgery is ordering  an echocardiogram for this patient -Scheduled for Aortic Arch Surgery in October -Currently cardiothoracic surgery has been getting his preoperative test done and Dr. Darcey Nora has recommended obtaining a echocardiogram as well as a vascular ultrasound Doppler pre-CABG -Echocardiogram done and showed:   "1. Left ventricular ejection fraction, by estimation, is 55 to 60%. The  left ventricle has normal function. The left ventricle has no regional  wall motion abnormalities. Left ventricular diastolic parameters are  indeterminate.   2. Right ventricular systolic function  is normal. The right ventricular  size is mildly enlarged. There is normal pulmonary artery systolic  pressure.   3. Right atrial size was severely dilated.   4. The mitral valve is normal in structure. No evidence of mitral valve  regurgitation. No evidence of mitral stenosis.   5. The aortic valve is tricuspid. Aortic valve regurgitation is not  visualized. Aortic valve sclerosis is present, with no evidence of aortic  valve stenosis.   6. There is moderate dilatation of the aortic root, measuring 43 mm.   7. The inferior vena cava is normal in size with greater than 50%  respiratory variability, suggesting right atrial pressure of 3 mmHg."  -Cardiothoracic Surgery is also obtaining Vascular U/S Doppler Pre-CABG and this is done and pending read   Hyponatremia -Patient's sodium went from 133 is now 136 x2 -> 135 -Continue monitor and trend and repeat CMP in a.m.   Hypoalbuminemia -Patient's albumin level went from 3.3 -> 3.2 -> 2.8 -> 3.1 Frequency monitor and trend and repeat CMP in a.m.   BPH -Continue with Tamsulosin 0.4 mg p.o. daily   Thrombocytopenia (HCC) -New onset.   -Patient's platelet count has been dropping since 8/21 and went from 146 -> 90 -> 77 -> 90 and is now improved to 107 -May be due to his acute illness with pneumonia.   -SCDs for prophylaxis.  No heparin or Lovenox currently given his continued dropping platelet count -Continue to monitor for signs and symptoms bleeding; no overt bleeding noted-repeat CBC in a.m.   Leg cramping, improved -Check magnesium level and give a dose of IV mag sulfate 1 g times once -Patient tried mustard packets x2 and did not help -We will order ibuprofen 400 mg x 1 -Recommending stretching and massaging the area -Order Muscle Rub Cream and K Pad for the cramping   DVT prophylaxis: SCDs Start: 09/10/21 0313    Code Status: Full Code Family Communication: Discussed with wife at bedside  Disposition Plan:  Level of care:  Med-Surg Status is: Inpatient Remains inpatient appropriate because: Improving slowly and will anticipate D/C home tomorrow after repeat Home O2 Screen   Consultants:  Pulmonary Medical Oncology   Procedures:  ECHOCARDIOGRAM IMPRESSIONS     1. Left ventricular ejection fraction, by estimation, is 55 to 60%. The  left ventricle has normal function. The left ventricle has no regional  wall motion abnormalities. Left ventricular diastolic parameters are  indeterminate.   2. Right ventricular systolic function is normal. The right ventricular  size is mildly enlarged. There is normal pulmonary artery systolic  pressure.   3. Right atrial size was severely dilated.   4. The mitral valve is normal in structure. No evidence of mitral valve  regurgitation. No evidence of mitral stenosis.   5. The aortic valve is tricuspid. Aortic valve regurgitation is not  visualized. Aortic valve sclerosis is present, with no evidence of aortic  valve stenosis.   6. There is moderate dilatation of the aortic  root, measuring 43 mm.   7. The inferior vena cava is normal in size with greater than 50%  respiratory variability, suggesting right atrial pressure of 3 mmHg.   FINDINGS   Left Ventricle: Left ventricular ejection fraction, by estimation, is 55  to 60%. The left ventricle has normal function. The left ventricle has no  regional wall motion abnormalities. The left ventricular internal cavity  size was normal in size. There is   no left ventricular hypertrophy. Left ventricular diastolic parameters  are indeterminate.   Right Ventricle: The right ventricular size is mildly enlarged. No  increase in right ventricular wall thickness. Right ventricular systolic  function is normal. There is normal pulmonary artery systolic pressure.  The tricuspid regurgitant velocity is 2.28   m/s, and with an assumed right atrial pressure of 3 mmHg, the estimated  right ventricular systolic pressure is 40.9  mmHg.   Left Atrium: Left atrial size was normal in size.   Right Atrium: Right atrial size was severely dilated.   Pericardium: There is no evidence of pericardial effusion. Presence of  epicardial fat layer.   Mitral Valve: The mitral valve is normal in structure. No evidence of  mitral valve regurgitation. No evidence of mitral valve stenosis.   Tricuspid Valve: The tricuspid valve is normal in structure. Tricuspid  valve regurgitation is not demonstrated. No evidence of tricuspid  stenosis.   Aortic Valve: The aortic valve is tricuspid. Aortic valve regurgitation is  not visualized. Aortic valve sclerosis is present, with no evidence of  aortic valve stenosis. Aortic valve mean gradient measures 3.5 mmHg.  Aortic valve peak gradient measures 5.6   mmHg. Aortic valve area, by VTI measures 2.92 cm.   Pulmonic Valve: The pulmonic valve was normal in structure. Pulmonic valve  regurgitation is not visualized. No evidence of pulmonic stenosis.   Aorta: There is moderate dilatation of the aortic root, measuring 43 mm.   Venous: The inferior vena cava is normal in size with greater than 50%  respiratory variability, suggesting right atrial pressure of 3 mmHg.   IAS/Shunts: No atrial level shunt detected by color flow Doppler.      LEFT VENTRICLE  PLAX 2D  LVIDd:         5.20 cm      Diastology  LVIDs:         3.60 cm      LV e' medial:    7.18 cm/s  LV PW:         1.00 cm      LV E/e' medial:  6.6  LV IVS:        0.90 cm      LV e' lateral:   10.20 cm/s  LVOT diam:     2.30 cm      LV E/e' lateral: 4.6  LV SV:         61  LV SV Index:   30  LVOT Area:     4.15 cm     LV Volumes (MOD)  LV vol d, MOD A2C: 85.4 ml  LV vol d, MOD A4C: 106.0 ml  LV vol s, MOD A2C: 36.8 ml  LV vol s, MOD A4C: 46.9 ml  LV SV MOD A2C:     48.6 ml  LV SV MOD A4C:     106.0 ml  LV SV MOD BP:      60.6 ml   RIGHT VENTRICLE  RV Basal diam:  4.40 cm  RV Mid diam:  4.00 cm  RV S prime:      28.80 cm/s  TAPSE (M-mode): 3.0 cm   LEFT ATRIUM             Index        RIGHT ATRIUM           Index  LA diam:        3.70 cm 1.84 cm/m   RA Area:     31.80 cm  LA Vol (A2C):   48.2 ml 23.99 ml/m  RA Volume:   118.00 ml 58.72 ml/m  LA Vol (A4C):   53.6 ml 26.67 ml/m  LA Biplane Vol: 52.6 ml 26.18 ml/m   AORTIC VALVE                    PULMONIC VALVE  AV Area (Vmax):    2.67 cm     PV Vmax:          1.14 m/s  AV Area (Vmean):   2.58 cm     PV Peak grad:     5.2 mmHg  AV Area (VTI):     2.92 cm     PR End Diast Vel: 2.84 msec  AV Vmax:           118.50 cm/s  AV Vmean:          83.800 cm/s  AV VTI:            0.208 m  AV Peak Grad:      5.6 mmHg  AV Mean Grad:      3.5 mmHg  LVOT Vmax:         76.20 cm/s  LVOT Vmean:        52.100 cm/s  LVOT VTI:          0.146 m  LVOT/AV VTI ratio: 0.70     AORTA  Ao Root diam: 4.30 cm  Ao Asc diam:  3.30 cm   MITRAL VALVE               TRICUSPID VALVE  MV Area (PHT): 6.07 cm    TR Peak grad:   20.8 mmHg  MV Decel Time: 125 msec    TR Vmax:        228.00 cm/s  MR Peak grad: 11.6 mmHg  MR Vmax:      170.00 cm/s  SHUNTS  MV E velocity: 47.10 cm/s  Systemic VTI:  0.15 m  MV A velocity: 70.80 cm/s  Systemic Diam: 2.30 cm  MV E/A ratio:  0.67   VASCULAR U/S DOPPLER PRE CABG  Antimicrobials:  Anti-infectives (From admission, onward)    Start     Dose/Rate Route Frequency Ordered Stop   09/11/21 0000  cefTRIAXone (ROCEPHIN) 2 g in sodium chloride 0.9 % 100 mL IVPB        2 g 200 mL/hr over 30 Minutes Intravenous Every 24 hours 09/10/21 0316 09/14/21 2359   09/10/21 2200  azithromycin (ZITHROMAX) tablet 500 mg        500 mg Oral Daily at bedtime 09/10/21 0316     09/10/21 1000  azithromycin (ZITHROMAX) tablet 500 mg  Status:  Discontinued        500 mg Oral Daily 09/10/21 0312 09/10/21 0316   09/10/21 0330  cefTRIAXone (ROCEPHIN) 1 g in sodium chloride 0.9 % 100 mL IVPB        1 g 200 mL/hr over 30 Minutes Intravenous  Once  09/10/21 0321 09/10/21  0510   09/10/21 0315  cefTRIAXone (ROCEPHIN) 2 g in sodium chloride 0.9 % 100 mL IVPB  Status:  Discontinued        2 g 200 mL/hr over 30 Minutes Intravenous Every 24 hours 09/10/21 0312 09/10/21 0316   09/09/21 2230  cefTRIAXone (ROCEPHIN) 1 g in sodium chloride 0.9 % 100 mL IVPB        1 g 200 mL/hr over 30 Minutes Intravenous  Once 09/09/21 2221 09/10/21 0212   09/09/21 2230  azithromycin (ZITHROMAX) 500 mg in sodium chloride 0.9 % 250 mL IVPB        500 mg 250 mL/hr over 60 Minutes Intravenous  Once 09/09/21 2221 09/10/21 0359       Subjective: Seen and Examined bedside and thinks he is doing little bit better.  States that he is not short of breath but continues to wear oxygen.  States that he did not sleep very well again last night but states it is little bit better than the night before.  No nausea or vomiting.  No other concerns or complaints this am.  Objective: Vitals:   09/11/21 1948 09/12/21 0439 09/12/21 0803 09/12/21 0935  BP: 130/80  106/67   Pulse: 65  72   Resp: 17  15   Temp: 97.7 F (36.5 C)  97.9 F (36.6 C)   TempSrc: Oral  Oral   SpO2: 96%  97% 95%  Weight:  81.6 kg    Height:        Intake/Output Summary (Last 24 hours) at 09/12/2021 1351 Last data filed at 09/12/2021 0507 Gross per 24 hour  Intake 300 ml  Output --  Net 300 ml   Filed Weights   09/10/21 1821 09/12/21 0439  Weight: 81 kg 81.6 kg   Examination: Physical Exam:  Constitutional: WN/WD Caucasian male currently no acute distress appears calm.  Respiratory: Diminished to auscultation bilaterally with coarse breath sounds and has some slight rhonchi at the bases, no wheezing, rales, rhonchi or crackles. Normal respiratory effort and patient is not tachypenic. No accessory muscle use.  Unlabored breathing but is wearing 2 liters of supplemental oxygen nasal cannula Cardiovascular: RRR, no murmurs / rubs / gallops. S1 and S2 auscultated. No extremity edema. Abdomen:  Soft, non-tender, non-distended. Bowel sounds positive.  GU: Deferred. Musculoskeletal: No clubbing / cyanosis of digits/nails. No joint deformity upper and lower extremities.   Skin: No rashes, lesions, ulcers, limited skin evaluation. No induration; Warm and dry.  Neurologic: CN 2-12 grossly intact with no focal deficits. Romberg sign and cerebellar reflexes not assessed.  Psychiatric: Normal judgment and insight. Alert and oriented x 3. Normal mood and appropriate affect.   Data Reviewed: I have personally reviewed following labs and imaging studies  CBC: Recent Labs  Lab 09/09/21 2104 09/10/21 0417 09/11/21 0541 09/12/21 0456  WBC 17.1* 21.9* 11.2* 22.3*  NEUTROABS 7.4 10.3* 4.8 8.9*  HGB 14.0 14.2 13.8 13.5  HCT 42.8 43.0 40.3 39.7  MCV 94.9 95.1 92.0 92.8  PLT 90* 77* 90* 326*   Basic Metabolic Panel: Recent Labs  Lab 09/09/21 2104 09/10/21 0417 09/11/21 0541 09/12/21 0456  NA 133* 136 136 135  K 3.9 4.2 4.4 4.4  CL 100 102 104 98  CO2 '23 25 25 26  '$ GLUCOSE 105* 104* 148* 109*  BUN '12 10 15 19  '$ CREATININE 1.06 1.03 0.94 0.89  CALCIUM 8.6* 8.9 9.2 8.8*  MG  --  1.9 2.0 2.2  PHOS  --   --  4.4 4.1   GFR: Estimated Creatinine Clearance: 74 mL/min (by C-G formula based on SCr of 0.89 mg/dL). Liver Function Tests: Recent Labs  Lab 09/09/21 2104 09/10/21 0417 09/11/21 0541 09/12/21 0456  AST '21 22 18 17  '$ ALT '19 18 17 18  '$ ALKPHOS 62 53 58 55  BILITOT 1.0 0.6 0.7 0.5  PROT 5.5* 5.9* 5.3* 5.4*  ALBUMIN 3.3* 3.2* 2.8* 3.1*   No results for input(s): "LIPASE", "AMYLASE" in the last 168 hours. No results for input(s): "AMMONIA" in the last 168 hours. Coagulation Profile: No results for input(s): "INR", "PROTIME" in the last 168 hours. Cardiac Enzymes: No results for input(s): "CKTOTAL", "CKMB", "CKMBINDEX", "TROPONINI" in the last 168 hours. BNP (last 3 results) No results for input(s): "PROBNP" in the last 8760 hours. HbA1C: No results for input(s):  "HGBA1C" in the last 72 hours. CBG: No results for input(s): "GLUCAP" in the last 168 hours. Lipid Profile: No results for input(s): "CHOL", "HDL", "LDLCALC", "TRIG", "CHOLHDL", "LDLDIRECT" in the last 72 hours. Thyroid Function Tests: No results for input(s): "TSH", "T4TOTAL", "FREET4", "T3FREE", "THYROIDAB" in the last 72 hours. Anemia Panel: No results for input(s): "VITAMINB12", "FOLATE", "FERRITIN", "TIBC", "IRON", "RETICCTPCT" in the last 72 hours. Sepsis Labs: Recent Labs  Lab 09/09/21 2104 09/10/21 0417  PROCALCITON  --  <0.10  LATICACIDVEN 1.1 1.2    Recent Results (from the past 240 hour(s))  SARS Coronavirus 2 by RT PCR (hospital order, performed in [facility] hospital lab) *cepheid single result test*     Status: None   Collection Time: 09/09/21  8:08 PM   Specimen: Nasal Swab  Result Value Ref Range Status   SARS Coronavirus 2 by RT PCR NEGATIVE NEGATIVE Final    Comment: (NOTE) SARS-CoV-2 target nucleic acids are NOT DETECTED.  The SARS-CoV-2 RNA is generally detectable in upper and lower respiratory specimens during the acute phase of infection. The lowest concentration of SARS-CoV-2 viral copies this assay can detect is 250 copies / mL. A negative result does not preclude SARS-CoV-2 infection and should not be used as the sole basis for treatment or other patient management decisions.  A negative result may occur with improper specimen collection / handling, submission of specimen other than nasopharyngeal swab, presence of viral mutation(s) within the areas targeted by this assay, and inadequate number of viral copies (<250 copies / mL). A negative result must be combined with clinical observations, patient history, and epidemiological information.  Fact Sheet for Patients:   https://www.patel.info/  Fact Sheet for Healthcare Providers: https://hall.com/  This test is not yet approved or  cleared by the [place] FDA and has been authorized for detection and/or diagnosis of SARS-CoV-2 by FDA under an Emergency Use Authorization (EUA).  This EUA will remain in effect (meaning this test can be used) for the duration of the COVID-19 declaration under Section 564(b)(1) of the Act, 21 U.S.C. section 360bbb-3(b)(1), unless the authorization is terminated or revoked sooner.  Performed at [facility], [place] [address], [place], [place] [address]   Culture, blood (routine x 2)     Status: None (Preliminary result)   Collection Time: 09/09/21  9:04 PM   Specimen: BLOOD LEFT FOREARM  Result Value Ref Range Status   Specimen Description BLOOD LEFT FOREARM  Final   Special Requests   Final    BOTTLES DRAWN AEROBIC AND ANAEROBIC Blood Culture adequate volume   Culture   Final    NO GROWTH 3 DAYS Performed at [facility], [place] [address], [place], [place] [address]    Report Status PENDING  Incomplete  Culture, blood (routine x 2)     Status: None (Preliminary result)   Collection Time: 09/10/21  4:17 AM   Specimen: BLOOD LEFT FOREARM  Result Value Ref Range Status   Specimen Description BLOOD LEFT FOREARM  Final   Special Requests   Final    BOTTLES DRAWN AEROBIC AND ANAEROBIC Blood Culture adequate volume   Culture   Final    NO GROWTH 2 DAYS Performed at [facility], [place] [address] [place], [place] [address]    Report Status PENDING  Incomplete  Respiratory (~20 pathogens) panel by PCR     Status: None   Collection Time: 09/10/21 10:48 AM   Specimen: Nasopharyngeal Swab; Respiratory  Result Value Ref Range Status   Adenovirus NOT DETECTED NOT DETECTED Final   Coronavirus 229E NOT DETECTED NOT DETECTED Final    Comment: (NOTE) The Coronavirus on the Respiratory Panel, DOES NOT test for the novel  Coronavirus (2019 nCoV)    Coronavirus HKU1 NOT DETECTED NOT DETECTED Final   Coronavirus NL63 NOT DETECTED NOT DETECTED Final   Coronavirus OC43 NOT DETECTED NOT DETECTED  Final   Metapneumovirus NOT DETECTED NOT DETECTED Final   Rhinovirus / Enterovirus NOT DETECTED NOT DETECTED Final   Influenza A NOT DETECTED NOT DETECTED Final   Influenza B NOT DETECTED NOT DETECTED Final   Parainfluenza Virus 1 NOT DETECTED NOT DETECTED Final   Parainfluenza Virus 2 NOT DETECTED NOT DETECTED Final   Parainfluenza Virus 3 NOT DETECTED NOT DETECTED Final   Parainfluenza Virus 4 NOT DETECTED NOT DETECTED Final   Respiratory Syncytial Virus NOT DETECTED NOT DETECTED Final   Bordetella pertussis NOT DETECTED NOT DETECTED Final   Bordetella Parapertussis NOT DETECTED NOT DETECTED Final   Chlamydophila pneumoniae NOT DETECTED NOT DETECTED Final   Mycoplasma pneumoniae NOT DETECTED NOT DETECTED Final    Comment: Performed at [facility], [place]. [address] [place], [place] [address]     Radiology Studies: VAS US DOPPLER PRE CABG  Result Date: 09/12/2021 PREOPERATIVE VASCULAR EVALUATION Patient Name:  Jason Abbott.  Date of Exam:   09/12/2021 Medical Rec #: [id number]            Accession #:    [id number] Date of Birth: [birth year]            Patient Gender: M Patient Age:   14 years Exam Location:  [facility] Procedure:      VAS US DOPPLER PRE CABG Referring Phys: Collier Salina VANTRIGT --------------------------------------------------------------------------------  Indications:      Pre-op (aortic arch repair). Risk Factors:     Hyperlipidemia, past history of smoking, prior CVA. Limitations:      UE doppler waveforms difficult to obtain due to irregular                   rhythm Comparison Study: No previous exams Performing Technologist: Hill, Jody RVT, RDMS  Examination Guidelines: A complete evaluation includes B-mode imaging, spectral Doppler, color Doppler, and power Doppler as needed of all accessible portions of each vessel. Bilateral testing is considered an integral part of a complete examination. Limited examinations for reoccurring indications may be performed as  noted.  Right Carotid Findings: +----------+--------+--------+--------+--------+------------------+           PSV cm/sEDV cm/sStenosisDescribeComments           +----------+--------+--------+--------+--------+------------------+ CCA Prox  71  23                                         +----------+--------+--------+--------+--------+------------------+ CCA Distal81      32                      intimal thickening +----------+--------+--------+--------+--------+------------------+ ICA Prox  61      23                      intimal thickening +----------+--------+--------+--------+--------+------------------+ ICA Distal68      22                                         +----------+--------+--------+--------+--------+------------------+ ECA       45      1                                          +----------+--------+--------+--------+--------+------------------+ +----------+--------+-------+----------------+------------+           PSV cm/sEDV cmsDescribe        Arm Pressure +----------+--------+-------+----------------+------------+ Subclavian67             Multiphasic, WNL             +----------+--------+-------+----------------+------------+ +---------+--------+--+--------+--+---------+ VertebralPSV cm/s39EDV cm/s11Antegrade +---------+--------+--+--------+--+---------+ Left Carotid Findings: +----------+--------+--------+--------+--------+--------+           PSV cm/sEDV cm/sStenosisDescribeComments +----------+--------+--------+--------+--------+--------+ CCA Prox  85      23                               +----------+--------+--------+--------+--------+--------+ CCA Distal74      20                               +----------+--------+--------+--------+--------+--------+ ICA Prox  64      20                               +----------+--------+--------+--------+--------+--------+ ICA Distal58      23                                +----------+--------+--------+--------+--------+--------+ ECA       53      4                                +----------+--------+--------+--------+--------+--------+ +----------+--------+--------+----------------+------------+ SubclavianPSV cm/sEDV cm/sDescribe        Arm Pressure +----------+--------+--------+----------------+------------+           88              Multiphasic, WNL             +----------+--------+--------+----------------+------------+ +---------+--------+--+--------+--+---------+ VertebralPSV cm/s53EDV cm/s21Antegrade +---------+--------+--+--------+--+---------+  ABI Findings: +--------+------------------+-----+---------+--------+ Right   Rt Pressure (mmHg)IndexWaveform Comment  +--------+------------------+-----+---------+--------+ [id number]                    triphasic         +--------+------------------+-----+---------+--------+ +--------+------------------+-----+---------+-------+ Left  Lt Pressure (mmHg)IndexWaveform Comment +--------+------------------+-----+---------+-------+ [id number]                    triphasic        +--------+------------------+-----+---------+-------+  Right Doppler Findings: +--------+--------+-----+---------+--------+ Site    PressureIndexDoppler  Comments +--------+--------+-----+---------+--------+ [id number]          triphasic         +--------+--------+-----+---------+--------+ Radial               triphasic         +--------+--------+-----+---------+--------+ Ulnar                triphasic         +--------+--------+-----+---------+--------+  Left Doppler Findings: +--------+--------+-----+---------+--------+ Site    PressureIndexDoppler  Comments +--------+--------+-----+---------+--------+ [id number]          triphasic         +--------+--------+-----+---------+--------+ Radial               triphasic          +--------+--------+-----+---------+--------+ Ulnar                triphasic         +--------+--------+-----+---------+--------+   Summary: Right Carotid: The extracranial vessels were near-normal with only minimal wall                thickening or plaque. Left Carotid: The extracranial vessels were near-normal with only minimal wall               thickening or plaque. Vertebrals:  Bilateral vertebral arteries demonstrate antegrade flow. Subclavians: Normal flow hemodynamics were seen in bilateral subclavian              arteries. Bilateral Extremity: Doppler waveforms remain within normal limits with compression bilaterally for the radial arteries. Doppler waveforms remain within normal limits with compression bilaterally for the ulnar arteries.      Preliminary    DG CHEST PORT 1 VIEW  Result Date: 09/12/2021 CLINICAL DATA:  [id number].  Respiratory failure with hypoxia. EXAM: PORTABLE CHEST 1 VIEW COMPARISON:  Portable chest yesterday at 4:49 a.m. FINDINGS: 5:02 a.m. Interstitial and faint patchy hazy opacities in the peripheral mid to lower lungs continue to be noted. The lungs are otherwise generally clear with emphysematous changes. Cardiomegaly. No vascular congestion is seen. 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 No pleural effusion is seen. Thoracic dextroscoliosis. No appreciable new abnormality. IMPRESSION: Peripheral hazy opacities are unchanged with underlying interstitial thickening. Overall aeration is stable. No new abnormality. Electronically Signed   By: Telford Nab M.D.   On: 09/12/2021 07:32   ECHOCARDIOGRAM COMPLETE  Result Date: 09/11/2021    ECHOCARDIOGRAM REPORT   Patient Name:   Jason Abbott. Date of Exam: 09/11/2021 Medical Rec #:  [id number]           Height:       71.0 in Accession #:    [id number]          Weight:       178.6 lb Date of Birth:  Mar 05, 1945           BSA:          2.009 m Patient Age:     77 years            BP:           124/86 mmHg Patient Gender: M  HR:           76 bpm. Exam Location:  Inpatient Procedure: 2D Echo Indications:    Aortic Arch Aneursym  History:        Patient has prior history of Echocardiogram examinations, most                 recent 09/03/2021. Stroke, Signs/Symptoms:Shortness of Breath;                 Risk Factors:Dyslipidemia.  Sonographer:    Harvie Junior Referring Phys: [place]  1. Left ventricular ejection fraction, by estimation, is 55 to 60%. The left ventricle has normal function. The left ventricle has no regional wall motion abnormalities. Left ventricular diastolic parameters are indeterminate.  2. Right ventricular systolic function is normal. The right ventricular size is mildly enlarged. There is normal pulmonary artery systolic pressure.  3. Right atrial size was severely dilated.  4. The mitral valve is normal in structure. No evidence of mitral valve regurgitation. No evidence of mitral stenosis.  5. The aortic valve is tricuspid. Aortic valve regurgitation is not visualized. Aortic valve sclerosis is present, with no evidence of aortic valve stenosis.  6. There is moderate dilatation of the aortic root, measuring 43 mm.  7. The inferior vena cava is normal in size with greater than 50% respiratory variability, suggesting right atrial pressure of 3 mmHg. FINDINGS  Left Ventricle: Left ventricular ejection fraction, by estimation, is 55 to 60%. The left ventricle has normal function. The left ventricle has no regional wall motion abnormalities. The left ventricular internal cavity size was normal in size. There is  no left ventricular hypertrophy. Left ventricular diastolic parameters are indeterminate. Right Ventricle: The right ventricular size is mildly enlarged. No increase in right ventricular wall thickness. Right ventricular systolic function is normal. There is normal pulmonary artery systolic pressure. The  tricuspid regurgitant velocity is 2.28  m/s, and with an assumed right atrial pressure of 3 mmHg, the estimated right ventricular systolic pressure is 67.5 mmHg. Left Atrium: Left atrial size was normal in size. Right Atrium: Right atrial size was severely dilated. Pericardium: There is no evidence of pericardial effusion. Presence of epicardial fat layer. Mitral Valve: The mitral valve is normal in structure. No evidence of mitral valve regurgitation. No evidence of mitral valve stenosis. Tricuspid Valve: The tricuspid valve is normal in structure. Tricuspid valve regurgitation is not demonstrated. No evidence of tricuspid stenosis. Aortic Valve: The aortic valve is tricuspid. Aortic valve regurgitation is not visualized. Aortic valve sclerosis is present, with no evidence of aortic valve stenosis. Aortic valve mean gradient measures 3.5 mmHg. Aortic valve peak gradient measures 5.6  mmHg. Aortic valve area, by VTI measures 2.92 cm. Pulmonic Valve: The pulmonic valve was normal in structure. Pulmonic valve regurgitation is not visualized. No evidence of pulmonic stenosis. Aorta: There is moderate dilatation of the aortic root, measuring 43 mm. Venous: The inferior vena cava is normal in size with greater than 50% respiratory variability, suggesting right atrial pressure of 3 mmHg. IAS/Shunts: No atrial level shunt detected by color flow Doppler.  LEFT VENTRICLE PLAX 2D LVIDd:         5.20 cm      Diastology LVIDs:         3.60 cm      LV e' medial:    7.18 cm/s LV PW:         1.00 cm      LV E/e' medial:  6.6 LV IVS:        0.90 cm      LV e' lateral:   10.20 cm/s LVOT diam:     2.30 cm      LV E/e' lateral: 4.6 LV SV:         61 LV SV Index:   30 LVOT Area:     4.15 cm  LV Volumes (MOD) LV vol d, MOD A2C: 85.4 ml LV vol d, MOD A4C: 106.0 ml LV vol s, MOD A2C: 36.8 ml LV vol s, MOD A4C: 46.9 ml LV SV MOD A2C:     48.6 ml LV SV MOD A4C:     106.0 ml LV SV MOD BP:      60.6 ml RIGHT VENTRICLE RV Basal diam:  4.40  cm RV Mid diam:    4.00 cm RV S prime:     28.80 cm/s TAPSE (M-mode): 3.0 cm LEFT ATRIUM             Index        RIGHT ATRIUM           Index LA diam:        3.70 cm 1.84 cm/m   RA Area:     31.80 cm LA Vol (A2C):   48.2 ml 23.99 ml/m  RA Volume:   118.00 ml 58.72 ml/m LA Vol (A4C):   53.6 ml 26.67 ml/m LA Biplane Vol: 52.6 ml 26.18 ml/m  AORTIC VALVE                    PULMONIC VALVE AV Area (Vmax):    2.67 cm     PV Vmax:          1.14 m/s AV Area (Vmean):   2.58 cm     PV Peak grad:     5.2 mmHg AV Area (VTI):     2.92 cm     PR End Diast Vel: 2.84 msec AV Vmax:           118.50 cm/s AV Vmean:          83.800 cm/s AV VTI:            0.208 m AV Peak Grad:      5.6 mmHg AV Mean Grad:      3.5 mmHg LVOT Vmax:         76.20 cm/s LVOT Vmean:        52.100 cm/s LVOT VTI:          0.146 m LVOT/AV VTI ratio: 0.70  AORTA Ao Root diam: 4.30 cm Ao Asc diam:  3.30 cm MITRAL VALVE               TRICUSPID VALVE MV Area (PHT): 6.07 cm    TR Peak grad:   20.8 mmHg MV Decel Time: 125 msec    TR Vmax:        228.00 cm/s MR Peak grad: 11.6 mmHg MR Vmax:      170.00 cm/s  SHUNTS MV E velocity: 47.10 cm/s  Systemic VTI:  0.15 m MV A velocity: 70.80 cm/s  Systemic Diam: 2.30 cm MV E/A ratio:  0.67 Kardie Tobb DO Electronically signed by Berniece Salines DO Signature Date/Time: 09/11/2021/4:02:44 PM    Final    DG CHEST PORT 1 VIEW  Result Date: 09/11/2021 CLINICAL DATA:  [id number].  Shortness of breath. EXAM: PORTABLE CHEST 1 VIEW COMPARISON:  Portable chest and CTA chest both 09/09/21 FINDINGS:  4:49 a.m.  There is mild cardiomegaly without findings of CHF. 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 There are patchy peripheral hazy opacities of the mid to lower lung fields concerning for viral or atypical multilobar pneumonia. The The remaining lungs clear with COPD change. The sulci  are sharp. Mild thoracic dextroscoliosis and osteopenia. IMPRESSION: Increased patchy peripheral haziness in the mid to lower lung fields concerning for multilobar pneumonia including viral or atypical etiologies. Electronically Signed   By: Telford Nab M.D.   On: 09/11/2021 07:32    Scheduled Meds:  azithromycin  500 mg Oral QHS   guaiFENesin  1,200 mg Oral BID   methylPREDNISolone (SOLU-MEDROL) injection  40 mg Intravenous Daily   mometasone-formoterol  2 puff Inhalation BID   And   umeclidinium bromide  1 puff Inhalation Daily   tamsulosin  0.4 mg Oral Daily   Continuous Infusions:  cefTRIAXone (ROCEPHIN)  IV Stopped (09/12/21 0017)    LOS: 2 days   Raiford Noble, DO [facility] Available via Epic secure chat 7am-7pm After these hours, please refer to coverage provider listed on [email or web address] 09/12/2021, 1:51 PM

## 2021-09-12 NOTE — Progress Notes (Signed)
Pre-CABG exam has been completed  Results can be found under chart review under CV PROC. 09/12/2021 1:30 PM Emileo Semel RVT, RDMS

## 2021-09-12 NOTE — Progress Notes (Signed)
SATURATION QUALIFICATIONS: (This note is used to comply with regulatory documentation for home oxygen)  Patient Saturations on Room Air at Rest = 89%  Patient Saturations on Room Air while Ambulating = n/a%  Patient Saturations on 2 Liters of oxygen while Ambulating = 95%  Please briefly explain why patient needs home oxygen:

## 2021-09-12 NOTE — Consult Note (Signed)
Mr. Jason Abbott is well-known to me.  He is a very nice 76 year old white male.  He has a history of stage A CLL.  We have been following him for about a year.  We just saw him back in late August.  When we saw him back in August, his white cell count was holding steady.  More importantly was the fact that he did not have hypogammaglobulinemia.  His immunoglobulin levels were normal.  Apparently, he was visiting his sister who was dying.  His sister had pneumonia.  He thinks is where he may have gotten this.  He was treated initially as an outpatient.  He has been on different antibiotic regimens.  However, whenever he stopped, his breathing got worse.  He ultimately ended up in the hospital.  When he came in, he had a CT angiogram of the chest.  This did not show any pulmonary embolism.  He really had no mediastinal or hilar adenopathy.  He had a aortic aneurysm.  He had pneumonia bilaterally.  Has been on antibiotics.  He has had cultures taken.  So far, cultures have been negative.  When he was admitted on 09/09/2021, his white cell count was 17.1.  Hemoglobin 14 platelet count 90,000.  He is on some steroids.  Yesterday, his white count was 11.2.  Hemoglobin 13.8.  Platelet count 90,000.  Today, his white cell count is 22.3.  Hemoglobin 13.5.  Platelet count 107,000.  His sodium is 135.  Potassium 4.4.  BUN 19 creatinine 0.89.  His albumin is 3.1.  Liver function studies are normal.  He looks pretty good.  He sounds pretty good.  He is on supplemental oxygen right now.  He has not coughed up any purulent sputum.  There is been no obvious fever.  He has had no bleeding.  There is no rashes.  He has had no leg swelling.  His temperature is 97.7.  Pulse 65.  Blood pressure 130/80.  Oxygen saturation on 2 L is 96%.  His lungs sound pretty clear bilaterally.  I hear no wheezing.  He has decent air movement bilaterally.  Cardiac exam regular rate and rhythm.  Abdomen is soft.  He has decent bowel  sounds.  There is no fluid wave.  There is no palpable hepatosplenomegaly.  Extremities shows no clubbing, cyanosis or edema.  Neurological exam is nonfocal.  I really do not think that his CLL is a major factor with respect to him having pneumonia.  I would think that his immune system is relatively competent.  Again the big issue is whether or not he has hypogammaglobulinemia.  He does not.  On his CT angiogram, it looks like there is some changes of underlying COPD.  This could certainly be the risk factor for him having pneumonia.  Looks like he is going need to have an aneurysm fixed.  He kept talking about that this morning.  I do not believe he needs IVIG.  He is on antibiotics.  Is on steroids.  Hopefully, he will be able to be switched over to oral antibiotics.  I know he is getting incredible care from all staff on 2 W.  We will follow along and help any way that we can.  Jimmye Norman, MD  1 Samuel 2:2

## 2021-09-13 ENCOUNTER — Other Ambulatory Visit (HOSPITAL_COMMUNITY): Payer: Self-pay

## 2021-09-13 ENCOUNTER — Inpatient Hospital Stay (HOSPITAL_COMMUNITY): Payer: Medicare Other

## 2021-09-13 DIAGNOSIS — R252 Cramp and spasm: Secondary | ICD-10-CM

## 2021-09-13 DIAGNOSIS — E871 Hypo-osmolality and hyponatremia: Secondary | ICD-10-CM

## 2021-09-13 DIAGNOSIS — I7122 Aneurysm of the aortic arch, without rupture: Secondary | ICD-10-CM | POA: Diagnosis not present

## 2021-09-13 DIAGNOSIS — J189 Pneumonia, unspecified organism: Secondary | ICD-10-CM | POA: Diagnosis not present

## 2021-09-13 DIAGNOSIS — J9601 Acute respiratory failure with hypoxia: Secondary | ICD-10-CM | POA: Diagnosis not present

## 2021-09-13 DIAGNOSIS — C9110 Chronic lymphocytic leukemia of B-cell type not having achieved remission: Secondary | ICD-10-CM | POA: Diagnosis not present

## 2021-09-13 DIAGNOSIS — N400 Enlarged prostate without lower urinary tract symptoms: Secondary | ICD-10-CM

## 2021-09-13 DIAGNOSIS — E8809 Other disorders of plasma-protein metabolism, not elsewhere classified: Secondary | ICD-10-CM

## 2021-09-13 LAB — COMPREHENSIVE METABOLIC PANEL
ALT: 19 U/L (ref 0–44)
AST: 16 U/L (ref 15–41)
Albumin: 3 g/dL — ABNORMAL LOW (ref 3.5–5.0)
Alkaline Phosphatase: 52 U/L (ref 38–126)
Anion gap: 9 (ref 5–15)
BUN: 22 mg/dL (ref 8–23)
CO2: 26 mmol/L (ref 22–32)
Calcium: 9.6 mg/dL (ref 8.9–10.3)
Chloride: 102 mmol/L (ref 98–111)
Creatinine, Ser: 0.91 mg/dL (ref 0.61–1.24)
GFR, Estimated: >60 mL/min (ref >60)
Glucose, Bld: 93 mg/dL (ref 70–99)
Potassium: 4.5 mmol/L (ref 3.5–5.1)
Sodium: 137 mmol/L (ref 135–145)
Total Bilirubin: 0.6 mg/dL (ref 0.3–1.2)
Total Protein: 5.3 g/dL — ABNORMAL LOW (ref 6.5–8.1)

## 2021-09-13 LAB — CBC WITH DIFFERENTIAL/PLATELET
Abs Immature Granulocytes: 0 10*3/uL (ref 0.00–0.07)
Basophils Absolute: 0 10*3/uL (ref 0.0–0.1)
Basophils Relative: 0 %
Eosinophils Absolute: 0 10*3/uL (ref 0.0–0.5)
Eosinophils Relative: 0 %
HCT: 40.1 % (ref 39.0–52.0)
Hemoglobin: 13.4 g/dL (ref 13.0–17.0)
Lymphocytes Relative: 68 %
Lymphs Abs: 17.3 10*3/uL — ABNORMAL HIGH (ref 0.7–4.0)
MCH: 31.4 pg (ref 26.0–34.0)
MCHC: 33.4 g/dL (ref 30.0–36.0)
MCV: 93.9 fL (ref 80.0–100.0)
Monocytes Absolute: 0.5 10*3/uL (ref 0.1–1.0)
Monocytes Relative: 2 %
Neutro Abs: 7.6 10*3/uL (ref 1.7–7.7)
Neutrophils Relative %: 30 %
Platelets: 108 10*3/uL — ABNORMAL LOW (ref 150–400)
RBC: 4.27 MIL/uL (ref 4.22–5.81)
RDW: 13.5 % (ref 11.5–15.5)
WBC: 25.4 10*3/uL — ABNORMAL HIGH (ref 4.0–10.5)
nRBC: 0.1 % (ref 0.0–0.2)

## 2021-09-13 LAB — PHOSPHORUS: Phosphorus: 4 mg/dL (ref 2.5–4.6)

## 2021-09-13 LAB — MAGNESIUM: Magnesium: 2.1 mg/dL (ref 1.7–2.4)

## 2021-09-13 MED ORDER — ACETAMINOPHEN 325 MG PO TABS
650.0000 mg | ORAL_TABLET | Freq: Four times a day (QID) | ORAL | 0 refills | Status: DC | PRN
  Filled 2021-09-13: qty 20, 3d supply, fill #0

## 2021-09-13 MED ORDER — GUAIFENESIN ER 600 MG PO TB12
600.0000 mg | ORAL_TABLET | Freq: Two times a day (BID) | ORAL | 0 refills | Status: AC
Start: 2021-09-13 — End: 2021-09-18
  Filled 2021-09-13: qty 10, 5d supply, fill #0

## 2021-09-13 MED ORDER — PREDNISONE 10 MG PO TABS
ORAL_TABLET | ORAL | 0 refills | Status: DC
Start: 2021-09-13 — End: 2021-10-03
  Filled 2021-09-13: qty 50, 20d supply, fill #0

## 2021-09-13 MED ORDER — ONDANSETRON HCL 4 MG PO TABS
4.0000 mg | ORAL_TABLET | Freq: Four times a day (QID) | ORAL | 0 refills | Status: DC | PRN
  Filled 2021-09-13: qty 20, 5d supply, fill #0

## 2021-09-13 MED ORDER — MUSCLE RUB 10-15 % EX CREA
1.0000 | TOPICAL_CREAM | CUTANEOUS | 0 refills | Status: DC | PRN
  Filled 2021-09-13: qty 85, 30d supply, fill #0

## 2021-09-13 MED ORDER — CEFDINIR 300 MG PO CAPS
300.0000 mg | ORAL_CAPSULE | Freq: Two times a day (BID) | ORAL | 0 refills | Status: AC
  Filled 2021-09-13: qty 8, 4d supply, fill #0

## 2021-09-13 MED ORDER — AZITHROMYCIN 500 MG PO TABS
500.0000 mg | ORAL_TABLET | Freq: Every day | ORAL | 0 refills | Status: DC
  Filled 2021-09-13: qty 2, 2d supply, fill #0

## 2021-09-13 NOTE — Progress Notes (Signed)
Mobility Specialist Progress Note:   09/13/21 0952  Oxygen Therapy  O2 Device Nasal Cannula  O2 Flow Rate (L/min) 2 L/min  Mobility  Activity Ambulated independently in hallway  Level of Assistance Modified independent, requires aide device or extra time  Assistive Device Other (Comment);None  Distance Ambulated (ft) 450 ft  Activity Response Tolerated well  $Mobility charge 1 Mobility   Pt received in bed and agreeable. No complaints. Pt left ambulating in room with all needs met and call bell in reach. Pt requested second walk later in day, will f/u as able.     Mobility Specialist-Acute Rehab Secure Chat only  

## 2021-09-13 NOTE — TOC Initial Note (Addendum)
Transition of Care (TOC) - Initial/Assessment Note    Patient Details  Name: Jason Abbott. MRN: [id number] Date of Birth: 1945/06/20  Transition of [facility]) CM/SW Contact:    Curlene Labrum, RN Phone Number: 09/13/2021, 11:06 AM  Clinical Narrative:                 CM met with the patient and wife at the bedside to discuss transitions of care to home today following ambulation trials for oxygen needs for home.  The patient states that he has a home pulse oximeter but no longer has home oxygen at his home.  The patient states that he would like medications filled at the hospital since he has pending discharge to home waiting for oxygen needs at this time and [facility] pm.  MD aware.  Discussed with patient regarding home health RN if patient requires oxygen for home - and patient did not have a preference.  I will call to obtain [facility] RN only if patient is discharged home on home oxygen.  PCP - patient plans to follow up with Dr. Kem Boroughs in [place], [place].  The patient's wife will be providing transportation to home by car today.  09/13/2021 1148 - Patient has DMe needs for home oxygen - [employer] was called for home oxygen and portable tank to be livered to the patient's hospital room prior to discharge to home.  The patient and wife were given Medicare choice regarding home health and they did not have a preference.  [facility] was called and they have accepted the patient for home health RN and will contact the patient for start of services.  TOC medications will be delivered to the patient's room today.  Expected Discharge Plan: Home/Self Care Barriers to Discharge: Continued Medical Work up (pending needs for oxygen for home.)   Patient Goals and CMS Choice Patient states their goals for this hospitalization and ongoing recovery are:: to get better and return home CMS Medicare.gov Compare Post Acute Care list provided to:: Patient Choice offered to /  list presented to : Patient, Spouse  Expected Discharge Plan and Services Expected Discharge Plan: Home/Self Care   Discharge Planning Services: CM Consult   Living arrangements for the past 2 months: Single Family Home Expected Discharge Date: 09/13/21                                    Prior Living Arrangements/Services Living arrangements for the past 2 months: Single Family Home Lives with:: Spouse Patient language and need for interpreter reviewed:: Yes Do you feel safe going back to the place where you live?: Yes      Need for Family Participation in Patient Care: Yes (Comment) Care giver support system in place?: Yes (comment) Current home services: DME (Home pulse oximeter) Criminal Activity/Legal Involvement Pertinent to Current Situation/Hospitalization: No - Comment as needed  Activities of Daily Living Home Assistive Devices/Equipment: None ADL Screening (condition at time of admission) Patient's cognitive ability adequate to safely complete daily activities?: Yes Is the patient deaf or have difficulty hearing?: No Does the patient have difficulty seeing, even when wearing glasses/contacts?: No Does the patient have difficulty concentrating, remembering, or making decisions?: No Patient able to express need for assistance with ADLs?: Yes Does the patient have difficulty dressing or bathing?: No Independently performs ADLs?: No Communication: Independent Dressing (OT): Independent Grooming: Independent Feeding: Independent  Bathing: Independent Toileting: Independent In/Out Bed: Independent Walks in Home: Independent Does the patient have difficulty walking or climbing stairs?: No Weakness of Legs: None Weakness of Arms/Hands: None  Permission Sought/Granted Permission sought to share information with : Case Manager, Family Supports, PCP Permission granted to share information with : Yes, Verbal Permission Granted     Permission granted to share info  w AGENCY: TOC pharmacy for medications, home health for RN only if home oxygen is needed  Permission granted to share info w Relationship: Quanta Roher - wife - [phone]     Emotional Assessment Appearance:: Appears stated age Attitude/Demeanor/Rapport: Gracious Affect (typically observed): Accepting Orientation: : Oriented to Self, Oriented to Place, Oriented to  Time, Oriented to Situation Alcohol / Substance Use: Not Applicable (Quit smoking years ago per patient.) Psych Involvement: No (comment)  Admission diagnosis:  Shortness of breath [R06.02] Acute respiratory failure with hypoxia ([place]) [J96.01] Patient Active Problem List   Diagnosis Date Noted   Acute respiratory failure with hypoxia ([place]) 09/09/2021   Thrombocytopenia ([place]) 09/09/2021   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   Ingrown right greater toenail 08/12/2021   Chronic headaches 08/12/2021   Right hand pain 03/11/2021   Tinnitus 03/11/2021   Elevated TSH 09/12/2020   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   Community acquired bilateral lower lobe pneumonia 03/16/2019   CLL (chronic lymphocytic leukemia) ([place]) 02/23/2019   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Phone: [phone]: [phone]    Social Determinants of Health (SDOH) Interventions    Readmission  Risk Interventions    09/13/2021   11:06 AM  Readmission Risk Prevention Plan  Transportation Screening Complete  PCP or Specialist Appt within 5-7 Days Complete  Home Care Screening Complete  Medication Review (RN CM) Complete

## 2021-09-13 NOTE — Discharge Summary (Signed)
Physician Discharge Summary   Patient: Jason Abbott. MRN: [id number] DOB: 01/30/1945  Admit date:     09/09/2021  Discharge date: 09/13/21  Discharge Physician: Raiford Noble, DO   PCP: Silverio Decamp, MD   Recommendations at discharge:   Follow-up with PCP within 1 to 2 weeks and repeat CBC, CMP, mag, Phos within 1 week Follow-up with pulmonary in outpatient setting within 1 to 2 weeks and repeat chest x-ray and outpatient PFTs perioperatively for aortic arch repair Follow-up with medical oncology Dr. Marin Olp in the outpatient setting continue to monitor and repeat CBC within 1 week Follow-up with cardiothoracic surgery Dr. Darcey Nora for outpatient aortic arch repair  Discharge Diagnoses: Principal Problem:   Acute respiratory failure with hypoxia ([place]) Active Problems:   Community acquired bilateral lower lobe pneumonia   Aortic arch aneurysm (HCC)   CLL (chronic lymphocytic leukemia) (HCC)   Thrombocytopenia (HCC)   Hyponatremia   Hypoalbuminemia   BPH (benign prostatic hyperplasia)   Leg cramping  Resolved Problems:   * No resolved hospital problems. 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  He was noted to have room air saturations of 80s on his home pulse oximetry and was initially seen at the beginning part of August for right-sided pneumonia and was treated with 5 days of p.o. azithromycin and he felt improved.  Subsequently he went to urgent care on 08/24/2021 and then was prescribed Levaquin however given his history of aortic aneurysm this was changed to  doxycycline and he completed 10 days of the doxycycline and prednisone 5 days ago but since then he has had worsening shortness of breath and cough.  Has not had any recurrent fevers.  He has noted that with mild exertion that his O2 saturations dropped in the 80s and he has had a home pulse oximeter that he acquired when he had COVID 2 years ago.     In the ED he had a temperature of 100.3 and a heart rate of 79 a blood pressure of 132/81 and saturating 88% on room air.  He is started on supplemental oxygen and COVID testing was negative.  White blood cell count elevated 17.1 and BNP of 73.  CT scan of the chest was done to evaluate for PE and showed "No CT evidence of central acute pulmonary artery embolus. Left-sided aortic arch with aberrant right subclavian artery anatomy. No periaortic fluid collection. 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 Follow-up as per  recommendation of the prior CT with cardiothoracic surgery consult. Aortic Atherosclerosis and Emphysema."   CT scan did mention "Diffuse interstitial thickening and haziness primarily involving the left lung common new since the prior CT and may represent edema or infiltrate. No consolidative changes. There is no pleural effusion pneumothorax.The central airways are patent."   Pulmonary Evaluated and felt that his right lower lobe streaky infiltrate was more likely atelectasis.  His respiratory virus panel was negative.  Pulmonary recommends continuing IV ceftriaxone azithromycin and steroids empirically starting him on steroids setting of Solu-Medrol 40 mg IV for 10 days and then  transitioning to oral prednisone milligrams daily tapering by 10 mg every 5 days to wean off.   He is slowly improving and attempts to wean him off oxygen were unsuccessful today as he desaturated.  He was placed back on supplemental oxygen.  Currently cardiothoracic surgery has ordered a vascular ultrasound for pre-CABG  evaluation given that he is going to go for an aortic aneurysm repair next month.  Dr. Darcey Nora recommended no further inpatient testing and he improved.  Patient had an ambulatory home O2 screen done prior to discharge and he did not desaturate so he will qualify for supplemental oxygen.  Case was discussed with pulmonary and they will follow-up with the patient outpatient very closely and he has an appointment on 09/23/2021 at 2 PM with Dr. Valeta Harms.  He is medically stable to be discharged at this time and will need to follow-up with PCP, cardiothoracic surgery, medical oncology as well as pulmonary in outpatient setting.   Assessment and Plan: -Admitted to med/surg bed as Observation.  -SpO2: 94 % O2 Flow Rate (L/min): 2 L/min -Continuous pulse oximetry maintain O2 saturation greater than 90% -Continue Dulera plus Incruse Ellipta nebs -Continue supplemental oxygen via nasal cannula wean oxygen to room air -Repeat chest x-ray in the a.m. and an ambulatory home O2 screen prior to discharge as the patient does not wear supplemental oxygen at home; he did desaturate and will need supplemental oxygen at home -See Below    Community Acquired Bilateral lower lobe pneumonia with underlying COPD -Pt initially on Zithromax for 5 days in beginning of August 2023. He took that for 5 days. Did not improve.  -Seen in Urgent Care  and placed on Levaquin but due to his hx of aortic arch aneurysm, this was changed to doxycycline. Pt completed 10 days course of this 5 days ago. Since stopping doxycycline, he has felt worse.  -Now hypoxic and has developed LLL pneumonia as well.   -Given his immunosuppressed status with CLL, will continue IV Rocephin/Azithromycin.  -May need a full 14 days of po abx given his hx of CLL. -See above -We will add guaifenesin 1200 mg p.o. twice daily, flutter valve and incentive spirometry -We will obtain Strep Urine Ag and Legionella Pneumophilia Ur Ag -WBC went from 17.1 -> 21.9  -> 11.2 and is now trended up to 22.3 yesterday and today is 25.4 -BNP was 73.1 -Lactic acid level was not elevated and was 1.1 and repeat was 1.2; procalcitonin level was less than 0.10 -We will try and have a sputum culture sent off -SARS-CoV-2 testing was negative -Blood cultures x2 show NGTD at 3 days -Will involve Pulmonary given prolonged illness and they are recommending continuing empiric antibiotics with IV azithromycin and ceftriaxone.  Pulmonary is also recommended following up on the urine strep Legionella antigen and also recommending starting empiric steroids with IV Solu-Medrol 40 mg daily for 2 days and then transition to prednisone 40 mg/day and then tapering by 10 mg every 5 days till weaning off; patient received his second day of Solu-Medrol and will be transition to oral prednisone tomorrow morning; prednisone taper has been written and he has been changed to p.o. antibiotics with cefdinir and azithromycin for completion of his course -OOB as tolerated -Repeat CXR on 09/11/21 showed "Increased patchy peripheral haziness in the mid to lower lung fields concerning for multilobar pneumonia including viral or atypical etiologies." -Repeat CXR yesterday showed "Peripheral hazy opacities are unchanged with underlying interstitial thickening. Overall aeration is stable. No new  abnormality." -Chest x-ray today showed "Bibasilar atelectasis.  No new airspace opacity.  -Finally had a flutter valve. -See below but pulmonary has signed off the case and feels that if he gets worse or his radiograph does not improve they could consider a bronchoscopy but for now he is clinically improving -Pulmonary recommends repeating CT scan of the chest in 6 weeks to ensure clearance of his infiltrates and recommends continuing antibiotics and steroids -Ambulatory home O2 screen done today and patient desaturated; patient will need supplemental oxygen for discharge and close pulmonary follow-up in outpatient  setting   CLL (chronic lymphocytic leukemia) ([place]) -Chronic.  -Follows with Hematology/Oncology and will notify Dr. Marin Olp of patient's Hospitalization -Recent beta-2 microglobulin was 2.7 with an IgG level of 808, IgM level of 39, and an IgA level 149 back on 08/26/2021 -Patient's WBC has gone from 17.1 -> 21.9 -> 11.2 -> 22.3 and is now 25.4 (In the setting of Steroid Demargination)  -Continue monitor and trend and repeat CBC in a.m -Medical oncology evaluated and they do not feel that his CLL is a major factor with respect to him having pneumonia and they think that his immune system is relatively competent and he felt that he does not have hypogammaglobulinemia and they do not feel that he needs IVIG. -Pulm is recommending repeating immunoglobulin in the outpatient setting and repeating a CT scan -Follow-up with pulm within 1 to 2 weeks   Aortic Arch Aneurysm ([place]) -Stable.  -Pt and wife states he has upcoming appointment with vascular surgery to schedule aortic arch repair. -Per CT Scan "Left-sided aortic arch with aberrant right subclavian artery anatomy. No periaortic fluid collection. 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 Follow-up as per recommendation of the prior CT with cardiothoracic surgery consult." -Cardiothoracic surgery is ordering an echocardiogram for this patient -Scheduled for Aortic Arch Surgery in October -Currently cardiothoracic surgery has been getting his preoperative test done and Dr. Darcey Nora has recommended obtaining a echocardiogram as well as a vascular ultrasound Doppler pre-CABG -Echocardiogram done and showed:   "1. Left ventricular ejection fraction, by estimation, is 55 to 60%. The  left ventricle has normal function. The left ventricle has no regional  wall motion abnormalities. Left ventricular diastolic parameters are  indeterminate.   2. Right ventricular systolic function is normal. The right  ventricular  size is mildly enlarged. There is normal pulmonary artery systolic  pressure.   3. Right atrial size was severely dilated.   4. The mitral valve is normal in structure. No evidence of mitral valve  regurgitation. No evidence of mitral stenosis.   5. The aortic valve is tricuspid. Aortic valve regurgitation is not  visualized. Aortic valve sclerosis is present, with no evidence of aortic  valve stenosis.   6. There is moderate dilatation of the aortic root, measuring 43 mm.   7. The inferior vena cava is normal in size with greater than 50%  respiratory variability, suggesting right atrial pressure of 3 mmHg."   -Cardiothoracic Surgery is also obtaining Vascular U/S Doppler Pre-CABG and this can be followed by Dr. Darcey Nora in outpatient setting. -Patient is to follow-up with Dr. Darcey Nora and have a aortic arch aneurysm in the outpatient setting   Hyponatremia -Patient's sodium went from 133 is now 136 x2 -> 135 is now 137 -Continue monitor and trend and repeat CMP in a.m.   Hypoalbuminemia -Patient's albumin level went from 3.3 -> 3.2 -> 2.8 -> 3.1 and  is now 3.0 Frequency monitor and trend and repeat CMP in a.m.   BPH -Continue with Tamsulosin 0.4 mg p.o. daily   Thrombocytopenia (HCC) -New onset.   -Patient's platelet count has been dropping since 8/21 and went from 146 -> 90 -> 77 -> 90 and is now improved to 107 yesterday and is now 86 -May be due to his acute illness with pneumonia.   -SCDs for prophylaxis.  No heparin or Lovenox currently given his continued dropping platelet count -Continue to monitor for signs and symptoms bleeding; no overt bleeding noted- -Repeat CBC within 1 week   Leg cramping, improved -Check magnesium level and give a dose of IV mag sulfate 1 g times once -Patient tried mustard packets x2 and did not help -We will order ibuprofen 400 mg x 1 -Recommending stretching and massaging the area -Order Muscle Rub Cream and K Pad for the  cramping   Consultants: Pulmonary, medical oncology Procedures performed: As needed as above Disposition: Home health Diet recommendation:  Discharge Diet Orders (From admission, onward)     Start     Ordered   09/13/21 0000  Diet - low sodium heart healthy        09/13/21 1057           Cardiac diet DISCHARGE MEDICATION: Allergies as of 09/13/2021       Reactions   Levaquin [levofloxacin] Other (See Comments)   Due to medical hx        Medication List     TAKE these medications    acetaminophen 325 MG tablet Commonly known as: TYLENOL Take 2 tablets (650 mg total) by mouth every 6 (six) hours as needed for mild pain (or Fever >/= 101).   azithromycin 500 MG tablet Commonly known as: ZITHROMAX Take 1 tablet (500 mg total) by mouth at bedtime.   Breztri Aerosphere 160-9-4.8 MCG/ACT Aero Generic drug: Budeson-Glycopyrrol-Formoterol Inhale 2 puffs into the lungs 2 (two) times daily.   cefdinir 300 MG capsule Commonly known as: OMNICEF Take 1 capsule (300 mg total) by mouth 2 (two) times daily for 4 days.   Magnesium Citrate 200 MG Tabs Take 400 mg by mouth daily.   Muscle Rub 10-15 % Crea Apply topically as needed for muscle pain.   ondansetron 4 MG tablet Commonly known as: ZOFRAN Take 1 tablet (4 mg total) by mouth every 6 (six) hours as needed for nausea.   pantoprazole 40 MG tablet Commonly known as: PROTONIX Take 40 mg by mouth daily.   predniSONE 10 MG tablet Commonly known as: DELTASONE Take 4 tablets (40 mg total) by mouth daily for 5 days, THEN 3 tablets (30 mg total) daily for 5 days, THEN 2 tablets (20 mg total) daily for 5 days, THEN 1 tablet (10 mg total) daily for 5 days. Start taking on: September 13, 2021   SM Mucus Relief 600 MG 12 hr tablet Generic drug: guaiFENesin Take 1 tablet (600 mg total) by mouth 2 (two) times daily for 5 days.   tamsulosin 0.4 MG Caps capsule Commonly known as: FLOMAX Take 0.4 mg by mouth daily.                Durable Medical Equipment  (From admission, onward)           Start     Ordered   09/13/21 1125  For home use only DME oxygen  Once       Question Answer Comment  Length of Need 6 Months  Mode or (Route) Nasal cannula   Liters per Minute 2   Frequency Continuous (stationary and portable oxygen unit needed)   Oxygen delivery system Gas      09/13/21 1124            Follow-up Information     Silverio Decamp, MD. Schedule an appointment as soon as possible for a visit.   Specialties: Family Medicine, Sports Medicine, Radiology Contact information: [address] [address] [phone]         June Leap L, DO Follow up on 09/23/2021.   Specialty: Pulmonary Disease Why: Dr Valeta Harms 2:00 pm. Contact information: [place] [address] [phone]         [facility] Follow up.   Specialty: Home Health Services Why: [facility] will call you and set up services for home health RN in the next 1-2 days after discharge home from the hospital. Contact information: [place] [address] [phone]         [facility] Follow up.   Why: [employer] will be providing you with home oxygen needs. Contact information: [place] [address] [phone]                Discharge Exam: Danley Danker Weights   09/10/21 1821 09/12/21 0439  Weight: 81 kg 81.6 kg   Vitals:   09/13/21 1125 09/13/21 1130  BP:    Pulse:    Resp:    Temp:    SpO2: (!) 84% 94%   Examination: Physical Exam:  Constitutional: WN/WD overweight Caucasian male currently no acute distress appears calm Respiratory: Diminished to auscultation bilaterally with coarse breath sounds at the bases, no wheezing, rales, rhonchi or crackles. Normal respiratory effort and patient is not tachypenic. No accessory muscle use.  Unlabored breathing Cardiovascular:  RRR, no murmurs / rubs / gallops. S1 and S2 auscultated. No extremity edema.  Abdomen: Soft, non-tender, slightly distended secondary to body habitus. Bowel sounds positive.  GU: Deferred. Musculoskeletal: No clubbing / cyanosis of digits/nails. No joint deformity upper and lower extremities.   Skin: No rashes, lesions, ulcers on limited skin evaluation. No induration; Warm and dry.  Neurologic: CN 2-12 grossly intact with no focal deficits.  Romberg sign and cerebellar reflexes not assessed.  Psychiatric: Normal judgment and insight. Alert and oriented x 3. Normal mood and appropriate affect.   Condition at discharge: stable  The results of significant diagnostics from this hospitalization (including imaging, microbiology, ancillary and laboratory) are listed below for reference.   Imaging Studies: DG CHEST PORT 1 VIEW  Result Date: 09/13/2021 CLINICAL DATA:  Shortness of breath EXAM: PORTABLE CHEST 1 VIEW COMPARISON:  Chest x-ray dated September 22, 2021 FINDINGS: Unchanged in mediastinal contours, including contour abnormality upper left mediastinum which is due to aneurysm when compared with prior CT. Bibasilar atelectasis. No large pleural effusion or pneumothorax. IMPRESSION: Bibasilar atelectasis.  No new airspace opacity. Electronically Signed   By: Yetta Glassman M.D.   On: 09/13/2021 08:24   VAS US DOPPLER PRE CABG  Result Date: 09/12/2021 PREOPERATIVE VASCULAR EVALUATION Patient Name:  Jason Abbott.  Date of Exam:   09/12/2021 Medical Rec #: [id number]            Accession #:    [id number] Date of Birth: 08-Aug-1945            Patient Gender: M Patient Age:  76 years Exam Location:  [facility] Procedure:      VAS US DOPPLER PRE CABG Referring Phys: Collier Salina VANTRIGT --------------------------------------------------------------------------------  Indications:      Pre-op (aortic arch repair). Risk Factors:     Hyperlipidemia, past history of smoking, prior CVA. Limitations:       UE doppler waveforms difficult to obtain due to irregular                   rhythm Comparison Study: No previous exams Performing Technologist: Hill, Jody RVT, RDMS  Examination Guidelines: A complete evaluation includes B-mode imaging, spectral Doppler, color Doppler, and power Doppler as needed of all accessible portions of each vessel. Bilateral testing is considered an integral part of a complete examination. Limited examinations for reoccurring indications may be performed as noted.  Right Carotid Findings: +----------+--------+--------+--------+--------+------------------+           PSV cm/sEDV cm/sStenosisDescribeComments           +----------+--------+--------+--------+--------+------------------+ CCA Prox  71      23                                         +----------+--------+--------+--------+--------+------------------+ CCA Distal81      32                      intimal thickening +----------+--------+--------+--------+--------+------------------+ ICA Prox  61      23                      intimal thickening +----------+--------+--------+--------+--------+------------------+ ICA Distal68      22                                         +----------+--------+--------+--------+--------+------------------+ ECA       45      1                                          +----------+--------+--------+--------+--------+------------------+ +----------+--------+-------+----------------+------------+           PSV cm/sEDV cmsDescribe        Arm Pressure +----------+--------+-------+----------------+------------+ Subclavian67             Multiphasic, WNL             +----------+--------+-------+----------------+------------+ +---------+--------+--+--------+--+---------+ VertebralPSV cm/s39EDV cm/s11Antegrade +---------+--------+--+--------+--+---------+ Left Carotid Findings: +----------+--------+--------+--------+--------+--------+           PSV cm/sEDV  cm/sStenosisDescribeComments +----------+--------+--------+--------+--------+--------+ CCA Prox  85      23                               +----------+--------+--------+--------+--------+--------+ CCA Distal74      20                               +----------+--------+--------+--------+--------+--------+ ICA Prox  64      20                               +----------+--------+--------+--------+--------+--------+ ICA Distal58      23                               +----------+--------+--------+--------+--------+--------+  ECA       53      4                                +----------+--------+--------+--------+--------+--------+ +----------+--------+--------+----------------+------------+ SubclavianPSV cm/sEDV cm/sDescribe        Arm Pressure +----------+--------+--------+----------------+------------+           88              Multiphasic, WNL             +----------+--------+--------+----------------+------------+ +---------+--------+--+--------+--+---------+ VertebralPSV cm/s53EDV cm/s21Antegrade +---------+--------+--+--------+--+---------+  ABI Findings: +--------+------------------+-----+---------+--------+ Right   Rt Pressure (mmHg)IndexWaveform Comment  +--------+------------------+-----+---------+--------+ [id number]                    triphasic         +--------+------------------+-----+---------+--------+ +--------+------------------+-----+---------+-------+ Left    Lt Pressure (mmHg)IndexWaveform Comment +--------+------------------+-----+---------+-------+ [id number]                    triphasic        +--------+------------------+-----+---------+-------+  Right Doppler Findings: +--------+--------+-----+---------+--------+ Site    PressureIndexDoppler  Comments +--------+--------+-----+---------+--------+ [id number]          triphasic         +--------+--------+-----+---------+--------+ Radial                triphasic         +--------+--------+-----+---------+--------+ Ulnar                triphasic         +--------+--------+-----+---------+--------+  Left Doppler Findings: +--------+--------+-----+---------+--------+ Site    PressureIndexDoppler  Comments +--------+--------+-----+---------+--------+ [id number]          triphasic         +--------+--------+-----+---------+--------+ Radial               triphasic         +--------+--------+-----+---------+--------+ Ulnar                triphasic         +--------+--------+-----+---------+--------+   Summary: Right Carotid: The extracranial vessels were near-normal with only minimal wall                thickening or plaque. Left Carotid: The extracranial vessels were near-normal with only minimal wall               thickening or plaque. Vertebrals:  Bilateral vertebral arteries demonstrate antegrade flow. Subclavians: Normal flow hemodynamics were seen in bilateral subclavian              arteries. Bilateral Extremity: Doppler waveforms remain within normal limits with compression bilaterally for the radial arteries. Doppler waveforms remain within normal limits with compression bilaterally for the ulnar arteries.  Electronically signed by Jamelle Haring on 09/12/2021 at 4:23:46 PM.    Final    DG CHEST PORT 1 VIEW  Result Date: 09/12/2021 CLINICAL DATA:  [id number].  Respiratory failure with hypoxia. EXAM: PORTABLE CHEST 1 VIEW COMPARISON:  Portable chest yesterday at 4:49 a.m. FINDINGS: 5:02 a.m. Interstitial and faint patchy hazy opacities in the peripheral mid to lower lungs continue to be noted. The lungs are otherwise generally clear with emphysematous changes. Cardiomegaly. No vascular congestion is seen. 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 No pleural effusion is seen. Thoracic dextroscoliosis. No appreciable new abnormality. IMPRESSION: Peripheral hazy  opacities are unchanged with underlying interstitial thickening. Overall aeration is stable. No new  abnormality. Electronically Signed   By: Telford Nab M.D.   On: 09/12/2021 07:32   ECHOCARDIOGRAM COMPLETE  Result Date: 09/11/2021    ECHOCARDIOGRAM REPORT   Patient Name:   Jason Abbott. Date of Exam: 09/11/2021 Medical Rec #:  [id number]           Height:       71.0 in Accession #:    [id number]          Weight:       178.6 lb Date of Birth:  03-Jun-1945           BSA:          2.009 m Patient Age:    75 years            BP:           124/86 mmHg Patient Gender: M                   HR:           76 bpm. Exam Location:  Inpatient Procedure: 2D Echo Indications:    Aortic Arch Aneursym  History:        Patient has prior history of Echocardiogram examinations, most                 recent 09/03/2021. Stroke, Signs/Symptoms:Shortness of Breath;                 Risk Factors:Dyslipidemia.  Sonographer:    Harvie Junior Referring Phys: [place]  1. Left ventricular ejection fraction, by estimation, is 55 to 60%. The left ventricle has normal function. The left ventricle has no regional wall motion abnormalities. Left ventricular diastolic parameters are indeterminate.  2. Right ventricular systolic function is normal. The right ventricular size is mildly enlarged. There is normal pulmonary artery systolic pressure.  3. Right atrial size was severely dilated.  4. The mitral valve is normal in structure. No evidence of mitral valve regurgitation. No evidence of mitral stenosis.  5. The aortic valve is tricuspid. Aortic valve regurgitation is not visualized. Aortic valve sclerosis is present, with no evidence of aortic valve stenosis.  6. There is moderate dilatation of the aortic root, measuring 43 mm.  7. The inferior vena cava is normal in size with greater than 50% respiratory variability, suggesting right atrial pressure of 3 mmHg. FINDINGS  Left Ventricle: Left ventricular ejection fraction,  by estimation, is 55 to 60%. The left ventricle has normal function. The left ventricle has no regional wall motion abnormalities. The left ventricular internal cavity size was normal in size. There is  no left ventricular hypertrophy. Left ventricular diastolic parameters are indeterminate. Right Ventricle: The right ventricular size is mildly enlarged. No increase in right ventricular wall thickness. Right ventricular systolic function is normal. There is normal pulmonary artery systolic pressure. The tricuspid regurgitant velocity is 2.28  m/s, and with an assumed right atrial pressure of 3 mmHg, the estimated right ventricular systolic pressure is 23.5 mmHg. Left Atrium: Left atrial size was normal in size. Right Atrium: Right atrial size was severely dilated. Pericardium: There is no evidence of pericardial effusion. Presence of epicardial fat layer. Mitral Valve: The mitral valve is normal in structure. No evidence of mitral valve regurgitation. No evidence of mitral valve stenosis. Tricuspid Valve: The tricuspid valve is normal in structure. Tricuspid valve regurgitation is not demonstrated. No evidence of tricuspid stenosis. Aortic Valve: The aortic valve is tricuspid. Aortic valve regurgitation  is not visualized. Aortic valve sclerosis is present, with no evidence of aortic valve stenosis. Aortic valve mean gradient measures 3.5 mmHg. Aortic valve peak gradient measures 5.6  mmHg. Aortic valve area, by VTI measures 2.92 cm. Pulmonic Valve: The pulmonic valve was normal in structure. Pulmonic valve regurgitation is not visualized. No evidence of pulmonic stenosis. Aorta: There is moderate dilatation of the aortic root, measuring 43 mm. Venous: The inferior vena cava is normal in size with greater than 50% respiratory variability, suggesting right atrial pressure of 3 mmHg. IAS/Shunts: No atrial level shunt detected by color flow Doppler.  LEFT VENTRICLE PLAX 2D LVIDd:         5.20 cm      Diastology LVIDs:          3.60 cm      LV e' medial:    7.18 cm/s LV PW:         1.00 cm      LV E/e' medial:  6.6 LV IVS:        0.90 cm      LV e' lateral:   10.20 cm/s LVOT diam:     2.30 cm      LV E/e' lateral: 4.6 LV SV:         61 LV SV Index:   30 LVOT Area:     4.15 cm  LV Volumes (MOD) LV vol d, MOD A2C: 85.4 ml LV vol d, MOD A4C: 106.0 ml LV vol s, MOD A2C: 36.8 ml LV vol s, MOD A4C: 46.9 ml LV SV MOD A2C:     48.6 ml LV SV MOD A4C:     106.0 ml LV SV MOD BP:      60.6 ml RIGHT VENTRICLE RV Basal diam:  4.40 cm RV Mid diam:    4.00 cm RV S prime:     28.80 cm/s TAPSE (M-mode): 3.0 cm LEFT ATRIUM             Index        RIGHT ATRIUM           Index LA diam:        3.70 cm 1.84 cm/m   RA Area:     31.80 cm LA Vol (A2C):   48.2 ml 23.99 ml/m  RA Volume:   118.00 ml 58.72 ml/m LA Vol (A4C):   53.6 ml 26.67 ml/m LA Biplane Vol: 52.6 ml 26.18 ml/m  AORTIC VALVE                    PULMONIC VALVE AV Area (Vmax):    2.67 cm     PV Vmax:          1.14 m/s AV Area (Vmean):   2.58 cm     PV Peak grad:     5.2 mmHg AV Area (VTI):     2.92 cm     PR End Diast Vel: 2.84 msec AV Vmax:           118.50 cm/s AV Vmean:          83.800 cm/s AV VTI:            0.208 m AV Peak Grad:      5.6 mmHg AV Mean Grad:      3.5 mmHg LVOT Vmax:         76.20 cm/s LVOT Vmean:        52.100 cm/s LVOT VTI:  0.146 m LVOT/AV VTI ratio: 0.70  AORTA Ao Root diam: 4.30 cm Ao Asc diam:  3.30 cm MITRAL VALVE               TRICUSPID VALVE MV Area (PHT): 6.07 cm    TR Peak grad:   20.8 mmHg MV Decel Time: 125 msec    TR Vmax:        228.00 cm/s MR Peak grad: 11.6 mmHg MR Vmax:      170.00 cm/s  SHUNTS MV E velocity: 47.10 cm/s  Systemic VTI:  0.15 m MV A velocity: 70.80 cm/s  Systemic Diam: 2.30 cm MV E/A ratio:  0.67 Kardie Tobb DO Electronically signed by Berniece Salines DO Signature Date/Time: 09/11/2021/4:02:44 PM    Final    DG CHEST PORT 1 VIEW  Result Date: 09/11/2021 CLINICAL DATA:  [id number].  Shortness of breath. EXAM: PORTABLE CHEST 1 VIEW  COMPARISON:  Portable chest and CTA chest both 09/09/21 FINDINGS: 4:49 a.m.  There is mild cardiomegaly without findings of CHF. 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 There are patchy peripheral hazy opacities of the mid to lower lung fields concerning for viral or atypical multilobar pneumonia. The The remaining lungs clear with COPD change. The sulci are sharp. Mild thoracic dextroscoliosis and osteopenia. IMPRESSION: Increased patchy peripheral haziness in the mid to lower lung fields concerning for multilobar pneumonia including viral or atypical etiologies. Electronically Signed   By: Telford Nab M.D.   On: 09/11/2021 07:32   CT Angio Chest PE W and/or Wo Contrast  Result Date: 09/09/2021 CLINICAL DATA:  Positive D-dimer.  Concern for pulmonary embolism. EXAM: CT ANGIOGRAPHY CHEST WITH CONTRAST TECHNIQUE: Multidetector CT imaging of the chest was performed using the standard protocol during bolus administration of intravenous contrast. Multiplanar CT image reconstructions and MIPs were obtained to evaluate the vascular anatomy. RADIATION DOSE REDUCTION: This exam was performed according to the departmental dose-optimization program which includes automated exposure control, adjustment of the mA and/or kV according to patient size and/or use of iterative reconstruction technique. CONTRAST:  41m OMNIPAQUE IOHEXOL 350 MG/ML SOLN COMPARISON:  Chest CT dated 08/19/2021. Chest radiograph dated 09/10/2018. FINDINGS: Evaluation of this exam is limited due to respiratory motion artifact. Cardiovascular: Mild cardiomegaly. No pericardial effusion. There is coronary vascular calcification. Moderate atherosclerotic calcification of the thoracic aorta. 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 Follow-up as per  recommendation of the prior CT with cardiothoracic surgery consult. There is a left-sided aortic arch with aberrant right subclavian artery anatomy. No periaortic fluid collection. No extravasation of contrast. Evaluation of the pulmonary arteries is limited due to respiratory motion. No central acute pulmonary artery embolus identified. Mediastinum/Nodes: Top-normal left hilar lymph node measures 1 cm short axis. No mediastinal adenopathy. The esophagus is grossly unremarkable. No mediastinal fluid collection. Lungs/Pleura: Background of emphysema. Diffuse interstitial thickening and haziness primarily involving the left lung common new since the prior CT and may represent edema or infiltrate. No consolidative changes. There is no pleural effusion pneumothorax. The central airways are patent. Upper Abdomen: Multiple hepatic hypodense lesions, suboptimally characterized, likely cysts. Musculoskeletal: No acute osseous pathology.  Degenerative changes. Review of the MIP images confirms the above findings. IMPRESSION: 1. No CT evidence of central acute pulmonary artery embolus. 2. Left-sided aortic arch with aberrant right subclavian artery anatomy. No periaortic fluid collection.  3. 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 Follow-up as per recommendation of the prior CT with cardiothoracic surgery consult. 4. Aortic Atherosclerosis (ICD10-I70.0) and Emphysema (ICD10-J43.9). Electronically Signed   By: Anner Crete M.D.   On: 09/09/2021 23:58   DG Chest Portable 1 View  Result Date: 09/09/2021 CLINICAL DATA:  Shortness of breath. EXAM: PORTABLE CHEST 1 VIEW COMPARISON:  Radiograph 08/24/2021.  CT 08/19/2021 FINDINGS: Patient is rotated. Stable heart size. 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 Comparison with prior exam is difficult due to degree of rotation. Calcified anterior mediastinal lymph nodes. Emphysema,  ill-defined opacity in the left hemithorax, not definitively seen on prior. Resolved right infrahilar opacity from prior. Blunting of the costophrenic angles may be due to effusions or hyperinflation. No pneumothorax. Lower thoracic scoliosis. IMPRESSION: 1. Ill-defined opacity in the left hemithorax, may be infectious or asymmetric edema. 2. 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 Comparison with prior exam is difficult due to degree of rotation. 3. Resolved right infrahilar opacity. 4. Blunting of the costophrenic angles may be due to effusions or hyperinflation. Electronically Signed   By: Keith Rake M.D.   On: 09/09/2021 20:44   DG Chest 2 View  Result Date: 08/24/2021 CLINICAL DATA:  Shortness of breath with cough and wheezing. EXAM: CHEST - 2 VIEW COMPARISON:  Chest radiograph 08/12/2021, CTA chest 08/19/2021 FINDINGS: A focal bulge along the AP window is consistent with the known saccular aortic aneurysm better seen on prior CTA. The heart size is stable. Calcified mediastinal lymph nodes are unchanged. There is patchy opacity in the right middle lobe which is increased from the prior study suspicious for pneumonia. There is no other focal airspace disease. There is no pulmonary edema. There is no pleural effusion or pneumothorax There is no acute osseous abnormality. IMPRESSION: 1. Suspect right middle lobe pneumonia. Recommend follow-up radiographs in 3-4 weeks to assess for resolution. 2. Stable abnormal mediastinal contour consistent with the known aortic aneurysm better seen on recent CTA chest. Electronically Signed   By: Valetta Mole M.D.   On: 08/24/2021 12:46   CT ANGIO CHEST AORTA W/CM & OR WO/CM  Result Date: 08/19/2021 CLINICAL DATA:  Thoracic aortic aneurysm. EXAM: CT ANGIOGRAPHY CHEST WITH CONTRAST TECHNIQUE: Multidetector CT imaging of the chest was performed using the standard protocol during bolus administration of intravenous  contrast. Multiplanar CT image reconstructions and MIPs were obtained to evaluate the vascular anatomy. RADIATION DOSE REDUCTION: This exam was performed according to the departmental dose-optimization program which includes automated exposure control, adjustment of the mA and/or kV according to patient size and/or use of iterative reconstruction technique. CONTRAST:  199m OMNIPAQUE IOHEXOL 350 MG/ML SOLN COMPARISON:  July 20, 2019. FINDINGS: Cardiovascular: Atherosclerosis of thoracic aorta is noted without dissection. There is been significant enlargement of saccular aneurysm arising from the inferior aspect of the transverse aortic arch measuring 6.0 by 4.6 cm. Normal cardiac size. No pericardial effusion. Coronary artery calcifications are noted. 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 Mediastinum/Nodes: No enlarged mediastinal, hilar, or axillary lymph nodes. Thyroid gland, trachea, and esophagus demonstrate no significant findings. Lungs/Pleura: No pneumothorax or pleural effusion is noted. Minimal biapical scarring is noted. Mild bibasilar subsegmental atelectasis is noted. Upper Abdomen: Stable hepatic cysts. Musculoskeletal: No chest wall abnormality. No acute or significant osseous findings. Review of the MIP images confirms the above findings. IMPRESSION: 6.0 x 4.6  cm saccular aneurysm is seen arising from inferior aspect of the transverse aortic arch which is significantly enlarged compared to prior exam. Cardiothoracic surgery consultation recommended due to increased risk of rupture for arch aneurysm ? 5.5 cm. This recommendation follows 2010 ACCF/AHA/AATS/ACR/ASA/SCA/SCAI/SIR/STS/SVM Guidelines for the Diagnosis and Management of Patients With Thoracic Aortic Disease. Circulation. 2010; 121: B762-G315. Aortic aneurysm NOS (ICD10-I71.9). These results will be called to the ordering clinician or representative by the Radiologist Assistant, and communication documented in  the PACS or zVision Dashboard. Coronary artery calcifications are noted. 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 Minimal biapical scarring is noted. Mild bibasilar subsegmental atelectasis is noted. Aortic Atherosclerosis (ICD10-I70.0). Electronically Signed   By: Marijo Conception M.D.   On: 08/19/2021 12:10    Microbiology: Results for orders placed or performed during the hospital encounter of 09/09/21  SARS Coronavirus 2 by RT PCR (hospital order, performed in [facility] hospital lab) *cepheid single result test*     Status: None   Collection Time: 09/09/21  8:08 PM   Specimen: Nasal Swab  Result Value Ref Range Status   SARS Coronavirus 2 by RT PCR NEGATIVE NEGATIVE Final    Comment: (NOTE) SARS-CoV-2 target nucleic acids are NOT DETECTED.  The SARS-CoV-2 RNA is generally detectable in upper and lower respiratory specimens during the acute phase of infection. The lowest concentration of SARS-CoV-2 viral copies this assay can detect is 250 copies / mL. A negative result does not preclude SARS-CoV-2 infection and should not be used as the sole basis for treatment or other patient management decisions.  A negative result may occur with improper specimen collection / handling, submission of specimen other than nasopharyngeal swab, presence of viral mutation(s) within the areas targeted by this assay, and inadequate number of viral copies (<250 copies / mL). A negative result must be combined with clinical observations, patient history, and epidemiological information.  Fact Sheet for Patients:   https://www.patel.info/  Fact Sheet for Healthcare Providers: https://hall.com/  This test is not yet approved or  cleared by the [place] FDA and has been authorized for detection and/or diagnosis of SARS-CoV-2 by FDA under an Emergency Use Authorization (EUA).  This EUA will remain in effect (meaning this test can  be used) for the duration of the COVID-19 declaration under Section 564(b)(1) of the Act, 21 U.S.C. section 360bbb-3(b)(1), unless the authorization is terminated or revoked sooner.  Performed at [facility], [place] [address] [place], [place] [address]   Culture, blood (routine x 2)     Status: None (Preliminary result)   Collection Time: 09/09/21  9:04 PM   Specimen: BLOOD LEFT FOREARM  Result Value Ref Range Status   Specimen Description BLOOD LEFT FOREARM  Final   Special Requests   Final    BOTTLES DRAWN AEROBIC AND ANAEROBIC Blood Culture adequate volume   Culture   Final    NO GROWTH 4 DAYS Performed at [facility], [place] [address], [place], [place] [address]    Report Status PENDING  Incomplete  Culture, blood (routine x 2)     Status: None (Preliminary result)   Collection Time: 09/10/21  4:17 AM   Specimen: BLOOD LEFT FOREARM  Result Value Ref Range Status   Specimen Description BLOOD LEFT FOREARM  Final   Special Requests   Final    BOTTLES DRAWN AEROBIC AND ANAEROBIC Blood Culture adequate volume   Culture   Final    NO GROWTH 3  DAYS Performed at [facility], [place] [address], [place], [place] [address]    Report Status PENDING  Incomplete  Respiratory (~20 pathogens) panel by PCR     Status: None   Collection Time: 09/10/21 10:48 AM   Specimen: Nasopharyngeal Swab; Respiratory  Result Value Ref Range Status   Adenovirus NOT DETECTED NOT DETECTED Final   Coronavirus 229E NOT DETECTED NOT DETECTED Final    Comment: (NOTE) The Coronavirus on the Respiratory Panel, DOES NOT test for the novel  Coronavirus (2019 nCoV)    Coronavirus HKU1 NOT DETECTED NOT DETECTED Final   Coronavirus NL63 NOT DETECTED NOT DETECTED Final   Coronavirus OC43 NOT DETECTED NOT DETECTED Final   Metapneumovirus NOT DETECTED NOT DETECTED Final   Rhinovirus / Enterovirus NOT DETECTED NOT DETECTED Final   Influenza A NOT DETECTED NOT DETECTED Final   Influenza B NOT  DETECTED NOT DETECTED Final   Parainfluenza Virus 1 NOT DETECTED NOT DETECTED Final   Parainfluenza Virus 2 NOT DETECTED NOT DETECTED Final   Parainfluenza Virus 3 NOT DETECTED NOT DETECTED Final   Parainfluenza Virus 4 NOT DETECTED NOT DETECTED Final   Respiratory Syncytial Virus NOT DETECTED NOT DETECTED Final   Bordetella pertussis NOT DETECTED NOT DETECTED Final   Bordetella Parapertussis NOT DETECTED NOT DETECTED Final   Chlamydophila pneumoniae NOT DETECTED NOT DETECTED Final   Mycoplasma pneumoniae NOT DETECTED NOT DETECTED Final    Comment: Performed at [facility], [place]. [address] [place], [place] [address]   Labs: CBC: Recent Labs  Lab 09/09/21 2104 09/10/21 0417 09/11/21 0541 09/12/21 0456 09/13/21 0712  WBC 17.1* 21.9* 11.2* 22.3* 25.4*  NEUTROABS 7.4 10.3* 4.8 8.9* 7.6  HGB 14.0 14.2 13.8 13.5 13.4  HCT 42.8 43.0 40.3 39.7 40.1  MCV 94.9 95.1 92.0 92.8 93.9  PLT 90* 77* 90* 107* 353*   Basic Metabolic Panel: Recent Labs  Lab 09/09/21 2104 09/10/21 0417 09/11/21 0541 09/12/21 0456 09/13/21 0712  NA 133* 136 136 135 137  K 3.9 4.2 4.4 4.4 4.5  CL 100 102 104 98 102  CO2 '23 25 25 26 26  '$ GLUCOSE 105* 104* 148* 109* 93  BUN '12 10 15 19 22  '$ CREATININE 1.06 1.03 0.94 0.89 0.91  CALCIUM 8.6* 8.9 9.2 8.8* 9.6  MG  --  1.9 2.0 2.2 2.1  PHOS  --   --  4.4 4.1 4.0   Liver Function Tests: Recent Labs  Lab 09/09/21 2104 09/10/21 0417 09/11/21 0541 09/12/21 0456 09/13/21 0712  AST '21 22 18 17 16  '$ ALT '19 18 17 18 19  '$ ALKPHOS 62 53 58 55 52  BILITOT 1.0 0.6 0.7 0.5 0.6  PROT 5.5* 5.9* 5.3* 5.4* 5.3*  ALBUMIN 3.3* 3.2* 2.8* 3.1* 3.0*   CBG: No results for input(s): "GLUCAP" in the last 168 hours.  Discharge time spent: greater than 30 minutes.  Signed: Raiford Noble, DO [facility] 09/13/2021

## 2021-09-13 NOTE — Progress Notes (Addendum)
Patient's oxygen saturation dropped to 84% while ambulating with no oxygen.  SATURATION QUALIFICATIONS: (This note is used to comply with regulatory documentation for home oxygen)   Patient Saturations on Room Air at Rest = 92%   Patient Saturations on Room Air while Ambulating = 84%   Patient Saturations on 2 Liters of oxygen while Ambulating = 94%

## 2021-09-13 NOTE — Progress Notes (Signed)
    Durable Medical Equipment  (From admission, onward)           Start     Ordered   09/13/21 1125  For home use only DME oxygen  Once       Question Answer Comment  Length of Need 6 Months   Mode or (Route) Nasal cannula   Liters per Minute 2   Frequency Continuous (stationary and portable oxygen unit needed)   Oxygen delivery system Gas      09/13/21 1124

## 2021-09-14 LAB — CULTURE, BLOOD (ROUTINE X 2)
Culture: NO GROWTH
Special Requests: ADEQUATE

## 2021-09-15 LAB — CULTURE, BLOOD (ROUTINE X 2)
Culture: NO GROWTH
Special Requests: ADEQUATE

## 2021-09-16 ENCOUNTER — Telehealth: Payer: Self-pay | Admitting: *Deleted

## 2021-09-16 ENCOUNTER — Encounter: Payer: Self-pay | Admitting: *Deleted

## 2021-09-16 NOTE — Patient Outreach (Signed)
  Care Coordination [facility] Note Transition Care Management Follow-up Telephone Call Date of discharge and from where: [facility] 09/13/21 How have you been since you were released from the hospital? Doing much better Any questions or concerns? Yes. Home health hasn't reached out. Provided with [facility] contact information.   Items Reviewed: Did the pt receive and understand the discharge instructions provided? Yes  Medications obtained and verified? Yes  Other? No  Any new allergies since your discharge? No  Dietary orders reviewed? Yes Do you have support at home? Yes   Home Care and Equipment/Supplies: Were home health services ordered? yes If so, what is the name of the agency? [facility]  Has the agency set up a time to come to the patient's home? no Were any new equipment or medical supplies ordered?  Yes: Oxygen What is the name of the medical supply agency? unsure Were you able to get the supplies/equipment? yes Do you have any questions related to the use of the equipment or supplies? No  Functional Questionnaire: (I = Independent and D = Dependent) ADLs: I  Bathing/Dressing- I  Meal Prep- I  Eating- I  Maintaining continence- I  Transferring/Ambulation- I  Managing Meds- I  Follow up appointments reviewed:  PCP Hospital f/u appt confirmed? No .Patient will call tomorrow to schedule.  [facility] f/u appt confirmed? Yes  Scheduled to see June Leap, MD on 09/23/21 @ 3:00. Are transportation arrangements needed? No  If their condition worsens, is the pt aware to call PCP or go to the Emergency Dept.? Yes Was the patient provided with contact information for the PCP's office or ED? Yes Was to pt encouraged to call back with questions or concerns? Yes  SDOH assessments and interventions completed:   Yes  Care Coordination Interventions Activated:  Yes   Care Coordination Interventions:   Provided with [facility] contact information for patient to reach out  regarding home health assessment     Encounter Outcome:  Pt. Visit Completed    Chong Sicilian, BSN, RN-BC [place] [facility], community Dial: [phone]

## 2021-09-17 LAB — PATHOLOGIST SMEAR REVIEW

## 2021-09-18 ENCOUNTER — Telehealth (HOSPITAL_COMMUNITY): Payer: Self-pay | Admitting: Emergency Medicine

## 2021-09-18 DIAGNOSIS — R079 Chest pain, unspecified: Secondary | ICD-10-CM

## 2021-09-18 MED ORDER — METOPROLOL TARTRATE 100 MG PO TABS: 100.0000 mg | ORAL_TABLET | Freq: Once | ORAL | 0 refills | Status: DC

## 2021-09-18 NOTE — Telephone Encounter (Signed)
Reaching out to patient to offer assistance regarding upcoming cardiac imaging study; pt verbalizes understanding of appt date/time, parking situation and where to check in, pre-test NPO status and medications ordered, and verified current allergies; name and call back number provided for further questions should they arise Jason Bond RN Navigator Cardiac Imaging [facility] [phone] office [phone] cell  Arrival 1200 w/c entrance '100mg'$  metoprolol tartrate Denies iv issues Aware of ntiro  This study is to evaluate the coronary arteries for upcoming surgery. Regular prospective scan with nitro, regular parameters/windows. No need to open FOV for aorta, etc.

## 2021-09-20 ENCOUNTER — Ambulatory Visit (HOSPITAL_COMMUNITY)
Admission: RE | Admit: 2021-09-20 | Discharge: 2021-09-20 | Disposition: A | Discharge status: Home / Self Care | Payer: Medicare Other | Source: Ambulatory Visit | Attending: Cardiothoracic Surgery | Admitting: Cardiothoracic Surgery

## 2021-09-20 ENCOUNTER — Other Ambulatory Visit: Payer: Self-pay

## 2021-09-20 DIAGNOSIS — I253 Aneurysm of heart: Secondary | ICD-10-CM | POA: Insufficient documentation

## 2021-09-20 DIAGNOSIS — J189 Pneumonia, unspecified organism: Secondary | ICD-10-CM | POA: Insufficient documentation

## 2021-09-20 DIAGNOSIS — I2510 Atherosclerotic heart disease of native coronary artery without angina pectoris: Secondary | ICD-10-CM | POA: Diagnosis not present

## 2021-09-20 DIAGNOSIS — Z01818 Encounter for other preprocedural examination: Secondary | ICD-10-CM

## 2021-09-20 DIAGNOSIS — I7122 Aneurysm of the aortic arch, without rupture: Secondary | ICD-10-CM

## 2021-09-20 MED ORDER — NITROGLYCERIN 0.4 MG SL SUBL: 0.8000 mg | SUBLINGUAL_TABLET | Freq: Once | SUBLINGUAL | Status: AC

## 2021-09-20 MED ORDER — NITROGLYCERIN 0.4 MG SL SUBL
SUBLINGUAL_TABLET | SUBLINGUAL | Status: AC
  Administered 2021-09-20: 0.8 mg via SUBLINGUAL
  Filled 2021-09-20: qty 2

## 2021-09-20 MED ORDER — IOHEXOL 350 MG/ML SOLN
100.0000 mL | Freq: Once | INTRAVENOUS | Status: AC | PRN
  Administered 2021-09-20: 100 mL via INTRAVENOUS

## 2021-09-22 DIAGNOSIS — Z8673 Personal history of transient ischemic attack (TIA), and cerebral infarction without residual deficits: Secondary | ICD-10-CM | POA: Diagnosis not present

## 2021-09-22 DIAGNOSIS — Z79899 Other long term (current) drug therapy: Secondary | ICD-10-CM | POA: Diagnosis not present

## 2021-09-22 DIAGNOSIS — J449 Chronic obstructive pulmonary disease, unspecified: Secondary | ICD-10-CM | POA: Diagnosis not present

## 2021-09-22 DIAGNOSIS — I491 Atrial premature depolarization: Secondary | ICD-10-CM | POA: Diagnosis not present

## 2021-09-22 DIAGNOSIS — R4182 Altered mental status, unspecified: Secondary | ICD-10-CM | POA: Diagnosis not present

## 2021-09-22 DIAGNOSIS — R410 Disorientation, unspecified: Secondary | ICD-10-CM | POA: Diagnosis not present

## 2021-09-22 DIAGNOSIS — I4510 Unspecified right bundle-branch block: Secondary | ICD-10-CM | POA: Diagnosis not present

## 2021-09-22 DIAGNOSIS — I7122 Aneurysm of the aortic arch, without rupture: Secondary | ICD-10-CM | POA: Diagnosis not present

## 2021-09-22 DIAGNOSIS — J189 Pneumonia, unspecified organism: Secondary | ICD-10-CM

## 2021-09-22 DIAGNOSIS — Z5329 Procedure and treatment not carried out because of patient's decision for other reasons: Secondary | ICD-10-CM | POA: Diagnosis not present

## 2021-09-22 DIAGNOSIS — C9110 Chronic lymphocytic leukemia of B-cell type not having achieved remission: Secondary | ICD-10-CM | POA: Diagnosis not present

## 2021-09-22 DIAGNOSIS — N3000 Acute cystitis without hematuria: Secondary | ICD-10-CM | POA: Diagnosis not present

## 2021-09-22 DIAGNOSIS — R262 Difficulty in walking, not elsewhere classified: Secondary | ICD-10-CM | POA: Diagnosis not present

## 2021-09-22 HISTORY — DX: Pneumonia, unspecified organism: J18.9

## 2021-09-23 ENCOUNTER — Inpatient Hospital Stay: Payer: Medicare Other | Admitting: Pulmonary Disease

## 2021-09-23 DIAGNOSIS — N3000 Acute cystitis without hematuria: Secondary | ICD-10-CM | POA: Diagnosis not present

## 2021-09-23 DIAGNOSIS — Z8673 Personal history of transient ischemic attack (TIA), and cerebral infarction without residual deficits: Secondary | ICD-10-CM | POA: Diagnosis not present

## 2021-09-23 DIAGNOSIS — C9110 Chronic lymphocytic leukemia of B-cell type not having achieved remission: Secondary | ICD-10-CM | POA: Diagnosis not present

## 2021-09-23 DIAGNOSIS — J449 Chronic obstructive pulmonary disease, unspecified: Secondary | ICD-10-CM | POA: Diagnosis not present

## 2021-09-23 DIAGNOSIS — I491 Atrial premature depolarization: Secondary | ICD-10-CM | POA: Diagnosis not present

## 2021-09-23 DIAGNOSIS — I4510 Unspecified right bundle-branch block: Secondary | ICD-10-CM | POA: Diagnosis not present

## 2021-09-23 DIAGNOSIS — R410 Disorientation, unspecified: Secondary | ICD-10-CM | POA: Diagnosis not present

## 2021-09-23 DIAGNOSIS — Z8679 Personal history of other diseases of the circulatory system: Secondary | ICD-10-CM | POA: Diagnosis not present

## 2021-09-25 ENCOUNTER — Encounter: Payer: Self-pay | Admitting: Sports Medicine

## 2021-09-25 ENCOUNTER — Ambulatory Visit (INDEPENDENT_AMBULATORY_CARE_PROVIDER_SITE_OTHER): Payer: Medicare Other | Admitting: Sports Medicine

## 2021-09-25 DIAGNOSIS — Z8673 Personal history of transient ischemic attack (TIA), and cerebral infarction without residual deficits: Secondary | ICD-10-CM | POA: Diagnosis not present

## 2021-09-25 DIAGNOSIS — N400 Enlarged prostate without lower urinary tract symptoms: Secondary | ICD-10-CM | POA: Diagnosis not present

## 2021-09-25 DIAGNOSIS — J189 Pneumonia, unspecified organism: Secondary | ICD-10-CM

## 2021-09-25 NOTE — Progress Notes (Signed)
    Procedures performed today:    None.  Independent interpretation of notes and tests performed by another provider:   None.  Brief History, Exam, Impression, and Recommendations:    Community acquired bilateral lower lobe pneumonia Jason Abbott returns, he was initially hospitalized for community-acquired pneumonia with hypoxic respiratory failure as an inpatient after outpatient failure of antibiotics, treated successfully and discharged from the hospital.  He seems to be doing well now, he denies any shortness of breath, cough, runny nose, no chest pain. Lungs are clear. He has had a single pneumococcal vaccine back in 2014, declines any other vaccinations for flu, pneumonia, or RSV.  BPH (benign prostatic hyperplasia) Jason Abbott also had an episode of cystitis and was rehospitalized after was found with altered mental status, labile mood, odd activities. He was found to have nitrites and leukocytes in his urine, he was treated with antibiotics, and left the hospital AMA. He is feeling a lot better now, he is aware of the symptoms to look out for, he will finish his course of antibiotics and return to see [place] as needed.  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 At his recent hospitalization he was noted to have somewhat of a labile mood with likely delirium. Due to his labile mood I think we should obtain a new brain MRI.    ____________________________________________ Gwen Her. Dianah Field, M.D., ABFM., CAQSM., AME. Primary Care and Sports Medicine [facility]  Adjunct Professor of [place] [employer]  Risk manager

## 2021-09-25 NOTE — Assessment & Plan Note (Addendum)
Jason Abbott returns, he was initially hospitalized for community-acquired pneumonia with hypoxic respiratory failure as an inpatient after outpatient failure of antibiotics, treated successfully and discharged from the hospital.  He seems to be doing well now, he denies any shortness of breath, cough, runny nose, no chest pain. Lungs are clear. He has had a single pneumococcal vaccine back in 2014, declines any other vaccinations for flu, pneumonia, or RSV.

## 2021-09-25 NOTE — Assessment & Plan Note (Signed)
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 At his recent hospitalization he was noted to have somewhat of a labile mood with likely delirium. Due to his labile mood I think we should obtain a new brain MRI.

## 2021-09-25 NOTE — Assessment & Plan Note (Signed)
Jason Abbott also had an episode of cystitis and was rehospitalized after was found with altered mental status, labile mood, odd activities. He was found to have nitrites and leukocytes in his urine, he was treated with antibiotics, and left the hospital AMA. He is feeling a lot better now, he is aware of the symptoms to look out for, he will finish his course of antibiotics and return to see [place] as needed.

## 2021-09-28 ENCOUNTER — Ambulatory Visit (HOSPITAL_BASED_OUTPATIENT_CLINIC_OR_DEPARTMENT_OTHER)
Admission: RE | Admit: 2021-09-28 | Discharge: 2021-09-28 | Disposition: A | Discharge status: Home / Self Care | Payer: Medicare Other | Source: Ambulatory Visit | Attending: Sports Medicine | Admitting: Sports Medicine

## 2021-09-28 DIAGNOSIS — Z8673 Personal history of transient ischemic attack (TIA), and cerebral infarction without residual deficits: Secondary | ICD-10-CM | POA: Diagnosis not present

## 2021-09-28 DIAGNOSIS — R5383 Other fatigue: Secondary | ICD-10-CM | POA: Diagnosis not present

## 2021-09-28 DIAGNOSIS — R4182 Altered mental status, unspecified: Secondary | ICD-10-CM | POA: Diagnosis not present

## 2021-09-28 MED ORDER — GADOBUTROL 1 MMOL/ML IV SOLN
7.5000 mL | Freq: Once | INTRAVENOUS | Status: AC | PRN
Start: 2021-09-28 — End: 2021-09-28
  Administered 2021-09-28: 7.5 mL via INTRAVENOUS

## 2021-09-30 ENCOUNTER — Encounter: Payer: Medicare Other | Admitting: Cardiothoracic Surgery

## 2021-10-07 ENCOUNTER — Other Ambulatory Visit: Payer: Medicare Other

## 2021-10-07 ENCOUNTER — Ambulatory Visit (INDEPENDENT_AMBULATORY_CARE_PROVIDER_SITE_OTHER): Payer: Medicare Other | Admitting: Pulmonary Disease

## 2021-10-07 DIAGNOSIS — J189 Pneumonia, unspecified organism: Secondary | ICD-10-CM | POA: Diagnosis not present

## 2021-10-07 DIAGNOSIS — M25531 Pain in right wrist: Secondary | ICD-10-CM | POA: Diagnosis not present

## 2021-10-07 LAB — PULMONARY FUNCTION TEST
DL/VA % pred: 56 %
DL/VA: 2.2 ml/min/mmHg/L
DLCO cor % pred: 56 %
DLCO cor: 14.72 ml/min/mmHg
DLCO unc % pred: 55 %
DLCO unc: 14.33 ml/min/mmHg
FEF 25-75 Post: 1.4 L/sec
FEF 25-75 Pre: 1.88 L/sec
FEF2575-%Change-Post: -25 %
FEF2575-%Pred-Post: 61 %
FEF2575-%Pred-Pre: 82 %
FEV1-%Change-Post: -8 %
FEV1-%Pred-Post: 94 %
FEV1-%Pred-Pre: 103 %
FEV1-Post: 3.02 L
FEV1-Pre: 3.28 L
FEV1FVC-%Change-Post: -4 %
FEV1FVC-%Pred-Pre: 96 %
FEV6-%Change-Post: -4 %
FEV6-%Pred-Post: 106 %
FEV6-%Pred-Pre: 111 %
FEV6-Post: 4.4 L
FEV6-Pre: 4.61 L
FEV6FVC-%Change-Post: 0 %
FEV6FVC-%Pred-Post: 103 %
FEV6FVC-%Pred-Pre: 103 %
FVC-%Change-Post: -3 %
FVC-%Pred-Post: 103 %
FVC-%Pred-Pre: 107 %
FVC-Post: 4.54 L
FVC-Pre: 4.73 L
Post FEV1/FVC ratio: 66 %
Post FEV6/FVC ratio: 97 %
Pre FEV1/FVC ratio: 69 %
Pre FEV6/FVC Ratio: 97 %
RV % pred: 99 %
RV: 2.65 L
TLC % pred: 103 %
TLC: 7.61 L

## 2021-10-07 NOTE — Progress Notes (Signed)
Full PFT Performed Today  

## 2021-10-07 NOTE — Patient Instructions (Signed)
Full PFT Performed Today  

## 2021-10-08 ENCOUNTER — Encounter (HOSPITAL_COMMUNITY): Payer: Medicare Other

## 2021-10-09 ENCOUNTER — Encounter: Payer: Self-pay | Admitting: Pulmonary Disease

## 2021-10-09 ENCOUNTER — Ambulatory Visit (INDEPENDENT_AMBULATORY_CARE_PROVIDER_SITE_OTHER): Payer: Medicare Other | Admitting: Pulmonary Disease

## 2021-10-09 VITALS — BP 120/80 | HR 59 | Ht 70.5 in | Wt 180.8 lb

## 2021-10-09 DIAGNOSIS — I7122 Aneurysm of the aortic arch, without rupture: Secondary | ICD-10-CM | POA: Diagnosis not present

## 2021-10-09 DIAGNOSIS — J449 Chronic obstructive pulmonary disease, unspecified: Secondary | ICD-10-CM | POA: Diagnosis not present

## 2021-10-09 DIAGNOSIS — J189 Pneumonia, unspecified organism: Secondary | ICD-10-CM | POA: Diagnosis not present

## 2021-10-09 NOTE — Patient Instructions (Signed)
Thank you for visiting Dr. Valeta Harms at [facility]. Today we recommend the following:  Continue breztri inhaler  Continue albuterol as needed   Return in about 6 months (around 04/10/2022) for with APP or Dr. Valeta Harms.    Please do your part to reduce the spread of COVID-19.

## 2021-10-09 NOTE — Progress Notes (Signed)
Synopsis: Referred in October 2023 for hospital follow-up by Silverio Decamp,*  Subjective:   PATIENT ID: Jason Abbott. GENDER: male DOB: 1945/04/27, MRN: [id number]  Chief Complaint  Patient presents with   Follow-up    Patient initially seen in consultation in the hospital:  Patient is 76 year old male with pertinent PMH of CLL, aortic arch aneurysm, BPH presents to [facility] ED on 9/4 with hypoxic respiratory failure.   Patient recently seen in early August 14, 2021 for right-sided pneumonia.  Was treated with Zithromax for 5 days.  On 8/19 patient failed to improve and went to urgent care.  Antibiotics broadened to Doxy for 10 days with 5-day steroid taper. Patient's symptoms much improved on abx. Since stopping abx patient started having worsening dyspnea.  On 9/4 patient was having progressively worsening sob.  Was checking sats at home on room air noted to be in 6s.  Denies fevers.  Came to [facility] ED for further eval.   Patient admitted to [facility] on 9/4.  Noted to be febrile 100.3 F.  On room air with sats 88%.  Was started on supplemental O2.  COVID/flu negative.  WBC 17.1.  BNP 73.  CXR showing opacity in left hemothorax.  CTA chest showing no PE; B/L lower lobe pneumonia (L greater than R).  Urine Legionella/strep pending. PCT <0.10. PCCM consulted for recurrent pneumonia.  OV 10/09/2021: Here today for follow-up.  Patient is feeling much better since his hospitalization.  Using his inhalers.  He does have planned follow-up with cardiothoracic surgery regarding his aortic saccular aneurysm.  He has been using Breztri twice a day and as needed albuterol.  He had pulmonary function test completed prior to today's office visit that shows mild stage I COPD.    Past Medical History:  Diagnosis Date   Bowel obstruction (HCC)    Diverticul disease small and large intestine, no perforati or abscess    H/O urinary infection    Shingles outbreak 10/03/2013   Stroke ([place]) 01/2019      Family History  Problem Relation Age of Onset   Cancer Mother        pancreatic   COPD Father    Diabetes Sister    Diabetes Brother    Diabetes Brother      Past Surgical History:  Procedure Laterality Date   HERNIA REPAIR     MECKEL DIVERTICULUM EXCISION     ORIF FINGER / THUMB FRACTURE      Social History   Socioeconomic History   Marital status: Married    Spouse name: [name]   Number of children: Not on file   Years of education: Not on file   Highest education level: Some college, no degree  Occupational History    Comment: retired Corporate treasurer, Biomedical scientist, works part time  Tobacco Use   Smoking status: Former    Packs/day: 0.50    Years: 23.00    Total pack years: 11.50    Types: Cigarettes    Quit date: 03/05/1988    Years since quitting: 33.6   Smokeless tobacco: Never  Vaping Use   Vaping Use: Never used  Substance and Sexual Activity   Alcohol use: Not Currently    Comment: rarely   Drug use: No   Sexual activity: Not on file  Other Topics Concern   Not on file  Social History Narrative   lives with wife   Caffeine- coffee 1-2 daily   Social Determinants of Radio broadcast assistant  Strain: Low Risk  (09/16/2021)   Overall Financial Resource Strain (CARDIA)    Difficulty of Paying Living Expenses: Not hard at all  Food Insecurity: Not on file  Transportation Needs: No Transportation Needs (09/16/2021)   PRAPARE - Hydrologist (Medical): No    Lack of Transportation (Non-Medical): No  Physical Activity: Not on file  Stress: Not on file  Social Connections: Not on file  Intimate Partner Violence: Not on file     Allergies  Allergen Reactions   Levaquin [Levofloxacin] Other (See Comments)    Due to medical hx     Outpatient Medications Prior to Visit  Medication Sig Dispense Refill   Budeson-Glycopyrrol-Formoterol (BREZTRI AEROSPHERE) 160-9-4.8 MCG/ACT AERO Inhale 2 puffs into the lungs 2 (two) times daily. 1 g 11    Magnesium Citrate 200 MG TABS Take 400 mg by mouth daily.     Menthol-Methyl Salicylate (MUSCLE RUB) 10-15 % CREA Apply topically as needed for muscle pain. 85 g 0   tamsulosin (FLOMAX) 0.4 MG CAPS capsule Take 0.4 mg by mouth daily.     umeclidinium bromide (INCRUSE ELLIPTA) 62.5 MCG/ACT AEPB Inhale 1 puff into the lungs daily.     acetaminophen (TYLENOL) 325 MG tablet Take 2 tablets (650 mg total) by mouth every 6 (six) hours as needed for mild pain (or Fever >/= 101). 20 tablet 0   cefdinir (OMNICEF) 300 MG capsule Take 300 mg by mouth 2 (two) times daily.     ondansetron (ZOFRAN) 4 MG tablet Take 1 tablet (4 mg total) by mouth every 6 (six) hours as needed for nausea. 20 tablet 0   No facility-administered medications prior to visit.    Review of Systems  Constitutional:  Negative for chills, fever, malaise/fatigue and weight loss.  HENT:  Negative for hearing loss, sore throat and tinnitus.   Eyes:  Negative for blurred vision and double vision.  Respiratory:  Positive for cough and sputum production. Negative for hemoptysis, shortness of breath, wheezing and stridor.   Cardiovascular:  Negative for chest pain, palpitations, orthopnea, leg swelling and PND.  Gastrointestinal:  Negative for abdominal pain, constipation, diarrhea, heartburn, nausea and vomiting.  Genitourinary:  Negative for dysuria, hematuria and urgency.  Musculoskeletal:  Negative for joint pain and myalgias.  Skin:  Negative for itching and rash.  Neurological:  Negative for dizziness, tingling, weakness and headaches.  Endo/Heme/Allergies:  Negative for environmental allergies. Does not bruise/bleed easily.  Psychiatric/Behavioral:  Negative for depression. The patient is not nervous/anxious and does not have insomnia.   All other systems reviewed and are negative.    Objective:  Physical Exam Vitals reviewed.  Constitutional:      General: He is not in acute distress.    Appearance: He is well-developed.   HENT:     Head: Normocephalic and atraumatic.  Eyes:     General: No scleral icterus.    Conjunctiva/sclera: Conjunctivae normal.     Pupils: Pupils are equal, round, and reactive to light.  Neck:     Vascular: No JVD.     Trachea: No tracheal deviation.  Cardiovascular:     Rate and Rhythm: Normal rate and regular rhythm.     Heart sounds: Normal heart sounds. No murmur heard. Pulmonary:     Effort: Pulmonary effort is normal. No tachypnea, accessory muscle usage or respiratory distress.     Breath sounds: No stridor. No wheezing, rhonchi or rales.  Abdominal:     General: There is  no distension.     Palpations: Abdomen is soft.     Tenderness: There is no abdominal tenderness.  Musculoskeletal:        General: No tenderness.     Cervical back: Neck supple.  Lymphadenopathy:     Cervical: No cervical adenopathy.  Skin:    General: Skin is warm and dry.     Capillary Refill: Capillary refill takes less than 2 seconds.     Findings: No rash.  Neurological:     Mental Status: He is alert and oriented to person, place, and time.  Psychiatric:        Behavior: Behavior normal.      Vitals:   10/09/21 1455  BP: 120/80  Pulse: (!) 59  SpO2: 96%  Weight: 180 lb 12.8 oz (82 kg)  Height: 5' 10.5" (1.791 m)   96% on RA BMI Readings from Last 3 Encounters:  10/09/21 25.58 kg/m  09/25/21 25.10 kg/m  09/12/21 25.09 kg/m   Wt Readings from Last 3 Encounters:  10/09/21 180 lb 12.8 oz (82 kg)  09/25/21 180 lb (81.6 kg)  09/12/21 179 lb 14.3 oz (81.6 kg)     CBC    Component Value Date/Time   WBC 25.4 (H) 09/13/2021 0712   RBC 4.27 09/13/2021 0712   HGB 13.4 09/13/2021 0712   HGB 14.6 08/26/2021 0943   HCT 40.1 09/13/2021 0712   PLT 108 (L) 09/13/2021 0712   PLT 146 (L) 08/26/2021 0943   MCV 93.9 09/13/2021 0712   MCH 31.4 09/13/2021 0712   MCHC 33.4 09/13/2021 0712   RDW 13.5 09/13/2021 0712   LYMPHSABS 17.3 (H) 09/13/2021 0712   MONOABS 0.5 09/13/2021  0712   EOSABS 0.0 09/13/2021 0712   BASOSABS 0.0 09/13/2021 0712    Chest Imaging: Recent CT chest imaging reviewed  Pulmonary Functions Testing Results:    Latest Ref Rng & Units 10/07/2021   12:57 PM  PFT Results  FVC-Pre L 4.73  P  FVC-Predicted Pre % 107  P  FVC-Post L 4.54  P  FVC-Predicted Post % 103  P  Pre FEV1/FVC % % 69  P  Post FEV1/FCV % % 66  P  FEV1-Pre L 3.28  P  FEV1-Predicted Pre % 103  P  FEV1-Post L 3.02  P  DLCO uncorrected ml/min/mmHg 14.33  P  DLCO UNC% % 55  P  DLCO corrected ml/min/mmHg 14.72  P  DLCO COR %Predicted % 56  P  DLVA Predicted % 56  P  TLC L 7.61  P  TLC % Predicted % 103  P  RV % Predicted % 99  P    P Preliminary result    FeNO:   Pathology:   Echocardiogram:   Heart Catheterization:     Assessment & Plan:     ICD-10-CM   1. Community acquired bilateral lower lobe pneumonia  J18.9     2. Stage 1 mild COPD by GOLD classification ([place])  J44.9     3. 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  Has recovered from his recent community-acquired pneumonia while he was in the hospital.  No reservations from my standpoint for undergoing surgical repair of his aortic arch.  He has a follow-up appointment to see Dr. Darcey Nora soon.  Plan: Have reviewed his PFTs today in the office. Continue Breztri inhaler Continue albuterol as needed. Patient to follow-up  with [place] in clinic in 6 months or as needed.   Current Outpatient Medications:    Budeson-Glycopyrrol-Formoterol (BREZTRI AEROSPHERE) 160-9-4.8 MCG/ACT AERO, Inhale 2 puffs into the lungs 2 (two) times daily., Disp: 1 g, Rfl: 11   Magnesium Citrate 200 MG TABS, Take 400 mg by mouth daily., Disp: , Rfl:    Menthol-Methyl Salicylate (MUSCLE RUB) 10-15 % CREA, Apply topically as needed for muscle pain., Disp: 85 g, Rfl: 0   tamsulosin (FLOMAX) 0.4 MG CAPS capsule, Take 0.4 mg by mouth daily., Disp:  , Rfl:    umeclidinium bromide (INCRUSE ELLIPTA) 62.5 MCG/ACT AEPB, Inhale 1 puff into the lungs daily., Disp: , Rfl:   Garner Nash, DO [facility] 10/09/2021 3:23 PM

## 2021-10-11 DIAGNOSIS — M25531 Pain in right wrist: Secondary | ICD-10-CM | POA: Diagnosis not present

## 2021-10-11 DIAGNOSIS — G5601 Carpal tunnel syndrome, right upper limb: Secondary | ICD-10-CM | POA: Diagnosis not present

## 2021-10-11 DIAGNOSIS — M79641 Pain in right hand: Secondary | ICD-10-CM | POA: Diagnosis not present

## 2021-10-16 ENCOUNTER — Other Ambulatory Visit: Payer: Self-pay | Admitting: Cardiothoracic Surgery

## 2021-10-16 DIAGNOSIS — I7122 Aneurysm of the aortic arch, without rupture: Secondary | ICD-10-CM

## 2021-10-17 ENCOUNTER — Ambulatory Visit
Admission: RE | Admit: 2021-10-17 | Discharge: 2021-10-17 | Disposition: A | Discharge status: Home / Self Care | Payer: Medicare Other | Source: Ambulatory Visit | Attending: Cardiothoracic Surgery | Admitting: Cardiothoracic Surgery

## 2021-10-17 ENCOUNTER — Encounter: Payer: Medicare Other | Admitting: Cardiothoracic Surgery

## 2021-10-17 ENCOUNTER — Encounter: Payer: Self-pay | Admitting: Cardiothoracic Surgery

## 2021-10-17 ENCOUNTER — Ambulatory Visit (INDEPENDENT_AMBULATORY_CARE_PROVIDER_SITE_OTHER): Payer: Medicare Other | Admitting: Cardiothoracic Surgery

## 2021-10-17 VITALS — BP 136/75 | HR 72 | Resp 20 | Ht 70.5 in | Wt 181.0 lb

## 2021-10-17 DIAGNOSIS — I7122 Aneurysm of the aortic arch, without rupture: Secondary | ICD-10-CM

## 2021-10-17 DIAGNOSIS — I712 Thoracic aortic aneurysm, without rupture, unspecified: Secondary | ICD-10-CM | POA: Diagnosis not present

## 2021-10-17 DIAGNOSIS — I700 Atherosclerosis of aorta: Secondary | ICD-10-CM | POA: Diagnosis not present

## 2021-10-17 NOTE — Progress Notes (Deleted)
The

## 2021-10-17 NOTE — Progress Notes (Signed)
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  He was discharged from the hospital 4 weeks ago for fairly significant bilateral pneumonia in the setting of COPD.  He has not fully recovered from the pneumonia as far as his energy and weight loss.  He is now on room air at home with oxygen saturation about 95%.  He has had no cough and has improving energy.  Patient has been seen by Dr. Jamey Ripa for his lung disease.  His PFTs recently were performed which are satisfactory for sternotomy.  He had a cardiac MRI which showed only mild coronary disease and is previous reviewed recent echocardiogram shows normal biventricular function without valvular disease.  Patient is nearing the appropriate time to schedule his sternotomy and deep branching of the aortic arch with associated TEVAR by Dr. Trula Slade to exclude the arch aneurysm.  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  For that reason the patient will be scheduled for a planned right carotid subclavian bypass by Dr. Trula Slade which will be performed at least a week before the sternotomy and full arch de- branching with TEVAR.  I reviewed all of this information and planning with the patient and wife. Current Outpatient Medications  Medication Sig Dispense Refill   Budeson-Glycopyrrol-Formoterol (BREZTRI AEROSPHERE) 160-9-4.8 MCG/ACT AERO Inhale 2 puffs into the lungs 2 (two) times daily. 1 g 11   Magnesium Citrate 200 MG TABS Take 400 mg by mouth daily.     Menthol-Methyl Salicylate (MUSCLE RUB) 10-15 % CREA Apply topically as needed for muscle pain. 85 g 0   tamsulosin (FLOMAX) 0.4 MG CAPS capsule Take 0.4 mg by mouth daily.     No current facility-administered medications for this visit.     Review of Systems: Strength and weight gradually improving  back to prehospitalization status after significant bilateral pneumonia  Physical Exam Blood pressure 136/75, pulse 72, resp. rate 20, height 5' 10.5" (1.791 m), weight 181 lb (82.1 kg), SpO2 97 %.   Saturation on room air at rest is 94%.  After walking 75 feet O2 saturation increased to 98%.  General-alert and appropriate ENT-no JVD carotid bruit or adenopathy Chest-scattered dry rales right base Cardiac-normal rhythm without murmur or gallop Abdomen-soft without mass or tenderness Extremities-no clubbing cyanosis tenderness.  Good peripheral pulses  Diagnostic Tests: Carotid Dopplers are satisfactory PFTs with diffusion capacity 56% FEV1 3.4, F VC 4.1  Impression: 6 cm arch aneurysm on the lesser curvature to be treated with arch to branching and covered stent graft. 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 Plan: Return in 3 weeks for further evaluation planning.  Continue current medications.   Dahlia Byes, MD [facility] [phone]

## 2021-11-04 ENCOUNTER — Ambulatory Visit (INDEPENDENT_AMBULATORY_CARE_PROVIDER_SITE_OTHER): Payer: Medicare Other | Admitting: Surgery

## 2021-11-04 ENCOUNTER — Other Ambulatory Visit: Payer: Self-pay

## 2021-11-04 ENCOUNTER — Encounter: Payer: Self-pay | Admitting: Surgery

## 2021-11-04 VITALS — BP 113/75 | HR 66 | Temp 98.0°F | Resp 20 | Ht 70.5 in | Wt 188.0 lb

## 2021-11-04 DIAGNOSIS — I7122 Aneurysm of the aortic arch, without rupture: Secondary | ICD-10-CM

## 2021-11-04 DIAGNOSIS — Z5189 Encounter for other specified aftercare: Secondary | ICD-10-CM

## 2021-11-04 NOTE — Progress Notes (Signed)
[facility] [place]  Patient name: Jason Abbott. MRN: [id number] DOB: 1945/06/18 Sex: male   REASON FOR VISIT:    Follow up  HISOTRY OF PRESENT ILLNESS:    Jason Abbott. is a 76 y.o. male, who is today for evaluation of a aortic arch aneurysm that was first noticed approximately 2 years ago during imaging for COVID.  At that time was 4.9 cm.  He recently had a scan showing that it has increased in size to approximately 6 cm.  He is being considered for arch reconstruction and thoracic stent graft to exclude his aneurysm.  Unfortunately, he was hospitalized for pneumonia and so his procedure has been delayed.  He comes in today for further discussion and follow-up.  He appears to have recovered from his pneumonia.   He is very active and plays an electric accordion regularly which weighs approximately 28 pounds.  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  He has had a stroke in the past with no residual deficits.  He is a former smoker.  He does not report any trauma other than a injury in the [employer] where he hurt his [facility] joint from a significant fall   PAST MEDICAL HISTORY:   Past Medical History:  Diagnosis Date   Bowel obstruction ([place])    Diverticul disease small and large intestine, no perforati or abscess    H/O urinary infection    Shingles outbreak 10/03/2013   Stroke ([place]) 01/2019     FAMILY HISTORY:   Family History  Problem Relation Age of Onset   Cancer Mother        pancreatic   COPD Father    Diabetes Sister    Diabetes Brother    Diabetes Brother     SOCIAL HISTORY:   Social History   Tobacco Use   Smoking status: Former    Packs/day: 0.50    Years: 23.00    Total pack years: 11.50    Types: Cigarettes    Quit date: 03/05/1988    Years since quitting: 33.6   Smokeless tobacco: Never  Substance Use Topics   Alcohol use: Not Currently    Comment:  rarely     ALLERGIES:   Allergies  Allergen Reactions   Levaquin [Levofloxacin] Other (See Comments)    Due to medical hx     CURRENT MEDICATIONS:   Current Outpatient Medications  Medication Sig Dispense Refill   Budeson-Glycopyrrol-Formoterol (BREZTRI AEROSPHERE) 160-9-4.8 MCG/ACT AERO Inhale 2 puffs into the lungs 2 (two) times daily. 1 g 11   Magnesium Citrate 200 MG TABS Take 400 mg by mouth daily.     tamsulosin (FLOMAX) 0.4 MG CAPS capsule Take 0.4 mg by mouth daily.     No current facility-administered medications for this visit.    REVIEW OF SYSTEMS:   '[X]'$  denotes positive finding, '[ ]'$  denotes negative finding Cardiac  Comments:  Chest pain or chest pressure:    Shortness of breath upon exertion:    Short of breath when lying flat:    Irregular heart rhythm:        Vascular    Pain in calf, thigh, or hip brought on by ambulation:    Pain in feet at night that wakes you up from your sleep:     Blood clot in your veins:    Leg swelling:         Pulmonary  Oxygen at home:    Productive cough:     Wheezing:         Neurologic    Sudden weakness in arms or legs:     Sudden numbness in arms or legs:     Sudden onset of difficulty speaking or slurred speech:    Temporary loss of vision in one eye:     Problems with dizziness:         Gastrointestinal    Blood in stool:     Vomited blood:         Genitourinary    Burning when urinating:     Blood in urine:        Psychiatric    Major depression:         Hematologic    Bleeding problems:    Problems with blood clotting too easily:        Skin    Rashes or ulcers:        Constitutional    Fever or chills:      PHYSICAL EXAM:   Vitals:   11/04/21 1032  BP: 113/75  Pulse: 66  Resp: 20  Temp: 98 F (36.7 C)  SpO2: 96%  Weight: 188 lb (85.3 kg)  Height: 5' 10.5" (1.791 m)    GENERAL: The patient is a well-nourished male, in no acute distress. The vital signs are documented  above. CARDIAC: There is a regular rate and rhythm.  PULMONARY: Non-labored respirations ABDOMEN: Soft and non-tender with normal pitched bowel sounds.  MUSCULOSKELETAL: There are no major deformities or cyanosis. NEUROLOGIC: No focal weakness or paresthesias are detected. SKIN: There are no ulcers or rashes noted. PSYCHIATRIC: The patient has a normal affect.  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  I discussed with the patient that we would begin with a right carotid subclavian transposition versus bypass.  This will be through a right supraclavicular incision.  We will evaluate how he does postoperatively before proceeding with aortic arch reconstruction and thoracic stent graft.  I discussed the details of the operation including the risk of lymphatic leak infection and cardiopulmonary issues.  All questions were answered.  We will schedule his right carotid subclavian surgery in the near future and then get him scheduled for his sternotomy shortly thereafter.    [name], MD, FACS [facility] [phone] Pager [phone]

## 2021-11-05 ENCOUNTER — Encounter: Payer: Self-pay | Admitting: Surgery

## 2021-11-06 ENCOUNTER — Encounter: Payer: Medicare Other | Admitting: Cardiothoracic Surgery

## 2021-11-06 NOTE — Pre-Procedure Instructions (Signed)
Surgical Instructions    Your procedure is scheduled on Wednesday November 8.  Report to [facility] Main Entrance "A" at 8:45 A.M., then check in with the Admitting office.  Call this number if you have problems the morning of surgery:  [phone]  If you have any questions prior to your surgery date call [phone]: Open Monday-Friday 8am-4pm  If you experience any cold, Covid, or flu symptoms such as cough, fever, chills, shortness of breath, etc. between now and your scheduled surgery, please notify us at the above number    Do not eat or drink after midnight the night before your surgery   Take these medications the morning of surgery with A SIP OF WATER:  Budeson-Glycopyrrol-Formoterol (BREZTRI AEROSPHERE) tamsulosin (FLOMAX)  As of today, STOP taking any Aspirin (unless otherwise instructed by your surgeon) Aleve, Naproxen, Ibuprofen, Motrin, Advil, Goody's, BC's, all herbal medications, fish oil, and all vitamins.          Do NOT Smoke (Tobacco/Vaping)  24 hours prior to your procedure  If you use a CPAP at night, you may bring your mask for your overnight stay.   Contacts, glasses, hearing aids, dentures or partials may not be worn into surgery, please bring cases for these belongings   For patients admitted to the hospital, discharge time will be determined by your treatment team.   Patients discharged the day of surgery will not be allowed to drive home, and someone needs to stay with them for 24 hours.   SURGICAL WAITING ROOM VISITATION Patients having surgery or a procedure may have no more than 2 support people in the waiting area - these visitors may rotate.   Children under the age of 50 must have an adult with them who is not the patient. If the patient needs to stay at the hospital during part of their recovery, the visitor guidelines for inpatient rooms apply. Pre-op nurse will coordinate an appropriate time for 1 support person to accompany patient in  pre-op.  This support person may not rotate.   Please refer to the [facility] website for the visitor guidelines for Inpatients (after your surgery is over and you are in a regular room).    Special instructions:    Oral Hygiene is also important to reduce your risk of infection.  Remember -  BRUSH YOUR TEETH THE MORNING OF SURGERY WITH YOUR REGULAR TOOTHPASTE   [facility]- Preparing For Surgery  Before surgery, you can play an important role. Because skin is not sterile, your skin needs to be as free of germs as possible. You can reduce the number of germs on your skin by washing with CHG (chlorahexidine gluconate) Soap before surgery.  CHG is an antiseptic cleaner which kills germs and bonds with the skin to continue killing germs even after washing.     Please do not use if you have an allergy to CHG or antibacterial soaps. If your skin becomes reddened/irritated stop using the CHG.  Do not shave (including legs and underarms) for at least 48 hours prior to first CHG shower. It is OK to shave your face.  Please follow these instructions carefully.    Shower the NIGHT BEFORE SURGERY and the MORNING OF SURGERY with CHG Soap.  If you chose to wash your hair, wash your hair first as usual with your normal shampoo.  After you shampoo, rinse your hair and body thoroughly to remove the shampoo.   Then [employer] and genitals (private parts) with your normal  soap and rinse thoroughly to remove soap.  After that Use CHG Soap as you would any other liquid soap.  You can apply CHG directly to the skin and wash gently with a scrungie or a clean washcloth.   Apply the CHG Soap to your body ONLY FROM THE NECK DOWN.   Do not use on open wounds or open sores.  Avoid contact with your eyes, ears, mouth and genitals (private parts).  Wash thoroughly, paying special attention to the area where your surgery will be performed.  Thoroughly rinse your body with warm water from the neck down.  DO  NOT shower/wash with your normal soap after using and rinsing off the CHG Soap.  Pat yourself dry with a CLEAN TOWEL.  Wear CLEAN PAJAMAS to bed the night before surgery  Place CLEAN SHEETS on your bed the night before your surgery  DO NOT SLEEP WITH PETS.   Day of Surgery:  Take a shower with CHG soap. Wear Clean/Comfortable clothing the morning of surgery Brush your teeth WITH YOUR REGULAR TOOTHPASTE. Do not wear jewelry. Do not wear lotions, powders, cologne or deodorant. Men may shave face and neck. Do not bring valuables to the hospital.  [facility] is not responsible for any belongings or valuables.    If you received a COVID test during your pre-op visit, it is requested that you wear a mask when out in public, stay away from anyone that may not be feeling well, and notify your surgeon if you develop symptoms. If you have been in contact with anyone that has tested positive in the last 10 days, please notify your surgeon.    Please read over the following fact sheets that you were given.

## 2021-11-07 ENCOUNTER — Other Ambulatory Visit (HOSPITAL_COMMUNITY): Payer: Medicare Other

## 2021-11-08 ENCOUNTER — Telehealth: Payer: Self-pay

## 2021-11-08 DIAGNOSIS — G5601 Carpal tunnel syndrome, right upper limb: Secondary | ICD-10-CM | POA: Diagnosis not present

## 2021-11-08 DIAGNOSIS — M65831 Other synovitis and tenosynovitis, right forearm: Secondary | ICD-10-CM | POA: Diagnosis not present

## 2021-11-08 NOTE — Telephone Encounter (Signed)
Received a telephone call from patient requesting to postpone his right carotid-subclavian bypass surgery scheduled on 11/13/21 until after he has hand surgery completed first to repair carpal tunnel and tendonitis of right hand that is causing him extreme pain and difficulty sleeping. Surgery has tentatively rescheduled for 12/18/21. Will make MD aware.

## 2021-11-09 ENCOUNTER — Encounter: Payer: Self-pay | Admitting: Cardiothoracic Surgery

## 2021-11-11 ENCOUNTER — Encounter: Payer: Self-pay | Admitting: *Deleted

## 2021-11-11 NOTE — Progress Notes (Signed)
Patient's wife called requesting to cancel PAT appt at this time because surgery was cancelled by provider. PAT appt cancelled.

## 2021-11-12 ENCOUNTER — Inpatient Hospital Stay (HOSPITAL_COMMUNITY)
Admission: RE | Admit: 2021-11-12 | Discharge: 2021-11-12 | Disposition: A | Discharge status: Home / Self Care | Payer: Medicare Other | Source: Ambulatory Visit

## 2021-11-12 ENCOUNTER — Other Ambulatory Visit: Payer: Self-pay | Admitting: Sports Medicine

## 2021-11-12 DIAGNOSIS — R1013 Epigastric pain: Secondary | ICD-10-CM

## 2021-11-15 DIAGNOSIS — M25531 Pain in right wrist: Secondary | ICD-10-CM | POA: Diagnosis not present

## 2021-11-15 DIAGNOSIS — M65831 Other synovitis and tenosynovitis, right forearm: Secondary | ICD-10-CM | POA: Diagnosis not present

## 2021-11-15 DIAGNOSIS — G5601 Carpal tunnel syndrome, right upper limb: Secondary | ICD-10-CM | POA: Diagnosis not present

## 2021-11-20 ENCOUNTER — Encounter: Payer: Self-pay | Admitting: Surgery

## 2021-11-22 ENCOUNTER — Other Ambulatory Visit: Payer: Self-pay

## 2021-11-22 ENCOUNTER — Encounter (HOSPITAL_COMMUNITY): Payer: Self-pay | Admitting: Orthopedic Surgery

## 2021-11-22 NOTE — Progress Notes (Signed)
Spoke with pt for pre-op call. Pt denies CAD. 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 Pt denies any recent chest pain or shortness of breath. Pt is using Breztri inhaler due to having pneumonia in September of this year.   Shower instructions given to pt and he voiced understanding.

## 2021-11-22 NOTE — Anesthesia Preprocedure Evaluation (Signed)
Anesthesia Evaluation  Patient identified by MRN, date of birth, ID band Patient awake    Reviewed: Allergy & Precautions, NPO status , Patient's Chart, lab work & pertinent test results  Airway Mallampati: I  TM Distance: >3 FB Neck ROM: Full    Dental  (+) Dental Advisory Given   Pulmonary pneumonia, resolved, COPD, former smoker   breath sounds clear to auscultation       Cardiovascular + Peripheral Vascular Disease   Rhythm:Regular Rate:Normal     Neuro/Psych CVA, No Residual Symptoms    GI/Hepatic Neg liver ROS,GERD  ,,  Endo/Other  negative endocrine ROS    Renal/GU negative Renal ROS     Musculoskeletal  (+) Arthritis ,    Abdominal   Peds  Hematology negative hematology ROS (+)   Anesthesia Other Findings   Reproductive/Obstetrics                             Anesthesia Physical Anesthesia Plan  ASA: 4  Anesthesia Plan: Regional and MAC   Post-op Pain Management: Tylenol PO (pre-op)* and Regional block*   Induction:   PONV Risk Score and Plan: 1 and Propofol infusion, Ondansetron and Treatment may vary due to age or medical condition  Airway Management Planned: Natural Airway and Simple Face Mask  Additional Equipment: None  Intra-op Plan:   Post-operative Plan:   Informed Consent: I have reviewed the patients History and Physical, chart, labs and discussed the procedure including the risks, benefits and alternatives for the proposed anesthesia with the patient or authorized representative who has indicated his/her understanding and acceptance.       Plan Discussed with: CRNA  Anesthesia Plan Comments: (  )       Anesthesia Quick Evaluation

## 2021-11-22 NOTE — Progress Notes (Signed)
Anesthesia Chart Review: SAME DAY WORK-UP  Case: [id number] Date/Time: 11/25/21 1034   Procedures:      CARPAL TUNNEL RELEASE (Right) - or MAC with regional block 60     FLEXOR TENOSYNOVECTOMY (Right) - or MAC with regional block 60   Anesthesia type: General   Pre-op diagnosis: right carpal tunnel syndrome, right flexor tenosynotvitis   Location: [facility] OR ROOM 08 / [place] OR   Surgeons: Sherilyn Cooter, MD       DISCUSSION: Patient is a 76 year old male scheduled for the above procedure. Case was added on 11/22/21 for Monday 11/25/21, so he is a same day work-up.  He is followed by CT surgeon Dr. Prescott Gum for a 6 cm aortic arch aneurysm. He was last evaluated on 10/18/11 about one month post-hospitalization for bilateral pneumonia. He wrote, "Patient is nearing the appropriate time to schedule his sternotomy and deep branching of the aortic arch with associated TEVAR by Dr. Trula Slade to exclude the arch aneurysm.  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  For that reason the patient will be scheduled for a planned right carotid subclavian bypass by Dr. Trula Slade which will be performed at least a week before the sternotomy and full arch de- branching with TEVAR." Right carotid subclavian bypass is scheduled for 12/18/21.  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  Last pulmonology visit was with Dr. Valeta Harms on 10/09/21 for post pneumonia Hospital follow-up.  Patient was feeling much better.  He been using Breztri twice a day and as needed albuterol.  PFT showed mild stage I COPD. He wrote, "Has recovered from his recent  community-acquired pneumonia while he was in the hospital.  No reservations from my standpoint for undergoing surgical repair of his aortic arch."    His last visit with vascular surgeon Dr. Trula Slade was on 11/04/21.  Right carotid subclavian surgery was anticipated in the near future--scheduled for 12/18/21. He is scheduled for .  As above, he has pending right carotid subclavian surgery as well as sternotomy and full arch to branching with TEVAR. He apparently has debilitating symptoms from his right carpal tunnel syndrome and desired to have surgery ASAP to allow time to heal and recover before undergoing vascular and cardiothoracic procedures. He has reported "clearance" by Dr. Trula Slade, but he is now out of town until 12/02/21, so no formal clearance letter was singed (although reportedly he and Dr. Tempie Donning did speak about this mutual patient). I was able to speak with CT surgeon Dr. Prescott Gum. He confirmed the he and Dr. Trula Slade were aware of Mr. Heskett's need for carpal tunnel syndrome surgery. He said recently Mr. Klima has been "stable" from an aortic aneurysm standpoint, and given his severe hand symptoms and because surgery soon after his arch debranching with TEVAR may not be possible, Dr. Prescott Gum was in agreement that Mr Ambrosini should be able to proceed with hand surgery as scheduled.   Anesthesia team to evaluate on the day of surgery. Labs on arrival as indicated. He has known elevated WBC given CLL history. Updated anesthesiologist Tamela Gammon, MD of CT surgery input.    VS:  BP Readings from Last 3 Encounters:  11/04/21 113/75  10/17/21 136/75  10/09/21 120/80   Pulse Readings from Last 3 Encounters:  11/04/21 66  10/17/21 72  10/09/21 (!) 59     PROVIDERS: Silverio Decamp, MD is PCP  June Leap, DO is pulmonologist Ivin Poot, MD is CT surgeon Serafina Mitchell, MD is vascular surgeon Burney Gauze, MD is HEM-ONC. Last note 09/12/21 during position for  pneumonia.  At that time he did not think that patient CLL was playing a major factor in respect to his pneumonia and did not recommend IVIG at that time.   LABS: For day of surgery as indicated. Last lab results in [facility] include:  Lab Results  Component Value Date   WBC 25.4 (H) 09/13/2021   HGB 13.4 09/13/2021   HCT 40.1 09/13/2021   PLT 108 (L) 09/13/2021   GLUCOSE 93 09/13/2021   ALT 19 09/13/2021   AST 16 09/13/2021   NA 137 09/13/2021   K 4.5 09/13/2021   CL 102 09/13/2021   CREATININE 0.91 09/13/2021   BUN 22 09/13/2021   CO2 26 09/13/2021     PFTs 10/07/21: FVC 4.73 (107%), post 4.54 (103%). FEV1 3.28 (103%), post 3.02 (94%). DLCO unc 14.33 (55%), cor 14.72 (56%).   IMAGES: CXR 10/17/21: IMPRESSION: Stable saccular aneurysm along the inferior aspect of the aortic arch.  MRI Brain 09/28/21: IMPRESSION: No evidence of acute intracranial abnormality. Mild chronic small vessel ischemic changes within the cerebral white matter.  CTA Chest 09/09/21: IMPRESSION: 1. No CT evidence of central acute pulmonary artery embolus. 2. Left-sided aortic arch with aberrant right subclavian artery anatomy. No periaortic fluid collection. 3. 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 Follow-up as per recommendation of the prior CT with cardiothoracic surgery consult. 4. Aortic Atherosclerosis (ICD10-I70.0) and Emphysema (ICD10-J43.9).  CTA Chest 08/19/21: IMPRESSION: - 6.0 x 4.6 cm saccular aneurysm is seen arising from inferior aspect of the transverse aortic arch which is significantly enlarged compared to prior exam. Cardiothoracic surgery consultation recommended due to increased risk of rupture for arch aneurysm ? 5.5 cm. This recommendation follows 2010 ACCF/AHA/AATS/ACR/ASA/SCA/SCAI/SIR/STS/SVM Guidelines for the Diagnosis and Management of Patients With Thoracic Aortic Disease. Circulation. 2010; 121: P509-T267. Aortic aneurysm  NOS (ICD10-I71.9). These results will be called to the ordering clinician or representative by the Radiologist Assistant, and communication documented in the PACS or zVision Dashboard. - Coronary artery calcifications are noted. 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 - Minimal biapical scarring is noted. Mild bibasilar subsegmental atelectasis is noted. - Aortic Atherosclerosis (ICD10-I70.0).   EKG: 09/09/21: Sinus rhythm Atrial premature complex Right bundle branch block Inferior infarct, old No old tracing to compare Confirmed by Sherwood Gambler [phone]) on 09/09/2021 10:35:12 PM   CV: CT Coronary 09/20/21: IMPRESSION: 1. 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 Low attenuation plaque with positive remodeling in proximal LAD, CADRADS 2V. 2. Probable moderate mixed plaque in the small caliber mid first diagonal branch, 50-69% stenosis, with proximal vessel lumen <2 mm. Due to vessel size, CT FFR will not be performed. 3. Coronary calcium score is 436, which places the patient in the 58th percentile for age and sex matched control. 4. Normal coronary origins with right dominance. 5. Dilated thoracic aorta not fully evaluated on this exam. Mild dilation of sinus of Valsalva (41 mm) and mid ascending aorta (40 mm). RECOMMENDATIONS: CAD-RADS 2. Mild non-obstructive CAD (25-49%). Consider non-atherosclerotic causes of chest pain. Consider  preventive therapy and risk factor modification. "V" modifier indicates vulnerable plaque characteristics, >/=2 in one lesions (positive remodeling, low attenuation plaque).   Echo 09/16/21: IMPRESSIONS   1. Left ventricular ejection fraction, by estimation, is 55 to 60%. The  left ventricle has normal function. The left ventricle has no regional  wall motion abnormalities. Left ventricular diastolic parameters are  indeterminate.   2. Right ventricular  systolic function is normal. The right ventricular  size is mildly enlarged. There is normal pulmonary artery systolic  pressure.   3. Right atrial size was severely dilated.   4. The mitral valve is normal in structure. No evidence of mitral valve  regurgitation. No evidence of mitral stenosis.   5. The aortic valve is tricuspid. Aortic valve regurgitation is not  visualized. Aortic valve sclerosis is present, with no evidence of aortic  valve stenosis.   6. There is moderate dilatation of the aortic root, measuring 43 mm.   7. The inferior vena cava is normal in size with greater than 50%  respiratory variability, suggesting right atrial pressure of 3 mmHg.    US Carotid 09/12/21: Summary:  - Right Carotid: The extracranial vessels were near-normal with only minimal wall thickening or plaque.  - Left Carotid: The extracranial vessels were near-normal with only minimal wall thickening or plaque.  - Vertebrals:  Bilateral vertebral arteries demonstrate antegrade flow.  - Subclavians: Normal flow hemodynamics were seen in bilateral subclavian arteries.    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    Budeson-Glycopyrrol-Formoterol (BREZTRI AEROSPHERE) 160-9-4.8 MCG/ACT AERO   Magnesium Citrate 200 MG TABS   Multiple  Vitamins-Minerals (MULTIVITAMIN GUMMIES ADULTS PO)   tamsulosin (FLOMAX) 0.4 MG CAPS capsule    Myra Gianotti, PA-C Surgical Short Stay/Anesthesiology [facility] Phone [phone] [facility] Phone [phone] 11/22/2021 5:55 PM

## 2021-11-25 ENCOUNTER — Other Ambulatory Visit: Payer: Self-pay

## 2021-11-25 ENCOUNTER — Ambulatory Visit (HOSPITAL_COMMUNITY)
Admission: RE | Admit: 2021-11-25 | Discharge: 2021-11-25 | Disposition: A | Discharge status: Home / Self Care | Payer: Medicare Other | Source: Ambulatory Visit | Attending: Orthopedic Surgery | Admitting: Orthopedic Surgery

## 2021-11-25 ENCOUNTER — Encounter (HOSPITAL_COMMUNITY)
Admission: RE | Disposition: A | Discharge status: Home / Self Care | Payer: Self-pay | Source: Ambulatory Visit | Attending: Orthopedic Surgery

## 2021-11-25 ENCOUNTER — Ambulatory Visit (HOSPITAL_BASED_OUTPATIENT_CLINIC_OR_DEPARTMENT_OTHER): Payer: Medicare Other | Admitting: Vascular Surgery

## 2021-11-25 ENCOUNTER — Ambulatory Visit (HOSPITAL_COMMUNITY): Payer: Medicare Other | Admitting: Vascular Surgery

## 2021-11-25 ENCOUNTER — Encounter (HOSPITAL_COMMUNITY): Payer: Self-pay | Admitting: Orthopedic Surgery

## 2021-11-25 DIAGNOSIS — M659 Synovitis and tenosynovitis, unspecified: Secondary | ICD-10-CM | POA: Diagnosis not present

## 2021-11-25 DIAGNOSIS — J449 Chronic obstructive pulmonary disease, unspecified: Secondary | ICD-10-CM

## 2021-11-25 DIAGNOSIS — Z87891 Personal history of nicotine dependence: Secondary | ICD-10-CM

## 2021-11-25 DIAGNOSIS — G5601 Carpal tunnel syndrome, right upper limb: Secondary | ICD-10-CM | POA: Diagnosis not present

## 2021-11-25 DIAGNOSIS — M65831 Other synovitis and tenosynovitis, right forearm: Secondary | ICD-10-CM | POA: Diagnosis not present

## 2021-11-25 DIAGNOSIS — G5602 Carpal tunnel syndrome, left upper limb: Secondary | ICD-10-CM | POA: Diagnosis not present

## 2021-11-25 HISTORY — DX: Aneurysm of the aortic arch, without rupture: I71.22

## 2021-11-25 HISTORY — DX: Other specified congenital malformations of peripheral vascular system: Q27.8

## 2021-11-25 HISTORY — PX: CARPAL TUNNEL RELEASE: SHX101

## 2021-11-25 HISTORY — DX: Gastro-esophageal reflux disease without esophagitis: K21.9

## 2021-11-25 HISTORY — DX: Chronic lymphocytic leukemia of B-cell type not having achieved remission: C91.10

## 2021-11-25 HISTORY — PX: TENOSYNOVECTOMY: SHX6110

## 2021-11-25 HISTORY — DX: Unspecified osteoarthritis, unspecified site: M19.90

## 2021-11-25 LAB — CBC
HCT: 41.4 % (ref 39.0–52.0)
Hemoglobin: 13.6 g/dL (ref 13.0–17.0)
MCH: 31.4 pg (ref 26.0–34.0)
MCHC: 32.9 g/dL (ref 30.0–36.0)
MCV: 95.6 fL (ref 80.0–100.0)
Platelets: 112 10*3/uL — ABNORMAL LOW (ref 150–400)
RBC: 4.33 MIL/uL (ref 4.22–5.81)
RDW: 14.2 % (ref 11.5–15.5)
WBC: 33.3 10*3/uL — ABNORMAL HIGH (ref 4.0–10.5)
nRBC: 0.2 % (ref 0.0–0.2)

## 2021-11-25 LAB — BASIC METABOLIC PANEL
Anion gap: 8 (ref 5–15)
BUN: 10 mg/dL (ref 8–23)
CO2: 26 mmol/L (ref 22–32)
Calcium: 9.3 mg/dL (ref 8.9–10.3)
Chloride: 106 mmol/L (ref 98–111)
Creatinine, Ser: 1.05 mg/dL (ref 0.61–1.24)
GFR, Estimated: >60 mL/min (ref >60)
Glucose, Bld: 101 mg/dL — ABNORMAL HIGH (ref 70–99)
Potassium: 4.3 mmol/L (ref 3.5–5.1)
Sodium: 140 mmol/L (ref 135–145)

## 2021-11-25 SURGERY — CARPAL TUNNEL RELEASE
Anesthesia: Monitor Anesthesia Care | Laterality: Right

## 2021-11-25 MED ORDER — FENTANYL CITRATE (PF) 100 MCG/2ML IJ SOLN: 25.0000 ug | INTRAMUSCULAR | Status: DC | PRN

## 2021-11-25 MED ORDER — CHLORHEXIDINE GLUCONATE 0.12 % MT SOLN: 15.0000 mL | Freq: Once | OROMUCOSAL | Status: AC

## 2021-11-25 MED ORDER — CEFAZOLIN SODIUM-DEXTROSE 2-4 GM/100ML-% IV SOLN
INTRAVENOUS | Status: AC
  Filled 2021-11-25: qty 100

## 2021-11-25 MED ORDER — CHLORHEXIDINE GLUCONATE 0.12 % MT SOLN
OROMUCOSAL | Status: AC
  Administered 2021-11-25: 15 mL via OROMUCOSAL
  Filled 2021-11-25: qty 15

## 2021-11-25 MED ORDER — MIDAZOLAM HCL 2 MG/2ML IJ SOLN
INTRAMUSCULAR | Status: AC
  Administered 2021-11-25: 0.5 mg via INTRAVENOUS
  Filled 2021-11-25: qty 2

## 2021-11-25 MED ORDER — FENTANYL CITRATE (PF) 100 MCG/2ML IJ SOLN: 50.0000 ug | Freq: Once | INTRAMUSCULAR | Status: AC

## 2021-11-25 MED ORDER — FENTANYL CITRATE (PF) 100 MCG/2ML IJ SOLN
INTRAMUSCULAR | Status: AC
  Administered 2021-11-25: 50 ug via INTRAVENOUS
  Filled 2021-11-25: qty 2

## 2021-11-25 MED ORDER — ORAL CARE MOUTH RINSE: 15.0000 mL | Freq: Once | OROMUCOSAL | Status: AC

## 2021-11-25 MED ORDER — AMISULPRIDE (ANTIEMETIC) 5 MG/2ML IV SOLN: 10.0000 mg | Freq: Once | INTRAVENOUS | Status: DC | PRN

## 2021-11-25 MED ORDER — EPHEDRINE SULFATE-NACL 50-0.9 MG/10ML-% IV SOSY
PREFILLED_SYRINGE | INTRAVENOUS | Status: DC | PRN
  Administered 2021-11-25 (×2): 5 mg via INTRAVENOUS

## 2021-11-25 MED ORDER — ACETAMINOPHEN 500 MG PO TABS
1000.0000 mg | ORAL_TABLET | Freq: Once | ORAL | Status: AC
  Administered 2021-11-25: 1000 mg via ORAL
  Filled 2021-11-25: qty 2

## 2021-11-25 MED ORDER — PROPOFOL 10 MG/ML IV BOLUS
INTRAVENOUS | Status: DC | PRN
  Administered 2021-11-25 (×2): 25 mg via INTRAVENOUS

## 2021-11-25 MED ORDER — PROPOFOL 500 MG/50ML IV EMUL
INTRAVENOUS | Status: DC | PRN
  Administered 2021-11-25: 150 ug/kg/min via INTRAVENOUS
  Administered 2021-11-25: 75 ug/kg/min via INTRAVENOUS

## 2021-11-25 MED ORDER — LIDOCAINE-EPINEPHRINE 1 %-1:100000 IJ SOLN
INTRAMUSCULAR | Status: AC
  Filled 2021-11-25: qty 1

## 2021-11-25 MED ORDER — MIDAZOLAM HCL 2 MG/2ML IJ SOLN: 0.5000 mg | Freq: Once | INTRAMUSCULAR | Status: AC

## 2021-11-25 MED ORDER — ONDANSETRON HCL 4 MG/2ML IJ SOLN
INTRAMUSCULAR | Status: DC | PRN
  Administered 2021-11-25: 4 mg via INTRAVENOUS

## 2021-11-25 MED ORDER — BUPIVACAINE HCL (PF) 0.25 % IJ SOLN
INTRAMUSCULAR | Status: AC
  Filled 2021-11-25: qty 30

## 2021-11-25 MED ORDER — 0.9 % SODIUM CHLORIDE (POUR BTL) OPTIME
TOPICAL | Status: DC | PRN
  Administered 2021-11-25: 1000 mL

## 2021-11-25 MED ORDER — CEFAZOLIN SODIUM-DEXTROSE 2-4 GM/100ML-% IV SOLN
2.0000 g | INTRAVENOUS | Status: AC
  Administered 2021-11-25: 2 g via INTRAVENOUS

## 2021-11-25 MED ORDER — ROPIVACAINE HCL 5 MG/ML IJ SOLN
INTRAMUSCULAR | Status: DC | PRN
  Administered 2021-11-25: 30 mL via PERINEURAL

## 2021-11-25 MED ORDER — LACTATED RINGERS IV SOLN: INTRAVENOUS | Status: DC

## 2021-11-25 MED ORDER — LIDOCAINE 2% (20 MG/ML) 5 ML SYRINGE
INTRAMUSCULAR | Status: DC | PRN
  Administered 2021-11-25: 60 mg via INTRAVENOUS

## 2021-11-25 SURGICAL SUPPLY — 37 items
BLADE SURG 15 STRL LF DISP TIS (BLADE) ×1 IMPLANT
BLADE SURG 15 STRL SS (BLADE) ×1
BNDG ELASTIC 3X5.8 VLCR STR LF (GAUZE/BANDAGES/DRESSINGS) ×1 IMPLANT
BNDG ELASTIC 4X5.8 VLCR STR LF (GAUZE/BANDAGES/DRESSINGS) IMPLANT
BNDG ESMARK 4X9 LF (GAUZE/BANDAGES/DRESSINGS) ×1 IMPLANT
BNDG GAUZE DERMACEA FLUFF 4 (GAUZE/BANDAGES/DRESSINGS) ×1 IMPLANT
CHLORAPREP W/TINT 26 (MISCELLANEOUS) ×1 IMPLANT
CORD BIPOLAR FORCEPS 12FT (ELECTRODE) ×1 IMPLANT
CUFF TOURN SGL QUICK 18X4 (TOURNIQUET CUFF) IMPLANT
CUFF TOURN SGL QUICK 24 (TOURNIQUET CUFF)
CUFF TRNQT CYL 24X4X16.5-23 (TOURNIQUET CUFF) IMPLANT
DRAPE SURG 17X23 STRL (DRAPES) ×1 IMPLANT
DRSG XEROFORM 1X8 (GAUZE/BANDAGES/DRESSINGS) IMPLANT
GAUZE 4X4 16PLY ~~LOC~~+RFID DBL (SPONGE) IMPLANT
GAUZE SPONGE 4X4 12PLY STRL (GAUZE/BANDAGES/DRESSINGS) IMPLANT
GAUZE XEROFORM 1X8 LF (GAUZE/BANDAGES/DRESSINGS) IMPLANT
GLOVE BIO SURGEON STRL SZ7 (GLOVE) ×1 IMPLANT
GLOVE BIOGEL PI IND STRL 7.0 (GLOVE) ×1 IMPLANT
GOWN STRL REUS W/ TWL LRG LVL3 (GOWN DISPOSABLE) ×1 IMPLANT
GOWN STRL REUS W/TWL LRG LVL3 (GOWN DISPOSABLE) ×1
GOWN STRL REUS W/TWL XL LVL3 (GOWN DISPOSABLE) ×1 IMPLANT
KIT BASIN OR (CUSTOM PROCEDURE TRAY) ×1 IMPLANT
NDL HYPO 25X1 1.5 SAFETY (NEEDLE) IMPLANT
NEEDLE HYPO 25X1 1.5 SAFETY (NEEDLE) IMPLANT
NS IRRIG 1000ML POUR BTL (IV SOLUTION) ×1 IMPLANT
PACK ORTHO EXTREMITY (CUSTOM PROCEDURE TRAY) ×1 IMPLANT
PAD CAST 3X4 CTTN HI CHSV (CAST SUPPLIES) ×1 IMPLANT
PADDING CAST COTTON 3X4 STRL (CAST SUPPLIES) ×1
SLEEVE SCD COMPRESS KNEE MED (STOCKING) IMPLANT
SUT ETHILON 4 0 PS 2 18 (SUTURE) ×1 IMPLANT
SUT MNCRL AB 3-0 PS2 18 (SUTURE) IMPLANT
SUT VICRYL 4-0 PS2 18IN ABS (SUTURE) IMPLANT
SYR BULB EAR ULCER 3OZ GRN STR (SYRINGE) ×1 IMPLANT
SYR CONTROL 10ML LL (SYRINGE) IMPLANT
TOWEL GREEN STERILE FF (TOWEL DISPOSABLE) ×2 IMPLANT
TUBE CONNECTING 20X1/4 (TUBING) IMPLANT
UNDERPAD 30X36 HEAVY ABSORB (UNDERPADS AND DIAPERS) ×1 IMPLANT

## 2021-11-25 NOTE — Brief Op Note (Signed)
11/25/2021  1:35 PM  PATIENT:  Jason Abbott.  76 y.o. male  PRE-OPERATIVE DIAGNOSIS:  right carpal tunnel syndrome, right flexor tenosynotvitis  POST-OPERATIVE DIAGNOSIS:  right carpal tunnel syndrome, right flexor tenosynotvitis  PROCEDURE:  Procedure(s) with comments: [place] (Right) - or MAC with regional block 60 FLEXOR TENOSYNOVECTOMY (Right) - or MAC with regional block 60  SURGEON:  Surgeon(s) and Role:    * Sherilyn Cooter, MD - Primary  PHYSICIAN ASSISTANT:   ASSISTANTS: None  ANESTHESIA:   regional and MAC  EBL:  10 mL  BLOOD ADMINISTERED:none  DRAINS: none   LOCAL MEDICATIONS USED:  NONE  SPECIMEN:  Excision  DISPOSITION OF SPECIMEN:  PATHOLOGY  COUNTS:  YES  TOURNIQUET:   Total Tourniquet Time Documented: Forearm (Right) - 59 minutes Total: Forearm (Right) - 59 minutes   DICTATION: .Viviann Spare Dictation  PLAN OF CARE: Discharge to home after PACU  PATIENT DISPOSITION:  PACU - hemodynamically stable.   Delay start of Pharmacological VTE agent (>24hrs) due to surgical blood loss or risk of bleeding: not applicable

## 2021-11-25 NOTE — Transfer of Care (Signed)
Immediate Anesthesia Transfer of Care Note  Patient: Jason Abbott.  Procedure(s) Performed: CARPAL TUNNEL RELEASE (Right) FLEXOR TENOSYNOVECTOMY (Right)  Patient Location: PACU  Anesthesia Type:MAC  Level of Consciousness: drowsy  Airway & Oxygen Therapy: Patient connected to face mask oxygen  Post-op Assessment: Report given to RN and Post -op Vital signs reviewed and stable  Post vital signs: Reviewed and stable  Last Vitals:  Vitals Value Taken Time  BP 121/76 11/25/21 1345  Temp    Pulse 75 11/25/21 1345  Resp 17 11/25/21 1345  SpO2 97 % 11/25/21 1345  Vitals shown include unvalidated device data.  Last Pain:  Vitals:   11/25/21 0842  TempSrc:   PainSc: 1          Complications: No notable events documented.

## 2021-11-25 NOTE — Anesthesia Procedure Notes (Signed)
Procedure Name: MAC Date/Time: 11/25/2021 12:02 PM  Performed by: Renato Shin, CRNAPre-anesthesia Checklist: Patient identified, Emergency Drugs available, Suction available and Patient being monitored Patient Re-evaluated:Patient Re-evaluated prior to induction Oxygen Delivery Method: Simple face mask Preoxygenation: Pre-oxygenation with 100% oxygen Induction Type: IV induction Placement Confirmation: positive ETCO2 and breath sounds checked- equal and bilateral Dental Injury: Teeth and Oropharynx as per pre-operative assessment

## 2021-11-25 NOTE — Anesthesia Procedure Notes (Signed)
Anesthesia Regional Block: Axillary brachial plexus block   Pre-Anesthetic Checklist: , timeout performed,  Correct Patient, Correct Site, Correct Laterality,  Correct Procedure, Correct Position, site marked,  Risks and benefits discussed,  Surgical consent,  Pre-op evaluation,  At surgeon's request and post-op pain management  Laterality: Right  Prep: chloraprep       Needles:  Injection technique: Single-shot  Needle Type: Echogenic Stimulator Needle     Needle Length: 9cm  Needle Gauge: 21     Additional Needles:   Procedures:, nerve stimulator,,, ultrasound used (permanent image in chart),,     Nerve Stimulator or Paresthesia:  Response: MC, Median, ulnar and radial responses elicited, 0.5 mA  Additional Responses:   Narrative:  Start time: 11/25/2021 10:22 AM End time: 11/25/2021 10:29 AM Injection made incrementally with aspirations every 5 mL.  Performed by: Personally  Anesthesiologist: Suzette Battiest, MD

## 2021-11-25 NOTE — Interval H&P Note (Signed)
History and Physical Interval Note:  11/25/2021 10:51 AM  Jason Abbott.  has presented today for surgery, with the diagnosis of right carpal tunnel syndrome, right flexor tenosynotvitis.  The various methods of treatment have been discussed with the patient and family. After consideration of risks, benefits and other options for treatment, the patient has consented to  Procedure(s) with comments: [place] (Right) - or MAC with regional block 60 FLEXOR TENOSYNOVECTOMY (Right) - or MAC with regional block 60 as a surgical intervention.  The patient's history has been reviewed, patient examined, no change in status, stable for surgery.  I have reviewed the patient's chart and labs.  Questions were answered to the patient's satisfaction.      Sharon Stapel

## 2021-11-25 NOTE — Op Note (Signed)
Date of Surgery: 11/25/2021  INDICATIONS: Patient is a 76 y.o.-year-old male with severe right volar wrist and hand pain with significant focal swelling.  This has been significantly impacting his daily activities and is keeping him awake most night.  Ultrasound exam of the wrist suggested severe flexor tenosynovitis.  Risks, benefits, and alternatives to surgery were again discussed with the patient in the preoperative area. The patient wishes to proceed with surgery.  Informed consent was signed after our discussion.   PREOPERATIVE DIAGNOSIS:  Right carpal tunnel syndrome Right flexor tenosynovitis of wrist and hand  POSTOPERATIVE DIAGNOSIS: Same.  PROCEDURE:  Right carpal tunnel release (95284) Radial flexor tenosynovectomy wrist and hand (13244)   SURGEON: Audria Nine, M.D.  ASSIST: None  ANESTHESIA:  Regional, MAC  IV FLUIDS AND URINE: See anesthesia.  ESTIMATED BLOOD LOSS: 10 mL.  IMPLANTS: * No implants in log *   DRAINS: None  COMPLICATIONS: None  DESCRIPTION OF PROCEDURE: The patient was met in the preoperative holding area where the surgical site was marked and the consent form was signed.  The patient was then taken to the operating room and transferred to the operating table.  All bony prominences were well padded.  A tourniquet was applied to the right proximal forearm.  Monitored sedation was induced.  The operative extremity was prepped and draped in the usual and sterile fashion.  A formal time-out was performed to confirm that this was the correct patient, surgery, side, and site.   Following formal timeout, the limb was gently exsanguinated with an Esmarch bandage and the tourniquet inflated 2 to 3 mmHg.  And the tourniquet inflated to 250 mmHg.  An extended carpal tunnel incision was made starting in the palm in line with the radial border of the ring finger ray then extending across the ulnar aspect of the wrist in a Bruner type fashion and continuing  proximally to the mid aspect of the distal forearm.  The skin and subcutaneous tissue were incised.  The longitudinal running fibers of the superficial palmar fascia were identified and incised.  Transverse carpal ligament identified.  The ligament was incised sharply with a 15 blade scalpel.  There was abundant protrusion of tenosynovial type tissue with a significant amount of serous fluid.  The dissection was carried out proximally across the wrist flexion crease with release of the distal antebrachial fascia of the forearm.  The median nerve was easily identified and protected.  There was abundant amount of tenosynovial tissue surrounding all the flexor tendons in the distal forearm and wrist.  This tissue was highly vascularized with abundant surrounding serous fluid.  The flexor tenosynovial tissue was sharply debrided using a tenotomy scissor taking care that the median nerve was protected at all times.  The tenosynovial tissue extended into the carpal tunnel.  The tissue was similarly debrided from all flexor tendons at the level of the carpal tunnel.  At the ulnar side of the wrist, just proximal to the flexion crease, the ulnar nerve was identified such that a complete synovectomy could performed at the ulnar aspect of the flexor tendons.  Some of this abundant tenosynovial tissue was passed off the back table to be sent for pathology.  A rongeur was used to debride some of this inflammatory tissue from the volar wrist capsule and off of the pronator quadratus fascia.   Following radical debridement of this tenosynovical tissue, the median nerve was inspected and found to be in continuity but with some flattening at the  level of the wrist and distal forearm.  The ulnar nerve was similarly inspected and found to be in continuity.  The wound was then thoroughly irrigated with copious sterile saline.  Tourniquet was deflated and hemostasis was achieved with bipolar cautery and direct pressure over the  wound.  The incision was then closed using a 4-0 nylon suture in horizontal mattress fashion.  The wound was dressed with Xeroform, folded Kerlix, cast padding, and an Ace wrap.  The hand was warm and well-perfused with brisk capillary refill at the end of the procedure.  The patient was then reversed from sedation and transferred to the postoperative bed.  All counts were correct x 2 at the end of the procedure.  The patient was taken the PACU in stable condition.  POSTOPERATIVE PLAN: He will be discharged to home with appropriate pain medication and discharge instructions.  I'll see him back in 10-14 days for his first postop visit.   Audria Nine, MD 9:49 PM

## 2021-11-25 NOTE — Discharge Instructions (Signed)
  Jason Abbott, M.D. Hand Surgery  POST-OPERATIVE DISCHARGE INSTRUCTIONS   PRESCRIPTIONS: You may have been given a prescription to be taken as directed for post-operative pain control.  You may also take over the counter ibuprofen/aleve and tylenol for pain. Take this as directed on the packaging. Do not exceed 3000 mg tylenol/acetaminophen in 24 hours.  Ibuprofen 600-800 mg (3-4) tablets by mouth every 6 hours as needed for pain.  OR Aleve 2 tablets by mouth every 12 hours (twice daily) as needed for pain.  AND/OR Tylenol 1000 mg (2 tablets) every 8 hours as needed for pain.  Please use your pain medication carefully, as refills are limited and you may not be provided with one.  As stated above, please use over the counter pain medicine - it will also be helpful with decreasing your swelling.    ANESTHESIA: After your surgery, post-surgical discomfort or pain is likely. This discomfort can last several days to a few weeks. At certain times of the day your discomfort may be more intense.   Did you receive a nerve block?  A nerve block can provide pain relief for one hour to two days after your surgery. As long as the nerve block is working, you will experience little or no sensation in the area the surgeon operated on.  As the nerve block wears off, you will begin to experience pain or discomfort. It is very important that you begin taking your prescribed pain medication before the nerve block fully wears off. Treating your pain at the first sign of the block wearing off will ensure your pain is better controlled and more tolerable when full-sensation returns. Do not wait until the pain is intolerable, as the medicine will be less effective. It is better to treat pain in advance than to try and catch up.   General Anesthesia:  If you did not receive a nerve block during your surgery, you will need to start taking your pain medication shortly after your surgery and should continue  to do so as prescribed by your surgeon.     ICE AND ELEVATION: You may use ice for the first 48-72 hours, but it is not critical.   Motion of your fingers is very important to decrease the swelling.  Elevation, as much as possible for the next 48 hours, is critical for decreasing swelling as well as for pain relief. Elevation means when you are seated or lying down, you hand should be at or above your heart. When walking, the hand needs to be at or above the level of your elbow.  If the bandage gets too tight, it may need to be loosened. Please contact our office and we will instruct you in how to do this.    SURGICAL BANDAGES:  Keep your dressing and/or splint clean and dry at all times.  Do not remove until you are seen again in the office.  If careful, you may place a plastic bag over your bandage and tape the end to shower, but be careful, do not get your bandages wet.     HAND THERAPY:  You may not need any. If you do, we will begin this at your follow up visit in the clinic.    ACTIVITY AND WORK: You are encouraged to move any fingers which are not in the bandage.  Light use of the fingers is allowed to assist the other hand with daily hygiene and eating, but strong gripping or lifting is often uncomfortable and   should be avoided.     [facility] Second Floor, [address] [place], [place] [address] [phone]  

## 2021-11-25 NOTE — H&P (Signed)
HAND SURGERY   HPI: Jason Abbott. is a 76 y.o. 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  Patient reports pain in the volar aspect of the wrist and hand that is bothersome both day and night.  He wakes multiple times per night with hand pain.  He is undergoing staged surgery for an aortic aneurysm in the near future.  It is unlikely that patient will be able to undergo carpal tunnel release and tenosynovectomy in the near future secondary to his cardiovascular surgery so he requested today's procedure be performed before his vascular surgery.  In talking with the vascular and cardiac surgery teams, this seems reasonable.  There was no additional workup needed per vascular surgery.  Patient denies any new symptoms today and denies any changes to his medical history.   Past Medical History:  Diagnosis Date   Aberrant right subclavian artery    Aortic arch aneurysm (HCC)    Arthritis    Bowel obstruction (HCC)    CLL (chronic lymphocytic leukemia) ([place])    COVID 2021   hospitalized for it   Diverticul disease small and large intestine, no perforati or abscess    GERD (gastroesophageal reflux disease)    uses tums as needed   H/O urinary infection    Pneumonia    Shingles outbreak 10/03/2013   Stroke ([place]) 01/2019   Past Surgical History:  Procedure Laterality Date   HERNIA REPAIR     MECKEL DIVERTICULUM EXCISION     ORIF FINGER / THUMB FRACTURE     ulna nerve surgery     Social History   Socioeconomic History   Marital status: Married    Spouse name: [name]   Number of children: Not on file   Years of education: Not on file   Highest education level: Some college, no degree  Occupational History    Comment: retired Corporate treasurer, Biomedical scientist, works part time  Tobacco Use   Smoking status: Former    Packs/day: 0.50    Years: 23.00    Total pack years: 11.50    Types: Cigarettes    Quit date: 1989     Years since quitting: 34.9   Smokeless tobacco: Never  Vaping Use   Vaping Use: Never used  Substance and Sexual Activity   Alcohol use: Not Currently    Comment: rarely   Drug use: No   Sexual activity: Not on file  Other Topics Concern   Not on file  Social History Narrative   lives with wife   Caffeine- coffee 1-2 daily   Social Determinants of Health   Financial Resource Strain: Low Risk  (09/16/2021)   Overall Financial Resource Strain (CARDIA)    Difficulty of Paying Living Expenses: Not hard at all  Food Insecurity: Not on file  Transportation Needs: No Transportation Needs (09/16/2021)   PRAPARE - Hydrologist (Medical): No    Lack of Transportation (Non-Medical): No  Physical Activity: Not on file  Stress: Not on file  Social Connections: Not on file   Family History  Problem Relation Age of Onset   Cancer Mother        pancreatic   COPD Father    Diabetes Sister    Diabetes Brother    Diabetes Brother    - negative except otherwise stated in the family history section Allergies  Allergen Reactions   Levaquin [Levofloxacin] Other (See Comments)  Due to medical hx   Prior to Admission medications   Medication Sig Start Date End Date Taking? Authorizing Provider  Budeson-Glycopyrrol-Formoterol (BREZTRI AEROSPHERE) 160-9-4.8 MCG/ACT AERO Inhale 2 puffs into the lungs 2 (two) times daily. 08/12/21  Yes Silverio Decamp, MD  Magnesium Citrate 200 MG TABS Take 400 mg by mouth daily.   Yes [provider]  Multiple Vitamins-Minerals (MULTIVITAMIN GUMMIES ADULTS PO) Take 2 tablets by mouth daily.   Yes [provider]  tamsulosin (FLOMAX) 0.4 MG CAPS capsule Take 0.4 mg by mouth daily. 08/20/21  Yes [provider]   No results found. - Positive ROS: All other systems have been reviewed and were otherwise negative with the exception of those mentioned in the HPI and as above.  Physical Exam: General: No  acute distress, resting comfortably Cardiovascular: BUE warm and well perfused, normal rate Respiratory: Normal WOB on RA Skin: Warm and dry Neurologic: Sensation intact distally Psychiatric: Patient is at baseline mood and affect  Right Upper Extremity  Moderate focal swelling at distal aspect of forearm and volar palm Limited AROM of fingers with full PROM Sensation and motor function limited by peripheral nerve block Hand warm and well perfused w/ BCR    Assessment: 76 yo M w/ right wrist and palm pain and swelling.  Exam and ultrasound are consistent with severe inflammatory flexor tenosynovitis.   Plan: Plan for extended carpal tunnel release with flexor tenosynovectomy today.  Reviewed risks of surgery which include bleeding, infection, damage to the median nerve or it's branches, damage to the flexor tendons, recurrence, persistent pain or carpal tunnel symptoms, need for additional surgery.  Informed consent was signed.  Patient to be discharged to home from [place], M.D. [facility] 10:46 AM

## 2021-11-25 NOTE — Anesthesia Postprocedure Evaluation (Signed)
Anesthesia Post Note  Patient: Jason Abbott.  Procedure(s) Performed: CARPAL TUNNEL RELEASE (Right) FLEXOR TENOSYNOVECTOMY (Right)     Patient location during evaluation: PACU Anesthesia Type: Regional Level of consciousness: awake and alert Pain management: pain level controlled Vital Signs Assessment: post-procedure vital signs reviewed and stable Respiratory status: spontaneous breathing, nonlabored ventilation, respiratory function stable and patient connected to nasal cannula oxygen Cardiovascular status: stable and blood pressure returned to baseline Postop Assessment: no apparent nausea or vomiting Anesthetic complications: no   No notable events documented.  Last Vitals:  Vitals:   11/25/21 1345 11/25/21 1400  BP: 121/76 108/61  Pulse: 85 62  Resp: 13 12  Temp: 36.4 C 36.4 C  SpO2: 96% 95%    Last Pain:  Vitals:   11/25/21 1400  TempSrc:   PainSc: 0-No pain                 Tiajuana Amass

## 2021-11-26 ENCOUNTER — Encounter (HOSPITAL_COMMUNITY): Payer: Self-pay | Admitting: Orthopedic Surgery

## 2021-11-26 DIAGNOSIS — G5601 Carpal tunnel syndrome, right upper limb: Secondary | ICD-10-CM | POA: Diagnosis not present

## 2021-11-26 DIAGNOSIS — M65839 Other synovitis and tenosynovitis, unspecified forearm: Secondary | ICD-10-CM | POA: Diagnosis not present

## 2021-11-27 LAB — SURGICAL PATHOLOGY

## 2021-12-11 ENCOUNTER — Encounter: Payer: Self-pay | Admitting: Surgery

## 2021-12-11 ENCOUNTER — Encounter: Payer: Self-pay | Admitting: *Deleted

## 2021-12-11 ENCOUNTER — Institutional Professional Consult (permissible substitution) (INDEPENDENT_AMBULATORY_CARE_PROVIDER_SITE_OTHER): Payer: Medicare Other | Admitting: Surgery

## 2021-12-11 ENCOUNTER — Other Ambulatory Visit: Payer: Self-pay | Admitting: *Deleted

## 2021-12-11 VITALS — BP 120/52 | HR 66 | Resp 20 | Ht 70.0 in | Wt 186.0 lb

## 2021-12-11 DIAGNOSIS — R9439 Abnormal result of other cardiovascular function study: Secondary | ICD-10-CM

## 2021-12-11 DIAGNOSIS — I7122 Aneurysm of the aortic arch, without rupture: Secondary | ICD-10-CM

## 2021-12-11 DIAGNOSIS — R7989 Other specified abnormal findings of blood chemistry: Secondary | ICD-10-CM

## 2021-12-11 DIAGNOSIS — Z5189 Encounter for other specified aftercare: Secondary | ICD-10-CM

## 2021-12-11 NOTE — Progress Notes (Signed)
Cardiothoracic Surgery Consultation  PCP is Dianah Field Gwen Her, MD Referring Provider is Serafina Mitchell, MD  Chief complaint: aortic arch aneurysm   HPI:  The patient is a 76 year old gentleman with a history of stable CLL being followed by Dr. 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  It was measured at 4.9 cm at that time.  He recently had a follow-up CT scan that showed an increase in the size of the aneurysm to 6 cm.  He has been followed by Dr. Trula Slade who recommended aortic arch reconstruction and TEVAR.  He was admitted to the hospital on 09/09/2021 with hypoxic respiratory failure and pneumonia.  He has recovered from this.  He recently underwent surgery for right carpal tunnel due to severe pain and inability to use his right hand.  He is making a good recovery from that.  He is here today with his wife.  He has retired.  He remains very active and plays accordion regularly at various gigs. Past Medical History:  Diagnosis Date   Aberrant right subclavian artery    Aortic arch aneurysm (HCC)    Arthritis    Bowel obstruction (HCC)    CLL (chronic lymphocytic leukemia) ([place])    COVID 2021   hospitalized for it   Diverticul disease small and large intestine, no perforati or abscess    GERD (gastroesophageal reflux disease)    uses tums as needed   H/O urinary infection    Pneumonia    Shingles outbreak 10/03/2013   Stroke ([place]) 01/2019    Past Surgical History:  Procedure Laterality Date   CARPAL TUNNEL RELEASE Right 11/25/2021   Procedure: CARPAL TUNNEL RELEASE;  Surgeon: Sherilyn Cooter, MD;  Location: [facility];  Service: Orthopedics;  Laterality: Right;  or MAC with regional block 60   HERNIA REPAIR     MECKEL DIVERTICULUM EXCISION     ORIF FINGER / THUMB FRACTURE     TENOSYNOVECTOMY Right  11/25/2021   Procedure: FLEXOR TENOSYNOVECTOMY;  Surgeon: Sherilyn Cooter, MD;  Location: [facility];  Service: Orthopedics;  Laterality: Right;  or MAC with regional block 60   ulna nerve surgery      Family History  Problem Relation Age of Onset   Cancer Mother        pancreatic   COPD Father    Diabetes Sister    Diabetes Brother    Diabetes Brother     Social History Social History   Tobacco Use   Smoking status: Former    Packs/day: 0.50    Years: 23.00    Total pack years: 11.50    Types: Cigarettes    Quit date: 1989    Years since quitting: 34.9   Smokeless tobacco: Never  Vaping Use   Vaping Use: Never used  Substance Use Topics   Alcohol use: Not Currently    Comment: rarely   Drug use: No    Current Outpatient Medications  Medication Sig Dispense Refill   Budeson-Glycopyrrol-Formoterol (BREZTRI AEROSPHERE) 160-9-4.8 MCG/ACT AERO Inhale 2 puffs into the lungs 2 (two) times daily. 1 g 11   Cranberry-Vitamin C-Probiotic (AZO CRANBERRY PO) Take by mouth.     Cyanocobalamin (VITAMIN B 12 PO) Take 1 tablet by mouth daily.     Magnesium Citrate 200 MG TABS Take 400 mg by mouth daily.  Multiple Vitamins-Minerals (MULTIVITAMIN GUMMIES ADULTS PO) Take 2 tablets by mouth daily.     tamsulosin (FLOMAX) 0.4 MG CAPS capsule Take 0.4 mg by mouth daily.     No current facility-administered medications for this visit.    Allergies  Allergen Reactions   Levaquin [Levofloxacin] Other (See Comments)    Due to medical hx    Review of Systems  Constitutional:  Negative for activity change and fatigue.  HENT: Negative.    Eyes: Negative.   Respiratory:  Negative for chest tightness and shortness of breath.   Cardiovascular:  Negative for chest pain and leg swelling.  Gastrointestinal: Negative.   Endocrine: Negative.   Genitourinary: Negative.   Musculoskeletal:        Leg cramps  Skin: Negative.   Allergic/Immunologic: Negative.   Neurological:  Negative for  dizziness and syncope.  Hematological: Negative.   Psychiatric/Behavioral: Negative.      BP (!) 120/52 (BP Location: Right Arm, Patient Position: Sitting)   Pulse 66   Resp 20   Ht _0  (1.778 m)   Wt 186 lb (84.4 kg)   SpO2 91% Comment: RA  BMI 26.69 kg/m  Physical Exam Constitutional:      Appearance: Normal appearance. He is normal weight.  HENT:     Mouth/Throat:     Mouth: Mucous membranes are moist.     Pharynx: Oropharynx is clear.  Eyes:     Extraocular Movements: Extraocular movements intact.     Conjunctiva/sclera: Conjunctivae normal.     Pupils: Pupils are equal, round, and reactive to light.  Neck:     Vascular: No carotid bruit.  Cardiovascular:     Rate and Rhythm: Normal rate and regular rhythm.     Pulses: Normal pulses.     Heart sounds: Normal heart sounds. No murmur heard. Pulmonary:     Effort: Pulmonary effort is normal.     Breath sounds: Normal breath sounds.  Abdominal:     General: Abdomen is flat.     Palpations: Abdomen is soft.     Tenderness: There is no abdominal tenderness.  Musculoskeletal:        General: No swelling.     Cervical back: Normal range of motion and neck supple.  Lymphadenopathy:     Cervical: No cervical adenopathy.  Skin:    General: Skin is warm and dry.  Neurological:     General: No focal deficit present.     Mental Status: He is alert and oriented to person, place, and time.  Psychiatric:        Mood and Affect: Mood normal.        Behavior: Behavior normal.      Diagnostic Tests:  ECHOCARDIOGRAM REPORT       Patient Name:   Sly Parlee. Date of Exam: 09/11/2021  Medical Rec #:  [id number]           Height:       71.0 in  Accession #:    [id number]          Weight:       178.6 lb  Date of Birth:  29-Apr-1945           BSA:          2.009 m  Patient Age:    3 years            BP:           124/86 mmHg  Patient Gender: M  HR:           76 bpm.  Exam Location:  Inpatient    Procedure: 2D Echo   Indications:    Aortic Arch Aneursym    History:        Patient has prior history of Echocardiogram examinations,  most                 recent 09/03/2021. Stroke, Signs/Symptoms:Shortness of  Breath;                 Risk Factors:Dyslipidemia.    Sonographer:    Harvie Junior  Referring Phys: [place]     1. Left ventricular ejection fraction, by estimation, is 55 to 60%. The  left ventricle has normal function. The left ventricle has no regional  wall motion abnormalities. Left ventricular diastolic parameters are  indeterminate.   2. Right ventricular systolic function is normal. The right ventricular  size is mildly enlarged. There is normal pulmonary artery systolic  pressure.   3. Right atrial size was severely dilated.   4. The mitral valve is normal in structure. No evidence of mitral valve  regurgitation. No evidence of mitral stenosis.   5. The aortic valve is tricuspid. Aortic valve regurgitation is not  visualized. Aortic valve sclerosis is present, with no evidence of aortic  valve stenosis.   6. There is moderate dilatation of the aortic root, measuring 43 mm.   7. The inferior vena cava is normal in size with greater than 50%  respiratory variability, suggesting right atrial pressure of 3 mmHg.   FINDINGS   Left Ventricle: Left ventricular ejection fraction, by estimation, is 55  to 60%. The left ventricle has normal function. The left ventricle has no  regional wall motion abnormalities. The left ventricular internal cavity  size was normal in size. There is   no left ventricular hypertrophy. Left ventricular diastolic parameters  are indeterminate.   Right Ventricle: The right ventricular size is mildly enlarged. No  increase in right ventricular wall thickness. Right ventricular systolic  function is normal. There is normal pulmonary artery systolic pressure.  The tricuspid regurgitant velocity is 2.28   m/s,  and with an assumed right atrial pressure of 3 mmHg, the estimated  right ventricular systolic pressure is 12.2 mmHg.   Left Atrium: Left atrial size was normal in size.   Right Atrium: Right atrial size was severely dilated.   Pericardium: There is no evidence of pericardial effusion. Presence of  epicardial fat layer.   Mitral Valve: The mitral valve is normal in structure. No evidence of  mitral valve regurgitation. No evidence of mitral valve stenosis.   Tricuspid Valve: The tricuspid valve is normal in structure. Tricuspid  valve regurgitation is not demonstrated. No evidence of tricuspid  stenosis.   Aortic Valve: The aortic valve is tricuspid. Aortic valve regurgitation is  not visualized. Aortic valve sclerosis is present, with no evidence of  aortic valve stenosis. Aortic valve mean gradient measures 3.5 mmHg.  Aortic valve peak gradient measures 5.6   mmHg. Aortic valve area, by VTI measures 2.92 cm.   Pulmonic Valve: The pulmonic valve was normal in structure. Pulmonic valve  regurgitation is not visualized. No evidence of pulmonic stenosis.   Aorta: There is moderate dilatation of the aortic root, measuring 43 mm.   Venous: The inferior vena cava is normal in size with greater than 50%  respiratory variability, suggesting right atrial pressure of 3 mmHg.  IAS/Shunts: No atrial level shunt detected by color flow Doppler.     LEFT VENTRICLE  PLAX 2D  LVIDd:         5.20 cm      Diastology  LVIDs:         3.60 cm      LV e' medial:    7.18 cm/s  LV PW:         1.00 cm      LV E/e' medial:  6.6  LV IVS:        0.90 cm      LV e' lateral:   10.20 cm/s  LVOT diam:     2.30 cm      LV E/e' lateral: 4.6  LV SV:         61  LV SV Index:   30  LVOT Area:     4.15 cm    LV Volumes (MOD)  LV vol d, MOD A2C: 85.4 ml  LV vol d, MOD A4C: 106.0 ml  LV vol s, MOD A2C: 36.8 ml  LV vol s, MOD A4C: 46.9 ml  LV SV MOD A2C:     48.6 ml  LV SV MOD A4C:     106.0 ml  LV SV  MOD BP:      60.6 ml   RIGHT VENTRICLE  RV Basal diam:  4.40 cm  RV Mid diam:    4.00 cm  RV S prime:     28.80 cm/s  TAPSE (M-mode): 3.0 cm   LEFT ATRIUM             Index        RIGHT ATRIUM           Index  LA diam:        3.70 cm 1.84 cm/m   RA Area:     31.80 cm  LA Vol (A2C):   48.2 ml 23.99 ml/m  RA Volume:   118.00 ml 58.72 ml/m  LA Vol (A4C):   53.6 ml 26.67 ml/m  LA Biplane Vol: 52.6 ml 26.18 ml/m   AORTIC VALVE                    PULMONIC VALVE  AV Area (Vmax):    2.67 cm     PV Vmax:          1.14 m/s  AV Area (Vmean):   2.58 cm     PV Peak grad:     5.2 mmHg  AV Area (VTI):     2.92 cm     PR End Diast Vel: 2.84 msec  AV Vmax:           118.50 cm/s  AV Vmean:          83.800 cm/s  AV VTI:            0.208 m  AV Peak Grad:      5.6 mmHg  AV Mean Grad:      3.5 mmHg  LVOT Vmax:         76.20 cm/s  LVOT Vmean:        52.100 cm/s  LVOT VTI:          0.146 m  LVOT/AV VTI ratio: 0.70    AORTA  Ao Root diam: 4.30 cm  Ao Asc diam:  3.30 cm   MITRAL VALVE               TRICUSPID  VALVE  MV Area (PHT): 6.07 cm    TR Peak grad:   20.8 mmHg  MV Decel Time: 125 msec    TR Vmax:        228.00 cm/s  MR Peak grad: 11.6 mmHg  MR Vmax:      170.00 cm/s  SHUNTS  MV E velocity: 47.10 cm/s  Systemic VTI:  0.15 m  MV A velocity: 70.80 cm/s  Systemic Diam: 2.30 cm  MV E/A ratio:  0.67   Kardie Tobb DO  Electronically signed by Berniece Salines DO  Signature Date/Time: 09/11/2021/4:02:44 PM        Final       ADDENDUM REPORT: 09/23/2021 08:10   CLINICAL DATA:  This over-read does not include interpretation of cardiac or coronary anatomy or pathology. The coronary CTA interpretation by the cardiologist is attached.   COMPARISON:  None available.   FINDINGS: No suspicious nodules, masses, or infiltrates are identified in the visualized portion of the lungs. No pleural fluid seen.   The visualized portions of the mediastinum and chest wall are unremarkable. Multiple  cysts are noted in the visualized portion of the upper liver.   IMPRESSION: No significant non-cardiac abnormality identified.     Electronically Signed   By: Marlaine Hind M.D.   On: 09/23/2021 08:10    Addended by Earle Gell, MD on 09/23/2021  8:12 AM    Study Result  Narrative & Impression  HISTORY: Thoracic aortic aneurysm, Preop   EXAM: Cardiac/Coronary  CT   TECHNIQUE: The patient was scanned on a [employer].   PROTOCOL: A 120 kV prospective scan was triggered in the descending thoracic aorta at 111 HU's. Axial non-contrast 3 mm slices were carried out through the heart. The data set was analyzed on a dedicated work station and scored using the Agatston method. Gantry rotation speed was 250 msecs and collimation was .6 mm. Beta blockade and 0.8 mg of sl NTG was given. The 3D data set was reconstructed in 5% intervals of the 35-75 % of the R-R cycle. Systolic and diastolic phases were analyzed on a dedicated work station using MPR, MIP and VRT modes. The patient received contrast: 143m OMNIPAQUE IOHEXOL 350 MG/ML SOLN.   FINDINGS: Image quality: Good   Noise artifact is: Limited   Coronary calcium score is 436, which places the patient in the 58th percentile for age and sex matched control.   LM: 5.5   LAD: 336   LCx: 0   RCA: 94.6   Coronary arteries: Normal coronary origins.  Right dominance.   Right Coronary Artery: Minimal tubular atherosclerotic plaque in the proximal RCA, <25% stenosis. The RCA then becomes ectatic, measuring 6.7 cm. Minimal mixed atherosclerotic plaque in the mid and distal RCA, <25% stenosis. Patent PDA, PLA.   Left Main Coronary Artery: Minimal atherosclerotic plaque in the short segment LCMA, <25% stenosis.   Left Anterior Descending Coronary Artery: Mild mixed atherosclerotic plaque in the proximal LAD, 25-49% stenosis, low attenuation plaque with positive remodeling. Minimal mixed atherosclerotic plaque  in the mid LAD, <25% stenosis followed by mild mixed atherosclerotic plaque 25-49% stenosis. Diffusely diseased first diagonal branch. Proximal D1 mild stenosis (25-49% stenoisis), and in the mid portion of the small caliber first diagonal there is a probable moderate stenosis with calcified plaque, at least 50-69% stenosis. This vessel is <253mproximal to the stenosis.   Left Circumflex Artery: No detectable plaque or stenosis.   Aorta: Dilated thoracic aorta, not fully evaluated on  this exam. The sinus of Valsalva and mid ascending aorta are mildly dilated at 41 mm and 40 mm respectively, measured double oblique. Mild aortic root and descending aorta calcifications.   Aortic Valve: Trivial calcifications.   Other findings:   Normal variant pulmonary vein drainage into the left atrium, common antrum of left sided pulmonary veins.   Normal left atrial appendage without thrombus.   Mild dilation of main pulmonary artery.   Please see separate report from [facility] for non-cardiac findings.   IMPRESSION: 1. 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 Low attenuation plaque with positive remodeling in proximal LAD, CADRADS 2V.   2. Probable moderate mixed plaque in the small caliber mid first diagonal branch, 50-69% stenosis, with proximal vessel lumen <2 mm. Due to vessel size, CT FFR will not be performed.   3. Coronary calcium score is 436, which places the patient in the 58th percentile for age and sex matched control.   4. Normal coronary origins with right dominance.   5. Dilated thoracic aorta not fully evaluated on this exam. Mild dilation of sinus of Valsalva (41 mm) and mid ascending aorta (40 mm).   RECOMMENDATIONS: CAD-RADS 2. Mild non-obstructive CAD (25-49%). Consider non-atherosclerotic causes of chest pain. Consider preventive therapy and risk factor modification. "V" modifier  indicates vulnerable plaque characteristics, >/=2 in one lesions (positive remodeling, low attenuation plaque).   Electronically Signed: By: Cherlynn Kaiser M.D. On: 09/22/2021 13:45      Narrative & Impression CLINICAL DATA:  Thoracic aortic aneurysm.   EXAM: CT ANGIOGRAPHY CHEST WITH CONTRAST   TECHNIQUE: Multidetector CT imaging of the chest was performed using the standard protocol during bolus administration of intravenous contrast. Multiplanar CT image reconstructions and MIPs were obtained to evaluate the vascular anatomy.   RADIATION DOSE REDUCTION: This exam was performed according to the departmental dose-optimization program which includes automated exposure control, adjustment of the mA and/or kV according to patient size and/or use of iterative reconstruction technique.   CONTRAST:  138m OMNIPAQUE IOHEXOL 350 MG/ML SOLN   COMPARISON:  July 20, 2019.   FINDINGS: Cardiovascular: Atherosclerosis of thoracic aorta is noted without dissection. There is been significant enlargement of saccular aneurysm arising from the inferior aspect of the transverse aortic arch measuring 6.0 by 4.6 cm. Normal cardiac size. No pericardial effusion. Coronary artery calcifications are noted. 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   Mediastinum/Nodes: No enlarged mediastinal, hilar, or axillary lymph nodes. Thyroid gland, trachea, and esophagus demonstrate no significant findings.   Lungs/Pleura: No pneumothorax or pleural effusion is noted. Minimal biapical scarring is noted. Mild bibasilar subsegmental atelectasis is noted.   Upper Abdomen: Stable hepatic cysts.   Musculoskeletal: No chest wall abnormality. No acute or significant osseous findings.   Review of the MIP images confirms the above findings.   IMPRESSION: 6.0 x 4.6 cm saccular aneurysm is seen arising from inferior aspect of the transverse aortic arch which is significantly  enlarged compared to prior exam. Cardiothoracic surgery consultation recommended due to increased risk of rupture for arch aneurysm ? 5.5 cm. This recommendation follows 2010 ACCF/AHA/AATS/ACR/ASA/SCA/SCAI/SIR/STS/SVM Guidelines for the Diagnosis and Management of Patients With Thoracic Aortic Disease. Circulation. 2010; 121:: [id number] Aortic aneurysm NOS (ICD10-I71.9). These results will be called to the ordering clinician or representative by the Radiologist Assistant, and communication documented in the PACS or zVision Dashboard.   Coronary artery calcifications are noted.   Aberrant right subclavian artery  is noted which is congenital anomaly.   Minimal biapical scarring is noted. Mild bibasilar subsegmental atelectasis is noted.   Aortic Atherosclerosis (ICD10-I70.0).     Electronically Signed   By: Marijo Conception M.D.   On: 08/19/2021 12:10   Impression:  This 76 year old gentleman has an enlarging 6 cm saccular aneurysm arising from the inferior aortic arch with an aberrant right subclavian artery arising from the distal aortic arch.  I agree that the best treatment for him is aortic arch de-branching with stent grafting across the aortic arch to exclude this aneurysm.  He will require right carotid-subclavian transposition or bypass preoperatively and Dr. Trula Slade has this scheduled in the next few weeks.  We would then proceed with the remainder of his surgery in early January. I discussed the operative procedure with the patient and his including alternatives, benefits and risks; including but not limited to bleeding, blood transfusion, infection, stroke, stent graft failure, paralysis or paresis of the lower extremities, organ dysfunction, and death.  Boykin Nearing. understands and agrees to proceed.    Plan:  He will undergo right carotid-subclavian bypass or transposition in the next few weeks.  He will be scheduled for aortic arch de-branching and TEVAR in early  January 2024.  I spent 45 minutes performing this consultation and > 50% of this time was spent face to face counseling and coordinating the care of this patient's aortic arch aneurysm with a apparent right subclavian artery.   [name], MD [facility] [phone]

## 2021-12-11 NOTE — Progress Notes (Signed)
Surgical Instructions    Your procedure is scheduled on Wednesday, 12/18/21.  Report to [facility] Main Entrance "A" at 6:30 A.M., then check in with the Admitting office.  Call this number if you have problems the morning of surgery:  [phone]   If you have any questions prior to your surgery date call [phone]: Open Monday-Friday 8am-4pm If you experience any cold or flu symptoms such as cough, fever, chills, shortness of breath, etc. between now and your scheduled surgery, please notify us at the above number     Remember:  Do not eat or drink after midnight the night before your surgery     Take these medicines the morning of surgery with A SIP OF WATER:  Budeson-Glycopyrrol-Formoterol (BREZTRI AEROSPHERE)  tamsulosin (FLOMAX)   As of today, STOP taking any Aspirin (unless otherwise instructed by your surgeon) Aleve, Naproxen, Ibuprofen, Motrin, Advil, Goody's, BC's, all herbal medications, fish oil, and all vitamins.           Do not wear jewelry or makeup. Do not wear lotions, powders, cologne or deodorant. Men may shave face and neck. Do not bring valuables to the hospital. Do not wear nail polish, gel polish, artificial nails, or any other type of covering on natural nails (fingers and toes) If you have artificial nails or gel coating that need to be removed by a nail salon, please have this removed prior to surgery. Artificial nails or gel coating may interfere with anesthesia's ability to adequately monitor your vital signs.  [facility] is not responsible for any belongings or valuables.    Do NOT Smoke (Tobacco/Vaping)  24 hours prior to your procedure  If you use a CPAP at night, you may bring your mask for your overnight stay.   Contacts, glasses, hearing aids, dentures or partials may not be worn into surgery, please bring cases for these belongings   For patients admitted to the hospital, discharge time will be determined by your treatment team.    Patients discharged the day of surgery will not be allowed to drive home, and someone needs to stay with them for 24 hours.   SURGICAL WAITING ROOM VISITATION Patients having surgery or a procedure may have no more than 2 support people in the waiting area - these visitors may rotate.   Children under the age of 67 must have an adult with them who is not the patient. If the patient needs to stay at the hospital during part of their recovery, the visitor guidelines for inpatient rooms apply. Pre-op nurse will coordinate an appropriate time for 1 support person to accompany patient in pre-op.  This support person may not rotate.   Please refer to [email or web address] for the visitor guidelines for Inpatients (after your surgery is over and you are in a regular room).    Special instructions:    Oral Hygiene is also important to reduce your risk of infection.  Remember - BRUSH YOUR TEETH THE MORNING OF SURGERY WITH YOUR REGULAR TOOTHPASTE   [place]- Preparing For Surgery  Before surgery, you can play an important role. Because skin is not sterile, your skin needs to be as free of germs as possible. You can reduce the number of germs on your skin by washing with CHG (chlorahexidine gluconate) Soap before surgery.  CHG is an antiseptic cleaner which kills germs and bonds with the skin to continue killing germs even after washing.     Please do not use if you have an allergy  to CHG or antibacterial soaps. If your skin becomes reddened/irritated stop using the CHG.  Do not shave (including legs and underarms) for at least 48 hours prior to first CHG shower. It is OK to shave your face.  Please follow these instructions carefully.     Shower the NIGHT BEFORE SURGERY and the MORNING OF SURGERY with CHG Soap.   If you chose to wash your hair, wash your hair first as usual with your normal shampoo. After you shampoo, rinse your hair and body  thoroughly to remove the shampoo.  Then [employer] and genitals (private parts) with your normal soap and rinse thoroughly to remove soap.  After that Use CHG Soap as you would any other liquid soap. You can apply CHG directly to the skin and wash gently with a scrungie or a clean washcloth.   Apply the CHG Soap to your body ONLY FROM THE NECK DOWN.  Do not use on open wounds or open sores. Avoid contact with your eyes, ears, mouth and genitals (private parts). Wash Face and genitals (private parts)  with your normal soap.   Wash thoroughly, paying special attention to the area where your surgery will be performed.  Thoroughly rinse your body with warm water from the neck down.  DO NOT shower/wash with your normal soap after using and rinsing off the CHG Soap.  Pat yourself dry with a CLEAN TOWEL.  Wear CLEAN PAJAMAS to bed the night before surgery  Place CLEAN SHEETS on your bed the night before your surgery  DO NOT SLEEP WITH PETS.   Day of Surgery: Take a shower with CHG soap. Wear Clean/Comfortable clothing the morning of surgery Do not apply any deodorants/lotions.   Remember to brush your teeth WITH YOUR REGULAR TOOTHPASTE.    If you received a COVID test during your pre-op visit, it is requested that you wear a mask when out in public, stay away from anyone that may not be feeling well, and notify your surgeon if you develop symptoms. If you have been in contact with anyone that has tested positive in the last 10 days, please notify your surgeon.    Please read over the following fact sheets that you were given.

## 2021-12-12 ENCOUNTER — Encounter (HOSPITAL_COMMUNITY): Payer: Self-pay

## 2021-12-12 ENCOUNTER — Other Ambulatory Visit: Payer: Self-pay

## 2021-12-12 ENCOUNTER — Encounter (HOSPITAL_COMMUNITY)
Admission: RE | Admit: 2021-12-12 | Discharge: 2021-12-12 | Disposition: A | Discharge status: Home / Self Care | Payer: Medicare Other | Source: Ambulatory Visit | Attending: Surgery | Admitting: Surgery

## 2021-12-12 VITALS — BP 122/75 | HR 67 | Temp 98.1°F | Resp 18 | Ht 70.0 in | Wt 190.3 lb

## 2021-12-12 DIAGNOSIS — Z01812 Encounter for preprocedural laboratory examination: Secondary | ICD-10-CM | POA: Insufficient documentation

## 2021-12-12 DIAGNOSIS — Z5189 Encounter for other specified aftercare: Secondary | ICD-10-CM | POA: Diagnosis not present

## 2021-12-12 DIAGNOSIS — I7122 Aneurysm of the aortic arch, without rupture: Secondary | ICD-10-CM | POA: Insufficient documentation

## 2021-12-12 DIAGNOSIS — Z01818 Encounter for other preprocedural examination: Secondary | ICD-10-CM

## 2021-12-12 LAB — SURGICAL PCR SCREEN
MRSA, PCR: NEGATIVE
Staphylococcus aureus: NEGATIVE

## 2021-12-12 LAB — APTT: aPTT: 27 seconds (ref 24–36)

## 2021-12-12 LAB — COMPREHENSIVE METABOLIC PANEL
ALT: 17 U/L (ref 0–44)
AST: 23 U/L (ref 15–41)
Albumin: 4.1 g/dL (ref 3.5–5.0)
Alkaline Phosphatase: 74 U/L (ref 38–126)
Anion gap: 6 (ref 5–15)
BUN: 12 mg/dL (ref 8–23)
CO2: 27 mmol/L (ref 22–32)
Calcium: 9.6 mg/dL (ref 8.9–10.3)
Chloride: 106 mmol/L (ref 98–111)
Creatinine, Ser: 1.04 mg/dL (ref 0.61–1.24)
GFR, Estimated: >60 mL/min (ref >60)
Glucose, Bld: 96 mg/dL (ref 70–99)
Potassium: 4.4 mmol/L (ref 3.5–5.1)
Sodium: 139 mmol/L (ref 135–145)
Total Bilirubin: 0.8 mg/dL (ref 0.3–1.2)
Total Protein: 6.4 g/dL — ABNORMAL LOW (ref 6.5–8.1)

## 2021-12-12 LAB — URINALYSIS, ROUTINE W REFLEX MICROSCOPIC
Bilirubin Urine: NEGATIVE
Glucose, UA: NEGATIVE mg/dL
Hgb urine dipstick: NEGATIVE
Ketones, ur: NEGATIVE mg/dL
Leukocytes,Ua: NEGATIVE
Nitrite: NEGATIVE
Protein, ur: NEGATIVE mg/dL
Specific Gravity, Urine: 1.015 (ref 1.005–1.030)
pH: 6 (ref 5.0–8.0)

## 2021-12-12 LAB — CBC
HCT: 42.3 % (ref 39.0–52.0)
Hemoglobin: 13.5 g/dL (ref 13.0–17.0)
MCH: 31.8 pg (ref 26.0–34.0)
MCHC: 31.9 g/dL (ref 30.0–36.0)
MCV: 99.8 fL (ref 80.0–100.0)
Platelets: 144 10*3/uL — ABNORMAL LOW (ref 150–400)
RBC: 4.24 MIL/uL (ref 4.22–5.81)
RDW: 13.8 % (ref 11.5–15.5)
WBC: 35.2 10*3/uL — ABNORMAL HIGH (ref 4.0–10.5)
nRBC: 0 % (ref 0.0–0.2)

## 2021-12-12 LAB — PROTIME-INR
INR: 1 (ref 0.8–1.2)
Prothrombin Time: 13.2 seconds (ref 11.4–15.2)

## 2021-12-12 NOTE — Progress Notes (Addendum)
PCP - Dr.Thomas Thekkekandam  Cardiologist - pt denies  PPM/ICD - pt denies Device Orders - n/a Rep Notified - n/a  Chest x-ray - 10/17/21 EKG - 09/09/21 Stress Test - pt unsure ECHO - 09/16/21 Cardiac Cath - pt denies  Sleep Study - pt denies CPAP - n/a  Fasting Blood Sugar - pt denies Diabetes  Checks Blood Sugar _____ times a day  Last dose of GLP1 agonist-  pt denies GLP1 instructions: pt denies  Blood Thinner Instructions:pt denies Aspirin Instructions:pt denies  ERAS Protcol -npo after midnight  PRE-SURGERY Ensure or G2- n/a  COVID TEST- n/a   Anesthesia review: YES, recent admission on 09/22/21 for pneumonia. Pt had carpal tunnel surgery on 11/25/21.   Patient denies shortness of breath, fever, cough and chest pain at PAT appointment   All instructions explained to the patient, with a verbal understanding of the material. Patient agrees to go over the instructions while at home for a better understanding. Patient also instructed to self quarantine after being tested for COVID-19. The opportunity to ask questions was provided.

## 2021-12-13 ENCOUNTER — Encounter (HOSPITAL_COMMUNITY): Payer: Self-pay | Admitting: *Deleted

## 2021-12-13 NOTE — Progress Notes (Signed)
Call from blood bank. + antibody screen for patient. Note placed on patient's chart for the need for new T&S DOS.

## 2021-12-13 NOTE — Progress Notes (Signed)
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  He is s/p right carpal tunnel release and right radial flexor tenosynovectomy 11/25/21 by Dr. Sherilyn Cooter. He was having a significant amount right hand symptoms (plays the accordion), so that surgery was planned prior to him undergoing right carotid subclavian surgery (scheduled for 12/18/21) followed by sternotomy and full arch to branching with TEVAR (scheduled with Dr. Gilford Raid 01/10/22).    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   Last pulmonology visit was with Dr. Valeta Harms on 10/09/21 for post pneumonia Hospital follow-up.  Patient was feeling much better. He been using Breztri twice a day and as needed albuterol.  PFT showed mild stage I COPD. He wrote, "Has recovered from his recent community-acquired pneumonia while he was in the hospital.  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 Last office visit was on 08/26/21. CLL stable (WBC 25.6K then), so one year follow-up recommended; However, Dr. Marin Olp also evaluated him on 09/12/21 during admission for CAP.  At that time he did not think that patient CLL was playing a major factor in respect to his  pneumonia and did not recommend IVIG at that time. His WBC range has been ~ 20K-33K since 09/2020. WBC 35.3K on 12/12/21. PLT 144, primarily 90-130K since 09/2020, but as low as 77K on 09/10/21 (in setting of PNA).   Anesthesia team to evaluate on the day of surgery.    VS: BP 122/75   Pulse 67   Temp 36.7 C (Oral)   Resp 18   Ht _0  (1.778 m)   Wt 86.3 kg   SpO2 100%   BMI 27.31 kg/m    PROVIDERS: Silverio Decamp, MD is PCP  June Leap, DO is pulmonologist Gilford Raid, MD is CT surgeon, previously Ivin Poot, MD Serafina Mitchell, MD is vascular surgeon Burney Gauze, MD is HEM-ONC   LABS: Preoperative labs noted. See DISCUSSION. (all labs ordered are listed, but only abnormal results are displayed)  Labs Reviewed  CBC - Abnormal; Notable for the following components:      Result Value   WBC 35.2 (*)    Platelets 144 (*)    All other components within normal limits  COMPREHENSIVE METABOLIC PANEL - Abnormal; Notable for the following components:   Total Protein 6.4 (*)    All other components within normal limits  SURGICAL PCR SCREEN  PROTIME-INR  APTT  URINALYSIS, ROUTINE W REFLEX MICROSCOPIC  TYPE AND SCREEN     OTHER: PFTs 10/07/21: FVC 4.73 (107%), post 4.54 (103%). FEV1 3.28 (103%), post 3.02 (94%). DLCO unc 14.33 (55%), cor 14.72 (56%).     IMAGES: CXR 10/17/21: IMPRESSION: Stable saccular aneurysm along the inferior aspect of the aortic arch.  MRI Brain 09/28/21: IMPRESSION: No evidence of acute intracranial abnormality. Mild chronic small vessel ischemic changes within the cerebral white matter.   CTA Chest 09/09/21: IMPRESSION: 1. No CT evidence of central acute pulmonary artery embolus. 2. Left-sided aortic arch with aberrant right subclavian artery anatomy. No periaortic fluid collection. 3. 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 Follow-up as per recommendation of the prior CT  with cardiothoracic surgery consult. 4. Aortic Atherosclerosis (ICD10-I70.0) and Emphysema (ICD10-J43.9).   CTA Chest 08/19/21: IMPRESSION: - 6.0 x 4.6 cm saccular aneurysm is seen arising from inferior aspect of the transverse aortic arch which is significantly enlarged compared to prior exam. Cardiothoracic surgery consultation recommended due to increased risk of rupture for arch aneurysm ? 5.5 cm. This recommendation follows 2010 ACCF/AHA/AATS/ACR/ASA/SCA/SCAI/SIR/STS/SVM Guidelines for the Diagnosis and Management of Patients With Thoracic Aortic Disease. Circulation. 2010; 121: A213-Y865. Aortic aneurysm NOS (ICD10-I71.9). These results will be called to the ordering clinician or representative by the Radiologist Assistant, and communication documented in the PACS or zVision Dashboard. - Coronary artery calcifications are noted. 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 - Minimal biapical scarring is noted. Mild bibasilar subsegmental atelectasis is noted. - Aortic Atherosclerosis (ICD10-I70.0).     EKG: 09/09/21: Sinus rhythm Atrial premature complex Right bundle branch block Inferior infarct, old No old tracing to compare Confirmed by Sherwood Gambler [id number]) on 09/09/2021 10:35:12 PM     CV: CT Coronary 09/20/21: IMPRESSION: 1. 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 Low attenuation plaque with positive remodeling in proximal LAD, CADRADS 2V. 2. Probable moderate mixed plaque in the small caliber mid first diagonal branch, 50-69% stenosis, with proximal vessel lumen <2 mm. Due to vessel size, CT FFR will not be performed. 3. Coronary calcium score is 436, which places the patient in the 58th percentile for age and sex matched control. 4. Normal coronary origins with right dominance. 5. Dilated thoracic aorta not fully evaluated on this exam. Mild dilation of sinus of Valsalva (41 mm) and  mid ascending aorta (40 mm). RECOMMENDATIONS: CAD-RADS 2. Mild non-obstructive CAD (25-49%). Consider non-atherosclerotic causes of chest pain. Consider preventive therapy and risk factor modification. "V" modifier indicates vulnerable plaque characteristics, >/=2 in one lesions (positive remodeling, low attenuation plaque).     Echo 09/16/21: IMPRESSIONS   1. Left ventricular ejection fraction, by estimation, is 55 to 60%. The  left ventricle has normal function. The left ventricle has no regional  wall motion abnormalities. Left ventricular diastolic parameters are  indeterminate.   2. Right ventricular systolic function is normal. The right ventricular  size is mildly enlarged. There is normal pulmonary artery systolic  pressure.   3. Right atrial size was severely dilated.   4. The mitral valve is normal in structure. No evidence of mitral valve  regurgitation. No evidence of mitral stenosis.   5. The aortic valve is tricuspid. Aortic valve regurgitation is not  visualized. Aortic valve sclerosis is present, with no evidence of aortic  valve stenosis.   6. There is moderate dilatation of the aortic root, measuring 43 mm.   7. The inferior vena cava is normal in size with greater than 50%  respiratory variability, suggesting right atrial pressure of 3 mmHg.      US Carotid 09/12/21: Summary:  - Right Carotid: The extracranial vessels were near-normal with only minimal wall thickening or plaque.  - Left Carotid: The  extracranial vessels were near-normal with only minimal wall thickening or plaque.  - Vertebrals:  Bilateral vertebral arteries demonstrate antegrade flow.  - Subclavians: Normal flow hemodynamics were seen in bilateral subclavian arteries.    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    GERD (gastroesophageal reflux disease)    uses tums as needed   H/O urinary infection    Pneumonia 09/22/2021   Shingles outbreak 10/03/2013   Stroke ([place]) 01/2019    Past Surgical History:  Procedure Laterality Date   CARPAL TUNNEL RELEASE Right 11/25/2021   Procedure: CARPAL TUNNEL RELEASE;  Surgeon: Sherilyn Cooter, MD;  Location: [place];  Service: Orthopedics;  Laterality: Right;  or MAC with regional block 60   HERNIA REPAIR     MECKEL DIVERTICULUM EXCISION     ORIF FINGER / THUMB FRACTURE     TENOSYNOVECTOMY Right 11/25/2021   Procedure: FLEXOR TENOSYNOVECTOMY;  Surgeon: Sherilyn Cooter, MD;  Location: [place];  Service: Orthopedics;  Laterality: Right;  or MAC with regional block 60   ulna nerve surgery      MEDICATIONS:  Budeson-Glycopyrrol-Formoterol (BREZTRI AEROSPHERE) 160-9-4.8 MCG/ACT AERO   Cranberry-Vitamin C-Probiotic (AZO CRANBERRY PO)   Cyanocobalamin (VITAMIN B 12 PO)   ibuprofen (ADVIL) 200 MG tablet   Multiple Vitamins-Minerals (MULTIVITAMIN GUMMIES ADULTS PO)   POTASSIUM CITRATE PO   tamsulosin (FLOMAX) 0.4 MG CAPS capsule   No current facility-administered medications for this encounter.    Myra Gianotti, PA-C Surgical Short Stay/Anesthesiology [facility] Phone [phone] [facility] Phone [phone] 12/13/2021 6:40 PM

## 2021-12-17 ENCOUNTER — Ambulatory Visit: Payer: Medicare Other | Admitting: Physical Therapy

## 2021-12-17 NOTE — Anesthesia Preprocedure Evaluation (Addendum)
Anesthesia Evaluation  Patient identified by MRN, date of birth, ID band Patient awake    Reviewed: Allergy & Precautions, NPO status , Patient's Chart, lab work & pertinent test results  Airway Mallampati: II  TM Distance: >3 FB Neck ROM: Full    Dental no notable dental hx.    Pulmonary former smoker   Pulmonary exam normal        Cardiovascular  Rhythm:Regular Rate:Normal  Aortic arch aneurysm  ECHO 2023  1. Left ventricular ejection fraction, by estimation, is 55 to 60%. The  left ventricle has normal function. The left ventricle has no regional  wall motion abnormalities. Left ventricular diastolic parameters are  indeterminate.   2. Right ventricular systolic function is normal. The right ventricular  size is mildly enlarged. There is normal pulmonary artery systolic  pressure.   3. Right atrial size was severely dilated.   4. The mitral valve is normal in structure. No evidence of mitral valve  regurgitation. No evidence of mitral stenosis.   5. The aortic valve is tricuspid. Aortic valve regurgitation is not  visualized. Aortic valve sclerosis is present, with no evidence of aortic  valve stenosis.   6. There is moderate dilatation of the aortic root, measuring 43 mm.   7. The inferior vena cava is normal in size with greater than 50%  respiratory variability, suggesting right atrial pressure of 3 mmHg.      Neuro/Psych  Headaches CVA (01/21)  negative psych ROS   GI/Hepatic Neg liver ROS,GERD  ,,  Endo/Other  negative endocrine ROS    Renal/GU negative Renal ROS  negative genitourinary   Musculoskeletal  (+) Arthritis , Osteoarthritis,    Abdominal Normal abdominal exam  (+)   Peds  Hematology negative hematology ROS (+)   Anesthesia Other Findings   Reproductive/Obstetrics                             Anesthesia Physical Anesthesia Plan  ASA: 3  Anesthesia Plan:  General   Post-op Pain Management:    Induction: Intravenous  PONV Risk Score and Plan: 2 and Ondansetron, Dexamethasone and Treatment may vary due to age or medical condition  Airway Management Planned: Mask and Oral ETT  Additional Equipment: Arterial line  Intra-op Plan:   Post-operative Plan: Extubation in OR  Informed Consent: I have reviewed the patients History and Physical, chart, labs and discussed the procedure including the risks, benefits and alternatives for the proposed anesthesia with the patient or authorized representative who has indicated his/her understanding and acceptance.     Dental advisory given  Plan Discussed with: CRNA  Anesthesia Plan Comments: (Lab Results      Component                Value               Date                      WBC                      35.2 (H)            12/12/2021                HGB                      13.5  12/12/2021                HCT                      42.3                12/12/2021                MCV                      99.8                12/12/2021                PLT                      144 (L)             12/12/2021             Lab Results      Component                Value               Date                      NA                       139                 12/12/2021                K                        4.4                 12/12/2021                CO2                      27                  12/12/2021                GLUCOSE                  96                  12/12/2021                BUN                      12                  12/12/2021                CREATININE               1.04                12/12/2021                CALCIUM                  9.6                 12/12/2021  EGFR                     75                  08/15/2021                GFRNONAA                 >60                 12/12/2021           )       Anesthesia Quick Evaluation

## 2021-12-18 ENCOUNTER — Encounter (HOSPITAL_COMMUNITY): Payer: Self-pay | Admitting: Surgery

## 2021-12-18 ENCOUNTER — Encounter (HOSPITAL_COMMUNITY)
Admission: RE | Disposition: A | Discharge status: Home / Self Care | Payer: Self-pay | Source: Home / Self Care | Attending: Surgery

## 2021-12-18 ENCOUNTER — Other Ambulatory Visit: Payer: Self-pay

## 2021-12-18 ENCOUNTER — Inpatient Hospital Stay (HOSPITAL_COMMUNITY): Payer: Medicare Other | Admitting: Vascular Surgery

## 2021-12-18 ENCOUNTER — Inpatient Hospital Stay (HOSPITAL_COMMUNITY)
Admission: RE | Admit: 2021-12-18 | Discharge: 2021-12-19 | DRG: 254 | Disposition: A | Discharge status: Home / Self Care | Payer: Medicare Other | Attending: Surgery | Admitting: Surgery

## 2021-12-18 ENCOUNTER — Inpatient Hospital Stay (HOSPITAL_COMMUNITY): Payer: Medicare Other | Admitting: Certified Registered"

## 2021-12-18 DIAGNOSIS — Z87891 Personal history of nicotine dependence: Secondary | ICD-10-CM | POA: Diagnosis not present

## 2021-12-18 DIAGNOSIS — Z856 Personal history of leukemia: Secondary | ICD-10-CM | POA: Diagnosis not present

## 2021-12-18 DIAGNOSIS — I7122 Aneurysm of the aortic arch, without rupture: Secondary | ICD-10-CM | POA: Diagnosis present

## 2021-12-18 DIAGNOSIS — D696 Thrombocytopenia, unspecified: Secondary | ICD-10-CM | POA: Diagnosis present

## 2021-12-18 DIAGNOSIS — M1990 Unspecified osteoarthritis, unspecified site: Secondary | ICD-10-CM

## 2021-12-18 DIAGNOSIS — Q278 Other specified congenital malformations of peripheral vascular system: Secondary | ICD-10-CM | POA: Diagnosis not present

## 2021-12-18 DIAGNOSIS — Z79899 Other long term (current) drug therapy: Secondary | ICD-10-CM

## 2021-12-18 DIAGNOSIS — K219 Gastro-esophageal reflux disease without esophagitis: Secondary | ICD-10-CM | POA: Diagnosis present

## 2021-12-18 DIAGNOSIS — Z833 Family history of diabetes mellitus: Secondary | ICD-10-CM | POA: Diagnosis not present

## 2021-12-18 DIAGNOSIS — E782 Mixed hyperlipidemia: Secondary | ICD-10-CM | POA: Diagnosis present

## 2021-12-18 DIAGNOSIS — Z8673 Personal history of transient ischemic attack (TIA), and cerebral infarction without residual deficits: Secondary | ICD-10-CM | POA: Diagnosis not present

## 2021-12-18 DIAGNOSIS — Z888 Allergy status to other drugs, medicaments and biological substances status: Secondary | ICD-10-CM | POA: Diagnosis not present

## 2021-12-18 DIAGNOSIS — Z825 Family history of asthma and other chronic lower respiratory diseases: Secondary | ICD-10-CM

## 2021-12-18 DIAGNOSIS — Z8616 Personal history of COVID-19: Secondary | ICD-10-CM

## 2021-12-18 DIAGNOSIS — I712 Thoracic aortic aneurysm, without rupture, unspecified: Secondary | ICD-10-CM | POA: Diagnosis not present

## 2021-12-18 HISTORY — PX: CAROTID-SUBCLAVIAN BYPASS GRAFT: SHX910

## 2021-12-18 LAB — TYPE AND SCREEN
ABO/RH(D): O POS
Antibody Screen: POSITIVE

## 2021-12-18 LAB — POCT ACTIVATED CLOTTING TIME: Activated Clotting Time: 228 seconds

## 2021-12-18 SURGERY — CREATION, BYPASS, ARTERIAL, SUBCLAVIAN TO CAROTID, USING GRAFT
Anesthesia: General | Laterality: Right

## 2021-12-18 MED ORDER — PROPOFOL 10 MG/ML IV BOLUS
INTRAVENOUS | Status: AC
  Filled 2021-12-18: qty 20

## 2021-12-18 MED ORDER — UMECLIDINIUM BROMIDE 62.5 MCG/ACT IN AEPB
1.0000 | INHALATION_SPRAY | Freq: Every day | RESPIRATORY_TRACT | Status: DC
  Administered 2021-12-19: 1 via RESPIRATORY_TRACT
  Filled 2021-12-18: qty 7

## 2021-12-18 MED ORDER — LIDOCAINE 2% (20 MG/ML) 5 ML SYRINGE
INTRAMUSCULAR | Status: AC
  Filled 2021-12-18: qty 5

## 2021-12-18 MED ORDER — BUDESON-GLYCOPYRROL-FORMOTEROL 160-9-4.8 MCG/ACT IN AERO: 2.0000 | INHALATION_SPRAY | Freq: Every day | RESPIRATORY_TRACT | Status: DC

## 2021-12-18 MED ORDER — ONDANSETRON HCL 4 MG/2ML IJ SOLN
INTRAMUSCULAR | Status: DC | PRN
  Administered 2021-12-18: 4 mg via INTRAVENOUS

## 2021-12-18 MED ORDER — FENTANYL CITRATE (PF) 250 MCG/5ML IJ SOLN
INTRAMUSCULAR | Status: AC
  Filled 2021-12-18: qty 5

## 2021-12-18 MED ORDER — ONDANSETRON HCL 4 MG/2ML IJ SOLN: 4.0000 mg | Freq: Four times a day (QID) | INTRAMUSCULAR | Status: DC | PRN

## 2021-12-18 MED ORDER — HYDRALAZINE HCL 20 MG/ML IJ SOLN: 5.0000 mg | INTRAMUSCULAR | Status: DC | PRN

## 2021-12-18 MED ORDER — ACETAMINOPHEN 325 MG PO TABS
325.0000 mg | ORAL_TABLET | ORAL | Status: DC | PRN
  Administered 2021-12-19: 650 mg via ORAL
  Filled 2021-12-18: qty 2

## 2021-12-18 MED ORDER — BISACODYL 10 MG RE SUPP: 10.0000 mg | Freq: Every day | RECTAL | Status: DC | PRN

## 2021-12-18 MED ORDER — CEFAZOLIN SODIUM-DEXTROSE 2-4 GM/100ML-% IV SOLN
2.0000 g | Freq: Three times a day (TID) | INTRAVENOUS | Status: AC
  Administered 2021-12-18 (×2): 2 g via INTRAVENOUS
  Filled 2021-12-18 (×2): qty 100

## 2021-12-18 MED ORDER — SUGAMMADEX SODIUM 200 MG/2ML IV SOLN
INTRAVENOUS | Status: DC | PRN
  Administered 2021-12-18: 200 mg via INTRAVENOUS

## 2021-12-18 MED ORDER — LIDOCAINE HCL (PF) 1 % IJ SOLN
INTRAMUSCULAR | Status: AC
  Filled 2021-12-18: qty 30

## 2021-12-18 MED ORDER — SODIUM CHLORIDE 0.9 % IV SOLN: INTRAVENOUS | Status: DC

## 2021-12-18 MED ORDER — CHLORHEXIDINE GLUCONATE CLOTH 2 % EX PADS: 6.0000 | MEDICATED_PAD | Freq: Once | CUTANEOUS | Status: DC

## 2021-12-18 MED ORDER — SODIUM CHLORIDE 0.9 % IV SOLN: 500.0000 mL | Freq: Once | INTRAVENOUS | Status: DC | PRN

## 2021-12-18 MED ORDER — DEXAMETHASONE SODIUM PHOSPHATE 10 MG/ML IJ SOLN
INTRAMUSCULAR | Status: DC | PRN
  Administered 2021-12-18: 10 mg via INTRAVENOUS

## 2021-12-18 MED ORDER — CHLORHEXIDINE GLUCONATE 0.12 % MT SOLN
OROMUCOSAL | Status: AC
  Filled 2021-12-18: qty 15

## 2021-12-18 MED ORDER — HEPARIN 6000 UNIT IRRIGATION SOLUTION
Status: AC
  Filled 2021-12-18: qty 500

## 2021-12-18 MED ORDER — TAMSULOSIN HCL 0.4 MG PO CAPS
0.4000 mg | ORAL_CAPSULE | Freq: Every morning | ORAL | Status: DC
  Administered 2021-12-19: 0.4 mg via ORAL
  Filled 2021-12-18: qty 1

## 2021-12-18 MED ORDER — PROPOFOL 10 MG/ML IV BOLUS
INTRAVENOUS | Status: DC | PRN
  Administered 2021-12-18: 100 mg via INTRAVENOUS

## 2021-12-18 MED ORDER — FENTANYL CITRATE (PF) 250 MCG/5ML IJ SOLN
INTRAMUSCULAR | Status: DC | PRN
  Administered 2021-12-18 (×5): 50 ug via INTRAVENOUS

## 2021-12-18 MED ORDER — ACETAMINOPHEN 650 MG RE SUPP: 325.0000 mg | RECTAL | Status: DC | PRN

## 2021-12-18 MED ORDER — ALUM & MAG HYDROXIDE-SIMETH 200-200-20 MG/5ML PO SUSP: 15.0000 mL | ORAL | Status: DC | PRN

## 2021-12-18 MED ORDER — 0.9 % SODIUM CHLORIDE (POUR BTL) OPTIME
TOPICAL | Status: DC | PRN
  Administered 2021-12-18: 2000 mL

## 2021-12-18 MED ORDER — GUAIFENESIN-DM 100-10 MG/5ML PO SYRP: 15.0000 mL | ORAL_SOLUTION | ORAL | Status: DC | PRN

## 2021-12-18 MED ORDER — ASPIRIN 81 MG PO TBEC
81.0000 mg | DELAYED_RELEASE_TABLET | Freq: Every day | ORAL | Status: DC
  Administered 2021-12-19: 81 mg via ORAL
  Filled 2021-12-18: qty 1

## 2021-12-18 MED ORDER — OXYCODONE-ACETAMINOPHEN 5-325 MG PO TABS
1.0000 | ORAL_TABLET | ORAL | Status: DC | PRN
  Administered 2021-12-19: 1 via ORAL
  Filled 2021-12-18: qty 1

## 2021-12-18 MED ORDER — HEMOSTATIC AGENTS (NO CHARGE) OPTIME
TOPICAL | Status: DC | PRN
  Administered 2021-12-18 (×2): 1 via TOPICAL

## 2021-12-18 MED ORDER — ROCURONIUM BROMIDE 10 MG/ML (PF) SYRINGE
PREFILLED_SYRINGE | INTRAVENOUS | Status: AC
  Filled 2021-12-18: qty 10

## 2021-12-18 MED ORDER — PROTAMINE SULFATE 10 MG/ML IV SOLN
INTRAVENOUS | Status: DC | PRN
  Administered 2021-12-18: 50 mg via INTRAVENOUS

## 2021-12-18 MED ORDER — EPHEDRINE SULFATE-NACL 50-0.9 MG/10ML-% IV SOSY
PREFILLED_SYRINGE | INTRAVENOUS | Status: DC | PRN
  Administered 2021-12-18 (×3): 5 mg via INTRAVENOUS
  Administered 2021-12-18: 10 mg via INTRAVENOUS

## 2021-12-18 MED ORDER — CHLORHEXIDINE GLUCONATE 0.12 % MT SOLN
15.0000 mL | Freq: Once | OROMUCOSAL | Status: AC
  Administered 2021-12-18: 15 mL via OROMUCOSAL

## 2021-12-18 MED ORDER — METOPROLOL TARTRATE 5 MG/5ML IV SOLN: 2.0000 mg | INTRAVENOUS | Status: DC | PRN

## 2021-12-18 MED ORDER — CEFAZOLIN SODIUM-DEXTROSE 2-4 GM/100ML-% IV SOLN
2.0000 g | INTRAVENOUS | Status: AC
  Administered 2021-12-18: 2 g via INTRAVENOUS

## 2021-12-18 MED ORDER — POLYETHYLENE GLYCOL 3350 17 G PO PACK: 17.0000 g | PACK | Freq: Every day | ORAL | Status: DC | PRN

## 2021-12-18 MED ORDER — CEFAZOLIN SODIUM-DEXTROSE 2-4 GM/100ML-% IV SOLN
INTRAVENOUS | Status: AC
  Filled 2021-12-18: qty 100

## 2021-12-18 MED ORDER — FENTANYL CITRATE (PF) 100 MCG/2ML IJ SOLN: 25.0000 ug | INTRAMUSCULAR | Status: DC | PRN

## 2021-12-18 MED ORDER — POTASSIUM CHLORIDE CRYS ER 20 MEQ PO TBCR: 20.0000 meq | EXTENDED_RELEASE_TABLET | Freq: Every day | ORAL | Status: DC | PRN

## 2021-12-18 MED ORDER — ROCURONIUM BROMIDE 10 MG/ML (PF) SYRINGE
PREFILLED_SYRINGE | INTRAVENOUS | Status: DC | PRN
  Administered 2021-12-18: 70 mg via INTRAVENOUS
  Administered 2021-12-18: 30 mg via INTRAVENOUS

## 2021-12-18 MED ORDER — MOMETASONE FURO-FORMOTEROL FUM 200-5 MCG/ACT IN AERO
2.0000 | INHALATION_SPRAY | Freq: Two times a day (BID) | RESPIRATORY_TRACT | Status: DC
  Administered 2021-12-19: 2 via RESPIRATORY_TRACT
  Filled 2021-12-18: qty 8.8

## 2021-12-18 MED ORDER — HEPARIN 6000 UNIT IRRIGATION SOLUTION
Status: DC | PRN
  Administered 2021-12-18: 1

## 2021-12-18 MED ORDER — PHENOL 1.4 % MT LIQD: 1.0000 | OROMUCOSAL | Status: DC | PRN

## 2021-12-18 MED ORDER — PHENYLEPHRINE HCL-NACL 20-0.9 MG/250ML-% IV SOLN
INTRAVENOUS | Status: DC | PRN
  Administered 2021-12-18: 50 ug/min via INTRAVENOUS

## 2021-12-18 MED ORDER — MAGNESIUM SULFATE 2 GM/50ML IV SOLN: 2.0000 g | Freq: Every day | INTRAVENOUS | Status: DC | PRN

## 2021-12-18 MED ORDER — LACTATED RINGERS IV SOLN: INTRAVENOUS | Status: DC

## 2021-12-18 MED ORDER — DOCUSATE SODIUM 100 MG PO CAPS: 100.0000 mg | ORAL_CAPSULE | Freq: Every day | ORAL | Status: DC

## 2021-12-18 MED ORDER — HEPARIN SODIUM (PORCINE) 1000 UNIT/ML IJ SOLN
INTRAMUSCULAR | Status: DC | PRN
  Administered 2021-12-18: 9000 [IU] via INTRAVENOUS
  Administered 2021-12-18: 1000 [IU] via INTRAVENOUS

## 2021-12-18 MED ORDER — ACETAMINOPHEN 10 MG/ML IV SOLN: 1000.0000 mg | Freq: Once | INTRAVENOUS | Status: DC | PRN

## 2021-12-18 MED ORDER — PANTOPRAZOLE SODIUM 40 MG PO TBEC
40.0000 mg | DELAYED_RELEASE_TABLET | Freq: Every day | ORAL | Status: DC
  Filled 2021-12-18: qty 1

## 2021-12-18 MED ORDER — LABETALOL HCL 5 MG/ML IV SOLN: 10.0000 mg | INTRAVENOUS | Status: DC | PRN

## 2021-12-18 MED ORDER — ORAL CARE MOUTH RINSE: 15.0000 mL | Freq: Once | OROMUCOSAL | Status: AC

## 2021-12-18 MED ORDER — EPHEDRINE 5 MG/ML INJ
INTRAVENOUS | Status: AC
  Filled 2021-12-18: qty 5

## 2021-12-18 MED ORDER — MORPHINE SULFATE (PF) 2 MG/ML IV SOLN: 2.0000 mg | INTRAVENOUS | Status: DC | PRN

## 2021-12-18 MED ORDER — LIDOCAINE 2% (20 MG/ML) 5 ML SYRINGE
INTRAMUSCULAR | Status: DC | PRN
  Administered 2021-12-18: 60 mg via INTRAVENOUS

## 2021-12-18 MED ORDER — ROSUVASTATIN CALCIUM 5 MG PO TABS
10.0000 mg | ORAL_TABLET | Freq: Every day | ORAL | Status: DC
  Filled 2021-12-18: qty 2

## 2021-12-18 SURGICAL SUPPLY — 40 items
BAG COUNTER SPONGE SURGICOUNT (BAG) ×1 IMPLANT
BAG DECANTER FOR FLEXI CONT (MISCELLANEOUS) ×1 IMPLANT
CANISTER SUCT 3000ML PPV (MISCELLANEOUS) ×1 IMPLANT
CLIP VESOCCLUDE MED 24/CT (CLIP) ×1 IMPLANT
CLIP VESOCCLUDE SM WIDE 24/CT (CLIP) ×1 IMPLANT
DERMABOND ADVANCED .7 DNX12 (GAUZE/BANDAGES/DRESSINGS) ×1 IMPLANT
DRAIN CHANNEL 15F RND FF W/TCR (WOUND CARE) IMPLANT
ELECT REM PT RETURN 9FT ADLT (ELECTROSURGICAL) ×1
ELECTRODE REM PT RTRN 9FT ADLT (ELECTROSURGICAL) ×1 IMPLANT
EVACUATOR SILICONE 100CC (DRAIN) IMPLANT
FELT TEFLON 1X6 (MISCELLANEOUS) IMPLANT
GAUZE SPONGE 4X4 12PLY STRL (GAUZE/BANDAGES/DRESSINGS) ×1 IMPLANT
GLOVE BIO SURGEON STRL SZ 6.5 (GLOVE) IMPLANT
GLOVE BIOGEL PI IND STRL 7.5 (GLOVE) IMPLANT
GLOVE INDICATOR 8.0 STRL GRN (GLOVE) IMPLANT
GLOVE SURG SS PI 7.5 STRL IVOR (GLOVE) ×3 IMPLANT
GOWN STRL REUS W/ TWL LRG LVL3 (GOWN DISPOSABLE) ×2 IMPLANT
GOWN STRL REUS W/ TWL XL LVL3 (GOWN DISPOSABLE) ×1 IMPLANT
GOWN STRL REUS W/TWL LRG LVL3 (GOWN DISPOSABLE) ×2
GOWN STRL REUS W/TWL XL LVL3 (GOWN DISPOSABLE) ×1
HEMOSTAT SNOW SURGICEL 2X4 (HEMOSTASIS) IMPLANT
HEMOSTAT SURGICEL 2X14 (HEMOSTASIS) IMPLANT
KIT BASIN OR (CUSTOM PROCEDURE TRAY) ×1 IMPLANT
KIT DRAIN CSF ACCUDRAIN (MISCELLANEOUS) IMPLANT
KIT TURNOVER KIT B (KITS) ×1 IMPLANT
NS IRRIG 1000ML POUR BTL (IV SOLUTION) ×2 IMPLANT
PACK CAROTID (CUSTOM PROCEDURE TRAY) ×1 IMPLANT
PAD ARMBOARD 7.5X6 YLW CONV (MISCELLANEOUS) ×2 IMPLANT
POSITIONER HEAD DONUT 9IN (MISCELLANEOUS) ×1 IMPLANT
PUNCH AORTIC ROTATE 5MM 8IN (MISCELLANEOUS) IMPLANT
SURGIFLO W/THROMBIN 8M KIT (HEMOSTASIS) IMPLANT
SUT ETHILON 3 0 PS 1 (SUTURE) IMPLANT
SUT PROLENE 5 0 C 1 24 (SUTURE) ×1 IMPLANT
SUT PROLENE 6 0 BV (SUTURE) ×1 IMPLANT
SUT VIC AB 3-0 SH 27 (SUTURE) ×1
SUT VIC AB 3-0 SH 27X BRD (SUTURE) ×2 IMPLANT
SUT VIC AB 3-0 X1 27 (SUTURE) ×1 IMPLANT
TOWEL GREEN STERILE (TOWEL DISPOSABLE) ×1 IMPLANT
TRAY FOLEY MTR SLVR 16FR STAT (SET/KITS/TRAYS/PACK) ×1 IMPLANT
WATER STERILE IRR 1000ML POUR (IV SOLUTION) ×1 IMPLANT

## 2021-12-18 NOTE — Discharge Instructions (Signed)
   [facility]  Discharge Instructions   Carotid Surgery  Please refer to the following instructions for your post-procedure care. Your surgeon or physician assistant will discuss any changes with you.  Activity  You are encouraged to walk as much as you can. You can slowly return to normal activities but must avoid strenuous activity and heavy lifting until your doctor tell you it's okay. Avoid activities such as vacuuming or swinging a golf club. You can drive after one week if you are comfortable and you are no longer taking prescription pain medications. It is normal to feel tired for serval weeks after your surgery. It is also normal to have difficulty with sleep habits, eating, and bowel movements after surgery. These will go away with time.  Bathing/Showering  Shower daily after you go home. Do not soak in a bathtub, hot tub, or swim until the incision heals completely.  Incision Care  Shower every day. Clean your incision with mild soap and water. Pat the area dry with a clean towel. You do not need a bandage unless otherwise instructed. Do not apply any ointments or creams to your incision. You may have skin glue on your incision. Do not peel it off. It will come off on its own in about one week. Your incision may feel thickened and raised for several weeks after your surgery. This is normal and the skin will soften over time.   For Men Only: It's okay to shave around the incision but do not shave the incision itself for 2 weeks. It is common to have numbness under your chin that could last for several months.  Diet  Resume your normal diet. There are no special food restrictions following this procedure. A low fat/low cholesterol diet is recommended for all patients with vascular disease. In order to heal from your surgery, it is CRITICAL to get adequate nutrition. Your body requires vitamins, minerals, and protein. Vegetables are the best source of  vitamins and minerals. Vegetables also provide the perfect balance of protein. Processed food has little nutritional value, so try to avoid this.  Medications  Resume taking all of your medications unless your doctor or physician assistant tells you not to. If your incision is causing pain, you may take over-the- counter pain relievers such as acetaminophen (Tylenol). If you were prescribed a stronger pain medication, please be aware these medications can cause nausea and constipation. Prevent nausea by taking the medication with a snack or meal. Avoid constipation by drinking plenty of fluids and eating foods with a high amount of fiber, such as fruits, vegetables, and grains.   Do not take Tylenol if you are taking prescription pain medications.  Follow Up  Our office will schedule a follow up appointment 2-3 weeks following discharge.  Please call us immediately for any of the following conditions  . Increased pain, redness, drainage (pus) from your incision site. . Fever of 101 degrees or higher. . If you should develop stroke (slurred speech, difficulty swallowing, weakness on one side of your body, loss of vision) you should call 911 and go to the nearest emergency room. .  Reduce your risk of vascular disease:  . Stop smoking. If you would like help call [facility] at 1-800-QUIT-NOW (1-800-784-8669) or [facility] at 336-586-4000. . Manage your cholesterol . Maintain a desired weight . Control your diabetes . Keep your blood pressure down .  If you have any questions, please call the office at [phone]. 

## 2021-12-18 NOTE — Anesthesia Postprocedure Evaluation (Signed)
Anesthesia Post Note  Patient: Jason Abbott.  Procedure(s) Performed: RIGHT BYPASS GRAFT CAROTID-SUBCLAVIAN (Right)     Patient location during evaluation: PACU Anesthesia Type: General Level of consciousness: awake and alert Pain management: pain level controlled Vital Signs Assessment: post-procedure vital signs reviewed and stable Respiratory status: spontaneous breathing, nonlabored ventilation, respiratory function stable and patient connected to nasal cannula oxygen Cardiovascular status: blood pressure returned to baseline and stable Postop Assessment: no apparent nausea or vomiting Anesthetic complications: no   No notable events documented.  Last Vitals:  Vitals:   12/18/21 1500 12/18/21 1549  BP: 114/69 118/71  Pulse: 65 63  Resp: 17 16  Temp:    SpO2: 99% 98%    Last Pain:  Vitals:   12/18/21 1549  TempSrc:   PainSc: 0-No pain                 Belenda Cruise P Antavion Bartoszek

## 2021-12-18 NOTE — Transfer of Care (Signed)
Immediate Anesthesia Transfer of Care Note  Patient: Jason Abbott.  Procedure(s) Performed: RIGHT BYPASS GRAFT CAROTID-SUBCLAVIAN (Right)  Patient Location: PACU  Anesthesia Type:General  Level of Consciousness: drowsy and patient cooperative  Airway & Oxygen Therapy: Patient Spontanous Breathing  Post-op Assessment: Report given to RN and Post -op Vital signs reviewed and stable  Post vital signs: Reviewed and stable  Last Vitals:  Vitals Value Taken Time  BP 140/84 12/18/21 1110  Temp    Pulse 75 12/18/21 1113  Resp 24 12/18/21 1113  SpO2 94 % 12/18/21 1113  Vitals shown include unvalidated device data.  Last Pain:  Vitals:   12/18/21 0723  TempSrc: Oral  PainSc:          Complications: No notable events documented.

## 2021-12-18 NOTE — Op Note (Signed)
Patient name: Jason Abbott. MRN: [id number] DOB: [birth year] Sex: male  12/18/2021 Pre-operative Diagnosis: Thoracic aortic aneurysm, aberrant right subclavian artery Post-operative diagnosis:  Same Surgeon:  [name] Assistants:  Dionicio Stall Procedure:   Right subclavian to common carotid artery transposition Anesthesia:  General Blood Loss:  minimal Specimens:  none  Findings: The subclavian artery was oversewn proximally.  I visualized what I thought was the vertebral artery, distal to where it was transected.  The subclavian artery was plugged into the right lateral side of the common carotid artery in a end-to-side fashion.  The vagus nerve was placed posterior to the anastomosis.  Indications: The patient has a 6 cm thoracic aortic aneurysm which will require aortic arch de-branching and stent grafting for repair he also has an aberrant right subclavian artery.  I elected to stage his procedures.  Today he comes in for carotid subclavian transposition.  Procedure:  The patient was identified in the holding area and taken to [place] 11  The patient was then placed supine on the table. general anesthesia was administered.  The patient was prepped and draped in the usual sterile fashion.  A time out was called and antibiotics were administered.  A PA was necessary to expedite the procedure and assist with technical details.  She provided exposure via suction and retraction as well as help with the anastomosis by following the suture.  A transverse incision was made above the clavicle on the right side.  Cautery was used to divide the subcutaneous tissue and platysma muscle.  I developed a plane between the 2 heads of the sternocleidomastoid.  I dissected down to the jugular vein which was reflected laterally.  I then exposed the common carotid artery throughout the width of the incision.  This artery was disease-free.  The vagus nerve was visualized and protected.  I then  was able to easily palpate the subclavian artery within the wound posterior to the carotid artery.  It was then sharply dissected free.  I dissected out as proximally as I could.  There were no branches coming off of the artery at this level.  I then dissected out distally.  Identified what look like the vertebral artery which was protected.  Next, the patient was fully heparinized.  The subclavian artery was then occluded proximally and distally with Henley clamps and then divided.  The proximal end was oversewn with a 5-0 Prolene in 2 layers.  I then selected a anastomotic site on the right lateral side of the common carotid artery.  The carotid artery was then occluded.  A #11 blade was used to make an arteriotomy.  This was further opened with a #5 punch.  I then performed a end-to-side anastomosis between the carotid and right subclavian artery with running 5-0 Prolene.  Prior to completion, the appropriate flushing maneuvers were performed.  The anastomosis was then completed.  Sequential D clamping was then performed.  Doppler was used to evaluate the signal in the common carotid and subclavian artery which were multiphasic.  The wound was then copiously irrigated.  Hemostasis was achieved.  There was no lymphatic leak.  Surgiflo was placed over the anastomosis and the subclavian stump.  I then reapproximated the platysma muscle with running 3-0 Vicryl.  The skin was closed with a running 3-0 Vicryl followed by Dermabond.  There were no immediate complications.  The patient successfully extubated taken recovery in stable condition.   Disposition: To PACU stable.  Theotis Burrow, M.D., [facility] [place] Office: [phone] Pager:  [phone]

## 2021-12-18 NOTE — Anesthesia Procedure Notes (Signed)
Procedure Name: Intubation Date/Time: 12/18/2021 8:54 AM  Performed by: Lance Coon, CRNAPre-anesthesia Checklist: Patient identified, Emergency Drugs available, Suction available, Patient being monitored and Timeout performed Patient Re-evaluated:Patient Re-evaluated prior to induction Oxygen Delivery Method: Circle system utilized Preoxygenation: Pre-oxygenation with 100% oxygen Induction Type: IV induction Ventilation: Mask ventilation without difficulty Laryngoscope Size: Miller and 3 Grade View: Grade I Tube type: Oral Tube size: 7.5 mm Number of attempts: 1 Airway Equipment and Method: Stylet Placement Confirmation: ETT inserted through vocal cords under direct vision, positive ETCO2 and breath sounds checked- equal and bilateral Secured at: 22 cm Tube secured with: Tape Dental Injury: Teeth and Oropharynx as per pre-operative assessment

## 2021-12-18 NOTE — H&P (Signed)
[facility] [place]   Patient name: Jason Abbott.    MRN: [id number]        DOB: [birth year]          Sex: male     REASON FOR VISIT:      Follow up   HISOTRY OF PRESENT ILLNESS:      Jason Abbott. is a 76 y.o. male, who is today for evaluation of a aortic arch aneurysm that was first noticed approximately 2 years ago during imaging for COVID.  At that time was 4.9 cm.  He recently had a scan showing that it has increased in size to approximately 6 cm.  He is being considered for arch reconstruction and thoracic stent graft to exclude his aneurysm.  Unfortunately, he was hospitalized for pneumonia and so his procedure has been delayed.  He comes in today for further discussion and follow-up.   He appears to have recovered from his pneumonia.   He is very active and plays an electric accordion regularly which weighs approximately 28 pounds.  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  He has had a stroke in the past with no residual deficits.  He is a former smoker.  He does not report any trauma other than a injury in the [employer] where he hurt his [facility] [employer] joint from a significant fall     PAST MEDICAL HISTORY:        Past Medical History:  Diagnosis Date   Bowel obstruction ([place])     Diverticul disease small and large intestine, no perforati or abscess     H/O urinary infection     Shingles outbreak 10/03/2013   Stroke ([place]) 01/2019        FAMILY HISTORY:         Family History  Problem Relation Age of Onset   Cancer Mother          pancreatic   COPD Father     Diabetes Sister     Diabetes Brother     Diabetes Brother        SOCIAL HISTORY:    Social History         Tobacco Use   Smoking status: Former      Packs/day: 0.50      Years: 23.00      Total pack years: 11.50      Types: Cigarettes      Quit date: 03/05/1988      Years since quitting: 33.6   Smokeless  tobacco: Never  Substance Use Topics   Alcohol use: Not Currently      Comment: rarely        ALLERGIES:         Allergies  Allergen Reactions   Levaquin [Levofloxacin] Other (See Comments)      Due to medical hx        CURRENT MEDICATIONS:          Current Outpatient Medications  Medication Sig Dispense Refill   Budeson-Glycopyrrol-Formoterol (BREZTRI AEROSPHERE) 160-9-4.8 MCG/ACT AERO Inhale 2 puffs into the lungs 2 (two) times daily. 1 g 11   Magnesium Citrate 200 MG TABS Take 400 mg by mouth daily.       tamsulosin (FLOMAX) 0.4 MG CAPS capsule Take 0.4 mg by mouth daily.        No current facility-administered medications for this visit.      REVIEW OF SYSTEMS:    [  X] denotes positive finding, '[ ]'$  denotes negative finding Cardiac   Comments:  Chest pain or chest pressure:      Shortness of breath upon exertion:      Short of breath when lying flat:      Irregular heart rhythm:             Vascular      Pain in calf, thigh, or hip brought on by ambulation:      Pain in feet at night that wakes you up from your sleep:       Blood clot in your veins:      Leg swelling:              Pulmonary      Oxygen at home:      Productive cough:       Wheezing:              Neurologic      Sudden weakness in arms or legs:       Sudden numbness in arms or legs:       Sudden onset of difficulty speaking or slurred speech:      Temporary loss of vision in one eye:       Problems with dizziness:              Gastrointestinal      Blood in stool:       Vomited blood:              Genitourinary      Burning when urinating:       Blood in urine:             Psychiatric      Major depression:              Hematologic      Bleeding problems:      Problems with blood clotting too easily:             Skin      Rashes or ulcers:             Constitutional      Fever or chills:          PHYSICAL EXAM:       Vitals:    11/04/21 1032  BP: 113/75  Pulse: 66   Resp: 20  Temp: 98 F (36.7 C)  SpO2: 96%  Weight: 188 lb (85.3 kg)  Height: 5' 10.5" (1.791 m)      GENERAL: The patient is a well-nourished male, in no acute distress. The vital signs are documented above. CARDIAC: There is a regular rate and rhythm.  PULMONARY: Non-labored respirations ABDOMEN: Soft and non-tender with normal pitched bowel sounds.  MUSCULOSKELETAL: There are no major deformities or cyanosis. NEUROLOGIC: No focal weakness or paresthesias are detected. SKIN: There are no ulcers or rashes noted. PSYCHIATRIC: The patient has a normal affect.   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  I discussed with the patient that we would begin with a right carotid subclavian transposition versus bypass.  This will be through a right supraclavicular incision.  We will evaluate how he does postoperatively before proceeding with aortic arch reconstruction and thoracic stent graft.  I discussed the details of the operation including the risk of lymphatic leak infection  and cardiopulmonary issues.  All questions were answered.  We will schedule his right carotid subclavian surgery in the near future and then get him scheduled for his sternotomy shortly thereafter.       [name], MD, FACS [facility] [phone] Pager [phone]

## 2021-12-18 NOTE — Anesthesia Procedure Notes (Signed)
Arterial Line Insertion Start/End12/13/2023 7:55 AM, 12/18/2021 8:15 AM Performed by: Lance Coon, CRNA, CRNA  Preanesthetic checklist: patient identified, IV checked, site marked, risks and benefits discussed, surgical consent, monitors and equipment checked, pre-op evaluation, timeout performed and anesthesia consent Lidocaine 1% used for infiltration Left, radial was placed Catheter size: 20 G Hand hygiene performed  and maximum sterile barriers used   Attempts: 2 Procedure performed without using ultrasound guided technique. Following insertion, dressing applied and Biopatch. Post procedure assessment: normal and unchanged  Patient tolerated the procedure well with no immediate complications.

## 2021-12-19 ENCOUNTER — Telehealth: Payer: Self-pay | Admitting: Physician Assistant

## 2021-12-19 ENCOUNTER — Encounter (HOSPITAL_COMMUNITY): Payer: Self-pay | Admitting: Surgery

## 2021-12-19 LAB — CBC
HCT: 33.4 % — ABNORMAL LOW (ref 39.0–52.0)
Hemoglobin: 11.3 g/dL — ABNORMAL LOW (ref 13.0–17.0)
MCH: 32.5 pg (ref 26.0–34.0)
MCHC: 33.8 g/dL (ref 30.0–36.0)
MCV: 96 fL (ref 80.0–100.0)
Platelets: 118 10*3/uL — ABNORMAL LOW (ref 150–400)
RBC: 3.48 MIL/uL — ABNORMAL LOW (ref 4.22–5.81)
RDW: 13.3 % (ref 11.5–15.5)
WBC: 18.6 10*3/uL — ABNORMAL HIGH (ref 4.0–10.5)
nRBC: 0.1 % (ref 0.0–0.2)

## 2021-12-19 LAB — BASIC METABOLIC PANEL
Anion gap: 8 (ref 5–15)
BUN: 15 mg/dL (ref 8–23)
CO2: 25 mmol/L (ref 22–32)
Calcium: 8.7 mg/dL — ABNORMAL LOW (ref 8.9–10.3)
Chloride: 105 mmol/L (ref 98–111)
Creatinine, Ser: 1.07 mg/dL (ref 0.61–1.24)
GFR, Estimated: >60 mL/min (ref >60)
Glucose, Bld: 166 mg/dL — ABNORMAL HIGH (ref 70–99)
Potassium: 4.2 mmol/L (ref 3.5–5.1)
Sodium: 138 mmol/L (ref 135–145)

## 2021-12-19 MED ORDER — ASPIRIN 81 MG PO TBEC: 81.0000 mg | DELAYED_RELEASE_TABLET | Freq: Every day | ORAL | 12 refills | Status: DC

## 2021-12-19 MED ORDER — OXYCODONE-ACETAMINOPHEN 5-325 MG PO TABS: 1.0000 | ORAL_TABLET | ORAL | 0 refills | Status: DC | PRN

## 2021-12-19 NOTE — Progress Notes (Signed)
Mobility Specialist Progress Note:   12/19/21 1032  Mobility  Activity Ambulated with assistance in hallway  Level of Assistance Modified independent, requires aide device or extra time  Assistive Device None  Distance Ambulated (ft) 300 ft  Activity Response Tolerated well  $Mobility charge 1 Mobility   Pt received in bed willing to participate in mobility. No complaints of pain. Left EOB with call bell in reach and all needs met.   Gareth Eagle Taejon Irani Mobility Specialist Please contact via [employer] or  Rehab Office at [phone]

## 2021-12-19 NOTE — Telephone Encounter (Signed)
-----   Message from Gabriel Earing, [place] sent at 12/18/2021 11:06 AM EST ----- S/p right subclavian carotid bypass 12/13.  Per Dr. Trula Slade, please add this to his schedule DR. BRABHAM ONLY on 12/28 between vein cases to discuss upcoming surgery.  Thanks

## 2021-12-19 NOTE — Progress Notes (Signed)
  Progress Note    12/19/2021 7:57 AM 1 Day Post-Op  Subjective:  denies any pain at incision site   Vitals:   12/19/21 0319 12/19/21 0442  BP: 100/62 100/62  Pulse: 64 64  Resp: 16 16  Temp: 98.3 F (36.8 C) 98.3 F (36.8 C)  SpO2: 97% 97%    Physical Exam: Lungs:  nonlabored Incisions:  R sided neck incision c/d/l Extremities:  palpable and equal radial pulses, moving all extremities equally Neuro: no focal deficits   CBC    Component Value Date/Time   WBC 18.6 (H) 12/19/2021 0111   RBC 3.48 (L) 12/19/2021 0111   HGB 11.3 (L) 12/19/2021 0111   HGB 14.6 08/26/2021 0943   HCT 33.4 (L) 12/19/2021 0111   PLT 118 (L) 12/19/2021 0111   PLT 146 (L) 08/26/2021 0943   MCV 96.0 12/19/2021 0111   MCH 32.5 12/19/2021 0111   MCHC 33.8 12/19/2021 0111   RDW 13.3 12/19/2021 0111   LYMPHSABS 17.3 (H) 09/13/2021 0712   MONOABS 0.5 09/13/2021 0712   EOSABS 0.0 09/13/2021 0712   BASOSABS 0.0 09/13/2021 0712    BMET    Component Value Date/Time   NA 138 12/19/2021 0111   K 4.2 12/19/2021 0111   CL 105 12/19/2021 0111   CO2 25 12/19/2021 0111   GLUCOSE 166 (H) 12/19/2021 0111   BUN 15 12/19/2021 0111   CREATININE 1.07 12/19/2021 0111   CREATININE 1.04 08/26/2021 0943   CREATININE 1.03 08/15/2021 0000   CALCIUM 8.7 (L) 12/19/2021 0111   GFRNONAA >60 12/19/2021 0111   GFRNONAA >60 08/26/2021 0943   GFRNONAA 84 03/30/2019 1603   GFRAA >60 08/22/2019 1011   GFRAA 97 03/30/2019 1603    INR    Component Value Date/Time   INR 1.0 12/12/2021 1340     Intake/Output Summary (Last 24 hours) at 12/19/2021 0757 Last data filed at 12/19/2021 0320 Gross per 24 hour  Intake 2099.17 ml  Output 850 ml  Net 1249.17 ml      Assessment/Plan:  76 y.o. male is 1 day post op, s/p:  right subclavian to common carotid artery transposition    -R sided neck incision c/d/l, no hematoma -Neuro intact. No focal deficits and moving all extremities. Palpable and equal radial  pulses -Will start him on ASA '81mg'$  -Will d/c today. He will follow up with Dr.Brabham on Dec.28th to discuss thoracic aneurysm repair   Jason Serene, PA-C [facility] [phone] 12/19/2021 7:57 AM

## 2021-12-19 NOTE — Progress Notes (Signed)
Discharge instructions given. Patient verbalized understanding and all questions were answered.  ?

## 2021-12-20 ENCOUNTER — Telehealth: Payer: Self-pay | Admitting: *Deleted

## 2021-12-20 DIAGNOSIS — Z8701 Personal history of pneumonia (recurrent): Secondary | ICD-10-CM | POA: Diagnosis not present

## 2021-12-20 DIAGNOSIS — Z87891 Personal history of nicotine dependence: Secondary | ICD-10-CM | POA: Diagnosis not present

## 2021-12-20 DIAGNOSIS — C9110 Chronic lymphocytic leukemia of B-cell type not having achieved remission: Secondary | ICD-10-CM | POA: Diagnosis not present

## 2021-12-20 DIAGNOSIS — Z8673 Personal history of transient ischemic attack (TIA), and cerebral infarction without residual deficits: Secondary | ICD-10-CM | POA: Diagnosis not present

## 2021-12-20 DIAGNOSIS — Z48812 Encounter for surgical aftercare following surgery on the circulatory system: Secondary | ICD-10-CM | POA: Diagnosis not present

## 2021-12-20 DIAGNOSIS — I7121 Aneurysm of the ascending aorta, without rupture: Secondary | ICD-10-CM | POA: Diagnosis not present

## 2021-12-20 DIAGNOSIS — Z87440 Personal history of urinary (tract) infections: Secondary | ICD-10-CM | POA: Diagnosis not present

## 2021-12-20 NOTE — Patient Outreach (Signed)
  Care Coordination [facility] Note Transition Care Management Follow-up Telephone Call Date of discharge and from where: [facility] [phone] How have you been since you were released from the hospital? Throat is sore and difficulty talking. Wife came on phone and answered questions with pt beside her Any questions or concerns? Yes RN instructed patient no heaving lifting greater that 10 lbs. Patient had already picked up his accordion that is over 20 lbs. RN reiterated the importance of no heavy lifting.  Items Reviewed: Did the pt receive and understand the discharge instructions provided? Yes  Medications obtained and verified? Yes  Wife is picking up the EC aspirin today Other? No  Any new allergies since your discharge? No  Dietary orders reviewed? No Do you have support at home? Yes   Home Care and Equipment/Supplies: Were home health services ordered? no If so, what is the name of the agency? N/a  Has the agency set up a time to come to the patient's home? not applicable Were any new equipment or medical supplies ordered?  No What is the name of the medical supply agency? N/a Were you able to get the supplies/equipment? not applicable Do you have any questions related to the use of the equipment or supplies? No  Functional Questionnaire: (I = Independent and D = Dependent) ADLs: D  Bathing/Dressing- I  Meal Prep- D  Eating- I  Maintaining continence- I  Transferring/Ambulation- I  Managing Meds- I  Follow up appointments reviewed:  PCP Hospital f/u appt confirmed? No  [facility] f/u appt confirmed? Yes  Dr Trula Slade [phone] Dr Cyndia Bent [phone] For aortic arch debranching. Are transportation arrangements needed? No  If their condition worsens, is the pt aware to call PCP or go to the Emergency Dept.? Yes Was the patient provided with contact information for the PCP's office or ED? Yes Was to pt encouraged to call back with questions or concerns? Yes  SDOH assessments  and interventions completed:   Yes  SDOH Screenings   Food Insecurity: No Food Insecurity (12/20/2021)  Housing: Low Risk  (12/20/2021)  Transportation Needs: No Transportation Needs (12/20/2021)  Depression (PHQ2-9): Low Risk  (02/28/2020)  Financial Resource Strain: Low Risk  (09/16/2021)  Tobacco Use: Medium Risk (12/19/2021)     Care Coordination Interventions:  RN reiterated the importance of no heavy lifting. RN discussed drinking warm drinks to help soothe throat    Encounter Outcome:  Pt. Visit Completed    [place] [facility] [phone]

## 2021-12-24 ENCOUNTER — Encounter: Payer: Self-pay | Admitting: Surgery

## 2021-12-24 DIAGNOSIS — Z87440 Personal history of urinary (tract) infections: Secondary | ICD-10-CM | POA: Diagnosis not present

## 2021-12-24 DIAGNOSIS — C9110 Chronic lymphocytic leukemia of B-cell type not having achieved remission: Secondary | ICD-10-CM | POA: Diagnosis not present

## 2021-12-24 DIAGNOSIS — I7121 Aneurysm of the ascending aorta, without rupture: Secondary | ICD-10-CM | POA: Diagnosis not present

## 2021-12-24 DIAGNOSIS — Z8673 Personal history of transient ischemic attack (TIA), and cerebral infarction without residual deficits: Secondary | ICD-10-CM | POA: Diagnosis not present

## 2021-12-24 DIAGNOSIS — Z48812 Encounter for surgical aftercare following surgery on the circulatory system: Secondary | ICD-10-CM | POA: Diagnosis not present

## 2021-12-24 DIAGNOSIS — Z8701 Personal history of pneumonia (recurrent): Secondary | ICD-10-CM | POA: Diagnosis not present

## 2021-12-26 NOTE — Discharge Summary (Signed)
Discharge Summary     Jason Abbott 23-Jun-1945 76 y.o. 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  The pt tolerated the procedure well and was transported to the PACU in good condition.  By POD 1, the pt neuro status was intact.  The patient's right-sided neck incision was intact without hematoma.  The remainder of the hospital course consisted of increasing mobilization and increasing intake of solids without difficulty.  He was started on ASA 81 mg daily. He was d/c to home on POD 1.   No results for input(s): "NA", "K", "CL", "CO2", "GLUCOSE", "BUN", "CALCIUM" in the last 72 hours.  Invalid input(s): "CRATININE" No results for input(s): "WBC", "HGB", "HCT", "PLT" in the last 72 hours. No results for input(s): "INR" in the last 72 hours.   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]  Secondary Diagnosis: Patient Active Problem List   Diagnosis Date Noted   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/17/2021   Hyponatremia 09/13/2021    Hypoalbuminemia 09/13/2021   BPH (benign prostatic hyperplasia) 09/13/2021   Leg cramping 09/13/2021   Acute respiratory failure with hypoxia ([place]) 09/09/2021   Thrombocytopenia ([place]) 09/09/2021   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   Ingrown right greater toenail 08/12/2021   Chronic headaches 08/12/2021   Right hand pain 03/11/2021   Tinnitus 03/11/2021   Elevated TSH 09/12/2020   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   Community acquired bilateral lower lobe pneumonia 03/16/2019   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/27/2019   Lumbar spinal stenosis 12/21/2018   Numbness and tingling in both hands 10/20/2018   Hyperlipidemia, mixed 06/01/2018   Seborrheic keratosis 01/12/2018   Combined forms of age-related cataract of left eye 03/23/2014   Regular astigmatism of left eye 03/23/2014   Dyspepsia 02/23/2014   Right shoulder pain 10/03/2013   Past Medical History:  Diagnosis Date   Aberrant right subclavian artery    Aortic arch aneurysm (HCC)    Arthritis    Bowel obstruction (HCC)    CLL (chronic lymphocytic leukemia) ([place])    COVID 2021   hospitalized for it   Diverticul disease small and large intestine, no perforati or abscess    GERD (gastroesophageal reflux disease)    uses tums as needed   H/O urinary infection    Pneumonia 09/22/2021   Shingles outbreak 10/03/2013   Stroke ([place]) 01/2019    Allergies  as of 12/19/2021       Reactions   Levaquin [levofloxacin] Other (See Comments)   Due to medical hx        Medication List     TAKE these medications    aspirin EC 81 MG tablet Take 1 tablet (81 mg total) by mouth daily at 6 (six) AM. Swallow whole.   AZO CRANBERRY PO Take 1 each by mouth in the morning.   Breztri Aerosphere 160-9-4.8 MCG/ACT Aero Generic drug: Budeson-Glycopyrrol-Formoterol Inhale 2 puffs into the lungs 2 (two) times daily. What  changed: when to take this   ibuprofen 200 MG tablet Commonly known as: ADVIL Take 600-800 mg by mouth daily as needed for headache or mild pain.   MULTIVITAMIN GUMMIES ADULTS PO Take 2 tablets by mouth in the morning.   oxyCODONE-acetaminophen 5-325 MG tablet Commonly known as: PERCOCET/ROXICET Take 1-2 tablets by mouth every 4 (four) hours as needed for moderate pain.   POTASSIUM CITRATE PO Take 1 tablet by mouth 2 (two) times daily.   tamsulosin 0.4 MG Caps capsule Commonly known as: FLOMAX Take 0.4 mg by mouth in the morning.   VITAMIN B 12 PO Take 1 tablet by mouth in the morning.               Discharge Care Instructions  (From admission, onward)           Start     Ordered   12/19/21 0000  Discharge wound care:       Comments: Wash incision daily with mild soap and water, pat dry   12/19/21 0935             Discharge Instructions:   [facility] Discharge Instructions Carotid Surgery  Please refer to the following instructions for your post-procedure care. Your surgeon or physician assistant will discuss any changes with you.  Activity  You are encouraged to walk as much as you can. You can slowly return to normal activities but must avoid strenuous activity and heavy lifting until your doctor tell you it's OK. Avoid activities such as vacuuming or swinging a golf club. You can drive after one week if you are comfortable and you are no longer taking prescription pain medications. It is normal to feel tired for serval weeks after your surgery. It is also normal to have difficulty with sleep habits, eating, and bowel movements after surgery. These will go away with time.  Bathing/Showering  You may shower after you come home. Do not soak in a bathtub, hot tub, or swim until the incision heals completely.  Incision Care  Shower every day. Clean your incision with mild soap and water. Pat the area dry with a clean  towel. You do not need a bandage unless otherwise instructed. Do not apply any ointments or creams to your incision. You may have skin glue on your incision. Do not peel it off. It will come off on its own in about one week. Your incision may feel thickened and raised for several weeks after your surgery. This is normal and the skin will soften over time. For Men Only: It's OK to shave around the incision but do not shave the incision itself for 2 weeks. It is common to have numbness under your chin that could last for several months.  Diet  Resume your normal diet. There are no special food restrictions following this procedure. A low fat/low cholesterol diet is recommended for all patients with vascular  disease. In order to heal from your surgery, it is CRITICAL to get adequate nutrition. Your body requires vitamins, minerals, and protein. Vegetables are the best source of vitamins and minerals. Vegetables also provide the perfect balance of protein. Processed food has little nutritional value, so try to avoid this.  Medications  Resume taking all of your medications unless your doctor or physician assistant tells you not to.  If your incision is causing pain, you may take over-the- counter pain relievers such as acetaminophen (Tylenol). If you were prescribed a stronger pain medication, please be aware these medications can cause nausea and constipation.  Prevent nausea by taking the medication with a snack or meal. Avoid constipation by drinking plenty of fluids and eating foods with a high amount of fiber, such as fruits, vegetables, and grains. Do not take Tylenol if you are taking prescription pain medications.  Follow Up  You have a follow-up appointment scheduled with Dr. Trula Slade on 01/02/2022  Please call us immediately for any of the following conditions  Increased pain, redness, drainage (pus) from your incision site. Fever of 101 degrees or higher. If you should develop stroke (slurred  speech, difficulty swallowing, weakness on one side of your body, loss of vision) you should call 911 and go to the nearest emergency room.  Reduce your risk of vascular disease:  Stop smoking. If you would like help call [facility] at 1-800-QUIT-NOW (405) 157-8429) or [place] at [phone]. Manage your cholesterol Maintain a desired weight Control your diabetes Keep your blood pressure down  If you have any questions, please call the office at [phone].   Disposition: Home  Patient's condition: is Excellent  Follow up: 1.  Dr. Trula Slade on 01/02/2022   [name], PA-C [facility] [phone]   --- For [facility] use ---   Modified Rankin score at D/C (0-6): 0  IV medication needed for:  1. Hypertension: No 2. Hypotension: No  Post-op Complications: No  1. Post-op CVA or TIA: No  If yes: Event classification (right eye, left eye, right cortical, left cortical, verterobasilar, other):   If yes: Timing of event (intra-op, <6 hrs post-op, >=6 hrs post-op, unknown):   2. CN injury: No  If yes: CN  injuried   3. Myocardial infarction: No  If yes: Dx by (EKG or clinical, Troponin):   4.  CHF: No  5.  Dysrhythmia (new): No  6. Wound infection: No  7. Reperfusion symptoms: No  8. Return to OR: No  If yes: return to OR for (bleeding, neurologic, other CEA incision, other):   Discharge medications: Statin use:  No ASA use:  Yes   Beta blocker use:  No ACE-Inhibitor use:  No  ARB use:  No CCB use: No P2Y12 Antagonist use: No, '[ ]'$  Plavix, '[ ]'$  Plasugrel, '[ ]'$  Ticlopinine, '[ ]'$  Ticagrelor, '[ ]'$  Other, '[ ]'$  No for medical reason, '[ ]'$  Non-compliant, '[ ]'$  Not-indicated Anti-coagulant use:  No, '[ ]'$  Warfarin, '[ ]'$  Rivaroxaban, '[ ]'$  Dabigatran,

## 2021-12-27 LAB — TYPE AND SCREEN
ABO/RH(D): O POS
Antibody Screen: POSITIVE
DAT, IgG: POSITIVE
Unit division: 0
Unit division: 0

## 2021-12-27 LAB — BPAM RBC
Blood Product Expiration Date: 202401052359
Blood Product Expiration Date: 202401052359
Unit Type and Rh: 5100
Unit Type and Rh: 5100

## 2021-12-31 ENCOUNTER — Encounter: Payer: Self-pay | Admitting: Sports Medicine

## 2021-12-31 ENCOUNTER — Ambulatory Visit
Admission: RE | Admit: 2021-12-31 | Discharge: 2021-12-31 | Disposition: A | Discharge status: Home / Self Care | Payer: Medicare Other | Source: Ambulatory Visit | Attending: Urgent Care | Admitting: Urgent Care

## 2021-12-31 VITALS — BP 146/80 | HR 88 | Temp 99.1°F | Resp 16

## 2021-12-31 DIAGNOSIS — R490 Dysphonia: Secondary | ICD-10-CM

## 2021-12-31 DIAGNOSIS — N3000 Acute cystitis without hematuria: Secondary | ICD-10-CM

## 2021-12-31 LAB — POCT URINALYSIS DIP (MANUAL ENTRY)
Bilirubin, UA: NEGATIVE
Glucose, UA: NEGATIVE mg/dL
Ketones, POC UA: NEGATIVE mg/dL
Nitrite, UA: POSITIVE — AB
Spec Grav, UA: 1.02 (ref 1.010–1.025)
Urobilinogen, UA: 0.2 E.U./dL
pH, UA: 6 (ref 5.0–8.0)

## 2021-12-31 MED ORDER — CEFTRIAXONE SODIUM 1 G IJ SOLR
1.0000 g | Freq: Once | INTRAMUSCULAR | Status: AC
  Administered 2021-12-31: 1 g via INTRAMUSCULAR

## 2021-12-31 MED ORDER — AMOXICILLIN-POT CLAVULANATE 500-125 MG PO TABS: 1.0000 | ORAL_TABLET | Freq: Two times a day (BID) | ORAL | 0 refills | Status: DC

## 2021-12-31 NOTE — Discharge Instructions (Addendum)
Your symptoms are consistent with a urinary tract infection. Start taking the antibiotic twice daily until completed. We will send out your urine culture and only call if a change in treatment is necessary. 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  Follow-up with your PCP for further recommendations should this become a chronic issue.

## 2021-12-31 NOTE — ED Provider Notes (Signed)
[name]    CSN: [id number] Arrival date & time: 12/31/21  1336      History   Chief Complaint Chief Complaint  Patient presents with   Urinary Frequency    I'm sure that I have a uti. - Entered by patient    HPI Jason Abbott. is a 76 y.o. male.   76 year old male with a known history of UTIs presents today due to dark urine, hesitancy, and a foul odor in his urine.  States he has upcoming cardiovascular surgery and needs to ensure he has no infections.  He denies a fever.  He denies hematuria.  Reports chronic back pain but denies any change to this.  Denies a history of BPH.  Additionally, patient is requesting I listen to his heart and lungs.  States he has been hoarse recently, but also admits to recently having a right sided carotid endarterectomy and hoarseness started after that.   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[place]) 10/17/2021   Hyponatremia 09/13/2021   Hypoalbuminemia 09/13/2021   BPH (benign prostatic hyperplasia) 09/13/2021   Leg cramping 09/13/2021   Acute respiratory failure with hypoxia ([place]) 09/09/2021   Thrombocytopenia ([place]) 09/09/2021   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   Ingrown right greater toenail 08/12/2021   Chronic headaches 08/12/2021   Right hand pain 03/11/2021   Tinnitus 03/11/2021   Elevated TSH 09/12/2020   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   Community acquired bilateral  lower lobe pneumonia 03/16/2019   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/27/2019   Lumbar spinal stenosis 12/21/2018   Numbness and tingling in both hands 10/20/2018   Hyperlipidemia, mixed 06/01/2018   Seborrheic keratosis 01/12/2018   Combined forms of age-related cataract of left eye 03/23/2014   Regular astigmatism of left eye 03/23/2014   Dyspepsia 02/23/2014   Right shoulder pain 10/03/2013    Past Surgical History:  Procedure Laterality Date   CAROTID-SUBCLAVIAN BYPASS GRAFT Right 12/18/2021   Procedure: RIGHT BYPASS GRAFT CAROTID-SUBCLAVIAN;  Surgeon: Serafina Mitchell, MD;  Location: [facility] OR;  Service: Vascular;  Laterality: Right;   CARPAL TUNNEL RELEASE Right 11/25/2021   Procedure: CARPAL TUNNEL RELEASE;  Surgeon: Sherilyn Cooter, MD;  Location: [facility];  Service: Orthopedics;  Laterality: Right;  or MAC with regional block 60   HERNIA REPAIR     MECKEL DIVERTICULUM EXCISION     ORIF FINGER / THUMB FRACTURE     TENOSYNOVECTOMY Right 11/25/2021   Procedure: FLEXOR TENOSYNOVECTOMY;  Surgeon: Sherilyn Cooter, MD;  Location: [place];  Service: Orthopedics;  Laterality: Right;  or MAC with regional block 60   ulna nerve surgery         Home Medications    Prior to Admission medications   Medication Sig Start Date End Date  Taking? Authorizing Provider  amoxicillin-clavulanate (AUGMENTIN) 500-125 MG tablet Take 1 tablet by mouth 2 (two) times daily with a meal for 7 days. 12/31/21 01/07/22 Yes Jeevan Kalla L, PA  aspirin EC 81 MG tablet Take 1 tablet (81 mg total) by mouth daily at 6 (six) AM. Swallow whole. 12/20/21   Schuh, McKenzi P, PA-C  Budeson-Glycopyrrol-Formoterol (BREZTRI AEROSPHERE) 160-9-4.8 MCG/ACT AERO Inhale 2 puffs into the lungs 2 (two) times daily. Patient taking differently: Inhale 2 puffs into the lungs daily. 08/12/21   Silverio Decamp, MD   Cranberry-Vitamin C-Probiotic (AZO CRANBERRY PO) Take 1 each by mouth in the morning.    [provider]  Cyanocobalamin (VITAMIN B 12 PO) Take 1 tablet by mouth in the morning.    [provider]  ibuprofen (ADVIL) 200 MG tablet Take 600-800 mg by mouth daily as needed for headache or mild pain.    [provider]  Multiple Vitamins-Minerals (MULTIVITAMIN GUMMIES ADULTS PO) Take 2 tablets by mouth in the morning.    [provider]  oxyCODONE-acetaminophen (PERCOCET/ROXICET) 5-325 MG tablet Take 1-2 tablets by mouth every 4 (four) hours as needed for moderate pain. 12/19/21   Schuh, McKenzi P, PA-C  POTASSIUM CITRATE PO Take 1 tablet by mouth 2 (two) times daily.    [provider]  tamsulosin (FLOMAX) 0.4 MG CAPS capsule Take 0.4 mg by mouth in the morning. 08/20/21   [provider]    Family History Family History  Problem Relation Age of Onset   Cancer Mother        pancreatic   COPD Father    Diabetes Sister    Diabetes Brother    Diabetes Brother     Social History Social History   Tobacco Use   Smoking status: Former    Packs/day: 0.50    Years: 23.00    Total pack years: 11.50    Types: Cigarettes    Quit date: 1989    Years since quitting: 35.0   Smokeless tobacco: Never  Vaping Use   Vaping Use: Never used  Substance Use Topics   Alcohol use: Not Currently    Comment: rarely   Drug use: No     Allergies   Levaquin [levofloxacin]   Review of Systems Review of Systems  Genitourinary:  Positive for frequency.  As per HPI   Physical Exam Triage Vital Signs ED Triage Vitals [12/31/21 1406]  Enc Vitals Group     BP (!) 146/80     Pulse Rate 88     Resp 16     Temp 99.1 F (37.3 C)     Temp Source Oral     SpO2 95 %     Weight      Height      Head Circumference      Peak Flow      Pain Score 0     Pain Loc      Pain Edu?      Excl. in [place]?    No data found.  Updated Vital Signs BP (!)  146/80 (BP Location: Left Arm)   Pulse 88   Temp 99.1 F (37.3 C) (Oral)   Resp 16   SpO2 95%   Visual Acuity Right Eye Distance:   Left Eye Distance:   Bilateral Distance:    Right Eye Near:   Left Eye Near:    Bilateral Near:     Physical Exam Vitals and nursing note reviewed.  Constitutional:  General: He is not in acute distress.    Appearance: Normal appearance. He is well-developed. He is not ill-appearing or toxic-appearing.     Comments: Hoarse voice  HENT:     Head: Normocephalic and atraumatic.     Right Ear: External ear normal.     Left Ear: External ear normal.     Nose: Nose normal. No congestion or rhinorrhea.     Mouth/Throat:     Mouth: Mucous membranes are moist.     Pharynx: Oropharynx is clear. No oropharyngeal exudate or posterior oropharyngeal erythema.  Eyes:     Extraocular Movements: Extraocular movements intact.     Conjunctiva/sclera: Conjunctivae normal.     Pupils: Pupils are equal, round, and reactive to light.  Cardiovascular:     Rate and Rhythm: Normal rate and regular rhythm.  Pulmonary:     Effort: Pulmonary effort is normal. No respiratory distress.     Breath sounds: Normal breath sounds. No stridor. No wheezing or rhonchi.  Abdominal:     Palpations: Abdomen is soft.     Tenderness: There is no abdominal tenderness.  Musculoskeletal:        General: No swelling.     Cervical back: Normal range of motion and neck supple. No rigidity or tenderness.  Lymphadenopathy:     Cervical: No cervical adenopathy.  Skin:    General: Skin is warm and dry.     Capillary Refill: Capillary refill takes less than 2 seconds.     Findings: No rash.  Neurological:     General: No focal deficit present.     Mental Status: He is alert and oriented to person, place, and time.  Psychiatric:        Mood and Affect: Mood normal.      UC Treatments / Results  Labs (all labs ordered are listed, but only abnormal results are displayed) Labs  Reviewed  POCT URINALYSIS DIP (MANUAL ENTRY) - Abnormal; Notable for the following components:      Result Value   Clarity, UA cloudy (*)    Blood, UA trace-lysed (*)    Protein Ur, POC trace (*)    Nitrite, UA Positive (*)    Leukocytes, UA Large (3+) (*)    All other components within normal limits  URINE CULTURE    EKG   Radiology No results found.  Procedures Procedures (including critical care time)  Medications Ordered in UC Medications  cefTRIAXone (ROCEPHIN) injection 1 g (1 g Intramuscular Given 12/31/21 1453)    Initial Impression / Assessment and Plan / UC Course  I have reviewed the triage vital signs and the nursing notes.  Pertinent labs & imaging results that were available during my care of the patient were reviewed by me and considered in my medical decision making (see chart for details).     Acute cystitis -patient is denying symptoms concerning for prostatitis or urosepsis.  Admits this feels similar to previous infections.  Patient is allergic to quinolones, therefore we will give him ceftriaxone injection and discharged home with Augmentin 500 mg twice daily x 7 days.  Urine culture sent out to confirm sensitivity Hoarseness -likely from injury or inflammation to recurrent laryngeal nerve from his recent carotid endarterectomy.  Follow-up with surgeon and/or PCP if symptoms persist. Lungs clear and no additional URI sx.   Final Clinical Impressions(s) / UC Diagnoses   Final diagnoses:  Acute cystitis without hematuria  Hoarseness     Discharge Instructions  Your symptoms are consistent with a urinary tract infection. Start taking the antibiotic twice daily until completed. We will send out your urine culture and only call if a change in treatment is necessary. Monitor for any change in or worsening symptoms; fever, hematuria or flank pain would warrant a recheck  Your voice changes are secondary to recurrent laryngeal nerve irritation from  your recent surgery.  Follow-up with your PCP for further recommendations should this become a chronic issue.    ED Prescriptions     Medication Sig Dispense Auth. Provider   amoxicillin-clavulanate (AUGMENTIN) 500-125 MG tablet Take 1 tablet by mouth 2 (two) times daily with a meal for 7 days. 14 tablet Kacia Halley L, [place]      PDMP not reviewed this encounter.   Chaney Malling, [place] 12/31/21 1455

## 2021-12-31 NOTE — ED Triage Notes (Signed)
Patient presents to UC for urinary freq, urgency since yesterday. Hx of UTIs. Takes flomax.

## 2022-01-02 ENCOUNTER — Encounter: Payer: Self-pay | Admitting: Surgery

## 2022-01-02 ENCOUNTER — Other Ambulatory Visit: Payer: Self-pay

## 2022-01-02 ENCOUNTER — Ambulatory Visit (INDEPENDENT_AMBULATORY_CARE_PROVIDER_SITE_OTHER): Payer: Medicare Other | Admitting: Surgery

## 2022-01-02 ENCOUNTER — Telehealth: Payer: Self-pay

## 2022-01-02 ENCOUNTER — Encounter (INDEPENDENT_AMBULATORY_CARE_PROVIDER_SITE_OTHER): Payer: Medicare Other | Admitting: Sports Medicine

## 2022-01-02 VITALS — BP 131/79 | HR 65 | Temp 98.0°F | Resp 16 | Ht 70.5 in | Wt 192.0 lb

## 2022-01-02 DIAGNOSIS — I7122 Aneurysm of the aortic arch, without rupture: Secondary | ICD-10-CM

## 2022-01-02 DIAGNOSIS — R319 Hematuria, unspecified: Secondary | ICD-10-CM

## 2022-01-02 DIAGNOSIS — N390 Urinary tract infection, site not specified: Secondary | ICD-10-CM

## 2022-01-02 LAB — URINE CULTURE: Culture: >=100000 — AB

## 2022-01-02 NOTE — H&P (View-Only) (Signed)
Patient name: Jason Abbott. MRN: [id number] DOB: 20-Apr-1945 Sex: male  REASON FOR VISIT:    Postop  HISTORY OF PRESENT ILLNESS:   Jason Abbott. is a 76 y.o. male who is status post right subclavian to common carotid artery transposition on 12/18/2021 in preparation for aortic arch reconstruction and thoracic stent graft.  His procedure was uncomplicated.  He went home on postop day #1.  He has complained of some hoarseness postoperatively which has not changed.  Other than that, he has no complaints.  CURRENT MEDICATIONS:    Current Outpatient Medications  Medication Sig Dispense Refill   amoxicillin-clavulanate (AUGMENTIN) 500-125 MG tablet Take 1 tablet by mouth 2 (two) times daily with a meal for 7 days. 14 tablet 0   aspirin EC 81 MG tablet Take 1 tablet (81 mg total) by mouth daily at 6 (six) AM. Swallow whole. 30 tablet 12   Budeson-Glycopyrrol-Formoterol (BREZTRI AEROSPHERE) 160-9-4.8 MCG/ACT AERO Inhale 2 puffs into the lungs 2 (two) times daily. (Patient taking differently: Inhale 2 puffs into the lungs daily.) 1 g 11   Cranberry-Vitamin C-Probiotic (AZO CRANBERRY PO) Take 1 each by mouth in the morning.     Cyanocobalamin (VITAMIN B 12 PO) Take 1 tablet by mouth in the morning.     ibuprofen (ADVIL) 200 MG tablet Take 600-800 mg by mouth daily as needed for headache or mild pain.     Multiple Vitamins-Minerals (MULTIVITAMIN GUMMIES ADULTS PO) Take 2 tablets by mouth in the morning.     tamsulosin (FLOMAX) 0.4 MG CAPS capsule Take 0.4 mg by mouth in the morning.     oxyCODONE-acetaminophen (PERCOCET/ROXICET) 5-325 MG tablet Take 1-2 tablets by mouth every 4 (four) hours as needed for moderate pain. (Patient not taking: Reported on 01/02/2022) 10 tablet 0   POTASSIUM CITRATE PO Take 1 tablet by mouth 2 (two) times daily.     No current facility-administered medications for this visit.    REVIEW OF SYSTEMS:   '[X]'$  denotes positive  finding, '[ ]'$  denotes negative finding Cardiac  Comments:  Chest pain or chest pressure:    Shortness of breath upon exertion:    Short of breath when lying flat:    Irregular heart rhythm:    Constitutional    Fever or chills:      PHYSICAL EXAM:   Vitals:   01/02/22 1207 01/02/22 1210  BP: (!) 140/86 131/79  Pulse: 64 65  Resp: 16   Temp: 98 F (36.7 C)   TempSrc: Temporal   SpO2: 98%   Weight: 192 lb (87.1 kg)   Height: 5' 10.5" (1.791 m)     GENERAL: The patient is a well-nourished male, in no acute distress. The vital signs are documented above. CARDIOVASCULAR: There is a regular rate and rhythm. PULMONARY: Non-labored respirations Incision has fully healed.  He has a palpable right brachial pulse  STUDIES:      MEDICAL ISSUES:   Status post right carotid subclavian: I suspect that the patient's hoarseness is secondary to vagus nerve irritation.  I told him that this should resolve with time.  He is scheduled to undergo aortic arch reconstruction and thoracic stent graft repair next week.  He has been complaining of a urinary tract infection which she gets intermittently.  He has been started on antibiotics.  I will have him come for his preoperative labs on Tuesday and have a repeat UA checked.  We will adjust antibiotics as needed.  If he has  persistent urinary tract infection, we may have to reschedule his surgery.  Hopefully this will resolve with antibiotics.  In the past, he has had to undergo multiple rounds of antibiotics to clear his infection.  [name], MD, FACS [facility] [phone] Pager [phone]

## 2022-01-02 NOTE — Telephone Encounter (Signed)
Spoke with patient/wife to inform that preop appointment has been moved up per Dr. Stephens Shire request to 01/07/22 at 0900 AM. Patient/wife verbalized understanding.

## 2022-01-02 NOTE — Progress Notes (Signed)
Patient name: Jason Abbott. MRN: [id number] DOB: 22-Apr-1945 Sex: male  REASON FOR VISIT:    Postop  HISTORY OF PRESENT ILLNESS:   Jason Abbott. is a 76 y.o. male who is status post right subclavian to common carotid artery transposition on 12/18/2021 in preparation for aortic arch reconstruction and thoracic stent graft.  His procedure was uncomplicated.  He went home on postop day #1.  He has complained of some hoarseness postoperatively which has not changed.  Other than that, he has no complaints.  CURRENT MEDICATIONS:    Current Outpatient Medications  Medication Sig Dispense Refill   amoxicillin-clavulanate (AUGMENTIN) 500-125 MG tablet Take 1 tablet by mouth 2 (two) times daily with a meal for 7 days. 14 tablet 0   aspirin EC 81 MG tablet Take 1 tablet (81 mg total) by mouth daily at 6 (six) AM. Swallow whole. 30 tablet 12   Budeson-Glycopyrrol-Formoterol (BREZTRI AEROSPHERE) 160-9-4.8 MCG/ACT AERO Inhale 2 puffs into the lungs 2 (two) times daily. (Patient taking differently: Inhale 2 puffs into the lungs daily.) 1 g 11   Cranberry-Vitamin C-Probiotic (AZO CRANBERRY PO) Take 1 each by mouth in the morning.     Cyanocobalamin (VITAMIN B 12 PO) Take 1 tablet by mouth in the morning.     ibuprofen (ADVIL) 200 MG tablet Take 600-800 mg by mouth daily as needed for headache or mild pain.     Multiple Vitamins-Minerals (MULTIVITAMIN GUMMIES ADULTS PO) Take 2 tablets by mouth in the morning.     tamsulosin (FLOMAX) 0.4 MG CAPS capsule Take 0.4 mg by mouth in the morning.     oxyCODONE-acetaminophen (PERCOCET/ROXICET) 5-325 MG tablet Take 1-2 tablets by mouth every 4 (four) hours as needed for moderate pain. (Patient not taking: Reported on 01/02/2022) 10 tablet 0   POTASSIUM CITRATE PO Take 1 tablet by mouth 2 (two) times daily.     No current facility-administered medications for this visit.    REVIEW OF SYSTEMS:   '[X]'$  denotes positive  finding, '[ ]'$  denotes negative finding Cardiac  Comments:  Chest pain or chest pressure:    Shortness of breath upon exertion:    Short of breath when lying flat:    Irregular heart rhythm:    Constitutional    Fever or chills:      PHYSICAL EXAM:   Vitals:   01/02/22 1207 01/02/22 1210  BP: (!) 140/86 131/79  Pulse: 64 65  Resp: 16   Temp: 98 F (36.7 C)   TempSrc: Temporal   SpO2: 98%   Weight: 192 lb (87.1 kg)   Height: 5' 10.5" (1.791 m)     GENERAL: The patient is a well-nourished male, in no acute distress. The vital signs are documented above. CARDIOVASCULAR: There is a regular rate and rhythm. PULMONARY: Non-labored respirations Incision has fully healed.  He has a palpable right brachial pulse  STUDIES:      MEDICAL ISSUES:   Status post right carotid subclavian: I suspect that the patient's hoarseness is secondary to vagus nerve irritation.  I told him that this should resolve with time.  He is scheduled to undergo aortic arch reconstruction and thoracic stent graft repair next week.  He has been complaining of a urinary tract infection which she gets intermittently.  He has been started on antibiotics.  I will have him come for his preoperative labs on Tuesday and have a repeat UA checked.  We will adjust antibiotics as needed.  If he has  persistent urinary tract infection, we may have to reschedule his surgery.  Hopefully this will resolve with antibiotics.  In the past, he has had to undergo multiple rounds of antibiotics to clear his infection.  [name], MD, FACS [facility] [phone] Pager [phone]

## 2022-01-03 ENCOUNTER — Ambulatory Visit: Payer: Medicare Other

## 2022-01-03 ENCOUNTER — Telehealth (HOSPITAL_COMMUNITY): Payer: Self-pay | Admitting: Emergency Medicine

## 2022-01-03 ENCOUNTER — Encounter: Payer: Self-pay | Admitting: Sports Medicine

## 2022-01-03 ENCOUNTER — Ambulatory Visit: Payer: Medicare Other | Admitting: Family Medicine

## 2022-01-03 DIAGNOSIS — N390 Urinary tract infection, site not specified: Secondary | ICD-10-CM | POA: Insufficient documentation

## 2022-01-03 MED ORDER — CEFDINIR 300 MG PO CAPS: 300.0000 mg | ORAL_CAPSULE | Freq: Two times a day (BID) | ORAL | 0 refills | Status: DC

## 2022-01-03 NOTE — Pre-Procedure Instructions (Signed)
Surgical Instructions    Your procedure is scheduled on Friday, January 5th.  Report to [facility] Main Entrance "A" at 05:30 A.M., then check in with the Admitting office.  Call this number if you have problems the morning of surgery:  [phone]  If you have any questions prior to your surgery date call [phone]: Open Monday-Friday 8am-4pm If you experience any cold or flu symptoms such as cough, fever, chills, shortness of breath, etc. between now and your scheduled surgery, please notify us at the above number.     Remember:  Do not eat or drink after midnight the night before your surgery     Take these medicines the morning of surgery with A SIP OF WATER  Budeson-Glycopyrrol-Formoterol (BREZTRI AEROSPHERE)  cefdinir (OMNICEF)  tamsulosin (FLOMAX)    Follow your surgeon's instructions on when to stop Aspirin.  If no instructions were given by your surgeon then you will need to call the office to get those instructions.     As of today, STOP taking any Aleve, Naproxen, Ibuprofen, Motrin, Advil, Goody's, BC's, all herbal medications, fish oil, and all vitamins.                     Do NOT Smoke (Tobacco/Vaping) for 24 hours prior to your procedure.  If you use a CPAP at night, you may bring your mask/headgear for your overnight stay.   Contacts, glasses, piercing's, hearing aid's, dentures or partials may not be worn into surgery, please bring cases for these belongings.    For patients admitted to the hospital, discharge time will be determined by your treatment team.   Patients discharged the day of surgery will not be allowed to drive home, and someone needs to stay with them for 24 hours.  SURGICAL WAITING ROOM VISITATION Patients having surgery or a procedure may have no more than 2 support people in the waiting area - these visitors may rotate.   Children under the age of 59 must have an adult with them who is not the patient. If the patient needs to stay at the  hospital during part of their recovery, the visitor guidelines for inpatient rooms apply. Pre-op nurse will coordinate an appropriate time for 1 support person to accompany patient in pre-op.  This support person may not rotate.   Please refer to the [facility] website for the visitor guidelines for Inpatients (after your surgery is over and you are in a regular room).    Special instructions:   [place]- Preparing For Surgery  Before surgery, you can play an important role. Because skin is not sterile, your skin needs to be as free of germs as possible. You can reduce the number of germs on your skin by washing with CHG (chlorahexidine gluconate) Soap before surgery.  CHG is an antiseptic cleaner which kills germs and bonds with the skin to continue killing germs even after washing.    Oral Hygiene is also important to reduce your risk of infection.  Remember - BRUSH YOUR TEETH THE MORNING OF SURGERY WITH YOUR REGULAR TOOTHPASTE  Please do not use if you have an allergy to CHG or antibacterial soaps. If your skin becomes reddened/irritated stop using the CHG.  Do not shave (including legs and underarms) for at least 48 hours prior to first CHG shower. It is OK to shave your face.  Please follow these instructions carefully.   Shower the NIGHT BEFORE SURGERY and the MORNING OF SURGERY  If you chose  to wash your hair, wash your hair first as usual with your normal shampoo.  After you shampoo, rinse your hair and body thoroughly to remove the shampoo.  Use CHG Soap as you would any other liquid soap. You can apply CHG directly to the skin and wash gently with a scrungie or a clean washcloth.   Apply the CHG Soap to your body ONLY FROM THE NECK DOWN.  Do not use on open wounds or open sores. Avoid contact with your eyes, ears, mouth and genitals (private parts). Wash Face and genitals (private parts)  with your normal soap.   Wash thoroughly, paying special attention to the area where  your surgery will be performed.  Thoroughly rinse your body with warm water from the neck down.  DO NOT shower/wash with your normal soap after using and rinsing off the CHG Soap.  Pat yourself dry with a CLEAN TOWEL.  Wear CLEAN PAJAMAS to bed the night before surgery  Place CLEAN SHEETS on your bed the night before your surgery  DO NOT SLEEP WITH PETS.   Day of Surgery: Take a shower with CHG soap. Do not wear jewelry  Do not wear lotions, powders, colognes, or deodorant. Men may shave face and neck. Do not bring valuables to the hospital. [facility] is not responsible for any belongings or valuables.  Wear Clean/Comfortable clothing the morning of surgery Remember to brush your teeth WITH YOUR REGULAR TOOTHPASTE.   Please read over the following fact sheets that you were given.    If you received a COVID test during your pre-op visit  it is requested that you wear a mask when out in public, stay away from anyone that may not be feeling well and notify your surgeon if you develop symptoms. If you have been in contact with anyone that has tested positive in the last 10 days please notify you surgeon.

## 2022-01-03 NOTE — Assessment & Plan Note (Signed)
Recent UTI, culture grew out Citrobacter, relatively pansensitive, he was prescribed Augmentin though I see there is likely can to be a better MIC with using a third generation cephalosporin or a fluoroquinolone, he is allergic to Levaquin so we will switch to a third-generation cephalosporin, Omnicef for now.

## 2022-01-03 NOTE — Telephone Encounter (Signed)
I spent 5 total minutes of online digital evaluation and management services in this patient-initiated request for online care. 

## 2022-01-07 ENCOUNTER — Encounter (HOSPITAL_COMMUNITY)
Admission: RE | Admit: 2022-01-07 | Discharge: 2022-01-07 | Disposition: A | Discharge status: Home / Self Care | Payer: Medicare Other | Source: Ambulatory Visit | Attending: Surgery | Admitting: Surgery

## 2022-01-07 ENCOUNTER — Ambulatory Visit (HOSPITAL_COMMUNITY)
Admission: RE | Admit: 2022-01-07 | Discharge: 2022-01-07 | Disposition: A | Discharge status: Home / Self Care | Payer: Medicare Other | Source: Ambulatory Visit | Attending: Surgery | Admitting: Surgery

## 2022-01-07 ENCOUNTER — Other Ambulatory Visit: Payer: Self-pay

## 2022-01-07 VITALS — BP 136/67 | HR 65 | Temp 97.5°F | Resp 18 | Ht 70.0 in | Wt 194.7 lb

## 2022-01-07 DIAGNOSIS — J9811 Atelectasis: Secondary | ICD-10-CM | POA: Diagnosis not present

## 2022-01-07 DIAGNOSIS — R531 Weakness: Secondary | ICD-10-CM | POA: Diagnosis not present

## 2022-01-07 DIAGNOSIS — I7122 Aneurysm of the aortic arch, without rupture: Secondary | ICD-10-CM

## 2022-01-07 DIAGNOSIS — Z1152 Encounter for screening for COVID-19: Secondary | ICD-10-CM | POA: Insufficient documentation

## 2022-01-07 DIAGNOSIS — R079 Chest pain, unspecified: Secondary | ICD-10-CM | POA: Diagnosis not present

## 2022-01-07 DIAGNOSIS — R7989 Other specified abnormal findings of blood chemistry: Secondary | ICD-10-CM | POA: Insufficient documentation

## 2022-01-07 DIAGNOSIS — J90 Pleural effusion, not elsewhere classified: Secondary | ICD-10-CM | POA: Diagnosis not present

## 2022-01-07 DIAGNOSIS — Z01818 Encounter for other preprocedural examination: Secondary | ICD-10-CM | POA: Insufficient documentation

## 2022-01-07 DIAGNOSIS — R739 Hyperglycemia, unspecified: Secondary | ICD-10-CM | POA: Diagnosis not present

## 2022-01-07 DIAGNOSIS — R5383 Other fatigue: Secondary | ICD-10-CM | POA: Diagnosis not present

## 2022-01-07 DIAGNOSIS — Z809 Family history of malignant neoplasm, unspecified: Secondary | ICD-10-CM | POA: Diagnosis not present

## 2022-01-07 DIAGNOSIS — I712 Thoracic aortic aneurysm, without rupture, unspecified: Secondary | ICD-10-CM | POA: Diagnosis not present

## 2022-01-07 DIAGNOSIS — C9110 Chronic lymphocytic leukemia of B-cell type not having achieved remission: Secondary | ICD-10-CM | POA: Diagnosis not present

## 2022-01-07 DIAGNOSIS — M7989 Other specified soft tissue disorders: Secondary | ICD-10-CM | POA: Diagnosis not present

## 2022-01-07 DIAGNOSIS — Z8673 Personal history of transient ischemic attack (TIA), and cerebral infarction without residual deficits: Secondary | ICD-10-CM | POA: Diagnosis not present

## 2022-01-07 DIAGNOSIS — Z87891 Personal history of nicotine dependence: Secondary | ICD-10-CM | POA: Diagnosis not present

## 2022-01-07 DIAGNOSIS — I4891 Unspecified atrial fibrillation: Secondary | ICD-10-CM | POA: Diagnosis not present

## 2022-01-07 DIAGNOSIS — D696 Thrombocytopenia, unspecified: Secondary | ICD-10-CM | POA: Diagnosis not present

## 2022-01-07 DIAGNOSIS — E871 Hypo-osmolality and hyponatremia: Secondary | ICD-10-CM | POA: Diagnosis not present

## 2022-01-07 DIAGNOSIS — I2510 Atherosclerotic heart disease of native coronary artery without angina pectoris: Secondary | ICD-10-CM | POA: Diagnosis not present

## 2022-01-07 DIAGNOSIS — Z833 Family history of diabetes mellitus: Secondary | ICD-10-CM | POA: Diagnosis not present

## 2022-01-07 DIAGNOSIS — E8770 Fluid overload, unspecified: Secondary | ICD-10-CM | POA: Diagnosis not present

## 2022-01-07 DIAGNOSIS — J189 Pneumonia, unspecified organism: Secondary | ICD-10-CM | POA: Diagnosis not present

## 2022-01-07 DIAGNOSIS — I739 Peripheral vascular disease, unspecified: Secondary | ICD-10-CM | POA: Diagnosis not present

## 2022-01-07 DIAGNOSIS — Z825 Family history of asthma and other chronic lower respiratory diseases: Secondary | ICD-10-CM | POA: Diagnosis not present

## 2022-01-07 DIAGNOSIS — Z5189 Encounter for other specified aftercare: Secondary | ICD-10-CM

## 2022-01-07 DIAGNOSIS — D62 Acute posthemorrhagic anemia: Secondary | ICD-10-CM | POA: Diagnosis not present

## 2022-01-07 DIAGNOSIS — I4510 Unspecified right bundle-branch block: Secondary | ICD-10-CM | POA: Diagnosis not present

## 2022-01-07 DIAGNOSIS — Z8616 Personal history of COVID-19: Secondary | ICD-10-CM | POA: Diagnosis not present

## 2022-01-07 DIAGNOSIS — K219 Gastro-esophageal reflux disease without esophagitis: Secondary | ICD-10-CM | POA: Diagnosis not present

## 2022-01-07 DIAGNOSIS — N400 Enlarged prostate without lower urinary tract symptoms: Secondary | ICD-10-CM | POA: Diagnosis present

## 2022-01-07 DIAGNOSIS — E782 Mixed hyperlipidemia: Secondary | ICD-10-CM | POA: Diagnosis present

## 2022-01-07 DIAGNOSIS — I088 Other rheumatic multiple valve diseases: Secondary | ICD-10-CM | POA: Diagnosis not present

## 2022-01-07 LAB — URINALYSIS, ROUTINE W REFLEX MICROSCOPIC
Bacteria, UA: NONE SEEN
Bilirubin Urine: NEGATIVE
Glucose, UA: NEGATIVE mg/dL
Hgb urine dipstick: NEGATIVE
Ketones, ur: NEGATIVE mg/dL
Nitrite: NEGATIVE
Protein, ur: NEGATIVE mg/dL
Specific Gravity, Urine: 1.012 (ref 1.005–1.030)
pH: 5 (ref 5.0–8.0)

## 2022-01-07 LAB — COMPREHENSIVE METABOLIC PANEL
ALT: 15 U/L (ref 0–44)
AST: 22 U/L (ref 15–41)
Albumin: 3.8 g/dL (ref 3.5–5.0)
Alkaline Phosphatase: 78 U/L (ref 38–126)
Anion gap: 5 (ref 5–15)
BUN: 11 mg/dL (ref 8–23)
CO2: 26 mmol/L (ref 22–32)
Calcium: 9.1 mg/dL (ref 8.9–10.3)
Chloride: 104 mmol/L (ref 98–111)
Creatinine, Ser: 0.96 mg/dL (ref 0.61–1.24)
GFR, Estimated: >60 mL/min (ref >60)
Glucose, Bld: 86 mg/dL (ref 70–99)
Potassium: 4.3 mmol/L (ref 3.5–5.1)
Sodium: 135 mmol/L (ref 135–145)
Total Bilirubin: 0.7 mg/dL (ref 0.3–1.2)
Total Protein: 6.1 g/dL — ABNORMAL LOW (ref 6.5–8.1)

## 2022-01-07 LAB — SURGICAL PCR SCREEN
MRSA, PCR: NEGATIVE
Staphylococcus aureus: NEGATIVE

## 2022-01-07 LAB — HEMOGLOBIN A1C
Hgb A1c MFr Bld: 5.4 % (ref 4.8–5.6)
Mean Plasma Glucose: 108 mg/dL

## 2022-01-07 LAB — CBC
HCT: 40.7 % (ref 39.0–52.0)
Hemoglobin: 13.3 g/dL (ref 13.0–17.0)
MCH: 32.3 pg (ref 26.0–34.0)
MCHC: 32.7 g/dL (ref 30.0–36.0)
MCV: 98.8 fL (ref 80.0–100.0)
Platelets: 155 10*3/uL (ref 150–400)
RBC: 4.12 MIL/uL — ABNORMAL LOW (ref 4.22–5.81)
RDW: 12.5 % (ref 11.5–15.5)
WBC: 30.8 10*3/uL — ABNORMAL HIGH (ref 4.0–10.5)
nRBC: 0 % (ref 0.0–0.2)

## 2022-01-07 LAB — SARS CORONAVIRUS 2 (TAT 6-24 HRS): SARS Coronavirus 2: NEGATIVE

## 2022-01-07 LAB — APTT: aPTT: 31 seconds (ref 24–36)

## 2022-01-07 LAB — PROTIME-INR
INR: 1.1 (ref 0.8–1.2)
Prothrombin Time: 13.6 seconds (ref 11.4–15.2)

## 2022-01-07 NOTE — Progress Notes (Signed)
PCP - Dr. Dianah Field  Chest x-ray - 01/07/22 EKG - 01/07/22 ECHO - 09/16/21   COVID TEST- today   Anesthesia review: yes -  Patient denies shortness of breath, fever, cough and chest pain at PAT appointment   All instructions explained to the patient, with a verbal understanding of the material. Patient agrees to go over the instructions while at home for a better understanding. Patient also instructed to self quarantine after being tested for COVID-19. The opportunity to ask questions was provided.

## 2022-01-07 NOTE — Pre-Procedure Instructions (Addendum)
Surgical Instructions   Your procedure is scheduled on Friday, 01/10/2021 at 7:30 AM.   Report to [facility] Main Entrance "A" at 5:30 AM, then check in with the Admitting office.   Call this number if you have problems the morning of surgery:  [phone]  If you have any questions prior to your surgery date call [phone]: Open Monday-Friday 8am-4pm If you experience any cold or flu symptoms such as cough, fever, chills, shortness of breath, etc. between now and your scheduled surgery, please notify us at the above number.    Remember:  Do not eat or drink after midnight the night before your surgery  Take these medicines the morning of surgery with A SIP OF WATER: Aspirin, Tamsulosin (Flomax), Budeson-Glycopyrrol-Formoterol (Breztri) inhaler  As of today, STOP taking any Aleve, Naproxen, Ibuprofen, Motrin, Advil, Goody's, BC's, all herbal medications, fish oil, and all vitamins.          Contacts, glasses, piercing's, hearing aid's, dentures or partials may not be worn into surgery, please bring cases for these belongings.    For patients admitted to the hospital, discharge time will be determined by your treatment team.  [place] VISITATION Patients having surgery or a procedure may have no more than 2 support people in the waiting area - these visitors may rotate.   Children under the age of 4 must have an adult with them who is not the patient. If the patient needs to stay at the hospital during part of their recovery, the visitor guidelines for inpatient rooms apply. Pre-op nurse will coordinate an appropriate time for 1 support person to accompany patient in pre-op.  This support person may not rotate.   Please refer to the [facility] website for the visitor guidelines for Inpatients (after your surgery is over and you are in a regular room).   Special instructions:   [place]- Preparing For Surgery  Before surgery, you can play an important role. Because  skin is not sterile, your skin needs to be as free of germs as possible. You can reduce the number of germs on your skin by washing with CHG (chlorahexidine gluconate) Soap before surgery.  CHG is an antiseptic cleaner which kills germs and bonds with the skin to continue killing germs even after washing.    Oral Hygiene is also important to reduce your risk of infection.  Remember - BRUSH YOUR TEETH THE MORNING OF SURGERY WITH YOUR REGULAR TOOTHPASTE  Please do not use if you have an allergy to CHG or antibacterial soaps. If your skin becomes reddened/irritated stop using the CHG.  Do not shave (including legs and underarms) for at least 48 hours prior to first CHG shower. It is OK to shave your face.  Please follow these instructions carefully.   Shower the NIGHT BEFORE SURGERY and the MORNING OF SURGERY  If you chose to wash your hair, wash your hair first as usual with your normal shampoo.  After you shampoo, rinse your hair and body thoroughly to remove the shampoo.  Use CHG Soap as you would any other liquid soap. You can apply CHG directly to the skin and wash gently with a scrungie or a clean washcloth.   Apply the CHG Soap to your body ONLY FROM THE NECK DOWN.  Do not use on open wounds or open sores. Avoid contact with your eyes, ears, mouth and genitals (private parts). Wash Face and genitals (private parts)  with your normal soap.   Wash thoroughly, paying  special attention to the area where your surgery will be performed.  Thoroughly rinse your body with warm water from the neck down.  DO NOT shower/wash with your normal soap after using and rinsing off the CHG Soap.  Pat yourself dry with a CLEAN TOWEL.  Wear CLEAN PAJAMAS to bed the night before surgery  Place CLEAN SHEETS on your bed the night before your surgery  DO NOT SLEEP WITH PETS.  Day of Surgery: Take a shower with CHG soap. Do not wear jewelry  Do not wear lotions, powders, colognes, or deodorant. Men may  shave face and neck. Do not bring valuables to the hospital. [facility] is not responsible for any belongings or valuables.  Wear Clean/Comfortable clothing the morning of surgery Remember to brush your teeth WITH YOUR REGULAR TOOTHPASTE.   Please read over the fact sheets that you were given.  If you received a COVID test during your pre-op visit  it is requested that you wear a mask when out in public, stay away from anyone that may not be feeling well and notify your surgeon if you develop symptoms. If you have been in contact with anyone that has tested positive in the last 10 days please notify you surgeon.

## 2022-01-08 ENCOUNTER — Other Ambulatory Visit (HOSPITAL_COMMUNITY): Payer: Medicare Other

## 2022-01-09 MED ORDER — TRANEXAMIC ACID (OHS) PUMP PRIME SOLUTION
2.0000 mg/kg | INTRAVENOUS | Status: DC
  Filled 2022-01-09: qty 1.77

## 2022-01-09 MED ORDER — TRANEXAMIC ACID 1000 MG/10ML IV SOLN
1.5000 mg/kg/h | INTRAVENOUS | Status: DC
  Filled 2022-01-09: qty 25

## 2022-01-09 MED ORDER — MILRINONE LACTATE IN DEXTROSE 20-5 MG/100ML-% IV SOLN
0.3000 ug/kg/min | INTRAVENOUS | Status: DC
  Filled 2022-01-09: qty 100

## 2022-01-09 MED ORDER — MANNITOL 20 % IV SOLN
INTRAVENOUS | Status: DC
  Filled 2022-01-09: qty 13

## 2022-01-09 MED ORDER — VANCOMYCIN HCL 1500 MG/300ML IV SOLN
1500.0000 mg | INTRAVENOUS | Status: AC
  Administered 2022-01-10: 1500 mg via INTRAVENOUS
  Filled 2022-01-09: qty 300

## 2022-01-09 MED ORDER — INSULIN REGULAR(HUMAN) IN NACL 100-0.9 UT/100ML-% IV SOLN
INTRAVENOUS | Status: DC
  Filled 2022-01-09: qty 100

## 2022-01-09 MED ORDER — CEFAZOLIN SODIUM-DEXTROSE 2-4 GM/100ML-% IV SOLN
2.0000 g | INTRAVENOUS | Status: AC
  Administered 2022-01-10: 2 g via INTRAVENOUS
  Filled 2022-01-09: qty 100

## 2022-01-09 MED ORDER — EPINEPHRINE HCL 5 MG/250ML IV SOLN IN NS
0.0000 ug/min | INTRAVENOUS | Status: DC
  Filled 2022-01-09: qty 250

## 2022-01-09 MED ORDER — POTASSIUM CHLORIDE 2 MEQ/ML IV SOLN
80.0000 meq | INTRAVENOUS | Status: DC
  Filled 2022-01-09: qty 40

## 2022-01-09 MED ORDER — NOREPINEPHRINE 4 MG/250ML-% IV SOLN
0.0000 ug/min | INTRAVENOUS | Status: AC
  Administered 2022-01-10: 5 ug/min via INTRAVENOUS
  Filled 2022-01-09: qty 250

## 2022-01-09 MED ORDER — HEPARIN 30,000 UNITS/1000 ML (OHS) CELLSAVER SOLUTION
Status: DC
  Filled 2022-01-09: qty 1000

## 2022-01-09 MED ORDER — TRANEXAMIC ACID (OHS) BOLUS VIA INFUSION
15.0000 mg/kg | INTRAVENOUS | Status: AC
  Administered 2022-01-10: 1324.5 mg via INTRAVENOUS
  Filled 2022-01-09: qty 1325

## 2022-01-09 MED ORDER — PLASMA-LYTE A IV SOLN
INTRAVENOUS | Status: DC
  Filled 2022-01-09: qty 2.5

## 2022-01-09 MED ORDER — PHENYLEPHRINE HCL-NACL 20-0.9 MG/250ML-% IV SOLN
30.0000 ug/min | INTRAVENOUS | Status: AC
  Administered 2022-01-10: 25 ug/min via INTRAVENOUS
  Filled 2022-01-09: qty 250

## 2022-01-09 MED ORDER — DEXMEDETOMIDINE HCL IN NACL 400 MCG/100ML IV SOLN
0.1000 ug/kg/h | INTRAVENOUS | Status: AC
  Administered 2022-01-10: .5 ug/kg/h via INTRAVENOUS
  Filled 2022-01-09: qty 100

## 2022-01-09 NOTE — Anesthesia Preprocedure Evaluation (Addendum)
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, aberrant R Ragan artery)   Rhythm:Regular Rate:Normal  09/2021 ECHO:  EF 55-60%, LVF normal, RVF normal, no significant valvular abnormalities, 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   Neuro/Psych  Headaches    GI/Hepatic Neg liver ROS,GERD  Controlled,,  Endo/Other  negative endocrine ROS    Renal/GU negative Renal ROS     Musculoskeletal   Abdominal   Peds  Hematology CLL   Anesthesia Other Findings   Reproductive/Obstetrics                              Anesthesia Physical Anesthesia Plan  ASA: 4  Anesthesia Plan: General   Post-op Pain Management: Tylenol PO (pre-op)*   Induction: Intravenous  PONV Risk Score and Plan: 2 and Ondansetron and Dexamethasone  Airway Management Planned: Oral ETT  Additional Equipment: Arterial line, CVP, TEE and Ultrasound Guidance Line Placement  Intra-op Plan:   Post-operative Plan: Possible Post-op intubation/ventilation  Informed Consent: I have reviewed the patients History and Physical, chart, labs and discussed the procedure including the risks, benefits and alternatives for the proposed anesthesia with the patient or authorized representative who has indicated his/her understanding and acceptance.     Dental advisory given  Plan  Discussed with: CRNA and Surgeon  Anesthesia Plan Comments:          Anesthesia Quick Evaluation

## 2022-01-09 NOTE — H&P (Signed)
[address]       [place] [address]             [phone]      Cardiothoracic Surgery Admission History and Physical   PCP is Dianah Field, Gwen Her, MD Referring Provider is Serafina Mitchell, MD   Chief complaint: aortic arch aneurysm     HPI:   The patient is a 77 year old gentleman with a history of stable CLL being followed by Dr. 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  It was measured at 4.9 cm at that time.  He recently had a follow-up CT scan that showed an increase in the size of the aneurysm to 6 cm.  He has been followed by Dr. Trula Slade who recommended aortic arch reconstruction and TEVAR.  He was admitted to the hospital on 09/09/2021 with hypoxic respiratory failure and pneumonia.  He has recovered from this.  He recently underwent surgery for right carpal tunnel due to severe pain and inability to use his right hand.  He is making a good recovery from that. He then underwent right subclavian to common carotid artery transposition by Dr. Trula Slade on 12/18/2021 in preparation for aneurysm repair.   He is married and lives with his wife.  He has retired.  He remains very active and plays accordion regularly at various gigs.     Past Medical History:  Diagnosis Date   Aberrant right subclavian artery     Aortic arch aneurysm (HCC)     Arthritis     Bowel obstruction (HCC)     CLL (chronic lymphocytic leukemia) ([facility])     COVID 2021    hospitalized for it   Diverticul disease small and large intestine, no perforati or abscess     GERD (gastroesophageal reflux disease)      uses tums as needed   H/O urinary infection     Pneumonia     Shingles outbreak 10/03/2013   Stroke ([facility]) 01/2019           Past Surgical History:  Procedure Laterality Date   CARPAL TUNNEL RELEASE Right  11/25/2021    Procedure: CARPAL TUNNEL RELEASE;  Surgeon: Sherilyn Cooter, MD;  Location: [facility];  Service: Orthopedics;  Laterality: Right;  or MAC with regional block 60   HERNIA REPAIR       MECKEL DIVERTICULUM EXCISION       ORIF FINGER / THUMB FRACTURE       TENOSYNOVECTOMY Right 11/25/2021    Procedure: FLEXOR TENOSYNOVECTOMY;  Surgeon: Sherilyn Cooter, MD;  Location: [facility];  Service: Orthopedics;  Laterality: Right;  or MAC with regional block 60   ulna nerve surgery               Family History  Problem Relation Age of Onset   Cancer Mother          pancreatic   COPD Father     Diabetes Sister     Diabetes Brother     Diabetes Brother        Social History Social History         Tobacco Use   Smoking status: Former      Packs/day: 0.50      Years: 23.00      Total pack years: 11.50  Types: Cigarettes      Quit date: 14      Years since quitting: 34.9   Smokeless tobacco: Never  Vaping Use   Vaping Use: Never used  Substance Use Topics   Alcohol use: Not Currently      Comment: rarely   Drug use: No            Current Outpatient Medications  Medication Sig Dispense Refill   Budeson-Glycopyrrol-Formoterol (BREZTRI AEROSPHERE) 160-9-4.8 MCG/ACT AERO Inhale 2 puffs into the lungs 2 (two) times daily. 1 g 11   Cranberry-Vitamin C-Probiotic (AZO CRANBERRY PO) Take by mouth.       Cyanocobalamin (VITAMIN B 12 PO) Take 1 tablet by mouth daily.       Magnesium Citrate 200 MG TABS Take 400 mg by mouth daily.       Multiple Vitamins-Minerals (MULTIVITAMIN GUMMIES ADULTS PO) Take 2 tablets by mouth daily.       tamsulosin (FLOMAX) 0.4 MG CAPS capsule Take 0.4 mg by mouth daily.        No current facility-administered medications for this visit.           Allergies  Allergen Reactions   Levaquin [Levofloxacin] Other (See Comments)      Due to medical hx      Review of Systems  Constitutional:  Negative for activity change and fatigue.  HENT:  Negative.    Eyes: Negative.   Respiratory:  Negative for chest tightness and shortness of breath.   Cardiovascular:  Negative for chest pain and leg swelling.  Gastrointestinal: Negative.   Endocrine: Negative.   Genitourinary: Negative.   Musculoskeletal:        Leg cramps  Skin: Negative.   Allergic/Immunologic: Negative.   Neurological:  Negative for dizziness and syncope.  Hematological: Negative.   Psychiatric/Behavioral: Negative.        BP (!) 120/52 (BP Location: Right Arm, Patient Position: Sitting)   Pulse 66   Resp 20   Ht _0  (1.778 m)   Wt 186 lb (84.4 kg)   SpO2 91% Comment: RA  BMI 26.69 kg/m  Physical Exam Constitutional:      Appearance: Normal appearance. He is normal weight.  HENT:     Mouth/Throat:     Mouth: Mucous membranes are moist.     Pharynx: Oropharynx is clear.  Eyes:     Extraocular Movements: Extraocular movements intact.     Conjunctiva/sclera: Conjunctivae normal.     Pupils: Pupils are equal, round, and reactive to light.  Neck:     Vascular: No carotid bruit.  Cardiovascular:     Rate and Rhythm: Normal rate and regular rhythm.     Pulses: Normal pulses.     Heart sounds: Normal heart sounds. No murmur heard. Pulmonary:     Effort: Pulmonary effort is normal.     Breath sounds: Normal breath sounds.  Abdominal:     General: Abdomen is flat.     Palpations: Abdomen is soft.     Tenderness: There is no abdominal tenderness.  Musculoskeletal:        General: No swelling.     Cervical back: Normal range of motion and neck supple.  Lymphadenopathy:     Cervical: No cervical adenopathy.  Skin:    General: Skin is warm and dry.  Neurological:     General: No focal deficit present.     Mental Status: He is alert and oriented to person, place, and time.  Psychiatric:  Mood and Affect: Mood normal.        Behavior: Behavior normal.          Diagnostic Tests:   ECHOCARDIOGRAM REPORT       Patient Name:   Jason Abbott. Date of Exam: 09/11/2021  Medical Rec #:  [id number]           Height:       71.0 in  Accession #:    [id number]          Weight:       178.6 lb  Date of Birth:  11-17-1945           BSA:          2.009 m  Patient Age:    59 years            BP:           124/86 mmHg  Patient Gender: M                   HR:           76 bpm.  Exam Location:  Inpatient   Procedure: 2D Echo   Indications:    Aortic Arch Aneursym    History:        Patient has prior history of Echocardiogram examinations,  most                 recent 09/03/2021. Stroke, Signs/Symptoms:Shortness of  Breath;                 Risk Factors:Dyslipidemia.    Sonographer:    Harvie Junior  Referring Phys: Lynn     1. Left ventricular ejection fraction, by estimation, is 55 to 60%. The  left ventricle has normal function. The left ventricle has no regional  wall motion abnormalities. Left ventricular diastolic parameters are  indeterminate.   2. Right ventricular systolic function is normal. The right ventricular  size is mildly enlarged. There is normal pulmonary artery systolic  pressure.   3. Right atrial size was severely dilated.   4. The mitral valve is normal in structure. No evidence of mitral valve  regurgitation. No evidence of mitral stenosis.   5. The aortic valve is tricuspid. Aortic valve regurgitation is not  visualized. Aortic valve sclerosis is present, with no evidence of aortic  valve stenosis.   6. There is moderate dilatation of the aortic root, measuring 43 mm.   7. The inferior vena cava is normal in size with greater than 50%  respiratory variability, suggesting right atrial pressure of 3 mmHg.   FINDINGS   Left Ventricle: Left ventricular ejection fraction, by estimation, is 55  to 60%. The left ventricle has normal function. The left ventricle has no  regional wall motion abnormalities. The left ventricular internal cavity  size was normal in size. There is    no left ventricular hypertrophy. Left ventricular diastolic parameters  are indeterminate.   Right Ventricle: The right ventricular size is mildly enlarged. No  increase in right ventricular wall thickness. Right ventricular systolic  function is normal. There is normal pulmonary artery systolic pressure.  The tricuspid regurgitant velocity is 2.28   m/s, and with an assumed right atrial pressure of 3 mmHg, the estimated  right ventricular systolic pressure is 53.6 mmHg.   Left Atrium: Left atrial size was normal in size.   Right Atrium: Right atrial size was severely dilated.  Pericardium: There is no evidence of pericardial effusion. Presence of  epicardial fat layer.   Mitral Valve: The mitral valve is normal in structure. No evidence of  mitral valve regurgitation. No evidence of mitral valve stenosis.   Tricuspid Valve: The tricuspid valve is normal in structure. Tricuspid  valve regurgitation is not demonstrated. No evidence of tricuspid  stenosis.   Aortic Valve: The aortic valve is tricuspid. Aortic valve regurgitation is  not visualized. Aortic valve sclerosis is present, with no evidence of  aortic valve stenosis. Aortic valve mean gradient measures 3.5 mmHg.  Aortic valve peak gradient measures 5.6   mmHg. Aortic valve area, by VTI measures 2.92 cm.   Pulmonic Valve: The pulmonic valve was normal in structure. Pulmonic valve  regurgitation is not visualized. No evidence of pulmonic stenosis.   Aorta: There is moderate dilatation of the aortic root, measuring 43 mm.   Venous: The inferior vena cava is normal in size with greater than 50%  respiratory variability, suggesting right atrial pressure of 3 mmHg.   IAS/Shunts: No atrial level shunt detected by color flow Doppler.     LEFT VENTRICLE  PLAX 2D  LVIDd:         5.20 cm      Diastology  LVIDs:         3.60 cm      LV e' medial:    7.18 cm/s  LV PW:         1.00 cm      LV E/e' medial:  6.6  LV IVS:         0.90 cm      LV e' lateral:   10.20 cm/s  LVOT diam:     2.30 cm      LV E/e' lateral: 4.6  LV SV:         61  LV SV Index:   30  LVOT Area:     4.15 cm    LV Volumes (MOD)  LV vol d, MOD A2C: 85.4 ml  LV vol d, MOD A4C: 106.0 ml  LV vol s, MOD A2C: 36.8 ml  LV vol s, MOD A4C: 46.9 ml  LV SV MOD A2C:     48.6 ml  LV SV MOD A4C:     106.0 ml  LV SV MOD BP:      60.6 ml   RIGHT VENTRICLE  RV Basal diam:  4.40 cm  RV Mid diam:    4.00 cm  RV S prime:     28.80 cm/s  TAPSE (M-mode): 3.0 cm   LEFT ATRIUM             Index        RIGHT ATRIUM           Index  LA diam:        3.70 cm 1.84 cm/m   RA Area:     31.80 cm  LA Vol (A2C):   48.2 ml 23.99 ml/m  RA Volume:   118.00 ml 58.72 ml/m  LA Vol (A4C):   53.6 ml 26.67 ml/m  LA Biplane Vol: 52.6 ml 26.18 ml/m   AORTIC VALVE                    PULMONIC VALVE  AV Area (Vmax):    2.67 cm     PV Vmax:          1.14 m/s  AV Area (Vmean):   2.58 cm  PV Peak grad:     5.2 mmHg  AV Area (VTI):     2.92 cm     PR End Diast Vel: 2.84 msec  AV Vmax:           118.50 cm/s  AV Vmean:          83.800 cm/s  AV VTI:            0.208 m  AV Peak Grad:      5.6 mmHg  AV Mean Grad:      3.5 mmHg  LVOT Vmax:         76.20 cm/s  LVOT Vmean:        52.100 cm/s  LVOT VTI:          0.146 m  LVOT/AV VTI ratio: 0.70    AORTA  Ao Root diam: 4.30 cm  Ao Asc diam:  3.30 cm   MITRAL VALVE               TRICUSPID VALVE  MV Area (PHT): 6.07 cm    TR Peak grad:   20.8 mmHg  MV Decel Time: 125 msec    TR Vmax:        228.00 cm/s  MR Peak grad: 11.6 mmHg  MR Vmax:      170.00 cm/s  SHUNTS  MV E velocity: 47.10 cm/s  Systemic VTI:  0.15 m  MV A velocity: 70.80 cm/s  Systemic Diam: 2.30 cm  MV E/A ratio:  0.67   Kardie Tobb DO  Electronically signed by Berniece Salines DO  Signature Date/Time: 09/11/2021/4:02:44 PM        Final         ADDENDUM REPORT: 09/23/2021 08:10   CLINICAL DATA:  This over-read does not include interpretation  of cardiac or coronary anatomy or pathology. The coronary CTA interpretation by the cardiologist is attached.   COMPARISON:  None available.   FINDINGS: No suspicious nodules, masses, or infiltrates are identified in the visualized portion of the lungs. No pleural fluid seen.   The visualized portions of the mediastinum and chest wall are unremarkable. Multiple cysts are noted in the visualized portion of the upper liver.   IMPRESSION: No significant non-cardiac abnormality identified.     Electronically Signed   By: Marlaine Hind M.D.   On: 09/23/2021 08:10    Addended by Earle Gell, MD on 09/23/2021  8:12 AM    Study Result   Narrative & Impression  HISTORY: Thoracic aortic aneurysm, Preop   EXAM: Cardiac/Coronary  CT   TECHNIQUE: The patient was scanned on a [employer].   PROTOCOL: A 120 kV prospective scan was triggered in the descending thoracic aorta at 111 HU's. Axial non-contrast 3 mm slices were carried out through the heart. The data set was analyzed on a dedicated work station and scored using the Agatston method. Gantry rotation speed was 250 msecs and collimation was .6 mm. Beta blockade and 0.8 mg of sl NTG was given. The 3D data set was reconstructed in 5% intervals of the 35-75 % of the R-R cycle. Systolic and diastolic phases were analyzed on a dedicated work station using MPR, MIP and VRT modes. The patient received contrast: 166m OMNIPAQUE IOHEXOL 350 MG/ML SOLN.   FINDINGS: Image quality: Good   Noise artifact is: Limited   Coronary calcium score is 436, which places the patient in the 58th percentile for age and sex matched control.   LM: 5.5  LAD: 336   LCx: 0   RCA: 94.6   Coronary arteries: Normal coronary origins.  Right dominance.   Right Coronary Artery: Minimal tubular atherosclerotic plaque in the proximal RCA, <25% stenosis. The RCA then becomes ectatic, measuring 6.7 cm. Minimal mixed atherosclerotic plaque  in the mid and distal RCA, <25% stenosis. Patent PDA, PLA.   Left Main Coronary Artery: Minimal atherosclerotic plaque in the short segment LCMA, <25% stenosis.   Left Anterior Descending Coronary Artery: Mild mixed atherosclerotic plaque in the proximal LAD, 25-49% stenosis, low attenuation plaque with positive remodeling. Minimal mixed atherosclerotic plaque in the mid LAD, <25% stenosis followed by mild mixed atherosclerotic plaque 25-49% stenosis. Diffusely diseased first diagonal branch. Proximal D1 mild stenosis (25-49% stenoisis), and in the mid portion of the small caliber first diagonal there is a probable moderate stenosis with calcified plaque, at least 50-69% stenosis. This vessel is <21m proximal to the stenosis.   Left Circumflex Artery: No detectable plaque or stenosis.   Aorta: Dilated thoracic aorta, not fully evaluated on this exam. The sinus of Valsalva and mid ascending aorta are mildly dilated at 41 mm and 40 mm respectively, measured double oblique. Mild aortic root and descending aorta calcifications.   Aortic Valve: Trivial calcifications.   Other findings:   Normal variant pulmonary vein drainage into the left atrium, common antrum of left sided pulmonary veins.   Normal left atrial appendage without thrombus.   Mild dilation of main pulmonary artery.   [facility].   IMPRESSION: 1. 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 Low attenuation plaque with positive remodeling in proximal LAD, CADRADS 2V.   2. Probable moderate mixed plaque in the small caliber mid first diagonal branch, 50-69% stenosis, with proximal vessel lumen <2 mm. Due to vessel size, CT FFR will not be performed.   3. Coronary calcium score is 436, which places the patient in the 58th percentile for age and sex matched control.   4. Normal coronary origins with  right dominance.   5. Dilated thoracic aorta not fully evaluated on this exam. Mild dilation of sinus of Valsalva (41 mm) and mid ascending aorta (40 mm).   RECOMMENDATIONS: CAD-RADS 2. Mild non-obstructive CAD (25-49%). Consider non-atherosclerotic causes of chest pain. Consider preventive therapy and risk factor modification. "V" modifier indicates vulnerable plaque characteristics, >/=2 in one lesions (positive remodeling, low attenuation plaque).   Electronically Signed: By: GCherlynn KaiserM.D. On: 09/22/2021 13:45        Narrative & Impression CLINICAL DATA:  Thoracic aortic aneurysm.   EXAM: CT ANGIOGRAPHY CHEST WITH CONTRAST   TECHNIQUE: Multidetector CT imaging of the chest was performed using the standard protocol during bolus administration of intravenous contrast. Multiplanar CT image reconstructions and MIPs were obtained to evaluate the vascular anatomy.   RADIATION DOSE REDUCTION: This exam was performed according to the departmental dose-optimization program which includes automated exposure control, adjustment of the mA and/or kV according to patient size and/or use of iterative reconstruction technique.   CONTRAST:  1061mOMNIPAQUE IOHEXOL 350 MG/ML SOLN   COMPARISON:  July 20, 2019.   FINDINGS: Cardiovascular: Atherosclerosis of thoracic aorta is noted without dissection. There is been significant enlargement of saccular aneurysm arising from the inferior aspect of the transverse aortic arch measuring 6.0 by 4.6 cm. Normal cardiac size. No pericardial effusion. Coronary artery calcifications are noted. 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   Mediastinum/Nodes: No enlarged mediastinal, hilar, or axillary lymph nodes. Thyroid gland, trachea, and esophagus demonstrate no significant findings.   Lungs/Pleura: No pneumothorax or pleural effusion is noted. Minimal biapical scarring is noted. Mild bibasilar  subsegmental atelectasis is noted.   Upper Abdomen: Stable hepatic cysts.   Musculoskeletal: No chest wall abnormality. No acute or significant osseous findings.   Review of the MIP images confirms the above findings.   IMPRESSION: 6.0 x 4.6 cm saccular aneurysm is seen arising from inferior aspect of the transverse aortic arch which is significantly enlarged compared to prior exam. Cardiothoracic surgery consultation recommended due to increased risk of rupture for arch aneurysm ? 5.5 cm. This recommendation follows 2010 ACCF/AHA/AATS/ACR/ASA/SCA/SCAI/SIR/STS/SVM Guidelines for the Diagnosis and Management of Patients With Thoracic Aortic Disease. Circulation. 2010; 121: K103-X281. Aortic aneurysm NOS (ICD10-I71.9). These results will be called to the ordering clinician or representative by the Radiologist Assistant, and communication documented in the PACS or zVision Dashboard.   Coronary artery calcifications are noted.   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   Minimal biapical scarring is noted. Mild bibasilar subsegmental atelectasis is noted.   Aortic Atherosclerosis (ICD10-I70.0).     Electronically Signed   By: Marijo Conception M.D.   On: 08/19/2021 12:10   Impression:   This 77 year old gentleman has an enlarging 6 cm saccular aneurysm arising from the inferior aortic arch with an aberrant right subclavian artery arising from the distal aortic arch.  I agree that the best treatment for him is aortic arch de-branching with stent grafting across the aortic arch to exclude this aneurysm.  He has undergone right carotid-subclavian transposition preoperatively by Dr. Trula Slade in preparation for this.  I discussed the operative procedure with the patient and his wife including alternatives, benefits and risks; including but not limited to bleeding, blood transfusion, infection, stroke, stent graft failure, paralysis or paresis of the lower  extremities, organ dysfunction, and death.  Boykin Nearing. understands and agrees to proceed.     Plan:   Aortic arch de-branching via median sternotomy and stent grafting across the aortic arch aneurysm.     [name], MD [facility] [phone]

## 2022-01-10 ENCOUNTER — Inpatient Hospital Stay (HOSPITAL_COMMUNITY): Payer: Medicare Other

## 2022-01-10 ENCOUNTER — Encounter (HOSPITAL_COMMUNITY): Payer: Self-pay | Admitting: Surgery

## 2022-01-10 ENCOUNTER — Inpatient Hospital Stay (HOSPITAL_COMMUNITY): Payer: Medicare Other | Admitting: Physician Assistant

## 2022-01-10 ENCOUNTER — Inpatient Hospital Stay (HOSPITAL_COMMUNITY)
Admission: RE | Admit: 2022-01-10 | Discharge: 2022-01-21 | DRG: 220 | Disposition: A | Discharge status: Home / Self Care | Payer: Medicare Other | Source: Ambulatory Visit | Attending: Surgery | Admitting: Surgery

## 2022-01-10 ENCOUNTER — Encounter (HOSPITAL_COMMUNITY)
Admission: RE | Disposition: A | Discharge status: Home / Self Care | Payer: Self-pay | Source: Ambulatory Visit | Attending: Surgery

## 2022-01-10 ENCOUNTER — Other Ambulatory Visit: Payer: Self-pay

## 2022-01-10 DIAGNOSIS — I4510 Unspecified right bundle-branch block: Secondary | ICD-10-CM | POA: Diagnosis not present

## 2022-01-10 DIAGNOSIS — C9110 Chronic lymphocytic leukemia of B-cell type not having achieved remission: Secondary | ICD-10-CM | POA: Diagnosis not present

## 2022-01-10 DIAGNOSIS — I2510 Atherosclerotic heart disease of native coronary artery without angina pectoris: Secondary | ICD-10-CM | POA: Diagnosis present

## 2022-01-10 DIAGNOSIS — M7989 Other specified soft tissue disorders: Secondary | ICD-10-CM | POA: Diagnosis not present

## 2022-01-10 DIAGNOSIS — N400 Enlarged prostate without lower urinary tract symptoms: Secondary | ICD-10-CM | POA: Diagnosis present

## 2022-01-10 DIAGNOSIS — D696 Thrombocytopenia, unspecified: Secondary | ICD-10-CM | POA: Diagnosis not present

## 2022-01-10 DIAGNOSIS — Z8673 Personal history of transient ischemic attack (TIA), and cerebral infarction without residual deficits: Secondary | ICD-10-CM

## 2022-01-10 DIAGNOSIS — Z825 Family history of asthma and other chronic lower respiratory diseases: Secondary | ICD-10-CM

## 2022-01-10 DIAGNOSIS — J9811 Atelectasis: Secondary | ICD-10-CM | POA: Diagnosis not present

## 2022-01-10 DIAGNOSIS — K219 Gastro-esophageal reflux disease without esophagitis: Secondary | ICD-10-CM | POA: Diagnosis present

## 2022-01-10 DIAGNOSIS — E782 Mixed hyperlipidemia: Secondary | ICD-10-CM | POA: Diagnosis present

## 2022-01-10 DIAGNOSIS — E8770 Fluid overload, unspecified: Secondary | ICD-10-CM | POA: Diagnosis not present

## 2022-01-10 DIAGNOSIS — Z87891 Personal history of nicotine dependence: Secondary | ICD-10-CM

## 2022-01-10 DIAGNOSIS — I7122 Aneurysm of the aortic arch, without rupture: Principal | ICD-10-CM | POA: Diagnosis present

## 2022-01-10 DIAGNOSIS — Z833 Family history of diabetes mellitus: Secondary | ICD-10-CM | POA: Diagnosis not present

## 2022-01-10 DIAGNOSIS — R079 Chest pain, unspecified: Secondary | ICD-10-CM | POA: Diagnosis not present

## 2022-01-10 DIAGNOSIS — I088 Other rheumatic multiple valve diseases: Secondary | ICD-10-CM | POA: Diagnosis not present

## 2022-01-10 DIAGNOSIS — D62 Acute posthemorrhagic anemia: Secondary | ICD-10-CM | POA: Diagnosis not present

## 2022-01-10 DIAGNOSIS — E871 Hypo-osmolality and hyponatremia: Secondary | ICD-10-CM | POA: Diagnosis present

## 2022-01-10 DIAGNOSIS — I712 Thoracic aortic aneurysm, without rupture, unspecified: Secondary | ICD-10-CM

## 2022-01-10 DIAGNOSIS — Z8679 Personal history of other diseases of the circulatory system: Principal | ICD-10-CM

## 2022-01-10 DIAGNOSIS — Z8616 Personal history of COVID-19: Secondary | ICD-10-CM | POA: Diagnosis not present

## 2022-01-10 DIAGNOSIS — Z809 Family history of malignant neoplasm, unspecified: Secondary | ICD-10-CM | POA: Diagnosis not present

## 2022-01-10 DIAGNOSIS — R5383 Other fatigue: Secondary | ICD-10-CM | POA: Diagnosis not present

## 2022-01-10 DIAGNOSIS — R739 Hyperglycemia, unspecified: Secondary | ICD-10-CM | POA: Diagnosis not present

## 2022-01-10 DIAGNOSIS — I739 Peripheral vascular disease, unspecified: Secondary | ICD-10-CM | POA: Diagnosis not present

## 2022-01-10 DIAGNOSIS — J1282 Pneumonia due to coronavirus disease 2019: Secondary | ICD-10-CM

## 2022-01-10 DIAGNOSIS — I4891 Unspecified atrial fibrillation: Secondary | ICD-10-CM | POA: Insufficient documentation

## 2022-01-10 DIAGNOSIS — R531 Weakness: Secondary | ICD-10-CM | POA: Diagnosis not present

## 2022-01-10 DIAGNOSIS — J189 Pneumonia, unspecified organism: Secondary | ICD-10-CM | POA: Diagnosis not present

## 2022-01-10 DIAGNOSIS — J90 Pleural effusion, not elsewhere classified: Secondary | ICD-10-CM | POA: Diagnosis not present

## 2022-01-10 DIAGNOSIS — R9439 Abnormal result of other cardiovascular function study: Secondary | ICD-10-CM

## 2022-01-10 HISTORY — PX: AORTIC ARCH DEBRANCHING: SHX6787

## 2022-01-10 HISTORY — PX: TEE WITHOUT CARDIOVERSION: SHX5443

## 2022-01-10 HISTORY — PX: THORACIC AORTIC ENDOVASCULAR STENT GRAFT: SHX6112

## 2022-01-10 LAB — POCT I-STAT 7, (LYTES, BLD GAS, ICA,H+H)
Acid-Base Excess: 0 mmol/L (ref 0.0–2.0)
Acid-Base Excess: 1 mmol/L (ref 0.0–2.0)
Acid-base deficit: 2 mmol/L (ref 0.0–2.0)
Acid-base deficit: 5 mmol/L — ABNORMAL HIGH (ref 0.0–2.0)
Acid-base deficit: 5 mmol/L — ABNORMAL HIGH (ref 0.0–2.0)
Acid-base deficit: 6 mmol/L — ABNORMAL HIGH (ref 0.0–2.0)
Bicarbonate: 24.9 mmol/L (ref 20.0–28.0)
Bicarbonate: 25.7 mmol/L (ref 20.0–28.0)
Bicarbonate: 23.4 mmol/L (ref 20.0–28.0)
Bicarbonate: 21.6 mmol/L (ref 20.0–28.0)
Bicarbonate: 21.6 mmol/L (ref 20.0–28.0)
Bicarbonate: 21.4 mmol/L (ref 20.0–28.0)
Calcium, Ion: 1.28 mmol/L (ref 1.15–1.40)
Calcium, Ion: 1.19 mmol/L (ref 1.15–1.40)
Calcium, Ion: 1.15 mmol/L (ref 1.15–1.40)
Calcium, Ion: 1.19 mmol/L (ref 1.15–1.40)
Calcium, Ion: 1.13 mmol/L — ABNORMAL LOW (ref 1.15–1.40)
Calcium, Ion: 1.19 mmol/L (ref 1.15–1.40)
HCT: 34 % — ABNORMAL LOW (ref 39.0–52.0)
HCT: 28 % — ABNORMAL LOW (ref 39.0–52.0)
HCT: 26 % — ABNORMAL LOW (ref 39.0–52.0)
HCT: 31 % — ABNORMAL LOW (ref 39.0–52.0)
HCT: 34 % — ABNORMAL LOW (ref 39.0–52.0)
HCT: 37 % — ABNORMAL LOW (ref 39.0–52.0)
Hemoglobin: 11.6 g/dL — ABNORMAL LOW (ref 13.0–17.0)
Hemoglobin: 9.5 g/dL — ABNORMAL LOW (ref 13.0–17.0)
Hemoglobin: 8.8 g/dL — ABNORMAL LOW (ref 13.0–17.0)
Hemoglobin: 10.5 g/dL — ABNORMAL LOW (ref 13.0–17.0)
Hemoglobin: 11.6 g/dL — ABNORMAL LOW (ref 13.0–17.0)
Hemoglobin: 12.6 g/dL — ABNORMAL LOW (ref 13.0–17.0)
O2 Saturation: 100 %
O2 Saturation: 100 %
O2 Saturation: 100 %
O2 Saturation: 99 %
O2 Saturation: 100 %
O2 Saturation: 99 %
Patient temperature: 97.5
Potassium: 3.7 mmol/L (ref 3.5–5.1)
Potassium: 3.8 mmol/L (ref 3.5–5.1)
Potassium: 3.7 mmol/L (ref 3.5–5.1)
Potassium: 4.3 mmol/L (ref 3.5–5.1)
Potassium: 4.2 mmol/L (ref 3.5–5.1)
Potassium: 4.3 mmol/L (ref 3.5–5.1)
Sodium: 138 mmol/L (ref 135–145)
Sodium: 138 mmol/L (ref 135–145)
Sodium: 137 mmol/L (ref 135–145)
Sodium: 138 mmol/L (ref 135–145)
Sodium: 138 mmol/L (ref 135–145)
Sodium: 138 mmol/L (ref 135–145)
TCO2: 26 mmol/L (ref 22–32)
TCO2: 27 mmol/L (ref 22–32)
TCO2: 25 mmol/L (ref 22–32)
TCO2: 23 mmol/L (ref 22–32)
TCO2: 23 mmol/L (ref 22–32)
TCO2: 23 mmol/L (ref 22–32)
pCO2 arterial: 40.3 mmHg (ref 32–48)
pCO2 arterial: 42.6 mmHg (ref 32–48)
pCO2 arterial: 44.1 mmHg (ref 32–48)
pCO2 arterial: 47.4 mmHg (ref 32–48)
pCO2 arterial: 46.5 mmHg (ref 32–48)
pCO2 arterial: 47.8 mmHg (ref 32–48)
pH, Arterial: 7.399 (ref 7.35–7.45)
pH, Arterial: 7.39 (ref 7.35–7.45)
pH, Arterial: 7.333 — ABNORMAL LOW (ref 7.35–7.45)
pH, Arterial: 7.267 — ABNORMAL LOW (ref 7.35–7.45)
pH, Arterial: 7.274 — ABNORMAL LOW (ref 7.35–7.45)
pH, Arterial: 7.257 — ABNORMAL LOW (ref 7.35–7.45)
pO2, Arterial: 349 mmHg — ABNORMAL HIGH (ref 83–108)
pO2, Arterial: 369 mmHg — ABNORMAL HIGH (ref 83–108)
pO2, Arterial: 234 mmHg — ABNORMAL HIGH (ref 83–108)
pO2, Arterial: 133 mmHg — ABNORMAL HIGH (ref 83–108)
pO2, Arterial: 359 mmHg — ABNORMAL HIGH (ref 83–108)
pO2, Arterial: 140 mmHg — ABNORMAL HIGH (ref 83–108)

## 2022-01-10 LAB — POCT I-STAT, CHEM 8
BUN: 13 mg/dL (ref 8–23)
BUN: 12 mg/dL (ref 8–23)
BUN: 12 mg/dL (ref 8–23)
BUN: 12 mg/dL (ref 8–23)
Calcium, Ion: 1.28 mmol/L (ref 1.15–1.40)
Calcium, Ion: 1.19 mmol/L (ref 1.15–1.40)
Calcium, Ion: 1.19 mmol/L (ref 1.15–1.40)
Calcium, Ion: 1.18 mmol/L (ref 1.15–1.40)
Chloride: 101 mmol/L (ref 98–111)
Chloride: 101 mmol/L (ref 98–111)
Chloride: 101 mmol/L (ref 98–111)
Chloride: 101 mmol/L (ref 98–111)
Creatinine, Ser: 0.9 mg/dL (ref 0.61–1.24)
Creatinine, Ser: 0.9 mg/dL (ref 0.61–1.24)
Creatinine, Ser: 1 mg/dL (ref 0.61–1.24)
Creatinine, Ser: 1.1 mg/dL (ref 0.61–1.24)
Glucose, Bld: 99 mg/dL (ref 70–99)
Glucose, Bld: 117 mg/dL — ABNORMAL HIGH (ref 70–99)
Glucose, Bld: 131 mg/dL — ABNORMAL HIGH (ref 70–99)
Glucose, Bld: 152 mg/dL — ABNORMAL HIGH (ref 70–99)
HCT: 34 % — ABNORMAL LOW (ref 39.0–52.0)
HCT: 30 % — ABNORMAL LOW (ref 39.0–52.0)
HCT: 27 % — ABNORMAL LOW (ref 39.0–52.0)
HCT: 26 % — ABNORMAL LOW (ref 39.0–52.0)
Hemoglobin: 11.6 g/dL — ABNORMAL LOW (ref 13.0–17.0)
Hemoglobin: 10.2 g/dL — ABNORMAL LOW (ref 13.0–17.0)
Hemoglobin: 9.2 g/dL — ABNORMAL LOW (ref 13.0–17.0)
Hemoglobin: 8.8 g/dL — ABNORMAL LOW (ref 13.0–17.0)
Potassium: 3.7 mmol/L (ref 3.5–5.1)
Potassium: 4 mmol/L (ref 3.5–5.1)
Potassium: 3.9 mmol/L (ref 3.5–5.1)
Potassium: 3.8 mmol/L (ref 3.5–5.1)
Sodium: 139 mmol/L (ref 135–145)
Sodium: 138 mmol/L (ref 135–145)
Sodium: 139 mmol/L (ref 135–145)
Sodium: 139 mmol/L (ref 135–145)
TCO2: 28 mmol/L (ref 22–32)
TCO2: 28 mmol/L (ref 22–32)
TCO2: 27 mmol/L (ref 22–32)
TCO2: 26 mmol/L (ref 22–32)

## 2022-01-10 LAB — BASIC METABOLIC PANEL
Anion gap: 8 (ref 5–15)
BUN: 13 mg/dL (ref 8–23)
CO2: 23 mmol/L (ref 22–32)
Calcium: 7.7 mg/dL — ABNORMAL LOW (ref 8.9–10.3)
Chloride: 104 mmol/L (ref 98–111)
Creatinine, Ser: 1.19 mg/dL (ref 0.61–1.24)
GFR, Estimated: >60 mL/min (ref >60)
Glucose, Bld: 186 mg/dL — ABNORMAL HIGH (ref 70–99)
Potassium: 4 mmol/L (ref 3.5–5.1)
Sodium: 135 mmol/L (ref 135–145)

## 2022-01-10 LAB — CBC
HCT: 38.2 % — ABNORMAL LOW (ref 39.0–52.0)
HCT: 35.5 % — ABNORMAL LOW (ref 39.0–52.0)
Hemoglobin: 12.3 g/dL — ABNORMAL LOW (ref 13.0–17.0)
Hemoglobin: 12.1 g/dL — ABNORMAL LOW (ref 13.0–17.0)
MCH: 31.1 pg (ref 26.0–34.0)
MCH: 31.6 pg (ref 26.0–34.0)
MCHC: 32.2 g/dL (ref 30.0–36.0)
MCHC: 34.1 g/dL (ref 30.0–36.0)
MCV: 96.5 fL (ref 80.0–100.0)
MCV: 92.7 fL (ref 80.0–100.0)
Platelets: 98 10*3/uL — ABNORMAL LOW (ref 150–400)
Platelets: 96 10*3/uL — ABNORMAL LOW (ref 150–400)
RBC: 3.96 MIL/uL — ABNORMAL LOW (ref 4.22–5.81)
RBC: 3.83 MIL/uL — ABNORMAL LOW (ref 4.22–5.81)
RDW: 13.2 % (ref 11.5–15.5)
RDW: 13.2 % (ref 11.5–15.5)
WBC: 35.5 10*3/uL — ABNORMAL HIGH (ref 4.0–10.5)
WBC: 23.7 10*3/uL — ABNORMAL HIGH (ref 4.0–10.5)
nRBC: 1.1 % — ABNORMAL HIGH (ref 0.0–0.2)
nRBC: 0 % (ref 0.0–0.2)

## 2022-01-10 LAB — PROTIME-INR
INR: 1.6 — ABNORMAL HIGH (ref 0.8–1.2)
Prothrombin Time: 19.3 seconds — ABNORMAL HIGH (ref 11.4–15.2)

## 2022-01-10 LAB — APTT: aPTT: 33 seconds (ref 24–36)

## 2022-01-10 LAB — ECHO INTRAOPERATIVE TEE
AV Mean grad: 2 mmHg
AV Peak grad: 4.6 mmHg
Ao pk vel: 1.07 m/s
Area-P 1/2: 1.93 cm2
S' Lateral: 3.13 cm

## 2022-01-10 LAB — GLUCOSE, CAPILLARY
Glucose-Capillary: 227 mg/dL — ABNORMAL HIGH (ref 70–99)
Glucose-Capillary: 207 mg/dL — ABNORMAL HIGH (ref 70–99)
Glucose-Capillary: 187 mg/dL — ABNORMAL HIGH (ref 70–99)
Glucose-Capillary: 186 mg/dL — ABNORMAL HIGH (ref 70–99)
Glucose-Capillary: 150 mg/dL — ABNORMAL HIGH (ref 70–99)
Glucose-Capillary: 195 mg/dL — ABNORMAL HIGH (ref 70–99)
Glucose-Capillary: 168 mg/dL — ABNORMAL HIGH (ref 70–99)

## 2022-01-10 LAB — PREPARE RBC (CROSSMATCH)

## 2022-01-10 LAB — MAGNESIUM: Magnesium: 2.3 mg/dL (ref 1.7–2.4)

## 2022-01-10 LAB — FIBRINOGEN: Fibrinogen: 148 mg/dL — ABNORMAL LOW (ref 210–475)

## 2022-01-10 SURGERY — DEBRANCHING, AORTIC ARCH
Anesthesia: General | Site: Chest

## 2022-01-10 MED ORDER — SODIUM CHLORIDE 0.9 % IV SOLN: INTRAVENOUS | Status: DC

## 2022-01-10 MED ORDER — FENTANYL CITRATE (PF) 250 MCG/5ML IJ SOLN
INTRAMUSCULAR | Status: DC | PRN
  Administered 2022-01-10: 250 ug via INTRAVENOUS
  Administered 2022-01-10: 150 ug via INTRAVENOUS
  Administered 2022-01-10: 50 ug via INTRAVENOUS
  Administered 2022-01-10: 150 ug via INTRAVENOUS
  Administered 2022-01-10 (×3): 50 ug via INTRAVENOUS
  Administered 2022-01-10: 100 ug via INTRAVENOUS
  Administered 2022-01-10 (×2): 50 ug via INTRAVENOUS
  Administered 2022-01-10: 250 ug via INTRAVENOUS
  Administered 2022-01-10 (×3): 50 ug via INTRAVENOUS

## 2022-01-10 MED ORDER — SODIUM BICARBONATE 8.4 % IV SOLN
INTRAVENOUS | Status: AC
  Administered 2022-01-10: 50 meq via INTRAVENOUS
  Filled 2022-01-10: qty 50

## 2022-01-10 MED ORDER — ROCURONIUM BROMIDE 10 MG/ML (PF) SYRINGE
PREFILLED_SYRINGE | INTRAVENOUS | Status: AC
  Filled 2022-01-10: qty 20

## 2022-01-10 MED ORDER — ACETAMINOPHEN 650 MG RE SUPP
650.0000 mg | Freq: Once | RECTAL | Status: AC
  Administered 2022-01-10: 650 mg via RECTAL

## 2022-01-10 MED ORDER — PROTAMINE SULFATE 10 MG/ML IV SOLN
INTRAVENOUS | Status: DC | PRN
  Administered 2022-01-10: 40 mg via INTRAVENOUS
  Administered 2022-01-10: 25 mg via INTRAVENOUS
  Administered 2022-01-10: 10 mg via INTRAVENOUS

## 2022-01-10 MED ORDER — DEXMEDETOMIDINE HCL IN NACL 400 MCG/100ML IV SOLN
0.0000 ug/kg/h | INTRAVENOUS | Status: DC
  Administered 2022-01-10: 0.2 ug/kg/h via INTRAVENOUS
  Filled 2022-01-10: qty 100

## 2022-01-10 MED ORDER — SODIUM BICARBONATE 8.4 % IV SOLN
100.0000 meq | Freq: Once | INTRAVENOUS | Status: AC
  Administered 2022-01-10: 100 meq via INTRAVENOUS

## 2022-01-10 MED ORDER — METOPROLOL TARTRATE 12.5 MG HALF TABLET
12.5000 mg | ORAL_TABLET | Freq: Two times a day (BID) | ORAL | Status: DC
  Administered 2022-01-10 – 2022-01-13 (×6): 12.5 mg via ORAL
  Filled 2022-01-10 (×6): qty 1

## 2022-01-10 MED ORDER — LACTATED RINGERS IV SOLN: INTRAVENOUS | Status: DC

## 2022-01-10 MED ORDER — THROMBIN 20000 UNITS EX SOLR
OROMUCOSAL | Status: DC | PRN
  Administered 2022-01-10: 16 mL via TOPICAL
  Administered 2022-01-10: 12 mL via TOPICAL

## 2022-01-10 MED ORDER — SODIUM CHLORIDE 0.9% IV SOLUTION: Freq: Once | INTRAVENOUS | Status: DC

## 2022-01-10 MED ORDER — LACTATED RINGERS IV SOLN: 500.0000 mL | Freq: Once | INTRAVENOUS | Status: DC | PRN

## 2022-01-10 MED ORDER — PROPOFOL 10 MG/ML IV BOLUS
INTRAVENOUS | Status: DC | PRN
  Administered 2022-01-10: 120 mg via INTRAVENOUS

## 2022-01-10 MED ORDER — LIDOCAINE 2% (20 MG/ML) 5 ML SYRINGE
INTRAMUSCULAR | Status: DC | PRN
  Administered 2022-01-10: 20 mg via INTRAVENOUS

## 2022-01-10 MED ORDER — PROPOFOL 10 MG/ML IV BOLUS
INTRAVENOUS | Status: AC
  Filled 2022-01-10: qty 20

## 2022-01-10 MED ORDER — METOPROLOL TARTRATE 25 MG/10 ML ORAL SUSPENSION: 12.5000 mg | Freq: Two times a day (BID) | ORAL | Status: DC

## 2022-01-10 MED ORDER — PANTOPRAZOLE SODIUM 40 MG PO TBEC
40.0000 mg | DELAYED_RELEASE_TABLET | Freq: Every day | ORAL | Status: DC
  Administered 2022-01-12 – 2022-01-21 (×10): 40 mg via ORAL
  Filled 2022-01-10 (×10): qty 1

## 2022-01-10 MED ORDER — FENTANYL CITRATE (PF) 250 MCG/5ML IJ SOLN
INTRAMUSCULAR | Status: AC
  Filled 2022-01-10: qty 5

## 2022-01-10 MED ORDER — PHENYLEPHRINE 80 MCG/ML (10ML) SYRINGE FOR IV PUSH (FOR BLOOD PRESSURE SUPPORT)
PREFILLED_SYRINGE | INTRAVENOUS | Status: AC
  Filled 2022-01-10: qty 10

## 2022-01-10 MED ORDER — VANCOMYCIN HCL IN DEXTROSE 1-5 GM/200ML-% IV SOLN
1000.0000 mg | Freq: Once | INTRAVENOUS | Status: AC
  Administered 2022-01-11: 1000 mg via INTRAVENOUS
  Filled 2022-01-10: qty 200

## 2022-01-10 MED ORDER — HEMOSTATIC AGENTS (NO CHARGE) OPTIME
TOPICAL | Status: DC | PRN
  Administered 2022-01-10: 1 via TOPICAL
  Administered 2022-01-10 (×2): 3 via TOPICAL

## 2022-01-10 MED ORDER — BISACODYL 5 MG PO TBEC
10.0000 mg | DELAYED_RELEASE_TABLET | Freq: Every day | ORAL | Status: DC
  Administered 2022-01-11 – 2022-01-13 (×3): 10 mg via ORAL
  Filled 2022-01-10 (×3): qty 2

## 2022-01-10 MED ORDER — SODIUM CHLORIDE 0.9 % IV SOLN: 250.0000 mL | INTRAVENOUS | Status: DC

## 2022-01-10 MED ORDER — UMECLIDINIUM BROMIDE 62.5 MCG/ACT IN AEPB
1.0000 | INHALATION_SPRAY | Freq: Every day | RESPIRATORY_TRACT | Status: DC
  Administered 2022-01-11 – 2022-01-21 (×10): 1 via RESPIRATORY_TRACT
  Filled 2022-01-10 (×2): qty 7

## 2022-01-10 MED ORDER — CHLORHEXIDINE GLUCONATE CLOTH 2 % EX PADS: 6.0000 | MEDICATED_PAD | Freq: Once | CUTANEOUS | Status: DC

## 2022-01-10 MED ORDER — OXYCODONE HCL 5 MG PO TABS
5.0000 mg | ORAL_TABLET | ORAL | Status: DC | PRN
  Administered 2022-01-10: 10 mg via ORAL
  Administered 2022-01-14: 5 mg via ORAL
  Administered 2022-01-15 – 2022-01-21 (×10): 10 mg via ORAL
  Filled 2022-01-10 (×3): qty 2
  Filled 2022-01-10: qty 1
  Filled 2022-01-10 (×9): qty 2

## 2022-01-10 MED ORDER — CEFAZOLIN SODIUM-DEXTROSE 2-4 GM/100ML-% IV SOLN
2.0000 g | Freq: Three times a day (TID) | INTRAVENOUS | Status: AC
  Administered 2022-01-10 – 2022-01-12 (×6): 2 g via INTRAVENOUS
  Filled 2022-01-10 (×6): qty 100

## 2022-01-10 MED ORDER — MIDAZOLAM HCL 2 MG/2ML IJ SOLN
2.0000 mg | INTRAMUSCULAR | Status: DC | PRN
  Administered 2022-01-10: 2 mg via INTRAVENOUS
  Filled 2022-01-10: qty 2

## 2022-01-10 MED ORDER — LACTATED RINGERS IV SOLN: INTRAVENOUS | Status: DC | PRN

## 2022-01-10 MED ORDER — ACETAMINOPHEN 500 MG PO TABS
ORAL_TABLET | ORAL | Status: AC
  Administered 2022-01-10: 1000 mg via ORAL
  Filled 2022-01-10: qty 2

## 2022-01-10 MED ORDER — MAGNESIUM SULFATE 4 GM/100ML IV SOLN
4.0000 g | Freq: Once | INTRAVENOUS | Status: AC
  Administered 2022-01-10: 4 g via INTRAVENOUS
  Filled 2022-01-10: qty 100

## 2022-01-10 MED ORDER — BUDESON-GLYCOPYRROL-FORMOTEROL 160-9-4.8 MCG/ACT IN AERO: 2.0000 | INHALATION_SPRAY | Freq: Two times a day (BID) | RESPIRATORY_TRACT | Status: DC

## 2022-01-10 MED ORDER — 0.9 % SODIUM CHLORIDE (POUR BTL) OPTIME
TOPICAL | Status: DC | PRN
  Administered 2022-01-10: 5000 mL

## 2022-01-10 MED ORDER — TRAMADOL HCL 50 MG PO TABS
50.0000 mg | ORAL_TABLET | ORAL | Status: DC | PRN
  Administered 2022-01-11 – 2022-01-13 (×7): 100 mg via ORAL
  Administered 2022-01-14: 50 mg via ORAL
  Administered 2022-01-15 – 2022-01-20 (×5): 100 mg via ORAL
  Filled 2022-01-10 (×9): qty 2
  Filled 2022-01-10: qty 1
  Filled 2022-01-10 (×3): qty 2

## 2022-01-10 MED ORDER — NOREPINEPHRINE 4 MG/250ML-% IV SOLN
0.0000 ug/min | INTRAVENOUS | Status: DC
  Administered 2022-01-10: 1 ug/min via INTRAVENOUS
  Administered 2022-01-11: 2 ug/min via INTRAVENOUS
  Filled 2022-01-10: qty 250

## 2022-01-10 MED ORDER — EPHEDRINE 5 MG/ML INJ
INTRAVENOUS | Status: AC
  Filled 2022-01-10: qty 5

## 2022-01-10 MED ORDER — MOMETASONE FURO-FORMOTEROL FUM 200-5 MCG/ACT IN AERO
2.0000 | INHALATION_SPRAY | Freq: Two times a day (BID) | RESPIRATORY_TRACT | Status: DC
  Administered 2022-01-11 – 2022-01-21 (×20): 2 via RESPIRATORY_TRACT
  Filled 2022-01-10: qty 8.8

## 2022-01-10 MED ORDER — ALBUMIN HUMAN 5 % IV SOLN
250.0000 mL | INTRAVENOUS | Status: DC | PRN
  Administered 2022-01-10 (×2): 12.5 g via INTRAVENOUS
  Filled 2022-01-10: qty 250

## 2022-01-10 MED ORDER — TAMSULOSIN HCL 0.4 MG PO CAPS
0.4000 mg | ORAL_CAPSULE | Freq: Every morning | ORAL | Status: DC
  Administered 2022-01-11 – 2022-01-21 (×11): 0.4 mg via ORAL
  Filled 2022-01-10 (×11): qty 1

## 2022-01-10 MED ORDER — MORPHINE SULFATE (PF) 2 MG/ML IV SOLN
1.0000 mg | INTRAVENOUS | Status: DC | PRN
  Administered 2022-01-10 (×2): 2 mg via INTRAVENOUS
  Administered 2022-01-11: 4 mg via INTRAVENOUS
  Administered 2022-01-11: 1 mg via INTRAVENOUS
  Administered 2022-01-11 – 2022-01-13 (×2): 2 mg via INTRAVENOUS
  Filled 2022-01-10 (×5): qty 1
  Filled 2022-01-10: qty 2
  Filled 2022-01-10: qty 1

## 2022-01-10 MED ORDER — ASPIRIN 81 MG PO CHEW: 324.0000 mg | CHEWABLE_TABLET | Freq: Every day | ORAL | Status: DC

## 2022-01-10 MED ORDER — SODIUM CHLORIDE 0.9% FLUSH
3.0000 mL | Freq: Two times a day (BID) | INTRAVENOUS | Status: DC
  Administered 2022-01-11 – 2022-01-12 (×4): 3 mL via INTRAVENOUS

## 2022-01-10 MED ORDER — ALBUMIN HUMAN 5 % IV SOLN: INTRAVENOUS | Status: DC | PRN

## 2022-01-10 MED ORDER — CHLORHEXIDINE GLUCONATE 0.12 % MT SOLN: 15.0000 mL | Freq: Once | OROMUCOSAL | Status: DC

## 2022-01-10 MED ORDER — FAMOTIDINE IN NACL 20-0.9 MG/50ML-% IV SOLN
20.0000 mg | Freq: Two times a day (BID) | INTRAVENOUS | Status: AC
  Administered 2022-01-10 (×2): 20 mg via INTRAVENOUS
  Filled 2022-01-10 (×2): qty 50

## 2022-01-10 MED ORDER — SODIUM CHLORIDE 0.9% FLUSH: 3.0000 mL | INTRAVENOUS | Status: DC | PRN

## 2022-01-10 MED ORDER — CHLORHEXIDINE GLUCONATE 0.12 % MT SOLN
OROMUCOSAL | Status: AC
  Administered 2022-01-10: 15 mL
  Filled 2022-01-10: qty 15

## 2022-01-10 MED ORDER — CALCIUM CHLORIDE 10 % IV SOLN
INTRAVENOUS | Status: DC | PRN
  Administered 2022-01-10: 100 mg via INTRAVENOUS

## 2022-01-10 MED ORDER — PLASMA-LYTE A IV SOLN: INTRAVENOUS | Status: DC | PRN

## 2022-01-10 MED ORDER — LIDOCAINE 2% (20 MG/ML) 5 ML SYRINGE
INTRAMUSCULAR | Status: AC
  Filled 2022-01-10: qty 5

## 2022-01-10 MED ORDER — ORAL CARE MOUTH RINSE: 15.0000 mL | OROMUCOSAL | Status: DC | PRN

## 2022-01-10 MED ORDER — MIDAZOLAM HCL 2 MG/2ML IJ SOLN
INTRAMUSCULAR | Status: AC
  Filled 2022-01-10: qty 2

## 2022-01-10 MED ORDER — SODIUM CHLORIDE 0.9 % IV SOLN: 10.0000 mL/h | Freq: Once | INTRAVENOUS | Status: DC

## 2022-01-10 MED ORDER — SODIUM CHLORIDE 0.45 % IV SOLN: INTRAVENOUS | Status: DC | PRN

## 2022-01-10 MED ORDER — SODIUM CHLORIDE 0.9% FLUSH
10.0000 mL | Freq: Two times a day (BID) | INTRAVENOUS | Status: DC
  Administered 2022-01-11: 10 mL
  Administered 2022-01-11: 40 mL
  Administered 2022-01-12 – 2022-01-13 (×3): 10 mL

## 2022-01-10 MED ORDER — EPHEDRINE SULFATE-NACL 50-0.9 MG/10ML-% IV SOSY
PREFILLED_SYRINGE | INTRAVENOUS | Status: DC | PRN
  Administered 2022-01-10 (×6): 5 mg via INTRAVENOUS
  Administered 2022-01-10: 2.5 mg via INTRAVENOUS
  Administered 2022-01-10 (×3): 5 mg via INTRAVENOUS
  Administered 2022-01-10: 2.5 mg via INTRAVENOUS
  Administered 2022-01-10: 5 mg via INTRAVENOUS

## 2022-01-10 MED ORDER — THROMBIN (RECOMBINANT) 20000 UNITS EX SOLR
CUTANEOUS | Status: AC
  Filled 2022-01-10: qty 20000

## 2022-01-10 MED ORDER — HEPARIN SODIUM (PORCINE) 1000 UNIT/ML IJ SOLN
INTRAMUSCULAR | Status: DC | PRN
  Administered 2022-01-10: 2000 [IU] via INTRAVENOUS
  Administered 2022-01-10: 3000 [IU] via INTRAVENOUS
  Administered 2022-01-10 (×2): 2000 [IU] via INTRAVENOUS
  Administered 2022-01-10: 9000 [IU] via INTRAVENOUS
  Administered 2022-01-10: 3000 [IU] via INTRAVENOUS

## 2022-01-10 MED ORDER — METOPROLOL TARTRATE 12.5 MG HALF TABLET: 12.5000 mg | ORAL_TABLET | Freq: Once | ORAL | Status: AC

## 2022-01-10 MED ORDER — INSULIN REGULAR(HUMAN) IN NACL 100-0.9 UT/100ML-% IV SOLN
INTRAVENOUS | Status: DC
  Administered 2022-01-10: 3.8 [IU]/h via INTRAVENOUS

## 2022-01-10 MED ORDER — MIDAZOLAM HCL 2 MG/2ML IJ SOLN
INTRAMUSCULAR | Status: DC | PRN
  Administered 2022-01-10 (×2): 1 mg via INTRAVENOUS
  Administered 2022-01-10: 2 mg via INTRAVENOUS

## 2022-01-10 MED ORDER — CHLORHEXIDINE GLUCONATE 4 % EX LIQD: 30.0000 mL | CUTANEOUS | Status: DC

## 2022-01-10 MED ORDER — CEFAZOLIN SODIUM-DEXTROSE 2-4 GM/100ML-% IV SOLN: 2.0000 g | INTRAVENOUS | Status: DC

## 2022-01-10 MED ORDER — ACETAMINOPHEN 160 MG/5ML PO SOLN: 1000.0000 mg | Freq: Four times a day (QID) | ORAL | Status: DC

## 2022-01-10 MED ORDER — METOPROLOL TARTRATE 12.5 MG HALF TABLET
ORAL_TABLET | ORAL | Status: AC
  Administered 2022-01-10: 12.5 mg via ORAL
  Filled 2022-01-10: qty 1

## 2022-01-10 MED ORDER — ACETAMINOPHEN 500 MG PO TABS: 1000.0000 mg | ORAL_TABLET | Freq: Once | ORAL | Status: AC

## 2022-01-10 MED ORDER — HEPARIN SODIUM (PORCINE) 1000 UNIT/ML IJ SOLN
INTRAMUSCULAR | Status: AC
  Filled 2022-01-10: qty 1

## 2022-01-10 MED ORDER — IODIXANOL 320 MG/ML IV SOLN
INTRAVENOUS | Status: DC | PRN
  Administered 2022-01-10: 42 mL via INTRA_ARTERIAL

## 2022-01-10 MED ORDER — CHLORHEXIDINE GLUCONATE CLOTH 2 % EX PADS
6.0000 | MEDICATED_PAD | Freq: Every day | CUTANEOUS | Status: DC
  Administered 2022-01-10 – 2022-01-13 (×4): 6 via TOPICAL

## 2022-01-10 MED ORDER — METOPROLOL TARTRATE 5 MG/5ML IV SOLN: 2.5000 mg | INTRAVENOUS | Status: DC | PRN

## 2022-01-10 MED ORDER — PHENYLEPHRINE HCL-NACL 20-0.9 MG/250ML-% IV SOLN
0.0000 ug/min | INTRAVENOUS | Status: DC
  Administered 2022-01-11: 20 ug/min via INTRAVENOUS
  Filled 2022-01-10 (×2): qty 250

## 2022-01-10 MED ORDER — DEXTROSE 50 % IV SOLN: 0.0000 mL | INTRAVENOUS | Status: DC | PRN

## 2022-01-10 MED ORDER — ROCURONIUM BROMIDE 10 MG/ML (PF) SYRINGE
PREFILLED_SYRINGE | INTRAVENOUS | Status: DC | PRN
  Administered 2022-01-10 (×3): 20 mg via INTRAVENOUS
  Administered 2022-01-10: 60 mg via INTRAVENOUS
  Administered 2022-01-10 (×2): 20 mg via INTRAVENOUS
  Administered 2022-01-10: 40 mg via INTRAVENOUS
  Administered 2022-01-10 (×3): 20 mg via INTRAVENOUS

## 2022-01-10 MED ORDER — ACETAMINOPHEN 160 MG/5ML PO SOLN: 650.0000 mg | Freq: Once | ORAL | Status: AC

## 2022-01-10 MED ORDER — PHENYLEPHRINE 80 MCG/ML (10ML) SYRINGE FOR IV PUSH (FOR BLOOD PRESSURE SUPPORT)
PREFILLED_SYRINGE | INTRAVENOUS | Status: DC | PRN
  Administered 2022-01-10 (×2): 80 ug via INTRAVENOUS
  Administered 2022-01-10: 60 ug via INTRAVENOUS
  Administered 2022-01-10: 80 ug via INTRAVENOUS
  Administered 2022-01-10: 40 ug via INTRAVENOUS
  Administered 2022-01-10 (×2): 80 ug via INTRAVENOUS
  Administered 2022-01-10: 50 ug via INTRAVENOUS
  Administered 2022-01-10 (×3): 80 ug via INTRAVENOUS
  Administered 2022-01-10: 60 ug via INTRAVENOUS

## 2022-01-10 MED ORDER — DOCUSATE SODIUM 100 MG PO CAPS
200.0000 mg | ORAL_CAPSULE | Freq: Every day | ORAL | Status: DC
  Administered 2022-01-11 – 2022-01-13 (×3): 200 mg via ORAL
  Filled 2022-01-10 (×3): qty 2

## 2022-01-10 MED ORDER — HEPARIN 6000 UNIT IRRIGATION SOLUTION
Status: AC
  Filled 2022-01-10: qty 500

## 2022-01-10 MED ORDER — ASPIRIN 325 MG PO TBEC
325.0000 mg | DELAYED_RELEASE_TABLET | Freq: Every day | ORAL | Status: DC
  Administered 2022-01-11: 325 mg via ORAL
  Filled 2022-01-10: qty 1

## 2022-01-10 MED ORDER — ACETAMINOPHEN 500 MG PO TABS
1000.0000 mg | ORAL_TABLET | Freq: Four times a day (QID) | ORAL | Status: DC
  Administered 2022-01-10 – 2022-01-13 (×10): 1000 mg via ORAL
  Filled 2022-01-10 (×10): qty 2

## 2022-01-10 MED ORDER — SODIUM CHLORIDE 0.9% FLUSH: 10.0000 mL | INTRAVENOUS | Status: DC | PRN

## 2022-01-10 MED ORDER — CHLORHEXIDINE GLUCONATE 0.12 % MT SOLN
15.0000 mL | OROMUCOSAL | Status: AC
  Filled 2022-01-10: qty 15

## 2022-01-10 MED ORDER — ONDANSETRON HCL 4 MG/2ML IJ SOLN
4.0000 mg | Freq: Four times a day (QID) | INTRAMUSCULAR | Status: DC | PRN
  Administered 2022-01-11 – 2022-01-12 (×4): 4 mg via INTRAVENOUS
  Filled 2022-01-10 (×5): qty 2

## 2022-01-10 MED ORDER — ~~LOC~~ CARDIAC SURGERY, PATIENT & FAMILY EDUCATION
Freq: Once | Status: DC
  Filled 2022-01-10: qty 1

## 2022-01-10 MED ORDER — BISACODYL 10 MG RE SUPP: 10.0000 mg | Freq: Every day | RECTAL | Status: DC

## 2022-01-10 MED ORDER — SODIUM BICARBONATE 8.4 % IV SOLN
50.0000 meq | Freq: Once | INTRAVENOUS | Status: AC
  Administered 2022-01-10: 50 meq via INTRAVENOUS

## 2022-01-10 MED ORDER — NITROGLYCERIN IN D5W 200-5 MCG/ML-% IV SOLN
0.0000 ug/min | INTRAVENOUS | Status: DC
  Administered 2022-01-10: 0 ug/min via INTRAVENOUS
  Filled 2022-01-10: qty 250

## 2022-01-10 MED ORDER — POTASSIUM CHLORIDE 10 MEQ/50ML IV SOLN: 10.0000 meq | INTRAVENOUS | Status: DC

## 2022-01-10 MED ORDER — SODIUM CHLORIDE 0.9 % IV SOLN: INTRAVENOUS | Status: DC | PRN

## 2022-01-10 SURGICAL SUPPLY — 149 items
ADAPTER CARDIO PERF ANTE/RETRO (ADAPTER) ×2 IMPLANT
APPLICATOR TIP COSEAL (VASCULAR PRODUCTS) IMPLANT
ATTRACTOMAT 16X20 MAGNETIC DRP (DRAPES) ×2 IMPLANT
BAG COUNTER SPONGE SURGICOUNT (BAG) ×2 IMPLANT
BAG DECANTER FOR FLEXI CONT (MISCELLANEOUS) ×2 IMPLANT
BAG SNAP BAND KOVER 36X36 (MISCELLANEOUS) IMPLANT
BLADE CLIPPER SURG (BLADE) ×2 IMPLANT
BLADE STERNUM SYSTEM 6 (BLADE) ×2 IMPLANT
BLADE SURG 15 STRL LF DISP TIS (BLADE) ×2 IMPLANT
BLADE SURG 15 STRL SS (BLADE) ×2
BOOT SUTURE VASCULAR YLW (MISCELLANEOUS) ×2
CANISTER SUCT 3000ML PPV (MISCELLANEOUS) ×4 IMPLANT
CANNULA GUNDRY RCSP 15FR (MISCELLANEOUS) ×2 IMPLANT
CATH ACCU-VU SIZ PIG 5F 100CM (CATHETERS) ×2 IMPLANT
CATH ANGIO 5F BER2 100CM (CATHETERS) IMPLANT
CATH BALLN TRILOBE 26-42 (BALLOONS) IMPLANT
CATH HEART VENT LEFT (CATHETERS) ×2 IMPLANT
CATH ROBINSON RED A/P 18FR (CATHETERS) ×6 IMPLANT
CATH THORACIC 36FR (CATHETERS) ×2 IMPLANT
CATH THORACIC 36FR RT ANG (CATHETERS) ×2 IMPLANT
CAUTERY HI TEMP FINE TIP 2 (MISCELLANEOUS) IMPLANT
CLAMP SUTURE YELLOW 5 PAIRS (MISCELLANEOUS) IMPLANT
CNTNR URN SCR LID CUP LEK RST (MISCELLANEOUS) ×2 IMPLANT
CONT SPEC 4OZ STRL OR WHT (MISCELLANEOUS) ×2
CONTAINER PROTECT SURGISLUSH (MISCELLANEOUS) ×2 IMPLANT
COVER BACK TABLE 80X110 HD (DRAPES) IMPLANT
COVER DOME SNAP 22 D (MISCELLANEOUS) IMPLANT
COVER MAYO STAND STRL (DRAPES) IMPLANT
COVER PROBE W GEL 5X96 (DRAPES) ×2 IMPLANT
COVER SURGICAL LIGHT HANDLE (MISCELLANEOUS) ×4 IMPLANT
DERMABOND ADVANCED .7 DNX12 (GAUZE/BANDAGES/DRESSINGS) ×2 IMPLANT
DEVICE CLOSURE PERCLS PRGLD 6F (VASCULAR PRODUCTS) IMPLANT
DRAPE CARDIOVASCULAR INCISE (DRAPES) ×2
DRAPE CV SPLIT W-CLR ANES SCRN (DRAPES) IMPLANT
DRAPE INCISE IOBAN 66X45 STRL (DRAPES) IMPLANT
DRAPE PERI GROIN 82X75IN TIB (DRAPES) IMPLANT
DRAPE SRG 135X102X78XABS (DRAPES) ×2 IMPLANT
DRAPE WARM FLUID 44X44 (DRAPES) IMPLANT
DRAPE ZERO GRAVITY STERILE (DRAPES) IMPLANT
DRSG AQUACEL AG ADV 3.5X14 (GAUZE/BANDAGES/DRESSINGS) IMPLANT
DRSG COVADERM 4X14 (GAUZE/BANDAGES/DRESSINGS) ×2 IMPLANT
DRSG TEGADERM 2-3/8X2-3/4 SM (GAUZE/BANDAGES/DRESSINGS) ×2 IMPLANT
DRYSEAL FLEXSHEATH 24FR 33CM (SHEATH) ×2
ELECT BLADE 4.0 EZ CLEAN MEGAD (MISCELLANEOUS) ×4
ELECT CAUTERY BLADE 6.4 (BLADE) ×2 IMPLANT
ELECT REM PT RETURN 9FT ADLT (ELECTROSURGICAL) ×8
ELECTRODE BLDE 4.0 EZ CLN MEGD (MISCELLANEOUS) IMPLANT
ELECTRODE REM PT RTRN 9FT ADLT (ELECTROSURGICAL) ×8 IMPLANT
FELT TEFLON 1X6 (MISCELLANEOUS) IMPLANT
GAUZE 4X4 16PLY ~~LOC~~+RFID DBL (SPONGE) ×2 IMPLANT
GAUZE SPONGE 4X4 12PLY STRL (GAUZE/BANDAGES/DRESSINGS) ×2 IMPLANT
GLOVE BIO SURGEON STRL SZ 6 (GLOVE) IMPLANT
GLOVE BIO SURGEON STRL SZ 6.5 (GLOVE) IMPLANT
GLOVE BIO SURGEON STRL SZ7 (GLOVE) IMPLANT
GLOVE BIO SURGEON STRL SZ7.5 (GLOVE) IMPLANT
GLOVE SURG MICRO LTX SZ7 (GLOVE) ×4 IMPLANT
GLOVE SURG SS PI 7.5 STRL IVOR (GLOVE) ×6 IMPLANT
GOWN STRL REUS W/ TWL LRG LVL3 (GOWN DISPOSABLE) ×12 IMPLANT
GOWN STRL REUS W/ TWL XL LVL3 (GOWN DISPOSABLE) ×4 IMPLANT
GOWN STRL REUS W/TWL LRG LVL3 (GOWN DISPOSABLE) ×12
GOWN STRL REUS W/TWL XL LVL3 (GOWN DISPOSABLE) ×4
GRAFT BALLN CATH 65CM (STENTS) ×2 IMPLANT
GRAFT CV 30X10STRG TUBE (Vascular Products) IMPLANT
GRAFT HEMASHIELD 10MM (Vascular Products) ×2 IMPLANT
GRAFT HEMASHIELD 16X8MM (Vascular Products) IMPLANT
HEMOSTAT POWDER SURGIFOAM 1G (HEMOSTASIS) ×6 IMPLANT
HEMOSTAT SNOW SURGICEL 2X4 (HEMOSTASIS) IMPLANT
HEMOSTAT SURGICEL 2X14 (HEMOSTASIS) ×2 IMPLANT
INSERT FOGARTY SM (MISCELLANEOUS) IMPLANT
INSERT FOGARTY XLG (MISCELLANEOUS) IMPLANT
KIT BASIN OR (CUSTOM PROCEDURE TRAY) ×4 IMPLANT
KIT CATH CPB BARTLE (MISCELLANEOUS) ×2 IMPLANT
KIT NAMIC PS PRESSURIZED FLUID (KITS) IMPLANT
KIT SUCTION CATH 14FR (SUCTIONS) ×2 IMPLANT
KIT TURNOVER KIT B (KITS) ×4 IMPLANT
LOOP VESSEL MAXI BLUE (MISCELLANEOUS) IMPLANT
LOOP VESSEL MINI RED (MISCELLANEOUS) IMPLANT
LOOP VESSEL SUPERMAXI WHITE (MISCELLANEOUS) IMPLANT
NDL PERC 18GX7CM (NEEDLE) IMPLANT
NEEDLE PERC 18GX7CM (NEEDLE) IMPLANT
NS IRRIG 1000ML POUR BTL (IV SOLUTION) ×12 IMPLANT
PACK E OPEN HEART (SUTURE) ×2 IMPLANT
PACK ENDOVASCULAR (PACKS) ×2 IMPLANT
PACK OPEN HEART (CUSTOM PROCEDURE TRAY) ×2 IMPLANT
PAD ARMBOARD 7.5X6 YLW CONV (MISCELLANEOUS) ×8 IMPLANT
PENCIL BUTTON HOLSTER BLD 10FT (ELECTRODE) IMPLANT
PERCLOSE PROGLIDE 6F (VASCULAR PRODUCTS) ×4
POSITIONER HEAD DONUT 9IN (MISCELLANEOUS) ×2 IMPLANT
SEALANT SURG COSEAL 8ML (VASCULAR PRODUCTS) IMPLANT
SET MICROPUNCTURE 5F STIFF (MISCELLANEOUS) ×2 IMPLANT
SHEATH AVANTI 11CM 8FR (SHEATH) ×2 IMPLANT
SHEATH DRYSEAL FLEX 24FR 33CM (SHEATH) IMPLANT
SHEATH PINNACLE 8F 10CM (SHEATH) IMPLANT
SHIELD RADPAD SCOOP 12X17 (MISCELLANEOUS) ×4 IMPLANT
SPONGE T-LAP 18X18 ~~LOC~~+RFID (SPONGE) ×8 IMPLANT
SPONGE T-LAP 4X18 ~~LOC~~+RFID (SPONGE) ×2 IMPLANT
STENT GRAFT BALLN CATH 65CM (STENTS)
STENT GRFT THORAC ACS 40X40X10 (Endovascular Graft) IMPLANT
STENT GRFT THORAC ACS 40X40X15 (Endovascular Graft) IMPLANT
STENT GRFT THORAC ACS 40X40X20 (Endovascular Graft) IMPLANT
STENT GRFT THORAC ACS 45X45X15 (Endovascular Graft) IMPLANT
STENT GRFT THORAC ACS 45X45X20 (Endovascular Graft) IMPLANT
STOPCOCK MORSE 400PSI 3WAY (MISCELLANEOUS) ×2 IMPLANT
SUT BONE WAX W31G (SUTURE) ×2 IMPLANT
SUT EB EXC GRN/WHT 2-0 V-5 (SUTURE) ×4 IMPLANT
SUT ETHIBON EXCEL 2-0 V-5 (SUTURE) IMPLANT
SUT ETHIBOND V-5 VALVE (SUTURE) IMPLANT
SUT ETHILON 3 0 PS 1 (SUTURE) IMPLANT
SUT PROLENE 3 0 SH 1 (SUTURE) ×2 IMPLANT
SUT PROLENE 3 0 SH DA (SUTURE) IMPLANT
SUT PROLENE 4 0 RB 1 (SUTURE) ×20
SUT PROLENE 4 0 SH DA (SUTURE) IMPLANT
SUT PROLENE 4-0 RB1 .5 CRCL 36 (SUTURE) ×8 IMPLANT
SUT PROLENE 5 0 C 1 24 (SUTURE) IMPLANT
SUT PROLENE 5 0 C 1 36 (SUTURE) IMPLANT
SUT PROLENE 5 0 RB 2 (SUTURE) ×4 IMPLANT
SUT STEEL 6MS V (SUTURE) IMPLANT
SUT STEEL STERNAL CCS#1 18IN (SUTURE) IMPLANT
SUT STEEL SZ 6 DBL 3X14 BALL (SUTURE) IMPLANT
SUT VIC AB 1 CTX 27 (SUTURE) IMPLANT
SUT VIC AB 1 CTX 36 (SUTURE) ×8
SUT VIC AB 1 CTX36XBRD ANBCTR (SUTURE) ×4 IMPLANT
SUT VIC AB 2-0 CT1 27 (SUTURE)
SUT VIC AB 2-0 CT1 TAPERPNT 27 (SUTURE) IMPLANT
SUT VIC AB 2-0 CTX 36 (SUTURE) IMPLANT
SUT VIC AB 3-0 SH 27 (SUTURE)
SUT VIC AB 3-0 SH 27X BRD (SUTURE) IMPLANT
SUT VIC AB 3-0 X1 27 (SUTURE) IMPLANT
SUT VICRYL 4-0 PS2 18IN ABS (SUTURE) IMPLANT
SYR 10ML LL (SYRINGE) IMPLANT
SYR 20ML LL LF (SYRINGE) IMPLANT
SYR 30ML LL (SYRINGE) IMPLANT
SYR 50ML LL SCALE MARK (SYRINGE) ×2 IMPLANT
SYSTEM SAHARA CHEST DRAIN ATS (WOUND CARE) ×2 IMPLANT
TAG SUTURE CLAMP YLW 5PR (MISCELLANEOUS) ×2
TAPE CLOTH SURG 4X10 WHT LF (GAUZE/BANDAGES/DRESSINGS) IMPLANT
TOWEL GREEN STERILE (TOWEL DISPOSABLE) ×4 IMPLANT
TOWEL GREEN STERILE FF (TOWEL DISPOSABLE) ×2 IMPLANT
TRAY FOLEY MTR SLVR 16FR STAT (SET/KITS/TRAYS/PACK) ×2 IMPLANT
TRAY FOLEY SLVR 14FR TEMP STAT (SET/KITS/TRAYS/PACK) ×2 IMPLANT
TUBE CONNECTING 20X1/4 (TUBING) IMPLANT
TUBING HIGH PRESSURE 120CM (CONNECTOR) ×2 IMPLANT
UNDERPAD 30X36 HEAVY ABSORB (UNDERPADS AND DIAPERS) ×2 IMPLANT
VENT LEFT HEART 12002 (CATHETERS) ×2
WATER STERILE IRR 1000ML POUR (IV SOLUTION) ×4 IMPLANT
WIRE BENTSON .035X145CM (WIRE) ×2 IMPLANT
WIRE STIFF LUNDERQUIST 260CM (WIRE) ×2 IMPLANT
WIRE TORQFLEX AUST .018X40CM (WIRE) IMPLANT
YANKAUER SUCT BULB TIP NO VENT (SUCTIONS) IMPLANT

## 2022-01-10 NOTE — Hospital Course (Addendum)
PCP is Silverio Decamp, MD Referring Provider is Serafina Mitchell, MD   Chief complaint: aortic arch aneurysm     History of Present Illness:   The patient is a 77 year old gentleman with a history of stable CLL being followed by Dr. 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  It was measured at 4.9 cm at that time.  He recently had a follow-up CT scan that showed an increase in the size of the aneurysm to 6 cm.  He has been followed by Dr. Trula Slade who recommended aortic arch reconstruction and TEVAR.  He was admitted to the hospital on 09/09/2021 with hypoxic respiratory failure and pneumonia.  He has recovered from this.  He recently underwent surgery for right carpal tunnel due to severe pain and inability to use his right hand.  He is making a good recovery from that. He then underwent right subclavian to common carotid artery transposition by Dr. Trula Slade on 12/18/2021 in preparation for aneurysm repair.  This 77 year old gentleman has an enlarging 6 cm saccular aneurysm arising from the inferior aortic arch with an aberrant right subclavian artery arising from the distal aortic arch.  I agree that the best treatment for him is aortic arch de-branching with stent grafting across the aortic arch to exclude this aneurysm.  He has undergone right carotid-subclavian transposition preoperatively by Dr. Trula Slade in preparation for this.  I discussed the operative procedure with the patient and his wife including alternatives, benefits and risks; including but not limited to bleeding, blood transfusion, infection, stroke, stent graft failure, paralysis or paresis of the lower extremities, organ dysfunction, and death.  Boykin Nearing. understands and agrees to proceed.      Hospital Course:  Mr. Proia was admitted for elective  surgery on 01/10/22 and taken to the OR where the aortic arch debranching procedure was performed utilizing a 16 x 8 x 70m Hemashield Lanorris Kalisz graft. A thoracic aortic endovascular graft was also placed.  Following the procedure, he was taken to the ICU in stable condition.  Vital signs and hemodynamics remained stable while in the ICU..[name] Bleeding was controlled.  He was weaned from the ventilator and extubated by 8:30 PM on the day of surgery.  Blood pressure was supported with norepinephrine and Neo-Synephrine early postoperatively but this was weaned off overnight following surgery.  The monitoring lines were removed on the first postoperative day.  The patient was started on a course of routine mobilization..  He had a significant leukocytosis postoperatively felt to be related to his chronic lymphocytic leukemia.  A urinalysis was obtained which did not show evidence of UTI.  He has had no fevers.  He was also thrombocytopenic with platelet count 78,000 by postop day 2.  Recent value on 01/18/2022 is 139 K.  He did not have any significant bleeding.  Lab parameters were monitored closely.  He does have an expected acute blood loss anemia which has stabilized.  Additionally postoperatively he did develop atrial fibrillation which was somewhat difficult to control but subsequently did convert to sinus rhythm.  He is now on Eliquis in addition amiodarone and beta-blocker.  He has some occasional bradycardia in the 50s so this has limited the use of beta-blocker.  He also right bundle branch block.  Incisions are noted to be healing well without evidence of infection.  He is tolerating gradually increasing activities using routine cardiac rehabilitation phase 1 modalities.  Additionally, physical therapy helped with overall rehabilitation which was slow but steady.  He does have an expected volume overload but is responding well to diuretics.  He is now euvolemic and diuretics were discontinued.  All incisions are  healing well he has palpable pulses in all extremities with good strength.  Overall, at the time of discharge the patient is felt to be quite stable.

## 2022-01-10 NOTE — Procedures (Addendum)
Extubation Procedure Note  Patient Details:   Name: Jason Abbott. DOB: December 06, 1945 MRN: [id number]   Airway Documentation:    Vent end date: 01/10/22 Vent end time: 2016   Evaluation  O2 sats: stable throughout Complications: No apparent complications Patient did tolerate procedure well. Bilateral Breath Sounds: Clear, Diminished   Yes  RT extubated pt to 4L [place] per rapid wean protocol. VC of 3.44 and NIF of -32 achieved. RN at bedside, cuff leak noted with no stridor present.   Warrick Parisian 01/10/2022, 8:22 PM

## 2022-01-10 NOTE — Transfer of Care (Signed)
Immediate Anesthesia Transfer of Care Note  Patient: Jason Abbott.  Procedure(s) Performed: AORTIC ARCH DEBRANCHING WITH 16 X 8 X 10 MM HEMASHIELD GOLD GRAFT (Chest) TRANSESOPHAGEAL ECHOCARDIOGRAM (TEE) THORACIC AORTIC ENDOVASCULAR STENT GRAFT  Patient Location: ICU  Anesthesia Type:General  Level of Consciousness: sedated and unresponsive  Airway & Oxygen Therapy: Patient remains intubated per anesthesia plan and Patient placed on Ventilator (see vital sign flow sheet for setting)  Post-op Assessment: Report given to RN and Post -op Vital signs reviewed and stable  Post vital signs: Reviewed and stable  Last Vitals:  Vitals Value Taken Time  BP 145/93 01/10/22 1548  Temp 36.8 C 01/10/22 1557  Pulse    Resp 15 01/10/22 1557  SpO2    Vitals shown include unvalidated device data.  Last Pain:  Vitals:   01/10/22 0625  TempSrc: Oral  PainSc: 0-No pain         Complications: No notable events documented.

## 2022-01-10 NOTE — Anesthesia Procedure Notes (Signed)
Arterial Line Insertion Start/End1/05/2022 7:05 AM, 01/10/2022 7:11 AM Performed by: Josephine Igo, CRNA, CRNA  Patient location: Pre-op. Preanesthetic checklist: patient identified, IV checked, site marked, risks and benefits discussed, surgical consent, monitors and equipment checked, pre-op evaluation, timeout performed and anesthesia consent Lidocaine 1% used for infiltration Left, radial was placed Catheter size: 20 G Hand hygiene performed  and maximum sterile barriers used  Allen's test indicative of satisfactory collateral circulation Attempts: 1 Procedure performed without using ultrasound guided technique. Following insertion, dressing applied and Biopatch. Post procedure assessment: normal  Patient tolerated the procedure well with no immediate complications.

## 2022-01-10 NOTE — Anesthesia Postprocedure Evaluation (Signed)
Anesthesia Post Note  Patient: Jason Abbott.  Procedure(s) Performed: AORTIC ARCH DEBRANCHING WITH 16 X 8 X 10 MM HEMASHIELD GOLD GRAFT (Chest) TRANSESOPHAGEAL ECHOCARDIOGRAM (TEE) THORACIC AORTIC ENDOVASCULAR STENT GRAFT     Patient location during evaluation: SICU Anesthesia Type: General Level of consciousness: sedated and patient remains intubated per anesthesia plan Pain management: pain level controlled Respiratory status: patient remains intubated per anesthesia plan and patient on ventilator - see flowsheet for VS (beginning to wean from vent) Cardiovascular status: stable Postop Assessment: no apparent nausea or vomiting Anesthetic complications: no Comments: Minimal chest tube drainage   No notable events documented.  Last Vitals:  Vitals:   01/10/22 1705 01/10/22 1707  BP:    Pulse:    Resp: 17 16  Temp: 36.8 C 36.8 C  SpO2:      Last Pain:  Vitals:   01/10/22 1600  TempSrc:   PainSc: 0-No pain                 Caylea Foronda,E. Kimeka Badour

## 2022-01-10 NOTE — Brief Op Note (Signed)
01/10/2022  2:58 PM  PATIENT:  Jason Abbott.  77 y.o. male  PRE-OPERATIVE DIAGNOSIS:  AORTIC ARCH ANEURYSM  POST-OPERATIVE DIAGNOSIS:  AORTIC ARCH ANEURYSM  PROCEDURES:   AORTIC ARCH DEBRANCHING WITH 16 X 8 X 10 MM HEMASHIELD GOLD GRAFT (N/A) TRANSESOPHAGEAL ECHOCARDIOGRAM (TEE) (N/A) THORACIC AORTIC ENDOVASCULAR STENT GRAFT (N/A)  SURGEONS:   Panel 1:    * Bartle, Fernande Boyden, MD - Primary    * Serafina Mitchell, MD Panel 2:    * Serafina Mitchell, MD - Primary    * Bartle, Fernande Boyden, MD   ASSISTANTS: Sammie Bench, RN, RN First Assistant                [place], Karrie S, RN, Scrub Person               Page, Lauren, RN, Relief Scrub   ANESTHESIA:   general  EBL:  3500 mL   BLOOD ADMINISTERED: Cell Saver 1584m            PRBC's:    12662m DRAINS: {Devices; drains:[id number]}   LOCAL MEDICATIONS USED:  NONE  SPECIMEN:  Ascending aorta  DISPOSITION OF SPECIMEN:  PATHOLOGY  COUNTS:  Correct  DICTATION: .Dragon Dictation  PLAN OF CARE: Admit to inpatient   PATIENT DISPOSITION:  ICU - intubated and hemodynamically stable.   Delay start of Pharmacological VTE agent (>24hrs) due to surgical blood loss or risk of bleeding: yes

## 2022-01-10 NOTE — Anesthesia Procedure Notes (Signed)
Procedure Name: Intubation Date/Time: 01/10/2022 7:56 AM  Performed by: Mariea Clonts, CRNAPre-anesthesia Checklist: Patient identified, Emergency Drugs available, Suction available and Patient being monitored Patient Re-evaluated:Patient Re-evaluated prior to induction Oxygen Delivery Method: Circle System Utilized Preoxygenation: Pre-oxygenation with 100% oxygen Induction Type: IV induction Ventilation: Mask ventilation without difficulty Laryngoscope Size: Miller and 2 Grade View: Grade II Tube type: Oral Tube size: 8.0 mm Number of attempts: 1 Airway Equipment and Method: Stylet and Oral airway Placement Confirmation: ETT inserted through vocal cords under direct vision, positive ETCO2 and breath sounds checked- equal and bilateral Tube secured with: Tape Dental Injury: Teeth and Oropharynx as per pre-operative assessment

## 2022-01-10 NOTE — Op Note (Signed)
Patient name: Jason Abbott. MRN: [id number] DOB: 02-05-1945 Sex: male  01/10/2022 Pre-operative Diagnosis: Thoracic aortic aneurysm Post-operative diagnosis:  Same Surgeon:  [name] Co-Surgeon:  Gilford Raid Procedure:   #1: Median sternotomy   #2: Transesophageal echo   #3: Aortic arch de-branching (aorta to right common carotid artery, aorta to left common carotid artery, aorta to left subclavian artery bypass)    #4: Ultrasound-guided percutaneous access, right common femoral artery   #5: Endovascular repair of thoracic aortic aneurysm with coverage of left subclavian artery   #6: Initial proximal extension (CPT [id number])   #7: Additional proximal extension (CPT N448937)   #8: Radiology S&I (CPT [id number] x2, [phone])   #9: Aortic arch angiogram Anesthesia:  General Blood Loss:  1.5L Specimens:  none  Findings: A bifurcated 16 x 8 graft was placed on the anterior lateral side of the ascending aorta with end to end anastomoses to the right common carotid artery and left common carotid artery.  A 10 mm dacryon graft was sewn to the medial aspect of the tube portion of the 16 x 8 graft and then sewn end to end to the left subclavian artery.  The stumps of the great vessels were oversewn.  A total of 3 endovascular stents were placed so as to avoid bird beaking in the angulated aortic arch.  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  Several weeks ago, the patient underwent right subclavian to right common carotid artery transposition.  He comes in today for definitive repair.  Procedure:  The patient was identified in the holding area and taken to [place] 16  The patient was then placed supine on the table. general anesthesia was administered.  The patient was prepped and draped in the usual sterile fashion.   A time out was called and antibiotics were administered.  A co-surgeon was necessary for this procedure given its complexity.  Dr. Cyndia Bent will separately dictate the median sternotomy and closure.  Following median sternotomy and opening of the pericardium, the ascending aorta was mobilized.  We then isolated the left innominate vein and then exposed the right and left common carotid arteries.  In order to isolate the left subclavian artery, we had to expose the superior to the innominate vein and then dissected back to the aortic arch as it was on the posterior side of the arch.  Once we had good exposure, the patient was fully heparinized.  Heparin levels were monitored with ACT measurements and redosed appropriately.  We selected a 16 x 8 dacryon graft.  The tube portion was cut to the appropriate length.  We then created a graftotomy on the left side of the graft with an eye cautery.  A 10 mm dacryon graft was then sewn to the tube portion of the 16 x 8 graft.  Next, a Lemole clamp was used to partially occlude the ascending aorta on the anterior lateral right side of the aorta.  A #11 blade was used to make an arteriotomy which was extended with Potts scissors.  The graft was then slightly beveled to fit the size the arteriotomy in a running anastomosis was created with a 3-0 Prolene in continuous manner.  CoSeal was placed over the sutures and the clamp was released.  The anastomosis was hemostatic.  Next, the right common  carotid artery was occluded with a Henley clamp distally.  There was a small branch arising from the common carotid artery which was preserved.  A second Henley clamp was placed at the carotid origin and the artery was transected.  The stump was oversewn with a 4-0 Prolene in 2 layers.  The 8 mm branch of the graft was then cut to the appropriate length and a end to end anastomosis was performed to the right common carotid artery.  This was done with a 5-0 Prolene.  Prior to completion the  appropriate flushing maneuvers were performed and the clamps were released.  We then occluded the left common carotid artery distally with a Henley clamp and a DeBakey clamp proximally.  The left common carotid artery was then transected.  The stump was oversewn with a 4-0 Prolene in 2 layers and the DeBakey clamp was removed.  The second 8 mm limb of the graft was then cut to the appropriate length and a end to end anastomosis was performed with a running 5-0 Prolene.  Prior to completion, the appropriate flushing maneuvers were performed and the anastomosis was completed.  Both of these anastomoses were inferior to the crossing left innominate vein.  Next, the subclavian artery was occluded distally with a Henley clamp and proximally with a Henley clamp and then divided.  The stump was oversewn with a 4-0 Prolene in 2 layers.  We then tunneled the 10 mm limb of the graft under the innominate vein and a end to end anastomosis was performed with running 5-0 Prolene.  Prior to completion the appropriate flushing maneuvers performed, and the anastomosis was completed.  All anastomoses were hemostatic, as were the oversewn stumps of the great vessels.  A radiopaque marker was placed around the origin of the graft on the ascending aorta.  The sternotomy retractor was then removed.  Attention was then turned towards the right groin.  The right common femoral artery was evaluated with ultrasound and found to be widely patent without significant disease.  A #11 blade was used to make a skin nick.  The right common femoral artery was then cannulated under ultrasound guidance with a micropuncture needle.  A 018 wire was then inserted followed by placement of a micropuncture sheath.  A Bentson wire was then inserted.  The subcutaneous tract was dilated with an 8 [place] dilator.  Pro-glide devices were deployed at the 11:00 and 1 o'clock position for free closure and a 8 French sheath was placed.  A pigtail catheter was  advanced into the ascending aorta and an aortic arch angiogram was performed.  This showed patency of the great vessels and the bypasses.  A Lunderquist double curve wire was then inserted followed by placement of a 24 French dry seal sheath.  There was significant angulation in the right sided aortic arch and therefore we had to plan appropriately the location of the stent so as to avoid bird beaking.  Based off markings from the angiogram, we first deployed a Gore CTAG 40 x 10 in the descending thoracic aorta.  This was bridged with a 40 x 20 device which coursed through the aortic arch into the ascending aorta.  With initial insertion of the second piece, the first piece was pushed approximately for about 2 cm.  I had to use a trilobed balloon to pull the proximal piece back to its original location.  Finally, a proximal extension Gore CTAG device 45 x 15 was placed, landing at the level of  the De-branching graft.  A trilobed balloon was used to mold the device overlaps.  Finally, a completion arteriogram was performed that showed exclusion of the aneurysm and continued patency of the bypass grafts.  There was no evidence of endoleak.  The Lunderquist wire was removed and replaced with a Bentson wire.  The dry seal sheath was then removed and the arteriotomy closed by securing the previously placed ProGlide devices.  The groin remained hemostatic.  The patient had brisk pedal Doppler signals.  The groin incision was closed with cautery and Dermabond.  At this point, the patient's heparin was reversed with protamine.  Please see Dr. Vivi Martens note for closure.  Please note that after the initial closure, approximately 600 cc of blood was found into the chest tubes which required reopening of the sternotomy and repairing a defect on the toe of the aortic graft.   Disposition: To the ICU and guarded but stable condition, intubated   V. [name], M.D., [facility]  [place] Office: [phone] Pager:  [phone]

## 2022-01-10 NOTE — Op Note (Signed)
CARDIOVASCULAR SURGERY OPERATIVE NOTE  01/10/2022  Co-Surgeons:  [name], MD and [name], MD    Preoperative Diagnosis:  6 cm aortic arch aneurysm   Postoperative Diagnosis:  Same   Procedure:  Median Sternotomy 2.   Aortic arch de-branching with aorto-right common carotid artery, aorto-left common carotid artery and aorto-left subclavian artery bypasses. 3.   Aortic arch aneurysm repair using a stent graft.  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  I agree that the best treatment for him is aortic arch de-branching with stent grafting across the aortic arch to exclude this aneurysm.  He has undergone right carotid-subclavian transposition preoperatively by Dr. Trula Slade in preparation for this.  I discussed the operative procedure with the patient and his wife including alternatives, benefits and risks; including but not limited to bleeding, blood transfusion, infection, stroke, stent graft failure, paralysis or paresis of the lower extremities, organ dysfunction, and death.  Boykin Nearing. understands and agrees to proceed.   Preparation:  The patient was seen in the preoperative holding area and the correct patient, correct operation were confirmed with the patient after reviewing the medical record and CTA scans. The consent was signed by me. Preoperative antibiotics were given. A right IJ sheath and bilateral radial arterial lines were placed by the anesthesia team. The patient was taken back to the operating room and positioned supine on the operating room table. After being placed under general endotracheal anesthesia by the anesthesia team a foley catheter was  placed. The neck, chest, abdomen, and both legs were prepped with betadine soap and solution and draped in the usual sterile manner. A surgical time-out was taken and the correct patient and operative procedure were confirmed with the nursing and anesthesia staff.   Median Sternotomy:  A median sternotomy was performed and the pericardium opened in the midline. RV function appeared normal. The ascending aorta was of normal size with no palpable plaque. Dissection was performed to mobilize the brachiocephalic vein and the great vessels. The de-branching portion of the procedure is as documented in Dr. Stephens Shire operative note.   Thoracic Endovascular Aneurysm Repair:  This portion of the procedure is as documented in Dr. Stephens Shire operative note.   Completion:  Heparin was fully reversed with protamine and the proximal aortic and distal great vessel anastomoses were checked for hemostasis. The position of the grafts was satisfactory. Hemostasis was achieved. Anterior mediastinal and posterior pericardial drainage tubes were placed. The sternum was closed with #6 stainless steel wires. The fascia was closed with continuous # 1 vicryl suture. The subcutaneous tissue was closed with 2-0 vicryl continuous suture. The skin was closed with 3-0 vicryl subcuticular suture. All sponge, needle, and instrument counts were reported correct at the end of the case. Dry sterile dressings were placed over the incisions and around the chest tubes which were connected to pleurevac suction. As the nurses were removing the drapes they noticed sudden drainage of 700 cc of bloody fluid from the chest tubes with hypotension. The tubes had been dry. Dr. Trula Slade and myself scrubbed back in and the chest was quickly prepped with betadine solution and draped in a sterile manner. The incision was opened and the sternal wires removed. A chest retractor was placed and we immediately noted an active bleeding site at the toe of  the  ascending aortic graft. This was repaired with  3-0 prolene pledgetted horizontal mattress sutures. The anastomoses were all checked again and were hemostatic. TEE showed normal LV and RV systolic function with low filling volumes. There was no sign of aortic dissection. The chest tubes were returned to their previous position.  The sternum was closed with #6 stainless steel wires. The fascia was closed with continuous # 1 vicryl suture. The subcutaneous tissue was closed with 2-0 vicryl continuous suture. The skin was closed with 3-0 vicryl subcuticular suture. All sponge, needle, and instrument counts were reported correct at the end of the case. Dry sterile dressings were placed over the incisions and around the chest tubes which were connected to pleurevac suction.  The patient was then transported to the CV intensive care unit in stable condition.

## 2022-01-10 NOTE — Anesthesia Procedure Notes (Addendum)
Arterial Line Insertion Start/End1/05/2022 7:10 AM, 01/10/2022 7:20 AM Performed by: Mariea Clonts, CRNA, CRNA  Patient location: Pre-op. Preanesthetic checklist: patient identified, IV checked, site marked, risks and benefits discussed, surgical consent, monitors and equipment checked, pre-op evaluation, timeout performed and anesthesia consent Lidocaine 1% used for infiltration Right, radial was placed Catheter size: 20 G Hand hygiene performed  and maximum sterile barriers used  Allen's test indicative of satisfactory collateral circulation Attempts: 2 Procedure performed without using ultrasound guided technique. Following insertion, dressing applied and Biopatch. Post procedure assessment: normal  Patient tolerated the procedure well with no immediate complications.

## 2022-01-10 NOTE — Anesthesia Procedure Notes (Signed)
Central Venous Catheter Insertion Performed by: Annye Asa, MD, anesthesiologist Start/End1/05/2022 6:57 AM, 01/10/2022 7:13 AM Patient location: Pre-op. Preanesthetic checklist: patient identified, IV checked, site marked, risks and benefits discussed, surgical consent, monitors and equipment checked, pre-op evaluation, timeout performed and anesthesia consent Position: supine Lidocaine 1% used for infiltration and patient sedated Hand hygiene performed , maximum sterile barriers used  and Seldinger technique used Catheter size: 8.5 Fr Central line was placed.Sheath introducer Procedure performed using ultrasound guided technique. Ultrasound Notes:anatomy identified, needle tip was noted to be adjacent to the nerve/plexus identified, no ultrasound evidence of intravascular and/or intraneural injection and image(s) printed for medical record Attempts: 1 Following insertion, line sutured, dressing applied and Biopatch. Post procedure assessment: blood return through all ports, free fluid flow and no air  Patient tolerated the procedure well with no immediate complications. Additional procedure comments: CVP: Timeout, sterile prep, drape, FBP R neck. Supine position.  1% lido local, finder and trocar RIJ 1st pass with US guidance. Introducer placed over J wire. Biopatch and sterile dressing on.  Patient tolerated well.  VSS.  Jenita Seashore, MD.

## 2022-01-10 NOTE — Interval H&P Note (Signed)
History and Physical Interval Note:  01/10/2022 7:03 AM  Jason Abbott.  has presented today for surgery, with the diagnosis of AORTIC ARCH ANEURYSM.  The various methods of treatment have been discussed with the patient and family. After consideration of risks, benefits and other options for treatment, the patient has consented to  Procedure(s): AORTIC ARCH DEBRANCHING (N/A) TRANSESOPHAGEAL ECHOCARDIOGRAM (TEE) (N/A) THORACIC AORTIC ENDOVASCULAR STENT GRAFT (N/A) as a surgical intervention.  The patient's history has been reviewed, patient examined, no change in status, stable for surgery.  I have reviewed the patient's chart and labs.  Questions were answered to the patient's satisfaction.     [name]

## 2022-01-10 NOTE — Interval H&P Note (Signed)
History and Physical Interval Note:  01/10/2022 7:31 AM  Jason Abbott.  has presented today for surgery, with the diagnosis of AORTIC ARCH ANEURYSM.  The various methods of treatment have been discussed with the patient and family. After consideration of risks, benefits and other options for treatment, the patient has consented to  Procedure(s): AORTIC ARCH DEBRANCHING (N/A) TRANSESOPHAGEAL ECHOCARDIOGRAM (TEE) (N/A) THORACIC AORTIC ENDOVASCULAR STENT GRAFT (N/A) as a surgical intervention.  The patient's history has been reviewed, patient examined, no change in status, stable for surgery.  I have reviewed the patient's chart and labs.  Questions were answered to the patient's satisfaction.     [name]

## 2022-01-10 NOTE — Progress Notes (Signed)
Patient ID: Jason Nearing., male   DOB: [birth year], 77 y.o.   MRN: [id number]  TCTS Evening Rounds:   Hemodynamically stable on NE 7, Neo 35   Still asleep on vent.  Urine output good  CT output low  CBC    Component Value Date/Time   WBC 35.5 (H) 01/10/2022 1544   RBC 3.96 (L) 01/10/2022 1544   HGB 12.6 (L) 01/10/2022 1551   HGB 14.6 08/26/2021 0943   HCT 37.0 (L) 01/10/2022 1551   PLT 98 (L) 01/10/2022 1544   PLT 146 (L) 08/26/2021 0943   MCV 96.5 01/10/2022 1544   MCH 31.1 01/10/2022 1544   MCHC 32.2 01/10/2022 1544   RDW 13.2 01/10/2022 1544   LYMPHSABS 17.3 (H) 09/13/2021 0712   MONOABS 0.5 09/13/2021 0712   EOSABS 0.0 09/13/2021 0712   BASOSABS 0.0 09/13/2021 0712     BMET    Component Value Date/Time   NA 138 01/10/2022 1551   K 4.3 01/10/2022 1551   CL 101 01/10/2022 1231   CO2 26 01/07/2022 0942   GLUCOSE 152 (H) 01/10/2022 1231   BUN 12 01/10/2022 1231   CREATININE 1.10 01/10/2022 1231   CREATININE 1.04 08/26/2021 0943   CREATININE 1.03 08/15/2021 0000   CALCIUM 9.1 01/07/2022 0942   EGFR 75 08/15/2021 0000   GFRNONAA >60 01/07/2022 0942   GFRNONAA >60 08/26/2021 0943   GFRNONAA 84 03/30/2019 1603     A/P:  Stable postop course. Continue current plans. Wean to extubate as tolerated.

## 2022-01-10 NOTE — Progress Notes (Signed)
Echocardiogram Echocardiogram Transesophageal has been performed.  Jason Abbott 01/10/2022, 8:33 AM

## 2022-01-11 ENCOUNTER — Inpatient Hospital Stay (HOSPITAL_COMMUNITY): Payer: Medicare Other

## 2022-01-11 ENCOUNTER — Encounter (HOSPITAL_COMMUNITY): Payer: Self-pay | Admitting: Surgery

## 2022-01-11 LAB — CBC
HCT: 33.5 % — ABNORMAL LOW (ref 39.0–52.0)
HCT: 31.6 % — ABNORMAL LOW (ref 39.0–52.0)
Hemoglobin: 11.7 g/dL — ABNORMAL LOW (ref 13.0–17.0)
Hemoglobin: 10.3 g/dL — ABNORMAL LOW (ref 13.0–17.0)
MCH: 32.2 pg (ref 26.0–34.0)
MCH: 31 pg (ref 26.0–34.0)
MCHC: 34.9 g/dL (ref 30.0–36.0)
MCHC: 32.6 g/dL (ref 30.0–36.0)
MCV: 92.3 fL (ref 80.0–100.0)
MCV: 95.2 fL (ref 80.0–100.0)
Platelets: 102 10*3/uL — ABNORMAL LOW (ref 150–400)
Platelets: 82 10*3/uL — ABNORMAL LOW (ref 150–400)
RBC: 3.63 MIL/uL — ABNORMAL LOW (ref 4.22–5.81)
RBC: 3.32 MIL/uL — ABNORMAL LOW (ref 4.22–5.81)
RDW: 13.3 % (ref 11.5–15.5)
RDW: 13.6 % (ref 11.5–15.5)
WBC: 26.3 10*3/uL — ABNORMAL HIGH (ref 4.0–10.5)
WBC: 33.7 10*3/uL — ABNORMAL HIGH (ref 4.0–10.5)
nRBC: 0 % (ref 0.0–0.2)
nRBC: 0 % (ref 0.0–0.2)

## 2022-01-11 LAB — BASIC METABOLIC PANEL
Anion gap: 11 (ref 5–15)
Anion gap: 8 (ref 5–15)
BUN: 15 mg/dL (ref 8–23)
BUN: 13 mg/dL (ref 8–23)
CO2: 22 mmol/L (ref 22–32)
CO2: 25 mmol/L (ref 22–32)
Calcium: 7.6 mg/dL — ABNORMAL LOW (ref 8.9–10.3)
Calcium: 7.5 mg/dL — ABNORMAL LOW (ref 8.9–10.3)
Chloride: 101 mmol/L (ref 98–111)
Chloride: 99 mmol/L (ref 98–111)
Creatinine, Ser: 1.03 mg/dL (ref 0.61–1.24)
Creatinine, Ser: 0.96 mg/dL (ref 0.61–1.24)
GFR, Estimated: >60 mL/min (ref >60)
GFR, Estimated: >60 mL/min (ref >60)
Glucose, Bld: 181 mg/dL — ABNORMAL HIGH (ref 70–99)
Glucose, Bld: 140 mg/dL — ABNORMAL HIGH (ref 70–99)
Potassium: 4.2 mmol/L (ref 3.5–5.1)
Potassium: 4 mmol/L (ref 3.5–5.1)
Sodium: 134 mmol/L — ABNORMAL LOW (ref 135–145)
Sodium: 132 mmol/L — ABNORMAL LOW (ref 135–145)

## 2022-01-11 LAB — POCT I-STAT 7, (LYTES, BLD GAS, ICA,H+H)
Acid-Base Excess: 0 mmol/L (ref 0.0–2.0)
Acid-base deficit: 3 mmol/L — ABNORMAL HIGH (ref 0.0–2.0)
Acid-base deficit: 4 mmol/L — ABNORMAL HIGH (ref 0.0–2.0)
Bicarbonate: 24 mmol/L (ref 20.0–28.0)
Bicarbonate: 19 mmol/L — ABNORMAL LOW (ref 20.0–28.0)
Bicarbonate: 24.6 mmol/L (ref 20.0–28.0)
Calcium, Ion: 1.14 mmol/L — ABNORMAL LOW (ref 1.15–1.40)
Calcium, Ion: 1.06 mmol/L — ABNORMAL LOW (ref 1.15–1.40)
Calcium, Ion: 1.1 mmol/L — ABNORMAL LOW (ref 1.15–1.40)
HCT: 36 % — ABNORMAL LOW (ref 39.0–52.0)
HCT: 34 % — ABNORMAL LOW (ref 39.0–52.0)
HCT: 35 % — ABNORMAL LOW (ref 39.0–52.0)
Hemoglobin: 12.2 g/dL — ABNORMAL LOW (ref 13.0–17.0)
Hemoglobin: 11.6 g/dL — ABNORMAL LOW (ref 13.0–17.0)
Hemoglobin: 11.9 g/dL — ABNORMAL LOW (ref 13.0–17.0)
O2 Saturation: 99 %
O2 Saturation: 100 %
O2 Saturation: 97 %
Patient temperature: 97.6
Patient temperature: 38.1
Patient temperature: 38.1
Potassium: 4.2 mmol/L (ref 3.5–5.1)
Potassium: 3.6 mmol/L (ref 3.5–5.1)
Potassium: 4 mmol/L (ref 3.5–5.1)
Sodium: 140 mmol/L (ref 135–145)
Sodium: 141 mmol/L (ref 135–145)
Sodium: 140 mmol/L (ref 135–145)
TCO2: 26 mmol/L (ref 22–32)
TCO2: 20 mmol/L — ABNORMAL LOW (ref 22–32)
TCO2: 26 mmol/L (ref 22–32)
pCO2 arterial: 49.6 mmHg — ABNORMAL HIGH (ref 32–48)
pCO2 arterial: 29.6 mmHg — ABNORMAL LOW (ref 32–48)
pCO2 arterial: 43.2 mmHg (ref 32–48)
pH, Arterial: 7.29 — ABNORMAL LOW (ref 7.35–7.45)
pH, Arterial: 7.421 (ref 7.35–7.45)
pH, Arterial: 7.369 (ref 7.35–7.45)
pO2, Arterial: 178 mmHg — ABNORMAL HIGH (ref 83–108)
pO2, Arterial: 182 mmHg — ABNORMAL HIGH (ref 83–108)
pO2, Arterial: 95 mmHg (ref 83–108)

## 2022-01-11 LAB — BPAM CRYOPRECIPITATE
Blood Product Expiration Date: 202401060012
Blood Product Expiration Date: 202401060025
ISSUE DATE / TIME: 202401051844
ISSUE DATE / TIME: 202401051844
Unit Type and Rh: 5100
Unit Type and Rh: 5100

## 2022-01-11 LAB — PREPARE CRYOPRECIPITATE
Unit division: 0
Unit division: 0

## 2022-01-11 LAB — GLUCOSE, CAPILLARY
Glucose-Capillary: 102 mg/dL — ABNORMAL HIGH (ref 70–99)
Glucose-Capillary: 119 mg/dL — ABNORMAL HIGH (ref 70–99)
Glucose-Capillary: 129 mg/dL — ABNORMAL HIGH (ref 70–99)
Glucose-Capillary: 148 mg/dL — ABNORMAL HIGH (ref 70–99)
Glucose-Capillary: 153 mg/dL — ABNORMAL HIGH (ref 70–99)
Glucose-Capillary: 169 mg/dL — ABNORMAL HIGH (ref 70–99)
Glucose-Capillary: 171 mg/dL — ABNORMAL HIGH (ref 70–99)
Glucose-Capillary: 172 mg/dL — ABNORMAL HIGH (ref 70–99)
Glucose-Capillary: 179 mg/dL — ABNORMAL HIGH (ref 70–99)
Glucose-Capillary: 181 mg/dL — ABNORMAL HIGH (ref 70–99)
Glucose-Capillary: 188 mg/dL — ABNORMAL HIGH (ref 70–99)
Glucose-Capillary: 208 mg/dL — ABNORMAL HIGH (ref 70–99)
Glucose-Capillary: 99 mg/dL (ref 70–99)

## 2022-01-11 LAB — MAGNESIUM
Magnesium: 2 mg/dL (ref 1.7–2.4)
Magnesium: 2.2 mg/dL (ref 1.7–2.4)

## 2022-01-11 MED ORDER — INSULIN DETEMIR 100 UNIT/ML ~~LOC~~ SOLN
20.0000 [IU] | Freq: Once | SUBCUTANEOUS | Status: AC
  Administered 2022-01-11: 20 [IU] via SUBCUTANEOUS
  Filled 2022-01-11: qty 0.2

## 2022-01-11 MED ORDER — METOCLOPRAMIDE HCL 5 MG/ML IJ SOLN
10.0000 mg | Freq: Four times a day (QID) | INTRAMUSCULAR | Status: AC
  Administered 2022-01-11 (×3): 10 mg via INTRAVENOUS
  Filled 2022-01-11 (×3): qty 2

## 2022-01-11 MED ORDER — INSULIN ASPART 100 UNIT/ML IJ SOLN
0.0000 [IU] | INTRAMUSCULAR | Status: DC
  Administered 2022-01-11 (×2): 2 [IU] via SUBCUTANEOUS

## 2022-01-11 NOTE — Progress Notes (Signed)
  Daily Progress Note  S/p:1 Day Post-Op AORTIC ARCH DEBRANCHING WITH 16 X 8 X 10 MM HEMASHIELD GOLD GRAFT, TRANSESOPHAGEAL ECHOCARDIOGRAM (TEE)   Subjective: Slept poorly, some Foley irritation, OOB to chair  Objective: Vitals:   01/11/22 0600 01/11/22 0745  BP: 100/66 113/72  Pulse: 74 71  Resp: (!) 23 10  Temp: 99.5 F (37.5 C) 98.8 F (37.1 C)  SpO2: 100% 99%    Physical Examination Chest dressing dry, chest tube output 330 mL serosanguineous Nonlabored breathing Regular rate Voice normal compared to preop Abdomen soft Mild edema in the extremities, palpable pedal pulses bilaterally Normal affect  ASSESSMENT/PLAN:  01/10/2022 AORTIC ARCH DEBRANCHING WITH 16 X 8 X 10 MM HEMASHIELD GOLD GRAFT, TRANSESOPHAGEAL ECHOCARDIOGRAM (TEE)   Overall doing well postop day 1 OOB as tolerated Appreciate comanagement by Dr. Marvis Moeller MD [facility] [phone] 01/11/2022  8:13 AM

## 2022-01-11 NOTE — Progress Notes (Signed)
1 Day Post-Op Procedure(s) (LRB): AORTIC ARCH DEBRANCHING WITH 16 X 8 X 10 MM HEMASHIELD GOLD GRAFT (N/A) TRANSESOPHAGEAL ECHOCARDIOGRAM (TEE) (N/A) THORACIC AORTIC ENDOVASCULAR STENT GRAFT (N/A) Subjective: Some back pain overnight but better this am. Irritation from foley. Some nausea this am after jello.  NE and neo weaned overnight and almost off.   Extubated without difficulty.   Objective: Vital signs in last 24 hours: Temp:  [98.1 F (36.7 C)-100.6 F (38.1 C)] 99.5 F (37.5 C) (01/06 0600) Pulse Rate:  [62-100] 74 (01/06 0600) Resp:  [11-25] 23 (01/06 0600) BP: (89-145)/(51-93) 100/66 (01/06 0600) SpO2:  [95 %-100 %] 100 % (01/06 0600) Arterial Line BP: (73-153)/(44-91) 130/68 (01/06 0600) FiO2 (%):  [40 %-50 %] 40 % (01/05 1950) Weight:  [91.5 kg] 91.5 kg (01/06 0402)  Hemodynamic parameters for last 24 hours:    Intake/Output from previous day: 01/05 0701 - 01/06 0700 In: 11550.3 [P.O.:240; I.V.:5442.5; Blood:3406; IV Piggyback:2461.8] Out: 3428 [JGOTL:5726; Blood:3500; Chest Tube:490] Intake/Output this shift: No intake/output data recorded.  General appearance: alert and cooperative. Voice seems better to me. Gravelly but strong projection. Sounds to me like he did in the office. Neurologic: intact. Normal strength and sensation in legs. Heart: regular rate and rhythm, S1, S2 normal, no murmur Lungs: clear to auscultation bilaterally Abdomen: soft, non-tender; bowel sounds normal Extremities: edema mild, feet warm, palpable pedal pedal pulses. Wound: dressing dry  Lab Results: Recent Labs    01/10/22 2114 01/11/22 0511  WBC 23.7* 26.3*  HGB 11.9*  12.1* 11.7*  HCT 35.0*  35.5* 33.5*  PLT 96* 102*   BMET:  Recent Labs    01/10/22 2114 01/11/22 0511  NA 140  135 134*  K 4.0  4.0 4.2  CL 104 101  CO2 23 22  GLUCOSE 186* 181*  BUN 13 15  CREATININE 1.19 1.03  CALCIUM 7.7* 7.6*    PT/INR:  Recent Labs    01/10/22 1544  LABPROT 19.3*   INR 1.6*   ABG    Component Value Date/Time   PHART 7.369 01/10/2022 2114   HCO3 24.6 01/10/2022 2114   TCO2 26 01/10/2022 2114   ACIDBASEDEF 4.0 (H) 01/10/2022 2012   O2SAT 97 01/10/2022 2114   CBG (last 3)  Recent Labs    01/11/22 0430 01/11/22 0523 01/11/22 0628  GLUCAP 188* 179* 172*   CXR: mild left base atelectasis. Diaphragm position looks normal to me.  ECG: NSR, old RBBB. No acute changes  Assessment/Plan: S/P Procedure(s) (LRB): AORTIC ARCH DEBRANCHING WITH 16 X 8 X 10 MM HEMASHIELD GOLD GRAFT (N/A) TRANSESOPHAGEAL ECHOCARDIOGRAM (TEE) (N/A) THORACIC AORTIC ENDOVASCULAR STENT GRAFT (N/A)  POD 1 Hemodynamically stable. DC vasopressors and arterial lines. Low dose beta blocker.  Keep chest tubes in today.  Hyperglycemia with no hx of DM and preop Hgb A1c of 5.4. Will give a dose of Levemir today, DC drip and start SSI. Will probably only need for a day or so.  Nausea: reglan.  IS, OOB, ambulate.   LOS: 1 day    Jason Abbott 01/11/2022

## 2022-01-11 NOTE — Progress Notes (Signed)
Patient ID: Jason Nearing., male   DOB: 1945-03-20, 77 y.o.   MRN: [id number]  TCTS Evening Rounds:  Hemodynamically stable in sinus rhythm.   Ambulated short distance but got orthostatic and had to go back on neo for a while.  UO ok CT output low.  CBC    Component Value Date/Time   WBC 33.7 (H) 01/11/2022 1533   RBC 3.32 (L) 01/11/2022 1533   HGB 10.3 (L) 01/11/2022 1533   HGB 14.6 08/26/2021 0943   HCT 31.6 (L) 01/11/2022 1533   PLT 82 (L) 01/11/2022 1533   PLT 146 (L) 08/26/2021 0943   MCV 95.2 01/11/2022 1533   MCH 31.0 01/11/2022 1533   MCHC 32.6 01/11/2022 1533   RDW 13.6 01/11/2022 1533   LYMPHSABS 17.3 (H) 09/13/2021 0712   MONOABS 0.5 09/13/2021 0712   EOSABS 0.0 09/13/2021 0712   BASOSABS 0.0 09/13/2021 0712   BMET    Component Value Date/Time   NA 132 (L) 01/11/2022 1533   K 4.0 01/11/2022 1533   CL 99 01/11/2022 1533   CO2 25 01/11/2022 1533   GLUCOSE 140 (H) 01/11/2022 1533   BUN 13 01/11/2022 1533   CREATININE 0.96 01/11/2022 1533   CREATININE 1.04 08/26/2021 0943   CREATININE 1.03 08/15/2021 0000   CALCIUM 7.5 (L) 01/11/2022 1533   EGFR 75 08/15/2021 0000   GFRNONAA >60 01/11/2022 1533   GFRNONAA >60 08/26/2021 0943   GFRNONAA 84 03/30/2019 1603   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 It was 30.8 preop and consistently in the 20's -30's over the past few months.

## 2022-01-12 ENCOUNTER — Inpatient Hospital Stay (HOSPITAL_COMMUNITY): Payer: Medicare Other

## 2022-01-12 LAB — BASIC METABOLIC PANEL
Anion gap: 7 (ref 5–15)
BUN: 13 mg/dL (ref 8–23)
CO2: 25 mmol/L (ref 22–32)
Calcium: 7.5 mg/dL — ABNORMAL LOW (ref 8.9–10.3)
Chloride: 100 mmol/L (ref 98–111)
Creatinine, Ser: 0.85 mg/dL (ref 0.61–1.24)
GFR, Estimated: >60 mL/min (ref >60)
Glucose, Bld: 121 mg/dL — ABNORMAL HIGH (ref 70–99)
Potassium: 4.3 mmol/L (ref 3.5–5.1)
Sodium: 132 mmol/L — ABNORMAL LOW (ref 135–145)

## 2022-01-12 LAB — GLUCOSE, CAPILLARY
Glucose-Capillary: 103 mg/dL — ABNORMAL HIGH (ref 70–99)
Glucose-Capillary: 104 mg/dL — ABNORMAL HIGH (ref 70–99)
Glucose-Capillary: 117 mg/dL — ABNORMAL HIGH (ref 70–99)
Glucose-Capillary: 118 mg/dL — ABNORMAL HIGH (ref 70–99)
Glucose-Capillary: 119 mg/dL — ABNORMAL HIGH (ref 70–99)
Glucose-Capillary: 99 mg/dL (ref 70–99)

## 2022-01-12 LAB — CBC
HCT: 28.5 % — ABNORMAL LOW (ref 39.0–52.0)
Hemoglobin: 9.6 g/dL — ABNORMAL LOW (ref 13.0–17.0)
MCH: 32.4 pg (ref 26.0–34.0)
MCHC: 33.7 g/dL (ref 30.0–36.0)
MCV: 96.3 fL (ref 80.0–100.0)
Platelets: 78 10*3/uL — ABNORMAL LOW (ref 150–400)
RBC: 2.96 MIL/uL — ABNORMAL LOW (ref 4.22–5.81)
RDW: 13.7 % (ref 11.5–15.5)
WBC: 30.9 10*3/uL — ABNORMAL HIGH (ref 4.0–10.5)
nRBC: 0 % (ref 0.0–0.2)

## 2022-01-12 MED ORDER — ASPIRIN 81 MG PO TBEC
81.0000 mg | DELAYED_RELEASE_TABLET | Freq: Every day | ORAL | Status: DC
  Administered 2022-01-12 – 2022-01-13 (×2): 81 mg via ORAL
  Filled 2022-01-12 (×2): qty 1

## 2022-01-12 MED ORDER — INSULIN ASPART 100 UNIT/ML IJ SOLN: 0.0000 [IU] | Freq: Three times a day (TID) | INTRAMUSCULAR | Status: DC

## 2022-01-12 MED ORDER — FUROSEMIDE 10 MG/ML IJ SOLN
40.0000 mg | Freq: Once | INTRAMUSCULAR | Status: AC
  Administered 2022-01-12: 40 mg via INTRAVENOUS
  Filled 2022-01-12: qty 4

## 2022-01-12 MED ORDER — ASPIRIN 81 MG PO CHEW: 81.0000 mg | CHEWABLE_TABLET | Freq: Every day | ORAL | Status: DC

## 2022-01-12 NOTE — Progress Notes (Signed)
2 Days Post-Op Procedure(s) (LRB): AORTIC ARCH DEBRANCHING WITH 16 X 8 X 10 MM HEMASHIELD GOLD GRAFT (N/A) TRANSESOPHAGEAL ECHOCARDIOGRAM (TEE) (N/A) THORACIC AORTIC ENDOVASCULAR STENT GRAFT (N/A) Subjective:  Feels ok. Some back pain overnight laying down. Incisional discomfort with moving. Ambulated down the hall this am.  Objective: Vital signs in last 24 hours: Temp:  [97.1 F (36.2 C)-99.1 F (37.3 C)] 97.8 F (36.6 C) (01/07 0750) Pulse Rate:  [66-105] 86 (01/07 0700) Cardiac Rhythm: Normal sinus rhythm (01/06 2000) Resp:  [0-34] 21 (01/07 0700) BP: (93-126)/(57-79) 106/72 (01/07 0700) SpO2:  [52 %-100 %] 100 % (01/07 0700) Arterial Line BP: (115-234)/(26-80) 127/70 (01/06 1600) Weight:  [91.2 kg] 91.2 kg (01/07 0332)  Hemodynamic parameters for last 24 hours:    Intake/Output from previous day: 01/06 0701 - 01/07 0700 In: 378.2 [I.V.:278.2; IV Piggyback:100] Out: 717 [Urine:407; Chest Tube:310] Intake/Output this shift: No intake/output data recorded.  General appearance: alert and cooperative Neurologic: intact Heart: regular rate and rhythm Lungs: clear to auscultation bilaterally Extremities: edema mild Wound: dressing dry  Lab Results: Recent Labs    01/11/22 1533 01/12/22 0428  WBC 33.7* 30.9*  HGB 10.3* 9.6*  HCT 31.6* 28.5*  PLT 82* 78*   BMET:  Recent Labs    01/11/22 1533 01/12/22 0428  NA 132* 132*  K 4.0 4.3  CL 99 100  CO2 25 25  GLUCOSE 140* 121*  BUN 13 13  CREATININE 0.96 0.85  CALCIUM 7.5* 7.5*    PT/INR:  Recent Labs    01/10/22 1544  LABPROT 19.3*  INR 1.6*   ABG    Component Value Date/Time   PHART 7.369 01/10/2022 2114   HCO3 24.6 01/10/2022 2114   TCO2 26 01/10/2022 2114   ACIDBASEDEF 4.0 (H) 01/10/2022 2012   O2SAT 97 01/10/2022 2114   CBG (last 3)  Recent Labs    01/12/22 0024 01/12/22 0325 01/12/22 0746  GLUCAP 103* 104* 99   CXR: mild left base atelectasis  Assessment/Plan: S/P Procedure(s)  (LRB): AORTIC ARCH DEBRANCHING WITH 16 X 8 X 10 MM HEMASHIELD GOLD GRAFT (N/A) TRANSESOPHAGEAL ECHOCARDIOGRAM (TEE) (N/A) THORACIC AORTIC ENDOVASCULAR STENT GRAFT (N/A)  POD 2 Hemodynamically stable in sinus rhythm. Continue low dose Lopressor.  Chest tube output 310 cc yesterday and seems to be decreasing. It is thin serosanguinous. Will keep tubes in this am and reevaluate later today.   Labs ok. Leukocytosis is chronic and likely related to CLL. Thrombocytopenia probably related to heparin for surgery and will recover over the next week.  Glucose under good control: continue SSI today.  IS, OOB, mobilize.   LOS: 2 days    [name] 01/12/2022

## 2022-01-12 NOTE — Progress Notes (Signed)
  Daily Progress Note  S/p:2 Days Post-Op AORTIC ARCH DEBRANCHING WITH 16 X 8 X 10 MM HEMASHIELD GOLD GRAFT, TRANSESOPHAGEAL ECHOCARDIOGRAM (TEE)   Subjective: Better sleep, working to take deep breaths   Objective: Vitals:   01/12/22 0700 01/12/22 0750  BP: 106/72   Pulse: 86   Resp: (!) 21   Temp:  97.8 F (36.6 C)  SpO2: 100%     Physical Examination Chest dressing dry, chest tube output 150~ mL serosanguineous - slowing Nonlabored breathing - 2L [place],  Regular rate Voice normal compared to preop Abdomen soft Mild edema in the extremities, palpable pedal pulses bilaterally, palpable radial pulses Normal affect  ASSESSMENT/PLAN:  01/10/2022 AORTIC ARCH DEBRANCHING WITH 16 X 8 X 10 MM HEMASHIELD GOLD GRAFT, TRANSESOPHAGEAL ECHOCARDIOGRAM (TEE)   Overall doing well 2 Days Post-Op  OOB as tolerated Appreciate comanagement by Dr. Cyndia Bent Leukocytosis with known CLL - baseline    Cassandria Santee MD MS [facility] [phone] 01/12/2022  7:58 AM

## 2022-01-12 NOTE — Progress Notes (Signed)
Patient ID: Jason Abbott., male   DOB: April 09, 1945, 77 y.o.   MRN: [id number]  TCTS Evening Rounds:  Hemodynamically stable in sinus rhythm today.  Ambulated well.  CT output decreasing. Will remove in am.

## 2022-01-13 ENCOUNTER — Inpatient Hospital Stay (HOSPITAL_COMMUNITY): Payer: Medicare Other

## 2022-01-13 ENCOUNTER — Other Ambulatory Visit: Payer: Self-pay

## 2022-01-13 DIAGNOSIS — M7989 Other specified soft tissue disorders: Secondary | ICD-10-CM | POA: Diagnosis not present

## 2022-01-13 LAB — URINALYSIS, COMPLETE (UACMP) WITH MICROSCOPIC
Bilirubin Urine: NEGATIVE
Glucose, UA: NEGATIVE mg/dL
Ketones, ur: NEGATIVE mg/dL
Leukocytes,Ua: NEGATIVE
Nitrite: NEGATIVE
Protein, ur: NEGATIVE mg/dL
Specific Gravity, Urine: 1.01 (ref 1.005–1.030)
pH: 5 (ref 5.0–8.0)

## 2022-01-13 LAB — CBC
HCT: 28.1 % — ABNORMAL LOW (ref 39.0–52.0)
Hemoglobin: 8.9 g/dL — ABNORMAL LOW (ref 13.0–17.0)
MCH: 31.2 pg (ref 26.0–34.0)
MCHC: 31.7 g/dL (ref 30.0–36.0)
MCV: 98.6 fL (ref 80.0–100.0)
Platelets: 91 10*3/uL — ABNORMAL LOW (ref 150–400)
RBC: 2.85 MIL/uL — ABNORMAL LOW (ref 4.22–5.81)
RDW: 13.7 % (ref 11.5–15.5)
WBC: 29.6 10*3/uL — ABNORMAL HIGH (ref 4.0–10.5)
nRBC: 0 % (ref 0.0–0.2)

## 2022-01-13 LAB — BASIC METABOLIC PANEL
Anion gap: 5 (ref 5–15)
BUN: 12 mg/dL (ref 8–23)
CO2: 32 mmol/L (ref 22–32)
Calcium: 7.7 mg/dL — ABNORMAL LOW (ref 8.9–10.3)
Chloride: 97 mmol/L — ABNORMAL LOW (ref 98–111)
Creatinine, Ser: 0.87 mg/dL (ref 0.61–1.24)
GFR, Estimated: >60 mL/min (ref >60)
Glucose, Bld: 113 mg/dL — ABNORMAL HIGH (ref 70–99)
Potassium: 4 mmol/L (ref 3.5–5.1)
Sodium: 134 mmol/L — ABNORMAL LOW (ref 135–145)

## 2022-01-13 LAB — GLUCOSE, CAPILLARY: Glucose-Capillary: 96 mg/dL (ref 70–99)

## 2022-01-13 MED ORDER — ONDANSETRON HCL 4 MG/2ML IJ SOLN: 4.0000 mg | Freq: Four times a day (QID) | INTRAMUSCULAR | Status: DC | PRN

## 2022-01-13 MED ORDER — FUROSEMIDE 10 MG/ML IJ SOLN
40.0000 mg | Freq: Once | INTRAMUSCULAR | Status: AC
  Administered 2022-01-13: 40 mg via INTRAVENOUS
  Filled 2022-01-13: qty 4

## 2022-01-13 MED ORDER — FUROSEMIDE 40 MG PO TABS
40.0000 mg | ORAL_TABLET | Freq: Every day | ORAL | Status: DC
  Administered 2022-01-14: 40 mg via ORAL
  Filled 2022-01-13: qty 1

## 2022-01-13 MED ORDER — ~~LOC~~ CARDIAC SURGERY, PATIENT & FAMILY EDUCATION: Freq: Once | Status: AC

## 2022-01-13 MED ORDER — ASPIRIN 81 MG PO TBEC
81.0000 mg | DELAYED_RELEASE_TABLET | Freq: Every day | ORAL | Status: DC
  Administered 2022-01-14 – 2022-01-21 (×8): 81 mg via ORAL
  Filled 2022-01-13 (×8): qty 1

## 2022-01-13 MED ORDER — METOPROLOL TARTRATE 12.5 MG HALF TABLET
12.5000 mg | ORAL_TABLET | Freq: Two times a day (BID) | ORAL | Status: DC
  Administered 2022-01-13: 12.5 mg via ORAL
  Filled 2022-01-13: qty 1

## 2022-01-13 MED ORDER — SODIUM CHLORIDE 0.9% FLUSH
3.0000 mL | Freq: Two times a day (BID) | INTRAVENOUS | Status: DC
  Administered 2022-01-13 – 2022-01-20 (×16): 3 mL via INTRAVENOUS

## 2022-01-13 MED ORDER — CHLORHEXIDINE GLUCONATE CLOTH 2 % EX PADS
6.0000 | MEDICATED_PAD | Freq: Every day | CUTANEOUS | Status: DC
  Administered 2022-01-13 – 2022-01-15 (×5): 6 via TOPICAL

## 2022-01-13 MED ORDER — SODIUM CHLORIDE 0.9% FLUSH: 3.0000 mL | INTRAVENOUS | Status: DC | PRN

## 2022-01-13 MED ORDER — POTASSIUM CHLORIDE CRYS ER 20 MEQ PO TBCR
20.0000 meq | EXTENDED_RELEASE_TABLET | Freq: Three times a day (TID) | ORAL | Status: AC
  Administered 2022-01-13 (×3): 20 meq via ORAL
  Filled 2022-01-13 (×3): qty 1

## 2022-01-13 MED ORDER — SENNOSIDES-DOCUSATE SODIUM 8.6-50 MG PO TABS
1.0000 | ORAL_TABLET | Freq: Two times a day (BID) | ORAL | Status: DC | PRN
  Administered 2022-01-15: 1 via ORAL
  Filled 2022-01-13: qty 1

## 2022-01-13 MED ORDER — SODIUM CHLORIDE 0.9 % IV SOLN: 250.0000 mL | INTRAVENOUS | Status: DC | PRN

## 2022-01-13 MED ORDER — POTASSIUM CHLORIDE CRYS ER 20 MEQ PO TBCR
20.0000 meq | EXTENDED_RELEASE_TABLET | Freq: Two times a day (BID) | ORAL | Status: DC
  Administered 2022-01-14: 20 meq via ORAL
  Filled 2022-01-13: qty 1

## 2022-01-13 MED ORDER — ONDANSETRON HCL 4 MG PO TABS: 4.0000 mg | ORAL_TABLET | Freq: Four times a day (QID) | ORAL | Status: DC | PRN

## 2022-01-13 MED FILL — Thrombin (Recombinant) For Soln 20000 Unit: CUTANEOUS | Qty: 1 | Status: AC

## 2022-01-13 NOTE — Progress Notes (Signed)
Upper extremity venous duplex has been completed.   Preliminary results in CV Proc.   Jason Abbott 01/13/2022 1:41 PM

## 2022-01-13 NOTE — Progress Notes (Addendum)
  Progress Note    01/13/2022 8:05 AM 3 Days Post-Op  Subjective:  no complaints this morning; sitting up in the chair   Vitals:   01/13/22 0500 01/13/22 0600  BP: 120/65 111/71  Pulse: 73 84  Resp: 14 (!) 27  Temp:  98 F (36.7 C)  SpO2: 99% 96%   Physical Exam: Lungs:  non labored Extremities:  palpable ATA pulses, palpable radial pulses Neurologic: A&O  CBC    Component Value Date/Time   WBC 29.6 (H) 01/13/2022 0419   RBC 2.85 (L) 01/13/2022 0419   HGB 8.9 (L) 01/13/2022 0419   HGB 14.6 08/26/2021 0943   HCT 28.1 (L) 01/13/2022 0419   PLT 91 (L) 01/13/2022 0419   PLT 146 (L) 08/26/2021 0943   MCV 98.6 01/13/2022 0419   MCH 31.2 01/13/2022 0419   MCHC 31.7 01/13/2022 0419   RDW 13.7 01/13/2022 0419   LYMPHSABS 17.3 (H) 09/13/2021 0712   MONOABS 0.5 09/13/2021 0712   EOSABS 0.0 09/13/2021 0712   BASOSABS 0.0 09/13/2021 0712    BMET    Component Value Date/Time   NA 134 (L) 01/13/2022 0419   K 4.0 01/13/2022 0419   CL 97 (L) 01/13/2022 0419   CO2 32 01/13/2022 0419   GLUCOSE 113 (H) 01/13/2022 0419   BUN 12 01/13/2022 0419   CREATININE 0.87 01/13/2022 0419   CREATININE 1.04 08/26/2021 0943   CREATININE 1.03 08/15/2021 0000   CALCIUM 7.7 (L) 01/13/2022 0419   GFRNONAA >60 01/13/2022 0419   GFRNONAA >60 08/26/2021 0943   GFRNONAA 84 03/30/2019 1603   GFRAA >60 08/22/2019 1011   GFRAA 97 03/30/2019 1603    INR    Component Value Date/Time   INR 1.6 (H) 01/10/2022 1544     Intake/Output Summary (Last 24 hours) at 01/13/2022 0805 Last data filed at 01/13/2022 0600 Gross per 24 hour  Intake 480 ml  Output 2945 ml  Net -2465 ml     Assessment/Plan:  77 y.o. male is s/p AORTIC ARCH DEBRANCHING WITH 16 X 8 X 10 MM HEMASHIELD GOLD GRAFT, TRANSESOPHAGEAL ECHOCARDIOGRAM (TEE)   3 Days Post-Op   Distal pulses symmetrical and intact; moving all extremities well Plans noted for L arm venous duplex to rule out DVT Foley out, UA ordered OOB as  tolerated   Dagoberto Ligas, PA-C [facility] [phone] 01/13/2022 8:05 AM  I agree with the above.  Have seen and evaluated the patient. -Ultrasound to evaluate left arm swelling and possible innominate vein thrombus -Check UA given history of chronic urinary tract infections -Chest tubes out today -Likely transfer to floor -Continue with mobilization  Wells Michaelangelo Mittelman

## 2022-01-13 NOTE — Progress Notes (Signed)
     [address]       [place] [address]             [phone]       EVENING ROUNDS  SP Debranching/TAA repair Doing well

## 2022-01-13 NOTE — TOC Initial Note (Signed)
Transition of Care (TOC) - Initial/Assessment Note    Patient Details  Name: Jason Abbott. 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 PTA patient states he was independent from home with spouse. Patient states he has a cane, rolling walker, and shower chair in the home. Case Manager will continue to follow for transition of care needs as the patient progresses.                  Expected Discharge Plan: Home/Self Care Barriers to Discharge: Continued Medical Work up   Patient Goals and CMS Choice Patient states their goals for this hospitalization and ongoing recovery are:: to return home.   Expected Discharge Plan and Services In-house Referral: NA Discharge Planning Services: CM Consult   Living arrangements for the past 2 months: [place]                   DME Agency: NA  Prior Living Arrangements/Services Living arrangements for the past 2 months: [place] Lives with:: Spouse Patient language and need for interpreter reviewed:: Yes        Need for Family Participation in Patient Care: Yes (Comment) Care giver support system in place?: Yes (comment)   Criminal Activity/Legal Involvement Pertinent to Current Situation/Hospitalization: No - Comment as needed  Permission Sought/Granted Permission sought to share information with : Case Manager, Family Supports     Emotional Assessment Appearance:: Appears stated age Attitude/Demeanor/Rapport: Engaged Affect (typically observed): Appropriate Orientation: : Oriented to Self, Oriented to Place, Oriented to  Time, Oriented to Situation Alcohol / Substance Use: Not Applicable Psych Involvement: No (comment)  Admission diagnosis:  S/P thoracic aortic aneurysm repair [Q67.619, Z86.79] Patient  Active Problem List   Diagnosis Date Noted   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   Urinary tract infection 01/03/2022   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   Hyponatremia 09/13/2021   Hypoalbuminemia 09/13/2021   BPH (benign prostatic hyperplasia) 09/13/2021   Leg cramping 09/13/2021   Acute respiratory failure with hypoxia ([place]) 09/09/2021   Thrombocytopenia ([place]) 09/09/2021   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   Ingrown right greater toenail 08/12/2021   Chronic headaches 08/12/2021   Right hand pain 03/11/2021   Tinnitus 03/11/2021   Elevated TSH 09/12/2020   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   Community acquired bilateral lower lobe pneumonia 03/16/2019   CLL (chronic lymphocytic leukemia) ([place]) 02/23/2019   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Phone: [phone]: [phone] Social Determinants of Health (SDOH) Social History: [place] No Food Insecurity (12/20/2021)  Housing: Low Risk  (12/20/2021)  Transportation Needs: No Transportation Needs (12/20/2021)  Depression (PHQ2-9): Low Risk  (02/28/2020)  Financial Resource Strain: Low Risk  (09/16/2021)  Tobacco Use: Medium Risk (01/11/2022)   Readmission Risk Interventions    09/13/2021   11:06 AM  Readmission Risk  Prevention Plan  Transportation Screening Complete  PCP or Specialist Appt within 5-7 Days Complete  Home Care Screening Complete  Medication Review (RN CM) Complete

## 2022-01-13 NOTE — Progress Notes (Signed)
3 Days Post-Op Procedure(s) (LRB): AORTIC ARCH DEBRANCHING WITH 16 X 8 X 10 MM HEMASHIELD GOLD GRAFT (N/A) TRANSESOPHAGEAL ECHOCARDIOGRAM (TEE) (N/A) THORACIC AORTIC ENDOVASCULAR STENT GRAFT (N/A) Subjective: No complaints. Feels ok  Objective: Vital signs in last 24 hours: Temp:  [97.7 F (36.5 C)-98.1 F (36.7 C)] 98 F (36.7 C) (01/08 0600) Pulse Rate:  [66-99] 84 (01/08 0600) Cardiac Rhythm: Normal sinus rhythm (01/08 0400) Resp:  [11-29] 27 (01/08 0600) BP: (93-133)/(63-78) 111/71 (01/08 0600) SpO2:  [92 %-100 %] 96 % (01/08 0600) Weight:  [90.1 kg] 90.1 kg (01/08 0300)  Hemodynamic parameters for last 24 hours:    Intake/Output from previous day: 01/07 0701 - 01/08 0700 In: 720 [P.O.:720] Out: 3015 [Urine:2815; Chest Tube:200] Intake/Output this shift: No intake/output data recorded.  General appearance: alert and cooperative Neurologic: intact Heart: regular rate and rhythm Lungs: clear to auscultation bilaterally Extremities: edema mild in extremities but seems more in left upper than right upper. Wound: incision ok  Lab Results: Recent Labs    01/12/22 0428 01/13/22 0419  WBC 30.9* 29.6*  HGB 9.6* 8.9*  HCT 28.5* 28.1*  PLT 78* 91*   BMET:  Recent Labs    01/12/22 0428 01/13/22 0419  NA 132* 134*  K 4.3 4.0  CL 100 97*  CO2 25 32  GLUCOSE 121* 113*  BUN 13 12  CREATININE 0.85 0.87  CALCIUM 7.5* 7.7*    PT/INR:  Recent Labs    01/10/22 1544  LABPROT 19.3*  INR 1.6*   ABG    Component Value Date/Time   PHART 7.369 01/10/2022 2114   HCO3 24.6 01/10/2022 2114   TCO2 26 01/10/2022 2114   ACIDBASEDEF 4.0 (H) 01/10/2022 2012   O2SAT 97 01/10/2022 2114   CBG (last 3)  Recent Labs    01/12/22 1550 01/12/22 2136 01/13/22 0615  GLUCAP 119* 117* 96    Assessment/Plan: S/P Procedure(s) (LRB): AORTIC ARCH DEBRANCHING WITH 16 X 8 X 10 MM HEMASHIELD GOLD GRAFT (N/A) TRANSESOPHAGEAL ECHOCARDIOGRAM (TEE) (N/A) THORACIC AORTIC  ENDOVASCULAR STENT GRAFT (N/A)  POD 3  Hemodynamically stable in sinus rhythm. Continue Lopressor.  Volume excess: continue diuresis.  LUE>RUE edema: will check venous duplex.  DC chest tubes, central line.  Leukocytosis: will check UA and UC given recent UTI that was treated with antibiotic.  Continue IS, ambulation.  Transfer to 4E.   LOS: 3 days    [name] 01/13/2022

## 2022-01-13 NOTE — Progress Notes (Signed)
CARDIAC REHAB PHASE I   PRE:  Rate/Rhythm: 76 sR    BP: sitting 110/76    SaO2: 97 1L, 95 RA  MODE:  Ambulation: 290 ft   POST:  Rate/Rhythm: 73 SR    BP: sitting 121/81     SaO2: 97 RA  Pt c/o grogginess after CTs pulled and meds given. Stood with min assist, ambulated with [place], contact guard. Slow pace, mild sway with [place].  Fatigued after walk but able to walk without O2. To recliner. C/o congestion without production. Encouraged coughing and IS however pt did not cough. Practiced IS, 1500 ml. Encouraged another walk.  [id number]  Yves Dill BS, ACSM-CEP 01/13/2022 11:19 AM

## 2022-01-14 ENCOUNTER — Inpatient Hospital Stay (HOSPITAL_COMMUNITY): Payer: Medicare Other

## 2022-01-14 LAB — TYPE AND SCREEN
ABO/RH(D): O POS
ABO/RH(D): O POS
Antibody Screen: POSITIVE
Antibody Screen: POSITIVE
DAT, IgG: POSITIVE
Unit division: 0
Unit division: 0
Unit division: 0
Unit division: 0
Unit division: 0
Unit division: 0
Unit division: 0
Unit division: 0
Unit division: 0
Unit division: 0

## 2022-01-14 LAB — BPAM RBC
Blood Product Expiration Date: 202402102359
Blood Product Expiration Date: 202402102359
Blood Product Expiration Date: 202402102359
Blood Product Expiration Date: 202402102359
Blood Product Expiration Date: 202402102359
Blood Product Expiration Date: 202402102359
Blood Product Expiration Date: 202402012359
Blood Product Expiration Date: 202402012359
Blood Product Expiration Date: 202402012359
Blood Product Expiration Date: 202402012359
ISSUE DATE / TIME: 202401050946
ISSUE DATE / TIME: 202401050946
ISSUE DATE / TIME: 202401050946
ISSUE DATE / TIME: 202401050946
ISSUE DATE / TIME: 202401051443
ISSUE DATE / TIME: 202401051443
Unit Type and Rh: 5100
Unit Type and Rh: 5100
Unit Type and Rh: 5100
Unit Type and Rh: 5100
Unit Type and Rh: 5100
Unit Type and Rh: 5100
Unit Type and Rh: 5100
Unit Type and Rh: 5100
Unit Type and Rh: 5100
Unit Type and Rh: 5100

## 2022-01-14 LAB — CBC
HCT: 28.2 % — ABNORMAL LOW (ref 39.0–52.0)
Hemoglobin: 9 g/dL — ABNORMAL LOW (ref 13.0–17.0)
MCH: 31.1 pg (ref 26.0–34.0)
MCHC: 31.9 g/dL (ref 30.0–36.0)
MCV: 97.6 fL (ref 80.0–100.0)
Platelets: 107 10*3/uL — ABNORMAL LOW (ref 150–400)
RBC: 2.89 MIL/uL — ABNORMAL LOW (ref 4.22–5.81)
RDW: 13.5 % (ref 11.5–15.5)
WBC: 29.7 10*3/uL — ABNORMAL HIGH (ref 4.0–10.5)
nRBC: 0 % (ref 0.0–0.2)

## 2022-01-14 LAB — BASIC METABOLIC PANEL
Anion gap: 6 (ref 5–15)
BUN: 13 mg/dL (ref 8–23)
CO2: 30 mmol/L (ref 22–32)
Calcium: 8 mg/dL — ABNORMAL LOW (ref 8.9–10.3)
Chloride: 97 mmol/L — ABNORMAL LOW (ref 98–111)
Creatinine, Ser: 0.89 mg/dL (ref 0.61–1.24)
GFR, Estimated: >60 mL/min (ref >60)
Glucose, Bld: 110 mg/dL — ABNORMAL HIGH (ref 70–99)
Potassium: 4.3 mmol/L (ref 3.5–5.1)
Sodium: 133 mmol/L — ABNORMAL LOW (ref 135–145)

## 2022-01-14 LAB — URINE CULTURE
Culture: NO GROWTH
Special Requests: NORMAL

## 2022-01-14 LAB — MAGNESIUM: Magnesium: 2 mg/dL (ref 1.7–2.4)

## 2022-01-14 MED ORDER — METOPROLOL TARTRATE 25 MG PO TABS
25.0000 mg | ORAL_TABLET | Freq: Two times a day (BID) | ORAL | Status: DC
  Administered 2022-01-14: 25 mg via ORAL
  Filled 2022-01-14: qty 1

## 2022-01-14 MED ORDER — METOPROLOL TARTRATE 12.5 MG HALF TABLET
12.5000 mg | ORAL_TABLET | Freq: Two times a day (BID) | ORAL | Status: DC
  Administered 2022-01-14 – 2022-01-21 (×14): 12.5 mg via ORAL
  Filled 2022-01-14 (×14): qty 1

## 2022-01-14 NOTE — Progress Notes (Signed)
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 GOLD GRAFT (N/A) TRANSESOPHAGEAL ECHOCARDIOGRAM (TEE) (N/A) THORACIC AORTIC ENDOVASCULAR STENT GRAFT (N/A) Subjective: Feels tired and weak. He did walk around the ICU this am.  Objective: Vital signs in last 24 hours: Temp:  [98.1 F (36.7 C)-98.7 F (37.1 C)] 98.4 F (36.9 C) (01/09 0300) Pulse Rate:  [63-86] 76 (01/09 0900) Cardiac Rhythm: Normal sinus rhythm (01/09 0800) Resp:  [12-28] 15 (01/09 0900) BP: (85-128)/(57-85) 100/60 (01/09 0700) SpO2:  [90 %-99 %] 95 % (01/09 0900) Weight:  [87.9 kg] 87.9 kg (01/09 0500)  Hemodynamic parameters for last 24 hours:    Intake/Output from previous day: 01/08 0701 - 01/09 0700 In: -  Out: 2300 [Urine:2300] Intake/Output this shift: No intake/output data recorded.  General appearance: alert and cooperative Neurologic: intact Heart: regular rate and rhythm, S1, S2 normal, no murmur Lungs: clear to auscultation bilaterally Extremities: extremities normal, atraumatic, no cyanosis or edema Wound: incision healing well  Lab Results: Recent Labs    01/13/22 0419 01/14/22 0451  WBC 29.6* 29.7*  HGB 8.9* 9.0*  HCT 28.1* 28.2*  PLT 91* 107*   BMET:  Recent Labs    01/13/22 0419 01/14/22 0451  NA 134* 133*  K 4.0 4.3  CL 97* 97*  CO2 32 30  GLUCOSE 113* 110*  BUN 12 13  CREATININE 0.87 0.89  CALCIUM 7.7* 8.0*    PT/INR: No results for input(s): "LABPROT", "INR" in the last 72 hours. ABG    Component Value Date/Time   PHART 7.369 01/10/2022 2114   HCO3 24.6 01/10/2022 2114   TCO2 26 01/10/2022 2114   ACIDBASEDEF 4.0 (H) 01/10/2022 2012   O2SAT 97 01/10/2022 2114   CBG (last 3)  Recent Labs    01/12/22 1550 01/12/22 2136 01/13/22 0615  GLUCAP 119* 117* 96   CXR: left basilar atelectasis  Assessment/Plan: S/P Procedure(s) (LRB): AORTIC ARCH DEBRANCHING WITH 16 X 8 X 10 MM HEMASHIELD GOLD GRAFT  (N/A) TRANSESOPHAGEAL ECHOCARDIOGRAM (TEE) (N/A) THORACIC AORTIC ENDOVASCULAR STENT GRAFT (N/A)  POD 4  Hemodynamically stable in sinus rhythm. Will decrease Lopressor to 12.5 bid since BP low normal.  Wt is back to baseline. DC lasix and KCL  LUE duplex negative for DVT.  Awaiting bed on 4E. Continue IS, ambulation.   LOS: 4 days    [name] 01/14/2022

## 2022-01-14 NOTE — Progress Notes (Signed)
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 [place] GRAFT (N/A) TRANSESOPHAGEAL ECHOCARDIOGRAM (TEE) (N/A) THORACIC AORTIC ENDOVASCULAR STENT GRAFT (N/A) Subjective: Feels well  Objective: Vital signs in last 24 hours: Temp:  [97.8 F (36.6 C)-98.7 F (37.1 C)] 98.4 F (36.9 C) (01/09 0300) Pulse Rate:  [63-99] 75 (01/09 0700) Cardiac Rhythm: Normal sinus rhythm (01/09 0400) Resp:  [12-36] 14 (01/09 0700) BP: (85-128)/(57-85) 100/60 (01/09 0700) SpO2:  [90 %-99 %] 98 % (01/09 0700) FiO2 (%):  [24 %] 24 % (01/08 0807) Weight:  [87.9 kg] 87.9 kg (01/09 0500)  Hemodynamic parameters for last 24 hours:    Intake/Output from previous day: 01/08 0701 - 01/09 0700 In: -  Out: 2300 [Urine:2300] Intake/Output this shift: No intake/output data recorded.  General appearance: alert, cooperative, and no distress Heart: regular rate and rhythm and soft systolic murmur Lungs: clear Abdomen: soft, non tender, non dist Extremities: + ankle edema, palp PT bilat Wound: healing well without signs of infection  Lab Results: Recent Labs    01/13/22 0419 01/14/22 0451  WBC 29.6* 29.7*  HGB 8.9* 9.0*  HCT 28.1* 28.2*  PLT 91* 107*   BMET:  Recent Labs    01/13/22 0419 01/14/22 0451  NA 134* 133*  K 4.0 4.3  CL 97* 97*  CO2 32 30  GLUCOSE 113* 110*  BUN 12 13  CREATININE 0.87 0.89  CALCIUM 7.7* 8.0*    PT/INR: No results for input(s): "LABPROT", "INR" in the last 72 hours. ABG    Component Value Date/Time   PHART 7.369 01/10/2022 2114   HCO3 24.6 01/10/2022 2114   TCO2 26 01/10/2022 2114   ACIDBASEDEF 4.0 (H) 01/10/2022 2012   O2SAT 97 01/10/2022 2114   CBG (last 3)  Recent Labs    01/12/22 1550 01/12/22 2136 01/13/22 0615  GLUCAP 119* 117* 96    Meds Scheduled Meds:  aspirin EC  81 mg Oral Daily   Chlorhexidine Gluconate Cloth  6 each Topical Daily   furosemide  40 mg Oral Daily   metoprolol tartrate  12.5 mg  Oral BID   mometasone-formoterol  2 puff Inhalation BID   And   umeclidinium bromide  1 puff Inhalation Daily   pantoprazole  40 mg Oral Daily   potassium chloride  20 mEq Oral BID   sodium chloride flush  3 mL Intravenous Q12H   tamsulosin  0.4 mg Oral q AM   Continuous Infusions:  sodium chloride     PRN Meds:.sodium chloride, ondansetron **OR** ondansetron (ZOFRAN) IV, mouth rinse, oxyCODONE, senna-docusate, sodium chloride flush, traMADol  Xrays VAS [place] UPPER EXTREMITY VENOUS DUPLEX  Result Date: 01/13/2022 UPPER VENOUS STUDY  Patient Name:  Jason Abbott.  Date of Exam:   01/13/2022 Medical Rec #: [id number]            Accession #:    [id number] Date of Birth: 1945/04/05            Patient Gender: M Patient Age:   77 years Exam Location:  [facility] Procedure:      VAS [place] UPPER EXTREMITY VENOUS DUPLEX Referring Phys: Gilford Raid --------------------------------------------------------------------------------  Indications: left arm swelling after aortic arch de-branching Comparison Study: no prior Performing Technologist: Archie Patten RVS  Examination Guidelines: A complete evaluation includes B-mode imaging, spectral Doppler, color Doppler, and power Doppler as needed of all accessible portions of each vessel. Bilateral testing is considered an integral part of a  complete examination. Limited examinations for reoccurring indications may be performed as noted.  Right Findings: +----------+------------+---------+-----------+----------+-------+ RIGHT     CompressiblePhasicitySpontaneousPropertiesSummary +----------+------------+---------+-----------+----------+-------+ Subclavian               Yes       Yes                      +----------+------------+---------+-----------+----------+-------+  Left Findings: +----------+------------+---------+-----------+----------+-------+ LEFT      CompressiblePhasicitySpontaneousPropertiesSummary  +----------+------------+---------+-----------+----------+-------+ IJV           Full       Yes       Yes                      +----------+------------+---------+-----------+----------+-------+ Subclavian    Full       Yes       Yes                      +----------+------------+---------+-----------+----------+-------+ Axillary      Full       Yes       Yes                      +----------+------------+---------+-----------+----------+-------+ Brachial      Full       Yes       Yes                      +----------+------------+---------+-----------+----------+-------+ Radial        Full                                          +----------+------------+---------+-----------+----------+-------+ Ulnar         Full                                          +----------+------------+---------+-----------+----------+-------+ Cephalic      Full                                          +----------+------------+---------+-----------+----------+-------+ Basilic       Full                                          +----------+------------+---------+-----------+----------+-------+  Summary:  Right: No evidence of thrombosis in the subclavian.  Left: No evidence of deep vein thrombosis in the upper extremity. No evidence of superficial vein thrombosis in the upper extremity.  *See table(s) above for measurements and observations.  Diagnosing physician: Monica Martinez MD Electronically signed by Monica Martinez MD on 01/13/2022 at 2:57:42 PM.    Final    DG CHEST PORT 1 VIEW  Result Date: 01/13/2022 CLINICAL DATA:  Aortic aneurysm repair. EXAM: PORTABLE CHEST 1 VIEW COMPARISON:  01/12/2022 and CT chest 09/09/2021. FINDINGS: Right IJ Swan-Ganz catheter tip terminates in the low right internal jugular vein or brachiocephalic vein junction, stable. Trachea is midline. Heart is enlarged. Endovascular stent graft spans the ascending, transverse and proximal descending thoracic  aorta, as before. Calcified mediastinal lymph nodes. Mediastinal drain and left chest tube  remain in place. Biapical pleural thickening, left greater than right. Tiny right and small left pleural effusions. No definite pneumothorax. Bibasilar atelectasis, left greater than right. IMPRESSION: 1. Bibasilar atelectasis, left greater than right. 2. Tiny right and small left pleural effusions. 3. No pneumothorax. Electronically Signed   By: Lorin Picket M.D.   On: 01/13/2022 08:25    Assessment/Plan: S/P Procedure(s) (LRB): AORTIC ARCH DEBRANCHING WITH 16 X 8 X 10 MM HEMASHIELD [place] GRAFT (N/A) TRANSESOPHAGEAL ECHOCARDIOGRAM (TEE) (N/A) THORACIC AORTIC ENDOVASCULAR STENT GRAFT (N/A)  POD#4  1 afeb, s BP 85-128 range, sinus rhythm, short bursts of supravent tachy, some PVC's- will increase metoprolol dose to 25 BID, add magnesium to labs, K+ is ok 2 O2 sats good on 1 liter [place] 3 excellent UOP 4 weight up approx 3 kg from preop, cont daily lasix 5 blood sugars well controlled, not a diabetic 6 minor hyponatremia, stable 7 normal renal fxn 9 leukocytosis , stable, UA appears neg- microscopic hematuria, rare bacteria but negative  leuk esterase and nitrite 10 thrombocytopenia trend improving 11 expected ABLA is stable 12 minor left effus/atx 13 routine pulm hygiene and rehab     LOS: 4 days    John Giovanni PA-C Pager [phone] 01/14/2022

## 2022-01-14 NOTE — Progress Notes (Signed)
CARDIAC REHAB PHASE I   PRE:  Rate/Rhythm: 83 SR    BP: sitting 115/73    SaO2: 99 1L  MODE:  Ambulation: 370 ft   POST:  Rate/Rhythm: 92 SR    BP: sitting 125/78     SaO2: 93 RA  Pt up in recliner. Able to scoot hips forward and stand with standby assist. Ambulated with standby assist and RW. Very slow but steady. No major c/o, VSS. Encouraged x2 more walks and IS.  [phone]   Yves Dill BS, ACSM-CEP 01/14/2022 8:17 AM

## 2022-01-14 NOTE — Progress Notes (Addendum)
  Progress Note    01/14/2022 7:45 AM 4 Days Post-Op  Subjective:  Feeling a little better this morning   Vitals:   01/14/22 0603 01/14/22 0700  BP: 128/84 100/60  Pulse: 73 75  Resp: 20 14  Temp:    SpO2: 99% 98%   Physical Exam: Lungs:  non labored Incisions:  R groin without hematoma Extremities:  palpable ATA pulses; palpable radial pulses R > L Neurologic: A&O  CBC    Component Value Date/Time   WBC 29.7 (H) 01/14/2022 0451   RBC 2.89 (L) 01/14/2022 0451   HGB 9.0 (L) 01/14/2022 0451   HGB 14.6 08/26/2021 0943   HCT 28.2 (L) 01/14/2022 0451   PLT 107 (L) 01/14/2022 0451   PLT 146 (L) 08/26/2021 0943   MCV 97.6 01/14/2022 0451   MCH 31.1 01/14/2022 0451   MCHC 31.9 01/14/2022 0451   RDW 13.5 01/14/2022 0451   LYMPHSABS 17.3 (H) 09/13/2021 0712   MONOABS 0.5 09/13/2021 0712   EOSABS 0.0 09/13/2021 0712   BASOSABS 0.0 09/13/2021 0712    BMET    Component Value Date/Time   NA 133 (L) 01/14/2022 0451   K 4.3 01/14/2022 0451   CL 97 (L) 01/14/2022 0451   CO2 30 01/14/2022 0451   GLUCOSE 110 (H) 01/14/2022 0451   BUN 13 01/14/2022 0451   CREATININE 0.89 01/14/2022 0451   CREATININE 1.04 08/26/2021 0943   CREATININE 1.03 08/15/2021 0000   CALCIUM 8.0 (L) 01/14/2022 0451   GFRNONAA >60 01/14/2022 0451   GFRNONAA >60 08/26/2021 0943   GFRNONAA 84 03/30/2019 1603   GFRAA >60 08/22/2019 1011   GFRAA 97 03/30/2019 1603    INR    Component Value Date/Time   INR 1.6 (H) 01/10/2022 1544     Intake/Output Summary (Last 24 hours) at 01/14/2022 0745 Last data filed at 01/14/2022 0600 Gross per 24 hour  Intake --  Output 2300 ml  Net -2300 ml     Assessment/Plan:  77 y.o. male is s/p AORTIC ARCH DEBRANCHING WITH 16 X 8 X 10 MM HEMASHIELD [place] GRAFT, TRANSESOPHAGEAL ECHOCARDIOGRAM (TEE)    4 Days Post-Op   Distal pulses intact and moving ext well L arm venous duplex negative for DVT; patient will need to elevate the arm when in bed to help with edema UA  negative Ok to transfer to floor from vascular standpoint   Dagoberto Ligas, PA-C [facility] [phone] 01/14/2022 7:45 AM   I agree with the above.  I have seen and examined the patient.  Chest tubes out yesterday.  UE venous duplex negative for DVT.  UA with rare bacteria. -continue with mobilization and diuresis -transfer to [place]

## 2022-01-14 NOTE — Progress Notes (Signed)
EVENING ROUNDS NOTE :     [address]       [place] [address]             [phone]                 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 [place] GRAFT (N/A) TRANSESOPHAGEAL ECHOCARDIOGRAM (TEE) (N/A) THORACIC AORTIC ENDOVASCULAR STENT GRAFT (N/A)   Total Length of Stay:  LOS: 4 days  Events:   No events Resting comfortably    BP 110/74   Pulse 69   Temp 98.4 F (36.9 C) (Oral)   Resp (!) 24   Ht '5\' 10"'$  (1.778 m)   Wt 87.9 kg   SpO2 98%   BMI 27.81 kg/m          sodium chloride      I/O last 3 completed shifts: In: -  Out: 4100 [Urine:4000; Chest Tube:100]      Latest Ref Rng & Units 01/14/2022    4:51 AM 01/13/2022    4:19 AM 01/12/2022    4:28 AM  CBC  WBC 4.0 - 10.5 K/uL 29.7  29.6  30.9   Hemoglobin 13.0 - 17.0 g/dL 9.0  8.9  9.6   Hematocrit 39.0 - 52.0 % 28.2  28.1  28.5   Platelets 150 - 400 K/uL 107  91  78        Latest Ref Rng & Units 01/14/2022    4:51 AM 01/13/2022    4:19 AM 01/12/2022    4:28 AM  BMP  Glucose 70 - 99 mg/dL 110  113  121   BUN 8 - 23 mg/dL '13  12  13   '$ Creatinine 0.61 - 1.24 mg/dL 0.89  0.87  0.85   Sodium 135 - 145 mmol/L 133  134  132   Potassium 3.5 - 5.1 mmol/L 4.3  4.0  4.3   Chloride 98 - 111 mmol/L 97  97  100   CO2 22 - 32 mmol/L 30  32  25   Calcium 8.9 - 10.3 mg/dL 8.0  7.7  7.5     ABG    Component Value Date/Time   PHART 7.369 01/10/2022 2114   PCO2ART 43.2 01/10/2022 2114   PO2ART 95 01/10/2022 2114   HCO3 24.6 01/10/2022 2114   TCO2 26 01/10/2022 2114   ACIDBASEDEF 4.0 (H) 01/10/2022 2012   O2SAT 97 01/10/2022 2114       Melodie Bouillon, MD 01/14/2022 5:42 PM

## 2022-01-14 NOTE — Discharge Summary (Signed)
Physician Discharge Summary       [address]       [place] [address]             [phone]    Patient ID: Jason Abbott. MRN: [id number] DOB/AGE: Jun 24, 1945 77 y.o.  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[place]) 10/17/2021   Hyponatremia 09/13/2021   Hypoalbuminemia 09/13/2021   BPH (benign prostatic hyperplasia) 09/13/2021   Leg cramping 09/13/2021   Acute respiratory failure with hypoxia ([place]) 09/09/2021   Thrombocytopenia ([place]) 09/09/2021   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   Ingrown right greater toenail 08/12/2021   Chronic headaches 08/12/2021   Right hand pain 03/11/2021   Tinnitus 03/11/2021   Elevated TSH 09/12/2020   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   Community acquired bilateral lower lobe pneumonia 03/16/2019   CLL (chronic lymphocytic leukemia) ([place]) 02/23/2019   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/27/2019   Lumbar spinal stenosis 12/21/2018   Numbness and tingling in both hands 10/20/2018   Hyperlipidemia, mixed 06/01/2018   Seborrheic keratosis 01/12/2018   Combined forms of age-related cataract of left eye 03/23/2014   Regular astigmatism of left eye 03/23/2014   Dyspepsia 02/23/2014   Right shoulder pain 10/03/2013    Consults: vascular surgery  Procedure (s):   01/10/2022   Co-Surgeons:  [name], MD and [name], MD       Preoperative Diagnosis:  6 cm aortic arch aneurysm     Postoperative Diagnosis:  Same     Procedure:   Median Sternotomy 2.   Aortic arch de-branching with  aorto-right common carotid artery, aorto-left common carotid artery and aorto-left subclavian artery bypasses. 3.   Aortic arch aneurysm repair using a stent graft.   Anesthesia:  General Endotracheal    PCP is Silverio Decamp, MD Referring Provider is Serafina Mitchell, MD   Chief complaint: aortic arch aneurysm     History of Present Illness:   The patient is a 77 year old gentleman with a history of stable CLL being followed by Dr. 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  It was measured at 4.9 cm at that time.  He recently had a follow-up CT scan that showed an increase in the size of the aneurysm to 6 cm.  He has been followed by Dr. Trula Slade who recommended aortic arch reconstruction and TEVAR.  He was admitted to the hospital on 09/09/2021 with hypoxic respiratory failure and pneumonia.  He has recovered from this.  He recently underwent surgery for right carpal tunnel due to severe pain and inability to use his right hand.  He is making a good recovery from that. He then underwent right subclavian to common carotid artery transposition by Dr. Trula Slade on 12/18/2021 in preparation for aneurysm repair.  This 77 year old gentleman has an enlarging 6 cm saccular aneurysm arising from the inferior aortic arch with an aberrant right subclavian artery arising from the distal aortic arch.  I agree  that the best treatment for him is aortic arch de-branching with stent grafting across the aortic arch to exclude this aneurysm.  He has undergone right carotid-subclavian transposition preoperatively by Dr. Trula Slade in preparation for this.  I discussed the operative procedure with the patient and his wife including alternatives, benefits and risks; including but not limited to bleeding, blood transfusion, infection, stroke, stent  graft failure, paralysis or paresis of the lower extremities, organ dysfunction, and death.  Jason Abbott. understands and agrees to proceed.      Hospital Course:  Mr. Hinze was admitted for elective surgery on 01/10/22 and taken to the OR where the aortic arch debranching procedure was performed utilizing a 16 x 8 x 28m Hemashield Marcha Licklider graft. A thoracic aortic endovascular graft was also placed.  Following the procedure, he was taken to the ICU in stable condition.  Vital signs and hemodynamics remained stable while in the ICU..[name] Bleeding was controlled.  He was weaned from the ventilator and extubated by 8:30 PM on the day of surgery.  Blood pressure was supported with norepinephrine and Neo-Synephrine early postoperatively but this was weaned off overnight following surgery.  The monitoring lines were removed on the first postoperative day.  The patient was started on a course of routine mobilization..  He had a significant leukocytosis postoperatively felt to be related to his chronic lymphocytic leukemia.  A urinalysis was obtained which did not show evidence of UTI.  He has had no fevers.  He was also thrombocytopenic with platelet count 78,000 by postop day 2.  Recent value on 01/18/2022 is 139 K.  He did not have any significant bleeding.  Lab parameters were monitored closely.  He does have an expected acute blood loss anemia which has stabilized.  Additionally postoperatively he did develop atrial fibrillation which was somewhat difficult to control but subsequently did convert to sinus rhythm.  He is now on Eliquis in addition amiodarone and beta-blocker.  He has some occasional bradycardia in the 50s so this has limited the use of beta-blocker.  He also right bundle branch block.  Incisions are noted to be healing well without evidence of infection.  He is tolerating gradually increasing activities using routine cardiac rehabilitation phase 1 modalities.  Additionally, physical therapy helped  with overall rehabilitation which was slow but steady.  He does have an expected volume overload but is responding well to diuretics.  He is now euvolemic and diuretics were discontinued.  All incisions are healing well he has palpable pulses in all extremities with good strength.  Overall, at the time of discharge the patient is felt to be quite stable.        Latest Vital Signs: Blood pressure 110/70, pulse (!) 57, temperature 97.7 F (36.5 C), temperature source Oral, resp. rate 15, height '5\' 10"'$  (1.778 m), weight 81.5 kg, SpO2 96 %.  Physical Exam:  General appearance: alert, cooperative, and no distress Heart: regular rate and rhythm Lungs: clear to auscultation bilaterally Abdomen: Benign Extremities: No edema Wound: Incision healing well without evidence of infection   Discharge Condition:good  Recent laboratory studies:  Lab Results  Component Value Date   WBC 25.7 (H) 01/18/2022   HGB 9.5 (L) 01/18/2022   HCT 28.6 (L) 01/18/2022   MCV 95.0 01/18/2022   PLT 139 (L) 01/18/2022   Lab Results  Component Value Date   NA 133 (L) 01/19/2022   K 3.9 01/19/2022   CL 102 01/19/2022   CO2 23 01/19/2022  CREATININE 1.01 01/19/2022   GLUCOSE 150 (H) 01/19/2022      Diagnostic Studies: DG Chest 1 View  Result Date: 01/18/2022 CLINICAL DATA:  Follow-up exam. EXAM: CHEST  1 VIEW COMPARISON:  January 17, 2022 FINDINGS: A small left effusion with underlying opacity is stable. The cardiomediastinal silhouette is stable. No interval changes. IMPRESSION: Stable small left effusion with underlying opacity. No other interval changes. Electronically Signed   By: Dorise Bullion III M.D.   On: 01/18/2022 09:54   DG CHEST PORT 1 VIEW  Result Date: 01/17/2022 CLINICAL DATA:  Pneumonia. One week since cardiovascular surgery. Weakness and fatigue. EXAM: PORTABLE CHEST 1 VIEW COMPARISON:  Chest radiograph 01/14/2022. FINDINGS: Intact median sternotomy wires.  Stable thoracic aortic stent  graft. Slightly improved aeration in the lung bases. No pulmonary edema. Stable small left pleural effusion. Stable mild cardiomegaly and mediastinal contours. No pneumothorax. IMPRESSION: 1. Slightly improved aeration in the lung bases. 2. Stable small left pleural effusion. 3. Stable mild cardiomegaly. Electronically Signed   By: Emmit Alexanders M.D   On: 01/17/2022 09:01   DG CHEST PORT 1 VIEW  Result Date: 01/14/2022 CLINICAL DATA:  [id number] H/O aortic aneurysm repair [id number] EXAM: PORTABLE CHEST 1 VIEW COMPARISON:  Chest radiograph from one day prior. FINDINGS: Intact sternotomy wires. Stable thoracic aortic stent graft. Removal of right internal jugular central venous catheter and of left chest tubes. Stable cardiomediastinal silhouette with mild cardiomegaly. No pneumothorax. No right pleural effusion. Stable small left pleural effusion. No pulmonary edema. Hazy bibasilar lung opacities, left greater than right, similar. IMPRESSION: 1. No pneumothorax. 2. Stable small left pleural effusion. 3. Stable hazy bibasilar lung opacities, left greater than right, favor atelectasis. 4. Stable mild cardiomegaly without pulmonary edema. Electronically Signed   By: Ilona Sorrel M.D.   On: 01/14/2022 08:13   VAS [place] UPPER EXTREMITY VENOUS DUPLEX  Result Date: 01/13/2022 UPPER VENOUS STUDY  Patient Name:  Lynford Espinoza.  Date of Exam:   01/13/2022 Medical Rec #: [id number]            Accession #:    [id number] Date of Birth: May 04, 1945            Patient Gender: M Patient Age:   30 years Exam [facility]  [facility] Procedure:      VAS [place] UPPER EXTREMITY VENOUS DUPLEX Referring Phys: Gilford Raid --------------------------------------------------------------------------------  Indications: left arm swelling after aortic arch de-branching Comparison Study: no prior Performing Technologist: Archie Patten RVS  Examination Guidelines: A complete evaluation includes B-mode imaging, spectral Doppler, color Doppler,  and power Doppler as needed of all accessible portions of each vessel. Bilateral testing is considered an integral part of a complete examination. Limited examinations for reoccurring indications may be performed as noted.  Right Findings: +----------+------------+---------+-----------+----------+-------+ RIGHT     CompressiblePhasicitySpontaneousPropertiesSummary +----------+------------+---------+-----------+----------+-------+ Subclavian               Yes       Yes                      +----------+------------+---------+-----------+----------+-------+  Left Findings: +----------+------------+---------+-----------+----------+-------+ LEFT      CompressiblePhasicitySpontaneousPropertiesSummary +----------+------------+---------+-----------+----------+-------+ IJV           Full       Yes       Yes                      +----------+------------+---------+-----------+----------+-------+ Subclavian    Full  Yes       Yes                      +----------+------------+---------+-----------+----------+-------+ Axillary      Full       Yes       Yes                      +----------+------------+---------+-----------+----------+-------+ Brachial      Full       Yes       Yes                      +----------+------------+---------+-----------+----------+-------+ Radial        Full                                          +----------+------------+---------+-----------+----------+-------+ Ulnar         Full                                          +----------+------------+---------+-----------+----------+-------+ Cephalic      Full                                          +----------+------------+---------+-----------+----------+-------+ Basilic       Full                                          +----------+------------+---------+-----------+----------+-------+  Summary:  Right: No evidence of thrombosis in the subclavian.  Left: No evidence of  deep vein thrombosis in the upper extremity. No evidence of superficial vein thrombosis in the upper extremity.  *See table(s) above for measurements and observations.  Diagnosing physician: Monica Martinez MD Electronically signed by Monica Martinez MD on 01/13/2022 at 2:57:42 PM.    Final    DG CHEST PORT 1 VIEW  Result Date: 01/13/2022 CLINICAL DATA:  Aortic aneurysm repair. EXAM: PORTABLE CHEST 1 VIEW COMPARISON:  01/12/2022 and CT chest 09/09/2021. FINDINGS: Right IJ Swan-Ganz catheter tip terminates in the low right internal jugular vein or brachiocephalic vein junction, stable. Trachea is midline. Heart is enlarged. Endovascular stent graft spans the ascending, transverse and proximal descending thoracic aorta, as before. Calcified mediastinal lymph nodes. Mediastinal drain and left chest tube remain in place. Biapical pleural thickening, left greater than right. Tiny right and small left pleural effusions. No definite pneumothorax. Bibasilar atelectasis, left greater than right. IMPRESSION: 1. Bibasilar atelectasis, left greater than right. 2. Tiny right and small left pleural effusions. 3. No pneumothorax. Electronically Signed   By: Lorin Picket M.D.   On: 01/13/2022 08:25   DG Chest Port 1 View  Result Date: 01/12/2022 CLINICAL DATA:  Aortic aneurysm. EXAM: PORTABLE CHEST 1 VIEW COMPARISON:  June 12, 2022 FINDINGS: The endovascular stent in the aorta is stable. The cardiomediastinal silhouette is stable. Stable right central line. No pneumothorax. The right lung is clear. A small effusion with underlying opacity is again identified on the left. A left chest tube remains. IMPRESSION: 1. Stable support apparatus as above. 2. Stable small left effusion with  underlying opacity. 3. No other changes. Electronically Signed   By: Dorise Bullion III M.D.   On: 01/12/2022 11:55   DG Chest Port 1 View  Result Date: 01/11/2022 CLINICAL DATA:  Status post thoracic aortic aneurysm repair EXAM: PORTABLE  CHEST 1 VIEW COMPARISON:  Chest x-ray January 10, 2022 FINDINGS: The patient is status post aortic aneurysm repair. A left chest tube is stable. A right IJ is stable. The ET and NG tubes have been removed. No pneumothorax. There is a small left effusion with underlying atelectasis, improved. The cardiomediastinal silhouette is stable. No overt edema. IMPRESSION: 1. Support apparatus as above. 2. Small left effusion with underlying atelectasis, improved. 3. No other changes. Electronically Signed   By: Dorise Bullion III M.D.   On: 01/11/2022 10:03   ECHO INTRAOPERATIVE TEE  Result Date: 01/10/2022  *INTRAOPERATIVE TRANSESOPHAGEAL REPORT *  Patient Name:   ELBRIDGE MAGOWAN [facility]. Date of Exam: 01/10/2022 Medical Rec #:  [id number]           Height:       70.0 in Accession #:    [id number]          Weight:       194.7 lb Date of Birth:  07-01-1945           BSA:          2.06 m Patient Age:    77 years            BP:           108/82 mmHg Patient Gender: M                   HR:           53 bpm. Exam Location:  Anesthesiology Transesophogeal exam was perform intraoperatively during surgical procedure. Patient was closely monitored under general anesthesia during the entirety of examination. Indications:     Aortic Arch Aneurysm Sonographer:     Bernadene Person RDCS Performing Phys: [place] Diagnosing Phys: Annye Asa MD Complications: No known complications during this procedure. POST-OP IMPRESSIONS Limited post-stent graft exam: The proximal and descending aorta appear normal, with normal blood flow. The aortic arch is technically challenging to image with the stent graft present. _ Left Ventricle: The left ventricular function is unchanged from pre-graft images. Overall EF 61%, with normal wall motion. TEE guided volume resuscitation. _ Right Ventricle: The right ventricular function appears unchanged from pre-graft images. _ Aortic Valve: The aortic valve appears unchanged from pre-graft images. _  Mitral Valve: The mitral valve appears unchanged from pre-graft images. _ Tricuspid Valve: The tricuspid valve appears unchanged from pre-graft. PRE-OP FINDINGS  Left Ventricle: The left ventricle has low normal systolic function, with an ejection fraction of 50-55%, measured 50%. The cavity size was normal. There is no left ventricular hypertrophy. Left ventricular diastolic function was not evaluated. Right Ventricle: The right ventricle has normal systolic function. The cavity was normal. There is no increase in right ventricular wall thickness. Left Atrium: Left atrial size was normal in size. No left atrial/left atrial appendage thrombus was detected. Left atrial appendage velocity is normal at greater than 40 cm/s. Right Atrium: Right atrial size was normal in size. Prominent Eustachian valve. Catheter present in the right atrium. Interatrial Septum: No atrial level shunt detected by color flow Doppler. There is no evidence of a patent foramen ovale. Pericardium: There is no evidence of pericardial effusion. Mitral Valve: The mitral valve is normal  in structure. Mitral valve regurgitation is trivial by color flow Doppler. There is no evidence of mitral valve vegetation. Pulmonary venous flow is blunted (decreased). There is no evidence of mitral stenosis, peak gradient 1 mmHg. Tricuspid Valve: The tricuspid valve was normal in structure. Tricuspid valve regurgitation is trivial by color flow Doppler. No evidence of tricuspid stenosis is present. There is no evidence of tricuspid valve vegetation. Aortic Valve: The aortic valve is tricuspid. Aortic valve regurgitation was not visualized by color flow Doppler. There is no stenosis of the aortic valve, with peak gradient 5 mmHg, mean gradient 2 mmHg. There is no evidence of aortic valve vegetation. LVOT measures 2.4 cm Pulmonic Valve: The pulmonic valve was normal in structure, with normal leaflet mobility. No evidence of pulmonic stenosis. Pulmonic valve  regurgitation is trivial by color flow Doppler. Aorta: The aortic root and ascending aorta are normal in size and structure. LVOT measures 2.4 cm. Ascending aorta measures 4.08 cm. There is severe dilatation of the aortic arch. There is an aneurysm involving the aortic arch. Pulmonary Artery: The pulmonary artery is of normal size. Venous: The inferior vena cava is normal in size with greater than 50% respiratory variability, suggesting right atrial pressure of 3 mmHg. Shunts: There is no evidence of an atrial septal defect. +--------------+--------++ LEFT VENTRICLE         +--------------+--------++ PLAX 2D                +--------------+--------++ LVIDd:        4.30 cm  +--------------+--------++ LVIDs:        3.13 cm  +--------------+--------++ LV PW:        0.55 cm  +--------------+--------++ LVOT diam:    2.40 cm  +--------------+--------++ LV SV:        44 ml    +--------------+--------++ LV SV Index:  20.97    +--------------+--------++ LVOT Area:    4.52 cm +--------------+--------++                        +--------------+--------++ +-------------+-----------++ AORTIC VALVE             +-------------+-----------++ AV Vmax:     107.00 cm/s +-------------+-----------++ AV Vmean:    67.200 cm/s +-------------+-----------++ AV VTI:      0.209 m     +-------------+-----------++ AV Peak Grad:4.6 mmHg    +-------------+-----------++ AV Mean Grad:2.0 mmHg    +-------------+-----------++ +--------------+-----------++ MITRAL VALVE               +--------------+-------+ +--------------+-----------++  SHUNTS                MV Area (PHT):1.93 cm     +--------------+-------+ +--------------+-----------++  Systemic Diam:2.40 cm MV PHT:       114.26 msec  +--------------+-------+ +--------------+-----------++ MV Decel Time:394 msec    +--------------+-----------++ +--------------+----------++ MV E velocity:43.00 cm/s  +--------------+----------++ MV A velocity:35.10 cm/s +--------------+----------++ MV E/A ratio: 1.23       +--------------+----------++  Annye Asa MD Electronically signed by Annye Asa MD Signature Date/Time: 01/10/2022/5:47:52 PM    Final    DG Chest Port 1 View  Result Date: 01/10/2022 CLINICAL DATA:  Repair of thoracic aortic aneurysm EXAM: PORTABLE CHEST 1 VIEW COMPARISON:  Previous studies including the chest radiograph done on 01/07/2022 FINDINGS: There is interval endovascular stent repair of thoracic aorta. Tip of endotracheal tube is approximately 10 cm above the carina. Left chest tube is seen with its tip in the lateral left lower lung  field. There is another large caliber catheter with its tip in superior mediastinum or medial left apex. This may suggest second left chest tube or mediastinal drain. There are no signs of alveolar pulmonary edema. There is increased density in left lower lung field obscuring left hemidiaphragm. There is blunting of left lateral CP angle. Tip of enteric tube is seen in the stomach. Side-port in the enteric tube is slightly below the level of gastroesophageal junction. IMPRESSION: Status post endovascular stent repair of thoracic aortic aneurysm. Support devices as described. There new increased density in left lower lung field and blunting of left lateral CP angle suggesting atelectasis/pneumonia in left lower lung field and small left pleural effusion. Electronically Signed   By: Elmer Picker M.D.   On: 01/10/2022 17:12   HYBRID OR IMAGING ([facility] ONLY)  Result Date: 01/10/2022 There is no interpretation for this exam.  This order is for images obtained during a surgical procedure.  Please See "Surgeries" Tab for more information regarding the procedure.   DG Chest 2 View  Result Date: 01/07/2022 CLINICAL DATA:  Preop chest x-ray EXAM: CHEST - 2 VIEW COMPARISON:  October 17, 2021 FINDINGS: The heart size and mediastinal contours are are  stable. Prominent thoracic aorta identified along the inferior aspect of the aortic arch unchanged consistent with aneurysm. Both lungs are clear. The visualized skeletal structures are unremarkable. IMPRESSION: No active cardiopulmonary disease. Prominent thoracic aorta identified along the inferior aspect of the aortic arch unchanged consistent with aneurysm. Electronically Signed   By: Abelardo Diesel M.D.   On: 01/07/2022 13:57    Results for orders placed or performed during the hospital encounter of 01/10/22 (from the past 72 hour(s))  Basic metabolic panel     Status: Abnormal   Collection Time: 01/19/22 10:48 AM  Result Value Ref Range   Sodium 133 (L) 135 - 145 mmol/L   Potassium 3.9 3.5 - 5.1 mmol/L   Chloride 102 98 - 111 mmol/L   CO2 23 22 - 32 mmol/L   Glucose, Bld 150 (H) 70 - 99 mg/dL    Comment: Glucose reference range applies only to samples taken after fasting for at least 8 hours.   BUN 13 8 - 23 mg/dL   Creatinine, Ser 1.01 0.61 - 1.24 mg/dL   Calcium 8.2 (L) 8.9 - 10.3 mg/dL   GFR, Estimated >60 >60 mL/min    Comment: (NOTE) Calculated using the CKD-EPI Creatinine Equation (2021)    Anion gap 8 5 - 15    Comment: Performed at [facility] [address] [place], [place] [address]  Magnesium     Status: None   Collection Time: 01/19/22 10:48 AM  Result Value Ref Range   Magnesium 2.0 1.7 - 2.4 mg/dL    Comment: Performed at [facility] [address], [place], [place] [address]      Discharge Instructions     Discharge patient   Complete by: As directed    Discharge disposition: 01-Home or Self Care   Discharge patient date: 01/21/2022       Discharge Medications: Allergies as of 01/21/2022       Reactions   Levaquin [levofloxacin] Other (See Comments)   Due to medical hx        Medication List     STOP taking these medications    cefdinir 300 MG capsule Commonly known as: OMNICEF   ibuprofen 200 MG tablet Commonly known as:  ADVIL   oxyCODONE-acetaminophen 5-325  MG tablet Commonly known as: PERCOCET/ROXICET   POTASSIUM CITRATE PO       TAKE these medications    amiodarone 200 MG tablet Commonly known as: PACERONE Take 1 tablet (200 mg total) by mouth 2 (two) times daily.   apixaban 5 MG Tabs tablet Commonly known as: ELIQUIS Take 1 tablet (5 mg total) by mouth 2 (two) times daily.   aspirin EC 81 MG tablet Take 1 tablet (81 mg total) by mouth daily at 6 (six) AM. Swallow whole.   AZO CRANBERRY PO Take 1 each by mouth in the morning.   Breztri Aerosphere 160-9-4.8 MCG/ACT Aero Generic drug: Budeson-Glycopyrrol-Formoterol Inhale 2 puffs into the lungs daily.   metoprolol tartrate 25 MG tablet Commonly known as: LOPRESSOR Take 0.5 tablets (12.5 mg total) by mouth 2 (two) times daily.   MULTIVITAMIN GUMMIES ADULTS PO Take 2 tablets by mouth in the morning.   oxyCODONE 5 MG immediate release tablet Commonly known as: Oxy IR/ROXICODONE Take 1 tablet (5 mg total) by mouth every 6 (six) hours as needed for up to 7 days for severe pain.   tamsulosin 0.4 MG Caps capsule Commonly known as: FLOMAX Take 0.4 mg by mouth in the morning.   VITAMIN B 12 PO Take 1 tablet by mouth in the morning.        Follow Up Appointments:  Signed: Gaspar Bidding 01/21/2022, 1:52 PM

## 2022-01-14 NOTE — Discharge Instructions (Addendum)
Discharge Instructions:  1. You may shower, please wash incisions daily with soap and water and keep dry.  If you wish to cover wounds with dressing you may do so but please keep clean and change daily.  No tub baths or swimming until incisions have completely healed.  If your incisions become red or develop any drainage please call our office at [phone]  2. No Driving until cleared by Dr Vivi Martens office and you are no longer using narcotic pain medications  3. Monitor your weight daily.. Please use the same scale and weigh at same time... If you gain 5-10 lbs in 48 hours with associated lower extremity swelling, please contact our office at [phone]  4. Fever of 101.5 for at least 24 hours with no source, please contact our office at [phone]  5. Activity- up as tolerated, please walk at least 3 times per day.  Avoid strenuous activity, no lifting, pushing, or pulling with your arms over 8-10 lbs for a minimum of 6 weeks  6. If any questions or concerns arise, please do not hesitate to contact our office at [phone]  Information on my medicine - ELIQUIS (apixaban)  Why was Eliquis prescribed for you? Eliquis was prescribed for you to reduce the risk of a blood clot forming that can cause a stroke if you have a medical condition called atrial fibrillation (a type of irregular heartbeat).  What do You need to know about Eliquis ? Take your Eliquis TWICE DAILY - one tablet in the morning and one tablet in the evening with or without food. If you have difficulty swallowing the tablet whole please discuss with your pharmacist how to take the medication safely.  Take Eliquis exactly as prescribed by your doctor and DO NOT stop taking Eliquis without talking to the doctor who prescribed the medication.  Stopping may increase your risk of developing a stroke.  Refill your prescription before you run out.  After discharge, you should have regular check-up appointments with  your healthcare provider that is prescribing your Eliquis.  In the future your dose may need to be changed if your kidney function or weight changes by a significant amount or as you get older.  What do you do if you miss a dose? If you miss a dose, take it as soon as you remember on the same day and resume taking twice daily.  Do not take more than one dose of ELIQUIS at the same time to make up a missed dose.  Important Safety Information A possible side effect of Eliquis is bleeding. You should call your healthcare provider right away if you experience any of the following: Bleeding from an injury or your nose that does not stop. Unusual colored urine (red or dark brown) or unusual colored stools (red or black). Unusual bruising for unknown reasons. A serious fall or if you hit your head (even if there is no bleeding).  Some medicines may interact with Eliquis and might increase your risk of bleeding or clotting while on Eliquis. To help avoid this, consult your healthcare provider or pharmacist prior to using any new prescription or non-prescription medications, including herbals, vitamins, non-steroidal anti-inflammatory drugs (NSAIDs) and supplements.  This website has more information on Eliquis (apixaban): http://www.eliquis.com/eliquis/home

## 2022-01-15 ENCOUNTER — Encounter (HOSPITAL_COMMUNITY): Payer: Self-pay | Admitting: Surgery

## 2022-01-15 LAB — BASIC METABOLIC PANEL
Anion gap: 6 (ref 5–15)
BUN: 13 mg/dL (ref 8–23)
CO2: 27 mmol/L (ref 22–32)
Calcium: 8.1 mg/dL — ABNORMAL LOW (ref 8.9–10.3)
Chloride: 101 mmol/L (ref 98–111)
Creatinine, Ser: 0.89 mg/dL (ref 0.61–1.24)
GFR, Estimated: >60 mL/min (ref >60)
Glucose, Bld: 110 mg/dL — ABNORMAL HIGH (ref 70–99)
Potassium: 3.9 mmol/L (ref 3.5–5.1)
Sodium: 134 mmol/L — ABNORMAL LOW (ref 135–145)

## 2022-01-15 LAB — CBC
HCT: 28.6 % — ABNORMAL LOW (ref 39.0–52.0)
Hemoglobin: 9.2 g/dL — ABNORMAL LOW (ref 13.0–17.0)
MCH: 31.4 pg (ref 26.0–34.0)
MCHC: 32.2 g/dL (ref 30.0–36.0)
MCV: 97.6 fL (ref 80.0–100.0)
Platelets: 125 10*3/uL — ABNORMAL LOW (ref 150–400)
RBC: 2.93 MIL/uL — ABNORMAL LOW (ref 4.22–5.81)
RDW: 13.5 % (ref 11.5–15.5)
WBC: 31.1 10*3/uL — ABNORMAL HIGH (ref 4.0–10.5)
nRBC: 0 % (ref 0.0–0.2)

## 2022-01-15 MED ORDER — AMIODARONE LOAD VIA INFUSION
150.0000 mg | Freq: Once | INTRAVENOUS | Status: AC
  Administered 2022-01-15: 150 mg via INTRAVENOUS
  Filled 2022-01-15: qty 83.34

## 2022-01-15 MED ORDER — AMIODARONE HCL IN DEXTROSE 360-4.14 MG/200ML-% IV SOLN
60.0000 mg/h | INTRAVENOUS | Status: AC
  Administered 2022-01-16: 60 mg/h via INTRAVENOUS
  Filled 2022-01-15 (×2): qty 200

## 2022-01-15 MED ORDER — LACTULOSE 10 GM/15ML PO SOLN: 20.0000 g | Freq: Every day | ORAL | Status: DC | PRN

## 2022-01-15 MED ORDER — AMIODARONE HCL IN DEXTROSE 360-4.14 MG/200ML-% IV SOLN: 30.0000 mg/h | INTRAVENOUS | Status: DC

## 2022-01-15 MED FILL — Tranexamic Acid IV Soln 1000 MG/10ML (100 MG/ML): INTRAVENOUS | Qty: 3000 | Status: AC

## 2022-01-15 MED FILL — Potassium Chloride Inj 2 mEq/ML: INTRAVENOUS | Qty: 40 | Status: AC

## 2022-01-15 MED FILL — Lidocaine HCl Local Preservative Free (PF) Inj 2%: INTRAMUSCULAR | Qty: 14 | Status: AC

## 2022-01-15 MED FILL — Heparin Sodium (Porcine) Inj 1000 Unit/ML: Qty: 1000 | Status: AC

## 2022-01-15 NOTE — Progress Notes (Signed)
CARDIAC REHAB PHASE I   PRE:  Rate/Rhythm: 72 SR    BP: sitting     SaO2: 93 RA  MODE:  Ambulation: 470 ft   POST:  Rate/Rhythm: 86 SR    BP: sitting      SaO2: 96 RA  Pt getting up with RN. Ambulated with RW, standby assist. Quicker pace today, steady. No major c/o. To BR after walk, passing gas. Encouraged IS and another walk. Has RW at home. [phone]   Yves Dill BS, ACSM-CEP 01/15/2022 2:10 PM

## 2022-01-15 NOTE — Progress Notes (Addendum)
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  Small amount of sputum production yesterday.  Has not had bowel movement since surgery but has not uncomfortable in that regard  Objective: Vital signs in last 24 hours: Temp:  [98.2 F (36.8 C)-98.8 F (37.1 C)] 98.3 F (36.8 C) (01/10 0400) Pulse Rate:  [64-89] 75 (01/10 0700) Cardiac Rhythm: Normal sinus rhythm (01/10 0000) Resp:  [11-26] 14 (01/10 0700) BP: (94-135)/(62-84) 98/71 (01/10 0700) SpO2:  [92 %-98 %] 98 % (01/10 0700)  Hemodynamic parameters for last 24 hours:    Intake/Output from previous day: 01/09 0701 - 01/10 0700 In: -  Out: 2250 [Urine:2250] Intake/Output this shift: No intake/output data recorded.  General appearance: alert, cooperative, and no distress Heart: regular rate and rhythm Lungs: clear to auscultation bilaterally Abdomen: Soft, nontender, minor distention Extremities: +ankle edema, palpable PT bilateral Wound: Incisions healing well without evidence of infection  Lab Results: Recent Labs    01/14/22 0451 01/15/22 0252  WBC 29.7* 31.1*  HGB 9.0* 9.2*  HCT 28.2* 28.6*  PLT 107* 125*   BMET:  Recent Labs    01/14/22 0451 01/15/22 0252  NA 133* 134*  K 4.3 3.9  CL 97* 101  CO2 30 27  GLUCOSE 110* 110*  BUN 13 13  CREATININE 0.89 0.89  CALCIUM 8.0* 8.1*    PT/INR: No results for input(s): "LABPROT", "INR" in the last 72 hours. ABG    Component Value Date/Time   PHART 7.369 01/10/2022 2114   HCO3 24.6 01/10/2022 2114   TCO2 26 01/10/2022 2114   ACIDBASEDEF 4.0 (H) 01/10/2022 2012   O2SAT 97 01/10/2022 2114   CBG (last 3)  Recent Labs     01/12/22 1550 01/12/22 2136 01/13/22 0615  GLUCAP 119* 117* 96    Meds Scheduled Meds:  aspirin EC  81 mg Oral Daily   Chlorhexidine Gluconate Cloth  6 each Topical Daily   metoprolol tartrate  12.5 mg Oral BID   mometasone-formoterol  2 puff Inhalation BID   And   umeclidinium bromide  1 puff Inhalation Daily   pantoprazole  40 mg Oral Daily   sodium chloride flush  3 mL Intravenous Q12H   tamsulosin  0.4 mg Oral q AM   Continuous Infusions:  sodium chloride     PRN Meds:.sodium chloride, ondansetron **OR** ondansetron (ZOFRAN) IV, mouth rinse, oxyCODONE, senna-docusate, sodium chloride flush, traMADol  Xrays DG CHEST PORT 1 VIEW  Result Date: 01/14/2022 CLINICAL DATA:  [id number] H/O aortic aneurysm repair [id number] EXAM: PORTABLE CHEST 1 VIEW COMPARISON:  Chest radiograph from one day prior. FINDINGS: Intact sternotomy wires. Stable thoracic aortic stent graft. Removal of right internal jugular central venous catheter and of left chest tubes. Stable cardiomediastinal silhouette with mild cardiomegaly. No pneumothorax. No right pleural effusion. Stable small left pleural effusion. No pulmonary edema. Hazy bibasilar lung opacities, left greater than right, similar. IMPRESSION: 1. No pneumothorax. 2. Stable small left pleural effusion. 3. Stable hazy bibasilar lung opacities, left greater than right, favor atelectasis. 4. Stable mild cardiomegaly without pulmonary edema. Electronically Signed   By: Ilona Sorrel  M.D.   On: 01/14/2022 08:13   VAS [place] UPPER EXTREMITY VENOUS DUPLEX  Result Date: 01/13/2022 UPPER VENOUS STUDY  Patient Name:  Jason Abbott.  Date of Exam:   01/13/2022 Medical Rec #: [id number]            Accession #:    [id number] Date of Birth: Mar 02, 1945            Patient Gender: M Patient Age:   77 years Exam Location:  [facility] Procedure:      VAS [place] UPPER EXTREMITY VENOUS DUPLEX Referring Phys: Gilford Raid  --------------------------------------------------------------------------------  Indications: left arm swelling after aortic arch de-branching Comparison Study: no prior Performing Technologist: Archie Patten RVS  Examination Guidelines: A complete evaluation includes B-mode imaging, spectral Doppler, color Doppler, and power Doppler as needed of all accessible portions of each vessel. Bilateral testing is considered an integral part of a complete examination. Limited examinations for reoccurring indications may be performed as noted.  Right Findings: +----------+------------+---------+-----------+----------+-------+ RIGHT     CompressiblePhasicitySpontaneousPropertiesSummary +----------+------------+---------+-----------+----------+-------+ Subclavian               Yes       Yes                      +----------+------------+---------+-----------+----------+-------+  Left Findings: +----------+------------+---------+-----------+----------+-------+ LEFT      CompressiblePhasicitySpontaneousPropertiesSummary +----------+------------+---------+-----------+----------+-------+ IJV           Full       Yes       Yes                      +----------+------------+---------+-----------+----------+-------+ Subclavian    Full       Yes       Yes                      +----------+------------+---------+-----------+----------+-------+ Axillary      Full       Yes       Yes                      +----------+------------+---------+-----------+----------+-------+ Brachial      Full       Yes       Yes                      +----------+------------+---------+-----------+----------+-------+ Radial        Full                                          +----------+------------+---------+-----------+----------+-------+ Ulnar         Full                                          +----------+------------+---------+-----------+----------+-------+ Cephalic      Full                                           +----------+------------+---------+-----------+----------+-------+ Basilic       Full                                          +----------+------------+---------+-----------+----------+-------+  Summary:  Right: No evidence of thrombosis in the subclavian.  Left: No evidence of deep vein thrombosis in the upper extremity. No evidence of superficial vein thrombosis in the upper extremity.  *See table(s) above for measurements and observations.  Diagnosing physician: Monica Martinez MD Electronically signed by Monica Martinez MD on 01/13/2022 at 2:57:42 PM.    Final     Assessment/Plan: S/P Procedure(s) (LRB): AORTIC ARCH DEBRANCHING WITH 16 X 8 X 10 MM HEMASHIELD GOLD GRAFT (N/A) TRANSESOPHAGEAL ECHOCARDIOGRAM (TEE) (N/A) THORACIC AORTIC ENDOVASCULAR STENT GRAFT (N/A)  POD #5  1 afebrile, stable vital signs, does have short runs of wide-complex tachycardia, mostly sinus rhythm-metoprolol was increased briefly yesterday but decreased again secondary to lower blood pressures. 2 O2 sats are good on 1-2 L nasal cannula 3 excellent urine output, not weighed today yet-continue gentle diuresis 4 hyponatremia has almost completely normalized, renal function remains normal. 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  Continue rehab and pulmonary hygiene  LOS: 5 days    John Giovanni PA-C Pager [phone] 01/15/2022   Chart reviewed, patient examined, agree with above. He looks good overall but still fatigued, uncomfortable sleeping in bed. He needs to continue nutrition, ambulation, IS. Waiting on 4E bed.

## 2022-01-15 NOTE — Progress Notes (Addendum)
  Progress Note    01/15/2022 9:05 AM 5 Days Post-Op  Subjective:  not sleeping well but otherwise doing ok   Vitals:   01/15/22 0600 01/15/22 0700  BP: 135/82 98/71  Pulse: 73 75  Resp: (!) 21 14  Temp:  97.9 F (36.6 C)  SpO2: 96% 98%   Physical Exam: Lungs:  non labored on 1L O2 by [place]rum Incisions:  R groin cath site with some ecchymosis but no palpable hematoma Extremities:  symmetrical ATA and radial pulses Abdomen:  soft, NT, ND Neurologic: A&O  CBC    Component Value Date/Time   WBC 31.1 (H) 01/15/2022 0252   RBC 2.93 (L) 01/15/2022 0252   HGB 9.2 (L) 01/15/2022 0252   HGB 14.6 08/26/2021 0943   HCT 28.6 (L) 01/15/2022 0252   PLT 125 (L) 01/15/2022 0252   PLT 146 (L) 08/26/2021 0943   MCV 97.6 01/15/2022 0252   MCH 31.4 01/15/2022 0252   MCHC 32.2 01/15/2022 0252   RDW 13.5 01/15/2022 0252   LYMPHSABS 17.3 (H) 09/13/2021 0712   MONOABS 0.5 09/13/2021 0712   EOSABS 0.0 09/13/2021 0712   BASOSABS 0.0 09/13/2021 0712    BMET    Component Value Date/Time   NA 134 (L) 01/15/2022 0252   K 3.9 01/15/2022 0252   CL 101 01/15/2022 0252   CO2 27 01/15/2022 0252   GLUCOSE 110 (H) 01/15/2022 0252   BUN 13 01/15/2022 0252   CREATININE 0.89 01/15/2022 0252   CREATININE 1.04 08/26/2021 0943   CREATININE 1.03 08/15/2021 0000   CALCIUM 8.1 (L) 01/15/2022 0252   GFRNONAA >60 01/15/2022 0252   GFRNONAA >60 08/26/2021 0943   GFRNONAA 84 03/30/2019 1603   GFRAA >60 08/22/2019 1011   GFRAA 97 03/30/2019 1603    INR    Component Value Date/Time   INR 1.6 (H) 01/10/2022 1544     Intake/Output Summary (Last 24 hours) at 01/15/2022 0905 Last data filed at 01/15/2022 0600 Gross per 24 hour  Intake --  Output 2250 ml  Net -2250 ml     Assessment/Plan:  77 y.o. male is s/p  AORTIC ARCH DEBRANCHING WITH 16 X 8 X 10 MM HEMASHIELD GOLD GRAFT (N/A) TRANSESOPHAGEAL ECHOCARDIOGRAM (TEE) (N/A) THORACIC AORTIC ENDOVASCULAR STENT GRAFT  5 Days Post-Op   Moving all  extremities well; distal pulses intact R groin cath site with mild ecchymosis but no palpable hematoma Continue OOB, ambulate 4E when bed available   Dagoberto Ligas, PA-C [facility] [phone] 01/15/2022 9:05 AM  I agree with the above.  I have seen and evaluated the patient.  He is recovering appropriately.  We will work on mobilization.  He will go to 4 E. when a bed is available  [name]

## 2022-01-15 NOTE — Progress Notes (Signed)
Patient arrived on 4E from [place].  Patient sitting up in chair, having lunch.  No complaints.  Vital signs stable.    01/15/22 1151  Vitals  Temp 97.6 F (36.4 C)  Temp Source Oral  BP 131/62  MAP (mmHg) 82  BP Location Left Arm  BP Method Automatic  Patient Position (if appropriate) Sitting  Pulse Rate 79  Pulse Rate Source Monitor  ECG Heart Rate 79  Resp 20  Level of Consciousness  Level of Consciousness Alert  MEWS COLOR  MEWS Score Color Green  Oxygen Therapy  SpO2 98 %  O2 Device Room Air  Pain Assessment  Pain Scale 0-10  Pain Score 0  MEWS Score  MEWS Temp 0  MEWS Systolic 0  MEWS Pulse 0  MEWS RR 0  MEWS LOC 0  MEWS Score 0

## 2022-01-16 MED ORDER — AMIODARONE HCL 200 MG PO TABS
400.0000 mg | ORAL_TABLET | Freq: Two times a day (BID) | ORAL | Status: DC
  Administered 2022-01-16 – 2022-01-21 (×11): 400 mg via ORAL
  Filled 2022-01-16 (×11): qty 2

## 2022-01-16 NOTE — Progress Notes (Signed)
CARDIAC REHAB PHASE I   PRE:  Rate/Rhythm: 72 NSR  BP:  Sitting: 123/84      SaO2: 100 RA  MODE:  Ambulation: To chair    AD:  RW   Pt unable to make it to the hall d/t 5/10 dizziness. Pt amb to chair and educated on heart healthy diet and Move in the Tube sheet. Pt instructed on exercises in chair.   Jason Abbott  9:37 AM 01/16/2022    Service time is from 0854 to [phone].

## 2022-01-16 NOTE — Progress Notes (Signed)
6 Days Post-Op Procedure(s) (LRB): AORTIC ARCH DEBRANCHING WITH 16 X 8 X 10 MM HEMASHIELD GOLD GRAFT (N/A) TRANSESOPHAGEAL ECHOCARDIOGRAM (TEE) (N/A) THORACIC AORTIC ENDOVASCULAR STENT GRAFT (N/A) Subjective: Feels ok. Developed atrial fib with RVR overnight and started on IV amio. Converted about 3 am.   Objective: Vital signs in last 24 hours: Temp:  [97.4 F (36.3 C)-98.1 F (36.7 C)] 98 F (36.7 C) (01/11 0400) Pulse Rate:  [71-134] 81 (01/11 0400) Cardiac Rhythm: Normal sinus rhythm;Bundle branch block (01/11 0310) Resp:  [12-25] 25 (01/11 0400) BP: (98-131)/(62-87) 104/71 (01/11 0400) SpO2:  [92 %-100 %] 100 % (01/11 0400)  Hemodynamic parameters for last 24 hours:    Intake/Output from previous day: 01/10 0701 - 01/11 0700 In: 3 [I.V.:3] Out: -  Intake/Output this shift: Total I/O In: 3 [I.V.:3] Out: -   General appearance: alert and cooperative Neurologic: intact Heart: regular rate and rhythm, S1, S2 normal, no murmur Lungs: clear to auscultation bilaterally Extremities: no edema Wound: incision healing well  Lab Results: Recent Labs    01/14/22 0451 01/15/22 0252  WBC 29.7* 31.1*  HGB 9.0* 9.2*  HCT 28.2* 28.6*  PLT 107* 125*   BMET:  Recent Labs    01/14/22 0451 01/15/22 0252  NA 133* 134*  K 4.3 3.9  CL 97* 101  CO2 30 27  GLUCOSE 110* 110*  BUN 13 13  CREATININE 0.89 0.89  CALCIUM 8.0* 8.1*    PT/INR: No results for input(s): "LABPROT", "INR" in the last 72 hours. ABG    Component Value Date/Time   PHART 7.369 01/10/2022 2114   HCO3 24.6 01/10/2022 2114   TCO2 26 01/10/2022 2114   ACIDBASEDEF 4.0 (H) 01/10/2022 2012   O2SAT 97 01/10/2022 2114   CBG (last 3)  No results for input(s): "GLUCAP" in the last 72 hours.  Assessment/Plan: S/P Procedure(s) (LRB): AORTIC ARCH DEBRANCHING WITH 16 X 8 X 10 MM HEMASHIELD GOLD GRAFT (N/A) TRANSESOPHAGEAL ECHOCARDIOGRAM (TEE) (N/A) THORACIC AORTIC ENDOVASCULAR STENT GRAFT (N/A)  POD  6  Hemodynamically stable.  Postop atrial fib converted with IV amio. He only has peripheral IV so will switch to po amio this am. If any further bursts of AF then give him an additional bolus. Continue low dose Lopressor. Will check ECG in am for QTc.  Continue IS, ambulation.  Home when rhythm stable and a little stronger.   LOS: 6 days    [name] 01/16/2022

## 2022-01-16 NOTE — Progress Notes (Signed)
Mobility Specialist Progress Note:   01/16/22 1446  Mobility  Activity Ambulated with assistance in hallway  Level of Assistance Modified independent, requires aide device or extra time  Assistive Device Front wheel walker  Distance Ambulated (ft) 450 ft  Activity Response Tolerated well  $Mobility charge 1 Mobility   Pt EOB asking to walk. No complaints of pain. Left EOB with call bell in reach and all needs met.   Gareth Eagle Cameshia Cressman Mobility Specialist Please contact via [employer] or  Rehab Office at [phone]

## 2022-01-16 NOTE — Progress Notes (Signed)
Mobility Specialist Progress Note:   01/16/22 1105  Mobility  Activity Transferred from chair to bed  Level of Assistance Independent after set-up  Assistive Device None  Distance Ambulated (ft) 2 ft  Activity Response Tolerated well  $Mobility charge 1 Mobility   Pt in chair. Declined hallway ambulation d/t being up "all night". Left in bed with call bell in reach and all needs met.   Gareth Eagle Joshoa Shawler Mobility Specialist Please contact via [employer] or  Rehab Office at [phone]

## 2022-01-16 NOTE — Progress Notes (Addendum)
  Progress Note    01/16/2022 6:50 AM 6 Days Post-Op  Subjective:  says he had a BM last night and then went into afib and had trouble sleeping after that.   Afebrile   Vitals:   01/16/22 0300 01/16/22 0400  BP: 118/68 104/71  Pulse: (!) 124 81  Resp: (!) 22 (!) 25  Temp:  98 F (36.7 C)  SpO2: 94% 100%    Physical Exam: General:  no distress Cardiac:  regular Lungs:  non labored Incisions:  right groin soft with some ecchymosis Extremities:  bilateral feet are warm and well perfused; bilateral radial arteries are palpable; motor and sensory in tact distal extremities   CBC    Component Value Date/Time   WBC 31.1 (H) 01/15/2022 0252   RBC 2.93 (L) 01/15/2022 0252   HGB 9.2 (L) 01/15/2022 0252   HGB 14.6 08/26/2021 0943   HCT 28.6 (L) 01/15/2022 0252   PLT 125 (L) 01/15/2022 0252   PLT 146 (L) 08/26/2021 0943   MCV 97.6 01/15/2022 0252   MCH 31.4 01/15/2022 0252   MCHC 32.2 01/15/2022 0252   RDW 13.5 01/15/2022 0252   LYMPHSABS 17.3 (H) 09/13/2021 0712   MONOABS 0.5 09/13/2021 0712   EOSABS 0.0 09/13/2021 0712   BASOSABS 0.0 09/13/2021 0712    BMET    Component Value Date/Time   NA 134 (L) 01/15/2022 0252   K 3.9 01/15/2022 0252   CL 101 01/15/2022 0252   CO2 27 01/15/2022 0252   GLUCOSE 110 (H) 01/15/2022 0252   BUN 13 01/15/2022 0252   CREATININE 0.89 01/15/2022 0252   CREATININE 1.04 08/26/2021 0943   CREATININE 1.03 08/15/2021 0000   CALCIUM 8.1 (L) 01/15/2022 0252   GFRNONAA >60 01/15/2022 0252   GFRNONAA >60 08/26/2021 0943   GFRNONAA 84 03/30/2019 1603   GFRAA >60 08/22/2019 1011   GFRAA 97 03/30/2019 1603    INR    Component Value Date/Time   INR 1.6 (H) 01/10/2022 1544     Intake/Output Summary (Last 24 hours) at 01/16/2022 0650 Last data filed at 01/15/2022 2150 Gross per 24 hour  Intake 3 ml  Output --  Net 3 ml      Assessment/Plan:  77 y.o. 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   Leontine Locket, PA-C [facility] [phone] 01/16/2022 6:50 AM  I agree with the above.  I have seen and evaluated the patient.  He feels very weak today.  Continue with mobilization  Wells Tad Fancher

## 2022-01-17 ENCOUNTER — Inpatient Hospital Stay (HOSPITAL_COMMUNITY): Payer: Medicare Other

## 2022-01-17 LAB — BASIC METABOLIC PANEL
Anion gap: 8 (ref 5–15)
BUN: 12 mg/dL (ref 8–23)
CO2: 24 mmol/L (ref 22–32)
Calcium: 8.1 mg/dL — ABNORMAL LOW (ref 8.9–10.3)
Chloride: 103 mmol/L (ref 98–111)
Creatinine, Ser: 0.94 mg/dL (ref 0.61–1.24)
GFR, Estimated: >60 mL/min (ref >60)
Glucose, Bld: 112 mg/dL — ABNORMAL HIGH (ref 70–99)
Potassium: 4 mmol/L (ref 3.5–5.1)
Sodium: 135 mmol/L (ref 135–145)

## 2022-01-17 LAB — CBC
HCT: 29 % — ABNORMAL LOW (ref 39.0–52.0)
Hemoglobin: 9.6 g/dL — ABNORMAL LOW (ref 13.0–17.0)
MCH: 31.6 pg (ref 26.0–34.0)
MCHC: 33.1 g/dL (ref 30.0–36.0)
MCV: 95.4 fL (ref 80.0–100.0)
Platelets: 133 10*3/uL — ABNORMAL LOW (ref 150–400)
RBC: 3.04 MIL/uL — ABNORMAL LOW (ref 4.22–5.81)
RDW: 13.7 % (ref 11.5–15.5)
WBC: 26.3 10*3/uL — ABNORMAL HIGH (ref 4.0–10.5)
nRBC: 0.1 % (ref 0.0–0.2)

## 2022-01-17 MED ORDER — LACTULOSE 10 GM/15ML PO SOLN
20.0000 g | Freq: Once | ORAL | Status: AC
  Administered 2022-01-17: 20 g via ORAL
  Filled 2022-01-17: qty 30

## 2022-01-17 NOTE — Progress Notes (Signed)
   01/17/22 1100  Mobility  Activity Ambulated with assistance in hallway  Level of Assistance Modified independent, requires aide device or extra time  Assistive Device Front wheel walker  Distance Ambulated (ft) 450 ft  Activity Response Tolerated well  Mobility Referral Yes  $Mobility charge 1 Mobility   Mobility Specialist Progress Note  Received pt in bed having no complaints and agreeable to mobility. Pt was asymptomatic throughout ambulation and returned to room w/o fault. Left in bed w/ call bell in reach and all needs met.  Lucious Groves Mobility Specialist  Please contact via [employer] or Rehab office at [phone]

## 2022-01-17 NOTE — Progress Notes (Addendum)
  Progress Note    01/17/2022 7:55 AM 7 Days Post-Op  Subjective:  feeling okay. Still feels weak    Vitals:   01/16/22 2354 01/17/22 0528  BP: 130/61 124/73  Pulse: 64 64  Resp: (!) 21 18  Temp: 98.3 F (36.8 C) 98.1 F (36.7 C)  SpO2: 91% 95%    Physical Exam: General:  NAD Cardiac:  NSR Lungs:  nonlabored Incisions:  R groin still soft with resolving ecchymosis Extremities:  bilateral feet warm and well perfused. Palpable radial pulses bilaterally. Intact motor and sensation in BLE and BUE  CBC    Component Value Date/Time   WBC 31.1 (H) 01/15/2022 0252   RBC 2.93 (L) 01/15/2022 0252   HGB 9.2 (L) 01/15/2022 0252   HGB 14.6 08/26/2021 0943   HCT 28.6 (L) 01/15/2022 0252   PLT 125 (L) 01/15/2022 0252   PLT 146 (L) 08/26/2021 0943   MCV 97.6 01/15/2022 0252   MCH 31.4 01/15/2022 0252   MCHC 32.2 01/15/2022 0252   RDW 13.5 01/15/2022 0252   LYMPHSABS 17.3 (H) 09/13/2021 0712   MONOABS 0.5 09/13/2021 0712   EOSABS 0.0 09/13/2021 0712   BASOSABS 0.0 09/13/2021 0712    BMET    Component Value Date/Time   NA 134 (L) 01/15/2022 0252   K 3.9 01/15/2022 0252   CL 101 01/15/2022 0252   CO2 27 01/15/2022 0252   GLUCOSE 110 (H) 01/15/2022 0252   BUN 13 01/15/2022 0252   CREATININE 0.89 01/15/2022 0252   CREATININE 1.04 08/26/2021 0943   CREATININE 1.03 08/15/2021 0000   CALCIUM 8.1 (L) 01/15/2022 0252   GFRNONAA >60 01/15/2022 0252   GFRNONAA >60 08/26/2021 0943   GFRNONAA 84 03/30/2019 1603   GFRAA >60 08/22/2019 1011   GFRAA 97 03/30/2019 1603    INR    Component Value Date/Time   INR 1.6 (H) 01/10/2022 1544     Intake/Output Summary (Last 24 hours) at 01/17/2022 0755 Last data filed at 01/17/2022 0602 Gross per 24 hour  Intake 150 ml  Output 1250 ml  Net -1100 ml      Assessment/Plan:  77 y.o. male is 7 days post op,s/p: aortic debranching with TEVAR   -R groin still soft with resolving ecchymosis -Bilateral upper and lower extremities  well perfused with intact motor and sensation -Still feeling very weak. Will continue with mobilization -Incisions dry and intact   Jason Serene, PA-C [facility] [phone] 01/17/2022 7:55 AM   Remains very weak Palpable extremity pulses Repeat labs and CXR today  Jason Abbott

## 2022-01-17 NOTE — Progress Notes (Signed)
   01/17/22 1000  Mobility  Activity Ambulated with assistance to bathroom  Level of Assistance Modified independent, requires aide device or extra time  Assistive Device Front wheel walker  Distance Ambulated (ft) 20 ft  Activity Response Tolerated well  Mobility Referral Yes  $Mobility charge 1 Mobility   Mobility Specialist Progress Note  Pt was in bed and agreeable. Had no c/o pain. Left in BR for a BM with instructions to pull the string after being done. Rn notified.   Lucious Groves Mobility Specialist  Please contact via SecureChat or Rehab office at [phone]

## 2022-01-17 NOTE — Progress Notes (Signed)
CARDIAC REHAB PHASE I    Pt resting in bed, just finished lunch. Wants to rest a little this afternoon. He reports not sleeping well at night. Ambulated 2 times today so far. Encouraged continued ambulation, increased oral intake and IS use. Able to reach 2200 today with IS. Will continue to follow.   [phone]  Vanessa Barbara, RN BSN 01/17/2022 1:12 PM

## 2022-01-17 NOTE — Progress Notes (Addendum)
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 GOLD GRAFT (N/A) TRANSESOPHAGEAL ECHOCARDIOGRAM (TEE) (N/A) THORACIC AORTIC ENDOVASCULAR STENT GRAFT (N/A) Subjective: Conts to feel pretty weak  Objective: Vital signs in last 24 hours: Temp:  [97.4 F (36.3 C)-98.3 F (36.8 C)] 98.1 F (36.7 C) (01/12 0528) Pulse Rate:  [64-72] 64 (01/12 0528) Cardiac Rhythm: Normal sinus rhythm;Bundle branch block (01/11 1905) Resp:  [17-21] 18 (01/12 0528) BP: (124-135)/(61-74) 124/73 (01/12 0528) SpO2:  [91 %-100 %] 95 % (01/12 0528) Weight:  [85.2 kg] 85.2 kg (01/12 0528)  Hemodynamic parameters for last 24 hours:    Intake/Output from previous day: 01/11 0701 - 01/12 0700 In: 150 [P.O.:150] Out: 1250 [Urine:1250] Intake/Output this shift: No intake/output data recorded.  General appearance: alert, cooperative, fatigued, and no distress Heart: regular rate and rhythm Lungs: clear to auscultation bilaterally Abdomen: benign Extremities: no edema Wound: incis healing well  Lab Results: Recent Labs    01/15/22 0252  WBC 31.1*  HGB 9.2*  HCT 28.6*  PLT 125*   BMET:  Recent Labs    01/15/22 0252  NA 134*  K 3.9  CL 101  CO2 27  GLUCOSE 110*  BUN 13  CREATININE 0.89  CALCIUM 8.1*    PT/INR: No results for input(s): "LABPROT", "INR" in the last 72 hours. ABG    Component Value Date/Time   PHART 7.369 01/10/2022 2114   HCO3 24.6 01/10/2022 2114   TCO2 26 01/10/2022 2114   ACIDBASEDEF 4.0 (H) 01/10/2022 2012   O2SAT 97 01/10/2022 2114   CBG (last 3)  No results for input(s): "GLUCAP" in the last 72 hours.  Meds Scheduled Meds:  amiodarone  400 mg Oral BID   aspirin EC  81 mg Oral Daily   metoprolol tartrate  12.5 mg Oral BID   mometasone-formoterol  2 puff Inhalation BID   And   umeclidinium bromide  1 puff Inhalation Daily    pantoprazole  40 mg Oral Daily   sodium chloride flush  3 mL Intravenous Q12H   tamsulosin  0.4 mg Oral q AM   Continuous Infusions:  sodium chloride     amiodarone 30 mg/hr (01/16/22 0614)   PRN Meds:.sodium chloride, lactulose, ondansetron **OR** ondansetron (ZOFRAN) IV, mouth rinse, oxyCODONE, senna-docusate, sodium chloride flush, traMADol  Xrays No results found.  Assessment/Plan: S/P Procedure(s) (LRB): AORTIC ARCH DEBRANCHING WITH 16 X 8 X 10 MM HEMASHIELD GOLD GRAFT (N/A) TRANSESOPHAGEAL ECHOCARDIOGRAM (TEE) (N/A) THORACIC AORTIC ENDOVASCULAR STENT GRAFT (N/A) POD #7  1 afebrile, stable vital signs, blood pressure well-controlled, sinus rhythm 2 oxygen saturations are good on room air 3 good urine output, weight at preop 4 no new Labs or x-rays, will repeat in am 5 Mobility is improving but did have an episode of dizziness which limited one of his attempts at ambulation. Generally feels weak. Will ask PT to assist   LOS: 7 days    Jason Giovanni PA-C Pager [phone] 01/17/2022   Chart reviewed, patient examined, agree with above. He walked better this afternoon, 450 ft. Says he still gets some SOB but otherwise stamina is improving. Wants laxative. I think he should be able to go home within a couple days.

## 2022-01-17 NOTE — Care Management Important Message (Signed)
Important Message  Patient Details  Name: Jason Abbott. MRN: [id number] Date of Birth: 1945-10-27   Medicare Important Message Given:  Yes     Shelda Altes 01/17/2022, 8:45 AM

## 2022-01-18 ENCOUNTER — Inpatient Hospital Stay (HOSPITAL_COMMUNITY): Payer: Medicare Other

## 2022-01-18 LAB — CBC
HCT: 28.6 % — ABNORMAL LOW (ref 39.0–52.0)
Hemoglobin: 9.5 g/dL — ABNORMAL LOW (ref 13.0–17.0)
MCH: 31.6 pg (ref 26.0–34.0)
MCHC: 33.2 g/dL (ref 30.0–36.0)
MCV: 95 fL (ref 80.0–100.0)
Platelets: 139 10*3/uL — ABNORMAL LOW (ref 150–400)
RBC: 3.01 MIL/uL — ABNORMAL LOW (ref 4.22–5.81)
RDW: 13.5 % (ref 11.5–15.5)
WBC: 25.7 10*3/uL — ABNORMAL HIGH (ref 4.0–10.5)
nRBC: 0 % (ref 0.0–0.2)

## 2022-01-18 LAB — BASIC METABOLIC PANEL
Anion gap: 8 (ref 5–15)
BUN: 10 mg/dL (ref 8–23)
CO2: 24 mmol/L (ref 22–32)
Calcium: 8.2 mg/dL — ABNORMAL LOW (ref 8.9–10.3)
Chloride: 102 mmol/L (ref 98–111)
Creatinine, Ser: 0.97 mg/dL (ref 0.61–1.24)
GFR, Estimated: >60 mL/min (ref >60)
Glucose, Bld: 110 mg/dL — ABNORMAL HIGH (ref 70–99)
Potassium: 4 mmol/L (ref 3.5–5.1)
Sodium: 134 mmol/L — ABNORMAL LOW (ref 135–145)

## 2022-01-18 NOTE — Progress Notes (Signed)
IV in R AC removed due to red/tenderness.  Dr. Lavonna Monarch aware and okay with pt not having IV access at this time.  Pt not receiving any IV meds and potential D/C home tomorrow.

## 2022-01-18 NOTE — Progress Notes (Addendum)
[facility]  Subjective  - Doing well still weak feeling but slowly improving   Objective 115/60 67 98 F (36.7 C) (Oral) 16 93%  Intake/Output Summary (Last 24 hours) at 01/18/2022 0826 Last data filed at 01/17/2022 1400 Gross per 24 hour  Intake 720 ml  Output 600 ml  Net 120 ml   Right groin soft with ecchymosis resolving B LE/UE motor intact with well perfused extremities.  No ischemic changes Lungs non labored breathing    Assessment/Planning: 77 y.o. male is 8 days post op,s/p: aortic debranching with TEVAR   Slowly improving with mobility and weakness Persistent leukocytosis with slight improvement Afebrile  Roxy Horseman 01/18/2022 8:26 AM --  Laboratory Lab Results: Recent Labs    01/17/22 0814 01/18/22 0048  WBC 26.3* 25.7*  HGB 9.6* 9.5*  HCT 29.0* 28.6*  PLT 133* 139*   BMET Recent Labs    01/17/22 0814 01/18/22 0048  NA 135 134*  K 4.0 4.0  CL 103 102  CO2 24 24  GLUCOSE 112* 110*  BUN 12 10  CREATININE 0.94 0.97  CALCIUM 8.1* 8.2*    COAG Lab Results  Component Value Date   INR 1.6 (H) 01/10/2022   INR 1.1 01/07/2022   INR 1.0 12/12/2021   No results found for: "PTT"   I have seen and evaluated the patient. I agree with the PA note as documented above.  S/p aortic debranching with TEVAR.  Leukocytosis continues to improve.  Overall he looks good.  PT pulses palpable.  Right groin looks good.  Marty Heck, MD [facility] Office: [phone]

## 2022-01-18 NOTE — Progress Notes (Signed)
CCMD notified that patient heart rate dropped to 30 b/m and patient went to A-FIB.EKG done,vitals checked,Dr weldner notified,advised to give 10 pm schedule amiodarone 400 mg and metoprolol 12.5 mg.Will continue to monitor

## 2022-01-18 NOTE — Progress Notes (Addendum)
CARDIAC REHAB PHASE I   Came to walk pt. Pt has already walked w/ nurse. Encouraged more walking throughout the day. Did ed w/ pt. Discussed wound care, staying in the tube, IS use, heart healthy diet, exercise guidelines. Pt in recliner w/ call bell in reach. Will continue to follow.   [phone] Sheppard Plumber, MS, ACSM-CEP 01/18/2022 10:44 AM

## 2022-01-18 NOTE — Evaluation (Signed)
Physical Therapy Evaluation and Discharge Patient Details Name: Jason Abbott. MRN: [id number] DOB: 06/11/1945 Today's Date: 01/18/2022  History of Present Illness  Pt is a 77 y.o. M who presents s/p TEVAR 01/10/2022. Significant PMH: stable CLL, COVID, aortic arch aneurysm, stroke.  Clinical Impression  Patient evaluated by Physical Therapy with no further acute PT needs identified. Pt overall reporting good pain control and improving activity tolerance. Pt ambulating 750 ft with a Rollator and negotiated 11 steps with bilateral railings to simulate home set up without physical assist. HR 66-84 bpm. Reviewed sternal precautions and activity recommendations/restrictions. All education has been completed and the patient has no further questions. See below for any follow-up Physical Therapy or equipment needs. PT is signing off. Thank you for this referral.      Recommendations for follow up therapy are one component of a multi-disciplinary discharge planning process, led by the attending physician.  Recommendations may be updated based on patient status, additional functional criteria and insurance authorization.  Follow Up Recommendations Other (comment) (OP Cardiac Rehab)      Assistance Recommended at Discharge PRN  Patient can return home with the following  Assistance with cooking/housework;Assist for transportation;Help with stairs or ramp for entrance    Equipment Recommendations Rollator (4 wheels)  Recommendations for Other Services       Functional Status Assessment Patient has had a recent decline in their functional status and demonstrates the ability to make significant improvements in function in a reasonable and predictable amount of time.     Precautions / Restrictions Precautions Precautions: Sternal Precaution Booklet Issued: Yes (comment) Restrictions Weight Bearing Restrictions: Yes (sternal precautions)      Mobility  Bed Mobility Overal bed mobility:  Independent             General bed mobility comments: HOB flat    Transfers Overall transfer level: Independent Equipment used: None                    Ambulation/Gait Ambulation/Gait assistance: Modified independent (Device/Increase time) Gait Distance (Feet): 750 Feet Assistive device: Rollator (4 wheels) Gait Pattern/deviations: WFL(Within Functional Limits)          Stairs Stairs: Yes Stairs assistance: Supervision Stair Management: Two rails Number of Stairs: 11 General stair comments: step over step pattern  Wheelchair Mobility    Modified Rankin (Stroke Patients Only)       Balance Overall balance assessment: Mild deficits observed, not formally tested                                           Pertinent Vitals/Pain Pain Assessment Pain Assessment: Faces Faces Pain Scale: Hurts a little bit Pain Location: incisional Pain Descriptors / Indicators: Operative site guarding Pain Intervention(s): Monitored during session    Home Living Family/patient expects to be discharged to:: Private residence Living Arrangements: Spouse/significant other Available Help at Discharge: Family;Available 24 hours/day Type of Home: House Home Access: Stairs to enter (goes in through the basement)   Entrance Stairs-Number of Steps: flight   Home Layout: Two level Home Equipment: Conservation officer, nature (2 wheels);Cane - single point      Prior Function Prior Level of Function : Independent/Modified Independent;Driving             Mobility Comments: 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  Hand Dominance   Dominant Hand: Right    Extremity/Trunk Assessment   Upper Extremity Assessment Upper Extremity Assessment: Overall WFL for tasks assessed    Lower Extremity Assessment Lower Extremity Assessment: Overall WFL for tasks assessed    Cervical / Trunk Assessment Cervical / Trunk Assessment: Normal   Communication   Communication: No difficulties  Cognition Arousal/Alertness: Awake/alert Behavior During Therapy: WFL for tasks assessed/performed Overall Cognitive Status: Within Functional Limits for tasks assessed                                          General Comments      Exercises     Assessment/Plan    PT Assessment Patient does not need any further PT services  PT Problem List         PT Treatment Interventions      PT Goals (Current goals can be found in the Care Plan section)  Acute Rehab PT Goals Patient Stated Goal: go home tomorrow, continue to play accordion PT Goal Formulation: All assessment and education complete, DC therapy    Frequency       Co-evaluation               AM-PAC PT "6 Clicks" Mobility  Outcome Measure Help needed turning from your back to your side while in a flat bed without using bedrails?: None Help needed moving from lying on your back to sitting on the side of a flat bed without using bedrails?: None Help needed moving to and from a bed to a chair (including a wheelchair)?: None Help needed standing up from a chair using your arms (e.g., wheelchair or bedside chair)?: None Help needed to walk in hospital room?: None Help needed climbing 3-5 steps with a railing? : A Little 6 Click Score: 23    End of Session   Activity Tolerance: Patient tolerated treatment well Patient left: with call bell/phone within reach;in bed   PT Visit Diagnosis: Difficulty in walking, not elsewhere classified (R26.2)    Time: 6578-4696 PT Time Calculation (min) (ACUTE ONLY): 24 min   Charges:   PT Evaluation $PT Eval Low Complexity: 1 Low PT Treatments $Therapeutic Activity: 8-22 mins        Wyona Almas, PT, DPT Acute Rehabilitation Services Office [phone]   Deno Etienne 01/18/2022, 10:59 AM

## 2022-01-18 NOTE — Progress Notes (Signed)
[address]       [employer] [address]             [phone]     8 Days Post-Op Procedure(s) (LRB): AORTIC ARCH DEBRANCHING WITH 16 X 8 X 10 MM HEMASHIELD Jason Abbott GRAFT (N/A) TRANSESOPHAGEAL ECHOCARDIOGRAM (TEE) (N/A) THORACIC AORTIC ENDOVASCULAR STENT GRAFT (N/A) Subjective: Feeling stronger  Objective: Vital signs in last 24 hours: Temp:  [97.6 F (36.4 C)-98.6 F (37 C)] 98 F (36.7 C) (01/13 0749) Pulse Rate:  [63-67] 67 (01/13 0749) Cardiac Rhythm: Normal sinus rhythm;Bundle branch block (01/12 1920) Resp:  [10-26] 16 (01/13 0749) BP: (115-140)/(60-74) 115/60 (01/13 0749) SpO2:  [93 %-97 %] 93 % (01/13 0749) Weight:  [83 kg] 83 kg (01/13 0424)  Hemodynamic parameters for last 24 hours:    Intake/Output from previous day: 01/12 0701 - 01/13 0700 In: 960 [P.O.:960] Out: 600 [Urine:600] Intake/Output this shift: No intake/output data recorded.  General appearance: alert, cooperative, and no distress Heart: regular rate and rhythm Lungs: Slightly diminished in the left base Abdomen: Benign Extremities: No edema Wound: Incisions healing well without evidence of infection  Lab Results: Recent Labs    01/17/22 0814 01/18/22 0048  WBC 26.3* 25.7*  HGB 9.6* 9.5*  HCT 29.0* 28.6*  PLT 133* 139*   BMET:  Recent Labs    01/17/22 0814 01/18/22 0048  NA 135 134*  K 4.0 4.0  CL 103 102  CO2 24 24  GLUCOSE 112* 110*  BUN 12 10  CREATININE 0.94 0.97  CALCIUM 8.1* 8.2*    PT/INR: No results for input(s): "LABPROT", "INR" in the last 72 hours. ABG    Component Value Date/Time   PHART 7.369 01/10/2022 2114   HCO3 24.6 01/10/2022 2114   TCO2 26 01/10/2022 2114   ACIDBASEDEF 4.0 (H) 01/10/2022 2012   O2SAT 97 01/10/2022 2114   CBG (last 3)  No results for input(s): "GLUCAP" in the last 72 hours.  Meds Scheduled Meds:  amiodarone  400 mg Oral BID   aspirin EC  81 mg Oral Daily   metoprolol tartrate  12.5 mg Oral BID    mometasone-formoterol  2 puff Inhalation BID   And   umeclidinium bromide  1 puff Inhalation Daily   pantoprazole  40 mg Oral Daily   sodium chloride flush  3 mL Intravenous Q12H   tamsulosin  0.4 mg Oral q AM   Continuous Infusions:  sodium chloride     amiodarone 30 mg/hr (01/16/22 0614)   PRN Meds:.sodium chloride, ondansetron **OR** ondansetron (ZOFRAN) IV, mouth rinse, oxyCODONE, senna-docusate, sodium chloride flush, traMADol  Xrays DG CHEST PORT 1 VIEW  Result Date: 01/17/2022 CLINICAL DATA:  Pneumonia. One week since cardiovascular surgery. Weakness and fatigue. EXAM: PORTABLE CHEST 1 VIEW COMPARISON:  Chest radiograph 01/14/2022. FINDINGS: Intact median sternotomy wires.  Stable thoracic aortic stent graft. Slightly improved aeration in the lung bases. No pulmonary edema. Stable small left pleural effusion. Stable mild cardiomegaly and mediastinal contours. No pneumothorax. IMPRESSION: 1. Slightly improved aeration in the lung bases. 2. Stable small left pleural effusion. 3. Stable mild cardiomegaly. Electronically Signed   By: Emmit Alexanders M.D   On: 01/17/2022 09:01    Assessment/Plan: S/P Procedure(s) (LRB): AORTIC ARCH DEBRANCHING WITH 16 X 8 X 10 MM HEMASHIELD Jason Abbott GRAFT (N/A) TRANSESOPHAGEAL ECHOCARDIOGRAM (TEE) (N/A) THORACIC AORTIC ENDOVASCULAR STENT GRAFT (N/A) POD #8  1 afebrile, SBP 115-140 range, sinus rhythm 2 oxygen saturations are good on room air  3 fair urine output, not sure if accurate, weight continues to decrease 4 normal renal function 5 leukocytosis persists, slight trend lower, clinically does not appear to be an infectious etiology, most likely reactive, previous history of CLL 6 H&H stable 7 thrombocytopenia continues to trend higher, platelet count 139 K today 8 continues to work with mobility team and physical therapy, possible home in a.m. if continues to progress well and no new issues    LOS: 8 days    John Giovanni PA-C Pager [phone] 01/18/2022

## 2022-01-19 LAB — BASIC METABOLIC PANEL
Anion gap: 8 (ref 5–15)
BUN: 13 mg/dL (ref 8–23)
CO2: 23 mmol/L (ref 22–32)
Calcium: 8.2 mg/dL — ABNORMAL LOW (ref 8.9–10.3)
Chloride: 102 mmol/L (ref 98–111)
Creatinine, Ser: 1.01 mg/dL (ref 0.61–1.24)
GFR, Estimated: >60 mL/min (ref >60)
Glucose, Bld: 150 mg/dL — ABNORMAL HIGH (ref 70–99)
Potassium: 3.9 mmol/L (ref 3.5–5.1)
Sodium: 133 mmol/L — ABNORMAL LOW (ref 135–145)

## 2022-01-19 LAB — MAGNESIUM: Magnesium: 2 mg/dL (ref 1.7–2.4)

## 2022-01-19 NOTE — Progress Notes (Signed)
[facility]  Subjective  -no complaints.  Walked in the hall multiple times yesterday.   Objective (!) 130/55 (!) 58 98.4 F (36.9 C) (Oral) 12 99%  Intake/Output Summary (Last 24 hours) at 01/19/2022 1100 Last data filed at 01/19/2022 0247 Gross per 24 hour  Intake 340 ml  Output --  Net 340 ml    Right groin clean dry and intact  Laboratory Lab Results: Recent Labs    01/17/22 0814 01/18/22 0048  WBC 26.3* 25.7*  HGB 9.6* 9.5*  HCT 29.0* 28.6*  PLT 133* 139*   BMET Recent Labs    01/17/22 0814 01/18/22 0048  NA 135 134*  K 4.0 4.0  CL 103 102  CO2 24 24  GLUCOSE 112* 110*  BUN 12 10  CREATININE 0.94 0.97  CALCIUM 8.1* 8.2*    COAG Lab Results  Component Value Date   INR 1.6 (H) 01/10/2022   INR 1.1 01/07/2022   INR 1.0 12/12/2021   No results found for: "PTT"  Assessment/Planning:  Postop day 9 status post aortic arch debranching with thoracic endovascular stent graft.  Overall looks good.  Walked in the hall multiple times yesterday.  Vascular will follow.  Marty Heck 01/19/2022 11:00 AM --

## 2022-01-19 NOTE — Progress Notes (Addendum)
[address]       [place] [address]             [phone]     9 Days Post-Op Procedure(s) (LRB): AORTIC ARCH DEBRANCHING WITH 16 X 8 X 10 MM HEMASHIELD GOLD GRAFT (N/A) TRANSESOPHAGEAL ECHOCARDIOGRAM (TEE) (N/A) THORACIC AORTIC ENDOVASCULAR STENT GRAFT (N/A) Subjective: Overall feels well, strength and activity endurance continues to improve  Objective: Vital signs in last 24 hours: Temp:  [97.6 F (36.4 C)-98.6 F (37 C)] 98.4 F (36.9 C) (01/14 0400) Pulse Rate:  [54-119] 58 (01/14 0400) Cardiac Rhythm: Other (Comment) (01/14 0115) Resp:  [12-20] 12 (01/14 0400) BP: (106-133)/(55-71) 130/55 (01/14 0400) SpO2:  [91 %-100 %] 99 % (01/14 0738) Weight:  [82.5 kg] 82.5 kg (01/14 0400)  Hemodynamic parameters for last 24 hours:    Intake/Output from previous day: 01/13 0701 - 01/14 0700 In: 580 [P.O.:580] Out: -  Intake/Output this shift: No intake/output data recorded.  General appearance: alert, cooperative, and no distress Heart: regular rate and rhythm Lungs: clear to auscultation bilaterally Abdomen: Benign Extremities: No edema Wound: Incision healing well without evidence of infection  Lab Results: Recent Labs    01/17/22 0814 01/18/22 0048  WBC 26.3* 25.7*  HGB 9.6* 9.5*  HCT 29.0* 28.6*  PLT 133* 139*   BMET:  Recent Labs    01/17/22 0814 01/18/22 0048  NA 135 134*  K 4.0 4.0  CL 103 102  CO2 24 24  GLUCOSE 112* 110*  BUN 12 10  CREATININE 0.94 0.97  CALCIUM 8.1* 8.2*    PT/INR: No results for input(s): "LABPROT", "INR" in the last 72 hours. ABG    Component Value Date/Time   PHART 7.369 01/10/2022 2114   HCO3 24.6 01/10/2022 2114   TCO2 26 01/10/2022 2114   ACIDBASEDEF 4.0 (H) 01/10/2022 2012   O2SAT 97 01/10/2022 2114   CBG (last 3)  No results for input(s): "GLUCAP" in the last 72 hours.  Meds Scheduled Meds:  amiodarone  400 mg Oral BID   aspirin EC  81 mg Oral Daily   metoprolol tartrate  12.5 mg  Oral BID   mometasone-formoterol  2 puff Inhalation BID   And   umeclidinium bromide  1 puff Inhalation Daily   pantoprazole  40 mg Oral Daily   sodium chloride flush  3 mL Intravenous Q12H   tamsulosin  0.4 mg Oral q AM   Continuous Infusions:  sodium chloride     amiodarone 30 mg/hr (01/16/22 0614)   PRN Meds:.sodium chloride, ondansetron **OR** ondansetron (ZOFRAN) IV, mouth rinse, oxyCODONE, senna-docusate, sodium chloride flush, traMADol  Xrays DG Chest 1 View  Result Date: 01/18/2022 CLINICAL DATA:  Follow-up exam. EXAM: CHEST  1 VIEW COMPARISON:  January 17, 2022 FINDINGS: A small left effusion with underlying opacity is stable. The cardiomediastinal silhouette is stable. No interval changes. IMPRESSION: Stable small left effusion with underlying opacity. No other interval changes. Electronically Signed   By: Dorise Bullion III M.D.   On: 01/18/2022 09:54   DG CHEST PORT 1 VIEW  Result Date: 01/17/2022 CLINICAL DATA:  Pneumonia. One week since cardiovascular surgery. Weakness and fatigue. EXAM: PORTABLE CHEST 1 VIEW COMPARISON:  Chest radiograph 01/14/2022. FINDINGS: Intact median sternotomy wires.  Stable thoracic aortic stent graft. Slightly improved aeration in the lung bases. No pulmonary edema. Stable small left pleural effusion. Stable mild cardiomegaly and mediastinal contours. No pneumothorax. IMPRESSION: 1. Slightly improved aeration in the lung bases.  2. Stable small left pleural effusion. 3. Stable mild cardiomegaly. Electronically Signed   By: Emmit Alexanders M.D   On: 01/17/2022 09:01    Assessment/Plan: S/P Procedure(s) (LRB): AORTIC ARCH DEBRANCHING WITH 16 X 8 X 10 MM HEMASHIELD GOLD GRAFT (N/A) TRANSESOPHAGEAL ECHOCARDIOGRAM (TEE) (N/A) THORACIC AORTIC ENDOVASCULAR STENT GRAFT (N/A) POD #9  1 afebrile, BP well-controlled, In and out of afib, rapid at times, brady at times, continue amiodarone and metoprolol, will recheck potassium and magnesium 2 oxygen  saturations are good on room air 3 voiding well, totals not recorded 4 no new labs or x-rays 5 continue physical therapy and cardiac rehab.  Continue pulmonary hygiene 6 will hold on discharge until atrial fibrillation control is improved.  Will need to determine if he will require [facility] Iv Rx     LOS: 9 days    Jason Giovanni PA-C Pager [phone] 01/19/2022 Agree with above assessment and plan Will need anticoagulation if continues intermittent afib

## 2022-01-20 MED ORDER — APIXABAN 5 MG PO TABS
5.0000 mg | ORAL_TABLET | Freq: Two times a day (BID) | ORAL | Status: DC
  Administered 2022-01-20 – 2022-01-21 (×3): 5 mg via ORAL
  Filled 2022-01-20 (×3): qty 1

## 2022-01-20 NOTE — Plan of Care (Signed)
  Problem: Health Behavior/Discharge Planning: Goal: Ability to manage health-related needs will improve Outcome: Progressing   Problem: Clinical Measurements: Goal: Ability to maintain clinical measurements within normal limits will improve Outcome: Progressing Goal: Will remain free from infection Outcome: Progressing   Problem: Elimination: Goal: Will not experience complications related to bowel motility Outcome: Progressing   Problem: Pain Managment: Goal: General experience of comfort will improve Outcome: Progressing   

## 2022-01-20 NOTE — Progress Notes (Addendum)
  Progress Note    01/20/2022 7:14 AM 10 Days Post-Op  Subjective:  feeling better, wants to walk this morning    Vitals:   01/19/22 2259 01/20/22 0401  BP:  126/63  Pulse:  62  Resp:  12  Temp: 98.6 F (37 C) 97.9 F (36.6 C)  SpO2:  95%    Physical Exam: Cardiac:  NSR with intermittent afib Lungs:  nonlabored Incisions: sternal incision intact and dry Extremities:  R groin intact with resolving bruising. BLE and BUE well perfused with intact motor and sensation   CBC    Component Value Date/Time   WBC 25.7 (H) 01/18/2022 0048   RBC 3.01 (L) 01/18/2022 0048   HGB 9.5 (L) 01/18/2022 0048   HGB 14.6 08/26/2021 0943   HCT 28.6 (L) 01/18/2022 0048   PLT 139 (L) 01/18/2022 0048   PLT 146 (L) 08/26/2021 0943   MCV 95.0 01/18/2022 0048   MCH 31.6 01/18/2022 0048   MCHC 33.2 01/18/2022 0048   RDW 13.5 01/18/2022 0048   LYMPHSABS 17.3 (H) 09/13/2021 0712   MONOABS 0.5 09/13/2021 0712   EOSABS 0.0 09/13/2021 0712   BASOSABS 0.0 09/13/2021 0712    BMET    Component Value Date/Time   NA 133 (L) 01/19/2022 1048   K 3.9 01/19/2022 1048   CL 102 01/19/2022 1048   CO2 23 01/19/2022 1048   GLUCOSE 150 (H) 01/19/2022 1048   BUN 13 01/19/2022 1048   CREATININE 1.01 01/19/2022 1048   CREATININE 1.04 08/26/2021 0943   CREATININE 1.03 08/15/2021 0000   CALCIUM 8.2 (L) 01/19/2022 1048   GFRNONAA >60 01/19/2022 1048   GFRNONAA >60 08/26/2021 0943   GFRNONAA 84 03/30/2019 1603   GFRAA >60 08/22/2019 1011   GFRAA 97 03/30/2019 1603    INR    Component Value Date/Time   INR 1.6 (H) 01/10/2022 1544     Intake/Output Summary (Last 24 hours) at 01/20/2022 7622 Last data filed at 01/20/2022 0400 Gross per 24 hour  Intake 200 ml  Output --  Net 200 ml      Assessment/Plan:  77 y.o. male is 10 days post op,s/p: aortic debranching with TEVAR    -Bilateral upper and lower extremities well perfused with intact motor and sensation. Improving in mobility -R groin  soft with resolving ecchymosis -Sternal incision clean and intact -Continue mobilization   [name], PA-C [facility] [phone] 01/20/2022 7:14 AM

## 2022-01-20 NOTE — Progress Notes (Addendum)
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 (N/A) TRANSESOPHAGEAL ECHOCARDIOGRAM (TEE) (N/A) THORACIC AORTIC ENDOVASCULAR STENT GRAFT (N/A) Subjective: Some musculoskeletal left shoulder discomfort  Objective: Vital signs in last 24 hours: Temp:  [97.5 F (36.4 C)-98.6 F (37 C)] 97.9 F (36.6 C) (01/15 0401) Pulse Rate:  [60-110] 62 (01/15 0401) Cardiac Rhythm: Normal sinus rhythm;Bundle branch block (01/14 1900) Resp:  [12-19] 12 (01/15 0401) BP: (103-129)/(52-81) 126/63 (01/15 0401) SpO2:  [92 %-99 %] 95 % (01/15 0401) Weight:  [82.2 kg] 82.2 kg (01/15 0403)  Hemodynamic parameters for last 24 hours:    Intake/Output from previous day: 01/14 0701 - 01/15 0700 In: 200 [P.O.:200] Out: -  Intake/Output this shift: No intake/output data recorded.  General appearance: alert, cooperative, and no distress Heart: regular rate and rhythm Lungs: clear to auscultation bilaterally Abdomen: benign Extremities: no edema, warm and well perfused Wound: incis healing well  Lab Results: Recent Labs    01/17/22 0814 01/18/22 0048  WBC 26.3* 25.7*  HGB 9.6* 9.5*  HCT 29.0* 28.6*  PLT 133* 139*   BMET:  Recent Labs    01/18/22 0048 01/19/22 1048  NA 134* 133*  K 4.0 3.9  CL 102 102  CO2 24 23  GLUCOSE 110* 150*  BUN 10 13  CREATININE 0.97 1.01  CALCIUM 8.2* 8.2*    PT/INR: No results for input(s): "LABPROT", "INR" in the last 72 hours. ABG    Component Value Date/Time   PHART 7.369 01/10/2022 2114   HCO3 24.6 01/10/2022 2114   TCO2 26 01/10/2022 2114   ACIDBASEDEF 4.0 (H) 01/10/2022 2012   O2SAT 97 01/10/2022 2114   CBG (last 3)  No results for input(s): "GLUCAP" in the last 72 hours.  Meds Scheduled Meds:  amiodarone  400 mg Oral BID   aspirin EC  81 mg Oral Daily   metoprolol tartrate  12.5 mg Oral BID    mometasone-formoterol  2 puff Inhalation BID   And   umeclidinium bromide  1 puff Inhalation Daily   pantoprazole  40 mg Oral Daily   sodium chloride flush  3 mL Intravenous Q12H   tamsulosin  0.4 mg Oral q AM   Continuous Infusions:  sodium chloride     amiodarone 30 mg/hr (01/16/22 0614)   PRN Meds:.sodium chloride, ondansetron **OR** ondansetron (ZOFRAN) IV, mouth rinse, oxyCODONE, senna-docusate, sodium chloride flush, traMADol  Xrays No results found.  Assessment/Plan: S/P Procedure(s) (LRB): AORTIC ARCH DEBRANCHING WITH 16 X 8 X 10 MM HEMASHIELD GOLD GRAFT (N/A) TRANSESOPHAGEAL ECHOCARDIOGRAM (TEE) (N/A) THORACIC AORTIC ENDOVASCULAR STENT GRAFT (N/A)  POD #10  1 afebrile, vital signs stable, conts to have some intermit afib w/ some RVR, some brady, RBBB, d/w Dr Cyndia Bent, will begin eliquis 2 oxygen sats good on RA 3 voiding-amounts not recorded, weight stable at 82 kg, normal renal function 4  magnesium and potassium-WNL 5 cont rehab and routine pulm hygiene       LOS: 10 days    John Giovanni PA-C pager [phone] 01/20/2022    Chart reviewed, patient examined, agree with above. He is in sinus most of the time on po amio and low dose Lopressor. I don't think we can push up Lopressor dose with resting sinus  brady at times. I think he current regimen should be ok. Will check ECG in am. QTc has been prolonged but less than 600. Eliquis started for recurrent AF. Plan home tomorrow if no changes and weather ok.

## 2022-01-20 NOTE — Progress Notes (Signed)
CARDIAC REHAB PHASE I   PRE:  Rate/Rhythm: 105-119 afib in bed    BP: sitting 142/76    SaO2: 96 RA  MODE:  Ambulation: 420 ft   POST:  Rate/Rhythm: 140 afib    BP: sitting 129/84     SaO2: 95 RA  Pt in afib on my arrival. Able to move out of bed independently and walk independently in hall with RW. C/o dizziness but able to rest then resume walk. HR mostly 130s afib while up. Return to bed, pt sts he was unable to eat this morning. Encouraged x2 more walks and IS.  [phone]   Yves Dill BS, ACSM-CEP 01/20/2022 9:41 AM

## 2022-01-20 NOTE — Progress Notes (Signed)
   01/20/22 1400  Mobility  Activity Ambulated with assistance in hallway  Level of Assistance Standby assist, set-up cues, supervision of patient - no hands on  Assistive Device Front wheel walker  Distance Ambulated (ft) 400 ft  Activity Response Tolerated well  Mobility Referral Yes  $Mobility charge 1 Mobility   Mobility Specialist Progress Note  Pre-Mobility: 68 HR During Mobility: 121-73 HR Post-Mobility: 62 HR  Pt was in bed and agreeable. Had no c/o pain throughout. Returned to bed w/ all needs met and call bell in reach   [facility] Specialist  Please contact via Solicitor or Rehab office at [phone]

## 2022-01-20 NOTE — TOC Progression Note (Signed)
Transition of Care (TOC) - Progression Note    Patient Details  Name: Jason Abbott. MRN: [id number] Date of Birth: 1945-07-22  Transition of Care [facility]) CM/SW Contact  Levonne Lapping, RN Phone Number: 01/20/2022, 4:21 PM  Clinical Narrative:     Met with Patient bedside to discuss dc plan.  Patient will return home with Wife . Patient will attend OP Cardiac Rehab. CM has ordered Rollator as recommended. Patient has no preference of providers and is agreeable with [employer]. TOC will continue to follow patient for any additional discharge needs     Expected Discharge Plan: Home/Self Care Barriers to Discharge: Continued Medical Work up  Expected Discharge Plan and Services In-house Referral: NA Discharge Planning Services: CM Consult   Living arrangements for the past 2 months: Single Family Home                   DME Agency: NA                   Social Determinants of Health (SDOH) Interventions SDOH Screenings   Food Insecurity: No Food Insecurity (12/20/2021)  Housing: Low Risk  (12/20/2021)  Transportation Needs: No Transportation Needs (12/20/2021)  Depression (PHQ2-9): Low Risk  (02/28/2020)  Financial Resource Strain: Low Risk  (09/16/2021)  Tobacco Use: Medium Risk (01/15/2022)    Readmission Risk Interventions    09/13/2021   11:06 AM  Readmission Risk Prevention Plan  Transportation Screening Complete  PCP or Specialist Appt within 5-7 Days Complete  Home Care Screening Complete  Medication Review (RN CM) Complete

## 2022-01-21 ENCOUNTER — Other Ambulatory Visit (HOSPITAL_COMMUNITY): Payer: Self-pay

## 2022-01-21 ENCOUNTER — Ambulatory Visit: Payer: Medicare Other

## 2022-01-21 DIAGNOSIS — Z8679 Personal history of other diseases of the circulatory system: Secondary | ICD-10-CM

## 2022-01-21 DIAGNOSIS — I4891 Unspecified atrial fibrillation: Secondary | ICD-10-CM | POA: Insufficient documentation

## 2022-01-21 MED ORDER — BREZTRI AEROSPHERE 160-9-4.8 MCG/ACT IN AERO: 2.0000 | INHALATION_SPRAY | Freq: Every day | RESPIRATORY_TRACT | Status: DC

## 2022-01-21 MED ORDER — OXYCODONE HCL 5 MG PO TABS: 5.0000 mg | ORAL_TABLET | Freq: Four times a day (QID) | ORAL | 0 refills | Status: AC | PRN

## 2022-01-21 MED ORDER — METOPROLOL TARTRATE 25 MG PO TABS: 12.5000 mg | ORAL_TABLET | Freq: Two times a day (BID) | ORAL | 1 refills | Status: DC

## 2022-01-21 MED ORDER — AMIODARONE HCL 200 MG PO TABS: 200.0000 mg | ORAL_TABLET | Freq: Two times a day (BID) | ORAL | 1 refills | Status: DC

## 2022-01-21 MED ORDER — APIXABAN 5 MG PO TABS: 5.0000 mg | ORAL_TABLET | Freq: Two times a day (BID) | ORAL | 1 refills | Status: DC

## 2022-01-21 NOTE — Progress Notes (Signed)
Discharge instructions given. Patient verbalized understanding and all questions were answered.  ?

## 2022-01-21 NOTE — Progress Notes (Addendum)
Pt reports chest pain (left side) 2/10. VS 113/71, P53, RR 20. Pt describes pain as aching and has been ongoing for more than 30 minutes. Pt denies pain in other areas. EKG completed and placed in pt chart. Pain medication administered. Justice Rocher, MD notified.  0136 Orders given.

## 2022-01-21 NOTE — Progress Notes (Signed)
CT sutures removed per MD order without difficulty.

## 2022-01-21 NOTE — Progress Notes (Signed)
  Progress Note    01/21/2022 7:45 AM 11 Days Post-Op  Subjective:  feeling good and ready to go home   Vitals:   01/21/22 0116 01/21/22 0740  BP: 113/71   Pulse: (!) 53 66  Resp: 20   Temp: (!) 97.5 F (36.4 C)   SpO2:      Physical Exam: Cardiac:  NSR Lungs:  nonlabored Incisions:  clean and dry Extremities:  R groin intact with some bruising   CBC    Component Value Date/Time   WBC 25.7 (H) 01/18/2022 0048   RBC 3.01 (L) 01/18/2022 0048   HGB 9.5 (L) 01/18/2022 0048   HGB 14.6 08/26/2021 0943   HCT 28.6 (L) 01/18/2022 0048   PLT 139 (L) 01/18/2022 0048   PLT 146 (L) 08/26/2021 0943   MCV 95.0 01/18/2022 0048   MCH 31.6 01/18/2022 0048   MCHC 33.2 01/18/2022 0048   RDW 13.5 01/18/2022 0048   LYMPHSABS 17.3 (H) 09/13/2021 0712   MONOABS 0.5 09/13/2021 0712   EOSABS 0.0 09/13/2021 0712   BASOSABS 0.0 09/13/2021 0712    BMET    Component Value Date/Time   NA 133 (L) 01/19/2022 1048   K 3.9 01/19/2022 1048   CL 102 01/19/2022 1048   CO2 23 01/19/2022 1048   GLUCOSE 150 (H) 01/19/2022 1048   BUN 13 01/19/2022 1048   CREATININE 1.01 01/19/2022 1048   CREATININE 1.04 08/26/2021 0943   CREATININE 1.03 08/15/2021 0000   CALCIUM 8.2 (L) 01/19/2022 1048   GFRNONAA >60 01/19/2022 1048   GFRNONAA >60 08/26/2021 0943   GFRNONAA 84 03/30/2019 1603   GFRAA >60 08/22/2019 1011   GFRAA 97 03/30/2019 1603    INR    Component Value Date/Time   INR 1.6 (H) 01/10/2022 1544     Intake/Output Summary (Last 24 hours) at 01/21/2022 0745 Last data filed at 01/20/2022 1500 Gross per 24 hour  Intake 2307.8 ml  Output --  Net 2307.8 ml      Assessment/Plan:  77 y.o. male is 11 days post op,s/p: aortic debranching with TEVAR   -NSR overnight with no a-fib -All incisions clean and dry. Right groin intact with resolving bruising -Palpable pulses in all extremities with good strength -Has increased in mobilization and hopeful for d/c today. Clear from vascular  standpoint for discharge  [name], PA-C [facility] [phone] 01/21/2022 7:45 AM

## 2022-01-21 NOTE — TOC Transition Note (Signed)
Transition of Care [facility]) - CM/SW Discharge Note   Patient Details  Name: Jason Abbott. MRN: [id number] Date of Birth: [birth year]  Transition of Care [facility]) CM/SW Contact:  Levonne Lapping, RN Phone Number: 01/21/2022, 10:21 AM   Clinical Narrative:    Patient to dc to home today with Wife. [employer] delivering the rollator . To bedside.  Patient will follow up at [facility] as directed. No additional TOC needs       Barriers to Discharge: Continued Medical Work up   Patient Goals and CMS Choice      Discharge Placement                         Discharge Plan and Services Additional resources added to the After Visit Summary for   In-house Referral: NA Discharge Planning Services: CM Consult              DME Agency: NA                  Social Determinants of Health ([place]) Interventions SDOH Screenings   Food Insecurity: No Food Insecurity (01/21/2022)  Housing: Low Risk  (01/21/2022)  Transportation Needs: No Transportation Needs (01/21/2022)  Utilities: Not At Risk (01/21/2022)  Depression (PHQ2-9): Low Risk  (02/28/2020)  Financial Resource Strain: Low Risk  (09/16/2021)  Tobacco Use: Medium Risk (01/15/2022)     Readmission Risk Interventions    09/13/2021   11:06 AM  Readmission Risk Prevention Plan  Transportation Screening Complete  PCP or Specialist Appt within 5-7 Days Complete  Home Care Screening Complete  Medication Review (RN CM) Complete

## 2022-01-21 NOTE — Progress Notes (Signed)
[address]       [place] [address]             [phone]     11 Days Post-Op Procedure(s) (LRB): AORTIC ARCH DEBRANCHING WITH 16 X 8 X 10 MM HEMASHIELD Jason Abbott (N/A) TRANSESOPHAGEAL ECHOCARDIOGRAM (TEE) (N/A) THORACIC AORTIC ENDOVASCULAR STENT Abbott (N/A) Subjective: Feels well  Objective: Vital signs in last 24 hours: Temp:  [97.4 F (36.3 C)-97.9 F (36.6 C)] 97.5 F (36.4 C) (01/16 0116) Pulse Rate:  [53-62] 53 (01/16 0116) Cardiac Rhythm: Normal sinus rhythm;Bundle branch block (01/15 1900) Resp:  [16-20] 20 (01/16 0116) BP: (105-132)/(60-71) 113/71 (01/16 0116) SpO2:  [92 %-98 %] 98 % (01/15 2330) Weight:  [81.5 kg] 81.5 kg (01/16 0110)  Hemodynamic parameters for last 24 hours:    Intake/Output from previous day: 01/15 0701 - 01/16 0700 In: 2307.8 [P.O.:480; I.V.:1827.8] Out: -  Intake/Output this shift: No intake/output data recorded.  General appearance: alert, cooperative, and no distress Heart: regular rate and rhythm Lungs: clear to auscultation bilaterally Abdomen: Benign Extremities: No edema Wound: Incision healing well without evidence of infection  Lab Results: No results for input(s): "WBC", "HGB", "HCT", "PLT" in the last 72 hours. BMET:  Recent Labs    01/19/22 1048  NA 133*  K 3.9  CL 102  CO2 23  GLUCOSE 150*  BUN 13  CREATININE 1.01  CALCIUM 8.2*    PT/INR: No results for input(s): "LABPROT", "INR" in the last 72 hours. ABG    Component Value Date/Time   PHART 7.369 01/10/2022 2114   HCO3 24.6 01/10/2022 2114   TCO2 26 01/10/2022 2114   ACIDBASEDEF 4.0 (H) 01/10/2022 2012   O2SAT 97 01/10/2022 2114   CBG (last 3)  No results for input(s): "GLUCAP" in the last 72 hours.  Meds Scheduled Meds:  amiodarone  400 mg Oral BID   apixaban  5 mg Oral BID   aspirin EC  81 mg Oral Daily   metoprolol tartrate  12.5 mg Oral BID   mometasone-formoterol  2 puff Inhalation BID   And   umeclidinium  bromide  1 puff Inhalation Daily   pantoprazole  40 mg Oral Daily   sodium chloride flush  3 mL Intravenous Q12H   tamsulosin  0.4 mg Oral q AM   Continuous Infusions:  sodium chloride     PRN Meds:.sodium chloride, ondansetron **OR** ondansetron (ZOFRAN) IV, mouth rinse, oxyCODONE, senna-docusate, sodium chloride flush, traMADol  Xrays No results found.  Assessment/Plan: S/P Procedure(s) (LRB): AORTIC ARCH DEBRANCHING WITH 16 X 8 X 10 MM HEMASHIELD Jason Abbott (N/A) TRANSESOPHAGEAL ECHOCARDIOGRAM (TEE) (N/A) THORACIC AORTIC ENDOVASCULAR STENT Abbott (N/A) Postop day 11  1 afebrile, stable vital signs, sinus rhythm bradycardia in the 50s, RBBB, no further A-fib 2 oxygen saturations on room air 3 weight below preop 5 no new labs 6 stable for discharge, chest tube sutures have been long enough to remove while in the hospital.  LOS: 11 days    Jason Giovanni PA-C Pager [phone] 01/21/2022

## 2022-01-22 ENCOUNTER — Telehealth: Payer: Self-pay | Admitting: *Deleted

## 2022-01-22 ENCOUNTER — Encounter: Payer: Self-pay | Admitting: *Deleted

## 2022-01-22 NOTE — Patient Outreach (Signed)
Care Coordination Jason Abbott Note Transition Care Management Follow-up Telephone Call Date of discharge and from where: 01/21/22; [facility] Abbott; thoracic aortic aneurysm with surgical repair  How have you been since you were released from the [facility]? Per spouse/ caregiver Jason Abbott, on SW Jason Abbott DPR: "He's doing really good; he is able to do all of his self-care on his own and is actually taking a shower now.  I am helping him with all of his medications and have quite a few questions, so yes, I appreciate you providing all of this information.  We will think about whether or not he needs any nurse or physical therapist and ask Dr. Cyndia Abbott about it if we think it would be beneficial.  I am glad to know that he did not qualify for cardiac rehabilitation- thanks for telling us that." Any questions or concerns? No  Items Reviewed: Did the pt receive and understand the discharge instructions provided? Yes  Medications obtained and verified? Yes  Other? No  Any new allergies since your discharge? Yes  Dietary orders reviewed? Yes Do you have support at home? Yes  [name] assists with all care needs as indicated/ needed; reports patient is essentially independent in all self-care  Home Care and Equipment/Supplies: Were home health services ordered? no If so, what is the name of the agency? N/A  Has the agency set up a time to come to the patient's home? not applicable Were any new equipment or medical supplies ordered?  Yes: rollator walker What is the name of the medical supply agency? [facility] Were you able to get the supplies/equipment? yes Do you have any questions related to the use of the equipment or supplies? No  Functional Questionnaire: (I = Independent and D = Dependent) ADLs: I  [name] assists with all care needs as indicated  Bathing/Dressing- I  Meal Prep- I  [name] assists with all care needs as indicated  Eating- I  Maintaining continence- I  Transferring/Ambulation-  I  Managing Meds- I  [name] assists with all care needs as indicated  Follow up appointments reviewed:  PCP Abbott f/u appt confirmed? No  Scheduled to see - on - @ Jason Abbott f/u appt confirmed? Yes  Scheduled to see Dr. Cyndia Abbott, cardiothoracic surgeon on Wednesday 02/05/22 @ 12:00 noon Are transportation arrangements needed? No  If their condition worsens, is the pt aware to call PCP or go to the Emergency Dept.? Yes Was the patient provided with contact information for the PCP's office or ED? No- spouse declines; reports already has contact information for all care providers Was to pt encouraged to call back with questions or concerns? Yes provided my direct contact information should any questions/ concerns/ needs arise post-TOC call today: spouse declines need for regular/ ongoing care coordination outreach at this time, states she will consider  SDOH assessments and interventions completed:   Yes SDOH Interventions Today    Flowsheet Row Most Recent Value  SDOH Interventions   Food Insecurity Interventions Intervention Not Indicated  Transportation Interventions Intervention Not Indicated  [patient's spouse she will be driving patient until cleared by surgery to resume driving self]      Care Coordination Interventions:  Full medication review completed; Provided extensive education around signs/ symptoms A-Fib along with corresponding action plan; provided education around safe use of newly prescribed ACT and action plan for any falls/ head trauma while on ACT; confirmed cardiac rehabilitation referral denied and shared with spouse, along with reason for denial and provided  education around how to initiated subsequent home health or outpatient PT services; provided educational material via MyChart re: ACT therapy and A-Fib      Encounter Outcome:  Pt. Visit Completed    Jason Rack, RN, BSN, CCRN Alumnus RN CM Care Coordination/ Transition of [place] Management [phone]: direct office

## 2022-01-22 NOTE — Patient Instructions (Signed)
Visit Information  Thank you for taking time to visit with me today. Please don't hesitate to contact me if I can be of assistance to you.   Please see attached educational material as we discussed today.  Please do not hesitate to contact me if I can be of assistance to you in the future  Oneta Rack, RN, BSN, CCRN Alumnus RN CM Care Coordination/ Transition of [place] Management [phone]: direct office    If you are experiencing a Mental Health or [place] or need someone to talk to, please call the Suicide and Crisis Lifeline: 988 call the [place] National Suicide Prevention Lifeline: 937-131-0765 or TTY: 223-859-7822 TTY (315)526-8001) to talk to a trained counselor call 1-800-273-TALK (toll free, 24 hour hotline) go to [facility] [address], [place] [phone]) call the [place]: [phone] call 911   Patient verbalizes understanding of instructions and care plan provided today and agrees to view in [place]. Active MyChart status and patient understanding of how to access instructions and care plan via MyChart confirmed with patient.     No further follow up required: No further care coordination needs identified; patient provided my direct contact information should questions/ concerns, or needs arise in the future

## 2022-01-28 ENCOUNTER — Encounter: Payer: Self-pay | Admitting: Sports Medicine

## 2022-01-29 ENCOUNTER — Telehealth: Payer: Self-pay | Admitting: *Deleted

## 2022-01-29 ENCOUNTER — Ambulatory Visit (INDEPENDENT_AMBULATORY_CARE_PROVIDER_SITE_OTHER): Payer: Medicare Other

## 2022-01-29 ENCOUNTER — Other Ambulatory Visit: Payer: Self-pay | Admitting: *Deleted

## 2022-01-29 ENCOUNTER — Other Ambulatory Visit: Payer: Self-pay | Admitting: Physician Assistant

## 2022-01-29 DIAGNOSIS — Z9889 Other specified postprocedural states: Secondary | ICD-10-CM

## 2022-01-29 DIAGNOSIS — Z8679 Personal history of other diseases of the circulatory system: Secondary | ICD-10-CM

## 2022-01-29 DIAGNOSIS — Z95828 Presence of other vascular implants and grafts: Secondary | ICD-10-CM | POA: Diagnosis not present

## 2022-01-29 DIAGNOSIS — I517 Cardiomegaly: Secondary | ICD-10-CM | POA: Diagnosis not present

## 2022-01-29 MED ORDER — OXYCODONE HCL 5 MG PO CAPS: 5.0000 mg | ORAL_CAPSULE | Freq: Four times a day (QID) | ORAL | 0 refills | Status: DC | PRN

## 2022-01-29 MED ORDER — TAMSULOSIN HCL 0.4 MG PO CAPS: 0.4000 mg | ORAL_CAPSULE | Freq: Every morning | ORAL | 3 refills | Status: DC

## 2022-01-29 MED FILL — Heparin Sodium (Porcine) Inj 1000 Unit/ML: INTRAMUSCULAR | Qty: 30 | Status: AC

## 2022-01-29 MED FILL — Sodium Chloride IV Soln 0.9%: INTRAVENOUS | Qty: 3000 | Status: AC

## 2022-01-29 NOTE — Telephone Encounter (Signed)
Patient and wife contacted the office stating patient has been feeling weak and has a HR of 48. Per wife his weakness continues to increase. He c/o SOB at rest. States his appetite has been fine and he has been drinking fluids. Wife mentioned patient has accidentally been taking a full '25mg'$  of metoprolol BID instead of the prescribed 12.'5mg'$  BID. Patient and wife realized this error yesterday and he has since taken two doses of 12.'5mg'$  metoprolol. HR has been ranging between 48-60. Patient has not been monitoring BP. BP was taken by wife over the phone with a recording of 121/72. Pulse ox of 98%. Per Dr. Cyndia Bent, patient advised to stop taking metoprolol. CXR ordered and reviewed by MD. Advised patient and wife of no active concerns. Per Dr. Cyndia Bent refill of oxycodone sent to patient's preferred pharmacy. Advised wife to call our office back Friday morning if patient continues to feel weak. ECHO can be ordered at that time. Wife verbalized understanding.

## 2022-01-31 ENCOUNTER — Other Ambulatory Visit: Payer: Self-pay | Admitting: Surgery

## 2022-01-31 ENCOUNTER — Telehealth: Payer: Self-pay | Admitting: *Deleted

## 2022-01-31 ENCOUNTER — Ambulatory Visit (HOSPITAL_COMMUNITY)
Admission: RE | Admit: 2022-01-31 | Discharge: 2022-01-31 | Disposition: A | Discharge status: Home / Self Care | Payer: Medicare Other | Source: Ambulatory Visit | Attending: Sports Medicine | Admitting: Sports Medicine

## 2022-01-31 ENCOUNTER — Other Ambulatory Visit: Payer: Self-pay | Admitting: *Deleted

## 2022-01-31 ENCOUNTER — Encounter: Payer: Self-pay | Admitting: Surgery

## 2022-01-31 ENCOUNTER — Ambulatory Visit (INDEPENDENT_AMBULATORY_CARE_PROVIDER_SITE_OTHER): Payer: Self-pay | Admitting: Surgery

## 2022-01-31 ENCOUNTER — Ambulatory Visit (HOSPITAL_COMMUNITY)
Admission: RE | Admit: 2022-01-31 | Discharge: 2022-01-31 | Disposition: A | Discharge status: Home / Self Care | Payer: Medicare Other | Source: Ambulatory Visit | Attending: Surgery | Admitting: Surgery

## 2022-01-31 VITALS — BP 99/65 | HR 83 | Temp 97.8°F | Resp 18 | Ht 70.0 in | Wt 178.0 lb

## 2022-01-31 DIAGNOSIS — I3139 Other pericardial effusion (noninflammatory): Secondary | ICD-10-CM

## 2022-01-31 DIAGNOSIS — I7121 Aneurysm of the ascending aorta, without rupture: Secondary | ICD-10-CM

## 2022-01-31 DIAGNOSIS — Z8679 Personal history of other diseases of the circulatory system: Secondary | ICD-10-CM

## 2022-01-31 DIAGNOSIS — Z9889 Other specified postprocedural states: Secondary | ICD-10-CM | POA: Insufficient documentation

## 2022-01-31 DIAGNOSIS — E785 Hyperlipidemia, unspecified: Secondary | ICD-10-CM | POA: Diagnosis not present

## 2022-01-31 DIAGNOSIS — I4891 Unspecified atrial fibrillation: Secondary | ICD-10-CM | POA: Insufficient documentation

## 2022-01-31 DIAGNOSIS — I517 Cardiomegaly: Secondary | ICD-10-CM | POA: Diagnosis not present

## 2022-01-31 LAB — CBC WITH DIFFERENTIAL/PLATELET
Abs Immature Granulocytes: 0 10*3/uL (ref 0.00–0.07)
Basophils Absolute: 0.2 10*3/uL — ABNORMAL HIGH (ref 0.0–0.1)
Basophils Relative: 1 %
Eosinophils Absolute: 0.5 10*3/uL (ref 0.0–0.5)
Eosinophils Relative: 2 %
HCT: 37.4 % — ABNORMAL LOW (ref 39.0–52.0)
Hemoglobin: 12.1 g/dL — ABNORMAL LOW (ref 13.0–17.0)
Lymphocytes Relative: 70 %
Lymphs Abs: 16.9 10*3/uL — ABNORMAL HIGH (ref 0.7–4.0)
MCH: 30 pg (ref 26.0–34.0)
MCHC: 32.4 g/dL (ref 30.0–36.0)
MCV: 92.6 fL (ref 80.0–100.0)
Monocytes Absolute: 1.2 10*3/uL — ABNORMAL HIGH (ref 0.1–1.0)
Monocytes Relative: 5 %
Neutro Abs: 5.3 10*3/uL (ref 1.7–7.7)
Neutrophils Relative %: 22 %
Platelets: 232 10*3/uL (ref 150–400)
RBC: 4.04 MIL/uL — ABNORMAL LOW (ref 4.22–5.81)
RDW: 13.6 % (ref 11.5–15.5)
WBC: 24.2 10*3/uL — ABNORMAL HIGH (ref 4.0–10.5)
nRBC: 0 % (ref 0.0–0.2)

## 2022-01-31 LAB — BASIC METABOLIC PANEL
Anion gap: 8 (ref 5–15)
BUN: 13 mg/dL (ref 8–23)
CO2: 26 mmol/L (ref 22–32)
Calcium: 8.8 mg/dL — ABNORMAL LOW (ref 8.9–10.3)
Chloride: 101 mmol/L (ref 98–111)
Creatinine, Ser: 1.16 mg/dL (ref 0.61–1.24)
GFR, Estimated: >60 mL/min (ref >60)
Glucose, Bld: 133 mg/dL — ABNORMAL HIGH (ref 70–99)
Potassium: 4 mmol/L (ref 3.5–5.1)
Sodium: 135 mmol/L (ref 135–145)

## 2022-01-31 LAB — ECHOCARDIOGRAM COMPLETE: S' Lateral: 4.5 cm

## 2022-01-31 NOTE — Telephone Encounter (Signed)
Patient's wife [name] contacted the office stating patient continues to feel weaker by the day. VS taken 118/60, HR 59, O2 sat 98%. Per wife, patient has no appetite and likely is not drinking as much as he should. As discussed with Dr. Cyndia Bent on Wednesday, STAT echo ordered. Follow up appt with Dr. Cyndia Bent scheduled for today. Patient and wife notified of appt times.

## 2022-01-31 NOTE — Progress Notes (Signed)
  Echocardiogram 2D Echocardiogram has been performed.  Johny Chess 01/31/2022, 2:15 PM

## 2022-01-31 NOTE — Progress Notes (Signed)
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  He had undergone a right carotid-subclavian transposition by Dr. Trula Slade preoperatively in preparation for the surgery.  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  His postoperative course was somewhat slow due to his age and general deconditioning.  He did go into atrial fibrillation and was started on amiodarone and Lopressor with conversion to sinus rhythm.  He was also started on Eliquis due to recurrence of his atrial fibrillation.  He had some bradycardia in the 50s which limited up titration of his beta-blocker.  He also had right bundle branch block on EKG.  He returns today after calling the office reporting that he has been feeling weaker every day with poor appetite.  He reports some shortness of breath but not consistently.  He has had significant chest wall discomfort which has made it difficult to sleep.  I asked him to discontinue his Lopressor 2 days ago in case that was causing his weakness.  Current Outpatient Medications  Medication Sig Dispense Refill   amiodarone (PACERONE) 200 MG tablet Take 1 tablet (200 mg total) by mouth 2 (two) times daily. 60 tablet 1   apixaban (ELIQUIS) 5 MG TABS tablet Take 1 tablet (5 mg total) by mouth 2 (two) times daily. 60 tablet 1   aspirin EC 81 MG tablet Take 1 tablet (81 mg total) by mouth daily at 6 (six) AM. Swallow whole. 30 tablet 12   Budeson-Glycopyrrol-Formoterol (BREZTRI AEROSPHERE) 160-9-4.8 MCG/ACT AERO Inhale 2 puffs into the lungs daily.     Cranberry-Vitamin C-Probiotic (AZO CRANBERRY PO) Take 1 each by mouth in the morning.     Cyanocobalamin (VITAMIN B 12 PO) Take 1 tablet by  mouth in the morning.     Multiple Vitamins-Minerals (MULTIVITAMIN GUMMIES ADULTS PO) Take 2 tablets by mouth in the morning.     oxycodone (OXY-IR) 5 MG capsule Take 1 capsule (5 mg total) by mouth every 6 (six) hours as needed. 20 capsule 0   tamsulosin (FLOMAX) 0.4 MG CAPS capsule Take 1 capsule (0.4 mg total) by mouth in the morning. 90 capsule 3   No current facility-administered medications for this visit.     Physical Exam: BP 99/65 (BP Location: Left Arm, Patient Position: Sitting)   Pulse 83   Temp 97.8 F (36.6 C)   Resp 18   Ht '5\' 10"'$  (1.778 m)   Wt 178 lb (80.7 kg) Comment: reports 173lb at home  SpO2 94% Comment: RA  BMI 25.54 kg/m  He looks better to me than he did when he went home. Cardiac exam shows an irregular rate and rhythm with normal heart sounds.  There is no murmur.  There is no rub. Lung exam is clear. The chest incision is well-healed and the sternum is stable. There is no peripheral edema.  Diagnostic Tests:  ECHOCARDIOGRAM REPORT       Patient Name:   Jason Abbott. Date of Exam: 01/31/2022  Medical Rec #:  [id number]           Height:       70.0 in  Accession #:    [id number]          Weight:  179.6 lb  Date of Birth:  [birth year]           BSA:          1.994 m  Patient Age:    39 years            BP:           95/68 mmHg  Patient Gender: M                   HR:           109 bpm.  Exam Location:  Outpatient   Procedure: 2D Echo   Indications:    pericardial effusion. TEVAR.    History:        Patient has prior history of Echocardiogram examinations,  most                 recent 09/16/2021. Aortic arch aneurysm repair,  Arrythmias:Atrial                 Fibrillation; Risk Factors:Dyslipidemia.    Sonographer:    Johny Chess RDCS  Referring Phys: [id number] Saprina Chuong K Admire Bunnell   IMPRESSIONS     1. EF challenging in afib. Left ventricular ejection fraction, by  estimation, is 40 to 45%. The left ventricle has mildly decreased   function. The left ventricle demonstrates global hypokinesis. There is  mild left ventricular hypertrophy. Left  ventricular diastolic parameters are indeterminate.   2. Right ventricular systolic function is normal. The right ventricular  size is normal. Tricuspid regurgitation signal is inadequate for assessing  PA pressure.   3. No evidence of mitral valve regurgitation.   4. The aortic valve is grossly normal. Aortic valve regurgitation is not  visualized.   5. 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   6. The inferior vena cava is normal in size with greater than 50%  respiratory variability, suggesting right atrial pressure of 3 mmHg.   Conclusion(s)/Recommendation(s): EF mildly declined compared to 09/11/2021  in the setting of afib.   FINDINGS   Left Ventricle: EF challenging in afib. Left ventricular ejection  fraction, by estimation, is 40 to 45%. The left ventricle has mildly  decreased function. The left ventricle demonstrates global hypokinesis.  The left ventricular internal cavity size was  normal in size. There is mild left ventricular hypertrophy. Left  ventricular diastolic parameters are indeterminate.   Right Ventricle: The right ventricular size is normal. Right ventricular  systolic function is normal. Tricuspid regurgitation signal is inadequate  for assessing PA pressure.   Left Atrium: Left atrial size was normal in size.   Right Atrium: Right atrial size was normal in size.   Pericardium: There is no evidence of pericardial effusion.   Mitral Valve: No evidence of mitral valve regurgitation.   Tricuspid Valve: Tricuspid valve regurgitation is not demonstrated.   Aortic Valve: The aortic valve is grossly normal. Aortic valve  regurgitation is not visualized.   Pulmonic Valve: Pulmonic valve regurgitation is not visualized.   Aorta: There is an aneurysm involving the aortic root measuring 40  mm.  There is an aneurysm involving the ascending aorta measuring 41 mm.   Venous: The inferior vena cava is normal in size with greater than 50%  respiratory variability, suggesting right atrial pressure of 3 mmHg.   IAS/Shunts: No atrial level shunt detected by color flow Doppler.     LEFT VENTRICLE  PLAX 2D  LVIDd:         5.50 cm  LVIDs:         4.50 cm  LV PW:         1.20 cm  LV IVS:        1.00 cm  LVOT diam:     2.30 cm  LVOT Area:     4.15 cm     RIGHT VENTRICLE          IVC  RV Basal diam:  2.50 cm  IVC diam: 1.60 cm   LEFT ATRIUM             Index        RIGHT ATRIUM           Index  LA diam:        4.20 cm 2.11 cm/m   RA Area:     14.80 cm  LA Vol (A2C):   67.8 ml 34.00 ml/m  RA Volume:   29.00 ml  14.54 ml/m  LA Vol (A4C):   50.5 ml 25.33 ml/m  LA Biplane Vol: 60.2 ml 30.19 ml/m     AORTA  Ao Root diam: 4.00 cm  Ao Asc diam:  4.10 cm     SHUNTS  Systemic Diam: 2.30 cm   Phineas Inches  Electronically signed by Phineas Inches  Signature Date/Time: 01/31/2022/2:36:39 PM        Final     Narrative & Impression  CLINICAL DATA:  Status post aortic debranching   EXAM: CHEST - 2 VIEW   COMPARISON:  Chest x-ray 01/18/2022   FINDINGS: Cardiomediastinal silhouette is stable. Aortic graft is present. Heart is mildly enlarged. The lungs are clear. There is no pleural effusion or pneumothorax. Sternotomy wires are present.   IMPRESSION: 1. No active cardiopulmonary disease. 2. Mild cardiomegaly.     Electronically Signed   By: Ronney Asters M.D.   On: 01/29/2022 15:30     CBC    Component Value Date/Time   WBC 24.2 (H) 01/31/2022 1405   RBC 4.04 (L) 01/31/2022 1405   HGB 12.1 (L) 01/31/2022 1405   HGB 14.6 08/26/2021 0943   HCT 37.4 (L) 01/31/2022 1405   PLT 232 01/31/2022 1405   PLT 146 (L) 08/26/2021 0943   MCV 92.6 01/31/2022 1405   MCH 30.0 01/31/2022 1405   MCHC 32.4 01/31/2022 1405   RDW 13.6 01/31/2022 1405   LYMPHSABS 16.9 (H)  01/31/2022 1405   MONOABS 1.2 (H) 01/31/2022 1405   EOSABS 0.5 01/31/2022 1405   BASOSABS 0.2 (H) 01/31/2022 1405   BMET    Component Value Date/Time   NA 135 01/31/2022 1405   K 4.0 01/31/2022 1405   CL 101 01/31/2022 1405   CO2 26 01/31/2022 1405   GLUCOSE 133 (H) 01/31/2022 1405   BUN 13 01/31/2022 1405   CREATININE 1.16 01/31/2022 1405   CREATININE 1.04 08/26/2021 0943   CREATININE 1.03 08/15/2021 0000   CALCIUM 8.8 (L) 01/31/2022 1405   EGFR 75 08/15/2021 0000   GFRNONAA >60 01/31/2022 1405   GFRNONAA >60 08/26/2021 0943   GFRNONAA 84 03/30/2019 1603    Impression:  I do not see any clear-cut reason for his generalized weakness and poor appetite.  His 2D echocardiogram looks unremarkable with no sign of pericardial effusion and good LV function.  His chest x-ray shows clear lung fields and no pleural effusions.  His electrolytes are normal with a normal creatinine.  His hemoglobin is stable at 12.1.  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  He is still on amiodarone and that may be the cause of his decreased appetite and generalized weakness.  I asked him to discontinue it to see how he feels.  I would expect him to feel better within a few days if it is the amiodarone.  I also think is worthwhile checking his thyroid function to be sure that is within normal limits.  I discussed all this with him and his wife and answered their questions.  Plan:  We will have a free T4 and TSH level drawn and I will plan to see him back in the office next Wednesday.  He already took his amiodarone this morning but will discontinue it starting this evening.   [name], MD [facility] [phone]

## 2022-02-04 LAB — PATHOLOGIST SMEAR REVIEW: Path Review: REACTIVE

## 2022-02-05 ENCOUNTER — Other Ambulatory Visit: Payer: Self-pay | Admitting: Surgery

## 2022-02-05 ENCOUNTER — Ambulatory Visit: Payer: Medicare Other | Admitting: Surgery

## 2022-02-05 DIAGNOSIS — I7122 Aneurysm of the aortic arch, without rupture: Secondary | ICD-10-CM

## 2022-02-05 DIAGNOSIS — I7121 Aneurysm of the ascending aorta, without rupture: Secondary | ICD-10-CM | POA: Diagnosis not present

## 2022-02-06 ENCOUNTER — Ambulatory Visit: Payer: Medicare Other | Admitting: Surgery

## 2022-02-06 LAB — TSH+FREE T4: TSH W/REFLEX TO FT4: 4.11 mIU/L (ref 0.40–4.50)

## 2022-02-11 ENCOUNTER — Other Ambulatory Visit: Payer: Self-pay | Admitting: Surgery

## 2022-02-11 DIAGNOSIS — I7122 Aneurysm of the aortic arch, without rupture: Secondary | ICD-10-CM

## 2022-02-12 ENCOUNTER — Ambulatory Visit
Admission: RE | Admit: 2022-02-12 | Discharge: 2022-02-12 | Disposition: A | Discharge status: Home / Self Care | Payer: Medicare Other | Source: Ambulatory Visit | Attending: Surgery | Admitting: Surgery

## 2022-02-12 ENCOUNTER — Ambulatory Visit (INDEPENDENT_AMBULATORY_CARE_PROVIDER_SITE_OTHER): Payer: Self-pay | Admitting: Surgery

## 2022-02-12 ENCOUNTER — Encounter: Payer: Self-pay | Admitting: Surgery

## 2022-02-12 ENCOUNTER — Telehealth: Payer: Self-pay | Admitting: Physician Assistant

## 2022-02-12 VITALS — BP 126/79 | HR 72 | Resp 20 | Ht 70.0 in | Wt 181.0 lb

## 2022-02-12 DIAGNOSIS — I7121 Aneurysm of the ascending aorta, without rupture: Secondary | ICD-10-CM

## 2022-02-12 DIAGNOSIS — Z8679 Personal history of other diseases of the circulatory system: Secondary | ICD-10-CM

## 2022-02-12 DIAGNOSIS — Z09 Encounter for follow-up examination after completed treatment for conditions other than malignant neoplasm: Secondary | ICD-10-CM

## 2022-02-12 DIAGNOSIS — I7122 Aneurysm of the aortic arch, without rupture: Secondary | ICD-10-CM

## 2022-02-12 DIAGNOSIS — I712 Thoracic aortic aneurysm, without rupture, unspecified: Secondary | ICD-10-CM | POA: Diagnosis not present

## 2022-02-12 DIAGNOSIS — Z95828 Presence of other vascular implants and grafts: Secondary | ICD-10-CM | POA: Diagnosis not present

## 2022-02-12 DIAGNOSIS — Z9889 Other specified postprocedural states: Secondary | ICD-10-CM

## 2022-02-12 NOTE — Telephone Encounter (Signed)
Referral to be attached to appt.

## 2022-02-12 NOTE — Progress Notes (Signed)
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 He had undergone a right carotid-subclavian transposition by Dr. Trula Slade preoperatively in preparation for the surgery. 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 His postoperative course was somewhat slow due to his age and general deconditioning. He did go into atrial fibrillation and was started on amiodarone and Lopressor with conversion to sinus rhythm. He was also started on Eliquis due to recurrence of his atrial fibrillation. He had some bradycardia in the 50s which limited up titration of his beta-blocker. He also had right bundle branch block on EKG.   I saw him in the office on 01/31/2022 and he was reporting generalized weakness and poor appetite.  He also reported some shortness of breath although it was not every day.  He was also still having significant chest wall discomfort making it difficult to sleep.  I discontinued his Lopressor and amiodarone.  He said that since then he has been feeling better.  He is ambulating daily without chest pain or shortness of breath.  He said that he has been carrying buckets of bird seed around his yard to feel various bird feeders and these way about 15 pounds each.  He has been carrying 1 in each arm and feels no soreness in his chest.  He has continued to have some hoarse voice and feels a little short of breath at times.  His appetite is still down.  Current Outpatient Medications  Medication Sig Dispense Refill   apixaban (ELIQUIS) 5 MG TABS tablet Take 1 tablet (5 mg total) by mouth 2 (two) times daily. 60 tablet 1   aspirin EC 81 MG tablet Take 1 tablet (81 mg total) by mouth daily at 6 (six) AM.  Swallow whole. 30 tablet 12   Budeson-Glycopyrrol-Formoterol (BREZTRI AEROSPHERE) 160-9-4.8 MCG/ACT AERO Inhale 2 puffs into the lungs daily.     Cranberry-Vitamin C-Probiotic (AZO CRANBERRY PO) Take 1 each by mouth in the morning.     Cyanocobalamin (VITAMIN B 12 PO) Take 1 tablet by mouth in the morning.     Multiple Vitamins-Minerals (MULTIVITAMIN GUMMIES ADULTS PO) Take 2 tablets by mouth in the morning.     tamsulosin (FLOMAX) 0.4 MG CAPS capsule Take 1 capsule (0.4 mg total) by mouth in the morning. 90 capsule 3   amiodarone (PACERONE) 200 MG tablet Take 1 tablet (200 mg total) by mouth 2 (two) times daily. 60 tablet 1   oxycodone (OXY-IR) 5 MG capsule Take 1 capsule (5 mg total) by mouth every 6 (six) hours as needed. 20 capsule 0   No current facility-administered medications for this visit.     Physical Exam: BP 126/79   Pulse 72   Resp 20   Ht '5\' 10"'$  (1.778 m)   Wt 181 lb (82.1 kg)   SpO2 96%   BMI 25.97 kg/m  He looks well. Cardiac exam shows a regular rate and rhythm with normal heart sounds.  There is no murmur. Lungs are clear. Chest incision is well-healed and the sternum is stable. There is no peripheral edema.  Diagnostic Tests:  Narrative & Impression  CLINICAL DATA:  Aortic aneurysm   EXAM: CHEST - 2 VIEW   COMPARISON:  01/29/2022   FINDINGS: Stable  cardiomediastinal contours with thoracic aortic stent graft in place. Prior sternotomy. Both lungs are clear. No pleural effusion or pneumothorax.   IMPRESSION: Stable chest radiograph.  No active cardiopulmonary disease.     Electronically Signed   By: Davina Poke D.O.   On: 02/12/2022 09:30    TSH level on 02/05/2022 was 4.11 which is within the normal limits.  Hemoglobin on 01/31/2022 was 12.1.  White blood cell count was 24.2 which is chronically elevated due to his CLL.  Electrolytes and renal function are within normal limits.  Impression:  Overall I think he is doing well 1 month  following his surgery.  He seems to be maintaining sinus rhythm off amiodarone and Lopressor.  I would plan to continue his Eliquis for 3 months to be sure that he is maintaining sinus rhythm before stopping it.  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  Plan:  He is going to schedule follow-up appointment with Dr. Trula Slade.   [name], MD [facility] [phone]

## 2022-02-12 NOTE — Telephone Encounter (Signed)
-----   Message from Gabriel Earing, [place] sent at 12/18/2021 11:06 AM EST ----- S/p right subclavian carotid bypass 12/13.  Per Dr. Trula Slade, please add this to his schedule DR. BRABHAM ONLY on 12/28 between vein cases to discuss upcoming surgery.  Thanks

## 2022-02-17 ENCOUNTER — Encounter: Payer: Self-pay | Admitting: Physician Assistant

## 2022-03-03 ENCOUNTER — Ambulatory Visit (INDEPENDENT_AMBULATORY_CARE_PROVIDER_SITE_OTHER): Payer: Medicare Other | Admitting: Surgery

## 2022-03-03 ENCOUNTER — Encounter: Payer: Self-pay | Admitting: Surgery

## 2022-03-03 VITALS — BP 119/75 | HR 60 | Temp 98.0°F | Resp 20 | Ht 70.0 in | Wt 179.9 lb

## 2022-03-03 DIAGNOSIS — I7122 Aneurysm of the aortic arch, without rupture: Secondary | ICD-10-CM

## 2022-03-03 NOTE — Progress Notes (Signed)
Patient name: Jason Abbott. MRN: [id number] DOB: 09/08/1945 Sex: male  REASON FOR VISIT:     Post op  HISTORY OF PRESENT ILLNESS:   Jason Schmit. is a 77 y.o. 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  This was done for a 6 cm aneurysm.  He states several extra days in the hospital secondary to deconditioning.  He was started on Eliquis secondary to atrial fibrillation.  He is now becoming more active, getting outside and feeding the birds.  He is a little concerned about his lack of appetite and taste, but this is getting better.  He also feels that his hoarseness is improving   The patient suffers from CLL which has been stable.  He has a history of stroke in the past with no residual deficits.  He is a former smoker.  CURRENT MEDICATIONS:    Current Outpatient Medications  Medication Sig Dispense Refill   apixaban (ELIQUIS) 5 MG TABS tablet Take 1 tablet (5 mg total) by mouth 2 (two) times daily. 60 tablet 1   aspirin EC 81 MG tablet Take 1 tablet (81 mg total) by mouth daily at 6 (six) AM. Swallow whole. 30 tablet 12   Budeson-Glycopyrrol-Formoterol (BREZTRI AEROSPHERE) 160-9-4.8 MCG/ACT AERO Inhale 2 puffs into the lungs daily.     Cranberry-Vitamin C-Probiotic (AZO CRANBERRY PO) Take 1 each by mouth in the morning.     Cyanocobalamin (VITAMIN B 12 PO) Take 1 tablet by mouth in the morning.     magnesium oxide (MAG-OX) 400 MG tablet Take 400 mg by mouth daily.     Multiple Vitamins-Minerals (MULTIVITAMIN GUMMIES ADULTS PO) Take 2 tablets by mouth in the morning.     tamsulosin (FLOMAX) 0.4 MG CAPS capsule Take 1 capsule (0.4 mg total) by mouth in the morning. 90 capsule 3   No current facility-administered medications for this visit.    REVIEW OF SYSTEMS:   '[X]'$  denotes positive finding, '[ ]'$   denotes negative finding Cardiac  Comments:  Chest pain or chest pressure:    Shortness of breath upon exertion:    Short of breath when lying flat:    Irregular heart rhythm:    Constitutional    Fever or chills:      PHYSICAL EXAM:   Vitals:   03/03/22 1431  BP: 132/74  Pulse: 60  Resp: 20  Temp: 98 F (36.7 C)  SpO2: 95%  Weight: 179 lb 14.4 oz (81.6 kg)  Height: '5\' 10"'$  (1.778 m)    GENERAL: The patient is a well-nourished male, in no acute distress. The vital signs are documented above. CARDIOVASCULAR: There is a regular rate and rhythm. PULMONARY: Non-labored respirations Incisions healed Remains hoarse Palpable radial pulses  STUDIES:   Chest x-ray: No significant pleural effusions   MEDICAL ISSUES:   Aortic arch aneurysm: The patient has done well following aortic arch reconstruction and thoracic endovascular repair.  I have reassured him that his appetite and sense of taste should improve with time as it is starting to do so already.  I have told him that he should not lift anything over 10 pounds for a total of 3 months.  From my perspective he can travel.  With regards to his hoarseness.  I suspect this is a recurrent nerve issue from his original right carotid subclavian transposition.  This has slowly improved.  I would  like to wait another 3 to 4 months before considering sending him to ENT.  I have him scheduled for follow-up with me in 3 months with a CT angiogram to evaluate his repair.  [name], MD, FACS [facility] [phone] Pager [phone]

## 2022-03-13 ENCOUNTER — Encounter: Payer: Self-pay | Admitting: Surgery

## 2022-03-13 ENCOUNTER — Other Ambulatory Visit: Payer: Self-pay

## 2022-03-13 MED ORDER — APIXABAN 5 MG PO TABS: 5.0000 mg | ORAL_TABLET | Freq: Two times a day (BID) | ORAL | 1 refills | Status: DC

## 2022-04-08 ENCOUNTER — Encounter: Payer: Self-pay | Admitting: Sports Medicine

## 2022-04-08 ENCOUNTER — Ambulatory Visit
Admission: RE | Admit: 2022-04-08 | Discharge: 2022-04-08 | Disposition: A | Discharge status: Home / Self Care | Payer: Medicare Other | Source: Ambulatory Visit | Attending: Family Medicine | Admitting: Family Medicine

## 2022-04-08 VITALS — BP 133/78 | HR 72 | Temp 98.2°F | Resp 16 | Ht 70.5 in | Wt 180.0 lb

## 2022-04-08 DIAGNOSIS — N3001 Acute cystitis with hematuria: Secondary | ICD-10-CM

## 2022-04-08 LAB — POCT URINALYSIS DIP (MANUAL ENTRY)
Bilirubin, UA: NEGATIVE
Glucose, UA: NEGATIVE mg/dL
Ketones, POC UA: NEGATIVE mg/dL
Nitrite, UA: NEGATIVE
Protein Ur, POC: 100 mg/dL — AB
Spec Grav, UA: 1.025 (ref 1.010–1.025)
Urobilinogen, UA: 0.2 E.U./dL
pH, UA: 5.5 (ref 5.0–8.0)

## 2022-04-08 MED ORDER — NITROFURANTOIN MONOHYD MACRO 100 MG PO CAPS: 100.0000 mg | ORAL_CAPSULE | Freq: Two times a day (BID) | ORAL | 0 refills | Status: DC

## 2022-04-08 NOTE — ED Triage Notes (Signed)
Patient c/o urinary urgency, frequency, no dysuria or hematuria x 2 days.  Patient does have a history of UTI's.  Denies any OTC meds.

## 2022-04-08 NOTE — Discharge Instructions (Addendum)
Instructed patient to take medication as directed with food to completion.  Encouraged to increase daily water intake while taking this medication.  Advised we will follow-up with urine culture results once received.  Advised if symptoms worsen and/or unresolved please follow-up with PCP or here for further evaluation.

## 2022-04-08 NOTE — ED Provider Notes (Signed)
Jason Abbott CARE    CSN: [id number] Arrival date & time: 04/08/22  1509      History   Chief Complaint Chief Complaint  Patient presents with   Urinary Frequency    I most likely have a Urinary Tract Infection. I have a history of getting these. I never have burning or pain. Just have to urinate constantly and very little comes out. - Entered by patient    HPI Jason Abbott. is a 77 y.o. male.   HPI Pleasant 77 year old male presents with urinary urgency and frequency with hematuria for 2 days.  Reports history of urinary tract infections.  PMH significant for stroke, aortic arch aneurysm, and bowel obstruction.  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   Urinary tract infection 01/03/2022   Atherosclerosis of aortic arch 10/17/2021   Hyponatremia 09/13/2021   Hypoalbuminemia 09/13/2021   BPH (benign prostatic hyperplasia) 09/13/2021   Leg cramping 09/13/2021   Acute respiratory failure with hypoxia 09/09/2021   Thrombocytopenia 09/09/2021   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   Ingrown right greater toenail 08/12/2021   Chronic headaches 08/12/2021   Right hand pain 03/11/2021   Tinnitus 03/11/2021   Elevated TSH 09/12/2020   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   Community acquired bilateral lower lobe pneumonia 03/16/2019   CLL (chronic lymphocytic leukemia) 02/23/2019   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   History of  stroke involving cerebellum 01/27/2019   Lumbar spinal stenosis 12/21/2018   Numbness and tingling in both hands 10/20/2018   Hyperlipidemia, mixed 06/01/2018   Seborrheic keratosis 01/12/2018   Combined forms of age-related cataract of left eye 03/23/2014   Regular astigmatism of left eye 03/23/2014   Dyspepsia 02/23/2014   Right shoulder pain 10/03/2013    Past Surgical History:  Procedure Laterality Date   AORTIC ARCH DEBRANCHING N/A 01/10/2022   Procedure: AORTIC ARCH DEBRANCHING WITH 16 X 8 X 10 MM HEMASHIELD GOLD GRAFT;  Surgeon: [name], MD;  Location: [facility];  Service: Open Heart Surgery;  Laterality: N/A;   CAROTID-SUBCLAVIAN BYPASS GRAFT Right 12/18/2021   Procedure: RIGHT BYPASS GRAFT CAROTID-SUBCLAVIAN;  Surgeon: Serafina Mitchell, MD;  Location: [facility];  Service: Vascular;  Laterality: Right;   CARPAL TUNNEL RELEASE Right 11/25/2021   Procedure: CARPAL TUNNEL RELEASE;  Surgeon: Sherilyn Cooter, MD;  Location: [place];  Service: Orthopedics;  Laterality: Right;  or MAC with regional block 60   HERNIA REPAIR     MECKEL DIVERTICULUM EXCISION     ORIF FINGER / THUMB FRACTURE     TEE WITHOUT CARDIOVERSION N/A 01/10/2022   Procedure: TRANSESOPHAGEAL ECHOCARDIOGRAM (TEE);  Surgeon: [name], MD;  Location: [place];  Service: Open Heart Surgery;  Laterality: N/A;   TENOSYNOVECTOMY Right 11/25/2021   Procedure: FLEXOR TENOSYNOVECTOMY;  Surgeon: Sherilyn Cooter, MD;  Location: [place];  Service: Orthopedics;  Laterality: Right;  or  MAC with regional block 60   THORACIC AORTIC ENDOVASCULAR STENT GRAFT N/A 01/10/2022   Procedure: THORACIC AORTIC ENDOVASCULAR STENT GRAFT;  Surgeon: Serafina Mitchell, MD;  [facility];  Service: Vascular;  Laterality: N/A;   ulna nerve surgery         Home Medications    Prior to Admission medications   Medication Sig Start Date End Date Taking? Authorizing Provider  apixaban (ELIQUIS) 5 MG TABS tablet Take 1 tablet (5 mg total) by mouth  2 (two) times daily. 03/13/22  Yes [name], MD  aspirin EC 81 MG tablet Take 1 tablet (81 mg total) by mouth daily at 6 (six) AM. Swallow whole. 12/20/21  Yes Schuh, McKenzi P, PA-C  Budeson-Glycopyrrol-Formoterol (BREZTRI AEROSPHERE) 160-9-4.8 MCG/ACT AERO Inhale 2 puffs into the lungs daily. 01/21/22  Yes Gold, Wayne E, PA-C  Cranberry-Vitamin C-Probiotic (AZO CRANBERRY PO) Take 1 each by mouth in the morning.   Yes [provider]  Cyanocobalamin (VITAMIN B 12 PO) Take 1 tablet by mouth in the morning.   Yes [provider]  magnesium oxide (MAG-OX) 400 MG tablet Take 400 mg by mouth daily. 10/14/20  Yes [provider]  Multiple Vitamins-Minerals (MULTIVITAMIN GUMMIES ADULTS PO) Take 2 tablets by mouth in the morning.   Yes [provider]  nitrofurantoin, macrocrystal-monohydrate, (MACROBID) 100 MG capsule Take 1 capsule (100 mg total) by mouth 2 (two) times daily for 7 days. 04/08/22 04/15/22 Yes Eliezer Lofts, FNP  tamsulosin (FLOMAX) 0.4 MG CAPS capsule Take 1 capsule (0.4 mg total) by mouth in the morning. 01/29/22  Yes Silverio Decamp, MD    Family History Family History  Problem Relation Age of Onset   Cancer Mother        pancreatic   COPD Father    Diabetes Sister    Diabetes Brother    Diabetes Brother     Social History Social History   Tobacco Use   Smoking status: Former    Packs/day: 0.50    Years: 23.00    Additional pack years: 0.00    Total pack years: 11.50    Types: Cigarettes    Quit date: 1989    Years since quitting: 35.2   Smokeless tobacco: Never  Vaping Use   Vaping Use: Never used  Substance Use Topics   Alcohol use: Not Currently    Comment: rarely   Drug use: No     Allergies   Levaquin [levofloxacin]   Review of Systems Review of Systems  Genitourinary:  Positive for frequency.  All other systems reviewed and are negative.    Physical Exam Triage Vital Signs ED Triage Vitals  Enc  Vitals Group     BP 04/08/22 1516 133/78     Pulse Rate 04/08/22 1516 72     Resp 04/08/22 1516 16     Temp 04/08/22 1516 98.2 F (36.8 C)     Temp Source 04/08/22 1516 Oral     SpO2 04/08/22 1516 97 %     Weight 04/08/22 1517 180 lb (81.6 kg)     Height 04/08/22 1517 5' 10.5" (1.791 m)     Head Circumference --      Peak Flow --      Pain Score 04/08/22 1517 0     Pain Loc --      Pain Edu? --      Excl. in [place]? --    No data found.  Updated Vital Signs BP  133/78 (BP Location: Right Arm)   Pulse 72   Temp 98.2 F (36.8 C) (Oral)   Resp 16   Ht 5' 10.5" (1.791 m)   Wt 180 lb (81.6 kg)   SpO2 97%   BMI 25.46 kg/m      Physical Exam Vitals and nursing note reviewed.  Constitutional:      Appearance: Normal appearance. He is normal weight.  HENT:     Head: Normocephalic and atraumatic.     Mouth/Throat:     Mouth: Mucous membranes are moist.     Pharynx: Oropharynx is clear.  Eyes:     Extraocular Movements: Extraocular movements intact.     Conjunctiva/sclera: Conjunctivae normal.     Pupils: Pupils are equal, round, and reactive to light.  Cardiovascular:     Rate and Rhythm: Normal rate and regular rhythm.     Pulses: Normal pulses.     Heart sounds: Normal heart sounds. No murmur heard. Pulmonary:     Effort: Pulmonary effort is normal.     Breath sounds: Normal breath sounds. No wheezing, rhonchi or rales.  Abdominal:     Tenderness: There is no right CVA tenderness or left CVA tenderness.  Musculoskeletal:     Cervical back: Normal range of motion and neck supple.  Skin:    General: Skin is warm and dry.  Neurological:     General: No focal deficit present.     Mental Status: He is alert and oriented to person, place, and time.      UC Treatments / Results  Labs (all labs ordered are listed, but only abnormal results are displayed) Labs Reviewed  POCT URINALYSIS DIP (MANUAL ENTRY) - Abnormal; Notable for the following components:      Result  Value   Color, UA other (*)    Clarity, UA cloudy (*)    Blood, UA small (*)    Protein Ur, POC =100 (*)    Leukocytes, UA Small (1+) (*)    All other components within normal limits  URINE CULTURE    EKG   Radiology No results found.  Procedures Procedures (including critical care time)  Medications Ordered in UC Medications - No data to display  Initial Impression / Assessment and Plan / UC Course  I have reviewed the triage vital signs and the nursing notes.  Pertinent labs & imaging results that were available during my care of the patient were reviewed by me and considered in my medical decision making (see chart for details).     MDM: 1.  Acute cystitis with hematuria-Rx'd Macrobid 100 mg twice daily x 7 days.  UA reveals above, urine culture ordered. Instructed patient to take medication as directed with food to completion.  Encouraged to increase daily water intake while taking this medication.  Advised we will follow-up with urine culture results once received.  Advised if symptoms worsen and/or unresolved please follow-up with PCP or here for further evaluation.  Patient discharged home, hemodynamically stable. Final Clinical Impressions(s) / UC Diagnoses   Final diagnoses:  Acute cystitis with hematuria     Discharge Instructions      Instructed patient to take medication as directed with food to completion.  Encouraged to increase daily water intake while taking this medication.  Advised we will follow-up with urine culture results once received.  Advised if symptoms worsen and/or unresolved please follow-up with PCP or here for further evaluation.     ED Prescriptions     Medication  Sig Dispense Auth. Provider   nitrofurantoin, macrocrystal-monohydrate, (MACROBID) 100 MG capsule Take 1 capsule (100 mg total) by mouth 2 (two) times daily for 7 days. 14 capsule Eliezer Lofts, FNP      PDMP not reviewed this encounter.   Eliezer Lofts, [place] 04/08/22  1623

## 2022-04-10 LAB — URINE CULTURE: Culture: >=100000 — AB

## 2022-04-15 ENCOUNTER — Ambulatory Visit
Admission: RE | Admit: 2022-04-15 | Discharge: 2022-04-15 | Disposition: A | Discharge status: Home / Self Care | Payer: Medicare Other | Source: Ambulatory Visit | Attending: Family Medicine | Admitting: Family Medicine

## 2022-04-15 VITALS — BP 133/81 | HR 66 | Temp 97.6°F | Resp 16

## 2022-04-15 DIAGNOSIS — N401 Enlarged prostate with lower urinary tract symptoms: Secondary | ICD-10-CM

## 2022-04-15 DIAGNOSIS — N390 Urinary tract infection, site not specified: Secondary | ICD-10-CM | POA: Diagnosis not present

## 2022-04-15 DIAGNOSIS — N138 Other obstructive and reflux uropathy: Secondary | ICD-10-CM | POA: Diagnosis not present

## 2022-04-15 LAB — POCT URINALYSIS DIP (MANUAL ENTRY)
Bilirubin, UA: NEGATIVE
Glucose, UA: NEGATIVE mg/dL
Ketones, POC UA: NEGATIVE mg/dL
Leukocytes, UA: NEGATIVE
Nitrite, UA: NEGATIVE
Protein Ur, POC: 30 mg/dL — AB
Spec Grav, UA: 1.025 (ref 1.010–1.025)
Urobilinogen, UA: 0.2 E.U./dL
pH, UA: 5.5 (ref 5.0–8.0)

## 2022-04-15 NOTE — ED Triage Notes (Signed)
Here for test for clearance from UTI he was treated for last week. States his original symptoms were urinary frequency and hesitancy, and believes that those symptoms have improved but not fully resolved.

## 2022-04-15 NOTE — ED Provider Notes (Signed)
Jason Abbott CARE    CSN: [id number] Arrival date & time: 04/15/22  1240      History   Chief Complaint Chief Complaint  Patient presents with   Follow-up    Per advice of Doctor at urgent care, Need to have urine tested to confirm if UTI is gone or not since finishing antibiotic course yesterday. I feel that I might possibly still have the UTI. - Entered by patient    HPI Jason Abbott. is a 77 y.o. male.   HPI Pleasant gentleman known to me from prior visits.  Is here for follow-up of urinary tract infection.  He seen here on 04/08/2022 for urinary tract infection.  He was given Macrobid twice daily for 7 days.  He had a urine culture performed that grew Enterococcus sensitive to nitrofurantoin.  Was told to have a repeat urinalysis following his treatment here or at his primary care doctor's office.  He is here stating that he feels back to normal.  No fever or chills.  No abdominal pain.  No dysuria.  He does still, however, have urinary frequency and difficulty emptying his bladder, nocturia.  He recognizes the symptoms are from his enlarged prostate.  I explained to him that he may need additional treatment to reduce his prostate to improve his symptoms and prevent recurring UTI.  This decision will come from his primary care doctor 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   Urinary tract infection 01/03/2022   Atherosclerosis of  aortic arch 10/17/2021   Hyponatremia 09/13/2021   Hypoalbuminemia 09/13/2021   BPH (benign prostatic hyperplasia) 09/13/2021   Leg cramping 09/13/2021   Acute respiratory failure with hypoxia 09/09/2021   Thrombocytopenia 09/09/2021   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   Ingrown right greater toenail 08/12/2021   Chronic headaches 08/12/2021   Right hand pain 03/11/2021   Tinnitus 03/11/2021   Elevated TSH 09/12/2020   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   Community acquired bilateral lower lobe pneumonia 03/16/2019   CLL (chronic lymphocytic leukemia) 02/23/2019   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/27/2019   Lumbar spinal stenosis 12/21/2018   Numbness and tingling in both hands 10/20/2018   Hyperlipidemia, mixed 06/01/2018   Seborrheic keratosis 01/12/2018   Combined forms of age-related cataract of left eye 03/23/2014   Regular astigmatism of left eye 03/23/2014   Dyspepsia 02/23/2014   Right shoulder pain 10/03/2013    Past Surgical History:  Procedure Laterality Date   AORTIC ARCH DEBRANCHING N/A 01/10/2022   Procedure: AORTIC ARCH DEBRANCHING WITH 16 X 8 X 10 MM HEMASHIELD GOLD GRAFT;  Surgeon: Alleen Borne, MD;  Location: [facility];  Service: Open Heart Surgery;  Laterality: N/A;   CAROTID-SUBCLAVIAN BYPASS GRAFT Right 12/18/2021   Procedure: RIGHT BYPASS GRAFT CAROTID-SUBCLAVIAN;  Surgeon: Nada Libman, MD;  Location: [facility];  Service:  Vascular;  Laterality: Right;   CARPAL TUNNEL RELEASE Right 11/25/2021   Procedure: CARPAL TUNNEL RELEASE;  Surgeon: Marlyne Beards, MD;  Location: [facility];  Service: Orthopedics;  Laterality: Right;  or MAC with regional block 60   HERNIA REPAIR     MECKEL DIVERTICULUM EXCISION     ORIF FINGER / THUMB FRACTURE     TEE WITHOUT CARDIOVERSION N/A 01/10/2022   Procedure: TRANSESOPHAGEAL ECHOCARDIOGRAM (TEE);  Surgeon: Alleen Borne, MD;  Location[facility];  Service: Open Heart Surgery;  Laterality:  N/A;   TENOSYNOVECTOMY Right 11/25/2021   Procedure: FLEXOR TENOSYNOVECTOMY;  Surgeon: Marlyne Beards, MD;  Location: [facility];  Service: Orthopedics;  Laterality: Right;  or MAC with regional block 60   THORACIC AORTIC ENDOVASCULAR STENT GRAFT N/A 01/10/2022   Procedure: THORACIC AORTIC ENDOVASCULAR STENT GRAFT;  Surgeon: Nada Libman, MD;  Location[facility];  Service: Vascular;  Laterality: N/A;   ulna nerve surgery         Home Medications    Prior to Admission medications   Medication Sig Start Date End Date Taking? Authorizing Provider  apixaban (ELIQUIS) 5 MG TABS tablet Take 1 tablet (5 mg total) by mouth 2 (two) times daily. 03/13/22   Alleen Borne, MD  aspirin EC 81 MG tablet Take 1 tablet (81 mg total) by mouth daily at 6 (six) AM. Swallow whole. 12/20/21   Schuh, McKenzi P, PA-C  Budeson-Glycopyrrol-Formoterol (BREZTRI AEROSPHERE) 160-9-4.8 MCG/ACT AERO Inhale 2 puffs into the lungs daily. 01/21/22   Gold, Wayne E, PA-C  Cranberry-Vitamin C-Probiotic (AZO CRANBERRY PO) Take 1 each by mouth in the morning.    [provider]  Cyanocobalamin (VITAMIN B 12 PO) Take 1 tablet by mouth in the morning.    [provider]  magnesium oxide (MAG-OX) 400 MG tablet Take 400 mg by mouth daily. 10/14/20   [provider]  Multiple Vitamins-Minerals (MULTIVITAMIN GUMMIES ADULTS PO) Take 2 tablets by mouth in the morning.    [provider]  tamsulosin (FLOMAX) 0.4 MG CAPS capsule Take 1 capsule (0.4 mg total) by mouth in the morning. 01/29/22   Monica Becton, MD    Family History Family History  Problem Relation Age of Onset   Cancer Mother        pancreatic   COPD Father    Diabetes Sister    Diabetes Brother    Diabetes Brother     Social History Social History   Tobacco Use   Smoking status: Former    Packs/day: 0.50    Years: 23.00    Additional pack years: 0.00    Total pack years: 11.50    Types: Cigarettes    Quit date: 1989     Years since quitting: 35.2   Smokeless tobacco: Never  Vaping Use   Vaping Use: Never used  Substance Use Topics   Alcohol use: Not Currently    Comment: rarely   Drug use: No     Allergies   Levaquin [levofloxacin]   Review of Systems Review of Systems See HPI  Physical Exam Triage Vital Signs ED Triage Vitals  Enc Vitals Group     BP 04/15/22 1251 133/81     Pulse Rate 04/15/22 1251 66     Resp 04/15/22 1251 16     Temp 04/15/22 1251 97.6 F (36.4 C)     Temp Source 04/15/22 1251 Oral     SpO2 04/15/22 1251 100 %     Weight --  Height --      Head Circumference --      Peak Flow --      Pain Score 04/15/22 1252 0     Pain Loc --      Pain Edu? --      Excl. in [place]? --    No data found.  Updated Vital Signs BP 133/81 (BP Location: Right Arm)   Pulse 66   Temp 97.6 F (36.4 C) (Oral)   Resp 16   SpO2 100%      Physical Exam Constitutional:      General: He is not in acute distress.    Appearance: He is well-developed. He is ill-appearing.     Comments: Appears chronically ill  HENT:     Head: Normocephalic and atraumatic.  Eyes:     Conjunctiva/sclera: Conjunctivae normal.     Pupils: Pupils are equal, round, and reactive to light.  Cardiovascular:     Rate and Rhythm: Normal rate.  Pulmonary:     Effort: Pulmonary effort is normal. No respiratory distress.  Abdominal:     General: There is no distension.     Palpations: Abdomen is soft.     Tenderness: There is no abdominal tenderness. There is no right CVA tenderness or left CVA tenderness.  Musculoskeletal:        General: Normal range of motion.     Cervical back: Normal range of motion.  Skin:    General: Skin is warm and dry.  Neurological:     Mental Status: He is alert.      UC Treatments / Results  Labs (all labs ordered are listed, but only abnormal results are displayed) Labs Reviewed  POCT URINALYSIS DIP (MANUAL ENTRY) - Abnormal; Notable for the following  components:      Result Value   Blood, UA small (*)    Protein Ur, POC =30 (*)    All other components within normal limits    EKG   Radiology No results found.  Procedures Procedures (including critical care time)  Medications Ordered in UC Medications - No data to display  Initial Impression / Assessment and Plan / UC Course  I have reviewed the triage vital signs and the nursing notes.  Pertinent labs & imaging results that were available during my care of the patient were reviewed by me and considered in my medical decision making (see chart for details).    Urinalysis still has trace hematuria.  Will do a repeat culture to make certain the infection is eliminated.  He will contact his PCP about additional prostate treatment Final Clinical Impressions(s) / UC Diagnoses   Final diagnoses:  BPH with obstruction/lower urinary tract symptoms  Lower urinary tract infection     Discharge Instructions      We have sent your urine sample for culture.  You will be called if any bacteria are identified, and you need antibiotics  Make sure you continue to drink lots of water  I am contacting Dr. Benjamin Stainhekkekandam about making adjustments in your prostate medication.  His office will call you   ED Prescriptions   None    PDMP not reviewed this encounter.   Eustace MooreNelson, Corran Lalone Sue, MD 04/15/22 1341

## 2022-04-15 NOTE — Discharge Instructions (Signed)
We have sent your urine sample for culture.  You will be called if any bacteria are identified, and you need antibiotics  Make sure you continue to drink lots of water  I am contacting Dr. Benjamin Stain about making adjustments in your prostate medication.  His office will call you

## 2022-04-16 LAB — URINE CULTURE: Culture: NO GROWTH

## 2022-04-25 ENCOUNTER — Telehealth: Payer: Self-pay

## 2022-04-28 ENCOUNTER — Telehealth: Payer: Self-pay | Admitting: Sports Medicine

## 2022-04-28 NOTE — Telephone Encounter (Signed)
Contacted [name]. to schedule their annual wellness visit. Appointment made for 05/14/2022 at [facility].  Cira Servant Patient Access Advocate II Direct Dial: [phone]

## 2022-04-29 NOTE — Telephone Encounter (Signed)
Caller: Pt's wife, Boneta Lucks  Concern: swelling in feet & ankles, tingling sensation  Location: BLE  Description:  x 3 wks  Treatments:  Instructed to properly elevate feet  Resolution:  speak with Jetta to move CT & appt earlier, pt also wants ENT referral since no change in voice since surgery

## 2022-05-13 ENCOUNTER — Other Ambulatory Visit: Payer: Self-pay

## 2022-05-13 DIAGNOSIS — I7122 Aneurysm of the aortic arch, without rupture: Secondary | ICD-10-CM

## 2022-05-14 ENCOUNTER — Ambulatory Visit (INDEPENDENT_AMBULATORY_CARE_PROVIDER_SITE_OTHER): Payer: Medicare Other | Admitting: Family Medicine

## 2022-05-14 DIAGNOSIS — Z0000 Encounter for general adult medical examination without abnormal findings: Secondary | ICD-10-CM | POA: Diagnosis not present

## 2022-05-14 NOTE — Patient Instructions (Addendum)
MEDICARE ANNUAL WELLNESS VISIT Health Maintenance Summary and Written Plan of Care  Mr. Jason Abbott ,  Thank you for allowing me to perform your Medicare Annual Wellness Visit and for your ongoing commitment to your health.   Health Maintenance & Immunization History Health Maintenance  Topic Date Due   COVID-19 Vaccine (1) 05/30/2022 (Originally 02/22/1950)   Zoster Vaccines- Shingrix (1 of 2) 08/14/2022 (Originally 02/23/1964)   Pneumonia Vaccine 39+ Years old (2 of 2 - PCV) 05/14/2023 (Originally 01/18/2013)   INFLUENZA VACCINE  08/07/2022   Medicare Annual Wellness (AWV)  05/14/2023   DTaP/Tdap/Td (2 - Td or Tdap) 01/06/2025   Hepatitis C Screening  Completed   HPV VACCINES  Aged Out   COLONOSCOPY (Pts 45-19yrs Insurance coverage will need to be confirmed)  Discontinued   Immunization History  Administered Date(s) Administered   Influenza-Unspecified 10/25/2015   Pneumococcal Polysaccharide-23 01/19/2012   Tdap 01/07/2015    These are the patient goals that we discussed:  Goals Addressed               This Visit's Progress     Patient Stated (pt-stated)        Patient stated that he would like to be able to recover well from his recent surgeries.         This is a list of Health Maintenance Items that are overdue or due now:  Pneumococcal vaccine  Shingles vaccine  Patient declined the vaccines at this time.   Orders/Referrals Placed Today: No orders of the defined types were placed in this encounter.  (Contact our referral department at [phone] if you have not spoken with someone about your referral appointment within the next 5 days)    Follow-up Plan Follow-up with Monica Becton, MD as planned Medicare wellness visit in one year.  Patient will access AVS on my chart.      Health Maintenance, Male Adopting a healthy lifestyle and getting preventive care are important in promoting health and wellness. Ask your health care provider about: The  right schedule for you to have regular tests and exams. Things you can do on your own to prevent diseases and keep yourself healthy. What should I know about diet, weight, and exercise? Eat a healthy diet  Eat a diet that includes plenty of vegetables, fruits, low-fat dairy products, and lean protein. Do not eat a lot of foods that are high in solid fats, added sugars, or sodium. Maintain a healthy weight Body mass index (BMI) is a measurement that can be used to identify possible weight problems. It estimates body fat based on height and weight. Your health care provider can help determine your BMI and help you achieve or maintain a healthy weight. Get regular exercise Get regular exercise. This is one of the most important things you can do for your health. Most adults should: Exercise for at least 150 minutes each week. The exercise should increase your heart rate and make you sweat (moderate-intensity exercise). Do strengthening exercises at least twice a week. This is in addition to the moderate-intensity exercise. Spend less time sitting. Even light physical activity can be beneficial. Watch cholesterol and blood lipids Have your blood tested for lipids and cholesterol at 78 years of age, then have this test every 5 years. You may need to have your cholesterol levels checked more often if: Your lipid or cholesterol levels are high. You are older than 78 years of age. You are at high risk for heart disease. What should  I know about cancer screening? Many types of cancers can be detected early and may often be prevented. Depending on your health history and family history, you may need to have cancer screening at various ages. This may include screening for: Colorectal cancer. Prostate cancer. Skin cancer. Lung cancer. What should I know about heart disease, diabetes, and high blood pressure? Blood pressure and heart disease High blood pressure causes heart disease and increases the  risk of stroke. This is more likely to develop in people who have high blood pressure readings or are overweight. Talk with your health care provider about your target blood pressure readings. Have your blood pressure checked: Every 3-5 years if you are 6-39 years of age. Every year if you are 67 years old or older. If you are between the ages of 70 and 2 and are a current or former smoker, ask your health care provider if you should have a one-time screening for abdominal aortic aneurysm (AAA). Diabetes Have regular diabetes screenings. This checks your fasting blood sugar level. Have the screening done: Once every three years after age 58 if you are at a normal weight and have a low risk for diabetes. More often and at a younger age if you are overweight or have a high risk for diabetes. What should I know about preventing infection? Hepatitis B If you have a higher risk for hepatitis B, you should be screened for this virus. Talk with your health care provider to find out if you are at risk for hepatitis B infection. Hepatitis C Blood testing is recommended for: Everyone born from [birth year] through 1966. Anyone with known risk factors for hepatitis C. Sexually transmitted infections (STIs) You should be screened each year for STIs, including gonorrhea and chlamydia, if: You are sexually active and are younger than 78 years of age. You are older than 78 years of age and your health care provider tells you that you are at risk for this type of infection. Your sexual activity has changed since you were last screened, and you are at increased risk for chlamydia or gonorrhea. Ask your health care provider if you are at risk. Ask your health care provider about whether you are at high risk for HIV. Your health care provider may recommend a prescription medicine to help prevent HIV infection. If you choose to take medicine to prevent HIV, you should first get tested for HIV. You should then be tested  every 3 months for as long as you are taking the medicine. Follow these instructions at home: Alcohol use Do not drink alcohol if your health care provider tells you not to drink. If you drink alcohol: Limit how much you have to 0-2 drinks a day. Know how much alcohol is in your drink. In the [place] one drink equals one 12 oz bottle of beer (355 mL), one 5 oz glass of wine (148 mL), or one 1 oz glass of hard liquor (44 mL). Lifestyle Do not use any products that contain nicotine or tobacco. These products include cigarettes, chewing tobacco, and vaping devices, such as e-cigarettes. If you need help quitting, ask your health care provider. Do not use street drugs. Do not share needles. Ask your health care provider for help if you need support or information about quitting drugs. General instructions Schedule regular health, dental, and eye exams. Stay current with your vaccines. Tell your health care provider if: You often feel depressed. You have ever been abused or do not feel safe at  home. Summary Adopting a healthy lifestyle and getting preventive care are important in promoting health and wellness. Follow your health care provider's instructions about healthy diet, exercising, and getting tested or screened for diseases. Follow your health care provider's instructions on monitoring your cholesterol and blood pressure. This information is not intended to replace advice given to you by your health care provider. Make sure you discuss any questions you have with your health care provider. Document Revised: 05/14/2020 Document Reviewed: 05/14/2020 Elsevier Patient Education  [employer].

## 2022-05-14 NOTE — Progress Notes (Signed)
MEDICARE ANNUAL WELLNESS VISIT  05/14/2022  Telephone Visit Disclaimer This Medicare AWV was conducted by telephone due to national recommendations for restrictions regarding the COVID-19 Pandemic (e.g. social distancing).  I verified, using two identifiers, that I am speaking with Jason Abbott. or their authorized healthcare agent. I discussed the limitations, risks, security, and privacy concerns of performing an evaluation and management service by telephone and the potential availability of an in-person appointment in the future. The patient expressed understanding and agreed to proceed.  Location of Patient: Home Location of Provider (nurse):  In the office  Subjective:    Jason Abbott. is a 77 y.o. male patient of Thekkekandam, Ihor Austin, MD who had a Medicare Annual Wellness Visit today via telephone. Farhaan is Retired and lives with their spouse. he has 1 child. he reports that he is socially active and does interact with friends/family regularly. he is minimally physically active and enjoys music.  Patient Care Team: Monica Becton, MD as PCP - General (Family Medicine) Monica Becton, MD as Consulting Physician (Sports Medicine)     05/14/2022    2:09 PM 01/21/2022   12:36 AM 01/07/2022    9:26 AM 12/18/2021    4:00 PM 12/18/2021    7:12 AM 12/12/2021    1:32 PM 11/25/2021    8:38 AM  Advanced Directives  Does Patient Have a Medical Advance Directive? No No No No No No No  Would patient like information on creating a medical advance directive? No - Patient declined No - Patient declined No - Patient declined   No - Patient declined     Hospital Utilization Over the Past 12 Months: # of hospitalizations or ER visits: 6 # of surgeries: 3  Review of Systems    Patient reports that his overall health is better compared to last year.  History obtained from chart review and the patient  Patient Reported Readings (BP, Pulse, CBG, Weight,  etc) none  Pain Assessment Pain : No/denies pain     Current Medications & Allergies (verified) Allergies as of 05/14/2022       Reactions   Levaquin [levofloxacin] Other (See Comments)   Due to medical hx        Medication List        Accurate as of May 14, 2022  2:24 PM. If you have any questions, ask your nurse or doctor.          apixaban 5 MG Tabs tablet Commonly known as: ELIQUIS Take 1 tablet (5 mg total) by mouth 2 (two) times daily.   aspirin EC 81 MG tablet Take 1 tablet (81 mg total) by mouth daily at 6 (six) AM. Swallow whole.   AZO CRANBERRY PO Take 1 each by mouth in the Abbott.   b complex vitamins capsule Take 1 capsule by mouth daily.   Breztri Aerosphere 160-9-4.8 MCG/ACT Aero Generic drug: Budeson-Glycopyrrol-Formoterol Inhale 2 puffs into the lungs daily.   magnesium oxide 400 MG tablet Commonly known as: MAG-OX Take 400 mg by mouth daily.   MULTIVITAMIN GUMMIES ADULTS PO Take 2 tablets by mouth in the Abbott.   tamsulosin 0.4 MG Caps capsule Commonly known as: FLOMAX Take 1 capsule (0.4 mg total) by mouth in the Abbott.   VITAMIN B 12 PO Take 1 tablet by mouth in the Abbott.        History (reviewed): Past Medical History:  Diagnosis Date   Aberrant right subclavian artery  Aortic arch aneurysm (HCC)    Arthritis    Bowel obstruction (HCC)    CLL (chronic lymphocytic leukemia) (HCC)    COVID 2021   hospitalized for it   Diverticul disease small and large intestine, no perforati or abscess    GERD (gastroesophageal reflux disease)    uses tums as needed   H/O urinary infection    Pneumonia 09/22/2021   Shingles outbreak 10/03/2013   Stroke (HCC) 01/2019   Past Surgical History:  Procedure Laterality Date   AORTIC ARCH DEBRANCHING N/A 01/10/2022   Procedure: AORTIC ARCH DEBRANCHING WITH 16 X 8 X 10 MM HEMASHIELD GOLD GRAFT;  Surgeon: Alleen Borne, MD;  Location: [facility] OR;  Service: Open Heart Surgery;  Laterality:  N/A;   CAROTID-SUBCLAVIAN BYPASS GRAFT Right 12/18/2021   Procedure: RIGHT BYPASS GRAFT CAROTID-SUBCLAVIAN;  Surgeon: Nada Libman, MD;  Location: [facility] OR;  Service: Vascular;  Laterality: Right;   CARPAL TUNNEL RELEASE Right 11/25/2021   Procedure: CARPAL TUNNEL RELEASE;  Surgeon: Marlyne Beards, MD;  Location: [facility] OR;  Service: Orthopedics;  Laterality: Right;  or MAC with regional block 60   HERNIA REPAIR     MECKEL DIVERTICULUM EXCISION     ORIF FINGER / THUMB FRACTURE     TEE WITHOUT CARDIOVERSION N/A 01/10/2022   Procedure: TRANSESOPHAGEAL ECHOCARDIOGRAM (TEE);  Surgeon: Alleen Borne, MD;  Location: [facility] OR;  Service: Open Heart Surgery;  Laterality: N/A;   TENOSYNOVECTOMY Right 11/25/2021   Procedure: FLEXOR TENOSYNOVECTOMY;  Surgeon: Marlyne Beards, MD;  Location: [facility] OR;  Service: Orthopedics;  Laterality: Right;  or MAC with regional block 60   THORACIC AORTIC ENDOVASCULAR STENT GRAFT N/A 01/10/2022   Procedure: THORACIC AORTIC ENDOVASCULAR STENT GRAFT;  Surgeon: Nada Libman, MD;  Location: [facility] OR;  Service: Vascular;  Laterality: N/A;   ulna nerve surgery     Family History  Problem Relation Age of Onset   Cancer Mother        pancreatic   COPD Father    Diabetes Sister    Diabetes Brother    Diabetes Brother    Social History   Socioeconomic History   Marital status: Married    Spouse name: Boneta Lucks   Number of children: 1   Years of education: 14   Highest education level: Some college, no degree  Occupational History    Comment: retired Electronics engineer, Solicitor,  Tobacco Use   Smoking status: Former    Packs/day: 0.50    Years: 23.00    Additional pack years: 0.00    Total pack years: 11.50    Types: Cigarettes    Quit date: 1989    Years since quitting: 35.3   Smokeless tobacco: Never  Vaping Use   Vaping Use: Never used  Substance and Sexual Activity   Alcohol use: Not Currently    Comment: rarely   Drug use: No   Sexual activity: Not on file  Other Topics  Concern   Not on file  Social History Narrative   Lives with his wife. His son lives in [place]. He enjoys music.   Social Determinants of Health   Financial Resource Strain: Low Risk  (05/14/2022)   Overall Financial Resource Strain (CARDIA)    Difficulty of Paying Living Expenses: Not hard at all  Food Insecurity: No Food Insecurity (05/14/2022)   Hunger Vital Sign    Worried About Running Out of Food in the Last Year: Never true    Ran Out of Food in  the Last Year: Never true  Transportation Needs: No Transportation Needs (05/14/2022)   PRAPARE - Administrator, Civil Service (Medical): No    Lack of Transportation (Non-Medical): No  Physical Activity: Insufficiently Active (05/14/2022)   Exercise Vital Sign    Days of Exercise per Week: 3 days    Minutes of Exercise per Session: 20 min  Stress: No Stress Concern Present (05/14/2022)   [employer] of Occupational Health - Occupational Stress Questionnaire    Feeling of Stress : Not at all  Social Connections: Moderately Isolated (05/14/2022)   Social Connection and Isolation Panel [NHANES]    Frequency of Communication with Friends and Family: More than three times a week    Frequency of Social Gatherings with Friends and Family: More than three times a week    Attends Religious Services: Never    Database administrator or Organizations: No    Attends Banker Meetings: Never    Marital Status: Married    Activities of Daily Living    05/14/2022    2:15 PM 01/21/2022   12:35 AM  In your present state of health, do you have any difficulty performing the following activities:  Hearing? 0 1  Vision? 0 0  Difficulty concentrating or making decisions? 0 0  Walking or climbing stairs? 0 0  Dressing or bathing? 0 0  Doing errands, shopping? 0 0  Preparing Food and eating ? N   Using the Toilet? N   In the past six months, have you accidently leaked urine? N   Do you have problems with loss of bowel control? N    Managing your Medications? N   Managing your Finances? N   Housekeeping or managing your Housekeeping? N     Patient Education/ Literacy How often do you need to have someone help you when you read instructions, pamphlets, or other written materials from your doctor or pharmacy?: 1 - Never What is the last grade level you completed in school?: 2 years of college  Exercise Current Exercise Habits: Home exercise routine, Type of exercise: walking, Time (Minutes): 20, Frequency (Times/Week): 3, Weekly Exercise (Minutes/Week): 60, Intensity: Moderate, Exercise limited by: None identified  Diet Patient reports consuming  4-5 small  meals a day and 0 snack(s) a day Patient reports that his primary diet is: Regular Patient reports that she does have regular access to food.   Depression Screen    05/14/2022    2:10 PM 02/28/2020    2:38 PM 01/12/2018   11:23 AM  PHQ 2/9 Scores  PHQ - 2 Score 0 0 0     Fall Risk    05/14/2022    2:10 PM 02/28/2020    2:38 PM 08/04/2018    3:50 PM  Fall Risk   Falls in the past year? 0 0   Comment   Emmi Telephone Survey: data to providers prior to load  Number falls in past yr: 0 0   Comment   Emmi Telephone Survey Actual Response =   Injury with Fall? 0 0   Risk for fall due to : No Fall Risks    Follow up Falls evaluation completed       Objective:  Jason Abbott. seemed alert and oriented and he participated appropriately during our telephone visit.  Blood Pressure Weight BMI  BP Readings from Last 3 Encounters:  04/15/22 133/81  04/08/22 133/78  03/03/22 119/75   Wt Readings from Last 3  Encounters:  04/08/22 180 lb (81.6 kg)  03/03/22 179 lb 14.4 oz (81.6 kg)  02/12/22 181 lb (82.1 kg)   BMI Readings from Last 1 Encounters:  04/08/22 25.46 kg/m    *Unable to obtain current vital signs, weight, and BMI due to telephone visit type  Hearing/Vision  Susann Givens did not seem to have difficulty with hearing/understanding during the  telephone conversation Reports that he has had a formal eye exam by an eye care professional within the past year Reports that he has had a formal hearing evaluation within the past year *Unable to fully assess hearing and vision during telephone visit type  Cognitive Function:    05/14/2022    2:20 PM  6CIT Screen  What Year? 0 points  What month? 0 points  What time? 0 points  Count back from 20 0 points  Months in reverse 0 points  Repeat phrase 0 points  Total Score 0 points   (Normal:0-7, Significant for Dysfunction: >8)  Normal Cognitive Function Screening: Yes   Immunization & Health Maintenance Record Immunization History  Administered Date(s) Administered   Influenza-Unspecified 10/25/2015   Pneumococcal Polysaccharide-23 01/19/2012   Tdap 01/07/2015    Health Maintenance  Topic Date Due   COVID-19 Vaccine (1) 05/30/2022 (Originally 02/22/1950)   Zoster Vaccines- Shingrix (1 of 2) 08/14/2022 (Originally 02/23/1964)   Pneumonia Vaccine 70+ Years old (2 of 2 - PCV) 05/14/2023 (Originally 01/18/2013)   INFLUENZA VACCINE  08/07/2022   Medicare Annual Wellness (AWV)  05/14/2023   DTaP/Tdap/Td (2 - Td or Tdap) 01/06/2025   Hepatitis C Screening  Completed   HPV VACCINES  Aged Out   COLONOSCOPY (Pts 45-59yrs Insurance coverage will need to be confirmed)  Discontinued       Assessment  This is a routine wellness examination for [employer][name]  Health Maintenance: Due or Overdue There are no preventive care reminders to display for this patient.   Jason Abbott. does not need a referral for Community Assistance: Care Management:   no Social Work:    no Prescription Assistance:  no Nutrition/Diabetes Education:  no   Plan:  Personalized Goals  Goals Addressed               This Visit's Progress     Patient Stated (pt-stated)        Patient stated that he would like to be able to recover well from his recent surgeries.       Personalized  Health Maintenance & Screening Recommendations  Pneumococcal vaccine  Shingles vaccine  Patient declined the vaccines at this time.   Lung Cancer Screening Recommended: no (Low Dose CT Chest recommended if Age 73-80 years, 30 pack-year currently smoking OR have quit w/in past 15 years) Hepatitis C Screening recommended: no HIV Screening recommended: no  Advanced Directives: Written information was not prepared per patient's request.  Referrals & Orders No orders of the defined types were placed in this encounter.   Follow-up Plan Follow-up with Monica Becton, MD as planned Medicare wellness visit in one year.  Patient will access AVS on my chart.   I have personally reviewed and noted the following in the patient's chart:   Medical and social history Use of alcohol, tobacco or illicit drugs  Current medications and supplements Functional ability and status Nutritional status Physical activity Advanced directives List of other physicians Hospitalizations, surgeries, and ER visits in previous 12 months Vitals Screenings to include cognitive, depression, and falls Referrals and  appointments  In addition, I have reviewed and discussed with Jason Abbott. certain preventive protocols, quality metrics, and best practice recommendations. A written personalized care plan for preventive services as well as general preventive health recommendations is available and can be mailed to the patient at his request.      Modesto Charon, RN BSN  05/14/2022

## 2022-05-19 ENCOUNTER — Ambulatory Visit: Payer: Medicare Other | Admitting: Surgery

## 2022-05-22 ENCOUNTER — Other Ambulatory Visit: Payer: Self-pay | Admitting: Surgery

## 2022-05-23 ENCOUNTER — Telehealth: Payer: Medicare Other | Admitting: Family Medicine

## 2022-05-23 DIAGNOSIS — R399 Unspecified symptoms and signs involving the genitourinary system: Secondary | ICD-10-CM

## 2022-05-23 NOTE — Progress Notes (Signed)
E-Visit for Urinary Problems ? ?Based on what you shared with me, I feel your condition warrants further evaluation and I recommend that you be seen for a face to face office visit.  Male bladder infections are not very common.  We worry about prostate or kidney conditions.  The standard of care is to examine the abdomen and kidneys, and to do a urine and blood test to make sure that something more serious is not going on.  We recommend that you see a provider today.  If your doctor's office is closed [facility] has the following Urgent Cares: ? ?  ?NOTE: You will not be charged for this e-visit. ? ?If you are having a true medical emergency please call 911.   ? ?  ? For an urgent face to face visit, [facility] has six urgent care centers for your convenience:  ?  ? [facility] [place] ?Get Driving Directions ?[phone] ?[address] ?[place], [place] [address] ?  ? [facility] ([place]) ?Get Driving Directions ?[phone] ?[address] ?[place], [place] [address] ? ?[facility] [facility] [place]) ?Get Driving Directions ?[phone] ?[address] ?[place],  [place]  [address] ? ?[facility] ?Get Driving Directions ?[phone] ?[address] ?[place], [place] [address] ?  ?[facility] [place] ?Get Driving Directions  ?[phone] ?[address] ?[address] ?[place], [place] [address] ?  ? [facility] at [place] ?Get Driving Directions ?[phone] ?[address] ?[place], [place] [address] ? ?Your MyChart E-visit questionnaire answers were reviewed by a board certified advanced clinical practitioner to complete your personal care plan based on your specific symptoms.  Thank you for using e-Visits. ?

## 2022-05-24 ENCOUNTER — Ambulatory Visit
Admission: RE | Admit: 2022-05-24 | Discharge: 2022-05-24 | Disposition: A | Discharge status: Home / Self Care | Payer: Medicare Other | Source: Ambulatory Visit | Attending: Family Medicine | Admitting: Family Medicine

## 2022-05-24 VITALS — BP 116/68 | HR 70 | Temp 97.8°F | Resp 19

## 2022-05-24 DIAGNOSIS — R350 Frequency of micturition: Secondary | ICD-10-CM | POA: Diagnosis not present

## 2022-05-24 DIAGNOSIS — I4891 Unspecified atrial fibrillation: Secondary | ICD-10-CM

## 2022-05-24 DIAGNOSIS — N3001 Acute cystitis with hematuria: Secondary | ICD-10-CM | POA: Diagnosis not present

## 2022-05-24 LAB — POCT URINALYSIS DIP (MANUAL ENTRY)
Bilirubin, UA: NEGATIVE
Glucose, UA: NEGATIVE mg/dL
Ketones, POC UA: NEGATIVE mg/dL
Nitrite, UA: POSITIVE — AB
Protein Ur, POC: 30 mg/dL — AB
Spec Grav, UA: 1.02 (ref 1.010–1.025)
Urobilinogen, UA: 0.2 E.U./dL
pH, UA: 7.5 (ref 5.0–8.0)

## 2022-05-24 MED ORDER — SULFAMETHOXAZOLE-TRIMETHOPRIM 800-160 MG PO TABS: 1.0000 | ORAL_TABLET | Freq: Two times a day (BID) | ORAL | 0 refills | Status: AC

## 2022-05-24 MED ORDER — APIXABAN 5 MG PO TABS: 5.0000 mg | ORAL_TABLET | Freq: Two times a day (BID) | ORAL | 0 refills | Status: DC

## 2022-05-24 NOTE — ED Triage Notes (Addendum)
[facility] with [facility] of frequent need to urinate, difficulty starting stream and fever since yesterday.   Pts wife reports she gave him two 500 mg keflex last night.

## 2022-05-24 NOTE — Discharge Instructions (Addendum)
Advised patient of urinalysis results today.  Instructed patient to take medication as directed with food to completion.  Encouraged increase daily water intake to 64 ounces per day while taking this medication.  Advised we will follow-up with urine culture results once received.  Advised patient to take apixaban 5 mg twice daily as initially prescribed.  Advised patient to follow-up with cardiology regarding follow-up prescriptions.

## 2022-05-24 NOTE — ED Provider Notes (Signed)
[name]    CSN: [id number] Arrival date & time: 05/24/22  1245      History   Chief Complaint Chief Complaint  Patient presents with   Urinary Frequency   Dysuria    HPI Jason Oetting. is a 77 y.o. male.   HPI PLEASANT 14-YEAR-OLD MALE PRESENTS WITH URINARY FREQUENCY FOR 1 DAY.  PATIENT IS ACCOMPANIED BY HIS WIFE THIS AFTERNOON WHO REPORTS GIVING HIM 500 MG KEFLEX X 2 LAST NIGHT. Patient's wife request refill of apixaban as he has taken his last dose this morning and cannot get in touch with normal prescribing provider (cardiologist).  PMH significant for A-fib, stroke, aortic arch aneurysm, CLL and BPH.  Patient is currently on apixaban and denies any unusual bleeding  Past Medical History:  Diagnosis Date   Aberrant right subclavian artery    Aortic arch aneurysm (HCC)    Arthritis    Bowel obstruction (HCC)    CLL (chronic lymphocytic leukemia) (HCC)    COVID 2021   hospitalized for it   Diverticul disease small and large intestine, no perforati or abscess    GERD (gastroesophageal reflux disease)    uses tums as needed   H/O urinary infection    Pneumonia 09/22/2021   Shingles outbreak 10/03/2013   Stroke (HCC) 01/2019    Patient Active Problem List   Diagnosis Date Noted   Atrial fibrillation (HCC) 01/21/2022   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   Urinary tract infection 01/03/2022   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/17/2021   Hyponatremia 09/13/2021   Hypoalbuminemia 09/13/2021   BPH (benign prostatic hyperplasia) 09/13/2021   Leg cramping 09/13/2021   Acute respiratory failure with hypoxia (HCC) 09/09/2021   Thrombocytopenia (HCC) 09/09/2021   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   Ingrown right greater toenail 08/12/2021   Chronic headaches 08/12/2021   Right hand pain 03/11/2021   Tinnitus 03/11/2021   Elevated TSH 09/12/2020   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   Community acquired bilateral lower lobe  pneumonia 03/16/2019   CLL (chronic lymphocytic leukemia) (HCC) 02/23/2019   Aortic arch aneurysm (HCC) 02/09/2019   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/27/2019   Lumbar spinal stenosis 12/21/2018   Numbness and tingling in both hands 10/20/2018   Hyperlipidemia, mixed 06/01/2018   Seborrheic keratosis 01/12/2018   Combined forms of age-related cataract of left eye 03/23/2014   Regular astigmatism of left eye 03/23/2014   Dyspepsia 02/23/2014   Right shoulder pain 10/03/2013    Past Surgical History:  Procedure Laterality Date   AORTIC ARCH DEBRANCHING N/A 01/10/2022   Procedure: AORTIC ARCH DEBRANCHING WITH 16 X 8 X 10 MM HEMASHIELD GOLD GRAFT;  Surgeon: Alleen Borne, MD;  Location: [facility] OR;  Service: Open Heart Surgery;  Laterality: N/A;   CAROTID-SUBCLAVIAN BYPASS GRAFT Right 12/18/2021   Procedure: RIGHT BYPASS GRAFT CAROTID-SUBCLAVIAN;  Surgeon: Nada Libman, MD;  Location: [facility] OR;  Service: Vascular;  Laterality: Right;   CARPAL TUNNEL RELEASE Right 11/25/2021   Procedure: CARPAL TUNNEL RELEASE;  Surgeon: Marlyne Beards, MD;  Location: [facility] OR;  Service: Orthopedics;  Laterality: Right;  or MAC with regional block 60   HERNIA REPAIR     MECKEL DIVERTICULUM EXCISION     ORIF FINGER / THUMB FRACTURE     TEE WITHOUT CARDIOVERSION N/A 01/10/2022   Procedure: TRANSESOPHAGEAL ECHOCARDIOGRAM (TEE);  Surgeon: Alleen Borne, MD;  Location: [facility] OR;  Service: Open Heart Surgery;  Laterality: N/A;   TENOSYNOVECTOMY Right 11/25/2021  Procedure: FLEXOR TENOSYNOVECTOMY;  Surgeon: Marlyne Beards, MD;  Location: [facility] OR;  Service: Orthopedics;  Laterality: Right;  or MAC with regional block 60   THORACIC AORTIC ENDOVASCULAR STENT GRAFT N/A 01/10/2022   Procedure: THORACIC AORTIC ENDOVASCULAR STENT GRAFT;  Surgeon: Nada Libman, MD;  Location: [facility] OR;  Service: Vascular;  Laterality: N/A;   ulna nerve surgery         Home Medications    Prior to Admission medications    Medication Sig Start Date End Date Taking? Authorizing Provider  apixaban (ELIQUIS) 5 MG TABS tablet Take 1 tablet (5 mg total) by mouth 2 (two) times daily. 05/24/22 06/23/22 Yes Trevor Iha, FNP  sulfamethoxazole-trimethoprim (BACTRIM DS) 800-160 MG tablet Take 1 tablet by mouth 2 (two) times daily for 7 days. 05/24/22 05/31/22 Yes Trevor Iha, FNP  apixaban (ELIQUIS) 5 MG TABS tablet Take 1 tablet (5 mg total) by mouth 2 (two) times daily. 03/13/22   Alleen Borne, MD  aspirin EC 81 MG tablet Take 1 tablet (81 mg total) by mouth daily at 6 (six) AM. Swallow whole. 12/20/21   Schuh, McKenzi P, PA-C  b complex vitamins capsule Take 1 capsule by mouth daily.    [provider]  Budeson-Glycopyrrol-Formoterol (BREZTRI AEROSPHERE) 160-9-4.8 MCG/ACT AERO Inhale 2 puffs into the lungs daily. 01/21/22   Gold, Wayne E, PA-C  Cranberry-Vitamin C-Probiotic (AZO CRANBERRY PO) Take 1 each by mouth in the morning.    [provider]  Cyanocobalamin (VITAMIN B 12 PO) Take 1 tablet by mouth in the morning. Patient not taking: Reported on 05/14/2022    [provider]  magnesium oxide (MAG-OX) 400 MG tablet Take 400 mg by mouth daily. 10/14/20   [provider]  Multiple Vitamins-Minerals (MULTIVITAMIN GUMMIES ADULTS PO) Take 2 tablets by mouth in the morning.    [provider]  tamsulosin (FLOMAX) 0.4 MG CAPS capsule Take 1 capsule (0.4 mg total) by mouth in the morning. 01/29/22   Monica Becton, MD    Family History Family History  Problem Relation Age of Onset   Cancer Mother        pancreatic   COPD Father    Diabetes Sister    Diabetes Brother    Diabetes Brother     Social History Social History   Tobacco Use   Smoking status: Former    Packs/day: 0.50    Years: 23.00    Additional pack years: 0.00    Total pack years: 11.50    Types: Cigarettes    Quit date: 1989    Years since quitting: 35.4   Smokeless tobacco: Never  Vaping Use    Vaping Use: Never used  Substance Use Topics   Alcohol use: Not Currently    Comment: rarely   Drug use: No     Allergies   Levaquin [levofloxacin]   Review of Systems Review of Systems  Genitourinary:  Positive for dysuria and frequency.  All other systems reviewed and are negative.    Physical Exam Triage Vital Signs ED Triage Vitals  Enc Vitals Group     BP 05/24/22 1314 116/68     Pulse Rate 05/24/22 1314 70     Resp 05/24/22 1314 19     Temp 05/24/22 1314 97.8 F (36.6 C)     Temp src --      SpO2 05/24/22 1314 98 %     Weight --      Height --  Head Circumference --      Peak Flow --      Pain Score 05/24/22 1313 0     Pain Loc --      Pain Edu? --      Excl. in GC? --    No data found.  Updated Vital Signs BP 116/68   Pulse 70   Temp 97.8 F (36.6 C)   Resp 19   SpO2 98%      Physical Exam Vitals and nursing note reviewed.  Constitutional:      General: He is not in acute distress.    Appearance: Normal appearance. He is normal weight. He is not ill-appearing.  HENT:     Head: Normocephalic and atraumatic.     Mouth/Throat:     Mouth: Mucous membranes are moist.     Pharynx: Oropharynx is clear.  Eyes:     Extraocular Movements: Extraocular movements intact.     Conjunctiva/sclera: Conjunctivae normal.     Pupils: Pupils are equal, round, and reactive to light.  Cardiovascular:     Pulses: Normal pulses.     Heart sounds: Normal heart sounds.     Comments: Irregularly irregular Pulmonary:     Effort: Pulmonary effort is normal.     Breath sounds: Normal breath sounds. No wheezing, rhonchi or rales.  Abdominal:     Tenderness: There is no right CVA tenderness or left CVA tenderness.  Musculoskeletal:        General: Normal range of motion.     Cervical back: Normal range of motion and neck supple.  Skin:    General: Skin is warm and dry.  Neurological:     General: No focal deficit present.     Mental Status: He is alert  and oriented to person, place, and time. Mental status is at baseline.  Psychiatric:        Mood and Affect: Mood normal.        Behavior: Behavior normal.        Thought Content: Thought content normal.        Judgment: Judgment normal.      UC Treatments / Results  Labs (all labs ordered are listed, but only abnormal results are displayed) Labs Reviewed  POCT URINALYSIS DIP (MANUAL ENTRY) - Abnormal; Notable for the following components:      Result Value   Clarity, UA cloudy (*)    Blood, UA moderate (*)    Protein Ur, POC =30 (*)    Nitrite, UA Positive (*)    Leukocytes, UA Small (1+) (*)    All other components within normal limits  URINE CULTURE    EKG   Radiology No results found.  Procedures Procedures (including critical care time)  Medications Ordered in UC Medications - No data to display  Initial Impression / Assessment and Plan / UC Course  I have reviewed the triage vital signs and the nursing notes.  Pertinent labs & imaging results that were available during my care of the patient were reviewed by me and considered in my medical decision making (see chart for details).     MDM: 1.  Acute cystitis with hematuria-UA revealed above, urine culture ordered, Rx'd Bactrim 800/160 mg tablet twice daily x 7 days; Advised patient of urinalysis results today.  Instructed patient to take medication as directed with food to completion.  Encouraged increase daily water intake to 64 ounces per day while taking this medication. 2.  Atrial fibrillation, unspecified type-Rx'd Apixaban  5 mg twice daily ongoing. Advised we will follow-up with urine culture results once received.  Advised patient to take apixaban 5 mg twice daily as initially prescribed.  Advised patient to follow-up with cardiology regarding follow-up prescriptions.  Patient discharged home, hemodynamically stable. Final Clinical Impressions(s) / UC Diagnoses   Final diagnoses:  Urinary frequency  Acute  cystitis with hematuria  Atrial fibrillation, unspecified type [facility])     Discharge Instructions      Advised patient of urinalysis results today.  Instructed patient to take medication as directed with food to completion.  Encouraged increase daily water intake to 64 ounces per day while taking this medication.  Advised we will follow-up with urine culture results once received.  Advised patient to take apixaban 5 mg twice daily as initially prescribed.  Advised patient to follow-up with cardiology regarding follow-up prescriptions.     ED Prescriptions     Medication Sig Dispense Auth. Provider   sulfamethoxazole-trimethoprim (BACTRIM DS) 800-160 MG tablet Take 1 tablet by mouth 2 (two) times daily for 7 days. 14 tablet Trevor Iha, FNP   apixaban (ELIQUIS) 5 MG TABS tablet Take 1 tablet (5 mg total) by mouth 2 (two) times daily. 60 tablet Trevor Iha, FNP      PDMP not reviewed this encounter.   Trevor Iha, FNP 05/24/22 1418

## 2022-05-26 ENCOUNTER — Encounter: Payer: Self-pay | Admitting: Surgery

## 2022-05-26 ENCOUNTER — Ambulatory Visit (INDEPENDENT_AMBULATORY_CARE_PROVIDER_SITE_OTHER): Payer: Medicare Other | Admitting: Surgery

## 2022-05-26 ENCOUNTER — Telehealth: Payer: Self-pay | Admitting: *Deleted

## 2022-05-26 VITALS — BP 134/84 | HR 7 | Temp 97.8°F | Resp 16 | Ht 70.5 in | Wt 185.0 lb

## 2022-05-26 DIAGNOSIS — I7122 Aneurysm of the aortic arch, without rupture: Secondary | ICD-10-CM

## 2022-05-26 DIAGNOSIS — M7989 Other specified soft tissue disorders: Secondary | ICD-10-CM

## 2022-05-26 LAB — URINE CULTURE: Culture: 20000 — AB

## 2022-05-26 NOTE — Telephone Encounter (Signed)
Contact patient's wife as a call was placed to the answering service over the weekend. Per wife, patient is running out of Eliquis and is requesting a refill. Advised wife, per Dr. Sharee Pimple note 2/7 that patient was placed on the medication for A-Fib. Advised after three months patient may come off if he remains in NSR. Advised wife to contact patient's PCP for rhythm check to ensure safe to come off medication as patient does not have a Development worker, international aid. Wife verbalized understanding.

## 2022-05-26 NOTE — Progress Notes (Signed)
[facility] [place]  Patient name: Jason Abbott. MRN: [id number] DOB: [birth year] Sex: male   REASON FOR VISIT:    Follow up  HISOTRY OF PRESENT ILLNESS:    Jason Eveland. is a 77 y.o. 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  This was done for a 6 cm aneurysm.  He stated several extra days in the hospital secondary to deconditioning.  He was started on Eliquis secondary to atrial fibrillation.   He is now becoming more active, getting outside and feeding the birds.  He also feels that his hoarseness is improving, especially the last couple.  He has been dealing with urinary tract infections     The patient suffers from CLL which has been stable.  He has a history of stroke in the past with no residual deficits.  He is a former smoker.   PAST MEDICAL HISTORY:   Past Medical History:  Diagnosis Date   Aberrant right subclavian artery    Aortic arch aneurysm (HCC)    Arthritis    Bowel obstruction (HCC)    CLL (chronic lymphocytic leukemia) (HCC)    COVID 2021   hospitalized for it   Diverticul disease small and large intestine, no perforati or abscess    GERD (gastroesophageal reflux disease)    uses tums as needed   H/O urinary infection    Pneumonia 09/22/2021   Shingles outbreak 10/03/2013   Stroke (HCC) 01/2019     FAMILY HISTORY:   Family History  Problem Relation Age of Onset   Cancer Mother        pancreatic   COPD Father    Diabetes Sister    Diabetes Brother    Diabetes Brother     SOCIAL HISTORY:   Social History   Tobacco Use   Smoking status: Former    Packs/day: 0.50    Years: 23.00    Additional pack years: 0.00    Total pack years: 11.50    Types: Cigarettes    Quit date: 1989    Years since quitting: 35.4   Smokeless tobacco: Never   Substance Use Topics   Alcohol use: Not Currently    Comment: rarely     ALLERGIES:   Allergies  Allergen Reactions   Levaquin [Levofloxacin] Other (See Comments)    Due to medical hx     CURRENT MEDICATIONS:   Current Outpatient Medications  Medication Sig Dispense Refill   apixaban (ELIQUIS) 5 MG TABS tablet Take 1 tablet (5 mg total) by mouth 2 (two) times daily. 60 tablet 1   apixaban (ELIQUIS) 5 MG TABS tablet Take 1 tablet (5 mg total) by mouth 2 (two) times daily. 60 tablet 0   aspirin EC 81 MG tablet Take 1 tablet (81 mg total) by mouth daily at 6 (six) AM. Swallow whole. 30 tablet 12   b complex vitamins capsule Take 1 capsule by mouth daily.     Cranberry-Vitamin C-Probiotic (AZO CRANBERRY PO) Take 1 each by mouth in the morning.     Cyanocobalamin (VITAMIN B 12 PO) Take 1 tablet by mouth in the morning.     Multiple Vitamins-Minerals (MULTIVITAMIN GUMMIES ADULTS PO) Take 2 tablets by mouth in the morning.     sulfamethoxazole-trimethoprim (BACTRIM DS) 800-160 MG tablet Take 1 tablet by mouth 2 (two) times daily for 7 days. 14 tablet  0   tamsulosin (FLOMAX) 0.4 MG CAPS capsule Take 1 capsule (0.4 mg total) by mouth in the morning. 90 capsule 3   Budeson-Glycopyrrol-Formoterol (BREZTRI AEROSPHERE) 160-9-4.8 MCG/ACT AERO Inhale 2 puffs into the lungs daily. (Patient not taking: Reported on 05/26/2022)     magnesium oxide (MAG-OX) 400 MG tablet Take 400 mg by mouth daily.     No current facility-administered medications for this visit.    REVIEW OF SYSTEMS:   [X]  denotes positive finding, [ ]  denotes negative finding Cardiac  Comments:  Chest pain or chest pressure:    Shortness of breath upon exertion:    Short of breath when lying flat:    Irregular heart rhythm:        Vascular    Pain in calf, thigh, or hip brought on by ambulation:    Pain in feet at night that wakes you up from your sleep:     Blood clot in your veins:    Leg swelling:         Pulmonary     Oxygen at home:    Productive cough:     Wheezing:         Neurologic    Sudden weakness in arms or legs:     Sudden numbness in arms or legs:     Sudden onset of difficulty speaking or slurred speech:    Temporary loss of vision in one eye:     Problems with dizziness:         Gastrointestinal    Blood in stool:     Vomited blood:         Genitourinary    Burning when urinating:     Blood in urine:        Psychiatric    Major depression:         Hematologic    Bleeding problems:    Problems with blood clotting too easily:        Skin    Rashes or ulcers:        Constitutional    Fever or chills:      PHYSICAL EXAM:   Vitals:   05/26/22 1222  BP: 134/84  Pulse: (!) 7  Resp: 16  Temp: 97.8 F (36.6 C)  TempSrc: Temporal  SpO2: 95%  Weight: 185 lb (83.9 kg)  Height: 5' 10.5" (1.791 m)    GENERAL: The patient is a well-nourished male, in no acute distress. The vital signs are documented above. CARDIAC: There is a regular rate and rhythm.  VASCULAR: Palpable radial and pedal pulses PULMONARY: Non-labored respirations ABDOMEN: Soft and non-tender  MUSCULOSKELETAL: There are no major deformities or cyanosis. NEUROLOGIC: No focal weakness or paresthesias are detected. SKIN: There are no ulcers or rashes noted. PSYCHIATRIC: The patient has a normal affect.  STUDIES:   None  MEDICAL ISSUES:   Status post thoracic aneurysm repair: The patient is scheduled to get his CT scan in a few weeks and I will be arranging a virtual visit to go over his scan.  He has been complaining of some leg swelling for which we have recommended getting into compression socks.  On exam today he did not have significant edema.  His hoarseness has also been improving and so we will delay ENT referral.    Charlena Cross, MD, FACS [facility] [phone] Pager [phone]

## 2022-05-27 ENCOUNTER — Telehealth (HOSPITAL_COMMUNITY): Payer: Self-pay | Admitting: Emergency Medicine

## 2022-05-27 MED ORDER — CEPHALEXIN 500 MG PO CAPS: 500.0000 mg | ORAL_CAPSULE | Freq: Two times a day (BID) | ORAL | 0 refills | Status: AC

## 2022-06-03 ENCOUNTER — Ambulatory Visit
Admission: RE | Admit: 2022-06-03 | Discharge: 2022-06-03 | Disposition: A | Discharge status: Home / Self Care | Payer: Medicare Other | Source: Ambulatory Visit | Attending: Family Medicine | Admitting: Family Medicine

## 2022-06-03 VITALS — BP 118/74 | HR 71 | Temp 97.7°F | Resp 16

## 2022-06-03 DIAGNOSIS — R350 Frequency of micturition: Secondary | ICD-10-CM

## 2022-06-03 LAB — POCT URINALYSIS DIP (MANUAL ENTRY)
Bilirubin, UA: NEGATIVE
Glucose, UA: NEGATIVE mg/dL
Ketones, POC UA: NEGATIVE mg/dL
Leukocytes, UA: NEGATIVE
Nitrite, UA: NEGATIVE
Protein Ur, POC: NEGATIVE mg/dL
Spec Grav, UA: 1.025 (ref 1.010–1.025)
Urobilinogen, UA: 0.2 E.U./dL
pH, UA: 5.5 (ref 5.0–8.0)

## 2022-06-03 NOTE — Discharge Instructions (Addendum)
Advised patient urinalysis revealed no acute cystitis and only trace hematuria most likely due to previous history of BPH.  Encouraged patient to increase daily water intake to 64 ounces per day.  Advised patient we will follow-up with urine culture results once received.  Advised if symptoms worsen and/or unresolved please follow-up with PCP, urology or here for further evaluation.

## 2022-06-03 NOTE — ED Triage Notes (Signed)
Pt presents for urine check. Finished all antibiotics.

## 2022-06-03 NOTE — ED Provider Notes (Signed)
[facility]    CSN: [id number] Arrival date & time: 06/03/22  1427      History   Chief Complaint Chief Complaint  Patient presents with   Urinary Frequency    Need follow-up urinalysis tests done to make sure UTI is gone. Completed antibiotic on Sunday morning. Have history of stubborn UTI's. Will be going on cross country trip starting June 2nd. - Entered by patient    HPI Jason Abbott. is a 77 y.o. male.   HPI Very pleasant 77 year old male presents for urine recheck.  Patient reports completing previously prescribed antibiotics and denies any current symptoms.  Patient reports going on cross-country trip starting on June 2 and wants to make sure all is okay with genitourinary.  PMH significant for CLL, aortic arch aneurysm, and history of frequent UTIs.  Past Medical History:  Diagnosis Date   Aberrant right subclavian artery    Aortic arch aneurysm (HCC)    Arthritis    Bowel obstruction (HCC)    CLL (chronic lymphocytic leukemia) (HCC)    COVID 2021   hospitalized for it   Diverticul disease small and large intestine, no perforati or abscess    GERD (gastroesophageal reflux disease)    uses tums as needed   H/O urinary infection    Pneumonia 09/22/2021   Shingles outbreak 10/03/2013   Stroke (HCC) 01/2019    Patient Active Problem List   Diagnosis Date Noted   Atrial fibrillation (HCC) 01/21/2022   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   Urinary tract infection 01/03/2022   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/17/2021   Hyponatremia 09/13/2021   Hypoalbuminemia 09/13/2021   BPH (benign prostatic hyperplasia) 09/13/2021   Leg cramping 09/13/2021   Acute respiratory failure with hypoxia (HCC) 09/09/2021   Thrombocytopenia (HCC) 09/09/2021   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   Ingrown right greater toenail 08/12/2021   Chronic headaches 08/12/2021   Right hand pain 03/11/2021   Tinnitus 03/11/2021   Elevated TSH  09/12/2020   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   Community acquired bilateral lower lobe pneumonia 03/16/2019   CLL (chronic lymphocytic leukemia) (HCC) 02/23/2019   Aortic arch aneurysm (HCC) 02/09/2019   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/27/2019   Lumbar spinal stenosis 12/21/2018   Numbness and tingling in both hands 10/20/2018   Hyperlipidemia, mixed 06/01/2018   Seborrheic keratosis 01/12/2018   Combined forms of age-related cataract of left eye 03/23/2014   Regular astigmatism of left eye 03/23/2014   Dyspepsia 02/23/2014   Right shoulder pain 10/03/2013    Past Surgical History:  Procedure Laterality Date   AORTIC ARCH DEBRANCHING N/A 01/10/2022   Procedure: AORTIC ARCH DEBRANCHING WITH 16 X 8 X 10 MM HEMASHIELD GOLD GRAFT;  Surgeon: Alleen Borne, MD;  Location: [facility] OR;  Service: Open Heart Surgery;  Laterality: N/A;   CAROTID-SUBCLAVIAN BYPASS GRAFT Right 12/18/2021   Procedure: RIGHT BYPASS GRAFT CAROTID-SUBCLAVIAN;  Surgeon: Nada Libman, MD;  Location: [facility] OR;  Service: Vascular;  Laterality: Right;   CARPAL TUNNEL RELEASE Right 11/25/2021   Procedure: CARPAL TUNNEL RELEASE;  Surgeon: Marlyne Beards, MD;  Location: [facility] OR;  Service: Orthopedics;  Laterality: Right;  or MAC with regional block 60   HERNIA REPAIR     MECKEL DIVERTICULUM EXCISION     ORIF FINGER / THUMB FRACTURE     TEE WITHOUT CARDIOVERSION N/A 01/10/2022   Procedure: TRANSESOPHAGEAL ECHOCARDIOGRAM (TEE);  Surgeon: Alleen Borne, MD;  Location: [facility] OR;  Service: Open  Heart Surgery;  Laterality: N/A;   TENOSYNOVECTOMY Right 11/25/2021   Procedure: FLEXOR TENOSYNOVECTOMY;  Surgeon: Marlyne Beards, MD;  Location: [facility] OR;  Service: Orthopedics;  Laterality: Right;  or MAC with regional block 60   THORACIC AORTIC ENDOVASCULAR STENT GRAFT N/A 01/10/2022   Procedure: THORACIC AORTIC ENDOVASCULAR STENT GRAFT;  Surgeon: Nada Libman, MD;  Location: [facility] OR;  Service: Vascular;  Laterality: N/A;   ulna nerve  surgery         Home Medications    Prior to Admission medications   Medication Sig Start Date End Date Taking? Authorizing Provider  apixaban (ELIQUIS) 5 MG TABS tablet Take 1 tablet (5 mg total) by mouth 2 (two) times daily. 03/13/22   Alleen Borne, MD  apixaban (ELIQUIS) 5 MG TABS tablet Take 1 tablet (5 mg total) by mouth 2 (two) times daily. 05/24/22 06/23/22  Trevor Iha, FNP  aspirin EC 81 MG tablet Take 1 tablet (81 mg total) by mouth daily at 6 (six) AM. Swallow whole. 12/20/21   Schuh, McKenzi P, PA-C  b complex vitamins capsule Take 1 capsule by mouth daily.    [provider]  Budeson-Glycopyrrol-Formoterol (BREZTRI AEROSPHERE) 160-9-4.8 MCG/ACT AERO Inhale 2 puffs into the lungs daily. Patient not taking: Reported on 05/26/2022 01/21/22   Gershon Crane E, PA-C  Cranberry-Vitamin C-Probiotic (AZO CRANBERRY PO) Take 1 each by mouth in the morning.    [provider]  Cyanocobalamin (VITAMIN B 12 PO) Take 1 tablet by mouth in the morning.    [provider]  magnesium oxide (MAG-OX) 400 MG tablet Take 400 mg by mouth daily. 10/14/20   [provider]  Multiple Vitamins-Minerals (MULTIVITAMIN GUMMIES ADULTS PO) Take 2 tablets by mouth in the morning.    [provider]  tamsulosin (FLOMAX) 0.4 MG CAPS capsule Take 1 capsule (0.4 mg total) by mouth in the morning. 01/29/22   Monica Becton, MD    Family History Family History  Problem Relation Age of Onset   Cancer Mother        pancreatic   COPD Father    Diabetes Sister    Diabetes Brother    Diabetes Brother     Social History Social History   Tobacco Use   Smoking status: Former    Packs/day: 0.50    Years: 23.00    Additional pack years: 0.00    Total pack years: 11.50    Types: Cigarettes    Quit date: 1989    Years since quitting: 35.4   Smokeless tobacco: Never  Vaping Use   Vaping Use: Never used  Substance Use Topics   Alcohol use: Not Currently     Comment: rarely   Drug use: No     Allergies   Levaquin [levofloxacin]   Review of Systems Review of Systems  Genitourinary:  Positive for frequency.  All other systems reviewed and are negative.    Physical Exam Triage Vital Signs ED Triage Vitals [06/03/22 1451]  Enc Vitals Group     BP 118/74     Pulse Rate 71     Resp 16     Temp 97.7 F (36.5 C)     Temp src      SpO2 98 %     Weight      Height      Head Circumference      Peak Flow      Pain Score 0     Pain Loc  Pain Edu?      Excl. in GC?    No data found.  Updated Vital Signs BP 118/74   Pulse 71   Temp 97.7 F (36.5 C)   Resp 16   SpO2 98%       Physical Exam Vitals and nursing note reviewed.  Constitutional:      Appearance: Normal appearance. He is normal weight.  HENT:     Head: Normocephalic and atraumatic.     Nose: Nose normal.     Mouth/Throat:     Mouth: Mucous membranes are moist.     Pharynx: Oropharynx is clear.  Eyes:     Extraocular Movements: Extraocular movements intact.     Conjunctiva/sclera: Conjunctivae normal.     Pupils: Pupils are equal, round, and reactive to light.  Cardiovascular:     Rate and Rhythm: Normal rate and regular rhythm.     Pulses: Normal pulses.     Heart sounds: Normal heart sounds.  Pulmonary:     Effort: Pulmonary effort is normal.     Breath sounds: Normal breath sounds. No wheezing, rhonchi or rales.  Abdominal:     Tenderness: There is no right CVA tenderness or left CVA tenderness.  Musculoskeletal:        General: Normal range of motion.     Cervical back: Normal range of motion and neck supple.  Skin:    General: Skin is warm and dry.  Neurological:     General: No focal deficit present.     Mental Status: He is alert and oriented to person, place, and time. Mental status is at baseline.  Psychiatric:        Mood and Affect: Mood normal.        Behavior: Behavior normal.        Thought Content: Thought content normal.         Judgment: Judgment normal.      UC Treatments / Results  Labs (all labs ordered are listed, but only abnormal results are displayed) Labs Reviewed  POCT URINALYSIS DIP (MANUAL ENTRY) - Abnormal; Notable for the following components:      Result Value   Blood, UA trace-intact (*)    All other components within normal limits  URINE CULTURE    EKG   Radiology No results found.  Procedures Procedures (including critical care time)  Medications Ordered in UC Medications - No data to display  Initial Impression / Assessment and Plan / UC Course  I have reviewed the triage vital signs and the nursing notes.  Pertinent labs & imaging results that were available during my care of the patient were reviewed by me and considered in my medical decision making (see chart for details).     MDM 1.  Urinary frequency-UA revealed above, trace hematuria more than likely due to history of BPH with urinary frequency, patient advised to increase daily water intake to 64 ounces per day, urine culture ordered.  Very pleasant patient advised of no urinary tract infection. Advised patient urinalysis revealed no acute cystitis and only trace hematuria most likely due to previous history of BPH.  Encouraged patient to increase daily water intake to 64 ounces per day.  Advised patient we will follow-up with urine culture results once received.  Advised if symptoms worsen and/or unresolved please follow-up with PCP, urology or here for further evaluation.  Final Clinical Impressions(s) / UC Diagnoses   Final diagnoses:  Urinary frequency     Discharge Instructions  Advised patient urinalysis revealed no acute cystitis and only trace hematuria most likely due to previous history of BPH.  Encouraged patient to increase daily water intake to 64 ounces per day.  Advised patient we will follow-up with urine culture results once received.  Advised if symptoms worsen and/or unresolved please follow-up  with PCP, urology or here for further evaluation.      ED Prescriptions   None    PDMP not reviewed this encounter.   Trevor Iha, FNP 06/03/22 1535

## 2022-06-04 LAB — URINE CULTURE: Culture: 20000 — AB

## 2022-06-05 ENCOUNTER — Ambulatory Visit (HOSPITAL_COMMUNITY)
Admission: RE | Admit: 2022-06-05 | Discharge: 2022-06-05 | Disposition: A | Discharge status: Home / Self Care | Payer: Medicare Other | Source: Ambulatory Visit | Attending: Surgery | Admitting: Surgery

## 2022-06-05 ENCOUNTER — Encounter: Payer: Self-pay | Admitting: Sports Medicine

## 2022-06-05 ENCOUNTER — Telehealth: Payer: Self-pay

## 2022-06-05 ENCOUNTER — Other Ambulatory Visit (HOSPITAL_COMMUNITY): Payer: Medicare Other

## 2022-06-05 DIAGNOSIS — I7122 Aneurysm of the aortic arch, without rupture: Secondary | ICD-10-CM | POA: Insufficient documentation

## 2022-06-05 DIAGNOSIS — I700 Atherosclerosis of aorta: Secondary | ICD-10-CM | POA: Diagnosis not present

## 2022-06-05 LAB — URINE CULTURE

## 2022-06-05 MED ORDER — IOHEXOL 350 MG/ML SOLN
120.0000 mL | Freq: Once | INTRAVENOUS | Status: AC | PRN
  Administered 2022-06-05: 120 mL via INTRAVENOUS

## 2022-06-05 MED ORDER — AMOXICILLIN-POT CLAVULANATE 875-125 MG PO TABS: 1.0000 | ORAL_TABLET | Freq: Two times a day (BID) | ORAL | 0 refills | Status: DC

## 2022-06-05 NOTE — Telephone Encounter (Signed)
Pt UCX came back, requesting abx. Msg routed to provider on staff. Will call pt back to discuss need for new abx.

## 2022-06-05 NOTE — Telephone Encounter (Signed)
-----   Message from Alleen Borne, MD sent at 06/04/2022  9:45 AM EDT ----- Regarding: RE: Eliquis refill If his rhythm is staying normal sinus then Eliquis could be stopped. ----- Message ----- From: Steve Rattler, RN Sent: 05/22/2022  10:25 AM EDT To: Alleen Borne, MD Subject: Eliquis refill                                 Hey,  Per your note, he is to remain on Eliquis 3 months post-op. He is past the 3 month post op, do you want to continue it? He has sent in another refill request.  Thanks, Morrie Sheldon

## 2022-06-05 NOTE — Telephone Encounter (Signed)
Called patient. After obtaining two identifiers, we further discussed his concerns. He states that he IS having symptoms. Reports continued frequency and strong urinary odor. Will do Augmentin BID x 7d. Pt will f/u with urology upon return home for his continued BPH sx. Also discouraged routine urine cultures in absence of symptoms to prevent tx for asx bacteriuria. Pt stated understanding and agreement with plan.

## 2022-06-10 ENCOUNTER — Ambulatory Visit (INDEPENDENT_AMBULATORY_CARE_PROVIDER_SITE_OTHER): Payer: Self-pay | Admitting: Surgery

## 2022-06-10 DIAGNOSIS — I7122 Aneurysm of the aortic arch, without rupture: Secondary | ICD-10-CM

## 2022-06-10 NOTE — Progress Notes (Signed)
CT scan results discussed with patient.  Axillary lymph node was highlighted.  I reached out to Dr. Myna Hidalgo for further follow-up given his leukemia history.  Otherwise he will see me back in 1 year with repeat CT scan

## 2022-06-11 ENCOUNTER — Ambulatory Visit (INDEPENDENT_AMBULATORY_CARE_PROVIDER_SITE_OTHER): Payer: Medicare Other | Admitting: Family Medicine

## 2022-06-11 ENCOUNTER — Encounter: Payer: Self-pay | Admitting: Family Medicine

## 2022-06-11 VITALS — BP 126/66 | HR 71 | Ht 70.5 in | Wt 184.8 lb

## 2022-06-11 DIAGNOSIS — R350 Frequency of micturition: Secondary | ICD-10-CM

## 2022-06-11 DIAGNOSIS — N419 Inflammatory disease of prostate, unspecified: Secondary | ICD-10-CM | POA: Insufficient documentation

## 2022-06-11 LAB — POCT URINALYSIS DIP (CLINITEK)
Bilirubin, UA: NEGATIVE
Glucose, UA: NEGATIVE mg/dL
Ketones, POC UA: NEGATIVE mg/dL
Nitrite, UA: NEGATIVE
POC PROTEIN,UA: NEGATIVE
Spec Grav, UA: 1.015 (ref 1.010–1.025)
Urobilinogen, UA: 0.2 E.U./dL
pH, UA: 7 (ref 5.0–8.0)

## 2022-06-11 MED ORDER — CEFDINIR 300 MG PO CAPS: 300.0000 mg | ORAL_CAPSULE | Freq: Two times a day (BID) | ORAL | 0 refills | Status: DC

## 2022-06-11 NOTE — Progress Notes (Signed)
Jason Abbott. - 77 y.o. male MRN [id number]  Date of birth: [birth year]  Subjective Chief Complaint  Patient presents with   Urinary Frequency    HPI Jason Abbott. Is 77 y.o. male here today to discuss continued urinary symptoms.  Over the past few weeks he has had symptoms of urinary frequency. Seen at urgent care a couple of times, treated with augmentin most recently for 7 day course.  Recent culture with E. Coli, resistant to bactrim.  He reports that current symptoms include continued frequency.  Tried flomax 2 capsules daily but this hasn't really improved his symptoms.  No recent PSA that I see in the system. Denies fever or chills.   ROS:  A comprehensive ROS was completed and negative except as noted per HPI  Allergies  Allergen Reactions   Levaquin [Levofloxacin] Other (See Comments)    Due to medical hx    Past Medical History:  Diagnosis Date   Aberrant right subclavian artery    Aortic arch aneurysm (HCC)    Arthritis    Bowel obstruction (HCC)    CLL (chronic lymphocytic leukemia) (HCC)    COVID 2021   hospitalized for it   Diverticul disease small and large intestine, no perforati or abscess    GERD (gastroesophageal reflux disease)    uses tums as needed   H/O urinary infection    Pneumonia 09/22/2021   Shingles outbreak 10/03/2013   Stroke (HCC) 01/2019    Past Surgical History:  Procedure Laterality Date   AORTIC ARCH DEBRANCHING N/A 01/10/2022   Procedure: AORTIC ARCH DEBRANCHING WITH 16 X 8 X 10 MM HEMASHIELD GOLD GRAFT;  Surgeon: Alleen Borne, MD;  Location: [facility] OR;  Service: Open Heart Surgery;  Laterality: N/A;   CAROTID-SUBCLAVIAN BYPASS GRAFT Right 12/18/2021   Procedure: RIGHT BYPASS GRAFT CAROTID-SUBCLAVIAN;  Surgeon: Nada Libman, MD;  Location: [facility] OR;  Service: Vascular;  Laterality: Right;   CARPAL TUNNEL RELEASE Right 11/25/2021   Procedure: CARPAL TUNNEL RELEASE;  Surgeon: Marlyne Beards, MD;  Location: [facility] OR;  Service:  Orthopedics;  Laterality: Right;  or MAC with regional block 60   HERNIA REPAIR     MECKEL DIVERTICULUM EXCISION     ORIF FINGER / THUMB FRACTURE     TEE WITHOUT CARDIOVERSION N/A 01/10/2022   Procedure: TRANSESOPHAGEAL ECHOCARDIOGRAM (TEE);  Surgeon: Alleen Borne, MD;  Location: [facility] OR;  Service: Open Heart Surgery;  Laterality: N/A;   TENOSYNOVECTOMY Right 11/25/2021   Procedure: FLEXOR TENOSYNOVECTOMY;  Surgeon: Marlyne Beards, MD;  Location: [facility] OR;  Service: Orthopedics;  Laterality: Right;  or MAC with regional block 60   THORACIC AORTIC ENDOVASCULAR STENT GRAFT N/A 01/10/2022   Procedure: THORACIC AORTIC ENDOVASCULAR STENT GRAFT;  Surgeon: Nada Libman, MD;  Location: [facility] OR;  Service: Vascular;  Laterality: N/A;   ulna nerve surgery      Social History   Socioeconomic History   Marital status: Married    Spouse name: Boneta Lucks   Number of children: 1   Years of education: 14   Highest education level: Some college, no degree  Occupational History    Comment: retired Electronics engineer, Solicitor,  Tobacco Use   Smoking status: Former    Packs/day: 0.50    Years: 23.00    Additional pack years: 0.00    Total pack years: 11.50    Types: Cigarettes    Quit date: 1989    Years since quitting: 35.4   Smokeless tobacco: Never  Vaping Use   Vaping Use: Never used  Substance and Sexual Activity   Alcohol use: Not Currently    Comment: rarely   Drug use: No   Sexual activity: Not on file  Other Topics Concern   Not on file  Social History Narrative   Lives with his wife. His son lives in [place]. He enjoys music.   Social Determinants of Health   Financial Resource Strain: Low Risk  (05/14/2022)   Overall Financial Resource Strain (CARDIA)    Difficulty of Paying Living Expenses: Not hard at all  Food Insecurity: No Food Insecurity (05/14/2022)   Hunger Vital Sign    Worried About Running Out of Food in the Last Year: Never true    Ran Out of Food in the Last Year: Never true   Transportation Needs: No Transportation Needs (05/14/2022)   PRAPARE - Administrator, Civil Service (Medical): No    Lack of Transportation (Non-Medical): No  Physical Activity: Insufficiently Active (05/14/2022)   Exercise Vital Sign    Days of Exercise per Week: 3 days    Minutes of Exercise per Session: 20 min  Stress: No Stress Concern Present (05/14/2022)   [employer] of Occupational Health - Occupational Stress Questionnaire    Feeling of Stress : Not at all  Social Connections: Moderately Isolated (05/14/2022)   Social Connection and Isolation Panel [NHANES]    Frequency of Communication with Friends and Family: More than three times a week    Frequency of Social Gatherings with Friends and Family: More than three times a week    Attends Religious Services: Never    Database administrator or Organizations: No    Attends Engineer, structural: Never    Marital Status: Married    Family History  Problem Relation Age of Onset   Cancer Mother        pancreatic   COPD Father    Diabetes Sister    Diabetes Brother    Diabetes Brother     Health Maintenance  Topic Date Due   COVID-19 Vaccine (1) Never done   Zoster Vaccines- Shingrix (1 of 2) 08/14/2022 (Originally 02/23/1964)   Pneumonia Vaccine 2+ Years old (2 of 2 - PCV) 05/14/2023 (Originally 01/18/2013)   INFLUENZA VACCINE  08/07/2022   Medicare Annual Wellness (AWV)  05/14/2023   DTaP/Tdap/Td (2 - Td or Tdap) 01/06/2025   Hepatitis C Screening  Completed   HPV VACCINES  Aged Out   Colonoscopy  Discontinued     ----------------------------------------------------------------------------------------------------------------------------------------------------------------------------------------------------------------- Physical Exam BP 126/66 (BP Location: Left Arm, Patient Position: Sitting, Cuff Size: Normal)   Pulse 71   Ht 5' 10.5" (1.791 m)   Wt 184 lb 12 oz (83.8 kg)   SpO2 99%   BMI  26.13 kg/m   Physical Exam Constitutional:      Appearance: Normal appearance.  HENT:     Head: Normocephalic and atraumatic.  Eyes:     General: No scleral icterus. Cardiovascular:     Rate and Rhythm: Normal rate and regular rhythm.  Pulmonary:     Effort: Pulmonary effort is normal.     Breath sounds: Normal breath sounds.  Neurological:     Mental Status: He is alert.  Psychiatric:        Mood and Affect: Mood normal.        Behavior: Behavior normal.     ------------------------------------------------------------------------------------------------------------------------------------------------------------------------------------------------------------------- Assessment and Plan  Prostatitis He continues to have urinary urgency, frequency and incomplete  emptying.  Has appt with urology next month.  E coli from recent culture resistant to bactrim.  Will add 2 week course of omnicef.  Checking PSA today.  May need prolonged course of antibiotics if symptoms persist.    Meds ordered this encounter  Medications   cefdinir (OMNICEF) 300 MG capsule    Sig: Take 1 capsule (300 mg total) by mouth 2 (two) times daily for 14 days.    Dispense:  28 capsule    Refill:  0    No follow-ups on file.    This visit occurred during the SARS-CoV-2 public health emergency.  Safety protocols were in place, including screening questions prior to the visit, additional usage of staff PPE, and extensive cleaning of exam room while observing appropriate contact time as indicated for disinfecting solutions.

## 2022-06-11 NOTE — Addendum Note (Signed)
Addended by: Ardyth Man on: 06/11/2022 11:42 AM   Modules accepted: Orders

## 2022-06-11 NOTE — Patient Instructions (Signed)
Stop Augmentin Start omnicef. Take for 2 weeks. Let me know if symptoms are not improving with this.

## 2022-06-11 NOTE — Assessment & Plan Note (Signed)
He continues to have urinary urgency, frequency and incomplete emptying.  Has appt with urology next month.  E coli from recent culture resistant to bactrim.  Will add 2 week course of omnicef.  Checking PSA today.  May need prolonged course of antibiotics if symptoms persist.

## 2022-06-12 LAB — PSA, TOTAL AND FREE
PSA, % Free: 20 % (calc) — ABNORMAL LOW (ref >25)
PSA, Free: 0.3 ng/mL
PSA, Total: 1.5 ng/mL (ref <=4.0)

## 2022-06-13 LAB — URINE CULTURE
MICRO NUMBER:: 15049917
Result:: NO GROWTH
SPECIMEN QUALITY:: ADEQUATE

## 2022-06-17 ENCOUNTER — Encounter: Payer: Self-pay | Admitting: Family Medicine

## 2022-06-17 MED ORDER — CEFDINIR 300 MG PO CAPS: 300.0000 mg | ORAL_CAPSULE | Freq: Two times a day (BID) | ORAL | 0 refills | Status: AC

## 2022-07-24 DIAGNOSIS — N390 Urinary tract infection, site not specified: Secondary | ICD-10-CM | POA: Insufficient documentation

## 2022-07-24 NOTE — Progress Notes (Signed)
Name: Jason Abbott. DOB: 1945/03/28 MRN: [id number]  History of Present Illness: Jason Abbott is a 77 y.o. male who presents today as a new patient at [facility]. All available relevant medical records have been reviewed. He is accompanied by his [name]. - GU history:  1. BPH with incomplete bladder emptying.  - He had a previous episode of urinary retention in 2016.  - PSA values have been normal (most recently was 0.3 on 06/11/2022). 2. Recurrent UTIs.  - No history of kidney stones. 3. Renal cysts (bilateral). Per CT abdomen/pelvis on 04/11/2019.   Previously seen by me at [facility] on 12/26/2020. Per that note: - PVR = 294 ml. - The plan was: 1. Flomax 0.4 mg daily. 2. RUS (negative for hydronephrosis). 3. Consider OTC supplements for UTI prevention.  Urine culture results in past 12 months: - 12/31/2021: Positive for >100k Citrobacter freundii - 01/13/2022: Negative - 04/08/2022: Positive for >100k Enterococcus faecalis - 04/15/2022: Negative - 05/24/2022: Positive for >20k E. coli - 06/03/2022: Positive for >20k E. coli - 06/11/2022: Negative  Relevant imaging: 06/05/2022: CT abdomen/pelvis showed no GU stones, masses, or hydronephrosis.  Today: He reports chief complaint of recurrent UTIs.   He reports 4 UTls in the past 12 months. When present, UTI symptoms include increased urinary urgency, frequency, nocturia, hesitancy; denies dysuria. Reports history of pyelonephritis 7-8 years ago.   He denies acute UTI symptoms today.  He denies increased urinary urgency, frequency, dysuria, gross hematuria, hesitancy, straining to void, or sensations of incomplete emptying. Reports nocturia (2x/night),    Fall Screening: Do you usually have a device to assist in your mobility? No   Medications: Current Outpatient Medications  Medication Sig Dispense Refill   aspirin EC 81 MG tablet Take 1 tablet (81 mg total) by mouth  daily at 6 (six) AM. Swallow whole. 30 tablet 12   b complex vitamins capsule Take 1 capsule by mouth daily.     cephALEXin (KEFLEX) 250 MG capsule Take 1 capsule (250 mg total) by mouth daily. 30 capsule 5   Cranberry-Vitamin C-Probiotic (AZO CRANBERRY PO) Take 1 each by mouth in the morning.     Cyanocobalamin (VITAMIN B 12 PO) Take 1 tablet by mouth in the morning.     Multiple Vitamins-Minerals (MULTIVITAMIN GUMMIES ADULTS PO) Take 2 tablets by mouth in the morning.     apixaban (ELIQUIS) 5 MG TABS tablet Take 1 tablet (5 mg total) by mouth 2 (two) times daily. 60 tablet 0   Budeson-Glycopyrrol-Formoterol (BREZTRI AEROSPHERE) 160-9-4.8 MCG/ACT AERO Inhale 2 puffs into the lungs daily. (Patient not taking: Reported on 07/28/2022)     tamsulosin (FLOMAX) 0.4 MG CAPS capsule Take 1 capsule (0.4 mg total) by mouth in the morning and at bedtime. 180 capsule 3   No current facility-administered medications for this visit.    Allergies: Allergies  Allergen Reactions   Levaquin [Levofloxacin] Other (See Comments)    Due to medical hx    Past Medical History:  Diagnosis Date   Aberrant right subclavian artery    Aortic arch aneurysm (HCC)    Arthritis    Bowel obstruction (HCC)    CLL (chronic lymphocytic leukemia) (HCC)    COVID 2021   hospitalized for it   Diverticul disease small and large intestine, no perforati or abscess    GERD (gastroesophageal reflux disease)    uses tums as needed   H/O urinary infection    Pneumonia  09/22/2021   Shingles outbreak 10/03/2013   Stroke (HCC) 01/2019   Past Surgical History:  Procedure Laterality Date   AORTIC ARCH DEBRANCHING N/A 01/10/2022   Procedure: AORTIC ARCH DEBRANCHING WITH 16 X 8 X 10 MM HEMASHIELD GOLD GRAFT;  Surgeon: Alleen Borne, MD;  Location: [facility] OR;  Service: Open Heart Surgery;  Laterality: N/A;   CAROTID-SUBCLAVIAN BYPASS GRAFT Right 12/18/2021   Procedure: RIGHT BYPASS GRAFT CAROTID-SUBCLAVIAN;  Surgeon: Nada Libman, MD;  Location: [facility] OR;  Service: Vascular;  Laterality: Right;   CARPAL TUNNEL RELEASE Right 11/25/2021   Procedure: CARPAL TUNNEL RELEASE;  Surgeon: Marlyne Beards, MD;  Location: [facility] OR;  Service: Orthopedics;  Laterality: Right;  or MAC with regional block 60   HERNIA REPAIR     MECKEL DIVERTICULUM EXCISION     ORIF FINGER / THUMB FRACTURE     TEE WITHOUT CARDIOVERSION N/A 01/10/2022   Procedure: TRANSESOPHAGEAL ECHOCARDIOGRAM (TEE);  Surgeon: Alleen Borne, MD;  Location: [facility] OR;  Service: Open Heart Surgery;  Laterality: N/A;   TENOSYNOVECTOMY Right 11/25/2021   Procedure: FLEXOR TENOSYNOVECTOMY;  Surgeon: Marlyne Beards, MD;  Location: [facility] OR;  Service: Orthopedics;  Laterality: Right;  or MAC with regional block 60   THORACIC AORTIC ENDOVASCULAR STENT GRAFT N/A 01/10/2022   Procedure: THORACIC AORTIC ENDOVASCULAR STENT GRAFT;  Surgeon: Nada Libman, MD;  Location: [facility] OR;  Service: Vascular;  Laterality: N/A;   ulna nerve surgery     Family History  Problem Relation Age of Onset   Cancer Mother        pancreatic   COPD Father    Diabetes Sister    Diabetes Brother    Diabetes Brother    Social History   Socioeconomic History   Marital status: Married    Spouse name: Jason Abbott   Number of children: 1   Years of education: 14   Highest education level: Some college, no degree  Occupational History    Comment: retired Electronics engineer, Solicitor,  Tobacco Use   Smoking status: Former    Current packs/day: 0.00    Average packs/day: 0.5 packs/day for 23.0 years (11.5 ttl pk-yrs)    Types: Cigarettes    Start date: 1966    Quit date: 1989    Years since quitting: 35.5   Smokeless tobacco: Never  Vaping Use   Vaping status: Never Used  Substance and Sexual Activity   Alcohol use: Not Currently    Comment: rarely   Drug use: No   Sexual activity: Not Currently  Other Topics Concern   Not on file  Social History Narrative   Lives with his wife. His son lives in [place]. He enjoys  music.   Social Determinants of Health   Financial Resource Strain: Low Risk  (05/14/2022)   Overall Financial Resource Strain (CARDIA)    Difficulty of Paying Living Expenses: Not hard at all  Food Insecurity: No Food Insecurity (05/14/2022)   Hunger Vital Sign    Worried About Running Out of Food in the Last Year: Never true    Ran Out of Food in the Last Year: Never true  Transportation Needs: No Transportation Needs (05/14/2022)   PRAPARE - Administrator, Civil Service (Medical): No    Lack of Transportation (Non-Medical): No  Physical Activity: Insufficiently Active (05/14/2022)   Exercise Vital Sign    Days of Exercise per Week: 3 days    Minutes of Exercise per Session: 20 min  Stress: No  Stress Concern Present (05/14/2022)   [employer] of Occupational Health - Occupational Stress Questionnaire    Feeling of Stress : Not at all  Social Connections: Moderately Isolated (05/14/2022)   Social Connection and Isolation Panel [NHANES]    Frequency of Communication with Friends and Family: More than three times a week    Frequency of Social Gatherings with Friends and Family: More than three times a week    Attends Religious Services: Never    Database administrator or Organizations: No    Attends Banker Meetings: Never    Marital Status: Married  Catering manager Violence: Not At Risk (05/14/2022)   Humiliation, Afraid, Rape, and Kick questionnaire    Fear of Current or Ex-Partner: No    Emotionally Abused: No    Physically Abused: No    Sexually Abused: No    SUBJECTIVE  Review of Systems Constitutional: Patient denies any unintentional weight loss or change in strength lntegumentary: Patient denies any rashes or pruritus Cardiovascular: Patient denies chest pain or syncope Respiratory: Patient denies shortness of breath Gastrointestinal: Patient denies nausea, vomiting, constipation, or diarrhea Musculoskeletal: Patient denies muscle cramps or  weakness Neurologic: Patient denies convulsions or seizures Psychiatric: Patient denies memory problems Allergic/Immunologic: Patient denies recent allergic reaction(s) Hematologic/Lymphatic: Patient denies bleeding tendencies Endocrine: Patient denies heat/cold intolerance  GU: As per HPI.  OBJECTIVE Vitals:   07/28/22 1107  BP: 107/64  Pulse: 80  Temp: 98.4 F (36.9 C)   There is no height or weight on file to calculate BMI.  Physical Examination  Constitutional: No obvious distress; patient is non-toxic appearing  Cardiovascular: No visible lower extremity edema.  Respiratory: The patient does not have audible wheezing/stridor; respirations do not appear labored  Gastrointestinal: Abdomen non-distended Musculoskeletal: Normal ROM of UEs  Skin: No obvious rashes/open sores  Neurologic: CN 2-12 grossly intact Psychiatric: Answered questions appropriately with normal affect  Hematologic/Lymphatic/Immunologic: No obvious bruises or sites of spontaneous bleeding  UA: negative  PVR: 303 ml  ASSESSMENT Recurrent UTI - Plan: Urinalysis, Routine w reflex microscopic, BLADDER SCAN AMB NON-IMAGING, PR COMPLEX UROFLOMETRY, cephALEXin (KEFLEX) 250 MG capsule  Benign prostatic hyperplasia with incomplete bladder emptying - Plan: Urinalysis, Routine w reflex microscopic, BLADDER SCAN AMB NON-IMAGING, PR COMPLEX UROFLOMETRY, tamsulosin (FLOMAX) 0.4 MG CAPS capsule, cephALEXin (KEFLEX) 250 MG capsule  Incomplete bladder emptying - Plan: tamsulosin (FLOMAX) 0.4 MG CAPS capsule, cephALEXin (KEFLEX) 250 MG capsule  We discussed the possible etiologies of recurrent UTls including ascending infection; transmural infection that has been treated incompletely; urinary tract stones; incomplete bladder emptying with urinary stasis; kidney or bladder tumor; urethral diverticulum. His UTIs are most likely secondary to BPH with bladder outlet obstruction and incomplete bladder emptying.   To optimize  bladder emptying we discussed options including increasing Flomax to 2x/day, clean intermittent catheterization (CIC), and cystoscopy for for further evaluation / surgical consideration.   For UTI prevention, we discussed options including: Increase fluid intake (>1.5 liters/day) Marily Memos, Utiva, or TheraCran One (supplement derived from cranberries, which have proanthocyanidin) D-mannose powder Vitamin C supplement to acidify urine Daily low dose preventative antibiotic  Patient was advised that if/when UTI-like symptoms occur they can call our office to speak with a nurse, who can place an order for urinalysis (with reflex to urine culture). They may then proceed to a [facility] to provide their urine sample. This is required for evaluation in order for their Urology provider to make informed treatment recommendation(s), which may or may not  include an antibiotic prescription.  He elected to proceed with Flomax 2x/day, daily low dose Keflex for UTI prophylaxis, and cystoscopy for surgical consideration.   Pt verbalized understanding and agreement. All questions were answered.  PLAN Advised the following: Flomax 0.4 mg twice per day.  Keflex 250 mg daily. Return for 1st available cystoscopy with any urology MD (patient may prefer [place] re: proximity to home).  Orders Placed This Encounter  Procedures   Urinalysis, Routine w reflex microscopic   PR COMPLEX UROFLOMETRY   BLADDER SCAN AMB NON-IMAGING    It has been explained that the patient is to follow regularly with their PCP in addition to all other providers involved in their care and to follow instructions provided by these respective offices. Patient advised to contact urology clinic if any urologic-pertaining questions, concerns, new symptoms or problems arise in the interim period.  Patient Instructions  Recommendations regarding UTI prevention / management:  When UTI symptoms occur: Call urology office to  request order for urine culture. We recommend waiting for urine culture result prior to use of any antibiotics.  For bladder pain/ burning with urination: Over the counter Pyridium (phenazopyridine) as needed (commonly known under the "AZO" brand). No more than 3 days consecutively at a time due to risk for methemoglobinemia, liver function issues, and bone health damage with long term use of Pyridium.  Routine use for UTI prevention: Adequate fluid intake (>1.5 liters/day) to flush out the urinary tract. - Go to the bathroom to urinate every 4-6 hours while awake to minimize urinary stasis / bacterial overgrowth in the bladder. - Proanthocyanidin (PAC) supplement 36 mg daily; must be soluble (insoluble form of PAC will be ineffective). Recommended brand: Ellura. This is an over-the-counter supplement (often must be found/ purchased online) supplement derived from cranberries with concentrated active component: Proanthocyanidin (PAC) 36 mg daily. Decreases bacterial adherence to bladder lining. Not recommended for patients with interstitial cystitis due to acidity. - D-mannose powder (2 grams daily). This is an over-the-counter supplement which decreases bacterial adherence to bladder lining (it is a sugar that inhibits bacterial adherence to urothelial cells by binding to the pili of enteric bacteria). Take as per manufacturer recommendation. Can be used as an alternative or in addition to the concentrated cranberry supplement. Not recommended for diabetic patients due to sugar content. - Vitamin C supplement to acidify urine to minimize bacterial growth. Not recommended for patients with interstitial cystitis due to acidity.  Note for patients with diabetes: You may read about D-mannose powder for UTI prevention. That is an over the-counter supplement which decreases bacterial adherence to bladder lining. I would NOT advise that for you as a person with diabetes due to its sugar  content.  Electronically signed by:  Donnita Falls, MSN, FNP-C, CUNP 07/28/2022 12:12 PM

## 2022-07-28 ENCOUNTER — Ambulatory Visit (INDEPENDENT_AMBULATORY_CARE_PROVIDER_SITE_OTHER): Payer: Medicare Other | Admitting: Urology

## 2022-07-28 ENCOUNTER — Encounter: Payer: Self-pay | Admitting: Urology

## 2022-07-28 VITALS — BP 107/64 | HR 80 | Temp 98.4°F

## 2022-07-28 DIAGNOSIS — R339 Retention of urine, unspecified: Secondary | ICD-10-CM

## 2022-07-28 DIAGNOSIS — Z87440 Personal history of urinary (tract) infections: Secondary | ICD-10-CM | POA: Diagnosis not present

## 2022-07-28 DIAGNOSIS — R3914 Feeling of incomplete bladder emptying: Secondary | ICD-10-CM | POA: Diagnosis not present

## 2022-07-28 DIAGNOSIS — N401 Enlarged prostate with lower urinary tract symptoms: Secondary | ICD-10-CM | POA: Diagnosis not present

## 2022-07-28 DIAGNOSIS — N390 Urinary tract infection, site not specified: Secondary | ICD-10-CM

## 2022-07-28 LAB — URINALYSIS, ROUTINE W REFLEX MICROSCOPIC
Bilirubin, UA: NEGATIVE
Glucose, UA: NEGATIVE
Ketones, UA: NEGATIVE
Leukocytes,UA: NEGATIVE
Nitrite, UA: NEGATIVE
Protein,UA: NEGATIVE
RBC, UA: NEGATIVE
Specific Gravity, UA: 1.02 (ref 1.005–1.030)
Urobilinogen, Ur: 1 mg/dL (ref 0.2–1.0)
pH, UA: 6 (ref 5.0–7.5)

## 2022-07-28 LAB — BLADDER SCAN AMB NON-IMAGING: Scan Result: 303

## 2022-07-28 MED ORDER — CEPHALEXIN 250 MG PO CAPS
250.0000 mg | ORAL_CAPSULE | Freq: Every day | ORAL | 5 refills | Status: DC
Start: 2022-07-28 — End: 2022-12-09

## 2022-07-28 MED ORDER — TAMSULOSIN HCL 0.4 MG PO CAPS
0.4000 mg | ORAL_CAPSULE | Freq: Two times a day (BID) | ORAL | 3 refills | Status: DC
Start: 2022-07-28 — End: 2023-06-09

## 2022-07-28 NOTE — Patient Instructions (Addendum)
Recommendations regarding UTI prevention / management:  When UTI symptoms occur: Call urology office to request order for urine culture. We recommend waiting for urine culture result prior to use of any antibiotics.  For bladder pain/ burning with urination: Over the counter Pyridium (phenazopyridine) as needed (commonly known under the "AZO" brand). No more than 3 days consecutively at a time due to risk for methemoglobinemia, liver function issues, and bone health damage with long term use of Pyridium.  Routine use for UTI prevention: Adequate fluid intake (>1.5 liters/day) to flush out the urinary tract. - Go to the bathroom to urinate every 4-6 hours while awake to minimize urinary stasis / bacterial overgrowth in the bladder. - Proanthocyanidin (PAC) supplement 36 mg daily; must be soluble (insoluble form of PAC will be ineffective). Recommended brand: Ellura. This is an over-the-counter supplement (often must be found/ purchased online) supplement derived from cranberries with concentrated active component: Proanthocyanidin (PAC) 36 mg daily. Decreases bacterial adherence to bladder lining. Not recommended for patients with interstitial cystitis due to acidity. - D-mannose powder (2 grams daily). This is an over-the-counter supplement which decreases bacterial adherence to bladder lining (it is a sugar that inhibits bacterial adherence to urothelial cells by binding to the pili of enteric bacteria). Take as per manufacturer recommendation. Can be used as an alternative or in addition to the concentrated cranberry supplement. Not recommended for diabetic patients due to sugar content. - Vitamin C supplement to acidify urine to minimize bacterial growth. Not recommended for patients with interstitial cystitis due to acidity.  Note for patients with diabetes: You may read about D-mannose powder for UTI prevention. That is an over the-counter supplement which decreases bacterial adherence to bladder  lining. I would NOT advise that for you as a person with diabetes due to its sugar content.

## 2022-07-28 NOTE — Progress Notes (Signed)
Uroflow  Peak Flow: 17ml Average Flow: 9ml Voided Volume: 23ml Voiding Time: 3sec Flow Time: 3sec Time to Peak Flow: 1sec  PVR Volume: 303 ml

## 2022-08-05 ENCOUNTER — Encounter: Payer: Self-pay | Admitting: Surgery

## 2022-08-08 ENCOUNTER — Ambulatory Visit (INDEPENDENT_AMBULATORY_CARE_PROVIDER_SITE_OTHER): Payer: Medicare Other | Admitting: Urology

## 2022-08-08 ENCOUNTER — Encounter: Payer: Self-pay | Admitting: Urology

## 2022-08-08 ENCOUNTER — Ambulatory Visit: Payer: Medicare Other | Admitting: Urology

## 2022-08-08 VITALS — BP 118/67 | HR 80 | Ht 70.5 in | Wt 180.0 lb

## 2022-08-08 DIAGNOSIS — R3914 Feeling of incomplete bladder emptying: Secondary | ICD-10-CM | POA: Diagnosis not present

## 2022-08-08 DIAGNOSIS — N390 Urinary tract infection, site not specified: Secondary | ICD-10-CM | POA: Diagnosis not present

## 2022-08-08 DIAGNOSIS — R339 Retention of urine, unspecified: Secondary | ICD-10-CM

## 2022-08-08 DIAGNOSIS — N401 Enlarged prostate with lower urinary tract symptoms: Secondary | ICD-10-CM | POA: Diagnosis not present

## 2022-08-08 DIAGNOSIS — Z87440 Personal history of urinary (tract) infections: Secondary | ICD-10-CM

## 2022-08-08 MED ORDER — FINASTERIDE 5 MG PO TABS: 5.0000 mg | ORAL_TABLET | Freq: Every day | ORAL | 3 refills | Status: DC

## 2022-08-08 MED ORDER — SULFAMETHOXAZOLE-TRIMETHOPRIM 800-160 MG PO TABS
1.0000 | ORAL_TABLET | Freq: Once | ORAL | Status: AC
Start: 2022-08-08 — End: 2022-08-08
  Administered 2022-08-08: 1 via ORAL

## 2022-08-08 NOTE — Progress Notes (Signed)
Assessment: 1. Recurrent UTI   2. Benign prostatic hyperplasia with incomplete bladder emptying   3. Incomplete bladder emptying     Plan: Continue tamsulosin 0.8 mg daily Today I discussed additional medical management options with him and he would like to begin combination medical therapy. Rx: Finasteride 5 mg daily Nature of medication including proper utilization as well as potential adverse events and side effects reviewed. Patient will continue low-dose suppression for the next 2 to 3 months with Keflex 250 nightly  Patient will follow-up in 4 months for recheck with symptom assessment and residual urine determination.  Chief Complaint: Recurrent UTI  HPI: Jason Abbott. is a 77 y.o. male who presents for continued evaluation of recurrent urinary tract infection scheduled today for cystoscopy. Please see note 07/28/2022 by Evette Georges, FNP at the time of recent evaluation for detailed history and exam. Patient has longstanding BPH/lower urinary tract symptoms with incomplete bladder emptying felt to be predisposing him to UTIs. He is currently taking tamsulosin 0.8 mg daily which was recently changed and he is also on Keflex 250 nightly for low-dose suppression.  He is accompanied today by his wife.   Portions of the above documentation were copied from a prior visit for review purposes only.  Allergies: Allergies  Allergen Reactions   Levaquin [Levofloxacin] Other (See Comments)    Due to medical hx    PMH: Past Medical History:  Diagnosis Date   Aberrant right subclavian artery    Aortic arch aneurysm (HCC)    Arthritis    Bowel obstruction (HCC)    CLL (chronic lymphocytic leukemia) (HCC)    COVID 2021   hospitalized for it   Diverticul disease small and large intestine, no perforati or abscess    GERD (gastroesophageal reflux disease)    uses tums as needed   H/O urinary infection    Pneumonia 09/22/2021   Shingles outbreak 10/03/2013   Stroke  (HCC) 01/2019    PSH: Past Surgical History:  Procedure Laterality Date   AORTIC ARCH DEBRANCHING N/A 01/10/2022   Procedure: AORTIC ARCH DEBRANCHING WITH 16 X 8 X 10 MM HEMASHIELD GOLD GRAFT;  Surgeon: Alleen Borne, MD;  Location: [facility] OR;  Service: Open Heart Surgery;  Laterality: N/A;   CAROTID-SUBCLAVIAN BYPASS GRAFT Right 12/18/2021   Procedure: RIGHT BYPASS GRAFT CAROTID-SUBCLAVIAN;  Surgeon: Nada Libman, MD;  Location: [facility] OR;  Service: Vascular;  Laterality: Right;   CARPAL TUNNEL RELEASE Right 11/25/2021   Procedure: CARPAL TUNNEL RELEASE;  Surgeon: Marlyne Beards, MD;  Location: [facility] OR;  Service: Orthopedics;  Laterality: Right;  or MAC with regional block 60   HERNIA REPAIR     MECKEL DIVERTICULUM EXCISION     ORIF FINGER / THUMB FRACTURE     TEE WITHOUT CARDIOVERSION N/A 01/10/2022   Procedure: TRANSESOPHAGEAL ECHOCARDIOGRAM (TEE);  Surgeon: Alleen Borne, MD;  Location: [facility] OR;  Service: Open Heart Surgery;  Laterality: N/A;   TENOSYNOVECTOMY Right 11/25/2021   Procedure: FLEXOR TENOSYNOVECTOMY;  Surgeon: Marlyne Beards, MD;  Location: [facility] OR;  Service: Orthopedics;  Laterality: Right;  or MAC with regional block 60   THORACIC AORTIC ENDOVASCULAR STENT GRAFT N/A 01/10/2022   Procedure: THORACIC AORTIC ENDOVASCULAR STENT GRAFT;  Surgeon: Nada Libman, MD;  Location: [facility] OR;  Service: Vascular;  Laterality: N/A;   ulna nerve surgery      SH: Social History   Tobacco Use   Smoking status: Former    Current packs/day: 0.00  Average packs/day: 0.5 packs/day for 23.0 years (11.5 ttl pk-yrs)    Types: Cigarettes    Start date: 79    Quit date: 1989    Years since quitting: 35.6   Smokeless tobacco: Never  Vaping Use   Vaping status: Never Used  Substance Use Topics   Alcohol use: Not Currently    Comment: rarely   Drug use: No    ROS: Constitutional:  Negative for fever, chills, weight loss CV: Negative for chest pain, previous MI,  hypertension Respiratory:  Negative for shortness of breath, wheezing, sleep apnea, frequent cough GI:  Negative for nausea, vomiting, bloody stool, GERD  PE: BP 118/67   Pulse 80   Ht 5' 10.5" (1.791 m)   Wt 180 lb (81.6 kg)   BMI 25.46 kg/m  GENERAL APPEARANCE:  Well appearing, well developed, well nourished, NAD  GU: Normal external genitalia DRE: Normal sphincter tone; prostate is approximately 30 to 40 g without evidence of nodules or induration   Results: UA negative   PROCEDURE:  CYSTOSCOPY  INDICATION:  RECURRENT UTI  Description of procedure: The patient was brought to the procedure room where he was correctly identified.  The procedure was discussed with the patient and informed consent was obtained.  Flexible cystoscopy was then performed. Anterior urethra-normal Prostatic urethra-reveals lateral lobe enlargement and a mildly elevated bladder neck without median lobe or intravesical component.  Bladder-normal ureteral openings.  No mucosal lesions appreciated.  Mild bladder trabeculation.  Procedure was well-tolerated-no complications.

## 2022-08-08 NOTE — Addendum Note (Signed)
Addended by: Malva Cogan L on: 08/08/2022 11:50 AM   Modules accepted: Orders

## 2022-08-08 NOTE — Addendum Note (Signed)
Addended by: Marcha Solders C on: 08/08/2022 11:46 AM   Modules accepted: Orders

## 2022-08-15 ENCOUNTER — Encounter: Payer: Self-pay | Admitting: Hematology & Oncology

## 2022-08-19 ENCOUNTER — Inpatient Hospital Stay (HOSPITAL_BASED_OUTPATIENT_CLINIC_OR_DEPARTMENT_OTHER): Payer: Medicare Other | Admitting: Family

## 2022-08-19 ENCOUNTER — Inpatient Hospital Stay: Payer: Medicare Other | Attending: Hematology & Oncology

## 2022-08-19 ENCOUNTER — Encounter: Payer: Self-pay | Admitting: Family

## 2022-08-19 ENCOUNTER — Other Ambulatory Visit: Payer: Self-pay

## 2022-08-19 VITALS — BP 114/84 | HR 63 | Resp 18 | Ht 70.0 in | Wt 186.8 lb

## 2022-08-19 DIAGNOSIS — Z7952 Long term (current) use of systemic steroids: Secondary | ICD-10-CM | POA: Insufficient documentation

## 2022-08-19 DIAGNOSIS — R590 Localized enlarged lymph nodes: Secondary | ICD-10-CM | POA: Diagnosis not present

## 2022-08-19 DIAGNOSIS — D649 Anemia, unspecified: Secondary | ICD-10-CM | POA: Diagnosis not present

## 2022-08-19 DIAGNOSIS — D591 Autoimmune hemolytic anemia, unspecified: Secondary | ICD-10-CM

## 2022-08-19 DIAGNOSIS — Z7982 Long term (current) use of aspirin: Secondary | ICD-10-CM | POA: Diagnosis not present

## 2022-08-19 DIAGNOSIS — R161 Splenomegaly, not elsewhere classified: Secondary | ICD-10-CM | POA: Insufficient documentation

## 2022-08-19 DIAGNOSIS — D509 Iron deficiency anemia, unspecified: Secondary | ICD-10-CM | POA: Diagnosis not present

## 2022-08-19 DIAGNOSIS — C9110 Chronic lymphocytic leukemia of B-cell type not having achieved remission: Secondary | ICD-10-CM | POA: Insufficient documentation

## 2022-08-19 LAB — CBC WITH DIFFERENTIAL (CANCER CENTER ONLY)
Abs Immature Granulocytes: 0 10*3/uL (ref 0.00–0.07)
Basophils Absolute: 0 10*3/uL (ref 0.0–0.1)
Basophils Relative: 0 %
Eosinophils Absolute: 0.2 10*3/uL (ref 0.0–0.5)
Eosinophils Relative: 1 %
HCT: 23.8 % — ABNORMAL LOW (ref 39.0–52.0)
Hemoglobin: 7.4 g/dL — ABNORMAL LOW (ref 13.0–17.0)
Lymphocytes Relative: 93 %
Lymphs Abs: 22.6 10*3/uL — ABNORMAL HIGH (ref 0.7–4.0)
MCH: 35.9 pg — ABNORMAL HIGH (ref 26.0–34.0)
MCHC: 31.1 g/dL (ref 30.0–36.0)
MCV: 115.5 fL — ABNORMAL HIGH (ref 80.0–100.0)
Monocytes Absolute: 0 10*3/uL — ABNORMAL LOW (ref 0.1–1.0)
Monocytes Relative: 0 %
Neutro Abs: 1.5 10*3/uL — ABNORMAL LOW (ref 1.7–7.7)
Neutrophils Relative %: 6 %
Platelet Count: 109 10*3/uL — ABNORMAL LOW (ref 150–400)
RBC: 2.06 MIL/uL — ABNORMAL LOW (ref 4.22–5.81)
RDW: 15.9 % — ABNORMAL HIGH (ref 11.5–15.5)
Smear Review: NORMAL
WBC Count: 24.3 10*3/uL — ABNORMAL HIGH (ref 4.0–10.5)
nRBC: 0 % (ref 0.0–0.2)

## 2022-08-19 LAB — CMP (CANCER CENTER ONLY)
ALT: 23 U/L (ref 0–44)
AST: 41 U/L (ref 15–41)
Albumin: 4 g/dL (ref 3.5–5.0)
Alkaline Phosphatase: 73 U/L (ref 38–126)
Anion gap: 8 (ref 5–15)
BUN: 20 mg/dL (ref 8–23)
CO2: 27 mmol/L (ref 22–32)
Calcium: 8.6 mg/dL — ABNORMAL LOW (ref 8.9–10.3)
Chloride: 103 mmol/L (ref 98–111)
Creatinine: 0.95 mg/dL (ref 0.61–1.24)
GFR, Estimated: >60 mL/min (ref >60)
Glucose, Bld: 109 mg/dL — ABNORMAL HIGH (ref 70–99)
Potassium: 4.9 mmol/L (ref 3.5–5.1)
Sodium: 138 mmol/L (ref 135–145)
Total Bilirubin: 2.1 mg/dL — ABNORMAL HIGH (ref 0.3–1.2)
Total Protein: 6 g/dL — ABNORMAL LOW (ref 6.5–8.1)

## 2022-08-19 LAB — SAVE SMEAR(SSMR), FOR PROVIDER SLIDE REVIEW

## 2022-08-19 LAB — PREPARE RBC (CROSSMATCH)

## 2022-08-19 LAB — TYPE AND SCREEN
ABO/RH(D): O POS
Antibody Screen: POSITIVE

## 2022-08-19 NOTE — Progress Notes (Unsigned)
Hematology and Oncology Follow Up Visit  Jason Abbott [id number] [birth year] 77 y.o. 08/19/2022   Principle Diagnosis:  Stage A CLL   Current Therapy:        Observation    Interim History:  [name] is ***   ECOG Performance Status: {CHL ONC ECOG WU:[id number]}  Medications:  Allergies as of 08/19/2022       Reactions   Levaquin [levofloxacin] Other (See Comments)   Due to medical hx        Medication List        Accurate as of August 19, 2022  2:49 PM. If you have any questions, ask your nurse or doctor.          aspirin EC 81 MG tablet Take 1 tablet (81 mg total) by mouth daily at 6 (six) AM. Swallow whole.   AZO CRANBERRY PO Take 1 each by mouth in the morning.   cephALEXin 250 MG capsule Commonly known as: KEFLEX Take 1 capsule (250 mg total) by mouth daily.   finasteride 5 MG tablet Commonly known as: PROSCAR Take 1 tablet (5 mg total) by mouth daily.   MULTIVITAMIN GUMMIES ADULTS PO Take 2 tablets by mouth in the morning.   tamsulosin 0.4 MG Caps capsule Commonly known as: FLOMAX Take 1 capsule (0.4 mg total) by mouth in the morning and at bedtime.   VITAMIN B 12 PO Take 1 tablet by mouth in the morning.        Allergies:  Allergies  Allergen Reactions   Levaquin [Levofloxacin] Other (See Comments)    Due to medical hx    Past Medical History, Surgical history, Social history, and Family History were reviewed and updated.  Review of Systems: All other 10 point review of systems is negative.   Physical Exam:  height is 5\' 10"  (1.778 m) and weight is 186 lb 12.8 oz (84.7 kg). His blood pressure is 114/84 and his pulse is 63. His respiration is 18 and oxygen saturation is 100%.   Wt Readings from Last 3 Encounters:  08/19/22 186 lb 12.8 oz (84.7 kg)  08/08/22 180 lb (81.6 kg)  06/11/22 184 lb 12 oz (83.8 kg)    Ocular: Sclerae unicteric, pupils equal, round and reactive to light Ear-nose-throat: Oropharynx clear,  dentition fair Lymphatic: No cervical or supraclavicular adenopathy Lungs no rales or rhonchi, good excursion bilaterally Heart regular rate and rhythm, no murmur appreciated Abd soft, nontender, positive bowel sounds MSK no focal spinal tenderness, no joint edema Neuro: non-focal, well-oriented, appropriate affect Breasts:   Lab Results  Component Value Date   WBC 24.3 (H) 08/19/2022   HGB 7.4 (L) 08/19/2022   HCT 23.8 (L) 08/19/2022   MCV 115.5 (H) 08/19/2022   PLT 109 (L) 08/19/2022   No results found for: "FERRITIN", "IRON", "TIBC", "UIBC", "IRONPCTSAT" Lab Results  Component Value Date   RBC 2.06 (L) 08/19/2022   Lab Results  Component Value Date   KPAFRELGTCHN 12.8 08/26/2021   LAMBDASER 24.9 08/26/2021   KAPLAMBRATIO 0.51 08/26/2021   Lab Results  Component Value Date   IGGSERUM 808 08/26/2021   IGA 149 08/26/2021   IGMSERUM 39 08/26/2021   No results found for: "TOTALPROTELP", "ALBUMINELP", "A1GS", "A2GS", "BETS", "BETA2SER", "GAMS", "MSPIKE", "SPEI"   Chemistry      Component Value Date/Time   NA 135 01/31/2022 1405   K 4.0 01/31/2022 1405   CL 101 01/31/2022 1405   CO2 26 01/31/2022 1405   BUN 13 01/31/2022 1405  CREATININE 1.16 01/31/2022 1405   CREATININE 1.04 08/26/2021 0943   CREATININE 1.03 08/15/2021 0000      Component Value Date/Time   CALCIUM 8.8 (L) 01/31/2022 1405   ALKPHOS 78 01/07/2022 0942   AST 22 01/07/2022 0942   AST 21 08/26/2021 0943   ALT 15 01/07/2022 0942   ALT 19 08/26/2021 0943   BILITOT 0.7 01/07/2022 0942   BILITOT 0.6 08/26/2021 0943       Impression and Plan: [name] is ***  Eileen Stanford, NP 8/13/20242:49 PM

## 2022-08-20 ENCOUNTER — Ambulatory Visit: Payer: Medicare Other | Admitting: Hematology & Oncology

## 2022-08-20 ENCOUNTER — Encounter: Payer: Self-pay | Admitting: Hematology & Oncology

## 2022-08-20 ENCOUNTER — Telehealth: Payer: Self-pay | Admitting: *Deleted

## 2022-08-20 ENCOUNTER — Inpatient Hospital Stay: Payer: Medicare Other

## 2022-08-20 ENCOUNTER — Inpatient Hospital Stay (HOSPITAL_BASED_OUTPATIENT_CLINIC_OR_DEPARTMENT_OTHER): Payer: Medicare Other | Admitting: Hematology & Oncology

## 2022-08-20 ENCOUNTER — Other Ambulatory Visit: Payer: Self-pay

## 2022-08-20 VITALS — BP 114/53 | HR 70 | Temp 97.5°F | Resp 18 | Ht 70.5 in | Wt 185.8 lb

## 2022-08-20 DIAGNOSIS — R590 Localized enlarged lymph nodes: Secondary | ICD-10-CM | POA: Diagnosis not present

## 2022-08-20 DIAGNOSIS — C9110 Chronic lymphocytic leukemia of B-cell type not having achieved remission: Secondary | ICD-10-CM

## 2022-08-20 DIAGNOSIS — Z7952 Long term (current) use of systemic steroids: Secondary | ICD-10-CM | POA: Diagnosis not present

## 2022-08-20 DIAGNOSIS — D591 Autoimmune hemolytic anemia, unspecified: Secondary | ICD-10-CM | POA: Diagnosis not present

## 2022-08-20 DIAGNOSIS — Z7982 Long term (current) use of aspirin: Secondary | ICD-10-CM | POA: Diagnosis not present

## 2022-08-20 DIAGNOSIS — R161 Splenomegaly, not elsewhere classified: Secondary | ICD-10-CM | POA: Diagnosis not present

## 2022-08-20 MED ORDER — FAMCICLOVIR 250 MG PO TABS: 250.0000 mg | ORAL_TABLET | Freq: Two times a day (BID) | ORAL | 5 refills | Status: DC

## 2022-08-20 MED ORDER — PREDNISONE 20 MG PO TABS: 80.0000 mg | ORAL_TABLET | Freq: Every day | ORAL | 4 refills | Status: DC

## 2022-08-20 MED ORDER — FLUCONAZOLE 100 MG PO TABS: 100.0000 mg | ORAL_TABLET | Freq: Every day | ORAL | 4 refills | Status: DC

## 2022-08-20 NOTE — Progress Notes (Signed)
Hematology and Oncology Follow Up Visit  Jason Abbott [id number] 1945/10/20 77 y.o. 08/20/2022   Principle Diagnosis:  Stage C CLL -autoimmune hemolytic anemia  Current Therapy:   Prednisone 80 mg p.o. daily-started on 08/20/2022   Interim History:  [name] is here today for follow-up.  It has been quite a while since we last saw him.  Unfortunate, I think that we have a problem now with his CLL.  He is quite anemic now.  His hemoglobin is 7.4.  I had to believe that this is to be an autoimmune hemolytic anemia from the CLL.  He has warm autoantibodies on the blood typing.  We have to get him in for blood transfusion.  I need to start him on steroids.  He probably needs to have a CT scan done.  I think his last CT scan was done back in May or so.  This more so for follow-up of his cardiac surgery and thoracic aortic aneurysm repair.  I suspect that we probably will have to get him on actual treatment for the CLL.  I probably would consider using acalabrutinib with venetoclax.  I think this would be a reasonable way to go.  Again, he has autoimmune hemolytic anemia.  I will get him on prednisone at 80 mg a day.  We will then have to taper him down.  He feels okay.  Does feel somewhat tired.  He has had no swollen lymph nodes.  On exam, I can feel some lymph nodes in the axilla.  He has had no change in bowel or bladder habits.  He has had no nausea or vomiting.  He does get somewhat short of breath with exertion.  Clinically, I would have said that his performance status is probably ECOG 1.  Medications:  Allergies as of 08/20/2022       Reactions   Levaquin [levofloxacin] Other (See Comments)   Due to medical hx        Medication List        Accurate as of August 20, 2022  5:00 PM. If you have any questions, ask your nurse or doctor.          aspirin EC 81 MG tablet Take 1 tablet (81 mg total) by mouth daily at 6 (six) AM. Swallow whole.   AZO CRANBERRY  PO Take 1 each by mouth in the morning.   cephALEXin 250 MG capsule Commonly known as: KEFLEX Take 1 capsule (250 mg total) by mouth daily.   famciclovir 250 MG tablet Commonly known as: FAMVIR Take 1 tablet (250 mg total) by mouth 2 (two) times daily. Started by: Josph Macho   finasteride 5 MG tablet Commonly known as: PROSCAR Take 1 tablet (5 mg total) by mouth daily.   fluconazole 100 MG tablet Commonly known as: DIFLUCAN Take 1 tablet (100 mg total) by mouth daily. Started by: Josph Macho   MULTIVITAMIN GUMMIES ADULTS PO Take 2 tablets by mouth in the morning.   predniSONE 20 MG tablet Commonly known as: DELTASONE Take 4 tablets (80 mg total) by mouth daily with breakfast. Started by: Josph Macho   tamsulosin 0.4 MG Caps capsule Commonly known as: FLOMAX Take 1 capsule (0.4 mg total) by mouth in the morning and at bedtime.   VITAMIN B 12 PO Take 1 tablet by mouth in the morning.        Allergies:  Allergies  Allergen Reactions   Levaquin [Levofloxacin] Other (See Comments)  Due to medical hx    Past Medical History, Surgical history, Social history, and Family History were reviewed and updated.  Review of Systems: Review of Systems  Constitutional: Negative.   HENT: Negative.    Eyes: Negative.   Respiratory: Negative.    Cardiovascular: Negative.   Gastrointestinal: Negative.   Genitourinary: Negative.   Musculoskeletal: Negative.   Skin: Negative.   Neurological: Negative.   Endo/Heme/Allergies: Negative.   Psychiatric/Behavioral: Negative.       Physical Exam:  height is 5' 10.5" (1.791 m) and weight is 185 lb 12.8 oz (84.3 kg). His oral temperature is 97.5 F (36.4 C) (abnormal). His blood pressure is 114/53 (abnormal) and his pulse is 70. His respiration is 18 and oxygen saturation is 100%.   Wt Readings from Last 3 Encounters:  08/20/22 185 lb 12.8 oz (84.3 kg)  08/19/22 186 lb 12.8 oz (84.7 kg)  08/08/22 180 lb (81.6  kg)    Physical Exam Vitals reviewed.  HENT:     Head: Normocephalic and atraumatic.  Eyes:     Pupils: Pupils are equal, round, and reactive to light.  Cardiovascular:     Rate and Rhythm: Normal rate and regular rhythm.     Heart sounds: Normal heart sounds.  Pulmonary:     Effort: Pulmonary effort is normal.     Breath sounds: Normal breath sounds.  Abdominal:     General: Bowel sounds are normal.     Palpations: Abdomen is soft.  Musculoskeletal:        General: No tenderness or deformity. Normal range of motion.     Cervical back: Normal range of motion.  Lymphadenopathy:     Cervical: No cervical adenopathy.  Skin:    General: Skin is warm and dry.     Findings: No erythema or rash.  Neurological:     Mental Status: He is alert and oriented to person, place, and time.  Psychiatric:        Behavior: Behavior normal.        Thought Content: Thought content normal.        Judgment: Judgment normal.      Lab Results  Component Value Date   WBC 24.3 (H) 08/19/2022   HGB 7.4 (L) 08/19/2022   HCT 23.8 (L) 08/19/2022   MCV 115.5 (H) 08/19/2022   PLT 109 (L) 08/19/2022   No results found for: "FERRITIN", "IRON", "TIBC", "UIBC", "IRONPCTSAT" Lab Results  Component Value Date   RBC 2.06 (L) 08/19/2022   Lab Results  Component Value Date   KPAFRELGTCHN 12.8 08/26/2021   LAMBDASER 24.9 08/26/2021   KAPLAMBRATIO 0.51 08/26/2021   Lab Results  Component Value Date   IGGSERUM 808 08/26/2021   IGA 149 08/26/2021   IGMSERUM 39 08/26/2021   No results found for: "TOTALPROTELP", "ALBUMINELP", "A1GS", "A2GS", "BETS", "BETA2SER", "GAMS", "MSPIKE", "SPEI"   Chemistry      Component Value Date/Time   NA 138 08/19/2022 1419   K 4.9 08/19/2022 1419   CL 103 08/19/2022 1419   CO2 27 08/19/2022 1419   BUN 20 08/19/2022 1419   CREATININE 0.95 08/19/2022 1419   CREATININE 1.03 08/15/2021 0000      Component Value Date/Time   CALCIUM 8.6 (L) 08/19/2022 1419   ALKPHOS  73 08/19/2022 1419   AST 41 08/19/2022 1419   ALT 23 08/19/2022 1419   BILITOT 2.1 (H) 08/19/2022 1419       Impression and Plan: [name] is a very pleasant 77  yo caucasian gentleman with stage C CLL.  I think he is clearly has progressed.  I think the autoimmune hemolytic anemia is certainly an indicator of this.  Again, we will have him come in this Friday for a blood transfusion.  He will also need to be on Famvir and Diflucan for the prednisone.  I told him to take the prednisone in the morning with breakfast.  He does take Tums on occasion for heartburn.  I would like to get him back here to see me in about 2 weeks or so.  Hopefully, we will see that the hemolytic anemia is improving.      Josph Macho, MD 8/14/20245:00 PM

## 2022-08-20 NOTE — Telephone Encounter (Signed)
Caller: Flossie Buffy  Concern: Pt. Wife called concerned of recent lab results of blood work recently done by [facility]. Pt has HBG of 7.4. This past weekend pt. Was very active in pushing and pulling per wife. Concern of possible leak from surgery and asking for CT scan. Cancer center did order blood transfusion which will be given this Friday. Cancer center did test stool which was negative for blood and pending result for blood smear. Talked with Tiffany from Cancer center in regard to pt. Concern. blood smear should result today and she will follow up with pt and let them know if CT is needed.  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

## 2022-08-21 ENCOUNTER — Other Ambulatory Visit: Payer: Self-pay | Admitting: *Deleted

## 2022-08-21 ENCOUNTER — Encounter: Payer: Self-pay | Admitting: *Deleted

## 2022-08-21 ENCOUNTER — Other Ambulatory Visit: Payer: Medicare Other

## 2022-08-21 DIAGNOSIS — R161 Splenomegaly, not elsewhere classified: Secondary | ICD-10-CM | POA: Diagnosis not present

## 2022-08-21 DIAGNOSIS — D591 Autoimmune hemolytic anemia, unspecified: Secondary | ICD-10-CM | POA: Diagnosis not present

## 2022-08-21 DIAGNOSIS — D649 Anemia, unspecified: Secondary | ICD-10-CM

## 2022-08-21 DIAGNOSIS — Z7982 Long term (current) use of aspirin: Secondary | ICD-10-CM | POA: Diagnosis not present

## 2022-08-21 DIAGNOSIS — R590 Localized enlarged lymph nodes: Secondary | ICD-10-CM | POA: Diagnosis not present

## 2022-08-21 DIAGNOSIS — C9110 Chronic lymphocytic leukemia of B-cell type not having achieved remission: Secondary | ICD-10-CM

## 2022-08-21 DIAGNOSIS — Z7952 Long term (current) use of systemic steroids: Secondary | ICD-10-CM | POA: Diagnosis not present

## 2022-08-21 NOTE — Progress Notes (Signed)
Message received from blood bank to notify Dr. Myna Hidalgo that patient has a warm auto antibody interfering with blood bank testing and that pt will need to get the least incompatible blood. Dr. Myna Hidalgo is ok with pt receiving the least incompatible blood and would like for pt to take Tylenol 650 mg PO, Pepcid 40 mg IV, Solumedrol 80 mg IV and NO Benadryl before blood transfusion. Pharmacy notified and orders entered.

## 2022-08-22 ENCOUNTER — Inpatient Hospital Stay: Payer: Medicare Other

## 2022-08-22 DIAGNOSIS — C9110 Chronic lymphocytic leukemia of B-cell type not having achieved remission: Secondary | ICD-10-CM

## 2022-08-22 DIAGNOSIS — R590 Localized enlarged lymph nodes: Secondary | ICD-10-CM | POA: Diagnosis not present

## 2022-08-22 DIAGNOSIS — D591 Autoimmune hemolytic anemia, unspecified: Secondary | ICD-10-CM | POA: Diagnosis not present

## 2022-08-22 DIAGNOSIS — Z7952 Long term (current) use of systemic steroids: Secondary | ICD-10-CM | POA: Diagnosis not present

## 2022-08-22 DIAGNOSIS — R161 Splenomegaly, not elsewhere classified: Secondary | ICD-10-CM | POA: Diagnosis not present

## 2022-08-22 DIAGNOSIS — D649 Anemia, unspecified: Secondary | ICD-10-CM

## 2022-08-22 DIAGNOSIS — Z7982 Long term (current) use of aspirin: Secondary | ICD-10-CM | POA: Diagnosis not present

## 2022-08-22 MED ORDER — FAMOTIDINE IN NACL 20-0.9 MG/50ML-% IV SOLN
20.0000 mg | INTRAVENOUS | Status: AC
  Administered 2022-08-22 (×2): 20 mg via INTRAVENOUS
  Filled 2022-08-22: qty 50

## 2022-08-22 MED ORDER — ACETAMINOPHEN 325 MG PO TABS
650.0000 mg | ORAL_TABLET | Freq: Once | ORAL | Status: AC
  Administered 2022-08-22: 650 mg via ORAL
  Filled 2022-08-22: qty 2

## 2022-08-22 MED ORDER — FUROSEMIDE 10 MG/ML IJ SOLN: 20.0000 mg | Freq: Once | INTRAMUSCULAR | Status: DC

## 2022-08-22 MED ORDER — DIPHENHYDRAMINE HCL 25 MG PO CAPS
25.0000 mg | ORAL_CAPSULE | Freq: Once | ORAL | Status: AC
  Administered 2022-08-22: 25 mg via ORAL
  Filled 2022-08-22: qty 1

## 2022-08-22 MED ORDER — SODIUM CHLORIDE 0.9% IV SOLUTION
250.0000 mL | Freq: Once | INTRAVENOUS | Status: AC
  Administered 2022-08-22: 250 mL via INTRAVENOUS

## 2022-08-22 MED ORDER — METHYLPREDNISOLONE SODIUM SUCC 125 MG IJ SOLR
80.0000 mg | Freq: Once | INTRAMUSCULAR | Status: AC
  Administered 2022-08-22: 80 mg via INTRAVENOUS
  Filled 2022-08-22: qty 2

## 2022-08-22 NOTE — Patient Instructions (Signed)
Blood Transfusion, Adult A blood transfusion is a procedure in which you receive blood or a type of blood cell (blood component) through an IV. You may need a blood transfusion when you have a low blood count, which is a low number of any blood cell. This may result from a bleeding disorder, illness, injury, or surgery. The blood may come from a donor, or you may be able to have your own blood collected and stored (autologous blood donation) before a planned surgery. The blood given in a transfusion may be made up of different blood components. You may receive: Red blood cells. These carry oxygen to the cells in the body. Platelets. These help your blood to clot. Plasma. This is the liquid part of your blood. It carries proteins and other substances throughout the body. White blood cells. These help you fight infections. If you have hemophilia or another clotting disorder, you may also receive other types of blood products. Depending on the type of blood product, this procedure may take 1-4 hours to complete. Tell a health care provider about: Any bleeding problems you have. Any previous reactions you have had during a blood transfusion. Any allergies you have. All medicines you are taking, including vitamins, herbs, eye drops, creams, and over-the-counter medicines. Any surgeries you have had. Any medical conditions you have. Whether you are pregnant or may be pregnant. What are the risks? Talk with your health care provider about risks. The most common problems include: A mild allergic reaction, such as red, swollen areas of skin (hives) and itching. Fever or chills. This may be the body's response to new blood cells received. This may occur during or up to 4 hours after the transfusion. More serious problems may include: A serious allergic reaction that causes difficulty breathing or swelling around the face and lips. Transfusion-associated circulatory overload (TACO), or too much fluid in  the lungs. This may cause breathing problems. Transfusion-related acute lung injury (TRALI), which causes breathing difficulty and low oxygen in the blood. This can occur within hours of the transfusion or several days later. Iron overload. This can happen after receiving many blood transfusions over a period of time. Infection or virus being transmitted. This is rare because donated blood is carefully tested before it is given. Hemolytic transfusion reaction. This is rare. It happens when the body's defense system (immune system)tries to attack the new blood cells. Symptoms may include fever, chills, nausea, low blood pressure, and low back or chest pain. Transfusion-associated graft-versus-host disease (TAGVHD). This is rare. It happens when donated cells attack the body's healthy tissues. What happens before the procedure? You will have a blood test to check your blood type. This test is done to know what kind of blood your body will accept and to match it to the donor blood. If you are going to have a planned surgery, you may be able to do an autologous blood donation. This may be done in case you need to have a transfusion. You will have your temperature, blood pressure, and pulse checked before the transfusion. If you have had an allergic reaction to a transfusion in the past, you may be given medicine to help prevent a reaction. This medicine may be given to you by mouth (orally) or through an IV. What happens during the procedure?  An IV will be inserted into one of your veins. The bag of blood will be attached to your IV. The blood will then enter through your vein. Your temperature, blood pressure,   and pulse will be monitored during the transfusion. This monitoring is done to detect early signs of a transfusion reaction. Tell your nurse right away if you have any of these symptoms during the transfusion: Shortness of breath or trouble breathing. Chest or back pain. Fever or  chills. Itching or hives. If you have any signs or symptoms of a reaction, your transfusion will be stopped and you may be given medicine. When the transfusion is complete, your IV will be removed. Pressure may be applied to the IV site for a few minutes. A bandage (dressing)will be applied. The procedure may vary among health care providers and hospitals. What happens after the procedure? Your temperature, blood pressure, pulse, breathing rate, and blood oxygen level will be monitored until you leave the hospital or clinic. Your blood may be tested to see how you have responded to the transfusion. You may be warmed with fluids or blankets to maintain a normal body temperature. If you receive your blood transfusion in an outpatient setting, you will be told whom to contact to report any reactions. Where to find more information Visit the [employer]: redcross.org Summary A blood transfusion is a procedure in which you receive blood or a type of blood cell (blood component) through an IV. The blood given in a transfusion may be made up of different blood components. You may receive red blood cells, platelets, plasma, or white blood cells depending on the condition treated. Your temperature, blood pressure, and pulse will be monitored before, during, and after the transfusion. After the transfusion, your blood may be tested to see how your body has responded. This information is not intended to replace advice given to you by your health care provider. Make sure you discuss any questions you have with your health care provider. Document Revised: 03/22/2021 Document Reviewed: 03/22/2021 Elsevier Patient Education  2024 Else[employer] Inc.  

## 2022-08-25 ENCOUNTER — Ambulatory Visit (INDEPENDENT_AMBULATORY_CARE_PROVIDER_SITE_OTHER): Payer: Medicare Other

## 2022-08-25 DIAGNOSIS — C9110 Chronic lymphocytic leukemia of B-cell type not having achieved remission: Secondary | ICD-10-CM

## 2022-08-25 DIAGNOSIS — K5730 Diverticulosis of large intestine without perforation or abscess without bleeding: Secondary | ICD-10-CM | POA: Diagnosis not present

## 2022-08-25 DIAGNOSIS — J439 Emphysema, unspecified: Secondary | ICD-10-CM | POA: Diagnosis not present

## 2022-08-25 DIAGNOSIS — I7122 Aneurysm of the aortic arch, without rupture: Secondary | ICD-10-CM | POA: Diagnosis not present

## 2022-08-25 MED ORDER — IOHEXOL 300 MG/ML  SOLN
100.0000 mL | Freq: Once | INTRAMUSCULAR | Status: AC | PRN
  Administered 2022-08-25: 100 mL via INTRAVENOUS

## 2022-08-27 ENCOUNTER — Inpatient Hospital Stay: Payer: Medicare Other

## 2022-08-27 ENCOUNTER — Ambulatory Visit: Payer: Medicare Other | Admitting: Hematology & Oncology

## 2022-08-28 ENCOUNTER — Encounter: Payer: Self-pay | Admitting: *Deleted

## 2022-09-01 ENCOUNTER — Other Ambulatory Visit: Payer: Self-pay | Admitting: *Deleted

## 2022-09-01 ENCOUNTER — Encounter: Payer: Self-pay | Admitting: Hematology & Oncology

## 2022-09-01 ENCOUNTER — Inpatient Hospital Stay: Payer: Medicare Other

## 2022-09-01 DIAGNOSIS — D591 Autoimmune hemolytic anemia, unspecified: Secondary | ICD-10-CM | POA: Diagnosis not present

## 2022-09-01 DIAGNOSIS — R590 Localized enlarged lymph nodes: Secondary | ICD-10-CM | POA: Diagnosis not present

## 2022-09-01 DIAGNOSIS — Z7952 Long term (current) use of systemic steroids: Secondary | ICD-10-CM | POA: Diagnosis not present

## 2022-09-01 DIAGNOSIS — R161 Splenomegaly, not elsewhere classified: Secondary | ICD-10-CM | POA: Diagnosis not present

## 2022-09-01 DIAGNOSIS — C9110 Chronic lymphocytic leukemia of B-cell type not having achieved remission: Secondary | ICD-10-CM

## 2022-09-01 DIAGNOSIS — Z7982 Long term (current) use of aspirin: Secondary | ICD-10-CM | POA: Diagnosis not present

## 2022-09-01 LAB — LACTATE DEHYDROGENASE: LDH: 244 U/L — ABNORMAL HIGH (ref 98–192)

## 2022-09-01 LAB — CBC WITH DIFFERENTIAL (CANCER CENTER ONLY)
Abs Immature Granulocytes: 0.17 10*3/uL — ABNORMAL HIGH (ref 0.00–0.07)
Basophils Absolute: 0 10*3/uL (ref 0.0–0.1)
Basophils Relative: 0 %
Eosinophils Absolute: 0 10*3/uL (ref 0.0–0.5)
Eosinophils Relative: 0 %
HCT: 33.7 % — ABNORMAL LOW (ref 39.0–52.0)
Hemoglobin: 10.9 g/dL — ABNORMAL LOW (ref 13.0–17.0)
Immature Granulocytes: 1 %
Lymphocytes Relative: 62 %
Lymphs Abs: 10.8 10*3/uL — ABNORMAL HIGH (ref 0.7–4.0)
MCH: 35.2 pg — ABNORMAL HIGH (ref 26.0–34.0)
MCHC: 32.3 g/dL (ref 30.0–36.0)
MCV: 108.7 fL — ABNORMAL HIGH (ref 80.0–100.0)
Monocytes Absolute: 0.3 10*3/uL (ref 0.1–1.0)
Monocytes Relative: 2 %
Neutro Abs: 6.1 10*3/uL (ref 1.7–7.7)
Neutrophils Relative %: 35 %
Platelet Count: 128 10*3/uL — ABNORMAL LOW (ref 150–400)
RBC: 3.1 MIL/uL — ABNORMAL LOW (ref 4.22–5.81)
RDW: 15.9 % — ABNORMAL HIGH (ref 11.5–15.5)
Smear Review: NORMAL
WBC Count: 17.4 10*3/uL — ABNORMAL HIGH (ref 4.0–10.5)
nRBC: 0 % (ref 0.0–0.2)

## 2022-09-01 LAB — SAMPLE TO BLOOD BANK

## 2022-09-01 LAB — RETIC PANEL
Immature Retic Fract: 17.1 % — ABNORMAL HIGH (ref 2.3–15.9)
RBC.: 3.16 MIL/uL — ABNORMAL LOW (ref 4.22–5.81)
Retic Count, Absolute: 194.3 10*3/uL — ABNORMAL HIGH (ref 19.0–186.0)
Retic Ct Pct: 6.2 % — ABNORMAL HIGH (ref 0.4–3.1)
Reticulocyte Hemoglobin: 34.1 pg (ref >27.9)

## 2022-09-01 IMAGING — DX DG HAND COMPLETE 3+V*R*
3 series · 3 of 3 positions shown · non-contrast
Comparison: None.

CLINICAL DATA: Widespread hand aches and pains. Evaluate for
arthritic changes.

EXAM:
RIGHT HAND - COMPLETE 3+ VIEW

[hand pa]
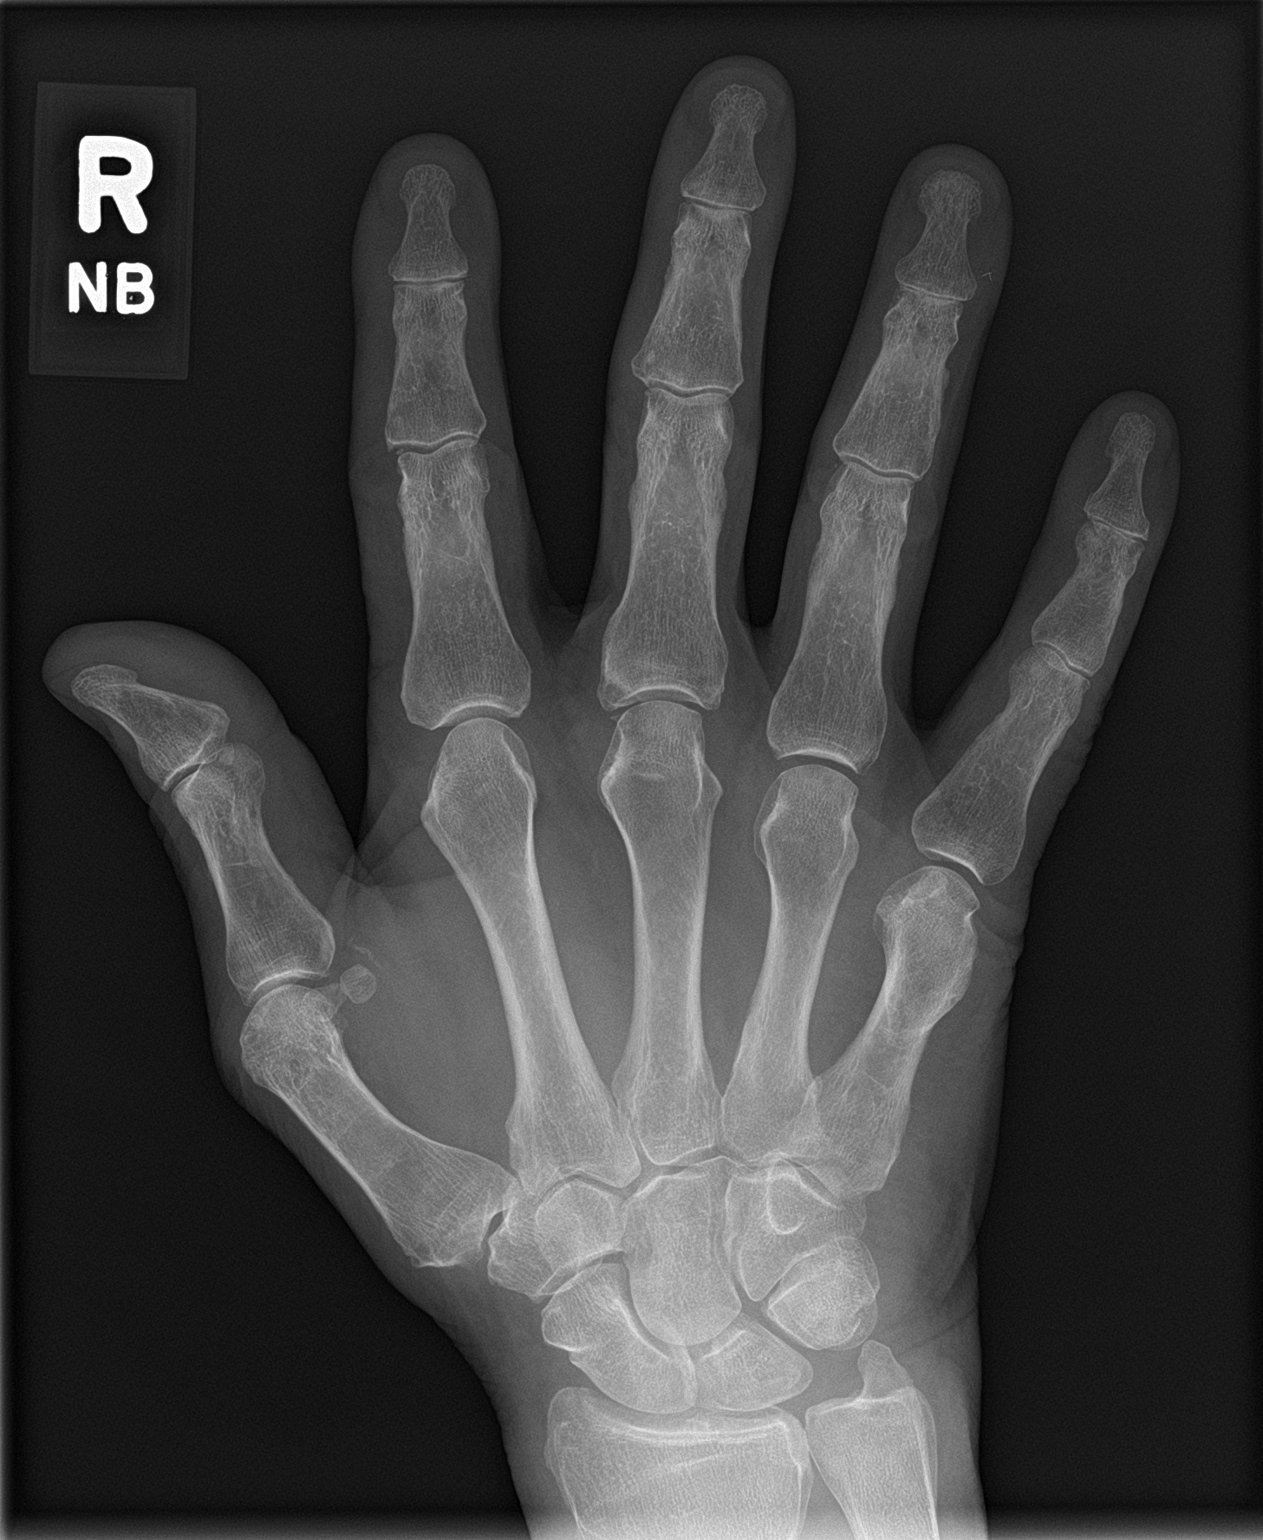

[hand obl]
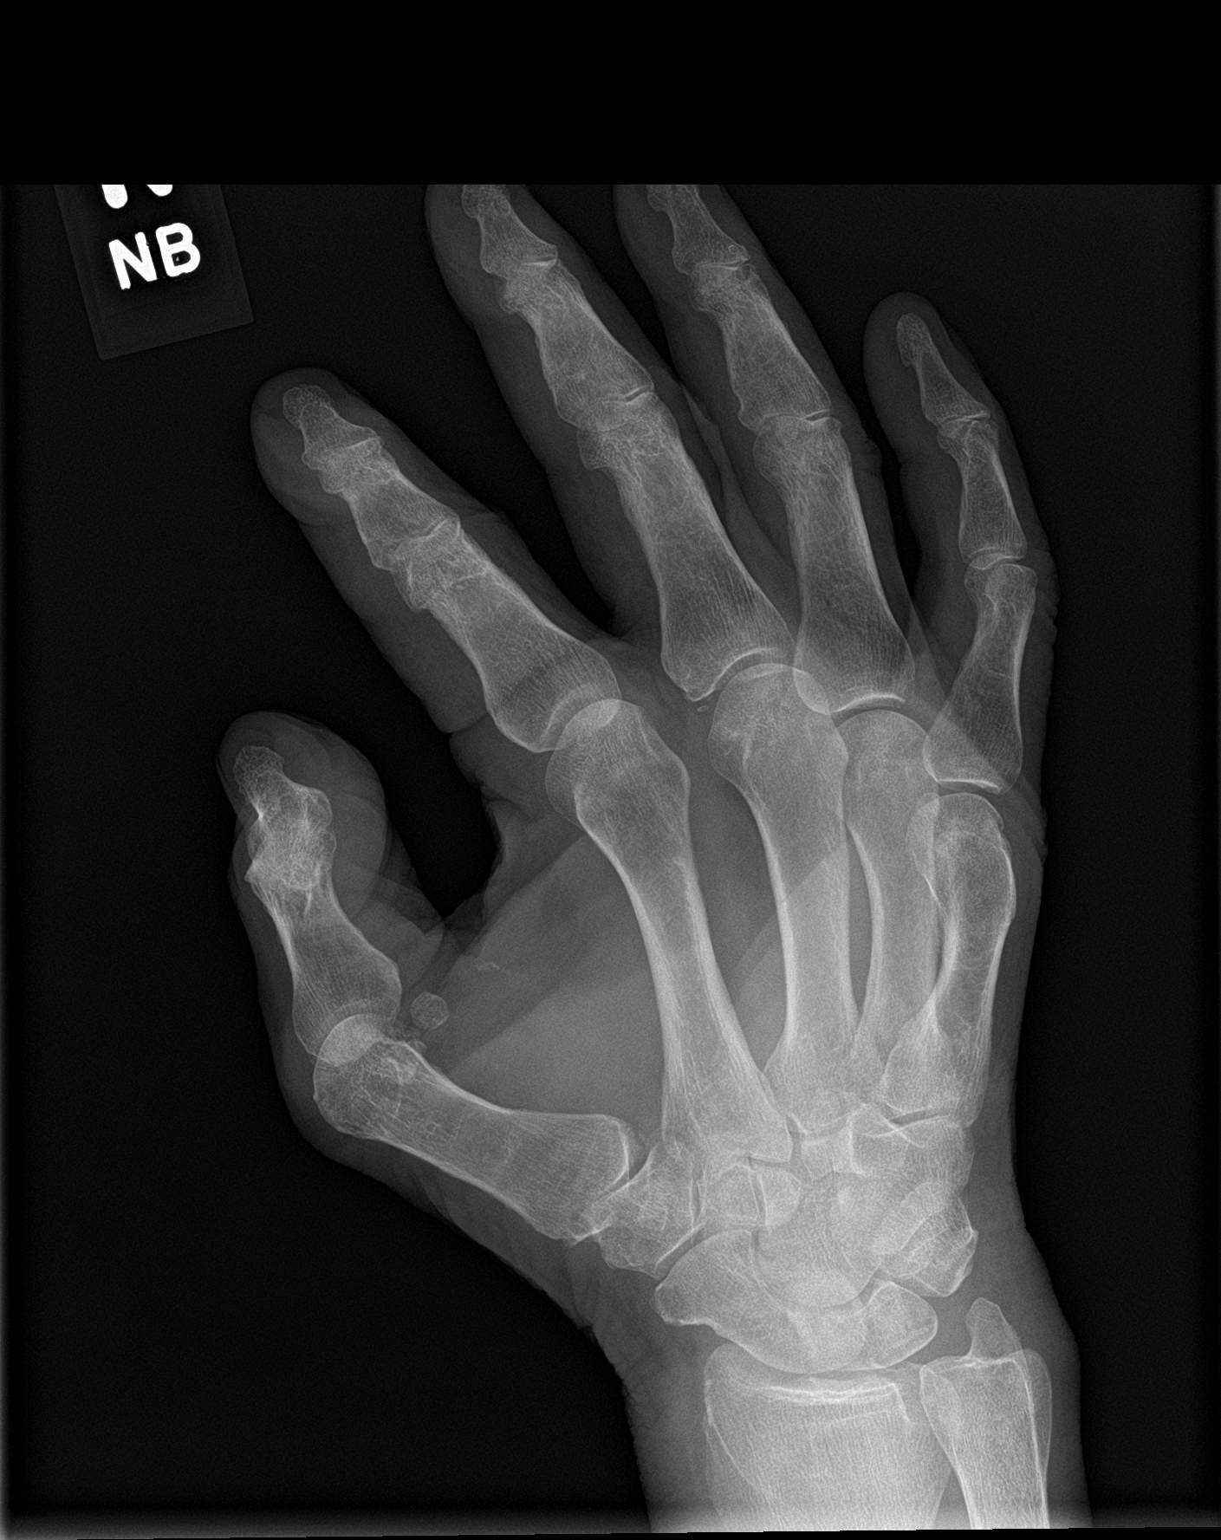

[hand lat]
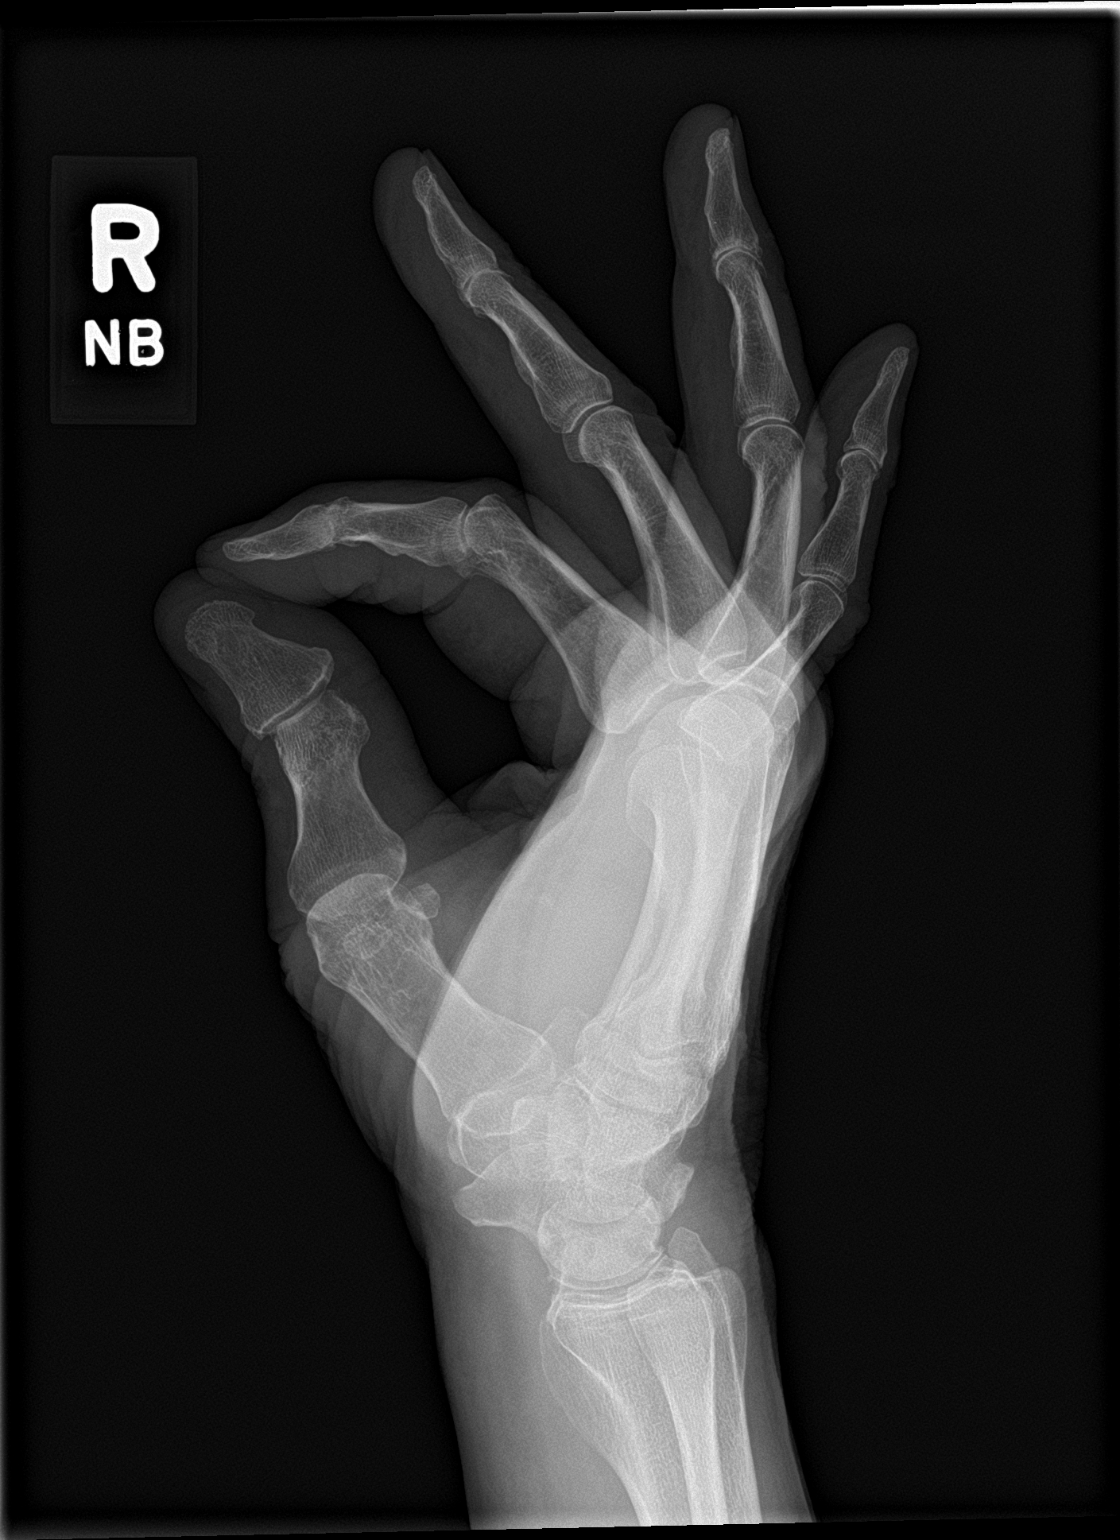

[3 of 3 positions shown; findings below may reference images not displayed]

FINDINGS: There is mild shortening of fifth metacarpal appearing to be
secondary to remote healed fracture. Mild-to-moderate DIP joint and
mild PIP joint space narrowing diffusely. Mild thumb
metacarpophalangeal and interphalangeal joint space narrowing.
Mild-to-moderate thumb carpometacarpal joint space narrowing and
subchondral sclerosis degenerative change. No acute fracture or
dislocation.
IMPRESSION: :
IMPRESSION: 1. Likely remote healed fracture of fifth metacarpal.
2. Osteoarthritis of the thumb carpometacarpal joint, thumb
metacarpophalangeal joint, and interphalangeal joints as above.

## 2022-09-02 ENCOUNTER — Telehealth: Payer: Self-pay | Admitting: *Deleted

## 2022-09-02 DIAGNOSIS — C9110 Chronic lymphocytic leukemia of B-cell type not having achieved remission: Secondary | ICD-10-CM

## 2022-09-03 ENCOUNTER — Other Ambulatory Visit: Payer: Self-pay | Admitting: *Deleted

## 2022-09-03 ENCOUNTER — Inpatient Hospital Stay: Payer: Medicare Other

## 2022-09-03 DIAGNOSIS — C9110 Chronic lymphocytic leukemia of B-cell type not having achieved remission: Secondary | ICD-10-CM

## 2022-09-03 DIAGNOSIS — R161 Splenomegaly, not elsewhere classified: Secondary | ICD-10-CM | POA: Diagnosis not present

## 2022-09-03 DIAGNOSIS — Z7982 Long term (current) use of aspirin: Secondary | ICD-10-CM | POA: Diagnosis not present

## 2022-09-03 DIAGNOSIS — D591 Autoimmune hemolytic anemia, unspecified: Secondary | ICD-10-CM | POA: Diagnosis not present

## 2022-09-03 DIAGNOSIS — Z7952 Long term (current) use of systemic steroids: Secondary | ICD-10-CM | POA: Diagnosis not present

## 2022-09-03 DIAGNOSIS — R590 Localized enlarged lymph nodes: Secondary | ICD-10-CM | POA: Diagnosis not present

## 2022-09-03 DIAGNOSIS — D509 Iron deficiency anemia, unspecified: Secondary | ICD-10-CM

## 2022-09-03 LAB — CBC WITH DIFFERENTIAL (CANCER CENTER ONLY)
Abs Immature Granulocytes: 0.15 10*3/uL — ABNORMAL HIGH (ref 0.00–0.07)
Basophils Absolute: 0.1 10*3/uL (ref 0.0–0.1)
Basophils Relative: 0 %
Eosinophils Absolute: 0.1 10*3/uL (ref 0.0–0.5)
Eosinophils Relative: 0 %
HCT: 35 % — ABNORMAL LOW (ref 39.0–52.0)
Hemoglobin: 11.3 g/dL — ABNORMAL LOW (ref 13.0–17.0)
Immature Granulocytes: 1 %
Lymphocytes Relative: 69 %
Lymphs Abs: 21.6 10*3/uL — ABNORMAL HIGH (ref 0.7–4.0)
MCH: 35.1 pg — ABNORMAL HIGH (ref 26.0–34.0)
MCHC: 32.3 g/dL (ref 30.0–36.0)
MCV: 108.7 fL — ABNORMAL HIGH (ref 80.0–100.0)
Monocytes Absolute: 0.4 10*3/uL (ref 0.1–1.0)
Monocytes Relative: 1 %
Neutro Abs: 9.2 10*3/uL — ABNORMAL HIGH (ref 1.7–7.7)
Neutrophils Relative %: 29 %
Platelet Count: 123 10*3/uL — ABNORMAL LOW (ref 150–400)
RBC: 3.22 MIL/uL — ABNORMAL LOW (ref 4.22–5.81)
RDW: 15.7 % — ABNORMAL HIGH (ref 11.5–15.5)
Smear Review: NORMAL
WBC Count: 31.4 10*3/uL — ABNORMAL HIGH (ref 4.0–10.5)
nRBC: 0.1 % (ref 0.0–0.2)

## 2022-09-03 LAB — FERRITIN: Ferritin: 21 ng/mL — ABNORMAL LOW (ref 24–336)

## 2022-09-03 LAB — SAVE SMEAR(SSMR), FOR PROVIDER SLIDE REVIEW

## 2022-09-03 LAB — RETIC PANEL
Immature Retic Fract: 21.3 % — ABNORMAL HIGH (ref 2.3–15.9)
RBC.: 3.19 MIL/uL — ABNORMAL LOW (ref 4.22–5.81)
Retic Count, Absolute: 199.4 10*3/uL — ABNORMAL HIGH (ref 19.0–186.0)
Retic Ct Pct: 6.3 % — ABNORMAL HIGH (ref 0.4–3.1)
Reticulocyte Hemoglobin: 34.8 pg (ref >27.9)

## 2022-09-03 LAB — LACTATE DEHYDROGENASE: LDH: 228 U/L — ABNORMAL HIGH (ref 98–192)

## 2022-09-04 ENCOUNTER — Inpatient Hospital Stay: Payer: Medicare Other

## 2022-09-04 ENCOUNTER — Other Ambulatory Visit: Payer: Self-pay

## 2022-09-04 ENCOUNTER — Inpatient Hospital Stay (HOSPITAL_BASED_OUTPATIENT_CLINIC_OR_DEPARTMENT_OTHER): Payer: Medicare Other | Admitting: Hematology & Oncology

## 2022-09-04 ENCOUNTER — Encounter: Payer: Self-pay | Admitting: Hematology & Oncology

## 2022-09-04 VITALS — BP 120/70 | HR 88 | Temp 97.5°F | Resp 18 | Ht 70.0 in | Wt 186.8 lb

## 2022-09-04 DIAGNOSIS — C9110 Chronic lymphocytic leukemia of B-cell type not having achieved remission: Secondary | ICD-10-CM | POA: Diagnosis not present

## 2022-09-04 DIAGNOSIS — Z7982 Long term (current) use of aspirin: Secondary | ICD-10-CM | POA: Diagnosis not present

## 2022-09-04 DIAGNOSIS — R161 Splenomegaly, not elsewhere classified: Secondary | ICD-10-CM | POA: Diagnosis not present

## 2022-09-04 DIAGNOSIS — R590 Localized enlarged lymph nodes: Secondary | ICD-10-CM | POA: Diagnosis not present

## 2022-09-04 DIAGNOSIS — Z7952 Long term (current) use of systemic steroids: Secondary | ICD-10-CM | POA: Diagnosis not present

## 2022-09-04 DIAGNOSIS — D591 Autoimmune hemolytic anemia, unspecified: Secondary | ICD-10-CM | POA: Diagnosis not present

## 2022-09-04 LAB — CMP (CANCER CENTER ONLY)
ALT: 13 U/L (ref 0–44)
AST: 13 U/L — ABNORMAL LOW (ref 15–41)
Albumin: 4.4 g/dL (ref 3.5–5.0)
Alkaline Phosphatase: 60 U/L (ref 38–126)
Anion gap: 9 (ref 5–15)
BUN: 23 mg/dL (ref 8–23)
CO2: 28 mmol/L (ref 22–32)
Calcium: 9.4 mg/dL (ref 8.9–10.3)
Chloride: 101 mmol/L (ref 98–111)
Creatinine: 0.99 mg/dL (ref 0.61–1.24)
GFR, Estimated: >60 mL/min (ref >60)
Glucose, Bld: 175 mg/dL — ABNORMAL HIGH (ref 70–99)
Potassium: 4.7 mmol/L (ref 3.5–5.1)
Sodium: 138 mmol/L (ref 135–145)
Total Bilirubin: 1.2 mg/dL (ref 0.3–1.2)
Total Protein: 6.3 g/dL — ABNORMAL LOW (ref 6.5–8.1)

## 2022-09-04 LAB — IRON AND IRON BINDING CAPACITY (CC-WL,HP ONLY)
Iron: 84 ug/dL (ref 45–182)
Saturation Ratios: 24 % (ref 17.9–39.5)
TIBC: 350 ug/dL (ref 250–450)
UIBC: 266 ug/dL

## 2022-09-04 MED ORDER — FOLIC ACID 1 MG PO TABS: 2.0000 mg | ORAL_TABLET | Freq: Every day | ORAL | 12 refills | Status: DC

## 2022-09-04 NOTE — Progress Notes (Signed)
Hematology and Oncology Follow Up Visit  Brayan Kennard [id number] 1945-07-15 77 y.o. 09/04/2022   Principle Diagnosis:  Stage C CLL -autoimmune hemolytic anemia  Current Therapy:   Prednisone 80 mg p.o. daily-started on 08/20/2022 --decrease to 40 mg p.o. daily on 09/04/2022   Interim History:  Mr. Bley is here today for follow-up.  My last saw him, he had autoimmune hemolytic anemia secondary to the CLL.  We started him on steroids.  He has responded nicely.  He was transfused.  His hemoglobin now is 11.3.  The reticulocyte count is 6.3%.  He did have a CT scan done on 08/25/2022.  This patient shows stable disease with respect to the CLL.  He had a prominent right axillary, mediastinal and abdominal adenopathy.  He did have some splenomegaly measuring 18.5 cm.  I am trying to hold off on treating the actual CLL.  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  He has had much better activity level now.  He has much better stamina.  His appetite is quite good.  I am sure the steroids are helping.  He has had no nausea or vomiting.  There is no cough or shortness of breath.  He has had no rashes.  There is been no leg swelling.  Overall, I would say his performance status is probably ECOG 1.   Medications:  Allergies as of 09/04/2022       Reactions   Levaquin [levofloxacin] Other (See Comments)   Due to medical hx        Medication List        Accurate as of September 04, 2022  4:08 PM. If you have any questions, ask your nurse or doctor.          aspirin EC 81 MG tablet Take 1 tablet (81 mg total) by mouth daily at 6 (six) AM. Swallow whole.   AZO CRANBERRY PO Take 1 each by mouth in the morning.   cephALEXin 250 MG capsule Commonly known as: KEFLEX Take 1 capsule (250 mg total) by mouth daily.   famciclovir 250 MG tablet Commonly known as: FAMVIR Take 1 tablet (250 mg total) by mouth 2 (two) times daily.    finasteride 5 MG tablet Commonly known as: PROSCAR Take 1 tablet (5 mg total) by mouth daily.   fluconazole 100 MG tablet Commonly known as: DIFLUCAN Take 1 tablet (100 mg total) by mouth daily.   MULTIVITAMIN GUMMIES ADULTS PO Take 2 tablets by mouth in the morning.   predniSONE 20 MG tablet Commonly known as: DELTASONE Take 4 tablets (80 mg total) by mouth daily with breakfast.   tamsulosin 0.4 MG Caps capsule Commonly known as: FLOMAX Take 1 capsule (0.4 mg total) by mouth in the morning and at bedtime.   VITAMIN B 12 PO Take 1 tablet by mouth in the morning.        Allergies:  Allergies  Allergen Reactions   Levaquin [Levofloxacin] Other (See Comments)    Due to medical hx    Past Medical History, Surgical history, Social history, and Family History were reviewed and updated.  Review of Systems: Review of Systems  Constitutional: Negative.   HENT: Negative.    Eyes: Negative.   Respiratory: Negative.    Cardiovascular: Negative.   Gastrointestinal: Negative.   Genitourinary: Negative.   Musculoskeletal: Negative.   Skin: Negative.   Neurological: Negative.   Endo/Heme/Allergies: Negative.   Psychiatric/Behavioral: Negative.  Physical Exam:  height is 5\' 10"  (1.778 m) and weight is 186 lb 12.8 oz (84.7 kg). His oral temperature is 97.5 F (36.4 C) (abnormal). His blood pressure is 120/70 and his pulse is 88. His respiration is 18 and oxygen saturation is 96%.   Wt Readings from Last 3 Encounters:  09/04/22 186 lb 12.8 oz (84.7 kg)  08/20/22 185 lb 12.8 oz (84.3 kg)  08/19/22 186 lb 12.8 oz (84.7 kg)    Physical Exam Vitals reviewed.  HENT:     Head: Normocephalic and atraumatic.  Eyes:     Pupils: Pupils are equal, round, and reactive to light.  Cardiovascular:     Rate and Rhythm: Normal rate and regular rhythm.     Heart sounds: Normal heart sounds.  Pulmonary:     Effort: Pulmonary effort is normal.     Breath sounds: Normal breath  sounds.  Abdominal:     General: Bowel sounds are normal.     Palpations: Abdomen is soft.  Musculoskeletal:        General: No tenderness or deformity. Normal range of motion.     Cervical back: Normal range of motion.  Lymphadenopathy:     Cervical: No cervical adenopathy.  Skin:    General: Skin is warm and dry.     Findings: No erythema or rash.  Neurological:     Mental Status: He is alert and oriented to person, place, and time.  Psychiatric:        Behavior: Behavior normal.        Thought Content: Thought content normal.        Judgment: Judgment normal.      Lab Results  Component Value Date   WBC 31.4 (H) 09/03/2022   HGB 11.3 (L) 09/03/2022   HCT 35.0 (L) 09/03/2022   MCV 108.7 (H) 09/03/2022   PLT 123 (L) 09/03/2022   Lab Results  Component Value Date   FERRITIN 21 (L) 09/03/2022   IRON 84 09/03/2022   TIBC 350 09/03/2022   UIBC 266 09/03/2022   IRONPCTSAT 24 09/03/2022   Lab Results  Component Value Date   RETICCTPCT 6.3 (H) 09/03/2022   RBC 3.19 (L) 09/03/2022   Lab Results  Component Value Date   KPAFRELGTCHN 12.8 08/26/2021   LAMBDASER 24.9 08/26/2021   KAPLAMBRATIO 0.51 08/26/2021   Lab Results  Component Value Date   IGGSERUM 808 08/26/2021   IGA 149 08/26/2021   IGMSERUM 39 08/26/2021   No results found for: "TOTALPROTELP", "ALBUMINELP", "A1GS", "A2GS", "BETS", "BETA2SER", "GAMS", "MSPIKE", "SPEI"   Chemistry      Component Value Date/Time   NA 138 09/04/2022 1515   K 4.7 09/04/2022 1515   CL 101 09/04/2022 1515   CO2 28 09/04/2022 1515   BUN 23 09/04/2022 1515   CREATININE 0.99 09/04/2022 1515   CREATININE 1.03 08/15/2021 0000      Component Value Date/Time   CALCIUM 9.4 09/04/2022 1515   ALKPHOS 60 09/04/2022 1515   AST 13 (L) 09/04/2022 1515   ALT 13 09/04/2022 1515   BILITOT 1.2 09/04/2022 1515       Impression and Plan: Mr. Stokely is a very pleasant 77 yo caucasian gentleman with stage C CLL.  He now has a autoimmune  hemolytic anemia.  Hopefully, the prednisone is helping.  I am going to decrease his dose down to 40 mg a day now.  Also going to put him on folic acid.  He needs about 2 grams p.o.  daily.  I will check his CBC in 2 weeks.  I think this will be very helpful.  I will plan to see him back in 1 month.  Again, I would try to hold off on treating the actual CLL for right now.  He certainly knows that he can call if he has any problems between now and 2 weeks.     Josph Macho, MD 8/29/20244:08 PM

## 2022-09-09 ENCOUNTER — Encounter: Payer: Self-pay | Admitting: Hematology & Oncology

## 2022-09-11 ENCOUNTER — Other Ambulatory Visit: Payer: Self-pay | Admitting: Hematology & Oncology

## 2022-09-18 ENCOUNTER — Inpatient Hospital Stay (HOSPITAL_BASED_OUTPATIENT_CLINIC_OR_DEPARTMENT_OTHER): Payer: Medicare Other | Admitting: Medical Oncology

## 2022-09-18 ENCOUNTER — Encounter: Payer: Self-pay | Admitting: Medical Oncology

## 2022-09-18 ENCOUNTER — Inpatient Hospital Stay: Payer: Medicare Other | Attending: Hematology & Oncology

## 2022-09-18 ENCOUNTER — Other Ambulatory Visit: Payer: Self-pay

## 2022-09-18 VITALS — BP 126/56 | HR 80 | Temp 97.8°F | Resp 18 | Ht 70.0 in | Wt 187.8 lb

## 2022-09-18 DIAGNOSIS — C9110 Chronic lymphocytic leukemia of B-cell type not having achieved remission: Secondary | ICD-10-CM | POA: Diagnosis not present

## 2022-09-18 DIAGNOSIS — D591 Autoimmune hemolytic anemia, unspecified: Secondary | ICD-10-CM | POA: Insufficient documentation

## 2022-09-18 LAB — DIRECT ANTIGLOBULIN TEST (NOT AT ARMC)
DAT, IgG: POSITIVE
DAT, complement: POSITIVE

## 2022-09-18 LAB — CBC WITH DIFFERENTIAL (CANCER CENTER ONLY)
Abs Immature Granulocytes: 0.05 10*3/uL (ref 0.00–0.07)
Basophils Absolute: 0 10*3/uL (ref 0.0–0.1)
Basophils Relative: 0 %
Eosinophils Absolute: 0 10*3/uL (ref 0.0–0.5)
Eosinophils Relative: 0 %
HCT: 36.6 % — ABNORMAL LOW (ref 39.0–52.0)
Hemoglobin: 11.7 g/dL — ABNORMAL LOW (ref 13.0–17.0)
Immature Granulocytes: 0 %
Lymphocytes Relative: 68 %
Lymphs Abs: 9.3 10*3/uL — ABNORMAL HIGH (ref 0.7–4.0)
MCH: 35.6 pg — ABNORMAL HIGH (ref 26.0–34.0)
MCHC: 32 g/dL (ref 30.0–36.0)
MCV: 111.2 fL — ABNORMAL HIGH (ref 80.0–100.0)
Monocytes Absolute: 0.7 10*3/uL (ref 0.1–1.0)
Monocytes Relative: 5 %
Neutro Abs: 3.6 10*3/uL (ref 1.7–7.7)
Neutrophils Relative %: 27 %
Platelet Count: 85 10*3/uL — ABNORMAL LOW (ref 150–400)
RBC: 3.29 MIL/uL — ABNORMAL LOW (ref 4.22–5.81)
RDW: 14.7 % (ref 11.5–15.5)
Smear Review: NORMAL
WBC Count: 13.7 10*3/uL — ABNORMAL HIGH (ref 4.0–10.5)
nRBC: 0 % (ref 0.0–0.2)

## 2022-09-18 LAB — LACTATE DEHYDROGENASE: LDH: 234 U/L — ABNORMAL HIGH (ref 98–192)

## 2022-09-18 LAB — RETICULOCYTES
Immature Retic Fract: 18.8 % — ABNORMAL HIGH (ref 2.3–15.9)
RBC.: 3.2 MIL/uL — ABNORMAL LOW (ref 4.22–5.81)
Retic Count, Absolute: 185.6 10*3/uL (ref 19.0–186.0)
Retic Ct Pct: 5.8 % — ABNORMAL HIGH (ref 0.4–3.1)

## 2022-09-18 MED ORDER — PREDNISONE 10 MG PO TABS: 10.0000 mg | ORAL_TABLET | ORAL | 0 refills | Status: DC

## 2022-09-18 NOTE — Progress Notes (Signed)
Hematology and Oncology Follow Up Visit  Ethann Bonaccorso [id number] December 08, 1945 77 y.o. 09/18/2022   Principle Diagnosis:  Stage C CLL -autoimmune hemolytic anemia  Current Therapy:   Prednisone 80 mg p.o. daily-started on 08/20/2022 --decrease to 40 mg p.o. daily on 09/04/2022 Folic Acid 2 g Daily    Interim History:  Mr. Hicken is here today for follow-up.  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    He has been on high dose steroids (80 mg once daily). He weaned down from 80 mg to 40 mg once daily about 2 weeks ago. Unfortunately the high dose steroids caused him some hallucinations. Much better on the lower dosage.   His hemoglobin now is 11.7.  The reticulocyte count is 5.8%.  He did have a CT scan done on 08/25/2022.  This patient shows stable disease with respect to the CLL.  He had a prominent right axillary, mediastinal and abdominal adenopathy.  He did have some splenomegaly measuring 18.5 cm.  At the moment we are trying to hold off on treating the CLL for quality of life.   He has had no nausea or vomiting.  There is no cough or shortness of breath.  He has had no rashes.  There is been no leg swelling.  Overall, I would say his performance status is probably ECOG 1.  Wt Readings from Last 3 Encounters:  09/18/22 187 lb 12.8 oz (85.2 kg)  09/04/22 186 lb 12.8 oz (84.7 kg)  08/20/22 185 lb 12.8 oz (84.3 kg)     Medications:  Allergies as of 09/18/2022       Reactions   Levaquin [levofloxacin] Other (See Comments)   Due to medical hx        Medication List        Accurate as of September 18, 2022  1:34 PM. If you have any questions, ask your nurse or doctor.          aspirin EC 81 MG tablet Take 1 tablet (81 mg total) by mouth daily at 6 (six) AM. Swallow whole.   AZO CRANBERRY PO Take 1 each by mouth in the morning.   cephALEXin 250 MG capsule Commonly known as: KEFLEX Take 1 capsule (250 mg total) by mouth daily.    famciclovir 250 MG tablet Commonly known as: FAMVIR TAKE 1 TABLET BY MOUTH TWICE A DAY   finasteride 5 MG tablet Commonly known as: PROSCAR Take 1 tablet (5 mg total) by mouth daily.   fluconazole 100 MG tablet Commonly known as: DIFLUCAN Take 1 tablet (100 mg total) by mouth daily.   folic acid 1 MG tablet Commonly known as: FOLVITE Take 2 tablets (2 mg total) by mouth daily.   MULTIVITAMIN GUMMIES ADULTS PO Take 2 tablets by mouth in the morning.   predniSONE 20 MG tablet Commonly known as: DELTASONE Take 4 tablets (80 mg total) by mouth daily with breakfast.   tamsulosin 0.4 MG Caps capsule Commonly known as: FLOMAX Take 1 capsule (0.4 mg total) by mouth in the morning and at bedtime.   VITAMIN B 12 PO Take 1 tablet by mouth in the morning.        Allergies:  Allergies  Allergen Reactions   Levaquin [Levofloxacin] Other (See Comments)    Due to medical hx    Past Medical History, Surgical history, Social history, and Family History were reviewed and updated.  Review of Systems: Review of Systems  Constitutional: Negative.   HENT:  Negative.    Eyes: Negative.   Respiratory: Negative.    Cardiovascular: Negative.   Gastrointestinal: Negative.   Genitourinary: Negative.   Musculoskeletal: Negative.   Skin: Negative.   Neurological: Negative.   Endo/Heme/Allergies: Negative.   Psychiatric/Behavioral: Negative.     Physical Exam:  height is 5\' 10"  (1.778 m) and weight is 187 lb 12.8 oz (85.2 kg). His oral temperature is 97.8 F (36.6 C). His blood pressure is 126/56 (abnormal) and his pulse is 80. His respiration is 18 and oxygen saturation is 100%.   Wt Readings from Last 3 Encounters:  09/18/22 187 lb 12.8 oz (85.2 kg)  09/04/22 186 lb 12.8 oz (84.7 kg)  08/20/22 185 lb 12.8 oz (84.3 kg)    Physical Exam Vitals reviewed.  HENT:     Head: Normocephalic and atraumatic.  Eyes:     Pupils: Pupils are equal, round, and reactive to light.   Cardiovascular:     Rate and Rhythm: Normal rate and regular rhythm.     Heart sounds: Normal heart sounds.  Pulmonary:     Effort: Pulmonary effort is normal.     Breath sounds: Normal breath sounds.  Abdominal:     General: Bowel sounds are normal.     Palpations: Abdomen is soft.  Musculoskeletal:        General: No tenderness or deformity. Normal range of motion.     Cervical back: Normal range of motion.  Lymphadenopathy:     Cervical: No cervical adenopathy.  Skin:    General: Skin is warm and dry.     Findings: No erythema or rash.  Neurological:     Mental Status: He is alert and oriented to person, place, and time.  Psychiatric:        Behavior: Behavior normal.        Thought Content: Thought content normal.        Judgment: Judgment normal.      Lab Results  Component Value Date   WBC 13.7 (H) 09/18/2022   HGB 11.7 (L) 09/18/2022   HCT 36.6 (L) 09/18/2022   MCV 111.2 (H) 09/18/2022   PLT 85 (L) 09/18/2022   Lab Results  Component Value Date   FERRITIN 21 (L) 09/03/2022   IRON 84 09/03/2022   TIBC 350 09/03/2022   UIBC 266 09/03/2022   IRONPCTSAT 24 09/03/2022   Lab Results  Component Value Date   RETICCTPCT 5.8 (H) 09/18/2022   RBC 3.20 (L) 09/18/2022   Lab Results  Component Value Date   KPAFRELGTCHN 12.8 08/26/2021   LAMBDASER 24.9 08/26/2021   KAPLAMBRATIO 0.51 08/26/2021   Lab Results  Component Value Date   IGGSERUM 808 08/26/2021   IGA 149 08/26/2021   IGMSERUM 39 08/26/2021   No results found for: "TOTALPROTELP", "ALBUMINELP", "A1GS", "A2GS", "BETS", "BETA2SER", "GAMS", "MSPIKE", "SPEI"   Chemistry      Component Value Date/Time   NA 138 09/04/2022 1515   K 4.7 09/04/2022 1515   CL 101 09/04/2022 1515   CO2 28 09/04/2022 1515   BUN 23 09/04/2022 1515   CREATININE 0.99 09/04/2022 1515   CREATININE 1.03 08/15/2021 0000      Component Value Date/Time   CALCIUM 9.4 09/04/2022 1515   ALKPHOS 60 09/04/2022 1515   AST 13 (L)  09/04/2022 1515   ALT 13 09/04/2022 1515   BILITOT 1.2 09/04/2022 1515       Impression and Plan: Mr. Bivens is a very pleasant 77 yo caucasian gentleman with stage  C CLL. He now has a autoimmune hemolytic anemia.   Review of his labs show generalized improvement. Our plan at this time is to have him alternate 20 mg and 10 mg prednisone until we see him in 1 month. Reviewed with Dr. Myna Hidalgo.  Labs weekly  Reviewed red flags  Disposition Labs weekly (CBC w/ diff) x 3 weeks RTC 1 month MD, labs ( CBC w/, LDH, reticulocytes, direct antiglobulin test)     Rushie Chestnut, PA-C 9/12/20241:34 PM

## 2022-09-25 ENCOUNTER — Inpatient Hospital Stay: Payer: Medicare Other

## 2022-09-25 DIAGNOSIS — D591 Autoimmune hemolytic anemia, unspecified: Secondary | ICD-10-CM

## 2022-09-25 DIAGNOSIS — C9110 Chronic lymphocytic leukemia of B-cell type not having achieved remission: Secondary | ICD-10-CM | POA: Diagnosis not present

## 2022-09-25 LAB — CBC WITH DIFFERENTIAL (CANCER CENTER ONLY)
Abs Immature Granulocytes: 0.05 10*3/uL (ref 0.00–0.07)
Basophils Absolute: 0 10*3/uL (ref 0.0–0.1)
Basophils Relative: 0 %
Eosinophils Absolute: 0 10*3/uL (ref 0.0–0.5)
Eosinophils Relative: 0 %
HCT: 35.2 % — ABNORMAL LOW (ref 39.0–52.0)
Hemoglobin: 11.3 g/dL — ABNORMAL LOW (ref 13.0–17.0)
Immature Granulocytes: 0 %
Lymphocytes Relative: 72 %
Lymphs Abs: 9.8 10*3/uL — ABNORMAL HIGH (ref 0.7–4.0)
MCH: 36 pg — ABNORMAL HIGH (ref 26.0–34.0)
MCHC: 32.1 g/dL (ref 30.0–36.0)
MCV: 112.1 fL — ABNORMAL HIGH (ref 80.0–100.0)
Monocytes Absolute: 1 10*3/uL (ref 0.1–1.0)
Monocytes Relative: 7 %
Neutro Abs: 3 10*3/uL (ref 1.7–7.7)
Neutrophils Relative %: 21 %
Platelet Count: 109 10*3/uL — ABNORMAL LOW (ref 150–400)
RBC: 3.14 MIL/uL — ABNORMAL LOW (ref 4.22–5.81)
RDW: 13.9 % (ref 11.5–15.5)
Smear Review: NORMAL
WBC Count: 13.9 10*3/uL — ABNORMAL HIGH (ref 4.0–10.5)
nRBC: 0 % (ref 0.0–0.2)

## 2022-10-02 ENCOUNTER — Encounter: Payer: Self-pay | Admitting: Hematology & Oncology

## 2022-10-02 ENCOUNTER — Inpatient Hospital Stay: Payer: Medicare Other

## 2022-10-02 ENCOUNTER — Inpatient Hospital Stay (HOSPITAL_BASED_OUTPATIENT_CLINIC_OR_DEPARTMENT_OTHER): Payer: Medicare Other | Admitting: Hematology & Oncology

## 2022-10-02 ENCOUNTER — Other Ambulatory Visit: Payer: Self-pay

## 2022-10-02 VITALS — BP 131/65 | HR 79 | Temp 98.0°F | Resp 18 | Ht 70.0 in | Wt 184.0 lb

## 2022-10-02 DIAGNOSIS — D591 Autoimmune hemolytic anemia, unspecified: Secondary | ICD-10-CM | POA: Diagnosis not present

## 2022-10-02 DIAGNOSIS — C9110 Chronic lymphocytic leukemia of B-cell type not having achieved remission: Secondary | ICD-10-CM | POA: Diagnosis not present

## 2022-10-02 LAB — CMP (CANCER CENTER ONLY)
ALT: 12 U/L (ref 0–44)
AST: 15 U/L (ref 15–41)
Albumin: 4.3 g/dL (ref 3.5–5.0)
Alkaline Phosphatase: 63 U/L (ref 38–126)
Anion gap: 6 (ref 5–15)
BUN: 19 mg/dL (ref 8–23)
CO2: 31 mmol/L (ref 22–32)
Calcium: 9 mg/dL (ref 8.9–10.3)
Chloride: 104 mmol/L (ref 98–111)
Creatinine: 1.07 mg/dL (ref 0.61–1.24)
GFR, Estimated: >60 mL/min (ref >60)
Glucose, Bld: 148 mg/dL — ABNORMAL HIGH (ref 70–99)
Potassium: 4.4 mmol/L (ref 3.5–5.1)
Sodium: 141 mmol/L (ref 135–145)
Total Bilirubin: 0.9 mg/dL (ref 0.3–1.2)
Total Protein: 6 g/dL — ABNORMAL LOW (ref 6.5–8.1)

## 2022-10-02 LAB — CBC WITH DIFFERENTIAL (CANCER CENTER ONLY)
Abs Immature Granulocytes: 0 10*3/uL (ref 0.00–0.07)
Band Neutrophils: 1 %
Basophils Absolute: 0 10*3/uL (ref 0.0–0.1)
Basophils Relative: 0 %
Eosinophils Absolute: 0 10*3/uL (ref 0.0–0.5)
Eosinophils Relative: 0 %
HCT: 37.3 % — ABNORMAL LOW (ref 39.0–52.0)
Hemoglobin: 12.1 g/dL — ABNORMAL LOW (ref 13.0–17.0)
Lymphocytes Relative: 70 %
Lymphs Abs: 9 10*3/uL — ABNORMAL HIGH (ref 0.7–4.0)
MCH: 36 pg — ABNORMAL HIGH (ref 26.0–34.0)
MCHC: 32.4 g/dL (ref 30.0–36.0)
MCV: 111 fL — ABNORMAL HIGH (ref 80.0–100.0)
Monocytes Absolute: 0.3 10*3/uL (ref 0.1–1.0)
Monocytes Relative: 2 %
Neutro Abs: 3.6 10*3/uL (ref 1.7–7.7)
Neutrophils Relative %: 27 %
Platelet Count: 94 10*3/uL — ABNORMAL LOW (ref 150–400)
RBC: 3.36 MIL/uL — ABNORMAL LOW (ref 4.22–5.81)
RDW: 13.3 % (ref 11.5–15.5)
Smear Review: DECREASED
WBC Count: 12.9 10*3/uL — ABNORMAL HIGH (ref 4.0–10.5)
nRBC: 0 % (ref 0.0–0.2)

## 2022-10-02 LAB — RETICULOCYTES
Immature Retic Fract: 22.9 % — ABNORMAL HIGH (ref 2.3–15.9)
RBC.: 3.42 MIL/uL — ABNORMAL LOW (ref 4.22–5.81)
Retic Count, Absolute: 184.7 10*3/uL (ref 19.0–186.0)
Retic Ct Pct: 5.4 % — ABNORMAL HIGH (ref 0.4–3.1)

## 2022-10-02 LAB — DIRECT ANTIGLOBULIN TEST (NOT AT ARMC)
DAT, IgG: POSITIVE
DAT, complement: POSITIVE

## 2022-10-02 LAB — SAVE SMEAR(SSMR), FOR PROVIDER SLIDE REVIEW

## 2022-10-02 LAB — LACTATE DEHYDROGENASE: LDH: 277 U/L — ABNORMAL HIGH (ref 98–192)

## 2022-10-02 NOTE — Progress Notes (Signed)
Hematology and Oncology Follow Up Visit  Jason Abbott [id number] 26-Dec-1945 77 y.o. 10/02/2022   Principle Diagnosis:  Stage C CLL -autoimmune hemolytic anemia  Current Therapy:   Prednisone 80 mg p.o. daily-started on 08/20/2022 --decrease to 40 mg p.o. daily on 09/04/2022 Prednisone 10 mg p.o. daily-changes on 10/02/2022   Interim History:  Jason Abbott is here today for follow-up.  He continues to do quite well.  He has responded well to the prednisone.  On a prednisone taper, he is doing well.  His reticulocyte count is coming down.  His hemoglobin is coming up.  He has had no problems with nausea or vomiting.  He has had no problems with the prednisone.  Does have some easy bruising.  He has had no change in bowel or bladder habits.  He has had no cough or shortness of breath.  He has had no leg swelling.  Overall, I would say his performance status is ECOG 1.    Medications:  Allergies as of 10/02/2022       Reactions   Levaquin [levofloxacin] Other (See Comments)   Due to medical hx        Medication List        Accurate as of October 02, 2022  2:14 PM. If you have any questions, ask your nurse or doctor.          aspirin EC 81 MG tablet Take 1 tablet (81 mg total) by mouth daily at 6 (six) AM. Swallow whole.   AZO CRANBERRY PO Take 1 each by mouth in the morning.   cephALEXin 250 MG capsule Commonly known as: KEFLEX Take 1 capsule (250 mg total) by mouth daily.   famciclovir 250 MG tablet Commonly known as: FAMVIR TAKE 1 TABLET BY MOUTH TWICE A DAY   finasteride 5 MG tablet Commonly known as: PROSCAR Take 1 tablet (5 mg total) by mouth daily.   fluconazole 100 MG tablet Commonly known as: DIFLUCAN Take 1 tablet (100 mg total) by mouth daily.   folic acid 1 MG tablet Commonly known as: FOLVITE Take 2 tablets (2 mg total) by mouth daily.   MULTIVITAMIN GUMMIES ADULTS PO Take 2 tablets by mouth in the morning.   predniSONE 20 MG  tablet Commonly known as: DELTASONE Take 4 tablets (80 mg total) by mouth daily with breakfast. What changed:  how much to take when to take this   predniSONE 10 MG tablet Commonly known as: DELTASONE Take 1 tablet (10 mg total) by mouth every other day. What changed: Another medication with the same name was changed. Make sure you understand how and when to take each.   tamsulosin 0.4 MG Caps capsule Commonly known as: FLOMAX Take 1 capsule (0.4 mg total) by mouth in the morning and at bedtime.   VITAMIN B 12 PO Take 1 tablet by mouth in the morning.        Allergies:  Allergies  Allergen Reactions   Levaquin [Levofloxacin] Other (See Comments)    Due to medical hx    Past Medical History, Surgical history, Social history, and Family History were reviewed and updated.  Review of Systems: Review of Systems  Constitutional: Negative.   HENT: Negative.    Eyes: Negative.   Respiratory: Negative.    Cardiovascular: Negative.   Gastrointestinal: Negative.   Genitourinary: Negative.   Musculoskeletal: Negative.   Skin: Negative.   Neurological: Negative.   Endo/Heme/Allergies: Negative.   Psychiatric/Behavioral: Negative.  Physical Exam:  height is 5\' 10"  (1.778 m) and weight is 184 lb (83.5 kg). His oral temperature is 98 F (36.7 C). His blood pressure is 131/65 and his pulse is 79. His respiration is 18 and oxygen saturation is 97%.   Wt Readings from Last 3 Encounters:  10/02/22 184 lb (83.5 kg)  09/18/22 187 lb 12.8 oz (85.2 kg)  09/04/22 186 lb 12.8 oz (84.7 kg)    Physical Exam Vitals reviewed.  HENT:     Head: Normocephalic and atraumatic.  Eyes:     Pupils: Pupils are equal, round, and reactive to light.  Cardiovascular:     Rate and Rhythm: Normal rate and regular rhythm.     Heart sounds: Normal heart sounds.  Pulmonary:     Effort: Pulmonary effort is normal.     Breath sounds: Normal breath sounds.  Abdominal:     General: Bowel  sounds are normal.     Palpations: Abdomen is soft.  Musculoskeletal:        General: No tenderness or deformity. Normal range of motion.     Cervical back: Normal range of motion.  Lymphadenopathy:     Cervical: No cervical adenopathy.  Skin:    General: Skin is warm and dry.     Findings: No erythema or rash.  Neurological:     Mental Status: He is alert and oriented to person, place, and time.  Psychiatric:        Behavior: Behavior normal.        Thought Content: Thought content normal.        Judgment: Judgment normal.      Lab Results  Component Value Date   WBC 12.9 (H) 10/02/2022   HGB 12.1 (L) 10/02/2022   HCT 37.3 (L) 10/02/2022   MCV 111.0 (H) 10/02/2022   PLT 94 (L) 10/02/2022   Lab Results  Component Value Date   FERRITIN 21 (L) 09/03/2022   IRON 84 09/03/2022   TIBC 350 09/03/2022   UIBC 266 09/03/2022   IRONPCTSAT 24 09/03/2022   Lab Results  Component Value Date   RETICCTPCT 5.4 (H) 10/02/2022   RBC 3.36 (L) 10/02/2022   RBC 3.42 (L) 10/02/2022   Lab Results  Component Value Date   KPAFRELGTCHN 12.8 08/26/2021   LAMBDASER 24.9 08/26/2021   KAPLAMBRATIO 0.51 08/26/2021   Lab Results  Component Value Date   IGGSERUM 808 08/26/2021   IGA 149 08/26/2021   IGMSERUM 39 08/26/2021   No results found for: "TOTALPROTELP", "ALBUMINELP", "A1GS", "A2GS", "BETS", "BETA2SER", "GAMS", "MSPIKE", "SPEI"   Chemistry      Component Value Date/Time   NA 138 09/04/2022 1515   K 4.7 09/04/2022 1515   CL 101 09/04/2022 1515   CO2 28 09/04/2022 1515   BUN 23 09/04/2022 1515   CREATININE 0.99 09/04/2022 1515   CREATININE 1.03 08/15/2021 0000      Component Value Date/Time   CALCIUM 9.4 09/04/2022 1515   ALKPHOS 60 09/04/2022 1515   AST 13 (L) 09/04/2022 1515   ALT 13 09/04/2022 1515   BILITOT 1.2 09/04/2022 1515       Impression and Plan: Jason Abbott is a very pleasant 77 yo caucasian gentleman with stage C CLL.  He developed autoimmune hemolytic  anemia.  We started him on prednisone.  He had a very nice response.  We are still tapering him down.  I will now put him on 10 mg daily.  We will check his CBC every 2  weeks.  If we continue to see stability or improvement, we will taper his prednisone down slowly.  He continues on his folic acid.  At some point, I am sure we will have to treat the CLL.  However, we will focus on his hemolytic anemia for right now.  I would like to see him back in 6 weeks.  Hopefully by then, we may be getting close to being off, if not off, prednisone.    Josph Macho, MD 9/26/20242:14 PM

## 2022-10-09 ENCOUNTER — Inpatient Hospital Stay: Payer: Medicare Other

## 2022-10-16 ENCOUNTER — Telehealth: Payer: Self-pay

## 2022-10-16 ENCOUNTER — Inpatient Hospital Stay: Payer: Medicare Other | Admitting: Hematology & Oncology

## 2022-10-16 ENCOUNTER — Inpatient Hospital Stay: Payer: Medicare Other | Attending: Hematology & Oncology

## 2022-10-16 DIAGNOSIS — C9110 Chronic lymphocytic leukemia of B-cell type not having achieved remission: Secondary | ICD-10-CM | POA: Diagnosis present

## 2022-10-16 DIAGNOSIS — D591 Autoimmune hemolytic anemia, unspecified: Secondary | ICD-10-CM | POA: Diagnosis not present

## 2022-10-16 LAB — CBC WITH DIFFERENTIAL (CANCER CENTER ONLY)
Abs Immature Granulocytes: 0.08 10*3/uL — ABNORMAL HIGH (ref 0.00–0.07)
Basophils Absolute: 0.1 10*3/uL (ref 0.0–0.1)
Basophils Relative: 0 %
Eosinophils Absolute: 0.1 10*3/uL (ref 0.0–0.5)
Eosinophils Relative: 0 %
HCT: 36.4 % — ABNORMAL LOW (ref 39.0–52.0)
Hemoglobin: 11.9 g/dL — ABNORMAL LOW (ref 13.0–17.0)
Immature Granulocytes: 1 %
Lymphocytes Relative: 72 %
Lymphs Abs: 12.7 10*3/uL — ABNORMAL HIGH (ref 0.7–4.0)
MCH: 35.8 pg — ABNORMAL HIGH (ref 26.0–34.0)
MCHC: 32.7 g/dL (ref 30.0–36.0)
MCV: 109.6 fL — ABNORMAL HIGH (ref 80.0–100.0)
Monocytes Absolute: 1.4 10*3/uL — ABNORMAL HIGH (ref 0.1–1.0)
Monocytes Relative: 8 %
Neutro Abs: 3.3 10*3/uL (ref 1.7–7.7)
Neutrophils Relative %: 19 %
Platelet Count: 98 10*3/uL — ABNORMAL LOW (ref 150–400)
RBC: 3.32 MIL/uL — ABNORMAL LOW (ref 4.22–5.81)
RDW: 13.2 % (ref 11.5–15.5)
Smear Review: NORMAL
WBC Count: 17.5 10*3/uL — ABNORMAL HIGH (ref 4.0–10.5)
nRBC: 0 % (ref 0.0–0.2)

## 2022-10-16 LAB — RETIC PANEL
Immature Retic Fract: 21.2 % — ABNORMAL HIGH (ref 2.3–15.9)
RBC.: 3.32 MIL/uL — ABNORMAL LOW (ref 4.22–5.81)
Retic Count, Absolute: 160.4 10*3/uL (ref 19.0–186.0)
Retic Ct Pct: 4.8 % — ABNORMAL HIGH (ref 0.4–3.1)
Reticulocyte Hemoglobin: 33.4 pg (ref >27.9)

## 2022-10-16 NOTE — Telephone Encounter (Signed)
Pt dropped off forms today at lab apt and stated that Dr Myna Hidalgo said if his labs were normal then he would sign off for him to drive again. Per Dr Myna Hidalgo- he does not plan to sign off on this as he needs to be off Prednisone and he should have these forms completed by his PCP or neurologist. Called pt and LM stating above (ok per DPR) and requested a CB with any concerns or if he would like to schedule a f/u to discuss further with Dr Myna Hidalgo. Reminded pt of next apt with Dr Myna Hidalgo on 11/13/22.

## 2022-10-17 ENCOUNTER — Telehealth: Payer: Self-pay | Admitting: *Deleted

## 2022-10-17 ENCOUNTER — Other Ambulatory Visit: Payer: Self-pay | Admitting: *Deleted

## 2022-10-17 DIAGNOSIS — C9110 Chronic lymphocytic leukemia of B-cell type not having achieved remission: Secondary | ICD-10-CM

## 2022-10-17 MED ORDER — PREDNISONE 5 MG PO TABS
5.0000 mg | ORAL_TABLET | Freq: Every day | ORAL | 0 refills | Status: DC
Start: 2022-10-18 — End: 2022-11-12

## 2022-10-17 NOTE — Telephone Encounter (Signed)
-----   Message from Josph Macho sent at 10/16/2022  5:01 PM EDT ----- How much prednisone is he on right now.?  Thanks.[name]

## 2022-10-17 NOTE — Telephone Encounter (Signed)
Per Dr. Myna Hidalgo, please find out how much Prednisone he is taking right now. I called patient and he is not sure how much Prednisone he is taking. His wife handles his medications and she is not at home right now. I told him I would call back later to find out. He verbalized understanding.

## 2022-10-17 NOTE — Telephone Encounter (Signed)
Per Dr. Myna Hidalgo, have patient start taking Prednisone 5 mg tablets by mouth daily, starting on 10/18/22. Prescription was E-scribed to his local pharmacy. Wife verbalized understanding of directions.

## 2022-10-21 ENCOUNTER — Encounter: Payer: Self-pay | Admitting: Hematology & Oncology

## 2022-10-22 ENCOUNTER — Ambulatory Visit (INDEPENDENT_AMBULATORY_CARE_PROVIDER_SITE_OTHER): Payer: Medicare Other | Admitting: Sports Medicine

## 2022-10-22 ENCOUNTER — Encounter: Payer: Self-pay | Admitting: Sports Medicine

## 2022-10-22 VITALS — BP 144/84 | HR 84 | Ht 70.0 in | Wt 182.0 lb

## 2022-10-22 DIAGNOSIS — Z0000 Encounter for general adult medical examination without abnormal findings: Secondary | ICD-10-CM

## 2022-10-22 DIAGNOSIS — F19951 Other psychoactive substance use, unspecified with psychoactive substance-induced psychotic disorder with hallucinations: Secondary | ICD-10-CM | POA: Insufficient documentation
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
## 2022-10-22 NOTE — Assessment & Plan Note (Signed)
We will have Jason Abbott return for fasting annual physical to get caught up on all of his preventive metrics.

## 2022-10-30 ENCOUNTER — Inpatient Hospital Stay: Payer: Medicare Other

## 2022-10-30 DIAGNOSIS — D591 Autoimmune hemolytic anemia, unspecified: Secondary | ICD-10-CM | POA: Diagnosis not present

## 2022-10-30 DIAGNOSIS — C9110 Chronic lymphocytic leukemia of B-cell type not having achieved remission: Secondary | ICD-10-CM | POA: Diagnosis not present

## 2022-10-30 LAB — CBC WITH DIFFERENTIAL (CANCER CENTER ONLY)
Abs Immature Granulocytes: 0.05 10*3/uL (ref 0.00–0.07)
Basophils Absolute: 0.1 10*3/uL (ref 0.0–0.1)
Basophils Relative: 0 %
Eosinophils Absolute: 0.1 10*3/uL (ref 0.0–0.5)
Eosinophils Relative: 0 %
HCT: 36.3 % — ABNORMAL LOW (ref 39.0–52.0)
Hemoglobin: 12.2 g/dL — ABNORMAL LOW (ref 13.0–17.0)
Immature Granulocytes: 0 %
Lymphocytes Relative: 77 %
Lymphs Abs: 17.9 10*3/uL — ABNORMAL HIGH (ref 0.7–4.0)
MCH: 38 pg — ABNORMAL HIGH (ref 26.0–34.0)
MCHC: 33.6 g/dL (ref 30.0–36.0)
MCV: 113.1 fL — ABNORMAL HIGH (ref 80.0–100.0)
Monocytes Absolute: 2 10*3/uL — ABNORMAL HIGH (ref 0.1–1.0)
Monocytes Relative: 9 %
Neutro Abs: 3.2 10*3/uL (ref 1.7–7.7)
Neutrophils Relative %: 14 %
Platelet Count: 114 10*3/uL — ABNORMAL LOW (ref 150–400)
RBC: 3.21 MIL/uL — ABNORMAL LOW (ref 4.22–5.81)
RDW: 15.1 % (ref 11.5–15.5)
Smear Review: NORMAL
WBC Count: 23.3 10*3/uL — ABNORMAL HIGH (ref 4.0–10.5)
nRBC: 0 % (ref 0.0–0.2)

## 2022-10-30 LAB — RETIC PANEL
Immature Retic Fract: 24.7 % — ABNORMAL HIGH (ref 2.3–15.9)
RBC.: 3.21 MIL/uL — ABNORMAL LOW (ref 4.22–5.81)
Retic Count, Absolute: 186.2 10*3/uL — ABNORMAL HIGH (ref 19.0–186.0)
Retic Ct Pct: 5.8 % — ABNORMAL HIGH (ref 0.4–3.1)
Reticulocyte Hemoglobin: 33.7 pg (ref >27.9)

## 2022-11-05 ENCOUNTER — Ambulatory Visit (INDEPENDENT_AMBULATORY_CARE_PROVIDER_SITE_OTHER): Payer: Medicare Other | Admitting: Sports Medicine

## 2022-11-05 ENCOUNTER — Encounter: Payer: Self-pay | Admitting: Sports Medicine

## 2022-11-05 VITALS — BP 119/72 | HR 73 | Ht 70.0 in | Wt 189.0 lb

## 2022-11-05 DIAGNOSIS — Z0000 Encounter for general adult medical examination without abnormal findings: Secondary | ICD-10-CM

## 2022-11-05 DIAGNOSIS — E782 Mixed hyperlipidemia: Secondary | ICD-10-CM

## 2022-11-05 DIAGNOSIS — R739 Hyperglycemia, unspecified: Secondary | ICD-10-CM | POA: Diagnosis not present

## 2022-11-05 MED ORDER — SHINGRIX 50 MCG/0.5ML IM SUSR: 0.5000 mL | Freq: Once | INTRAMUSCULAR | 0 refills | Status: AC

## 2022-11-05 MED ORDER — RSV PRE-FUSION F A&B VAC RCMB 120 MCG/0.5ML IM SOLR
0.5000 mL | Freq: Once | INTRAMUSCULAR | 0 refills | Status: AC
Start: 2022-11-05 — End: 2022-11-05

## 2022-11-05 NOTE — Assessment & Plan Note (Signed)
Has historically been on lovastatin high dose, this was discontinued at some point along the way, we will check his lipids and restart if needed.

## 2022-11-05 NOTE — Progress Notes (Signed)
Subjective:    CC: Annual Physical Exam  HPI:  This patient is here for their annual physical  I reviewed the past medical history, family history, social history, surgical history, and allergies today and no changes were needed.  Please see the problem list section below in epic for further details.  Past Medical History: Past Medical History:  Diagnosis Date   Aberrant right subclavian artery    Aortic arch aneurysm (HCC)    Arthritis    Bowel obstruction (HCC)    CLL (chronic lymphocytic leukemia) (HCC)    COVID 2021   hospitalized for it   Diverticul disease small and large intestine, no perforati or abscess    GERD (gastroesophageal reflux disease)    uses tums as needed   H/O urinary infection    Pneumonia 09/22/2021   Shingles outbreak 10/03/2013   Stroke (HCC) 01/2019   Past Surgical History: Past Surgical History:  Procedure Laterality Date   AORTIC ARCH DEBRANCHING N/A 01/10/2022   Procedure: AORTIC ARCH DEBRANCHING WITH 16 X 8 X 10 MM HEMASHIELD GOLD GRAFT;  Surgeon: Alleen Borne, MD;  Location: [facility] OR;  Service: Open Heart Surgery;  Laterality: N/A;   CAROTID-SUBCLAVIAN BYPASS GRAFT Right 12/18/2021   Procedure: RIGHT BYPASS GRAFT CAROTID-SUBCLAVIAN;  Surgeon: Nada Libman, MD;  Location: [facility] OR;  Service: Vascular;  Laterality: Right;   CARPAL TUNNEL RELEASE Right 11/25/2021   Procedure: CARPAL TUNNEL RELEASE;  Surgeon: Marlyne Beards, MD;  Location: [facility] OR;  Service: Orthopedics;  Laterality: Right;  or MAC with regional block 60   HERNIA REPAIR     MECKEL DIVERTICULUM EXCISION     ORIF FINGER / THUMB FRACTURE     TEE WITHOUT CARDIOVERSION N/A 01/10/2022   Procedure: TRANSESOPHAGEAL ECHOCARDIOGRAM (TEE);  Surgeon: Alleen Borne, MD;  Location[facility];  Service: Open Heart Surgery;  Laterality: N/A;   TENOSYNOVECTOMY Right 11/25/2021   Procedure: FLEXOR TENOSYNOVECTOMY;  Surgeon: Marlyne Beards, MD;  Location: [facility] OR;  Service: Orthopedics;  Laterality:  Right;  or MAC with regional block 60   THORACIC AORTIC ENDOVASCULAR STENT GRAFT N/A 01/10/2022   Procedure: THORACIC AORTIC ENDOVASCULAR STENT GRAFT;  Surgeon: Nada Libman, MD;  Location: [facility] OR;  Service: Vascular;  Laterality: N/A;   ulna nerve surgery     Social History: Social History   Socioeconomic History   Marital status: Married    Spouse name: Boneta Lucks   Number of children: 1   Years of education: 14   Highest education level: Associate degree: occupational, Scientist, product/process development, or vocational program  Occupational History    Comment: retired Electronics engineer, Solicitor,  Tobacco Use   Smoking status: Former    Current packs/day: 0.00    Average packs/day: 0.5 packs/day for 23.0 years (11.5 ttl pk-yrs)    Types: Cigarettes    Start date: 1966    Quit date: 1989    Years since quitting: 35.8   Smokeless tobacco: Never  Vaping Use   Vaping status: Never Used  Substance and Sexual Activity   Alcohol use: Not Currently    Comment: rarely   Drug use: No   Sexual activity: Not Currently  Other Topics Concern   Not on file  Social History Narrative   Lives with his wife. His son lives in [place]. He enjoys music.   Social Determinants of Health   Financial Resource Strain: Low Risk  (11/04/2022)   Overall Financial Resource Strain (CARDIA)    Difficulty of Paying Living Expenses: Not hard  at all  Food Insecurity: No Food Insecurity (11/04/2022)   Hunger Vital Sign    Worried About Running Out of Food in the Last Year: Never true    Ran Out of Food in the Last Year: Never true  Transportation Needs: No Transportation Needs (11/04/2022)   PRAPARE - Administrator, Civil Service (Medical): No    Lack of Transportation (Non-Medical): No  Physical Activity: Insufficiently Active (11/04/2022)   Exercise Vital Sign    Days of Exercise per Week: 1 day    Minutes of Exercise per Session: 30 min  Stress: No Stress Concern Present (11/04/2022)   [employer] of Occupational Health -  Occupational Stress Questionnaire    Feeling of Stress : Not at all  Social Connections: Unknown (11/04/2022)   Social Connection and Isolation Panel [NHANES]    Frequency of Communication with Friends and Family: Twice a week    Frequency of Social Gatherings with Friends and Family: Once a week    Attends Religious Services: Patient declined    Database administrator or Organizations: No    Attends Banker Meetings: Never    Marital Status: Married   Family History: Family History  Problem Relation Age of Onset   Cancer Mother        pancreatic   COPD Father    Diabetes Sister    Diabetes Brother    Diabetes Brother    Allergies: Allergies  Allergen Reactions   Levaquin [Levofloxacin] Other (See Comments)    Due to medical hx   Medications: See med rec.  Review of Systems: No headache, visual changes, nausea, vomiting, diarrhea, constipation, dizziness, abdominal pain, skin rash, fevers, chills, night sweats, swollen lymph nodes, weight loss, chest pain, body aches, joint swelling, muscle aches, shortness of breath, mood changes, visual or auditory hallucinations.  Objective:    General: Well Developed, well nourished, and in no acute distress.  Neuro: Alert and oriented x3, extra-ocular muscles intact, sensation grossly intact. Cranial nerves II through XII are intact, motor, sensory, and coordinative functions are all intact. HEENT: Normocephalic, atraumatic, pupils equal round reactive to light, neck supple, no masses, no lymphadenopathy, thyroid nonpalpable. Oropharynx, nasopharynx, external ear canals are unremarkable. Skin: Warm and dry, no rashes noted.  Cardiac: Regular rate and rhythm, no murmurs rubs or gallops.  Respiratory: Clear to auscultation bilaterally. Not using accessory muscles, speaking in full sentences.  Abdominal: Soft, nontender, nondistended, positive bowel sounds, no masses, no organomegaly.  Musculoskeletal: Shoulder, elbow, wrist,  hip, knee, ankle stable, and with full range of motion.  Impression and Recommendations:    The patient was counselled, risk factors were discussed, anticipatory guidance given.  Annual physical exam Annual physical exam as above, up-to-date on screenings, he recently got Prevnar 20, declines flu, he is interested in RSV and Shingrix, we will send these to his pharmacy.  Hyperlipidemia, mixed Has historically been on lovastatin high dose, this was discontinued at some point along the way, we will check his lipids and restart if needed.   ____________________________________________ Jason Abbott. Jason Abbott, M.D., ABFM., CAQSM., AME. Primary Care and Sports Medicine [facility]  Adjunct Professor of Family Medicine  [employer] [facility] [employer]  Restaurant manager, fast food

## 2022-11-05 NOTE — Assessment & Plan Note (Addendum)
Annual physical exam as above, up-to-date on screenings, he recently got Prevnar 20, declines flu, he is interested in RSV and Shingrix, we will send these to his pharmacy.

## 2022-11-06 LAB — COMPREHENSIVE METABOLIC PANEL
ALT: 13 [IU]/L (ref 0–44)
AST: 19 [IU]/L (ref 0–40)
Albumin: 4.3 g/dL (ref 3.8–4.8)
Alkaline Phosphatase: 84 [IU]/L (ref 44–121)
BUN/Creatinine Ratio: 16 (ref 10–24)
BUN: 18 mg/dL (ref 8–27)
Bilirubin Total: 0.8 mg/dL (ref 0.0–1.2)
CO2: 26 mmol/L (ref 20–29)
Calcium: 9.1 mg/dL (ref 8.6–10.2)
Chloride: 102 mmol/L (ref 96–106)
Creatinine, Ser: 1.13 mg/dL (ref 0.76–1.27)
Globulin, Total: 1.7 g/dL (ref 1.5–4.5)
Glucose: 87 mg/dL (ref 70–99)
Potassium: 4.2 mmol/L (ref 3.5–5.2)
Sodium: 142 mmol/L (ref 134–144)
Total Protein: 6 g/dL (ref 6.0–8.5)
eGFR: 67 mL/min/{1.73_m2} (ref >59)

## 2022-11-06 LAB — LIPID PANEL
Chol/HDL Ratio: 3.5 ratio (ref 0.0–5.0)
Cholesterol, Total: 142 mg/dL (ref 100–199)
HDL: 41 mg/dL (ref >39)
LDL Chol Calc (NIH): 84 mg/dL (ref 0–99)
Triglycerides: 92 mg/dL (ref 0–149)
VLDL Cholesterol Cal: 17 mg/dL (ref 5–40)

## 2022-11-06 LAB — HEMOGLOBIN A1C
Est. average glucose Bld gHb Est-mCnc: 80 mg/dL
Hgb A1c MFr Bld: 4.4 % — ABNORMAL LOW (ref 4.8–5.6)

## 2022-11-06 LAB — TSH: TSH: 3.43 u[IU]/mL (ref 0.450–4.500)

## 2022-11-12 ENCOUNTER — Other Ambulatory Visit: Payer: Self-pay | Admitting: Hematology & Oncology

## 2022-11-12 DIAGNOSIS — C9110 Chronic lymphocytic leukemia of B-cell type not having achieved remission: Secondary | ICD-10-CM

## 2022-11-13 ENCOUNTER — Encounter: Payer: Self-pay | Admitting: Hematology & Oncology

## 2022-11-13 ENCOUNTER — Inpatient Hospital Stay: Payer: Medicare Other | Attending: Hematology & Oncology

## 2022-11-13 ENCOUNTER — Inpatient Hospital Stay (HOSPITAL_BASED_OUTPATIENT_CLINIC_OR_DEPARTMENT_OTHER): Payer: Medicare Other | Admitting: Hematology & Oncology

## 2022-11-13 VITALS — BP 140/74 | HR 72 | Temp 98.4°F | Resp 18 | Wt 192.0 lb

## 2022-11-13 DIAGNOSIS — C9110 Chronic lymphocytic leukemia of B-cell type not having achieved remission: Secondary | ICD-10-CM

## 2022-11-13 DIAGNOSIS — R530 Neoplastic (malignant) related fatigue: Secondary | ICD-10-CM | POA: Diagnosis not present

## 2022-11-13 DIAGNOSIS — Z7952 Long term (current) use of systemic steroids: Secondary | ICD-10-CM | POA: Diagnosis not present

## 2022-11-13 DIAGNOSIS — Z79899 Other long term (current) drug therapy: Secondary | ICD-10-CM | POA: Diagnosis not present

## 2022-11-13 DIAGNOSIS — Z7982 Long term (current) use of aspirin: Secondary | ICD-10-CM | POA: Diagnosis not present

## 2022-11-13 DIAGNOSIS — D591 Autoimmune hemolytic anemia, unspecified: Secondary | ICD-10-CM | POA: Insufficient documentation

## 2022-11-13 DIAGNOSIS — R531 Weakness: Secondary | ICD-10-CM | POA: Diagnosis not present

## 2022-11-13 DIAGNOSIS — Z5111 Encounter for antineoplastic chemotherapy: Secondary | ICD-10-CM | POA: Insufficient documentation

## 2022-11-13 DIAGNOSIS — Z5112 Encounter for antineoplastic immunotherapy: Secondary | ICD-10-CM | POA: Diagnosis not present

## 2022-11-13 LAB — CBC WITH DIFFERENTIAL (CANCER CENTER ONLY)
Abs Immature Granulocytes: 0.06 10*3/uL (ref 0.00–0.07)
Basophils Absolute: 0.1 10*3/uL (ref 0.0–0.1)
Basophils Relative: 0 %
Eosinophils Absolute: 0.1 10*3/uL (ref 0.0–0.5)
Eosinophils Relative: 0 %
HCT: 33.8 % — ABNORMAL LOW (ref 39.0–52.0)
Hemoglobin: 11.4 g/dL — ABNORMAL LOW (ref 13.0–17.0)
Immature Granulocytes: 0 %
Lymphocytes Relative: 79 %
Lymphs Abs: 20 10*3/uL — ABNORMAL HIGH (ref 0.7–4.0)
MCH: 36 pg — ABNORMAL HIGH (ref 26.0–34.0)
MCHC: 33.7 g/dL (ref 30.0–36.0)
MCV: 106.6 fL — ABNORMAL HIGH (ref 80.0–100.0)
Monocytes Absolute: 1.8 10*3/uL — ABNORMAL HIGH (ref 0.1–1.0)
Monocytes Relative: 7 %
Neutro Abs: 3.6 10*3/uL (ref 1.7–7.7)
Neutrophils Relative %: 14 %
Platelet Count: 97 10*3/uL — ABNORMAL LOW (ref 150–400)
RBC: 3.17 MIL/uL — ABNORMAL LOW (ref 4.22–5.81)
RDW: 15.2 % (ref 11.5–15.5)
Smear Review: NORMAL
WBC Count: 25.5 10*3/uL — ABNORMAL HIGH (ref 4.0–10.5)
nRBC: 0 % (ref 0.0–0.2)

## 2022-11-13 LAB — CMP (CANCER CENTER ONLY)
ALT: 9 U/L (ref 0–44)
AST: 15 U/L (ref 15–41)
Albumin: 4.7 g/dL (ref 3.5–5.0)
Alkaline Phosphatase: 62 U/L (ref 38–126)
Anion gap: 4 — ABNORMAL LOW (ref 5–15)
BUN: 20 mg/dL (ref 8–23)
CO2: 32 mmol/L (ref 22–32)
Calcium: 9.6 mg/dL (ref 8.9–10.3)
Chloride: 103 mmol/L (ref 98–111)
Creatinine: 1.08 mg/dL (ref 0.61–1.24)
GFR, Estimated: >60 mL/min (ref >60)
Glucose, Bld: 121 mg/dL — ABNORMAL HIGH (ref 70–99)
Potassium: 4.8 mmol/L (ref 3.5–5.1)
Sodium: 139 mmol/L (ref 135–145)
Total Bilirubin: 1 mg/dL (ref <1.2)
Total Protein: 6.5 g/dL (ref 6.5–8.1)

## 2022-11-13 LAB — RETIC PANEL
Immature Retic Fract: 29.4 % — ABNORMAL HIGH (ref 2.3–15.9)
RBC.: 3.15 MIL/uL — ABNORMAL LOW (ref 4.22–5.81)
Retic Count, Absolute: 195 10*3/uL — ABNORMAL HIGH (ref 19.0–186.0)
Retic Ct Pct: 6.2 % — ABNORMAL HIGH (ref 0.4–3.1)
Reticulocyte Hemoglobin: 35.7 pg (ref >27.9)

## 2022-11-13 LAB — SAVE SMEAR(SSMR), FOR PROVIDER SLIDE REVIEW

## 2022-11-13 NOTE — Progress Notes (Signed)
Hematology and Oncology Follow Up Visit  Jason Abbott [id number] 1945-12-01 77 y.o. 11/13/2022   Principle Diagnosis:  Stage C CLL -autoimmune hemolytic anemia  Current Therapy:   Prednisone 80 mg p.o. daily-started on 08/20/2022 --decrease to 40 mg p.o. daily on 09/04/2022 Prednisone 10 mg p.o. daily-changes on 10/02/2022 -DC on 11/13/2022 Rituxan/Bendamustine-start treatment on 11/20/2022   Interim History:  Jason Abbott is here today for follow-up.  He is doing okay.  He is on 5 mg a day of prednisone right now.  However, looks like that his white cell count is going up.  This really troubles me.  In addition, his hemoglobin is down a little bit.  His reticulocyte count is up a little bit.  I really think that we are to have to initiate treatment on him.  I feel confident that we can get him into remission.  I think he would do okay on Rituxan/Bendamustine.  This is a fairly active protocol.  We can do this 2 days a month.  This would certainly fit in with his busy schedule.  He has had no fever.  He has had no change in bowel or bladder habits.  He has had no cough or shortness of breath..  He does get fatigued.  I suspect this probably is from the CLL.  He has not noted any swollen lymph nodes.  He has had no leg swelling.  He has had no bleeding.  There is been no rashes.  Overall, I would say his performance status is probably ECOG 1.  Medications:  Allergies as of 11/13/2022       Reactions   Levaquin [levofloxacin] Other (See Comments)   Due to medical hx        Medication List        Accurate as of November 13, 2022  3:19 PM. If you have any questions, ask your nurse or doctor.          aspirin EC 81 MG tablet Take 1 tablet (81 mg total) by mouth daily at 6 (six) AM. Swallow whole.   AZO CRANBERRY PO Take 1 each by mouth in the morning.   cephALEXin 250 MG capsule Commonly known as: KEFLEX Take 1 capsule (250 mg total) by mouth daily.   doxylamine  (Sleep) 25 MG tablet Commonly known as: UNISOM Take 25 mg by mouth at bedtime as needed.   famciclovir 250 MG tablet Commonly known as: FAMVIR TAKE 1 TABLET BY MOUTH TWICE A DAY   finasteride 5 MG tablet Commonly known as: PROSCAR Take 1 tablet (5 mg total) by mouth daily.   fluconazole 100 MG tablet Commonly known as: DIFLUCAN Take 1 tablet (100 mg total) by mouth daily.   folic acid 1 MG tablet Commonly known as: FOLVITE Take 2 tablets (2 mg total) by mouth daily.   MULTIVITAMIN GUMMIES ADULTS PO Take 2 tablets by mouth in the morning.   predniSONE 5 MG tablet Commonly known as: DELTASONE TAKE 1 TABLET BY MOUTH EVERY DAY WITH BREAKFAST   tamsulosin 0.4 MG Caps capsule Commonly known as: FLOMAX Take 1 capsule (0.4 mg total) by mouth in the morning and at bedtime.   VITAMIN B 12 PO Take 1 tablet by mouth in the morning.        Allergies:  Allergies  Allergen Reactions   Levaquin [Levofloxacin] Other (See Comments)    Due to medical hx    Past Medical History, Surgical history, Social history, and Family History were  reviewed and updated.  Review of Systems: Review of Systems  Constitutional: Negative.   HENT: Negative.    Eyes: Negative.   Respiratory: Negative.    Cardiovascular: Negative.   Gastrointestinal: Negative.   Genitourinary: Negative.   Musculoskeletal: Negative.   Skin: Negative.   Neurological: Negative.   Endo/Heme/Allergies: Negative.   Psychiatric/Behavioral: Negative.       Physical Exam:  weight is 192 lb (87.1 kg). His oral temperature is 98.4 F (36.9 C). His blood pressure is 140/74 (abnormal) and his pulse is 72. His respiration is 18 and oxygen saturation is 98%.   Wt Readings from Last 3 Encounters:  11/13/22 192 lb (87.1 kg)  11/05/22 189 lb (85.7 kg)  10/22/22 182 lb (82.6 kg)    Physical Exam Vitals reviewed.  HENT:     Head: Normocephalic and atraumatic.  Eyes:     Pupils: Pupils are equal, round, and reactive  to light.  Cardiovascular:     Rate and Rhythm: Normal rate and regular rhythm.     Heart sounds: Normal heart sounds.  Pulmonary:     Effort: Pulmonary effort is normal.     Breath sounds: Normal breath sounds.  Abdominal:     General: Bowel sounds are normal.     Palpations: Abdomen is soft.  Musculoskeletal:        General: No tenderness or deformity. Normal range of motion.     Cervical back: Normal range of motion.  Lymphadenopathy:     Cervical: No cervical adenopathy.  Skin:    General: Skin is warm and dry.     Findings: No erythema or rash.  Neurological:     Mental Status: He is alert and oriented to person, place, and time.  Psychiatric:        Behavior: Behavior normal.        Thought Content: Thought content normal.        Judgment: Judgment normal.     Lab Results  Component Value Date   WBC 25.5 (H) 11/13/2022   HGB 11.4 (L) 11/13/2022   HCT 33.8 (L) 11/13/2022   MCV 106.6 (H) 11/13/2022   PLT 97 (L) 11/13/2022   Lab Results  Component Value Date   FERRITIN 21 (L) 09/03/2022   IRON 84 09/03/2022   TIBC 350 09/03/2022   UIBC 266 09/03/2022   IRONPCTSAT 24 09/03/2022   Lab Results  Component Value Date   RETICCTPCT 6.2 (H) 11/13/2022   RBC 3.15 (L) 11/13/2022   Lab Results  Component Value Date   KPAFRELGTCHN 12.8 08/26/2021   LAMBDASER 24.9 08/26/2021   KAPLAMBRATIO 0.51 08/26/2021   Lab Results  Component Value Date   IGGSERUM 808 08/26/2021   IGA 149 08/26/2021   IGMSERUM 39 08/26/2021   No results found for: "TOTALPROTELP", "ALBUMINELP", "A1GS", "A2GS", "BETS", "BETA2SER", "GAMS", "MSPIKE", "SPEI"   Chemistry      Component Value Date/Time   NA 142 11/05/2022 1127   K 4.2 11/05/2022 1127   CL 102 11/05/2022 1127   CO2 26 11/05/2022 1127   BUN 18 11/05/2022 1127   CREATININE 1.13 11/05/2022 1127   CREATININE 1.07 10/02/2022 1336   CREATININE 1.03 08/15/2021 0000      Component Value Date/Time   CALCIUM 9.1 11/05/2022 1127    ALKPHOS 84 11/05/2022 1127   AST 19 11/05/2022 1127   AST 15 10/02/2022 1336   ALT 13 11/05/2022 1127   ALT 12 10/02/2022 1336   BILITOT 0.8 11/05/2022 1127  BILITOT 0.9 10/02/2022 1336       Impression and Plan: Jason Abbott is a very pleasant 77 yo caucasian gentleman with stage C CLL.  He developed autoimmune hemolytic anemia.  We started him on prednisone.  He had a very nice response.  However, I think that we now at a point where the prednisone at the current dose is really not helping.  Again, I just do not want him on long-term prednisone.  I think we now have to think about treating the actual CLL.  Again I think that Rituxan/Bendamustine would not be a bad idea.  I think this would be well-tolerated.  I think that he would have a very good success rate-over 90%.  I told he and his wife about this.  They are certainly understandable as to why we need to start treatment.  Know this is a holiday season and it is quite busy for him.  Again, I think doing treatment just 2 days a month would be amenable to his busy schedule.  1 over toxicity.  I explained to him the first time he gets Rituxan, it can take about 6 hours.  After that, he should take hopefully no more than an hour and half to 2 hours..  I told him that he would not lose his hair.  He is already on Diflucan and Famvir.   I will have to send off a CLL prognostic panel.  We will send this off when he gets his first treatment.  We will try to get started next week.  Again, we can certainly work around the holiday season.  I just feel very confident that we will be able to get him into remission.    Josph Macho, MD 11/7/20243:19 PM

## 2022-11-13 NOTE — Progress Notes (Signed)
START OFF PATHWAY REGIMEN - Lymphoma and CLL   [id number]:Bendamustine 90 mg/m2 IV D1,2 + Rituximab (IV/SUBQ) D1 q28 Days x 6 Cycles:   Cycle 1: A cycle is 28 days:     Rituximab-xxxx      Bendamustine    Cycles 2 through 6: A cycle is every 28 days:     Rituximab and hyaluronidase human      Bendamustine   **Always confirm dose/schedule in your pharmacy ordering system**  Patient Characteristics: Chronic Lymphocytic Leukemia (CLL), Treatment Indicated, First Line Disease Type: Chronic Lymphocytic Leukemia (CLL) Disease Type: Not Applicable Disease Type: Not Applicable Treatment Indicated<= Treatment Indicated Line of Therapy: First Line Intent of Therapy: Non-Curative / Palliative Intent, Discussed with Patient

## 2022-11-14 ENCOUNTER — Other Ambulatory Visit: Payer: Self-pay

## 2022-11-17 NOTE — Progress Notes (Signed)
Pharmacist Chemotherapy Monitoring - Initial Assessment    Anticipated start date: 11/20/2022   The following has been reviewed per standard work regarding the patient's treatment regimen: The patient's diagnosis, treatment plan and drug doses, and organ/hematologic function Lab orders and baseline tests specific to treatment regimen  The treatment plan start date, drug sequencing, and pre-medications Prior authorization status  Patient's documented medication list, including drug-drug interaction screen and prescriptions for anti-emetics and supportive care specific to the treatment regimen The drug concentrations, fluid compatibility, administration routes, and timing of the medications to be used The patient's access for treatment and lifetime cumulative dose history, if applicable  The patient's medication allergies and previous infusion related reactions, if applicable   Changes made to treatment plan:  N/A  Follow up needed:  F/u hep B panel   Janeice Robinson, RPH, 11/17/2022  9:37 AM

## 2022-11-20 ENCOUNTER — Encounter: Payer: Self-pay | Admitting: *Deleted

## 2022-11-20 ENCOUNTER — Encounter: Payer: Self-pay | Admitting: Hematology & Oncology

## 2022-11-20 ENCOUNTER — Inpatient Hospital Stay: Payer: Medicare Other

## 2022-11-20 ENCOUNTER — Other Ambulatory Visit: Payer: Self-pay | Admitting: Oncology

## 2022-11-20 VITALS — BP 92/58 | HR 91 | Temp 98.1°F | Resp 18

## 2022-11-20 DIAGNOSIS — Z5112 Encounter for antineoplastic immunotherapy: Secondary | ICD-10-CM | POA: Diagnosis not present

## 2022-11-20 DIAGNOSIS — Z7952 Long term (current) use of systemic steroids: Secondary | ICD-10-CM | POA: Diagnosis not present

## 2022-11-20 DIAGNOSIS — Z5111 Encounter for antineoplastic chemotherapy: Secondary | ICD-10-CM | POA: Diagnosis not present

## 2022-11-20 DIAGNOSIS — C9110 Chronic lymphocytic leukemia of B-cell type not having achieved remission: Secondary | ICD-10-CM | POA: Diagnosis not present

## 2022-11-20 DIAGNOSIS — R530 Neoplastic (malignant) related fatigue: Secondary | ICD-10-CM

## 2022-11-20 DIAGNOSIS — R531 Weakness: Secondary | ICD-10-CM | POA: Diagnosis not present

## 2022-11-20 DIAGNOSIS — D591 Autoimmune hemolytic anemia, unspecified: Secondary | ICD-10-CM | POA: Diagnosis not present

## 2022-11-20 LAB — CMP (CANCER CENTER ONLY)
ALT: 10 U/L (ref 0–44)
AST: 18 U/L (ref 15–41)
Albumin: 4.6 g/dL (ref 3.5–5.0)
Alkaline Phosphatase: 73 U/L (ref 38–126)
Anion gap: 5 (ref 5–15)
BUN: 16 mg/dL (ref 8–23)
CO2: 31 mmol/L (ref 22–32)
Calcium: 9.7 mg/dL (ref 8.9–10.3)
Chloride: 105 mmol/L (ref 98–111)
Creatinine: 1.13 mg/dL (ref 0.61–1.24)
GFR, Estimated: >60 mL/min (ref >60)
Glucose, Bld: 97 mg/dL (ref 70–99)
Potassium: 3.9 mmol/L (ref 3.5–5.1)
Sodium: 141 mmol/L (ref 135–145)
Total Bilirubin: 1.1 mg/dL (ref <1.2)
Total Protein: 6.4 g/dL — ABNORMAL LOW (ref 6.5–8.1)

## 2022-11-20 LAB — CBC WITH DIFFERENTIAL (CANCER CENTER ONLY)
Abs Immature Granulocytes: 0.07 10*3/uL (ref 0.00–0.07)
Basophils Absolute: 0.1 10*3/uL (ref 0.0–0.1)
Basophils Relative: 0 %
Eosinophils Absolute: 0.2 10*3/uL (ref 0.0–0.5)
Eosinophils Relative: 1 %
HCT: 31.9 % — ABNORMAL LOW (ref 39.0–52.0)
Hemoglobin: 10.7 g/dL — ABNORMAL LOW (ref 13.0–17.0)
Immature Granulocytes: 0 %
Lymphocytes Relative: 86 %
Lymphs Abs: 39.4 10*3/uL — ABNORMAL HIGH (ref 0.7–4.0)
MCH: 36 pg — ABNORMAL HIGH (ref 26.0–34.0)
MCHC: 33.5 g/dL (ref 30.0–36.0)
MCV: 107.4 fL — ABNORMAL HIGH (ref 80.0–100.0)
Monocytes Absolute: 3.5 10*3/uL — ABNORMAL HIGH (ref 0.1–1.0)
Monocytes Relative: 8 %
Neutro Abs: 2.4 10*3/uL (ref 1.7–7.7)
Neutrophils Relative %: 5 %
Platelet Count: 86 10*3/uL — ABNORMAL LOW (ref 150–400)
RBC: 2.97 MIL/uL — ABNORMAL LOW (ref 4.22–5.81)
RDW: 16.5 % — ABNORMAL HIGH (ref 11.5–15.5)
Smear Review: NORMAL
WBC Count: 45.7 10*3/uL — ABNORMAL HIGH (ref 4.0–10.5)
nRBC: 0 % (ref 0.0–0.2)

## 2022-11-20 LAB — HEPATITIS PANEL, ACUTE
HCV Ab: NONREACTIVE
Hep A IgM: NONREACTIVE
Hep B C IgM: NONREACTIVE
Hepatitis B Surface Ag: NONREACTIVE

## 2022-11-20 LAB — URIC ACID: Uric Acid, Serum: 7 mg/dL (ref 3.7–8.6)

## 2022-11-20 MED ORDER — SODIUM CHLORIDE 0.9 % IV SOLN
80.0000 mg/m2 | Freq: Once | INTRAVENOUS | Status: AC
  Administered 2022-11-20: 165 mg via INTRAVENOUS
  Filled 2022-11-20: qty 6.6

## 2022-11-20 MED ORDER — SODIUM CHLORIDE 0.9 % IV SOLN: INTRAVENOUS | Status: DC

## 2022-11-20 MED ORDER — DIPHENHYDRAMINE HCL 25 MG PO CAPS
50.0000 mg | ORAL_CAPSULE | Freq: Once | ORAL | Status: AC
  Administered 2022-11-20: 50 mg via ORAL
  Filled 2022-11-20: qty 2

## 2022-11-20 MED ORDER — ACETAMINOPHEN 325 MG PO TABS
650.0000 mg | ORAL_TABLET | Freq: Once | ORAL | Status: AC
  Administered 2022-11-20: 650 mg via ORAL
  Filled 2022-11-20: qty 2

## 2022-11-20 MED ORDER — ONDANSETRON HCL 8 MG PO TABS: 8.0000 mg | ORAL_TABLET | Freq: Three times a day (TID) | ORAL | 1 refills | Status: DC | PRN

## 2022-11-20 MED ORDER — SODIUM CHLORIDE 0.9 % IV SOLN
375.0000 mg/m2 | Freq: Once | INTRAVENOUS | Status: AC
  Administered 2022-11-20: 800 mg via INTRAVENOUS
  Filled 2022-11-20: qty 50
  Filled 2022-11-20: qty 80

## 2022-11-20 MED ORDER — PALONOSETRON HCL INJECTION 0.25 MG/5ML
0.2500 mg | Freq: Once | INTRAVENOUS | Status: AC
  Administered 2022-11-20: 0.25 mg via INTRAVENOUS
  Filled 2022-11-20: qty 5

## 2022-11-20 MED ORDER — PROCHLORPERAZINE MALEATE 10 MG PO TABS: 10.0000 mg | ORAL_TABLET | Freq: Four times a day (QID) | ORAL | 1 refills | Status: DC | PRN

## 2022-11-20 MED ORDER — DEXAMETHASONE SODIUM PHOSPHATE 10 MG/ML IJ SOLN
10.0000 mg | Freq: Once | INTRAMUSCULAR | Status: AC
  Administered 2022-11-20: 10 mg via INTRAVENOUS
  Filled 2022-11-20: qty 1

## 2022-11-20 MED ORDER — DEXAMETHASONE 4 MG PO TABS: 8.0000 mg | ORAL_TABLET | Freq: Two times a day (BID) | ORAL | 1 refills | Status: DC

## 2022-11-20 NOTE — Progress Notes (Signed)
Patient in chemotherapy education class with wife Boneta Lucks.  Discussed side effects of Rituxan, Bendamustine which include but are not limited to myelosuppression, decreased appetite, fatigue, fever, allergic or infusional reaction, mucositis, cardiac toxicity, cough, SOB, altered taste, nausea and vomiting, diarrhea, constipation, elevated LFTs myalgia and arthralgias, hair loss or thinning, rash, skin dryness, nail changes, peripheral neuropathy, discolored urine, delayed wound healing, mental changes (Chemo brain), increased risk of infections, weight loss.  Reviewed infusion room and office policy and procedure and phone numbers 24 hours x 7 days a week.  Reviewed when to call the office with any concerns or problems.  Transport planner given.   Antiemetic protocol and chemotherapy schedule reviewed. Patient verbalized understanding of chemotherapy indications and possible side effects.  Teachback done

## 2022-11-20 NOTE — Progress Notes (Signed)
Ok to treat with platelets 88 and CMET results from 11/13/2022. Per Dr. Myna Hidalgo.

## 2022-11-20 NOTE — Patient Instructions (Signed)
[facility] - A DEPT OF [facility]  Discharge Instructions: Thank you for choosing [facility] to provide your oncology and hematology care.   If you have a lab appointment with the Cancer Center, please go directly to the Cancer Center and check in at the registration area.  Wear comfortable clothing and clothing appropriate for easy access to any Portacath or PICC line.   We strive to give you quality time with your provider. You may need to reschedule your appointment if you arrive late (15 or more minutes).  Arriving late affects you and other patients whose appointments are after yours.  Also, if you miss three or more appointments without notifying the office, you may be dismissed from the clinic at the provider's discretion.      For prescription refill requests, have your pharmacy contact our office and allow 72 hours for refills to be completed.    Today you received the following chemotherapy and/or immunotherapy agents Rituxan/Bendeka       To help prevent nausea and vomiting after your treatment, we encourage you to take your nausea medication as directed.  BELOW ARE SYMPTOMS THAT SHOULD BE REPORTED IMMEDIATELY: *FEVER GREATER THAN 100.4 F (38 C) OR HIGHER *CHILLS OR SWEATING *NAUSEA AND VOMITING THAT IS NOT CONTROLLED WITH YOUR NAUSEA MEDICATION *UNUSUAL SHORTNESS OF BREATH *UNUSUAL BRUISING OR BLEEDING *URINARY PROBLEMS (pain or burning when urinating, or frequent urination) *BOWEL PROBLEMS (unusual diarrhea, constipation, pain near the anus) TENDERNESS IN MOUTH AND THROAT WITH OR WITHOUT PRESENCE OF ULCERS (sore throat, sores in mouth, or a toothache) UNUSUAL RASH, SWELLING OR PAIN  UNUSUAL VAGINAL DISCHARGE OR ITCHING   Items with * indicate a potential emergency and should be followed up as soon as possible or go to the Emergency Department if any problems should occur.  Please show the CHEMOTHERAPY ALERT CARD or  IMMUNOTHERAPY ALERT CARD at check-in to the Emergency Department and triage nurse. Should you have questions after your visit or need to cancel or reschedule your appointment, please contact [facility] - A DEPT OF [facility]  [phone] and follow the prompts.  Office hours are 8:00 a.m. to 4:30 p.m. Monday - Friday. Please note that voicemails left after 4:00 p.m. may not be returned until the following business day.  We are closed weekends and major holidays. You have access to a nurse at all times for urgent questions. Please call the main number to the clinic [phone] and follow the prompts.  For any non-urgent questions, you may also contact your provider using MyChart. We now offer e-Visits for anyone 23 and older to request care online for non-urgent symptoms. For details visit [email or web address].   Also download the MyChart app! Go to the app store, search "MyChart", open the app, select [facility], and log in with your MyChart username and password.

## 2022-11-21 ENCOUNTER — Inpatient Hospital Stay: Payer: Medicare Other

## 2022-11-21 VITALS — BP 111/55 | HR 67 | Temp 97.3°F | Resp 19

## 2022-11-21 DIAGNOSIS — Z5112 Encounter for antineoplastic immunotherapy: Secondary | ICD-10-CM | POA: Diagnosis not present

## 2022-11-21 DIAGNOSIS — Z5111 Encounter for antineoplastic chemotherapy: Secondary | ICD-10-CM | POA: Diagnosis not present

## 2022-11-21 DIAGNOSIS — R531 Weakness: Secondary | ICD-10-CM | POA: Diagnosis not present

## 2022-11-21 DIAGNOSIS — C9110 Chronic lymphocytic leukemia of B-cell type not having achieved remission: Secondary | ICD-10-CM

## 2022-11-21 DIAGNOSIS — Z7952 Long term (current) use of systemic steroids: Secondary | ICD-10-CM | POA: Diagnosis not present

## 2022-11-21 DIAGNOSIS — D591 Autoimmune hemolytic anemia, unspecified: Secondary | ICD-10-CM | POA: Diagnosis not present

## 2022-11-21 MED ORDER — SODIUM CHLORIDE 0.9 % IV SOLN
80.0000 mg/m2 | Freq: Once | INTRAVENOUS | Status: AC
  Administered 2022-11-21: 165 mg via INTRAVENOUS
  Filled 2022-11-21: qty 6.6

## 2022-11-21 MED ORDER — SODIUM CHLORIDE 0.9 % IV SOLN: INTRAVENOUS | Status: DC

## 2022-11-21 MED ORDER — DEXAMETHASONE SODIUM PHOSPHATE 10 MG/ML IJ SOLN
10.0000 mg | Freq: Once | INTRAMUSCULAR | Status: AC
  Administered 2022-11-21: 10 mg via INTRAVENOUS
  Filled 2022-11-21: qty 1

## 2022-11-26 ENCOUNTER — Encounter: Payer: Self-pay | Admitting: Hematology & Oncology

## 2022-11-26 LAB — FISH,CLL PROGNOSTIC PANEL

## 2022-11-27 ENCOUNTER — Other Ambulatory Visit: Payer: Self-pay | Admitting: *Deleted

## 2022-11-27 DIAGNOSIS — C9110 Chronic lymphocytic leukemia of B-cell type not having achieved remission: Secondary | ICD-10-CM

## 2022-11-28 ENCOUNTER — Inpatient Hospital Stay: Payer: Medicare Other

## 2022-11-28 DIAGNOSIS — C9110 Chronic lymphocytic leukemia of B-cell type not having achieved remission: Secondary | ICD-10-CM

## 2022-11-28 DIAGNOSIS — Z5111 Encounter for antineoplastic chemotherapy: Secondary | ICD-10-CM | POA: Diagnosis not present

## 2022-11-28 DIAGNOSIS — Z7952 Long term (current) use of systemic steroids: Secondary | ICD-10-CM | POA: Diagnosis not present

## 2022-11-28 DIAGNOSIS — R531 Weakness: Secondary | ICD-10-CM | POA: Diagnosis not present

## 2022-11-28 DIAGNOSIS — Z5112 Encounter for antineoplastic immunotherapy: Secondary | ICD-10-CM | POA: Diagnosis not present

## 2022-11-28 DIAGNOSIS — D591 Autoimmune hemolytic anemia, unspecified: Secondary | ICD-10-CM | POA: Diagnosis not present

## 2022-11-28 LAB — CBC WITH DIFFERENTIAL (CANCER CENTER ONLY)
Abs Immature Granulocytes: 0.04 10*3/uL (ref 0.00–0.07)
Basophils Absolute: 0 10*3/uL (ref 0.0–0.1)
Basophils Relative: 0 %
Eosinophils Absolute: 0.1 10*3/uL (ref 0.0–0.5)
Eosinophils Relative: 2 %
HCT: 27.8 % — ABNORMAL LOW (ref 39.0–52.0)
Hemoglobin: 9.7 g/dL — ABNORMAL LOW (ref 13.0–17.0)
Immature Granulocytes: 1 %
Lymphocytes Relative: 28 %
Lymphs Abs: 1 10*3/uL (ref 0.7–4.0)
MCH: 36.6 pg — ABNORMAL HIGH (ref 26.0–34.0)
MCHC: 34.9 g/dL (ref 30.0–36.0)
MCV: 104.9 fL — ABNORMAL HIGH (ref 80.0–100.0)
Monocytes Absolute: 0.2 10*3/uL (ref 0.1–1.0)
Monocytes Relative: 5 %
Neutro Abs: 2.3 10*3/uL (ref 1.7–7.7)
Neutrophils Relative %: 64 %
Platelet Count: 115 10*3/uL — ABNORMAL LOW (ref 150–400)
RBC: 2.65 MIL/uL — ABNORMAL LOW (ref 4.22–5.81)
RDW: 19.2 % — ABNORMAL HIGH (ref 11.5–15.5)
WBC Count: 3.5 10*3/uL — ABNORMAL LOW (ref 4.0–10.5)
nRBC: 0 % (ref 0.0–0.2)

## 2022-11-28 LAB — COMPREHENSIVE METABOLIC PANEL
ALT: 16 U/L (ref 0–44)
AST: 18 U/L (ref 15–41)
Albumin: 3.8 g/dL (ref 3.5–5.0)
Alkaline Phosphatase: 54 U/L (ref 38–126)
Anion gap: 5 (ref 5–15)
BUN: 20 mg/dL (ref 8–23)
CO2: 32 mmol/L (ref 22–32)
Calcium: 9.1 mg/dL (ref 8.9–10.3)
Chloride: 104 mmol/L (ref 98–111)
Creatinine, Ser: 1.02 mg/dL (ref 0.61–1.24)
GFR, Estimated: >60 mL/min (ref >60)
Glucose, Bld: 135 mg/dL — ABNORMAL HIGH (ref 70–99)
Potassium: 4.2 mmol/L (ref 3.5–5.1)
Sodium: 141 mmol/L (ref 135–145)
Total Bilirubin: 1.5 mg/dL — ABNORMAL HIGH (ref <1.2)
Total Protein: 5.7 g/dL — ABNORMAL LOW (ref 6.5–8.1)

## 2022-11-28 LAB — HEPATITIS B SURFACE ANTIGEN: Hepatitis B Surface Ag: NONREACTIVE

## 2022-11-28 LAB — IRON AND IRON BINDING CAPACITY (CC-WL,HP ONLY)
Iron: 84 ug/dL (ref 45–182)
Saturation Ratios: 26 % (ref 17.9–39.5)
TIBC: 326 ug/dL (ref 250–450)
UIBC: 242 ug/dL (ref 117–376)

## 2022-11-28 LAB — HEPATITIS B CORE ANTIBODY, TOTAL: Hep B Core Total Ab: NONREACTIVE

## 2022-11-28 LAB — FERRITIN: Ferritin: 41 ng/mL (ref 24–336)

## 2022-12-02 ENCOUNTER — Encounter: Payer: Self-pay | Admitting: Hematology & Oncology

## 2022-12-02 ENCOUNTER — Ambulatory Visit (HOSPITAL_BASED_OUTPATIENT_CLINIC_OR_DEPARTMENT_OTHER)
Admission: RE | Admit: 2022-12-02 | Discharge: 2022-12-02 | Disposition: A | Discharge status: Home / Self Care | Payer: Medicare Other | Source: Ambulatory Visit | Attending: Hematology & Oncology | Admitting: Hematology & Oncology

## 2022-12-02 ENCOUNTER — Inpatient Hospital Stay (HOSPITAL_BASED_OUTPATIENT_CLINIC_OR_DEPARTMENT_OTHER): Payer: Medicare Other | Admitting: Hematology & Oncology

## 2022-12-02 ENCOUNTER — Other Ambulatory Visit: Payer: Self-pay

## 2022-12-02 ENCOUNTER — Inpatient Hospital Stay: Payer: Medicare Other

## 2022-12-02 VITALS — BP 118/77 | HR 77 | Temp 97.6°F | Resp 19 | Ht 70.0 in | Wt 184.0 lb

## 2022-12-02 DIAGNOSIS — R161 Splenomegaly, not elsewhere classified: Secondary | ICD-10-CM | POA: Diagnosis not present

## 2022-12-02 DIAGNOSIS — C9110 Chronic lymphocytic leukemia of B-cell type not having achieved remission: Secondary | ICD-10-CM

## 2022-12-02 DIAGNOSIS — Q2547 Right aortic arch: Secondary | ICD-10-CM | POA: Diagnosis not present

## 2022-12-02 DIAGNOSIS — K7689 Other specified diseases of liver: Secondary | ICD-10-CM | POA: Diagnosis not present

## 2022-12-02 DIAGNOSIS — R0600 Dyspnea, unspecified: Secondary | ICD-10-CM | POA: Insufficient documentation

## 2022-12-02 DIAGNOSIS — I7122 Aneurysm of the aortic arch, without rupture: Secondary | ICD-10-CM | POA: Diagnosis not present

## 2022-12-02 LAB — CMP (CANCER CENTER ONLY)
ALT: 12 U/L (ref 0–44)
AST: 21 U/L (ref 15–41)
Albumin: 3.9 g/dL (ref 3.5–5.0)
Alkaline Phosphatase: 73 U/L (ref 38–126)
Anion gap: 5 (ref 5–15)
BUN: 22 mg/dL (ref 8–23)
CO2: 31 mmol/L (ref 22–32)
Calcium: 9.3 mg/dL (ref 8.9–10.3)
Chloride: 101 mmol/L (ref 98–111)
Creatinine: 1.06 mg/dL (ref 0.61–1.24)
GFR, Estimated: >60 mL/min (ref >60)
Glucose, Bld: 148 mg/dL — ABNORMAL HIGH (ref 70–99)
Potassium: 4.4 mmol/L (ref 3.5–5.1)
Sodium: 137 mmol/L (ref 135–145)
Total Bilirubin: 1.4 mg/dL — ABNORMAL HIGH (ref <1.2)
Total Protein: 5.8 g/dL — ABNORMAL LOW (ref 6.5–8.1)

## 2022-12-02 LAB — CBC WITH DIFFERENTIAL (CANCER CENTER ONLY)
Abs Immature Granulocytes: 0.03 10*3/uL (ref 0.00–0.07)
Basophils Absolute: 0 10*3/uL (ref 0.0–0.1)
Basophils Relative: 1 %
Eosinophils Absolute: 0.1 10*3/uL (ref 0.0–0.5)
Eosinophils Relative: 2 %
HCT: 28.7 % — ABNORMAL LOW (ref 39.0–52.0)
Hemoglobin: 9.7 g/dL — ABNORMAL LOW (ref 13.0–17.0)
Immature Granulocytes: 1 %
Lymphocytes Relative: 30 %
Lymphs Abs: 1 10*3/uL (ref 0.7–4.0)
MCH: 36.1 pg — ABNORMAL HIGH (ref 26.0–34.0)
MCHC: 33.8 g/dL (ref 30.0–36.0)
MCV: 106.7 fL — ABNORMAL HIGH (ref 80.0–100.0)
Monocytes Absolute: 0.3 10*3/uL (ref 0.1–1.0)
Monocytes Relative: 9 %
Neutro Abs: 2 10*3/uL (ref 1.7–7.7)
Neutrophils Relative %: 57 %
Platelet Count: 104 10*3/uL — ABNORMAL LOW (ref 150–400)
RBC: 2.69 MIL/uL — ABNORMAL LOW (ref 4.22–5.81)
RDW: 19 % — ABNORMAL HIGH (ref 11.5–15.5)
WBC Count: 3.5 10*3/uL — ABNORMAL LOW (ref 4.0–10.5)
nRBC: 0 % (ref 0.0–0.2)

## 2022-12-02 LAB — SAMPLE TO BLOOD BANK

## 2022-12-02 MED ORDER — IOHEXOL 350 MG/ML SOLN
75.0000 mL | Freq: Once | INTRAVENOUS | Status: AC | PRN
  Administered 2022-12-02: 75 mL via INTRAVENOUS

## 2022-12-02 NOTE — Progress Notes (Signed)
Hematology and Oncology Follow Up Visit  Jason Abbott [id number] 02-Dec-1945 77 y.o. 12/02/2022   Principle Diagnosis:  Stage C CLL -autoimmune hemolytic anemia  Current Therapy:   Prednisone 80 mg p.o. daily-started on 08/20/2022 --decrease to 40 mg p.o. daily on 09/04/2022 Prednisone 10 mg p.o. daily-changes on 10/02/2022 -DC on 11/13/2022 Rituxan/Bendamustine-start treatment on 11/20/2022   Interim History:  Jason Abbott is here today for an early visit.  He just is not doing all that well.  He gets very fatigued.  He gets short of breath.  He has had no chest wall pain.  He has had no fever.  He has had no cough.  We did lab work on him.  Everything looks okay.  He is little bit anemic.  However, I do not think this is low enough that should need for him to be transfused.  He has had no problems with bowels or bladder.  There is no diarrhea.  He has had no mouth sores.  He has had a decent appetite.  His O2 saturation was 87% when he came in today.  As such, we are going to have to get a CT angiogram of his chest to make sure that there is no pulmonary embolism.  He has chemotherapy 10 days ago.  He did well with this.  At the present time, I would say his performance status about ECOG 1.    Medications:  Allergies as of 12/02/2022       Reactions   Levaquin [levofloxacin] Other (See Comments)   Due to medical hx        Medication List        Accurate as of December 02, 2022  3:10 PM. If you have any questions, ask your nurse or doctor.          aspirin EC 81 MG tablet Take 1 tablet (81 mg total) by mouth daily at 6 (six) AM. Swallow whole.   AZO CRANBERRY PO Take 1 each by mouth in the morning.   cephALEXin 250 MG capsule Commonly known as: KEFLEX Take 1 capsule (250 mg total) by mouth daily.   dexamethasone 4 MG tablet Commonly known as: DECADRON Take 2 tablets (8 mg total) by mouth 2 (two) times daily with a meal. Take 2 tablets (8 mg total) by  mouth twice daily. Start the day after Bendamustine chemotherapy for 2 days. Take with food   doxylamine (Sleep) 25 MG tablet Commonly known as: UNISOM Take 25 mg by mouth at bedtime as needed.   famciclovir 250 MG tablet Commonly known as: FAMVIR TAKE 1 TABLET BY MOUTH TWICE A DAY   finasteride 5 MG tablet Commonly known as: PROSCAR Take 1 tablet (5 mg total) by mouth daily.   fluconazole 100 MG tablet Commonly known as: DIFLUCAN Take 1 tablet (100 mg total) by mouth daily.   folic acid 1 MG tablet Commonly known as: FOLVITE Take 2 tablets (2 mg total) by mouth daily.   MULTIVITAMIN GUMMIES ADULTS PO Take 2 tablets by mouth in the morning.   ondansetron 8 MG tablet Commonly known as: ZOFRAN Take 1 tablet (8 mg total) by mouth every 8 (eight) hours as needed for nausea or vomiting. Start on the 3rd day after chemotherapy.   predniSONE 5 MG tablet Commonly known as: DELTASONE TAKE 1 TABLET BY MOUTH EVERY DAY WITH BREAKFAST   prochlorperazine 10 MG tablet Commonly known as: COMPAZINE Take 1 tablet (10 mg total) by mouth every 6 (six)  hours as needed for nausea or vomiting.   tamsulosin 0.4 MG Caps capsule Commonly known as: FLOMAX Take 1 capsule (0.4 mg total) by mouth in the morning and at bedtime.   VITAMIN B 12 PO Take 1 tablet by mouth in the morning.        Allergies:  Allergies  Allergen Reactions   Levaquin [Levofloxacin] Other (See Comments)    Due to medical hx    Past Medical History, Surgical history, Social history, and Family History were reviewed and updated.  Review of Systems: Review of Systems  Constitutional: Negative.   HENT: Negative.    Eyes: Negative.   Respiratory: Negative.    Cardiovascular: Negative.   Gastrointestinal: Negative.   Genitourinary: Negative.   Musculoskeletal: Negative.   Skin: Negative.   Neurological: Negative.   Endo/Heme/Allergies: Negative.   Psychiatric/Behavioral: Negative.       Physical Exam:   height is 5\' 10"  (1.778 m) and weight is 184 lb (83.5 kg). His oral temperature is 97.6 F (36.4 C). His blood pressure is 118/77 and his pulse is 77. His respiration is 19 and oxygen saturation is 99%.   Wt Readings from Last 3 Encounters:  12/02/22 184 lb (83.5 kg)  11/13/22 192 lb (87.1 kg)  11/05/22 189 lb (85.7 kg)    Physical Exam Vitals reviewed.  HENT:     Head: Normocephalic and atraumatic.  Eyes:     Pupils: Pupils are equal, round, and reactive to light.  Cardiovascular:     Rate and Rhythm: Normal rate and regular rhythm.     Heart sounds: Normal heart sounds.  Pulmonary:     Effort: Pulmonary effort is normal.     Breath sounds: Normal breath sounds.  Abdominal:     General: Bowel sounds are normal.     Palpations: Abdomen is soft.  Musculoskeletal:        General: No tenderness or deformity. Normal range of motion.     Cervical back: Normal range of motion.  Lymphadenopathy:     Cervical: No cervical adenopathy.  Skin:    General: Skin is warm and dry.     Findings: No erythema or rash.  Neurological:     Mental Status: He is alert and oriented to person, place, and time.  Psychiatric:        Behavior: Behavior normal.        Thought Content: Thought content normal.        Judgment: Judgment normal.      Lab Results  Component Value Date   WBC 3.5 (L) 12/02/2022   HGB 9.7 (L) 12/02/2022   HCT 28.7 (L) 12/02/2022   MCV 106.7 (H) 12/02/2022   PLT 104 (L) 12/02/2022   Lab Results  Component Value Date   FERRITIN 41 11/28/2022   IRON 84 11/28/2022   TIBC 326 11/28/2022   UIBC 242 11/28/2022   IRONPCTSAT 26 11/28/2022   Lab Results  Component Value Date   RETICCTPCT 6.2 (H) 11/13/2022   RBC 2.69 (L) 12/02/2022   Lab Results  Component Value Date   KPAFRELGTCHN 12.8 08/26/2021   LAMBDASER 24.9 08/26/2021   KAPLAMBRATIO 0.51 08/26/2021   Lab Results  Component Value Date   IGGSERUM 808 08/26/2021   IGA 149 08/26/2021   IGMSERUM 39  08/26/2021   No results found for: "TOTALPROTELP", "ALBUMINELP", "A1GS", "A2GS", "BETS", "BETA2SER", "GAMS", "MSPIKE", "SPEI"   Chemistry      Component Value Date/Time   NA 137 12/02/2022 1416  NA 142 11/05/2022 1127   K 4.4 12/02/2022 1416   CL 101 12/02/2022 1416   CO2 31 12/02/2022 1416   BUN 22 12/02/2022 1416   BUN 18 11/05/2022 1127   CREATININE 1.06 12/02/2022 1416   CREATININE 1.03 08/15/2021 0000      Component Value Date/Time   CALCIUM 9.3 12/02/2022 1416   ALKPHOS 73 12/02/2022 1416   AST 21 12/02/2022 1416   ALT 12 12/02/2022 1416   BILITOT 1.4 (H) 12/02/2022 1416       Impression and Plan: Jason Abbott is a very pleasant 77 yo caucasian gentleman with stage C CLL.  He developed autoimmune hemolytic anemia.  We started him on prednisone.  He had a very nice response.  He has had 1 cycle of chemotherapy with Rituxan/Bendamustine.  While still not sure as to what is going on with him.  I do think we are going to have to get a CT angiogram of his chest to make sure that there is no pulmonary embolism.  His labs do not look all that bad.  I can find nothing on his physical exam that is concerning.  Again, we will have to see what the CT angiogram has to show.  I would not have any great ideas.  We will see if the CT angiogram shows anything else with his lungs.  Again on his exam his lungs sound good.  I do not hear any wheezing or crackles.  I do not think he has pneumonia.  His legs looked okay on exam.  I cannot see any swelling.  I cannot feel any obvious venous cord in the lower legs.  This is a little bit of a mystery right now.     Josph Macho, MD 11/26/20243:10 PM

## 2022-12-03 ENCOUNTER — Encounter: Payer: Self-pay | Admitting: *Deleted

## 2022-12-09 ENCOUNTER — Encounter: Payer: Self-pay | Admitting: Urology

## 2022-12-09 ENCOUNTER — Ambulatory Visit (INDEPENDENT_AMBULATORY_CARE_PROVIDER_SITE_OTHER): Payer: Medicare Other | Admitting: Urology

## 2022-12-09 VITALS — BP 123/67 | HR 75 | Wt 183.0 lb

## 2022-12-09 DIAGNOSIS — R339 Retention of urine, unspecified: Secondary | ICD-10-CM

## 2022-12-09 DIAGNOSIS — N390 Urinary tract infection, site not specified: Secondary | ICD-10-CM | POA: Diagnosis not present

## 2022-12-09 DIAGNOSIS — Z87440 Personal history of urinary (tract) infections: Secondary | ICD-10-CM | POA: Diagnosis not present

## 2022-12-09 DIAGNOSIS — N401 Enlarged prostate with lower urinary tract symptoms: Secondary | ICD-10-CM | POA: Diagnosis not present

## 2022-12-09 DIAGNOSIS — R3914 Feeling of incomplete bladder emptying: Secondary | ICD-10-CM | POA: Diagnosis not present

## 2022-12-09 LAB — URINALYSIS, ROUTINE W REFLEX MICROSCOPIC
Bilirubin, UA: NEGATIVE
Glucose, UA: NEGATIVE
Ketones, UA: NEGATIVE
Leukocytes,UA: NEGATIVE
Nitrite, UA: NEGATIVE
Protein,UA: NEGATIVE
RBC, UA: NEGATIVE
Specific Gravity, UA: 1.025 (ref 1.005–1.030)
Urobilinogen, Ur: 0.2 mg/dL (ref 0.2–1.0)
pH, UA: 5.5 (ref 5.0–7.5)

## 2022-12-09 LAB — BLADDER SCAN AMB NON-IMAGING

## 2022-12-09 MED ORDER — CEPHALEXIN 250 MG PO CAPS: 250.0000 mg | ORAL_CAPSULE | Freq: Every day | ORAL | 5 refills | Status: DC

## 2022-12-09 NOTE — Progress Notes (Signed)
Assessment: 1. Recurrent UTI   2. Benign prostatic hyperplasia with incomplete bladder emptying   3. Incomplete bladder emptying     Plan: Continue combination medical therapy with tamsulosin and finasteride.  Would also probably recommend continuing low-dose Keflex suppression while he is on chemotherapy. Follow-up 4 months for recheck  Chief Complaint: LUTS/recurrent UTI   HPI: Jason Abbott. is a 77 y.o. male who presents for continued evaluation of BPH/lower urinary tract symptoms complicated by recurrent UTI.Jason Abbott Patient underwent cystoscopy 08/2022 which was unremarkable except for BPH.  RUS negative -2022 Prior PVRs in the 2-300 range. Patient has been started on combination medical therapy with tamsulosin and finasteride and also low-dose Keflex suppression.  At present he reports that he is doing remarkably well.  Current IPSS = 5. PVR today = 202 mL UA entirely clear  Patient is currently on chemotherapy for worsening CLL   Portions of the above documentation were copied from a prior visit for review purposes only.  Allergies: Allergies  Allergen Reactions   Levaquin [Levofloxacin] Other (See Comments)    Due to medical hx    PMH: Past Medical History:  Diagnosis Date   Aberrant right subclavian artery    Aortic arch aneurysm (HCC)    Arthritis    Bowel obstruction (HCC)    CLL (chronic lymphocytic leukemia) (HCC)    COVID 2021   hospitalized for it   Diverticul disease small and large intestine, no perforati or abscess    GERD (gastroesophageal reflux disease)    uses tums as needed   H/O urinary infection    Pneumonia 09/22/2021   Shingles outbreak 10/03/2013   Stroke (HCC) 01/2019    PSH: Past Surgical History:  Procedure Laterality Date   AORTIC ARCH DEBRANCHING N/A 01/10/2022   Procedure: AORTIC ARCH DEBRANCHING WITH 16 X 8 X 10 MM HEMASHIELD GOLD GRAFT;  Surgeon: Alleen Borne, MD;  Location: [facility] OR;  Service: Open Heart Surgery;   Laterality: N/A;   CAROTID-SUBCLAVIAN BYPASS GRAFT Right 12/18/2021   Procedure: RIGHT BYPASS GRAFT CAROTID-SUBCLAVIAN;  Surgeon: Nada Libman, MD;  Location: [facility] OR;  Service: Vascular;  Laterality: Right;   CARPAL TUNNEL RELEASE Right 11/25/2021   Procedure: CARPAL TUNNEL RELEASE;  Surgeon: Marlyne Beards, MD;  Location: [facility] OR;  Service: Orthopedics;  Laterality: Right;  or MAC with regional block 60   HERNIA REPAIR     MECKEL DIVERTICULUM EXCISION     ORIF FINGER / THUMB FRACTURE     TEE WITHOUT CARDIOVERSION N/A 01/10/2022   Procedure: TRANSESOPHAGEAL ECHOCARDIOGRAM (TEE);  Surgeon: Alleen Borne, MD;  Location: [facility] OR;  Service: Open Heart Surgery;  Laterality: N/A;   TENOSYNOVECTOMY Right 11/25/2021   Procedure: FLEXOR TENOSYNOVECTOMY;  Surgeon: Marlyne Beards, MD;  Location: [facility] OR;  Service: Orthopedics;  Laterality: Right;  or MAC with regional block 60   THORACIC AORTIC ENDOVASCULAR STENT GRAFT N/A 01/10/2022   Procedure: THORACIC AORTIC ENDOVASCULAR STENT GRAFT;  Surgeon: Nada Libman, MD;  Location: [facility] OR;  Service: Vascular;  Laterality: N/A;   ulna nerve surgery      SH: Social History   Tobacco Use   Smoking status: Former    Current packs/day: 0.00    Average packs/day: 0.5 packs/day for 23.0 years (11.5 ttl pk-yrs)    Types: Cigarettes    Start date: 65    Quit date: 1989    Years since quitting: 35.9   Smokeless tobacco: Never  Vaping Use   Vaping  status: Never Used  Substance Use Topics   Alcohol use: Not Currently    Comment: rarely   Drug use: No    ROS: Constitutional:  Negative for fever, chills, weight loss CV: Negative for chest pain, previous MI, hypertension Respiratory:  Negative for shortness of breath, wheezing, sleep apnea, frequent cough GI:  Negative for nausea, vomiting, bloody stool, GERD  PE: Vitals:   12/09/22 1434  BP: 123/67  Pulse: 75    GENERAL APPEARANCE:  Well appearing, well developed, well nourished,  NAD    Results: UA is entirely clear

## 2022-12-10 ENCOUNTER — Other Ambulatory Visit: Payer: Self-pay | Admitting: Hematology & Oncology

## 2022-12-10 DIAGNOSIS — C9110 Chronic lymphocytic leukemia of B-cell type not having achieved remission: Secondary | ICD-10-CM

## 2022-12-11 ENCOUNTER — Ambulatory Visit: Payer: Medicare Other | Admitting: Urology

## 2022-12-18 ENCOUNTER — Inpatient Hospital Stay (HOSPITAL_BASED_OUTPATIENT_CLINIC_OR_DEPARTMENT_OTHER): Payer: Medicare Other | Admitting: Hematology & Oncology

## 2022-12-18 ENCOUNTER — Inpatient Hospital Stay: Payer: Medicare Other | Attending: Hematology & Oncology

## 2022-12-18 ENCOUNTER — Inpatient Hospital Stay: Payer: Medicare Other

## 2022-12-18 ENCOUNTER — Encounter: Payer: Self-pay | Admitting: Hematology & Oncology

## 2022-12-18 VITALS — BP 118/71 | HR 79 | Temp 97.6°F | Resp 20 | Ht 70.0 in | Wt 188.4 lb

## 2022-12-18 DIAGNOSIS — Z79899 Other long term (current) drug therapy: Secondary | ICD-10-CM | POA: Insufficient documentation

## 2022-12-18 DIAGNOSIS — C9110 Chronic lymphocytic leukemia of B-cell type not having achieved remission: Secondary | ICD-10-CM

## 2022-12-18 DIAGNOSIS — Z7952 Long term (current) use of systemic steroids: Secondary | ICD-10-CM | POA: Diagnosis not present

## 2022-12-18 DIAGNOSIS — D591 Autoimmune hemolytic anemia, unspecified: Secondary | ICD-10-CM | POA: Insufficient documentation

## 2022-12-18 DIAGNOSIS — R55 Syncope and collapse: Secondary | ICD-10-CM

## 2022-12-18 LAB — CBC WITH DIFFERENTIAL (CANCER CENTER ONLY)
Abs Immature Granulocytes: 0.01 10*3/uL (ref 0.00–0.07)
Basophils Absolute: 0 10*3/uL (ref 0.0–0.1)
Basophils Relative: 1 %
Eosinophils Absolute: 0.1 10*3/uL (ref 0.0–0.5)
Eosinophils Relative: 3 %
HCT: 31.1 % — ABNORMAL LOW (ref 39.0–52.0)
Hemoglobin: 10.6 g/dL — ABNORMAL LOW (ref 13.0–17.0)
Immature Granulocytes: 1 %
Lymphocytes Relative: 37 %
Lymphs Abs: 0.8 10*3/uL (ref 0.7–4.0)
MCH: 36.2 pg — ABNORMAL HIGH (ref 26.0–34.0)
MCHC: 34.1 g/dL (ref 30.0–36.0)
MCV: 106.1 fL — ABNORMAL HIGH (ref 80.0–100.0)
Monocytes Absolute: 0.4 10*3/uL (ref 0.1–1.0)
Monocytes Relative: 18 %
Neutro Abs: 0.9 10*3/uL — ABNORMAL LOW (ref 1.7–7.7)
Neutrophils Relative %: 40 %
Platelet Count: 101 10*3/uL — ABNORMAL LOW (ref 150–400)
RBC: 2.93 MIL/uL — ABNORMAL LOW (ref 4.22–5.81)
RDW: 16.3 % — ABNORMAL HIGH (ref 11.5–15.5)
WBC Count: 2.2 10*3/uL — ABNORMAL LOW (ref 4.0–10.5)
nRBC: 0 % (ref 0.0–0.2)

## 2022-12-18 LAB — CMP (CANCER CENTER ONLY)
ALT: 8 U/L (ref 0–44)
AST: 16 U/L (ref 15–41)
Albumin: 4.1 g/dL (ref 3.5–5.0)
Alkaline Phosphatase: 61 U/L (ref 38–126)
Anion gap: 8 (ref 5–15)
BUN: 21 mg/dL (ref 8–23)
CO2: 29 mmol/L (ref 22–32)
Calcium: 9.3 mg/dL (ref 8.9–10.3)
Chloride: 105 mmol/L (ref 98–111)
Creatinine: 1.04 mg/dL (ref 0.61–1.24)
GFR, Estimated: >60 mL/min (ref >60)
Glucose, Bld: 92 mg/dL (ref 70–99)
Potassium: 4 mmol/L (ref 3.5–5.1)
Sodium: 142 mmol/L (ref 135–145)
Total Bilirubin: 0.8 mg/dL (ref <1.2)
Total Protein: 6.1 g/dL — ABNORMAL LOW (ref 6.5–8.1)

## 2022-12-18 LAB — RETICULOCYTES
Immature Retic Fract: 18.9 % — ABNORMAL HIGH (ref 2.3–15.9)
RBC.: 2.89 MIL/uL — ABNORMAL LOW (ref 4.22–5.81)
Retic Count, Absolute: 113.6 10*3/uL (ref 19.0–186.0)
Retic Ct Pct: 3.9 % — ABNORMAL HIGH (ref 0.4–3.1)

## 2022-12-18 LAB — DIRECT ANTIGLOBULIN TEST (NOT AT ARMC)
DAT, IgG: POSITIVE
DAT, complement: POSITIVE

## 2022-12-18 LAB — LACTATE DEHYDROGENASE: LDH: 293 U/L — ABNORMAL HIGH (ref 98–192)

## 2022-12-18 LAB — SAVE SMEAR(SSMR), FOR PROVIDER SLIDE REVIEW

## 2022-12-18 MED ORDER — PREDNISONE 10 MG PO TABS: 20.0000 mg | ORAL_TABLET | Freq: Every day | ORAL | 2 refills | Status: DC

## 2022-12-18 MED ORDER — PANTOPRAZOLE SODIUM 40 MG PO TBEC: 40.0000 mg | DELAYED_RELEASE_TABLET | Freq: Every day | ORAL | 4 refills | Status: DC

## 2022-12-18 NOTE — Progress Notes (Signed)
Hematology and Oncology Follow Up Visit  Jason Abbott [id number] [birth year] 77 y.o. 12/18/2022   Principle Diagnosis:  Stage C CLL -autoimmune hemolytic anemia  Current Therapy:   Prednisone 80 mg p.o. daily-started on 08/20/2022 --decrease to 40 mg p.o. daily on 09/04/2022 Prednisone 10 mg p.o. daily-changes on 10/02/2022 -DC on 11/13/2022 Rituxan/Bendamustine-start treatment on 11/20/2022 -status post cycle 1-DC on 12/18/2022 due to poor tolerance   Interim History:  Jason Abbott is here today for follow-up.  Unfortunate, he still is not doing all that great.  I really cannot figure out why he is just so weak and fatigued.  He just has a decreased performance status right now.  When we last saw him, we did a CT angiogram think that he may have had a pulmonary embolism.  This was unremarkable.  There is no pneumonia there there is no obvious congestive heart failure.  I really think we are to have to hold his treatment for right now.  I probably just will discontinue his treatment.  He has responded well.  His hemoglobin is improving.   His last echocardiogram was a year ago.  He may not be a bad idea to repeat this.  I think 1 option for him might be to try to get him on prednisone.  Maybe, we can improve his status with prednisone.  Again, I just feel bad that he does not feel that good.  His performance status is not that great.  He has had no fever.  He has had no bleeding.  There is no change in bowel or bladder habits.  He has had no cough.  He has had no nausea or vomiting.  There has been no leg swelling.  Overall, I would say his performance status is probably ECOG 2.      Medications:  Allergies as of 12/18/2022       Reactions   Levaquin [levofloxacin] Other (See Comments)   Due to medical hx        Medication List        Accurate as of December 18, 2022 11:17 AM. If you have any questions, ask your nurse or doctor.          STOP taking these medications     fluconazole 100 MG tablet Commonly known as: DIFLUCAN Stopped by: Josph Macho   predniSONE 5 MG tablet Commonly known as: DELTASONE Stopped by: Josph Macho       TAKE these medications    aspirin EC 81 MG tablet Take 1 tablet (81 mg total) by mouth daily at 6 (six) AM. Swallow whole.   AZO CRANBERRY PO Take 1 each by mouth in the morning.   cephALEXin 250 MG capsule Commonly known as: KEFLEX Take 1 capsule (250 mg total) by mouth daily.   dexamethasone 4 MG tablet Commonly known as: DECADRON Take 2 tablets (8 mg total) by mouth 2 (two) times daily with a meal. Take 2 tablets (8 mg total) by mouth twice daily. Start the day after Bendamustine chemotherapy for 2 days. Take with food   doxylamine (Sleep) 25 MG tablet Commonly known as: UNISOM Take 25 mg by mouth at bedtime as needed.   famciclovir 250 MG tablet Commonly known as: FAMVIR TAKE 1 TABLET BY MOUTH TWICE A DAY   finasteride 5 MG tablet Commonly known as: PROSCAR Take 1 tablet (5 mg total) by mouth daily.   folic acid 1 MG tablet Commonly known as: FOLVITE Take 2 tablets (  2 mg total) by mouth daily.   MULTIVITAMIN GUMMIES ADULTS PO Take 2 tablets by mouth in the morning.   ondansetron 8 MG tablet Commonly known as: ZOFRAN Take 1 tablet (8 mg total) by mouth every 8 (eight) hours as needed for nausea or vomiting. Start on the 3rd day after chemotherapy.   prochlorperazine 10 MG tablet Commonly known as: COMPAZINE Take 1 tablet (10 mg total) by mouth every 6 (six) hours as needed for nausea or vomiting.   tamsulosin 0.4 MG Caps capsule Commonly known as: FLOMAX Take 1 capsule (0.4 mg total) by mouth in the morning and at bedtime.   VITAMIN B 12 PO Take 1 tablet by mouth in the morning.        Allergies:  Allergies  Allergen Reactions   Levaquin [Levofloxacin] Other (See Comments)    Due to medical hx    Past Medical History, Surgical history, Social history, and Family History  were reviewed and updated.  Review of Systems: Review of Systems  Constitutional: Negative.   HENT: Negative.    Eyes: Negative.   Respiratory: Negative.    Cardiovascular: Negative.   Gastrointestinal: Negative.   Genitourinary: Negative.   Musculoskeletal: Negative.   Skin: Negative.   Neurological: Negative.   Endo/Heme/Allergies: Negative.   Psychiatric/Behavioral: Negative.       Physical Exam:  height is 5\' 10"  (1.778 m) and weight is 188 lb 6.4 oz (85.5 kg). His oral temperature is 97.6 F (36.4 C). His blood pressure is 118/71 and his pulse is 79. His respiration is 20 and oxygen saturation is 100%.   Wt Readings from Last 3 Encounters:  12/18/22 188 lb 6.4 oz (85.5 kg)  12/09/22 183 lb (83 kg)  12/02/22 184 lb (83.5 kg)    Physical Exam Vitals reviewed.  HENT:     Head: Normocephalic and atraumatic.  Eyes:     Pupils: Pupils are equal, round, and reactive to light.  Cardiovascular:     Rate and Rhythm: Normal rate and regular rhythm.     Heart sounds: Normal heart sounds.  Pulmonary:     Effort: Pulmonary effort is normal.     Breath sounds: Normal breath sounds.  Abdominal:     General: Bowel sounds are normal.     Palpations: Abdomen is soft.  Musculoskeletal:        General: No tenderness or deformity. Normal range of motion.     Cervical back: Normal range of motion.  Lymphadenopathy:     Cervical: No cervical adenopathy.  Skin:    General: Skin is warm and dry.     Findings: No erythema or rash.  Neurological:     Mental Status: He is alert and oriented to person, place, and time.  Psychiatric:        Behavior: Behavior normal.        Thought Content: Thought content normal.        Judgment: Judgment normal.      Lab Results  Component Value Date   WBC 2.2 (L) 12/18/2022   HGB 10.6 (L) 12/18/2022   HCT 31.1 (L) 12/18/2022   MCV 106.1 (H) 12/18/2022   PLT 101 (L) 12/18/2022   Lab Results  Component Value Date   FERRITIN 41  11/28/2022   IRON 84 11/28/2022   TIBC 326 11/28/2022   UIBC 242 11/28/2022   IRONPCTSAT 26 11/28/2022   Lab Results  Component Value Date   RETICCTPCT 3.9 (H) 12/18/2022   RBC 2.89 (L)  12/18/2022   Lab Results  Component Value Date   KPAFRELGTCHN 12.8 08/26/2021   LAMBDASER 24.9 08/26/2021   KAPLAMBRATIO 0.51 08/26/2021   Lab Results  Component Value Date   IGGSERUM 808 08/26/2021   IGA 149 08/26/2021   IGMSERUM 39 08/26/2021   No results found for: "TOTALPROTELP", "ALBUMINELP", "A1GS", "A2GS", "BETS", "BETA2SER", "GAMS", "MSPIKE", "SPEI"   Chemistry      Component Value Date/Time   NA 142 12/18/2022 1007   NA 142 11/05/2022 1127   K 4.0 12/18/2022 1007   CL 105 12/18/2022 1007   CO2 29 12/18/2022 1007   BUN 21 12/18/2022 1007   BUN 18 11/05/2022 1127   CREATININE 1.04 12/18/2022 1007   CREATININE 1.03 08/15/2021 0000      Component Value Date/Time   CALCIUM 9.3 12/18/2022 1007   ALKPHOS 61 12/18/2022 1007   AST 16 12/18/2022 1007   ALT 8 12/18/2022 1007   BILITOT 0.8 12/18/2022 1007       Impression and Plan: Jason Abbott is a very pleasant 77 yo caucasian gentleman with stage C CLL.  He developed autoimmune hemolytic anemia.  We started him on prednisone.  He had a very nice response.  He has had 1 cycle of chemotherapy with Rituxan/Bendamustine.  Again, stopping his treatment.  I just do not think that he is going to tolerate this.  Again I am surprised by this.  I will repeat a echocardiogram on him.  This certainly may not be a bad idea.  I will give him some prednisone.  We will see if the prednisone might help a little bit.  Will have him come back in 3 weeks.  We will see how he is feeling.  Maybe, he will be feeling a little bit better.  Again he is responded well.  We do have the luxury of being able to hold treatment for right now.   Josph Macho, MD 12/12/202411:17 AM

## 2022-12-19 ENCOUNTER — Inpatient Hospital Stay: Payer: Medicare Other

## 2022-12-29 ENCOUNTER — Encounter: Payer: Self-pay | Admitting: Sports Medicine

## 2022-12-29 ENCOUNTER — Ambulatory Visit
Admission: RE | Admit: 2022-12-29 | Discharge: 2022-12-29 | Disposition: A | Discharge status: Home / Self Care | Payer: Medicare Other | Source: Ambulatory Visit | Attending: Family Medicine | Admitting: Family Medicine

## 2022-12-29 ENCOUNTER — Encounter: Payer: Self-pay | Admitting: Hematology & Oncology

## 2022-12-29 VITALS — BP 122/77 | HR 68 | Temp 98.6°F | Resp 18

## 2022-12-29 DIAGNOSIS — R052 Subacute cough: Secondary | ICD-10-CM | POA: Diagnosis not present

## 2022-12-29 DIAGNOSIS — J018 Other acute sinusitis: Secondary | ICD-10-CM

## 2022-12-29 MED ORDER — AMOXICILLIN-POT CLAVULANATE 875-125 MG PO TABS: 1.0000 | ORAL_TABLET | Freq: Two times a day (BID) | ORAL | 0 refills | Status: DC

## 2022-12-29 MED ORDER — BENZONATATE 200 MG PO CAPS
200.0000 mg | ORAL_CAPSULE | Freq: Three times a day (TID) | ORAL | 0 refills | Status: DC | PRN
Start: 2022-12-29 — End: 2023-01-08

## 2022-12-29 NOTE — ED Provider Notes (Signed)
[facility]    CSN: [id number] Arrival date & time: 12/29/22  1628      History   Chief Complaint Chief Complaint  Patient presents with   Cough    Entered by patient    HPI Jason Abbott. is a 77 y.o. male.   Patient here with nasal congestion, cough which is occasionally productive is exacerbated by laying flat.  Symptoms have continued to worsen over the course of 8 days.  Patient is here with his wife who reports that she is concerned that he has RSV because she was vaccinated against RSV and he is the only person that is sick.  Patient has not had fever and is currently afebrile.  He has not had any shortness of breath. No known exposures to RSV, COVID or influenza.  Past Medical History:  Diagnosis Date   Aberrant right subclavian artery    Aortic arch aneurysm (HCC)    Arthritis    Bowel obstruction (HCC)    CLL (chronic lymphocytic leukemia) (HCC)    COVID 2021   hospitalized for it   Diverticul disease small and large intestine, no perforati or abscess    GERD (gastroesophageal reflux disease)    uses tums as needed   H/O urinary infection    Pneumonia 09/22/2021   Shingles outbreak 10/03/2013   Stroke (HCC) 01/2019    Patient Active Problem List   Diagnosis Date Noted   Hallucination, prednisone induced 10/22/2022   Recurrent UTI 07/24/2022   Atrial fibrillation (HCC) 01/21/2022   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/17/2021   Hyponatremia 09/13/2021   Hypoalbuminemia 09/13/2021   BPH (benign prostatic hyperplasia) 09/13/2021   Thrombocytopenia (HCC) 09/09/2021   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   Ingrown right greater toenail 08/12/2021   Right hand pain 03/11/2021   Tinnitus 03/11/2021   Former smoker 12/26/2020   Renal cyst 12/26/2020   Elevated TSH 09/12/2020   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   CLL (chronic lymphocytic leukemia) (HCC) 02/23/2019   Aortic arch aneurysm  (HCC) 02/09/2019   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/27/2019   Lumbar spinal stenosis 12/21/2018   Numbness and tingling in both hands 10/20/2018   Hyperlipidemia, mixed 06/01/2018   Seborrheic keratosis 01/12/2018   Annual physical exam 04/26/2015   Dyspepsia 02/23/2014   Peptic ulcer disease 02/23/2014   Right shoulder pain 10/03/2013    Past Surgical History:  Procedure Laterality Date   AORTIC ARCH DEBRANCHING N/A 01/10/2022   Procedure: AORTIC ARCH DEBRANCHING WITH 16 X 8 X 10 MM HEMASHIELD GOLD GRAFT;  Surgeon: Alleen Borne, MD;  Location: [facility] OR;  Service: Open Heart Surgery;  Laterality: N/A;   CAROTID-SUBCLAVIAN BYPASS GRAFT Right 12/18/2021   Procedure: RIGHT BYPASS GRAFT CAROTID-SUBCLAVIAN;  Surgeon: Nada Libman, MD;  Location: [facility] OR;  Service: Vascular;  Laterality: Right;   CARPAL TUNNEL RELEASE Right 11/25/2021   Procedure: CARPAL TUNNEL RELEASE;  Surgeon: Marlyne Beards, MD;  Location: [facility] OR;  Service: Orthopedics;  Laterality: Right;  or MAC with regional block 60   HERNIA REPAIR     MECKEL DIVERTICULUM EXCISION     ORIF FINGER / THUMB FRACTURE     TEE WITHOUT CARDIOVERSION N/A 01/10/2022   Procedure: TRANSESOPHAGEAL ECHOCARDIOGRAM (TEE);  Surgeon: Alleen Borne, MD;  Location: [facility] OR;  Service: Open Heart Surgery;  Laterality: N/A;   TENOSYNOVECTOMY Right 11/25/2021   Procedure: FLEXOR TENOSYNOVECTOMY;  Surgeon: Marlyne Beards, MD;  Location:  [facility] OR;  Service: Orthopedics;  Laterality: Right;  or MAC with regional block 60   THORACIC AORTIC ENDOVASCULAR STENT GRAFT N/A 01/10/2022   Procedure: THORACIC AORTIC ENDOVASCULAR STENT GRAFT;  Surgeon: Nada Libman, MD;  Location: [facility] OR;  Service: Vascular;  Laterality: N/A;   ulna nerve surgery         Home Medications    Prior to Admission medications   Medication Sig Start Date End Date Taking? Authorizing Provider  amoxicillin-clavulanate (AUGMENTIN) 875-125 MG tablet Take 1 tablet by mouth 2  (two) times daily for 7 days. 12/29/22 01/05/23 Yes Bing Neighbors, NP  benzonatate (TESSALON) 200 MG capsule Take 1 capsule (200 mg total) by mouth 3 (three) times daily as needed for cough. 12/29/22  Yes Bing Neighbors, NP  aspirin EC 81 MG tablet Take 1 tablet (81 mg total) by mouth daily at 6 (six) AM. Swallow whole. 12/20/21   Schuh, McKenzi P, PA-C  cephALEXin (KEFLEX) 250 MG capsule Take 1 capsule (250 mg total) by mouth daily. 12/09/22   Joline Maxcy, MD  Cranberry-Vitamin C-Probiotic (AZO CRANBERRY PO) Take 1 each by mouth in the morning.    [provider]  Cyanocobalamin (VITAMIN B 12 PO) Take 1 tablet by mouth in the morning.    [provider]  doxylamine, Sleep, (UNISOM) 25 MG tablet Take 25 mg by mouth at bedtime as needed.    [provider]  famciclovir (FAMVIR) 250 MG tablet TAKE 1 TABLET BY MOUTH TWICE A DAY 09/11/22   Josph Macho, MD  finasteride (PROSCAR) 5 MG tablet Take 1 tablet (5 mg total) by mouth daily. 08/08/22   Joline Maxcy, MD  folic acid (FOLVITE) 1 MG tablet Take 2 tablets (2 mg total) by mouth daily. 09/04/22   Josph Macho, MD  Multiple Vitamins-Minerals (MULTIVITAMIN GUMMIES ADULTS PO) Take 2 tablets by mouth in the morning.    [provider]  ondansetron (ZOFRAN) 8 MG tablet Take 1 tablet (8 mg total) by mouth every 8 (eight) hours as needed for nausea or vomiting. Start on the 3rd day after chemotherapy. Patient not taking: Reported on 12/18/2022 11/20/22   Josph Macho, MD  pantoprazole (PROTONIX) 40 MG tablet Take 1 tablet (40 mg total) by mouth daily. 12/18/22   Josph Macho, MD  predniSONE (DELTASONE) 10 MG tablet Take 2 tablets (20 mg total) by mouth daily with breakfast. 12/18/22   Josph Macho, MD  prochlorperazine (COMPAZINE) 10 MG tablet Take 1 tablet (10 mg total) by mouth every 6 (six) hours as needed for nausea or vomiting. Patient not taking: Reported on 12/18/2022 11/20/22    Josph Macho, MD  tamsulosin (FLOMAX) 0.4 MG CAPS capsule Take 1 capsule (0.4 mg total) by mouth in the morning and at bedtime. 07/28/22   Donnita Falls, FNP    Family History Family History  Problem Relation Age of Onset   Cancer Mother        pancreatic   COPD Father    Diabetes Sister    Diabetes Brother    Diabetes Brother     Social History Social History   Tobacco Use   Smoking status: Former    Current packs/day: 0.00    Average packs/day: 0.5 packs/day for 23.0 years (11.5 ttl pk-yrs)    Types: Cigarettes    Start date: 1966    Quit date: 1989    Years since quitting: 36.0   Smokeless tobacco: Never  Vaping Use   Vaping status: Never Used  Substance Use Topics   Alcohol use: Not Currently    Comment: rarely   Drug use: No     Allergies   Levaquin [levofloxacin]   Review of Systems Review of Systems  Respiratory:  Positive for cough.      Physical Exam Triage Vital Signs ED Triage Vitals [12/29/22 1644]  Encounter Vitals Group     BP 122/77     Systolic BP Percentile      Diastolic BP Percentile      Pulse Rate 68     Resp 18     Temp 98.6 F (37 C)     Temp Source Oral     SpO2 96 %     Weight      Height      Head Circumference      Peak Flow      Pain Score      Pain Loc      Pain Education      Exclude from Growth Chart    No data found.  Updated Vital Signs BP 122/77 (BP Location: Right Arm)   Pulse 68   Temp 98.6 F (37 C) (Oral)   Resp 18   SpO2 96%   Visual Acuity Right Eye Distance:   Left Eye Distance:   Bilateral Distance:    Right Eye Near:   Left Eye Near:    Bilateral Near:     Physical Exam Constitutional:      Appearance: He is well-developed.  HENT:     Head: Normocephalic and atraumatic.     Nose: Congestion and rhinorrhea present.     Mouth/Throat:     Pharynx: Oropharyngeal exudate and posterior oropharyngeal erythema present.  Eyes:     Extraocular Movements: Extraocular movements intact.      Conjunctiva/sclera: Conjunctivae normal.     Pupils: Pupils are equal, round, and reactive to light.  Neck:     Thyroid: No thyromegaly.     Trachea: No tracheal deviation.  Cardiovascular:     Rate and Rhythm: Normal rate and regular rhythm.     Heart sounds: Normal heart sounds.  Pulmonary:     Effort: Pulmonary effort is normal.     Breath sounds: Normal breath sounds.  Musculoskeletal:     Cervical back: Normal range of motion and neck supple.  Skin:    General: Skin is warm and dry.  Neurological:     Mental Status: He is alert and oriented to person, place, and time. Mental status is at baseline.  Psychiatric:        Behavior: Behavior normal.        Thought Content: Thought content normal.        Judgment: Judgment normal.      UC Treatments / Results  Labs (all labs ordered are listed, but only abnormal results are displayed) Labs Reviewed - No data to display  EKG   Radiology No results found.  Procedures Procedures (including critical care time)  Medications Ordered in UC Medications - No data to display  Initial Impression / Assessment and Plan / UC Course  I have reviewed the triage vital signs and the nursing notes.  Pertinent labs & imaging results that were available during my care of the patient were reviewed by me and considered in my medical decision making (see chart for details).   Presents today with a 8-day history of worsening nasal congestion and acute cough.  Lung exams auscultated no rales or rhonchi present on exam.  Patient has significant purulent nasal discharge mucosal edema therefore highly suspicious for bacterial sinusitis.  Covering patient empirically with Augmentin twice daily for 7 days and Tessalon Perles 3 times daily as needed for cough.  Patient's were advised that RSV testing is not offered in our clinic at this time.  Patient encouraged to symptoms worsen or do not improve to follow-up with primary care doctor. Final  Clinical Impressions(s) / UC Diagnoses   Final diagnoses:  Acute non-recurrent sinusitis of other sinus  Subacute cough     Discharge Instructions      Hold Keflex and until you complete Augmentin. Drink plenty of fluids.      ED Prescriptions     Medication Sig Dispense Auth. Provider   amoxicillin-clavulanate (AUGMENTIN) 875-125 MG tablet Take 1 tablet by mouth 2 (two) times daily for 7 days. 14 tablet Bing Neighbors, NP   benzonatate (TESSALON) 200 MG capsule Take 1 capsule (200 mg total) by mouth 3 (three) times daily as needed for cough. 60 capsule Bing Neighbors, NP      PDMP not reviewed this encounter.   Bing Neighbors, NP 12/30/22 1434

## 2022-12-29 NOTE — ED Triage Notes (Signed)
Pt presents to UC for c/o productive cough x8 days. Worse when lying  down. Pt goes to blood cancer treatment. Pt would like RSV testing.

## 2022-12-29 NOTE — Discharge Instructions (Signed)
Hold Keflex and until you complete Augmentin. Drink plenty of fluids.

## 2022-12-30 ENCOUNTER — Ambulatory Visit
Admission: RE | Admit: 2022-12-30 | Discharge: 2022-12-30 | Disposition: A | Discharge status: Home / Self Care | Payer: Medicare Other | Source: Ambulatory Visit | Attending: Family Medicine | Admitting: Family Medicine

## 2022-12-30 ENCOUNTER — Ambulatory Visit: Payer: Medicare Other

## 2022-12-30 VITALS — BP 110/74 | HR 75 | Temp 98.1°F | Resp 18

## 2022-12-30 DIAGNOSIS — R059 Cough, unspecified: Secondary | ICD-10-CM | POA: Diagnosis not present

## 2022-12-30 DIAGNOSIS — R058 Other specified cough: Secondary | ICD-10-CM | POA: Diagnosis not present

## 2022-12-30 DIAGNOSIS — J069 Acute upper respiratory infection, unspecified: Secondary | ICD-10-CM | POA: Diagnosis not present

## 2022-12-30 DIAGNOSIS — R069 Unspecified abnormalities of breathing: Secondary | ICD-10-CM | POA: Diagnosis not present

## 2022-12-30 MED ORDER — DOXYCYCLINE HYCLATE 100 MG PO CAPS: 100.0000 mg | ORAL_CAPSULE | Freq: Two times a day (BID) | ORAL | 0 refills | Status: AC

## 2022-12-30 MED ORDER — PREDNISONE 50 MG PO TABS: ORAL_TABLET | ORAL | 0 refills | Status: DC

## 2022-12-30 MED ORDER — HYDROCODONE BIT-HOMATROP MBR 5-1.5 MG/5ML PO SOLN: 5.0000 mL | Freq: Four times a day (QID) | ORAL | 0 refills | Status: DC | PRN

## 2022-12-30 NOTE — Discharge Instructions (Addendum)
Advised patient chest x-ray results were negative for pneumonia.  Hardcopy and images provided to patient and wife.  Advised patient to discontinue all Augmentin after fourth dose this evening.  Advised patient to start new medications prescribed today tomorrow, 12/31/2022.  Advised patient to take prednisone with first dose of doxycycline for the next 5 of 7 days.  Advised may use Hycodan cough syrup at night prior to sleep for cough due to sedative effects.  Encouraged increase daily water intake to 64 ounces per day while taking his medications advised patient if symptoms worsen and/or unresolved please follow-up PCP or here for further evaluation.

## 2022-12-30 NOTE — ED Provider Notes (Signed)
[facility]    CSN: [id number] Arrival date & time: 12/30/22  1428      History   Chief Complaint Chief Complaint  Patient presents with   Cough    Entered by patient    HPI Jason Abbott. is a 77 y.o. male.   HPI 77 year old male presents with cough for 9 days patient was evaluated at urgent care window over yesterday.  Patient and wife are concerned with RSV.  Patient/wife report taking 3 doses of #14 Augmentin prescribed yesterday at urgent care window over.  PMH significant for CLL, aortic arch aneurysm, and stroke.  Patient is accompanied by his wife this afternoon.  Past Medical History:  Diagnosis Date   Aberrant right subclavian artery    Aortic arch aneurysm (HCC)    Arthritis    Bowel obstruction (HCC)    CLL (chronic lymphocytic leukemia) (HCC)    COVID 2021   hospitalized for it   Diverticul disease small and large intestine, no perforati or abscess    GERD (gastroesophageal reflux disease)    uses tums as needed   H/O urinary infection    Pneumonia 09/22/2021   Shingles outbreak 10/03/2013   Stroke (HCC) 01/2019    Patient Active Problem List   Diagnosis Date Noted   Hallucination, prednisone induced 10/22/2022   Recurrent UTI 07/24/2022   Atrial fibrillation (HCC) 01/21/2022   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/17/2021   Hyponatremia 09/13/2021   Hypoalbuminemia 09/13/2021   BPH (benign prostatic hyperplasia) 09/13/2021   Thrombocytopenia (HCC) 09/09/2021   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   Ingrown right greater toenail 08/12/2021   Right hand pain 03/11/2021   Tinnitus 03/11/2021   Former smoker 12/26/2020   Renal cyst 12/26/2020   Elevated TSH 09/12/2020   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   CLL (chronic lymphocytic leukemia) (HCC) 02/23/2019   Aortic arch aneurysm (HCC) 02/09/2019   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/27/2019   Lumbar spinal stenosis  12/21/2018   Numbness and tingling in both hands 10/20/2018   Hyperlipidemia, mixed 06/01/2018   Seborrheic keratosis 01/12/2018   Annual physical exam 04/26/2015   Dyspepsia 02/23/2014   Peptic ulcer disease 02/23/2014   Right shoulder pain 10/03/2013    Past Surgical History:  Procedure Laterality Date   AORTIC ARCH DEBRANCHING N/A 01/10/2022   Procedure: AORTIC ARCH DEBRANCHING WITH 16 X 8 X 10 MM HEMASHIELD GOLD GRAFT;  Surgeon: Alleen Borne, MD;  Location: [facility] OR;  Service: Open Heart Surgery;  Laterality: N/A;   CAROTID-SUBCLAVIAN BYPASS GRAFT Right 12/18/2021   Procedure: RIGHT BYPASS GRAFT CAROTID-SUBCLAVIAN;  Surgeon: Nada Libman, MD;  Location: [facility] OR;  Service: Vascular;  Laterality: Right;   CARPAL TUNNEL RELEASE Right 11/25/2021   Procedure: CARPAL TUNNEL RELEASE;  Surgeon: Marlyne Beards, MD;  Location: [facility] OR;  Service: Orthopedics;  Laterality: Right;  or MAC with regional block 60   HERNIA REPAIR     MECKEL DIVERTICULUM EXCISION     ORIF FINGER / THUMB FRACTURE     TEE WITHOUT CARDIOVERSION N/A 01/10/2022   Procedure: TRANSESOPHAGEAL ECHOCARDIOGRAM (TEE);  Surgeon: Alleen Borne, MD;  Location: [facility] OR;  Service: Open Heart Surgery;  Laterality: N/A;   TENOSYNOVECTOMY Right 11/25/2021   Procedure: FLEXOR TENOSYNOVECTOMY;  Surgeon: Marlyne Beards, MD;  Location: [facility] OR;  Service: Orthopedics;  Laterality: Right;  or MAC with regional block 60   THORACIC AORTIC ENDOVASCULAR STENT GRAFT N/A 01/10/2022  Procedure: THORACIC AORTIC ENDOVASCULAR STENT GRAFT;  Surgeon: Nada Libman, MD;  Location: [facility] OR;  Service: Vascular;  Laterality: N/A;   ulna nerve surgery         Home Medications    Prior to Admission medications   Medication Sig Start Date End Date Taking? Authorizing Provider  aspirin EC 81 MG tablet Take 1 tablet (81 mg total) by mouth daily at 6 (six) AM. Swallow whole. 12/20/21  Yes Schuh, McKenzi P, PA-C  benzonatate (TESSALON) 200 MG capsule  Take 1 capsule (200 mg total) by mouth 3 (three) times daily as needed for cough. 12/29/22  Yes Bing Neighbors, NP  cephALEXin (KEFLEX) 250 MG capsule Take 1 capsule (250 mg total) by mouth daily. 12/09/22  Yes Joline Maxcy, MD  Cranberry-Vitamin C-Probiotic (AZO CRANBERRY PO) Take 1 each by mouth in the morning.   Yes [provider]  Cyanocobalamin (VITAMIN B 12 PO) Take 1 tablet by mouth in the morning.   Yes [provider]  doxycycline (VIBRAMYCIN) 100 MG capsule Take 1 capsule (100 mg total) by mouth 2 (two) times daily for 7 days. 12/30/22 01/06/23 Yes Trevor Iha, FNP  doxylamine, Sleep, (UNISOM) 25 MG tablet Take 25 mg by mouth at bedtime as needed.   Yes [provider]  famciclovir (FAMVIR) 250 MG tablet TAKE 1 TABLET BY MOUTH TWICE A DAY 09/11/22  Yes Ennever, Rose Phi, MD  finasteride (PROSCAR) 5 MG tablet Take 1 tablet (5 mg total) by mouth daily. 08/08/22  Yes Joline Maxcy, MD  folic acid (FOLVITE) 1 MG tablet Take 2 tablets (2 mg total) by mouth daily. 09/04/22  Yes Ennever, Rose Phi, MD  HYDROcodone bit-homatropine (HYCODAN) 5-1.5 MG/5ML syrup Take 5 mLs by mouth every 6 (six) hours as needed for cough. 12/30/22  Yes Trevor Iha, FNP  Multiple Vitamins-Minerals (MULTIVITAMIN GUMMIES ADULTS PO) Take 2 tablets by mouth in the morning.   Yes [provider]  ondansetron (ZOFRAN) 8 MG tablet Take 1 tablet (8 mg total) by mouth every 8 (eight) hours as needed for nausea or vomiting. Start on the 3rd day after chemotherapy. 11/20/22  Yes Josph Macho, MD  pantoprazole (PROTONIX) 40 MG tablet Take 1 tablet (40 mg total) by mouth daily. 12/18/22  Yes Josph Macho, MD  predniSONE (DELTASONE) 10 MG tablet Take 2 tablets (20 mg total) by mouth daily with breakfast. 12/18/22  Yes Ennever, Rose Phi, MD  predniSONE (DELTASONE) 50 MG tablet Take 1 tab p.o. daily for 5 days. 12/30/22  Yes Trevor Iha, FNP  prochlorperazine (COMPAZINE) 10 MG  tablet Take 1 tablet (10 mg total) by mouth every 6 (six) hours as needed for nausea or vomiting. 11/20/22  Yes Josph Macho, MD  tamsulosin (FLOMAX) 0.4 MG CAPS capsule Take 1 capsule (0.4 mg total) by mouth in the morning and at bedtime. 07/28/22  Yes Donnita Falls, FNP    Family History Family History  Problem Relation Age of Onset   Cancer Mother        pancreatic   COPD Father    Diabetes Sister    Diabetes Brother    Diabetes Brother     Social History Social History   Tobacco Use   Smoking status: Former    Current packs/day: 0.00    Average packs/day: 0.5 packs/day for 23.0 years (11.5 ttl pk-yrs)    Types: Cigarettes    Start date: 1966    Quit date: 1989  Years since quitting: 36.0   Smokeless tobacco: Never  Vaping Use   Vaping status: Never Used  Substance Use Topics   Alcohol use: Not Currently    Comment: rarely   Drug use: No     Allergies   Levaquin [levofloxacin]   Review of Systems Review of Systems  HENT:  Positive for congestion.   Respiratory:  Positive for cough.      Physical Exam Triage Vital Signs ED Triage Vitals  Encounter Vitals Group     BP 12/30/22 1446 110/74     Systolic BP Percentile --      Diastolic BP Percentile --      Pulse Rate 12/30/22 1446 75     Resp 12/30/22 1446 18     Temp 12/30/22 1446 98.1 F (36.7 C)     Temp Source 12/30/22 1446 Oral     SpO2 12/30/22 1446 97 %     Weight --      Height --      Head Circumference --      Peak Flow --      Pain Score 12/30/22 1442 0     Pain Loc --      Pain Education --      Exclude from Growth Chart --    No data found.  Updated Vital Signs BP 110/74 (BP Location: Right Arm)   Pulse 75   Temp 98.1 F (36.7 C) (Oral)   Resp 18   SpO2 97%    Physical Exam Vitals and nursing note reviewed.  Constitutional:      General: He is not in acute distress.    Appearance: Normal appearance. He is obese. He is not ill-appearing.  HENT:     Head:  Normocephalic and atraumatic.     Right Ear: Tympanic membrane, ear canal and external ear normal.     Left Ear: Tympanic membrane, ear canal and external ear normal.     Nose: Nose normal.     Mouth/Throat:     Mouth: Mucous membranes are moist.     Pharynx: Oropharynx is clear.  Eyes:     Extraocular Movements: Extraocular movements intact.     Conjunctiva/sclera: Conjunctivae normal.     Pupils: Pupils are equal, round, and reactive to light.  Cardiovascular:     Rate and Rhythm: Normal rate and regular rhythm.     Pulses: Normal pulses.     Heart sounds: Normal heart sounds.  Pulmonary:     Effort: Pulmonary effort is normal.     Breath sounds: Normal breath sounds. No wheezing, rhonchi or rales.     Comments: Infrequent nonproductive cough Musculoskeletal:        General: Normal range of motion.     Cervical back: Normal range of motion and neck supple.  Skin:    General: Skin is warm and dry.  Neurological:     General: No focal deficit present.     Mental Status: He is alert and oriented to person, place, and time. Mental status is at baseline.  Psychiatric:        Mood and Affect: Mood normal.        Behavior: Behavior normal.      UC Treatments / Results  Labs (all labs ordered are listed, but only abnormal results are displayed) Labs Reviewed - No data to display  EKG   Radiology DG Chest 2 View Result Date: 12/30/2022 CLINICAL DATA:  Productive cough for 9 days. Abnormal breath  sounds. EXAM: CHEST - 2 VIEW COMPARISON:  02/12/2022 FINDINGS: The heart size is normal. Aortic arch stent graft and prior median sternotomy again noted. Both lungs are clear. The visualized skeletal structures are unremarkable. IMPRESSION: No active cardiopulmonary disease. Electronically Signed   By: Danae Orleans M.D.   On: 12/30/2022 15:17    Procedures Procedures (including critical care time)  Medications Ordered in UC Medications - No data to display  Initial Impression /  Assessment and Plan / UC Course  I have reviewed the triage vital signs and the nursing notes.  Pertinent labs & imaging results that were available during my care of the patient were reviewed by me and considered in my medical decision making (see chart for details).     MDM: 1.  Cough, unspecified type-patient prescribed Augmentin twice daily for 7 days by urgent care window over on 12/29/2022. Advised patient chest x-ray results were negative for pneumonia.  Hardcopy and images provided to patient and wife.  Advised patient to discontinue all Augmentin after fourth dose this evening.  Advised patient to start new medications prescribed today tomorrow, 12/31/2022.  Advised patient to take prednisone with first dose of doxycycline for the next 5 of 7 days.  Advised may use Hycodan cough syrup at night prior to sleep for cough due to sedative effects.  Encouraged increase daily water intake to 64 ounces per day while taking his medications advised patient if symptoms worsen and/or unresolved please follow-up PCP or here for further evaluation.  Final Clinical Impressions(s) / UC Diagnoses   Final diagnoses:  Cough, unspecified type  Acute upper respiratory infection     Discharge Instructions      Advised patient chest x-ray results were negative for pneumonia.  Hardcopy and images provided to patient and wife.  Advised patient to discontinue all Augmentin after fourth dose this evening.  Advised patient to start new medications prescribed today tomorrow, 12/31/2022.  Advised patient to take prednisone with first dose of doxycycline for the next 5 of 7 days.  Advised may use Hycodan cough syrup at night prior to sleep for cough due to sedative effects.  Encouraged increase daily water intake to 64 ounces per day while taking his medications advised patient if symptoms worsen and/or unresolved please follow-up PCP or here for further evaluation.     ED Prescriptions     Medication Sig  Dispense Auth. Provider   doxycycline (VIBRAMYCIN) 100 MG capsule Take 1 capsule (100 mg total) by mouth 2 (two) times daily for 7 days. 14 capsule Trevor Iha, FNP   predniSONE (DELTASONE) 50 MG tablet Take 1 tab p.o. daily for 5 days. 5 tablet Trevor Iha, FNP   HYDROcodone bit-homatropine (HYCODAN) 5-1.5 MG/5ML syrup Take 5 mLs by mouth every 6 (six) hours as needed for cough. 120 mL Trevor Iha, FNP      I have reviewed the PDMP during this encounter.   Trevor Iha, FNP 12/30/22 1545

## 2022-12-30 NOTE — ED Triage Notes (Signed)
Patient presents to Urgent Care with complaints of productive cough since 9 days ago. Patient wife reports being seen at urgent care for cough. Cough worst at night. Unable to sleep due to the cough. Does feel like a tickle. Has always felt shortness of breath over 1 year. Denies wheezing.

## 2023-01-05 ENCOUNTER — Other Ambulatory Visit: Payer: Self-pay | Admitting: Hematology & Oncology

## 2023-01-05 MED ORDER — FLUCONAZOLE 100 MG PO TABS: 100.0000 mg | ORAL_TABLET | Freq: Every day | ORAL | 2 refills | Status: DC

## 2023-01-08 ENCOUNTER — Encounter: Payer: Self-pay | Admitting: Hematology & Oncology

## 2023-01-08 ENCOUNTER — Inpatient Hospital Stay: Payer: Medicare Other | Attending: Hematology & Oncology

## 2023-01-08 ENCOUNTER — Inpatient Hospital Stay (HOSPITAL_BASED_OUTPATIENT_CLINIC_OR_DEPARTMENT_OTHER): Payer: Medicare Other | Admitting: Hematology & Oncology

## 2023-01-08 VITALS — BP 140/51 | HR 64 | Temp 97.5°F | Resp 20 | Ht 70.0 in | Wt 194.8 lb

## 2023-01-08 DIAGNOSIS — Z79899 Other long term (current) drug therapy: Secondary | ICD-10-CM | POA: Insufficient documentation

## 2023-01-08 DIAGNOSIS — D591 Autoimmune hemolytic anemia, unspecified: Secondary | ICD-10-CM | POA: Diagnosis present

## 2023-01-08 DIAGNOSIS — Z7982 Long term (current) use of aspirin: Secondary | ICD-10-CM | POA: Insufficient documentation

## 2023-01-08 DIAGNOSIS — Z7952 Long term (current) use of systemic steroids: Secondary | ICD-10-CM | POA: Insufficient documentation

## 2023-01-08 DIAGNOSIS — C9110 Chronic lymphocytic leukemia of B-cell type not having achieved remission: Secondary | ICD-10-CM | POA: Insufficient documentation

## 2023-01-08 LAB — CMP (CANCER CENTER ONLY)
ALT: 10 U/L (ref 0–44)
AST: 14 U/L — ABNORMAL LOW (ref 15–41)
Albumin: 4.3 g/dL (ref 3.5–5.0)
Alkaline Phosphatase: 51 U/L (ref 38–126)
Anion gap: 8 (ref 5–15)
BUN: 20 mg/dL (ref 8–23)
CO2: 29 mmol/L (ref 22–32)
Calcium: 8.7 mg/dL — ABNORMAL LOW (ref 8.9–10.3)
Chloride: 104 mmol/L (ref 98–111)
Creatinine: 0.92 mg/dL (ref 0.61–1.24)
GFR, Estimated: >60 mL/min (ref >60)
Glucose, Bld: 122 mg/dL — ABNORMAL HIGH (ref 70–99)
Potassium: 4.1 mmol/L (ref 3.5–5.1)
Sodium: 141 mmol/L (ref 135–145)
Total Bilirubin: 0.7 mg/dL (ref 0.0–1.2)
Total Protein: 6.2 g/dL — ABNORMAL LOW (ref 6.5–8.1)

## 2023-01-08 LAB — CBC WITH DIFFERENTIAL (CANCER CENTER ONLY)
Abs Immature Granulocytes: 0.16 10*3/uL — ABNORMAL HIGH (ref 0.00–0.07)
Basophils Absolute: 0 10*3/uL (ref 0.0–0.1)
Basophils Relative: 0 %
Eosinophils Absolute: 0 10*3/uL (ref 0.0–0.5)
Eosinophils Relative: 0 %
HCT: 37.2 % — ABNORMAL LOW (ref 39.0–52.0)
Hemoglobin: 12.6 g/dL — ABNORMAL LOW (ref 13.0–17.0)
Immature Granulocytes: 2 %
Lymphocytes Relative: 13 %
Lymphs Abs: 1 10*3/uL (ref 0.7–4.0)
MCH: 35 pg — ABNORMAL HIGH (ref 26.0–34.0)
MCHC: 33.9 g/dL (ref 30.0–36.0)
MCV: 103.3 fL — ABNORMAL HIGH (ref 80.0–100.0)
Monocytes Absolute: 0.5 10*3/uL (ref 0.1–1.0)
Monocytes Relative: 7 %
Neutro Abs: 5.8 10*3/uL (ref 1.7–7.7)
Neutrophils Relative %: 78 %
Platelet Count: 135 10*3/uL — ABNORMAL LOW (ref 150–400)
RBC: 3.6 MIL/uL — ABNORMAL LOW (ref 4.22–5.81)
RDW: 14.6 % (ref 11.5–15.5)
WBC Count: 7.5 10*3/uL (ref 4.0–10.5)
nRBC: 0 % (ref 0.0–0.2)

## 2023-01-08 LAB — LACTATE DEHYDROGENASE: LDH: 231 U/L — ABNORMAL HIGH (ref 98–192)

## 2023-01-08 NOTE — Progress Notes (Signed)
 Hematology and Oncology Follow Up Visit  Jason Abbott [id number] 04-12-1945 78 y.o. 01/08/2023   Principle Diagnosis:  Stage C CLL -autoimmune hemolytic anemia  Current Therapy:   Prednisone  80 mg p.o. daily-started on 08/20/2022 --decrease to 40 mg p.o. daily on 09/04/2022 Prednisone  10 mg p.o. daily-changes on 10/02/2022 -DC on 11/13/2022 Rituxan /Bendamustine -start treatment on 11/20/2022 -status post cycle 1-DC on 12/18/2022 due to poor tolerance   Interim History:  Jason Abbott is here today for follow-up.  He is doing so much better now.  I think the prednisone  may have helped him.  His blood count is better.  He is more energetic.  He is eating more.  He has had no problem with fever.  He did have this cold.  It sounds like he may have had COVID but it was never checked.  He has had no rashes.  There is been no leg swelling.  I am just happy that he is feeling better now.  He has had no nausea or vomiting.  He has had no headache.  He has had no mouth sores.  He is still not has had his echocardiogram.  Overall, I would say that his performance status is probably ECOG 1.   Medications:  Allergies as of 01/08/2023       Reactions   Levaquin  [levofloxacin ] Other (See Comments)   Due to medical hx        Medication List        Accurate as of January 08, 2023  3:20 PM. If you have any questions, ask your nurse or doctor.          STOP taking these medications    benzonatate  200 MG capsule Commonly known as: TESSALON  Stopped by: Jason Abbott   HYDROcodone  bit-homatropine 5-1.5 MG/5ML syrup Commonly known as: HYCODAN Stopped by: Jason Abbott   pantoprazole  40 MG tablet Commonly known as: Protonix  Stopped by: Jason Abbott       TAKE these medications    aspirin  EC 81 MG tablet Take 1 tablet (81 mg total) by mouth daily at 6 (six) AM. Swallow whole.   AZO CRANBERRY PO Take 1 each by mouth in the morning.   cephALEXin  250 MG  capsule Commonly known as: KEFLEX  Take 1 capsule (250 mg total) by mouth daily.   doxylamine  (Sleep) 25 MG tablet Commonly known as: UNISOM  Take 25 mg by mouth at bedtime as needed.   famciclovir  250 MG tablet Commonly known as: FAMVIR  TAKE 1 TABLET BY MOUTH TWICE A DAY   finasteride  5 MG tablet Commonly known as: PROSCAR  Take 1 tablet (5 mg total) by mouth daily.   fluconazole  100 MG tablet Commonly known as: DIFLUCAN  Take 1 tablet (100 mg total) by mouth daily.   folic acid  1 MG tablet Commonly known as: FOLVITE  Take 2 tablets (2 mg total) by mouth daily.   MULTIVITAMIN GUMMIES ADULTS PO Take 2 tablets by mouth in the morning.   ondansetron  8 MG tablet Commonly known as: ZOFRAN  Take 1 tablet (8 mg total) by mouth every 8 (eight) hours as needed for nausea or vomiting. Start on the 3rd day after chemotherapy.   predniSONE  10 MG tablet Commonly known as: DELTASONE  Take 2 tablets (20 mg total) by mouth daily with breakfast. What changed:  how much to take Another medication with the same name was removed. Continue taking this medication, and follow the directions you see here.   prochlorperazine  10 MG tablet Commonly known as:  COMPAZINE  Take 1 tablet (10 mg total) by mouth every 6 (six) hours as needed for nausea or vomiting.   tamsulosin  0.4 MG Caps capsule Commonly known as: FLOMAX  Take 1 capsule (0.4 mg total) by mouth in the morning and at bedtime.   VITAMIN B 12 PO Take 1 tablet by mouth in the morning.        Allergies:  Allergies  Allergen Reactions   Levaquin  [Levofloxacin ] Other (See Comments)    Due to medical hx    Past Medical History, Surgical history, Social history, and Family History were reviewed and updated.  Review of Systems: Review of Systems  Constitutional: Negative.   HENT: Negative.    Eyes: Negative.   Respiratory: Negative.    Cardiovascular: Negative.   Gastrointestinal: Negative.   Genitourinary: Negative.    Musculoskeletal: Negative.   Skin: Negative.   Neurological: Negative.   Endo/Heme/Allergies: Negative.   Psychiatric/Behavioral: Negative.       Physical Exam:  height is 5' 10 (1.778 m) and weight is 194 lb 12.8 oz (88.4 kg). His oral temperature is 97.5 F (36.4 C) (abnormal). His blood pressure is 140/51 (abnormal) and his pulse is 64. His respiration is 20 and oxygen  saturation is 100%.   Wt Readings from Last 3 Encounters:  01/08/23 194 lb 12.8 oz (88.4 kg)  12/18/22 188 lb 6.4 oz (85.5 kg)  12/09/22 183 lb (83 kg)    Physical Exam Vitals reviewed.  HENT:     Head: Normocephalic and atraumatic.  Eyes:     Pupils: Pupils are equal, round, and reactive to light.  Cardiovascular:     Rate and Rhythm: Normal rate and regular rhythm.     Heart sounds: Normal heart sounds.  Pulmonary:     Effort: Pulmonary effort is normal.     Breath sounds: Normal breath sounds.  Abdominal:     General: Bowel sounds are normal.     Palpations: Abdomen is soft.  Musculoskeletal:        General: No tenderness or deformity. Normal range of motion.     Cervical back: Normal range of motion.  Lymphadenopathy:     Cervical: No cervical adenopathy.  Skin:    General: Skin is warm and dry.     Findings: No erythema or rash.  Neurological:     Mental Status: He is alert and oriented to person, place, and time.  Psychiatric:        Behavior: Behavior normal.        Thought Content: Thought content normal.        Judgment: Judgment normal.      Lab Results  Component Value Date   WBC 7.5 01/08/2023   HGB 12.6 (L) 01/08/2023   HCT 37.2 (L) 01/08/2023   MCV 103.3 (H) 01/08/2023   PLT 135 (L) 01/08/2023   Lab Results  Component Value Date   FERRITIN 41 11/28/2022   IRON 84 11/28/2022   TIBC 326 11/28/2022   UIBC 242 11/28/2022   IRONPCTSAT 26 11/28/2022   Lab Results  Component Value Date   RETICCTPCT 3.9 (H) 12/18/2022   RBC 3.60 (L) 01/08/2023   Lab Results  Component  Value Date   KPAFRELGTCHN 12.8 08/26/2021   LAMBDASER 24.9 08/26/2021   KAPLAMBRATIO 0.51 08/26/2021   Lab Results  Component Value Date   IGGSERUM 808 08/26/2021   IGA 149 08/26/2021   IGMSERUM 39 08/26/2021   No results found for: TOTALPROTELP, ALBUMINELP, A1GS, A2GS, BETS, BETA2SER, GAMS, MSPIKE,  SPEI   Chemistry      Component Value Date/Time   NA 142 12/18/2022 1007   NA 142 11/05/2022 1127   K 4.0 12/18/2022 1007   CL 105 12/18/2022 1007   CO2 29 12/18/2022 1007   BUN 21 12/18/2022 1007   BUN 18 11/05/2022 1127   CREATININE 1.04 12/18/2022 1007   CREATININE 1.03 08/15/2021 0000      Component Value Date/Time   CALCIUM  9.3 12/18/2022 1007   ALKPHOS 61 12/18/2022 1007   AST 16 12/18/2022 1007   ALT 8 12/18/2022 1007   BILITOT 0.8 12/18/2022 1007       Impression and Plan: Mr. Tennyson is a very pleasant 78 yo caucasian gentleman with stage C CLL.  He developed autoimmune hemolytic anemia.  We started him on prednisone .  He had a very nice response.  He has had 1 cycle of chemotherapy with Rituxan /Bendamustine .  He had a very poor tolerance to treatment.  As such, we have stopped this.  I am just happy that he is doing much better now.  He is [place] is doing what he would like to do.  He is playing the accordion again.  We will keep him on the 10 mg of prednisone  daily.  I think this is a good idea for him.  His labs look quite good.  I happy that his lymphocyte percentage is so low.  We will now get him back to see us  in about a month or so.  Hopefully, we can keep him off treatment for as long as possible.    Jason JONELLE Crease, MD 1/2/20253:20 PM

## 2023-01-22 ENCOUNTER — Ambulatory Visit (HOSPITAL_COMMUNITY): Payer: Medicare Other | Attending: Internal Medicine

## 2023-01-22 DIAGNOSIS — R55 Syncope and collapse: Secondary | ICD-10-CM | POA: Insufficient documentation

## 2023-01-22 DIAGNOSIS — C9110 Chronic lymphocytic leukemia of B-cell type not having achieved remission: Secondary | ICD-10-CM | POA: Diagnosis not present

## 2023-01-23 LAB — ECHOCARDIOGRAM COMPLETE
Area-P 1/2: 2.77 cm2
S' Lateral: 3.9 cm

## 2023-01-31 ENCOUNTER — Other Ambulatory Visit: Payer: Self-pay | Admitting: Hematology & Oncology

## 2023-02-06 ENCOUNTER — Inpatient Hospital Stay (HOSPITAL_BASED_OUTPATIENT_CLINIC_OR_DEPARTMENT_OTHER): Payer: Medicare Other | Admitting: Hematology & Oncology

## 2023-02-06 ENCOUNTER — Encounter: Payer: Self-pay | Admitting: Hematology & Oncology

## 2023-02-06 ENCOUNTER — Inpatient Hospital Stay: Payer: Medicare Other

## 2023-02-06 VITALS — BP 108/57 | HR 78 | Temp 97.8°F | Resp 18 | Ht 70.0 in | Wt 195.8 lb

## 2023-02-06 DIAGNOSIS — Z7952 Long term (current) use of systemic steroids: Secondary | ICD-10-CM | POA: Diagnosis not present

## 2023-02-06 DIAGNOSIS — C9110 Chronic lymphocytic leukemia of B-cell type not having achieved remission: Secondary | ICD-10-CM

## 2023-02-06 DIAGNOSIS — Z7982 Long term (current) use of aspirin: Secondary | ICD-10-CM | POA: Diagnosis not present

## 2023-02-06 DIAGNOSIS — D591 Autoimmune hemolytic anemia, unspecified: Secondary | ICD-10-CM | POA: Diagnosis not present

## 2023-02-06 DIAGNOSIS — Z79899 Other long term (current) drug therapy: Secondary | ICD-10-CM | POA: Diagnosis not present

## 2023-02-06 LAB — CBC WITH DIFFERENTIAL (CANCER CENTER ONLY)
Abs Immature Granulocytes: 0.51 10*3/uL — ABNORMAL HIGH (ref 0.00–0.07)
Basophils Absolute: 0 10*3/uL (ref 0.0–0.1)
Basophils Relative: 1 %
Eosinophils Absolute: 0 10*3/uL (ref 0.0–0.5)
Eosinophils Relative: 1 %
HCT: 40.5 % (ref 39.0–52.0)
Hemoglobin: 13.4 g/dL (ref 13.0–17.0)
Immature Granulocytes: 9 %
Lymphocytes Relative: 17 %
Lymphs Abs: 1 10*3/uL (ref 0.7–4.0)
MCH: 31.7 pg (ref 26.0–34.0)
MCHC: 33.1 g/dL (ref 30.0–36.0)
MCV: 95.7 fL (ref 80.0–100.0)
Monocytes Absolute: 0.4 10*3/uL (ref 0.1–1.0)
Monocytes Relative: 7 %
Neutro Abs: 3.7 10*3/uL (ref 1.7–7.7)
Neutrophils Relative %: 65 %
Platelet Count: 100 10*3/uL — ABNORMAL LOW (ref 150–400)
RBC: 4.23 MIL/uL (ref 4.22–5.81)
RDW: 12.8 % (ref 11.5–15.5)
Smear Review: NORMAL
WBC Count: 5.7 10*3/uL (ref 4.0–10.5)
nRBC: 0 % (ref 0.0–0.2)

## 2023-02-06 LAB — CMP (CANCER CENTER ONLY)
ALT: 11 U/L (ref 0–44)
AST: 17 U/L (ref 15–41)
Albumin: 4.7 g/dL (ref 3.5–5.0)
Alkaline Phosphatase: 60 U/L (ref 38–126)
Anion gap: 7 (ref 5–15)
BUN: 18 mg/dL (ref 8–23)
CO2: 32 mmol/L (ref 22–32)
Calcium: 9.2 mg/dL (ref 8.9–10.3)
Chloride: 102 mmol/L (ref 98–111)
Creatinine: 1.1 mg/dL (ref 0.61–1.24)
GFR, Estimated: >60 mL/min (ref >60)
Glucose, Bld: 138 mg/dL — ABNORMAL HIGH (ref 70–99)
Potassium: 4.5 mmol/L (ref 3.5–5.1)
Sodium: 141 mmol/L (ref 135–145)
Total Bilirubin: 0.6 mg/dL (ref 0.0–1.2)
Total Protein: 6.6 g/dL (ref 6.5–8.1)

## 2023-02-06 LAB — RETICULOCYTES
Immature Retic Fract: 14.7 % (ref 2.3–15.9)
RBC.: 4.19 MIL/uL — ABNORMAL LOW (ref 4.22–5.81)
Retic Count, Absolute: 76.7 10*3/uL (ref 19.0–186.0)
Retic Ct Pct: 1.8 % (ref 0.4–3.1)

## 2023-02-06 LAB — SAVE SMEAR(SSMR), FOR PROVIDER SLIDE REVIEW

## 2023-02-06 LAB — LACTATE DEHYDROGENASE: LDH: 249 U/L — ABNORMAL HIGH (ref 98–192)

## 2023-02-06 NOTE — Progress Notes (Signed)
Hematology and Oncology Follow Up Visit  Jason Abbott [id number] 07-Sep-1945 78 y.o. 02/06/2023   Principle Diagnosis:  Stage C CLL -autoimmune hemolytic anemia  Current Therapy:   Prednisone 80 mg p.o. daily-started on 08/20/2022 --decrease to 40 mg p.o. daily on 09/04/2022 Prednisone 10 mg p.o. daily Rituxan/Bendamustine-start treatment on 11/20/2022 -status post cycle 1-DC on 12/18/2022 due to poor tolerance   Interim History:  Jason Abbott is here today for follow-up.  Every time that I see him, he looks better and better.  I am just very happy about this.  The 10 mg of prednisone seems to be doing quite well for him.  He is tolerating this quite nicely.  He is doing so much better now.  I think that his appetite is doing better.  He is having no problems with nausea or vomiting.  He clearly has more energy.  He has not noted any fever.  There has been no bleeding.  He had an echocardiogram done recently that showed an ejection fraction of 50-55%.  He has had no problems with bleeding.  Overall, I would say that since probably, 1.    Medications:  Allergies as of 02/06/2023       Reactions   Levaquin [levofloxacin] Other (See Comments)   Due to medical hx        Medication List        Accurate as of February 06, 2023  2:17 PM. If you have any questions, ask your nurse or doctor.          aspirin EC 81 MG tablet Take 1 tablet (81 mg total) by mouth daily at 6 (six) AM. Swallow whole.   AZO CRANBERRY PO Take 1 each by mouth in the morning.   cephALEXin 250 MG capsule Commonly known as: KEFLEX Take 1 capsule (250 mg total) by mouth daily.   doxylamine (Sleep) 25 MG tablet Commonly known as: UNISOM Take 25 mg by mouth at bedtime as needed.   famciclovir 250 MG tablet Commonly known as: FAMVIR TAKE 1 TABLET BY MOUTH TWICE A DAY   finasteride 5 MG tablet Commonly known as: PROSCAR Take 1 tablet (5 mg total) by mouth daily.   fluconazole 100 MG  tablet Commonly known as: DIFLUCAN Take 1 tablet (100 mg total) by mouth daily.   folic acid 1 MG tablet Commonly known as: FOLVITE Take 2 tablets (2 mg total) by mouth daily.   MULTIVITAMIN GUMMIES ADULTS PO Take 2 tablets by mouth in the morning.   ondansetron 8 MG tablet Commonly known as: ZOFRAN Take 1 tablet (8 mg total) by mouth every 8 (eight) hours as needed for nausea or vomiting. Start on the 3rd day after chemotherapy.   predniSONE 10 MG tablet Commonly known as: DELTASONE Take 2 tablets (20 mg total) by mouth daily with breakfast. What changed: how much to take   prochlorperazine 10 MG tablet Commonly known as: COMPAZINE Take 1 tablet (10 mg total) by mouth every 6 (six) hours as needed for nausea or vomiting.   tamsulosin 0.4 MG Caps capsule Commonly known as: FLOMAX Take 1 capsule (0.4 mg total) by mouth in the morning and at bedtime.   VITAMIN B 12 PO Take 1 tablet by mouth in the morning.        Allergies:  Allergies  Allergen Reactions   Levaquin [Levofloxacin] Other (See Comments)    Due to medical hx    Past Medical History, Surgical history, Social history, and Family  History were reviewed and updated.  Review of Systems: Review of Systems  Constitutional: Negative.   HENT: Negative.    Eyes: Negative.   Respiratory: Negative.    Cardiovascular: Negative.   Gastrointestinal: Negative.   Genitourinary: Negative.   Musculoskeletal: Negative.   Skin: Negative.   Neurological: Negative.   Endo/Heme/Allergies: Negative.   Psychiatric/Behavioral: Negative.       Physical Exam:  height is 5\' 10"  (1.778 m) and weight is 195 lb 12.8 oz (88.8 kg). His oral temperature is 97.8 F (36.6 C). His blood pressure is 108/57 (abnormal) and his pulse is 78. His respiration is 18 and oxygen saturation is 96%.   Wt Readings from Last 3 Encounters:  02/06/23 195 lb 12.8 oz (88.8 kg)  01/08/23 194 lb 12.8 oz (88.4 kg)  12/18/22 188 lb 6.4 oz (85.5 kg)     Physical Exam Vitals reviewed.  HENT:     Head: Normocephalic and atraumatic.  Eyes:     Pupils: Pupils are equal, round, and reactive to light.  Cardiovascular:     Rate and Rhythm: Normal rate and regular rhythm.     Heart sounds: Normal heart sounds.  Pulmonary:     Effort: Pulmonary effort is normal.     Breath sounds: Normal breath sounds.  Abdominal:     General: Bowel sounds are normal.     Palpations: Abdomen is soft.  Musculoskeletal:        General: No tenderness or deformity. Normal range of motion.     Cervical back: Normal range of motion.  Lymphadenopathy:     Cervical: No cervical adenopathy.  Skin:    General: Skin is warm and dry.     Findings: No erythema or rash.  Neurological:     Mental Status: He is alert and oriented to person, place, and time.  Psychiatric:        Behavior: Behavior normal.        Thought Content: Thought content normal.        Judgment: Judgment normal.      Lab Results  Component Value Date   WBC 5.7 02/06/2023   HGB 13.4 02/06/2023   HCT 40.5 02/06/2023   MCV 95.7 02/06/2023   PLT 100 (L) 02/06/2023   Lab Results  Component Value Date   FERRITIN 41 11/28/2022   IRON 84 11/28/2022   TIBC 326 11/28/2022   UIBC 242 11/28/2022   IRONPCTSAT 26 11/28/2022   Lab Results  Component Value Date   RETICCTPCT 1.8 02/06/2023   RBC 4.19 (L) 02/06/2023   RBC 4.23 02/06/2023   Lab Results  Component Value Date   KPAFRELGTCHN 12.8 08/26/2021   LAMBDASER 24.9 08/26/2021   KAPLAMBRATIO 0.51 08/26/2021   Lab Results  Component Value Date   IGGSERUM 808 08/26/2021   IGA 149 08/26/2021   IGMSERUM 39 08/26/2021   No results found for: "TOTALPROTELP", "ALBUMINELP", "A1GS", "A2GS", "BETS", "BETA2SER", "GAMS", "MSPIKE", "SPEI"   Chemistry      Component Value Date/Time   NA 141 02/06/2023 1335   NA 142 11/05/2022 1127   K 4.5 02/06/2023 1335   CL 102 02/06/2023 1335   CO2 32 02/06/2023 1335   BUN 18 02/06/2023 1335    BUN 18 11/05/2022 1127   CREATININE 1.10 02/06/2023 1335   CREATININE 1.03 08/15/2021 0000      Component Value Date/Time   CALCIUM 9.2 02/06/2023 1335   ALKPHOS 60 02/06/2023 1335   AST 17 02/06/2023 1335   ALT  11 02/06/2023 1335   BILITOT 0.6 02/06/2023 1335       Impression and Plan: Jason Abbott is a very pleasant 78 yo caucasian gentleman with stage C CLL.  He developed autoimmune hemolytic anemia.  We started him on prednisone.  He had a very nice response.  He has had 1 cycle of chemotherapy with Rituxan/Bendamustine.  He had a very poor tolerance to treatment.  As such, we have stopped this.  However, I think that the 1 cycle really has helped.  For right now, he will stay on the 10 mg of prednisone.  I really think this is going to be beneficial for him.  For right now, I will plan to get him back to see [place] in 2 months.  We will get him through the Winter.    Josph Macho, MD 1/31/20252:17 PM

## 2023-02-16 ENCOUNTER — Other Ambulatory Visit: Payer: Self-pay | Admitting: *Deleted

## 2023-02-16 ENCOUNTER — Telehealth: Payer: Self-pay | Admitting: *Deleted

## 2023-02-16 ENCOUNTER — Encounter: Payer: Self-pay | Admitting: Hematology & Oncology

## 2023-02-16 DIAGNOSIS — D649 Anemia, unspecified: Secondary | ICD-10-CM

## 2023-02-16 DIAGNOSIS — C9110 Chronic lymphocytic leukemia of B-cell type not having achieved remission: Secondary | ICD-10-CM

## 2023-02-16 DIAGNOSIS — D696 Thrombocytopenia, unspecified: Secondary | ICD-10-CM

## 2023-02-16 DIAGNOSIS — D509 Iron deficiency anemia, unspecified: Secondary | ICD-10-CM

## 2023-02-16 DIAGNOSIS — N390 Urinary tract infection, site not specified: Secondary | ICD-10-CM

## 2023-02-16 DIAGNOSIS — E871 Hypo-osmolality and hyponatremia: Secondary | ICD-10-CM

## 2023-02-16 NOTE — Telephone Encounter (Signed)
 Patient is agreeable to have labs and see Teddy Fear, for decreased appetite and feeling tired and fatigued. LOS sent to scheduling.

## 2023-02-17 ENCOUNTER — Inpatient Hospital Stay (HOSPITAL_BASED_OUTPATIENT_CLINIC_OR_DEPARTMENT_OTHER): Payer: Medicare Other | Admitting: Medical Oncology

## 2023-02-17 ENCOUNTER — Inpatient Hospital Stay: Payer: Medicare Other | Attending: Hematology & Oncology

## 2023-02-17 ENCOUNTER — Encounter: Payer: Self-pay | Admitting: Medical Oncology

## 2023-02-17 VITALS — BP 132/91 | HR 95 | Temp 97.9°F | Resp 18 | Ht 70.0 in | Wt 197.8 lb

## 2023-02-17 DIAGNOSIS — D649 Anemia, unspecified: Secondary | ICD-10-CM

## 2023-02-17 DIAGNOSIS — R5383 Other fatigue: Secondary | ICD-10-CM | POA: Diagnosis not present

## 2023-02-17 DIAGNOSIS — D696 Thrombocytopenia, unspecified: Secondary | ICD-10-CM | POA: Diagnosis not present

## 2023-02-17 DIAGNOSIS — D591 Autoimmune hemolytic anemia, unspecified: Secondary | ICD-10-CM

## 2023-02-17 DIAGNOSIS — C9110 Chronic lymphocytic leukemia of B-cell type not having achieved remission: Secondary | ICD-10-CM | POA: Diagnosis not present

## 2023-02-17 DIAGNOSIS — N390 Urinary tract infection, site not specified: Secondary | ICD-10-CM

## 2023-02-17 DIAGNOSIS — E871 Hypo-osmolality and hyponatremia: Secondary | ICD-10-CM

## 2023-02-17 DIAGNOSIS — D509 Iron deficiency anemia, unspecified: Secondary | ICD-10-CM

## 2023-02-17 DIAGNOSIS — Z7952 Long term (current) use of systemic steroids: Secondary | ICD-10-CM | POA: Insufficient documentation

## 2023-02-17 LAB — CMP (CANCER CENTER ONLY)
ALT: 12 U/L (ref 0–44)
AST: 14 U/L — ABNORMAL LOW (ref 15–41)
Albumin: 4.5 g/dL (ref 3.5–5.0)
Alkaline Phosphatase: 53 U/L (ref 38–126)
Anion gap: 9 (ref 5–15)
BUN: 20 mg/dL (ref 8–23)
CO2: 31 mmol/L (ref 22–32)
Calcium: 9.6 mg/dL (ref 8.9–10.3)
Chloride: 102 mmol/L (ref 98–111)
Creatinine: 1.11 mg/dL (ref 0.61–1.24)
GFR, Estimated: >60 mL/min (ref >60)
Glucose, Bld: 125 mg/dL — ABNORMAL HIGH (ref 70–99)
Potassium: 4.4 mmol/L (ref 3.5–5.1)
Sodium: 142 mmol/L (ref 135–145)
Total Bilirubin: 0.6 mg/dL (ref 0.0–1.2)
Total Protein: 6.5 g/dL (ref 6.5–8.1)

## 2023-02-17 LAB — CBC WITH DIFFERENTIAL (CANCER CENTER ONLY)
Abs Immature Granulocytes: 0.41 10*3/uL — ABNORMAL HIGH (ref 0.00–0.07)
Basophils Absolute: 0 10*3/uL (ref 0.0–0.1)
Basophils Relative: 1 %
Eosinophils Absolute: 0 10*3/uL (ref 0.0–0.5)
Eosinophils Relative: 1 %
HCT: 42.2 % (ref 39.0–52.0)
Hemoglobin: 13.6 g/dL (ref 13.0–17.0)
Immature Granulocytes: 7 %
Lymphocytes Relative: 17 %
Lymphs Abs: 1.1 10*3/uL (ref 0.7–4.0)
MCH: 30.8 pg (ref 26.0–34.0)
MCHC: 32.2 g/dL (ref 30.0–36.0)
MCV: 95.5 fL (ref 80.0–100.0)
Monocytes Absolute: 0.4 10*3/uL (ref 0.1–1.0)
Monocytes Relative: 7 %
Neutro Abs: 4.2 10*3/uL (ref 1.7–7.7)
Neutrophils Relative %: 67 %
Platelet Count: 103 10*3/uL — ABNORMAL LOW (ref 150–400)
RBC: 4.42 MIL/uL (ref 4.22–5.81)
RDW: 13.2 % (ref 11.5–15.5)
Smear Review: NORMAL
WBC Count: 6.2 10*3/uL (ref 4.0–10.5)
nRBC: 0 % (ref 0.0–0.2)

## 2023-02-17 LAB — URINALYSIS, COMPLETE (UACMP) WITH MICROSCOPIC
Bilirubin Urine: NEGATIVE
Glucose, UA: NEGATIVE mg/dL
Hgb urine dipstick: NEGATIVE
Ketones, ur: NEGATIVE mg/dL
Leukocytes,Ua: NEGATIVE
Nitrite: NEGATIVE
Protein, ur: NEGATIVE mg/dL
Specific Gravity, Urine: 1.02 (ref 1.005–1.030)
pH: 6 (ref 5.0–8.0)

## 2023-02-17 LAB — SAMPLE TO BLOOD BANK

## 2023-02-17 LAB — LACTATE DEHYDROGENASE: LDH: 222 U/L — ABNORMAL HIGH (ref 98–192)

## 2023-02-17 LAB — FERRITIN: Ferritin: 18 ng/mL — ABNORMAL LOW (ref 24–336)

## 2023-02-17 LAB — RETICULOCYTES
Immature Retic Fract: 20.8 % — ABNORMAL HIGH (ref 2.3–15.9)
RBC.: 4.39 MIL/uL (ref 4.22–5.81)
Retic Count, Absolute: 104 10*3/uL (ref 19.0–186.0)
Retic Ct Pct: 2.4 % (ref 0.4–3.1)

## 2023-02-17 NOTE — Progress Notes (Signed)
Hematology and Oncology Follow Up Visit  Jason Abbott [id number] October 08, 1945 78 y.o. 02/17/2023   Principle Diagnosis:  Stage C CLL -autoimmune hemolytic anemia  Current Therapy:   Prednisone 80 mg p.o. daily-started on 08/20/2022 --decrease to 40 mg p.o. daily on 09/04/2022 Prednisone 10 mg p.o. daily Rituxan/Bendamustine-start treatment on 11/20/2022 -status post cycle 1-DC on 12/18/2022 due to poor tolerance   Interim History:  Jason Abbott is here today for follow-up earlier than expected.   He is here with his wife. They report that for the past few days he has felt fatigued and has not had much of an appetite. Normally when this happens his blood counts are off. They called in and were asked to come in for blood work and evaluation.   They do report that today he has improved a bit in terms of appetite and fatigue.   There has been no bleeding to his knowledge: denies epistaxis, gingivitis, hemoptysis, hematemesis, hematuria, melena, excessive bruising, blood donation.    He has not noted any fever.  No urinary symptoms- though he does have a history of many UTIs.   He had an echocardiogram done recently that showed an ejection fraction of 50-55%.  Wt Readings from Last 3 Encounters:  02/17/23 197 lb 12.8 oz (89.7 kg)  02/06/23 195 lb 12.8 oz (88.8 kg)  01/08/23 194 lb 12.8 oz (88.4 kg)    Overall, I would say that since probably, 1.    Medications:  Allergies as of 02/17/2023       Reactions   Levaquin [levofloxacin] Other (See Comments)   Due to medical hx        Medication List        Accurate as of February 17, 2023  2:47 PM. If you have any questions, ask your nurse or doctor.          aspirin EC 81 MG tablet Take 1 tablet (81 mg total) by mouth daily at 6 (six) AM. Swallow whole.   AZO CRANBERRY PO Take 1 each by mouth in the morning.   cephALEXin 250 MG capsule Commonly known as: KEFLEX Take 1 capsule (250 mg total) by mouth daily.    doxylamine (Sleep) 25 MG tablet Commonly known as: UNISOM Take 25 mg by mouth at bedtime as needed.   famciclovir 250 MG tablet Commonly known as: FAMVIR TAKE 1 TABLET BY MOUTH TWICE A DAY   finasteride 5 MG tablet Commonly known as: PROSCAR Take 1 tablet (5 mg total) by mouth daily.   fluconazole 100 MG tablet Commonly known as: DIFLUCAN Take 1 tablet (100 mg total) by mouth daily.   folic acid 1 MG tablet Commonly known as: FOLVITE Take 2 tablets (2 mg total) by mouth daily.   MULTIVITAMIN GUMMIES ADULTS PO Take 2 tablets by mouth in the morning.   ondansetron 8 MG tablet Commonly known as: ZOFRAN Take 1 tablet (8 mg total) by mouth every 8 (eight) hours as needed for nausea or vomiting. Start on the 3rd day after chemotherapy.   predniSONE 10 MG tablet Commonly known as: DELTASONE Take 2 tablets (20 mg total) by mouth daily with breakfast. What changed: how much to take   prochlorperazine 10 MG tablet Commonly known as: COMPAZINE Take 1 tablet (10 mg total) by mouth every 6 (six) hours as needed for nausea or vomiting.   tamsulosin 0.4 MG Caps capsule Commonly known as: FLOMAX Take 1 capsule (0.4 mg total) by mouth in the morning and at  bedtime.   VITAMIN B 12 PO Take 1 tablet by mouth in the morning.        Allergies:  Allergies  Allergen Reactions   Levaquin [Levofloxacin] Other (See Comments)    Due to medical hx    Past Medical History, Surgical history, Social history, and Family History were reviewed and updated.  Review of Systems: Review of Systems  Constitutional:  Positive for malaise/fatigue.  HENT: Negative.    Eyes: Negative.   Respiratory: Negative.    Cardiovascular: Negative.   Gastrointestinal: Negative.   Genitourinary: Negative.   Musculoskeletal: Negative.   Skin: Negative.   Neurological: Negative.   Endo/Heme/Allergies: Negative.   Psychiatric/Behavioral: Negative.       Physical Exam:  height is 5\' 10"  (1.778 m) and  weight is 197 lb 12.8 oz (89.7 kg). His oral temperature is 97.9 F (36.6 C). His blood pressure is 132/91 (abnormal) and his pulse is 95. His respiration is 18 and oxygen saturation is 98%.   Wt Readings from Last 3 Encounters:  02/17/23 197 lb 12.8 oz (89.7 kg)  02/06/23 195 lb 12.8 oz (88.8 kg)  01/08/23 194 lb 12.8 oz (88.4 kg)    Physical Exam Vitals reviewed.  HENT:     Head: Normocephalic and atraumatic.  Eyes:     Pupils: Pupils are equal, round, and reactive to light.  Cardiovascular:     Rate and Rhythm: Normal rate and regular rhythm.     Heart sounds: Normal heart sounds.  Pulmonary:     Effort: Pulmonary effort is normal.     Breath sounds: Normal breath sounds.  Abdominal:     General: Bowel sounds are normal.     Palpations: Abdomen is soft.  Musculoskeletal:        General: No tenderness or deformity. Normal range of motion.     Cervical back: Normal range of motion.  Lymphadenopathy:     Cervical: No cervical adenopathy.  Skin:    General: Skin is warm and dry.     Findings: No erythema or rash.  Neurological:     Mental Status: He is alert and oriented to person, place, and time.  Psychiatric:        Behavior: Behavior normal.        Thought Content: Thought content normal.        Judgment: Judgment normal.      Lab Results  Component Value Date   WBC 6.2 02/17/2023   HGB 13.6 02/17/2023   HCT 42.2 02/17/2023   MCV 95.5 02/17/2023   PLT 103 (L) 02/17/2023   Lab Results  Component Value Date   FERRITIN 41 11/28/2022   IRON 84 11/28/2022   TIBC 326 11/28/2022   UIBC 242 11/28/2022   IRONPCTSAT 26 11/28/2022   Lab Results  Component Value Date   RETICCTPCT 2.4 02/17/2023   RBC 4.42 02/17/2023   RBC 4.39 02/17/2023   Lab Results  Component Value Date   KPAFRELGTCHN 12.8 08/26/2021   LAMBDASER 24.9 08/26/2021   KAPLAMBRATIO 0.51 08/26/2021   Lab Results  Component Value Date   IGGSERUM 808 08/26/2021   IGA 149 08/26/2021    IGMSERUM 39 08/26/2021   No results found for: "TOTALPROTELP", "ALBUMINELP", "A1GS", "A2GS", "BETS", "BETA2SER", "GAMS", "MSPIKE", "SPEI"   Chemistry      Component Value Date/Time   NA 142 02/17/2023 1343   NA 142 11/05/2022 1127   K 4.4 02/17/2023 1343   CL 102 02/17/2023 1343   CO2 31  02/17/2023 1343   BUN 20 02/17/2023 1343   BUN 18 11/05/2022 1127   CREATININE 1.11 02/17/2023 1343   CREATININE 1.03 08/15/2021 0000      Component Value Date/Time   CALCIUM 9.6 02/17/2023 1343   ALKPHOS 53 02/17/2023 1343   AST 14 (L) 02/17/2023 1343   ALT 12 02/17/2023 1343   BILITOT 0.6 02/17/2023 1343      Encounter Diagnoses  Name Primary?   CLL (chronic lymphocytic leukemia) (HCC) Yes   Thrombocytopenia (HCC)    Autoimmune hemolytic anemia (HCC)     Impression and Plan: Mr. Kistler is a very pleasant 78 yo caucasian gentleman with stage C CLL. He developed autoimmune hemolytic anemia.  We started him on prednisone which he has tolerated well.   Today his CBC looks stable which is reassuring.  UA pending. He is afebrile. No leukocytosis.  RTC as previously planned in March.    Rushie Chestnut, PA-C 2/11/20252:47 PM

## 2023-02-18 LAB — IRON AND IRON BINDING CAPACITY (CC-WL,HP ONLY)
Iron: 74 ug/dL (ref 45–182)
Saturation Ratios: 18 % (ref 17.9–39.5)
TIBC: 419 ug/dL (ref 250–450)
UIBC: 345 ug/dL (ref 117–376)

## 2023-02-18 LAB — URINE CULTURE: Culture: NO GROWTH

## 2023-04-03 ENCOUNTER — Encounter (HOSPITAL_BASED_OUTPATIENT_CLINIC_OR_DEPARTMENT_OTHER): Payer: Self-pay | Admitting: Emergency Medicine

## 2023-04-03 ENCOUNTER — Other Ambulatory Visit: Payer: Self-pay

## 2023-04-03 ENCOUNTER — Inpatient Hospital Stay (HOSPITAL_BASED_OUTPATIENT_CLINIC_OR_DEPARTMENT_OTHER): Payer: Medicare Other | Admitting: Hematology & Oncology

## 2023-04-03 ENCOUNTER — Inpatient Hospital Stay: Payer: Medicare Other | Attending: Hematology & Oncology

## 2023-04-03 ENCOUNTER — Emergency Department (HOSPITAL_BASED_OUTPATIENT_CLINIC_OR_DEPARTMENT_OTHER)

## 2023-04-03 ENCOUNTER — Inpatient Hospital Stay (HOSPITAL_BASED_OUTPATIENT_CLINIC_OR_DEPARTMENT_OTHER)
Admission: EM | Admit: 2023-04-03 | Discharge: 2023-04-06 | DRG: 309 | Disposition: A | Discharge status: Home / Self Care | Attending: Family Medicine | Admitting: Family Medicine

## 2023-04-03 ENCOUNTER — Encounter: Payer: Self-pay | Admitting: Hematology & Oncology

## 2023-04-03 VITALS — BP 167/86 | HR 56 | Temp 97.9°F | Resp 19 | Ht 70.0 in | Wt 200.0 lb

## 2023-04-03 DIAGNOSIS — D509 Iron deficiency anemia, unspecified: Secondary | ICD-10-CM

## 2023-04-03 DIAGNOSIS — Z7901 Long term (current) use of anticoagulants: Secondary | ICD-10-CM

## 2023-04-03 DIAGNOSIS — D591 Autoimmune hemolytic anemia, unspecified: Secondary | ICD-10-CM | POA: Diagnosis present

## 2023-04-03 DIAGNOSIS — K279 Peptic ulcer, site unspecified, unspecified as acute or chronic, without hemorrhage or perforation: Secondary | ICD-10-CM | POA: Diagnosis not present

## 2023-04-03 DIAGNOSIS — Z8679 Personal history of other diseases of the circulatory system: Secondary | ICD-10-CM

## 2023-04-03 DIAGNOSIS — Z8673 Personal history of transient ischemic attack (TIA), and cerebral infarction without residual deficits: Secondary | ICD-10-CM

## 2023-04-03 DIAGNOSIS — E782 Mixed hyperlipidemia: Secondary | ICD-10-CM | POA: Diagnosis not present

## 2023-04-03 DIAGNOSIS — I4891 Unspecified atrial fibrillation: Secondary | ICD-10-CM | POA: Insufficient documentation

## 2023-04-03 DIAGNOSIS — Z8711 Personal history of peptic ulcer disease: Secondary | ICD-10-CM | POA: Diagnosis not present

## 2023-04-03 DIAGNOSIS — I10 Essential (primary) hypertension: Secondary | ICD-10-CM | POA: Diagnosis not present

## 2023-04-03 DIAGNOSIS — D6959 Other secondary thrombocytopenia: Secondary | ICD-10-CM | POA: Diagnosis not present

## 2023-04-03 DIAGNOSIS — I5022 Chronic systolic (congestive) heart failure: Secondary | ICD-10-CM | POA: Diagnosis not present

## 2023-04-03 DIAGNOSIS — I110 Hypertensive heart disease with heart failure: Secondary | ICD-10-CM | POA: Diagnosis not present

## 2023-04-03 DIAGNOSIS — I7121 Aneurysm of the ascending aorta, without rupture: Secondary | ICD-10-CM | POA: Diagnosis present

## 2023-04-03 DIAGNOSIS — Z7982 Long term (current) use of aspirin: Secondary | ICD-10-CM | POA: Diagnosis not present

## 2023-04-03 DIAGNOSIS — K219 Gastro-esophageal reflux disease without esophagitis: Secondary | ICD-10-CM | POA: Diagnosis not present

## 2023-04-03 DIAGNOSIS — Z825 Family history of asthma and other chronic lower respiratory diseases: Secondary | ICD-10-CM

## 2023-04-03 DIAGNOSIS — I480 Paroxysmal atrial fibrillation: Secondary | ICD-10-CM | POA: Diagnosis not present

## 2023-04-03 DIAGNOSIS — Z833 Family history of diabetes mellitus: Secondary | ICD-10-CM | POA: Diagnosis not present

## 2023-04-03 DIAGNOSIS — C9110 Chronic lymphocytic leukemia of B-cell type not having achieved remission: Secondary | ICD-10-CM

## 2023-04-03 DIAGNOSIS — Z7952 Long term (current) use of systemic steroids: Secondary | ICD-10-CM | POA: Insufficient documentation

## 2023-04-03 DIAGNOSIS — Z79899 Other long term (current) drug therapy: Secondary | ICD-10-CM

## 2023-04-03 DIAGNOSIS — Z95828 Presence of other vascular implants and grafts: Secondary | ICD-10-CM | POA: Diagnosis not present

## 2023-04-03 DIAGNOSIS — I361 Nonrheumatic tricuspid (valve) insufficiency: Secondary | ICD-10-CM | POA: Diagnosis not present

## 2023-04-03 DIAGNOSIS — D6869 Other thrombophilia: Secondary | ICD-10-CM | POA: Diagnosis not present

## 2023-04-03 DIAGNOSIS — Z8616 Personal history of COVID-19: Secondary | ICD-10-CM | POA: Diagnosis not present

## 2023-04-03 DIAGNOSIS — Z87891 Personal history of nicotine dependence: Secondary | ICD-10-CM

## 2023-04-03 DIAGNOSIS — N400 Enlarged prostate without lower urinary tract symptoms: Secondary | ICD-10-CM | POA: Diagnosis not present

## 2023-04-03 DIAGNOSIS — I2510 Atherosclerotic heart disease of native coronary artery without angina pectoris: Secondary | ICD-10-CM | POA: Diagnosis not present

## 2023-04-03 HISTORY — DX: Unspecified atrial fibrillation: I48.91

## 2023-04-03 LAB — CBC WITH DIFFERENTIAL (CANCER CENTER ONLY)
Abs Immature Granulocytes: 0.14 10*3/uL — ABNORMAL HIGH (ref 0.00–0.07)
Basophils Absolute: 0.1 10*3/uL (ref 0.0–0.1)
Basophils Relative: 1 %
Eosinophils Absolute: 0.1 10*3/uL (ref 0.0–0.5)
Eosinophils Relative: 1 %
HCT: 45.4 % (ref 39.0–52.0)
Hemoglobin: 15 g/dL (ref 13.0–17.0)
Immature Granulocytes: 1 %
Lymphocytes Relative: 22 %
Lymphs Abs: 2.1 10*3/uL (ref 0.7–4.0)
MCH: 31.8 pg (ref 26.0–34.0)
MCHC: 33 g/dL (ref 30.0–36.0)
MCV: 96.2 fL (ref 80.0–100.0)
Monocytes Absolute: 0.5 10*3/uL (ref 0.1–1.0)
Monocytes Relative: 5 %
Neutro Abs: 6.9 10*3/uL (ref 1.7–7.7)
Neutrophils Relative %: 70 %
Platelet Count: 97 10*3/uL — ABNORMAL LOW (ref 150–400)
RBC: 4.72 MIL/uL (ref 4.22–5.81)
RDW: 17.1 % — ABNORMAL HIGH (ref 11.5–15.5)
WBC Count: 9.7 10*3/uL (ref 4.0–10.5)
nRBC: 0 % (ref 0.0–0.2)

## 2023-04-03 LAB — CMP (CANCER CENTER ONLY)
ALT: 15 U/L (ref 0–44)
AST: 16 U/L (ref 15–41)
Albumin: 4.6 g/dL (ref 3.5–5.0)
Alkaline Phosphatase: 58 U/L (ref 38–126)
Anion gap: 9 (ref 5–15)
BUN: 20 mg/dL (ref 8–23)
CO2: 31 mmol/L (ref 22–32)
Calcium: 9.8 mg/dL (ref 8.9–10.3)
Chloride: 101 mmol/L (ref 98–111)
Creatinine: 1.21 mg/dL (ref 0.61–1.24)
GFR, Estimated: >60 mL/min (ref >60)
Glucose, Bld: 120 mg/dL — ABNORMAL HIGH (ref 70–99)
Potassium: 4.3 mmol/L (ref 3.5–5.1)
Sodium: 141 mmol/L (ref 135–145)
Total Bilirubin: 0.6 mg/dL (ref 0.0–1.2)
Total Protein: 6.3 g/dL — ABNORMAL LOW (ref 6.5–8.1)

## 2023-04-03 LAB — CBC
HCT: 42.6 % (ref 39.0–52.0)
Hemoglobin: 14.2 g/dL (ref 13.0–17.0)
MCH: 31.3 pg (ref 26.0–34.0)
MCHC: 33.3 g/dL (ref 30.0–36.0)
MCV: 94 fL (ref 80.0–100.0)
Platelets: 93 10*3/uL — ABNORMAL LOW (ref 150–400)
RBC: 4.53 MIL/uL (ref 4.22–5.81)
RDW: 17.1 % — ABNORMAL HIGH (ref 11.5–15.5)
WBC: 7.6 10*3/uL (ref 4.0–10.5)
nRBC: 0 % (ref 0.0–0.2)

## 2023-04-03 LAB — BASIC METABOLIC PANEL WITH GFR
Anion gap: 8 (ref 5–15)
BUN: 21 mg/dL (ref 8–23)
CO2: 29 mmol/L (ref 22–32)
Calcium: 9.5 mg/dL (ref 8.9–10.3)
Chloride: 102 mmol/L (ref 98–111)
Creatinine, Ser: 1.05 mg/dL (ref 0.61–1.24)
GFR, Estimated: >60 mL/min (ref >60)
Glucose, Bld: 129 mg/dL — ABNORMAL HIGH (ref 70–99)
Potassium: 4.3 mmol/L (ref 3.5–5.1)
Sodium: 139 mmol/L (ref 135–145)

## 2023-04-03 LAB — RETICULOCYTES
Immature Retic Fract: 22.7 % — ABNORMAL HIGH (ref 2.3–15.9)
RBC.: 4.73 MIL/uL (ref 4.22–5.81)
Retic Count, Absolute: 126.8 10*3/uL (ref 19.0–186.0)
Retic Ct Pct: 2.7 % (ref 0.4–3.1)

## 2023-04-03 LAB — LACTATE DEHYDROGENASE: LDH: 213 U/L — ABNORMAL HIGH (ref 98–192)

## 2023-04-03 MED ORDER — ONDANSETRON HCL 4 MG/2ML IJ SOLN: 4.0000 mg | Freq: Four times a day (QID) | INTRAMUSCULAR | Status: DC | PRN

## 2023-04-03 MED ORDER — HEPARIN BOLUS VIA INFUSION
2000.0000 [IU] | Freq: Once | INTRAVENOUS | Status: AC
  Administered 2023-04-03: 2000 [IU] via INTRAVENOUS

## 2023-04-03 MED ORDER — ACETAMINOPHEN 325 MG PO TABS
650.0000 mg | ORAL_TABLET | ORAL | Status: DC | PRN
  Administered 2023-04-04: 650 mg via ORAL
  Filled 2023-04-03: qty 2

## 2023-04-03 MED ORDER — DILTIAZEM HCL 25 MG/5ML IV SOLN
10.0000 mg | Freq: Once | INTRAVENOUS | Status: AC
  Administered 2023-04-03: 10 mg via INTRAVENOUS
  Filled 2023-04-03: qty 5

## 2023-04-03 MED ORDER — HEPARIN (PORCINE) 25000 UT/250ML-% IV SOLN
1450.0000 [IU]/h | INTRAVENOUS | Status: DC
  Administered 2023-04-03: 1250 [IU]/h via INTRAVENOUS
  Filled 2023-04-03 (×2): qty 250

## 2023-04-03 MED ORDER — FINASTERIDE 5 MG PO TABS
5.0000 mg | ORAL_TABLET | Freq: Every day | ORAL | Status: DC
Start: 2023-04-04 — End: 2023-04-06
  Administered 2023-04-04 – 2023-04-06 (×3): 5 mg via ORAL
  Filled 2023-04-03 (×3): qty 1

## 2023-04-03 MED ORDER — TAMSULOSIN HCL 0.4 MG PO CAPS: 0.4000 mg | ORAL_CAPSULE | Freq: Every day | ORAL | Status: DC

## 2023-04-03 MED ORDER — DILTIAZEM HCL-DEXTROSE 125-5 MG/125ML-% IV SOLN (PREMIX)
5.0000 mg/h | INTRAVENOUS | Status: AC
  Administered 2023-04-03 – 2023-04-04 (×2): 5 mg/h via INTRAVENOUS
  Filled 2023-04-03 (×2): qty 125

## 2023-04-03 NOTE — ED Notes (Signed)
 Assesed Cardizem drip and no titration is needed at this time. HR 90s

## 2023-04-03 NOTE — ED Triage Notes (Signed)
 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

## 2023-04-03 NOTE — Progress Notes (Signed)
 PHARMACY - ANTICOAGULATION CONSULT NOTE  Pharmacy Consult for heparin  Indication: atrial fibrillation  Allergies  Allergen Reactions   Levaquin [Levofloxacin] Other (See Comments)    Due to medical hx    Patient Measurements: Height: 5\' 10"  (177.8 cm) Weight: 90.7 kg (199 lb 15.3 oz) IBW/kg (Calculated) : 73 HEPARIN DW (KG): 90.7  Vital Signs: Temp: 97.9 F (36.6 C) (03/28 1800) Temp Source: Oral (03/28 1300) BP: 134/82 (03/28 2000) Pulse Rate: 99 (03/28 2000)  Labs: Recent Labs    04/03/23 1332 04/03/23 1545  HGB 15.0 14.2  HCT 45.4 42.6  PLT 97* 93*  CREATININE 1.21 1.05    Estimated Creatinine Clearance: 64.6 mL/min (by C-G formula based on SCr of 1.05 mg/dL).   Medical History: Past Medical History:  Diagnosis Date   Aberrant right subclavian artery    Aortic arch aneurysm (HCC)    Arthritis    Atrial fibrillation with rapid ventricular response (HCC) 04/03/2023   Bowel obstruction (HCC)    CLL (chronic lymphocytic leukemia) (HCC)    COVID 2021   hospitalized for it   Diverticul disease small and large intestine, no perforati or abscess    GERD (gastroesophageal reflux disease)    uses tums as needed   H/O urinary infection    Pneumonia 09/22/2021   Shingles outbreak 10/03/2013   Stroke (HCC) 01/2019    Assessment: Patient admitted with CC of Afib w/ RVR. Not on anticoagulation PTA. HgB 14.2 and PLTs 93. Appears thrombocytopenia is chronic with CBC on 2/11 and PLT of 103. Pharmacy consulted to dose heparin gtt.   Goal of Therapy:  Heparin level 0.3-0.7 units/ml Monitor platelets by anticoagulation protocol: Yes   Plan:  Give 2000 units bolus x 1 - lower bolus given thrombocytopenia.  Start heparin infusion at 1250 units/hr Check anti-Xa level in 8 hours and daily while on heparin Continue to monitor H&H and platelets  Estill Batten, PharmD, BCCCP  04/03/2023,8:12 PM

## 2023-04-03 NOTE — ED Provider Notes (Addendum)
 [place] EMERGENCY DEPARTMENT AT [facility] [place] Provider Note   CSN: [id number] Arrival date & time: 04/03/23  1444     History  No chief complaint on file.   Jason Abbott. is a 78 y.o. male.  Patient had an appointment with Dr. 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  Patient currently only being treated with steroids.  Patient is on prednisone.  Patient not on any blood thinners.  Does have a history of atrial fibs in the past but has never been formally treated but at 1 point in time he was on a blood thinner.  Not followed by cardiology.  Patient does state that there has been some exertional shortness of breath now for several months following the sleep that was placed in his thoracic aorta with an endovascular stent that was done in January 2023.  And also has in his past record TEE without cardioversion in January 2024.  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  Patient is a former smoker quit in 1989.  Patient without any leg swelling.  Patient without any chest pain.  Patient not able to tell that his heart rate is fast or irregular.       Home Medications Prior to Admission medications   Medication Sig Start Date End Date Taking? Authorizing Provider  aspirin EC 81 MG tablet Take 1 tablet (81 mg total) by mouth daily at 6 (six) AM. Swallow whole. 12/20/21   Schuh, McKenzi P, PA-C  cephALEXin (KEFLEX) 250 MG capsule Take 1 capsule (250 mg total) by mouth daily. 12/09/22   Joline Maxcy, MD  Cranberry-Vitamin C-Probiotic (AZO CRANBERRY PO) Take 1 each by mouth in the morning.    [provider]  Cyanocobalamin (VITAMIN B 12 PO) Take 1 tablet by mouth in the morning.    [provider]  doxylamine, Sleep, (UNISOM) 25 MG tablet Take 25 mg by mouth at bedtime as needed.    [provider]  famciclovir (FAMVIR) 250 MG tablet TAKE 1 TABLET BY MOUTH TWICE A DAY 01/31/23    Josph Macho, MD  finasteride (PROSCAR) 5 MG tablet Take 1 tablet (5 mg total) by mouth daily. 08/08/22   Joline Maxcy, MD  fluconazole (DIFLUCAN) 100 MG tablet Take 1 tablet (100 mg total) by mouth daily. 01/05/23   Josph Macho, MD  folic acid (FOLVITE) 1 MG tablet Take 2 tablets (2 mg total) by mouth daily. 09/04/22   Josph Macho, MD  Multiple Vitamins-Minerals (MULTIVITAMIN GUMMIES ADULTS PO) Take 2 tablets by mouth in the morning.    [provider]  predniSONE (DELTASONE) 10 MG tablet Take 2 tablets (20 mg total) by mouth daily with breakfast. Patient taking differently: Take 10 mg by mouth daily with breakfast. 12/18/22   Josph Macho, MD  tamsulosin (FLOMAX) 0.4 MG CAPS capsule Take 1 capsule (0.4 mg total) by mouth in the morning and at bedtime. 07/28/22   Donnita Falls, FNP      Allergies    Levaquin [levofloxacin]    Review of Systems   Review of Systems  Constitutional:  Negative for chills and fever.  HENT:  Negative for ear pain and sore throat.   Eyes:  Negative for pain and visual disturbance.  Respiratory:  Positive for shortness of breath. Negative for cough.   Cardiovascular:  Positive for palpitations. Negative for chest pain.  Gastrointestinal:  Negative  for abdominal pain and vomiting.  Genitourinary:  Negative for dysuria and hematuria.  Musculoskeletal:  Negative for arthralgias and back pain.  Skin:  Negative for color change and rash.  Neurological:  Negative for seizures and syncope.  All other systems reviewed and are negative.   Physical Exam Updated Vital Signs BP (!) 136/105 (BP Location: Right Arm)   Pulse (!) 116   Temp 97.9 F (36.6 C)   Resp 16   Ht 1.778 m (5\' 10" )   Wt 90.7 kg   SpO2 95%   BMI 28.69 kg/m  Physical Exam Vitals and nursing note reviewed.  Constitutional:      General: He is not in acute distress.    Appearance: Normal appearance. He is well-developed.  HENT:     Head: Normocephalic and  atraumatic.  Eyes:     Extraocular Movements: Extraocular movements intact.     Conjunctiva/sclera: Conjunctivae normal.     Pupils: Pupils are equal, round, and reactive to light.  Cardiovascular:     Rate and Rhythm: Tachycardia present. Rhythm irregular.     Heart sounds: No murmur heard. Pulmonary:     Effort: Pulmonary effort is normal. No respiratory distress.     Breath sounds: Normal breath sounds.  Abdominal:     Palpations: Abdomen is soft.     Tenderness: There is no abdominal tenderness.  Musculoskeletal:        General: No swelling.     Cervical back: Neck supple.     Right lower leg: No edema.     Left lower leg: No edema.  Skin:    General: Skin is warm and dry.     Capillary Refill: Capillary refill takes less than 2 seconds.  Neurological:     General: No focal deficit present.     Mental Status: He is alert and oriented to person, place, and time.  Psychiatric:        Mood and Affect: Mood normal.     ED Results / Procedures / Treatments   Labs (all labs ordered are listed, but only abnormal results are displayed) Labs Reviewed  BASIC METABOLIC PANEL WITH GFR  CBC    EKG EKG Interpretation Date/Time:  Friday April 03 2023 14:58:06 EDT Ventricular Rate:  127 PR Interval:    QRS Duration:  132 QT Interval:  290 QTC Calculation: 421 R Axis:   265  Text Interpretation: Atrial fibrillation with rapid ventricular response Right bundle branch block Possible Lateral infarct , age undetermined Abnormal ECG When compared with ECG of 03-Apr-2023 14:24, PREVIOUS ECG IS PRESENT Hx of Atrial Fib Confirmed by Vanetta Mulders [phone]) on 04/03/2023 3:20:24 PM  Radiology No results found.  Procedures Procedures    Medications Ordered in ED Medications  diltiazem (CARDIZEM) injection 10 mg (has no administration in time range)  diltiazem (CARDIZEM) 125 mg in dextrose 5% 125 mL (1 mg/mL) infusion (has no administration in time range)    ED Course/ Medical  Decision Making/ A&P                                 Medical Decision Making Amount and/or Complexity of Data Reviewed Labs: ordered. Radiology: ordered.  Risk Prescription drug management. Decision regarding hospitalization.   CRITICAL CARE Performed by: Vanetta Mulders Total critical care time: 45 minutes Critical care time was exclusive of separately billable procedures and treating other patients. Critical care was necessary to treat or  prevent imminent or life-threatening deterioration. Critical care was time spent personally by me on the following activities: development of treatment plan with patient and/or surrogate as well as nursing, discussions with consultants, evaluation of patient's response to treatment, examination of patient, obtaining history from patient or surrogate, ordering and performing treatments and interventions, ordering and review of laboratory studies, ordering and review of radiographic studies, pulse oximetry and re-evaluation of patient's condition.  Patient not on a blood thinner.  Patient also not certain how long he has been in this rhythm.  It was determined incidentally during his visit with Dr. Myna Hidalgo.  Patient has a history of chronic lymphocytic leukemia and is just on prednisone for that.  Patient is EKG shows atrial fibs with RVR heart rate going anywhere from upper 20s to the 130s.  Oxygen saturation is excellent 98% on room air.  Blood pressure little bit on the elevated side 151/107.  Will give patient 10 mg bolus of diltiazem and then start diltiazem drip.  Patient will probably need to get started on blood thinner as well.  But we will see how he responses of diltiazem bolus.  Patient also probably is going to require admission patient informed of this.  Patient's CBC is reported above patient's base metabolic panel without any acute abnormalities.  Chest x-ray without any acute findings.  Patient still in atrial for but better rate control.   Will discuss with hospitalist.  And see whether they want to go with heparin or something else for anticoagulation.  Final Clinical Impression(s) / ED Diagnoses Final diagnoses:  Atrial fibrillation with RVR [facility])    Rx / DC Orders ED Discharge Orders     None         Vanetta Mulders, MD 04/03/23 1547    Vanetta Mulders, MD 04/03/23 [phone]

## 2023-04-03 NOTE — Plan of Care (Addendum)
 Plan of Care Note for accepted transfer  Patient: Jason Abbott.              ZOX:[id number]  DOA: 04/03/2023     [facility] transfer[facility] Requesting Provider: Dr. Deretha Emory  Reason for transfer: New onset of A-fib RVR   ED triage note:Pt was at appointment upstairs w CA MD, EKG was done and pt is in afib with RVR, denies any CP or [facility]; hx of aortic arch repair in Jan 2024, had afib for a short period after that, denies blood thinner   Facility course: 78 year old man history of paroxysmal atrial fibrillation during endovascular repair of thoracic aneurysm in 01/2022 was anticoagulation with Eliquis for 1 month after that it has been discontinued unclear etiology, CLL, chronic thrombocytopenia, autoimmune hemolytic anemia, GERD, CVA, 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  Even though the patient has previous history of atrial fibrillation he never been followed with cardiology clinic or it is not clear who has been discontinued Eliquis in the past.  Patient had an appointment with Dr. 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  Patient currently only being treated with prednisone.  Patient not on any blood thinners.  Does have a history of atrial fibs in the past but has never been formally treated but at 1 point in time he was on a blood thinner.  Not followed by cardiology.  Patient does state that there has been some exertional shortness of breath now for several months following the sleep that was placed in his thoracic aorta with an endovascular stent that was done in January 2023.  And also has in his past record TEE without cardioversion in January 2024.  Patient had a carotid subclavian bypass graft in 2023 aortic arch deep branching in  January 2024.  Patient is a former smoker quit in 1989.  Patient without any leg swelling.  Patient without any chest pain.  Patient not able to tell that his heart rate is fast or irregular.    Presentation to ED patient found to have a heart rate 107.  Otherwise hemodynamically stable. EKG showing A-fib RVR heart rate 127. CBC unremarkable except chronically low platelet count 93. BMP unremarkable for any electrolyte change.  Chest x-ray no acute disease process. With the new onset of A-fib RVR in the ED patient has been given Cardizem 10 mg bolus and currently on Cardizem drip.  Hospitalist has been consulted for further evaluation management of new onset of A-fib RVR.  Discussed with Dr.Zackowski regarding anticoagulation.  Patient's CHA2DS2-VASc history score is 5 which placed patient high risk of development of stroke.  Recommending to start IV heparin drip with pharmacy consult.    Plan of care: The patient is accepted for admission for inpatient status to Progressive unit, at [facility] long hospital or [facility].  [facility] will assume care on arrival to accepting facility. Until arrival, care as per EDP. However, [facility] available 24/7 for questions and assistance.   Check [email or web address] for on-call coverage.   Nursing staff, please call [facility] Admits & Consults System-Wide number under Amion on patient's arrival so appropriate admitting provider can evaluate the pt.    Author: Tereasa Coop, MD  04/03/2023  [facility]

## 2023-04-03 NOTE — ED Notes (Signed)
 BMP sent to [facility] per Dr Deretha Emory.

## 2023-04-03 NOTE — Progress Notes (Signed)
 Hematology and Oncology Follow Up Visit  Jason Abbott [id number] 12-20-1945 78 y.o. 04/03/2023   Principle Diagnosis:  Stage C CLL -autoimmune hemolytic anemia  Current Therapy:   Prednisone 80 mg p.o. daily-started on 08/20/2022 --decrease to 40 mg p.o. daily on 09/04/2022 Prednisone 10 mg p.o. daily Rituxan/Bendamustine-start treatment on 11/20/2022 -status post cycle 1-DC on 12/18/2022 due to poor tolerance   Interim History:  Jason Abbott is here today for follow-up.  He actually brought in his accordion to play for the staff and patients.  I am very impressed by this.  However, I did the problem that we have is that when I examined him, his heart rate was quite rapid and irregular.  We did get an EKG on him.  He was in rapid A-fib.  He has never seen a cardiologist.  As such, or pregnant have to get him down to the ER to try to have the A-fib managed.  He has seen vascular surgeon in the past.  He has seen cardiothoracic surgery in the past.  He has had no headache.  He has had no chest pain.  Has had no nausea or vomiting.  He has had no diaphoresis.  He has had no change in bowel or bladder habits.  He has not noted any swollen lymph nodes.  He and his wife were planning on going to [place].  There were a drive up there to see her parents.  He usually takes about 5-6 days.  Currently, I would have said that his performance status is probably ECOG 1.    Wt Readings from Last 3 Encounters:  02/17/23 197 lb 12.8 oz (89.7 kg)  02/06/23 195 lb 12.8 oz (88.8 kg)  01/08/23 194 lb 12.8 oz (88.4 kg)     Medications:  Allergies as of 04/03/2023       Reactions   Levaquin [levofloxacin] Other (See Comments)   Due to medical hx        Medication List        Accurate as of April 03, 2023  1:47 PM. If you have any questions, ask your nurse or doctor.          aspirin EC 81 MG tablet Take 1 tablet (81 mg total) by mouth daily at 6 (six) AM. Swallow whole.    AZO CRANBERRY PO Take 1 each by mouth in the morning.   cephALEXin 250 MG capsule Commonly known as: KEFLEX Take 1 capsule (250 mg total) by mouth daily.   doxylamine (Sleep) 25 MG tablet Commonly known as: UNISOM Take 25 mg by mouth at bedtime as needed.   famciclovir 250 MG tablet Commonly known as: FAMVIR TAKE 1 TABLET BY MOUTH TWICE A DAY   finasteride 5 MG tablet Commonly known as: PROSCAR Take 1 tablet (5 mg total) by mouth daily.   fluconazole 100 MG tablet Commonly known as: DIFLUCAN Take 1 tablet (100 mg total) by mouth daily.   folic acid 1 MG tablet Commonly known as: FOLVITE Take 2 tablets (2 mg total) by mouth daily.   MULTIVITAMIN GUMMIES ADULTS PO Take 2 tablets by mouth in the morning.   ondansetron 8 MG tablet Commonly known as: ZOFRAN Take 1 tablet (8 mg total) by mouth every 8 (eight) hours as needed for nausea or vomiting. Start on the 3rd day after chemotherapy.   predniSONE 10 MG tablet Commonly known as: DELTASONE Take 2 tablets (20 mg total) by mouth daily with breakfast. What changed: how  much to take   prochlorperazine 10 MG tablet Commonly known as: COMPAZINE Take 1 tablet (10 mg total) by mouth every 6 (six) hours as needed for nausea or vomiting.   tamsulosin 0.4 MG Caps capsule Commonly known as: FLOMAX Take 1 capsule (0.4 mg total) by mouth in the morning and at bedtime.   VITAMIN B 12 PO Take 1 tablet by mouth in the morning.        Allergies:  Allergies  Allergen Reactions   Levaquin [Levofloxacin] Other (See Comments)    Due to medical hx    Past Medical History, Surgical history, Social history, and Family History were reviewed and updated.  Review of Systems: Review of Systems  Constitutional:  Positive for malaise/fatigue.  HENT: Negative.    Eyes: Negative.   Respiratory: Negative.    Cardiovascular: Negative.   Gastrointestinal: Negative.   Genitourinary: Negative.   Musculoskeletal: Negative.   Skin:  Negative.   Neurological: Negative.   Endo/Heme/Allergies: Negative.   Psychiatric/Behavioral: Negative.       Physical Exam: Vital signs show temperature of 97.9.  Pulse 118.  Blood pressure 167/86.  Weight is 200 pounds.  Wt Readings from Last 3 Encounters:  02/17/23 197 lb 12.8 oz (89.7 kg)  02/06/23 195 lb 12.8 oz (88.8 kg)  01/08/23 194 lb 12.8 oz (88.4 kg)    Physical Exam Vitals reviewed.  HENT:     Head: Normocephalic and atraumatic.  Eyes:     Pupils: Pupils are equal, round, and reactive to light.  Cardiovascular:     Rate and Rhythm: Normal rate and regular rhythm.     Heart sounds: Normal heart sounds.  Pulmonary:     Effort: Pulmonary effort is normal.     Breath sounds: Normal breath sounds.  Abdominal:     General: Bowel sounds are normal.     Palpations: Abdomen is soft.  Musculoskeletal:        General: No tenderness or deformity. Normal range of motion.     Cervical back: Normal range of motion.  Lymphadenopathy:     Cervical: No cervical adenopathy.  Skin:    General: Skin is warm and dry.     Findings: No erythema or rash.  Neurological:     Mental Status: He is alert and oriented to person, place, and time.  Psychiatric:        Behavior: Behavior normal.        Thought Content: Thought content normal.        Judgment: Judgment normal.      Lab Results  Component Value Date   WBC 6.2 02/17/2023   HGB 13.6 02/17/2023   HCT 42.2 02/17/2023   MCV 95.5 02/17/2023   PLT 103 (L) 02/17/2023   Lab Results  Component Value Date   FERRITIN 18 (L) 02/17/2023   IRON 74 02/17/2023   TIBC 419 02/17/2023   UIBC 345 02/17/2023   IRONPCTSAT 18 02/17/2023   Lab Results  Component Value Date   RETICCTPCT 2.4 02/17/2023   RBC 4.42 02/17/2023   RBC 4.39 02/17/2023   Lab Results  Component Value Date   KPAFRELGTCHN 12.8 08/26/2021   LAMBDASER 24.9 08/26/2021   KAPLAMBRATIO 0.51 08/26/2021   Lab Results  Component Value Date   IGGSERUM 808  08/26/2021   IGA 149 08/26/2021   IGMSERUM 39 08/26/2021   No results found for: "TOTALPROTELP", "ALBUMINELP", "A1GS", "A2GS", "BETS", "BETA2SER", "GAMS", "MSPIKE", "SPEI"   Chemistry      Component Value  Date/Time   NA 142 02/17/2023 1343   NA 142 11/05/2022 1127   K 4.4 02/17/2023 1343   CL 102 02/17/2023 1343   CO2 31 02/17/2023 1343   BUN 20 02/17/2023 1343   BUN 18 11/05/2022 1127   CREATININE 1.11 02/17/2023 1343   CREATININE 1.03 08/15/2021 0000      Component Value Date/Time   CALCIUM 9.6 02/17/2023 1343   ALKPHOS 53 02/17/2023 1343   AST 14 (L) 02/17/2023 1343   ALT 12 02/17/2023 1343   BILITOT 0.6 02/17/2023 1343      Impression and Plan: Jason Abbott is a very pleasant 78 yo caucasian gentleman with stage C CLL. He developed autoimmune hemolytic anemia.  We started him on prednisone which he  tolerated well.   We then gave him 1 cycle of Rituxan/Bendamustine.  He tolerated this incredibly poorly.  Since then, we have just watched him.  I do not see a problem with the CLL.  His white cell count is doing okay.  He does not have anemia.  His reticulocyte count is okay.  His platelet count may be down a little bit but again I do not think this is a problem.  I think the problem right now is the atrial fibrillation.  Again, we will have to come down to the ER.  I will go ahead and likely plan to get him back in a month or so.  I think if the atrial fibrillation gets under control, then he could certainly get out to [place].  I would think that he is going to need anticoagulation.  From my point of view, I do not see a problem with him being anticoagulated.   Josph Macho, MD 3/28/20251:47 PM

## 2023-04-04 ENCOUNTER — Inpatient Hospital Stay (HOSPITAL_COMMUNITY)

## 2023-04-04 DIAGNOSIS — E782 Mixed hyperlipidemia: Secondary | ICD-10-CM

## 2023-04-04 DIAGNOSIS — I4891 Unspecified atrial fibrillation: Secondary | ICD-10-CM | POA: Diagnosis not present

## 2023-04-04 DIAGNOSIS — C9110 Chronic lymphocytic leukemia of B-cell type not having achieved remission: Secondary | ICD-10-CM | POA: Diagnosis not present

## 2023-04-04 DIAGNOSIS — I480 Paroxysmal atrial fibrillation: Secondary | ICD-10-CM | POA: Diagnosis not present

## 2023-04-04 DIAGNOSIS — K279 Peptic ulcer, site unspecified, unspecified as acute or chronic, without hemorrhage or perforation: Secondary | ICD-10-CM

## 2023-04-04 LAB — BASIC METABOLIC PANEL WITH GFR
Anion gap: 10 (ref 5–15)
BUN: 16 mg/dL (ref 8–23)
CO2: 25 mmol/L (ref 22–32)
Calcium: 8.8 mg/dL — ABNORMAL LOW (ref 8.9–10.3)
Chloride: 105 mmol/L (ref 98–111)
Creatinine, Ser: 1.08 mg/dL (ref 0.61–1.24)
GFR, Estimated: >60 mL/min (ref >60)
Glucose, Bld: 98 mg/dL (ref 70–99)
Potassium: 3.8 mmol/L (ref 3.5–5.1)
Sodium: 140 mmol/L (ref 135–145)

## 2023-04-04 LAB — TSH: TSH: 2.372 u[IU]/mL (ref 0.350–4.500)

## 2023-04-04 LAB — ECHOCARDIOGRAM COMPLETE
AR max vel: 3.05 cm2
AV Peak grad: 3.8 mmHg
Ao pk vel: 0.98 m/s
Area-P 1/2: 5.46 cm2
Height: 70 in
S' Lateral: 3.8 cm
Weight: 3185.6 [oz_av]

## 2023-04-04 LAB — CBC
HCT: 42.6 % (ref 39.0–52.0)
Hemoglobin: 14.3 g/dL (ref 13.0–17.0)
MCH: 31.4 pg (ref 26.0–34.0)
MCHC: 33.6 g/dL (ref 30.0–36.0)
MCV: 93.6 fL (ref 80.0–100.0)
Platelets: 92 10*3/uL — ABNORMAL LOW (ref 150–400)
RBC: 4.55 MIL/uL (ref 4.22–5.81)
RDW: 16.9 % — ABNORMAL HIGH (ref 11.5–15.5)
WBC: 8 10*3/uL (ref 4.0–10.5)
nRBC: 0 % (ref 0.0–0.2)

## 2023-04-04 LAB — LIPID PANEL
Cholesterol: 154 mg/dL (ref 0–200)
HDL: 41 mg/dL (ref >40)
LDL Cholesterol: 81 mg/dL (ref 0–99)
Total CHOL/HDL Ratio: 3.8 ratio
Triglycerides: 158 mg/dL — ABNORMAL HIGH (ref <150)
VLDL: 32 mg/dL (ref 0–40)

## 2023-04-04 LAB — HEPARIN LEVEL (UNFRACTIONATED)
Heparin Unfractionated: 0.27 [IU]/mL — ABNORMAL LOW (ref 0.30–0.70)
Heparin Unfractionated: >1.1 [IU]/mL — ABNORMAL HIGH (ref 0.30–0.70)

## 2023-04-04 MED ORDER — DOXYLAMINE SUCCINATE (SLEEP) 25 MG PO TABS
50.0000 mg | ORAL_TABLET | Freq: Every evening | ORAL | Status: DC | PRN
  Filled 2023-04-04 (×2): qty 2

## 2023-04-04 MED ORDER — FLUCONAZOLE 100 MG PO TABS
100.0000 mg | ORAL_TABLET | Freq: Every day | ORAL | Status: DC
  Administered 2023-04-04 – 2023-04-06 (×3): 100 mg via ORAL
  Filled 2023-04-04 (×3): qty 1

## 2023-04-04 MED ORDER — VITAMIN B-12 1000 MCG PO TABS
1000.0000 ug | ORAL_TABLET | Freq: Every day | ORAL | Status: DC
  Administered 2023-04-04 – 2023-04-06 (×3): 1000 ug via ORAL
  Filled 2023-04-04 (×3): qty 1

## 2023-04-04 MED ORDER — APIXABAN 5 MG PO TABS
5.0000 mg | ORAL_TABLET | Freq: Two times a day (BID) | ORAL | Status: DC
  Administered 2023-04-04 – 2023-04-06 (×5): 5 mg via ORAL
  Filled 2023-04-04 (×5): qty 1

## 2023-04-04 MED ORDER — PREDNISONE 10 MG PO TABS
10.0000 mg | ORAL_TABLET | Freq: Every day | ORAL | Status: DC
  Administered 2023-04-04 – 2023-04-06 (×3): 10 mg via ORAL
  Filled 2023-04-04 (×3): qty 1

## 2023-04-04 MED ORDER — FOLIC ACID 1 MG PO TABS
1.0000 mg | ORAL_TABLET | Freq: Two times a day (BID) | ORAL | Status: DC
  Administered 2023-04-04 – 2023-04-06 (×5): 1 mg via ORAL
  Filled 2023-04-04 (×5): qty 1

## 2023-04-04 MED ORDER — FAMCICLOVIR 500 MG PO TABS
250.0000 mg | ORAL_TABLET | Freq: Two times a day (BID) | ORAL | Status: DC
  Administered 2023-04-04 – 2023-04-06 (×5): 250 mg via ORAL
  Filled 2023-04-04 (×6): qty 0.5

## 2023-04-04 MED ORDER — TAMSULOSIN HCL 0.4 MG PO CAPS
0.4000 mg | ORAL_CAPSULE | Freq: Two times a day (BID) | ORAL | Status: DC
Start: 2023-04-04 — End: 2023-04-06
  Administered 2023-04-04 – 2023-04-06 (×5): 0.4 mg via ORAL
  Filled 2023-04-04 (×5): qty 1

## 2023-04-04 MED ORDER — DILTIAZEM HCL 30 MG PO TABS
30.0000 mg | ORAL_TABLET | Freq: Four times a day (QID) | ORAL | Status: DC
  Administered 2023-04-04 – 2023-04-05 (×4): 30 mg via ORAL
  Filled 2023-04-04 (×4): qty 1

## 2023-04-04 NOTE — H&P (Addendum)
 History and Physical    Patient: Jason Abbott. BJY:[id number] DOB: 08-09-1945 DOA: 04/03/2023 DOS: the patient was seen and examined on 04/04/2023 PCP: Monica Becton, MD  Patient coming from: Home  Chief Complaint: No chief complaint on file.  HPI: Jason Abbott. is a 78 y.o. male with medical history significant of Paroxysmal atrial fibrillation, CLL, hyperlipidemia, previous CVA, BPH, peptic ulcer disease who was brought in from med [facility] where he was seen with palpitations and chest discomfort.  Patient went to see his oncologist Dr. 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  Apparently he was noted to have these symptoms at the time. He was subsequently transferred down to the ER.  Patient not on any anticoagulation.  He was seen evaluated and admitted for further evaluation.  Patient currently is only on prednisone taper for his CLL.  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  Patient was previously on Eliquis after his procedure but is for some reason he has been taken off of that now.  He was started on Cardizem drip.  And his rate is now controlled on the drip.  Also started on heparin drip.  Review of Systems: As mentioned in the history of present illness. All other systems reviewed and are negative. Past Medical History:  Diagnosis Date   Aberrant right subclavian artery    Aortic arch aneurysm (HCC)    Arthritis    Atrial fibrillation with rapid ventricular response (HCC) 04/03/2023   Bowel obstruction (HCC)    CLL (chronic lymphocytic leukemia) (HCC)    COVID 2021   hospitalized for it   Diverticul disease small and large intestine, no perforati or abscess    GERD (gastroesophageal reflux disease)    uses tums as needed   H/O urinary infection    Pneumonia 09/22/2021   Shingles  outbreak 10/03/2013   Stroke (HCC) 01/2019   Past Surgical History:  Procedure Laterality Date   AORTIC ARCH DEBRANCHING N/A 01/10/2022   Procedure: AORTIC ARCH DEBRANCHING WITH 16 X 8 X 10 MM HEMASHIELD GOLD GRAFT;  Surgeon: Alleen Borne, MD;  Location: [facility] OR;  Service: Open Heart Surgery;  Laterality: N/A;   CAROTID-SUBCLAVIAN BYPASS GRAFT Right 12/18/2021   Procedure: RIGHT BYPASS GRAFT CAROTID-SUBCLAVIAN;  Surgeon: Nada Libman, MD;  Location: [facility] OR;  Service: Vascular;  Laterality: Right;   CARPAL TUNNEL RELEASE Right 11/25/2021   Procedure: CARPAL TUNNEL RELEASE;  Surgeon: Marlyne Beards, MD;  Location: [facility] OR;  Service: Orthopedics;  Laterality: Right;  or MAC with regional block 60   HERNIA REPAIR     MECKEL DIVERTICULUM EXCISION     ORIF FINGER / THUMB FRACTURE     TEE WITHOUT CARDIOVERSION N/A 01/10/2022   Procedure: TRANSESOPHAGEAL ECHOCARDIOGRAM (TEE);  Surgeon: Alleen Borne, MD;  Location: [facility] OR;  Service: Open Heart Surgery;  Laterality: N/A;   TENOSYNOVECTOMY Right 11/25/2021   Procedure: FLEXOR TENOSYNOVECTOMY;  Surgeon: Marlyne Beards, MD;  Location: [facility] OR;  Service: Orthopedics;  Laterality: Right;  or MAC with regional block 60   THORACIC AORTIC ENDOVASCULAR STENT GRAFT N/A 01/10/2022   Procedure: THORACIC AORTIC ENDOVASCULAR STENT GRAFT;  Surgeon: Nada Libman, MD;  Location: [facility] OR;  Service: Vascular;  Laterality: N/A;   ulna nerve surgery     Social History:  reports that he quit smoking about 36 years ago. His  smoking use included cigarettes. He started smoking about 59 years ago. He has a 11.5 pack-year smoking history. He has never used smokeless tobacco. He reports that he does not currently use alcohol. He reports that he does not use drugs.  Allergies  Allergen Reactions   Levaquin [Levofloxacin] Other (See Comments)    Due to medical hx    Family History  Problem Relation Age of Onset   Cancer Mother        pancreatic   COPD Father     Diabetes Sister    Diabetes Brother    Diabetes Brother     Prior to Admission medications   Medication Sig Start Date End Date Taking? Authorizing Provider  aspirin EC 81 MG tablet Take 1 tablet (81 mg total) by mouth daily at 6 (six) AM. Swallow whole. 12/20/21   Schuh, McKenzi P, PA-C  cephALEXin (KEFLEX) 250 MG capsule Take 1 capsule (250 mg total) by mouth daily. 12/09/22   Joline Maxcy, MD  Cranberry-Vitamin C-Probiotic (AZO CRANBERRY PO) Take 1 each by mouth in the morning.    [provider]  Cyanocobalamin (VITAMIN B 12 PO) Take 1 tablet by mouth in the morning.    [provider]  doxylamine, Sleep, (UNISOM) 25 MG tablet Take 25 mg by mouth at bedtime as needed.    [provider]  famciclovir (FAMVIR) 250 MG tablet TAKE 1 TABLET BY MOUTH TWICE A DAY 01/31/23   Josph Macho, MD  finasteride (PROSCAR) 5 MG tablet Take 1 tablet (5 mg total) by mouth daily. 08/08/22   Joline Maxcy, MD  fluconazole (DIFLUCAN) 100 MG tablet Take 1 tablet (100 mg total) by mouth daily. 01/05/23   Josph Macho, MD  folic acid (FOLVITE) 1 MG tablet Take 2 tablets (2 mg total) by mouth daily. 09/04/22   Josph Macho, MD  Multiple Vitamins-Minerals (MULTIVITAMIN GUMMIES ADULTS PO) Take 2 tablets by mouth in the morning.    [provider]  predniSONE (DELTASONE) 10 MG tablet Take 2 tablets (20 mg total) by mouth daily with breakfast. Patient taking differently: Take 10 mg by mouth daily with breakfast. 12/18/22   Josph Macho, MD  tamsulosin (FLOMAX) 0.4 MG CAPS capsule Take 1 capsule (0.4 mg total) by mouth in the morning and at bedtime. 07/28/22   Donnita Falls, FNP    Physical Exam: Vitals:   04/03/23 2000 04/03/23 2115 04/03/23 2219 04/04/23 0021  BP: 134/82 (!) 138/102 (!) 143/116 (!) 134/91  Pulse: 99 (!) 106 96 70  Resp: (!) 22 12 15 14   Temp:  98.8 F (37.1 C) 98 F (36.7 C) 97.9 F (36.6 C)  TempSrc:  Oral Oral Oral  SpO2: 95% 96% 97%  95%  Weight:   90.3 kg   Height:   5\' 10"  (1.778 m)    Constitutional: Acutely ill looking, NAD, calm, comfortable Eyes: PERRL, lids and conjunctivae normal ENMT: Mucous membranes are moist. Posterior pharynx clear of any exudate or lesions.Normal dentition.  Neck: normal, supple, no masses, no thyromegaly Respiratory: clear to auscultation bilaterally, no wheezing, no crackles. Normal respiratory effort. No accessory muscle use.  Cardiovascular: Irregularly irregular rate and rhythm with mild tachycardia, no murmurs / rubs / gallops. No extremity edema. 2+ pedal pulses. No carotid bruits.  Abdomen: no tenderness, no masses palpated. No hepatosplenomegaly. Bowel sounds positive.  Musculoskeletal: Good range of motion, no joint swelling or tenderness, Skin: no rashes, lesions, ulcers. No induration Neurologic: CN 2-12  grossly intact. Sensation intact, DTR normal. Strength 5/5 in all 4.  Psychiatric: Normal judgment and insight. Alert and oriented x 3. Normal mood  Data Reviewed:  Blood pressure 130/102, pulse 106, glucose 129, LDH 213, platelets 93 chest x-ray showed no acute findings EKG showed A-fib with RVR  Assessment and Plan:  #1 A-fib with RVR: Patient admitted.  Currently on Cardizem drip as well as heparin drip.  Will get echocardiogram.  Cardiology consultation.  Patient may likely need to be on Eliquis moving forward.  No chest pain.  #2 history of CLL autoimmune hemolytic anemia: Continue steroids per oncology.  #3 hyperlipidemia: Confirm on resume home regimen.  #4 BPH: Continue home regimen.  #5 peptic ulcer disease: Continue PPI    Advance Care Planning:   Code Status: Full Code   Consults: Cardiologist, Dr Marsh Dolly  Family Communication: No family at bedside  Severity of Illness: The appropriate patient status for this patient is OBSERVATION. Observation status is judged to be reasonable and necessary in order to provide the required intensity of service to  ensure the patient's safety. The patient's presenting symptoms, physical exam findings, and initial radiographic and laboratory data in the context of their medical condition is felt to place them at decreased risk for further clinical deterioration. Furthermore, it is anticipated that the patient will be medically stable for discharge from the hospital within 2 midnights of admission.   AuthorLonia Blood, MD 04/04/2023 12:44 AM  For on call review [email or web address].

## 2023-04-04 NOTE — Plan of Care (Signed)

## 2023-04-04 NOTE — Hospital Course (Signed)
 Jason Abbott. is a 78 y.o. male with a history of paroxysmal atrial fibrillation, CLL, hyperlipidemia, CVA, BPH, PUD, autoimmune hemolytic anemia.  Patient presented secondary to atrial fibrillation and started on diltiazem and heparin IV. Cardiology consulted.

## 2023-04-04 NOTE — Consult Note (Addendum)
 Cardiology Consultation   Patient ID: Jason Abbott. MRN: [id number]; DOB: October 23, 1945  Admit date: 04/03/2023 Date of Consult: 04/04/2023  PCP:  Monica Becton, MD   [facility] Providers Cardiologist:  None        Patient Profile:   Jason Abbott. is a 78 y.o. male with a hx of paroxysmal atrial fibrillation S/P thoracic aortic aneurysm repair, chronic RBBB, CLL, hyperlipidemia, previous CVA, BPH, peptic ulcer disease, autoimmune hemolytic anemia who is being seen 04/04/2023 for the evaluation of atrial fibrillation at the request of Dr. Caleb Popp.  History of Present Illness:   Jason Abbott presented to [facility] on 03/28 with palpitations and chest pain. Patient had been seeing his oncologist Dr. Myna Hidalgo yesterday, was noted to have rapid HR on physical exam. Clinic EKG with afib/RVR with ventricular rate 115bpm. Although with minimal symptoms, given new onset, patient sent to the ED. Patient with HR up to 130s, largely asymptomatic. He was started on Diltiazem infusion and heparin and transferred to [facility] for admission.   On exam today, patient reports noticing worsened fatigue/dyspnea with exertion over the past 6-8 weeks. In the last couple of weeks, patient began to check his pulse with these symptoms and noticed that his heart rhythm seemed both fast and irregular. Largely asymptomatic when at rest. Patient confirms that he had post procedure afib following his CT surgery in 2024  Past Medical History:  Diagnosis Date   Aberrant right subclavian artery    Aortic arch aneurysm (HCC)    Arthritis    Atrial fibrillation with rapid ventricular response (HCC) 04/03/2023   Bowel obstruction (HCC)    CLL (chronic lymphocytic leukemia) (HCC)    COVID 2021   hospitalized for it   Diverticul disease small and large intestine, no perforati or abscess    GERD (gastroesophageal reflux disease)    uses tums as needed   H/O urinary infection    Pneumonia  09/22/2021   Shingles outbreak 10/03/2013   Stroke (HCC) 01/2019    Past Surgical History:  Procedure Laterality Date   AORTIC ARCH DEBRANCHING N/A 01/10/2022   Procedure: AORTIC ARCH DEBRANCHING WITH 16 X 8 X 10 MM HEMASHIELD GOLD GRAFT;  Surgeon: Alleen Borne, MD;  Location: [facility] OR;  Service: Open Heart Surgery;  Laterality: N/A;   CAROTID-SUBCLAVIAN BYPASS GRAFT Right 12/18/2021   Procedure: RIGHT BYPASS GRAFT CAROTID-SUBCLAVIAN;  Surgeon: Nada Libman, MD;  Location: [facility] OR;  Service: Vascular;  Laterality: Right;   CARPAL TUNNEL RELEASE Right 11/25/2021   Procedure: CARPAL TUNNEL RELEASE;  Surgeon: Marlyne Beards, MD;  Location: [facility] OR;  Service: Orthopedics;  Laterality: Right;  or MAC with regional block 60   HERNIA REPAIR     MECKEL DIVERTICULUM EXCISION     ORIF FINGER / THUMB FRACTURE     TEE WITHOUT CARDIOVERSION N/A 01/10/2022   Procedure: TRANSESOPHAGEAL ECHOCARDIOGRAM (TEE);  Surgeon: Alleen Borne, MD;  Location: [facility] OR;  Service: Open Heart Surgery;  Laterality: N/A;   TENOSYNOVECTOMY Right 11/25/2021   Procedure: FLEXOR TENOSYNOVECTOMY;  Surgeon: Marlyne Beards, MD;  Location: [facility] OR;  Service: Orthopedics;  Laterality: Right;  or MAC with regional block 60   THORACIC AORTIC ENDOVASCULAR STENT GRAFT N/A 01/10/2022   Procedure: THORACIC AORTIC ENDOVASCULAR STENT GRAFT;  Surgeon: Nada Libman, MD;  Location: [facility] OR;  Service: Vascular;  Laterality: N/A;   ulna nerve surgery         Inpatient Medications: Scheduled Meds:  apixaban  5 mg Oral BID   vitamin B-12  1,000 mcg Oral Daily   diltiazem  30 mg Oral Q6H   famciclovir  250 mg Oral BID   finasteride  5 mg Oral Daily   fluconazole  100 mg Oral Daily   folic acid  1 mg Oral BID   predniSONE  10 mg Oral Q breakfast   tamsulosin  0.4 mg Oral BID   Continuous Infusions:  diltiazem (CARDIZEM) infusion 5 mg/hr (04/04/23 1152)   PRN Meds: acetaminophen, doxylamine (Sleep), ondansetron (ZOFRAN)  IV  Allergies:    Allergies  Allergen Reactions   Levaquin [Levofloxacin] Other (See Comments)    Hx of aortic aneurysm, has since been repaired.     Social History:   Social History   Socioeconomic History   Marital status: Married    Spouse name: Boneta Lucks   Number of children: 1   Years of education: 14   Highest education level: Associate degree: occupational, Scientist, product/process development, or vocational program  Occupational History    Comment: retired Electronics engineer, Solicitor,  Tobacco Use   Smoking status: Former    Current packs/day: 0.00    Average packs/day: 0.5 packs/day for 23.0 years (11.5 ttl pk-yrs)    Types: Cigarettes    Start date: 1966    Quit date: 1989    Years since quitting: 36.2   Smokeless tobacco: Never  Vaping Use   Vaping status: Never Used  Substance and Sexual Activity   Alcohol use: Not Currently    Comment: rarely   Drug use: No   Sexual activity: Not Currently  Other Topics Concern   Not on file  Social History Narrative   Lives with his wife. His son lives in [place]. He enjoys music.   Social Drivers of Corporate investment banker Strain: Low Risk  (11/04/2022)   Overall Financial Resource Strain (CARDIA)    Difficulty of Paying Living Expenses: Not hard at all  Food Insecurity: No Food Insecurity (04/03/2023)   Hunger Vital Sign    Worried About Running Out of Food in the Last Year: Never true    Ran Out of Food in the Last Year: Never true  Transportation Needs: No Transportation Needs (04/03/2023)   PRAPARE - Administrator, Civil Service (Medical): No    Lack of Transportation (Non-Medical): No  Physical Activity: Insufficiently Active (11/04/2022)   Exercise Vital Sign    Days of Exercise per Week: 1 day    Minutes of Exercise per Session: 30 min  Stress: No Stress Concern Present (11/04/2022)   [employer] of Occupational Health - Occupational Stress Questionnaire    Feeling of Stress : Not at all  Social Connections: Unknown (04/03/2023)    Social Connection and Isolation Panel [NHANES]    Frequency of Communication with Friends and Family: Three times a week    Frequency of Social Gatherings with Friends and Family: Twice a week    Attends Religious Services: Patient declined    Database administrator or Organizations: No    Attends Banker Meetings: Never    Marital Status: Married  Catering manager Violence: Not At Risk (04/03/2023)   Humiliation, Afraid, Rape, and Kick questionnaire    Fear of Current or Ex-Partner: No    Emotionally Abused: No    Physically Abused: No    Sexually Abused: No    Family History:    Family History  Problem Relation Age of Onset   Cancer Mother  pancreatic   COPD Father    Diabetes Sister    Diabetes Brother    Diabetes Brother      ROS:  Please see the history of present illness.   All other ROS reviewed and negative.     Physical Exam/Data:   Vitals:   04/04/23 0021 04/04/23 0324 04/04/23 0436 04/04/23 1000  BP: (!) 134/91 (!) 127/93  (!) 132/90  Pulse: 70 83  85  Resp: 14 16  18   Temp: 97.9 F (36.6 C) 97.6 F (36.4 C)  (!) 97.5 F (36.4 C)  TempSrc: Oral Oral  Oral  SpO2: 95% 93%  96%  Weight:   90.3 kg   Height:        Intake/Output Summary (Last 24 hours) at 04/04/2023 1356 Last data filed at 04/04/2023 0308 Gross per 24 hour  Intake 164.61 ml  Output --  Net 164.61 ml      04/04/2023    4:36 AM 04/03/2023   10:19 PM 04/03/2023    2:53 PM  Last 3 Weights  Weight (lbs) 199 lb 1.6 oz 199 lb 199 lb 15.3 oz  Weight (kg) 90.311 kg 90.266 kg 90.7 kg     Body mass index is 28.57 kg/m.  General:  Well nourished, well developed, in no acute distress HEENT: normal Neck: no JVD Vascular: No carotid bruits; Distal pulses 2+ bilaterally Cardiac:  normal S1, S2; irregularly irregular; no murmur  Lungs:  clear to auscultation bilaterally, no wheezing, rhonchi or rales  Abd: soft, nontender, no hepatomegaly  Ext: no edema Musculoskeletal:  No  deformities, BUE and BLE strength normal and equal Skin: warm and dry  Neuro:  CNs 2-12 intact, no focal abnormalities noted Psych:  Normal affect   EKG:  The EKG was personally reviewed and demonstrates:  atrial fibrillation with RBBB morphology, RVR up to 127, improved to 97 on later ECG.  Telemetry:  Telemetry was personally reviewed and demonstrates:  persistent atrial fibrillation, rates controlled since transfer with average HR <100.  Relevant CV Studies:  01/23/23 TTE  IMPRESSIONS     1. Left ventricular ejection fraction, by estimation, is 50 to 55%. The  left ventricle has low normal function. The left ventricle has no regional  wall motion abnormalities. There is mild left ventricular hypertrophy.  Left ventricular diastolic  parameters are consistent with Grade I diastolic dysfunction (impaired  relaxation). There is abnormal septal motion likely secondary to  conduction delay.   2. Right ventricular systolic function is mildly reduced. The right  ventricular size is normal. There is normal pulmonary artery systolic  pressure. The estimated right ventricular systolic pressure is 27.0 mmHg.   3. Left atrial size was mildly dilated.   4. The mitral valve is grossly normal. Trivial mitral valve  regurgitation. No evidence of mitral stenosis.   5. The aortic valve is tricuspid. There is mild calcification of the  aortic valve. There is mild thickening of the aortic valve. Aortic valve  regurgitation is trivial. No aortic stenosis is present.   6. Aortic dilatation noted. There is borderline dilatation of the aortic  root, measuring 42 mm.   7. The inferior vena cava is normal in size with greater than 50%  respiratory variability, suggesting right atrial pressure of 3 mmHg.   8. Cannot exclude a small PFO.   FINDINGS   Left Ventricle: Left ventricular ejection fraction, by estimation, is 50  to 55%. The left ventricle has low normal function. The left ventricle has  no  regional wall motion abnormalities. The left ventricular internal  cavity size was normal in size.  There is mild left ventricular hypertrophy. Abnormal septal motion likely  secondary to conduction delay. Left ventricular diastolic parameters are  consistent with Grade I diastolic dysfunction (impaired relaxation).   Right Ventricle: The right ventricular size is normal. Right vetricular  wall thickness was not well visualized. Right ventricular systolic  function is mildly reduced. There is normal pulmonary artery systolic  pressure. The tricuspid regurgitant velocity  is 2.45 m/s, and with an assumed right atrial pressure of 3 mmHg, the  estimated right ventricular systolic pressure is 27.0 mmHg.   Left Atrium: Left atrial size was mildly dilated.   Right Atrium: Right atrial size was normal in size.   Pericardium: There is no evidence of pericardial effusion. Presence of  epicardial fat layer.   Mitral Valve: The mitral valve is grossly normal. Trivial mitral valve  regurgitation. No evidence of mitral valve stenosis.   Tricuspid Valve: The tricuspid valve is normal in structure. Tricuspid  valve regurgitation is trivial. No evidence of tricuspid stenosis.   Aortic Valve: The aortic valve is tricuspid. There is mild calcification  of the aortic valve. There is mild thickening of the aortic valve. Aortic  valve regurgitation is trivial. No aortic stenosis is present.   Pulmonic Valve: The pulmonic valve was normal in structure. Pulmonic valve  regurgitation is trivial. No evidence of pulmonic stenosis.   Aorta: Aortic dilatation noted. There is borderline dilatation of the  aortic root, measuring 42 mm.   Venous: The inferior vena cava is normal in size with greater than 50%  respiratory variability, suggesting right atrial pressure of 3 mmHg.   IAS/Shunts: There is redundancy of the interatrial septum. Cannot exclude  a small PFO.   Laboratory Data:  High Sensitivity  Troponin:  No results for input(s): "TROPONINIHS" in the last 720 hours.   Chemistry Recent Labs  Lab 04/03/23 1332 04/03/23 1545 04/04/23 0738  NA 141 139 140  K 4.3 4.3 3.8  CL 101 102 105  CO2 31 29 25   GLUCOSE 120* 129* 98  BUN 20 21 16   CREATININE 1.21 1.05 1.08  CALCIUM 9.8 9.5 8.8*  GFRNONAA >60 >60 >60  ANIONGAP 9 8 10     Recent Labs  Lab 04/03/23 1332  PROT 6.3*  ALBUMIN 4.6  AST 16  ALT 15  ALKPHOS 58  BILITOT 0.6   Lipids  Recent Labs  Lab 04/04/23 0738  CHOL 154  TRIG 158*  HDL 41  LDLCALC 81  CHOLHDL 3.8    Hematology Recent Labs  Lab 04/03/23 1332 04/03/23 1333 04/03/23 1545 04/04/23 0738  WBC 9.7  --  7.6 8.0  RBC 4.72 4.73 4.53 4.55  HGB 15.0  --  14.2 14.3  HCT 45.4  --  42.6 42.6  MCV 96.2  --  94.0 93.6  MCH 31.8  --  31.3 31.4  MCHC 33.0  --  33.3 33.6  RDW 17.1*  --  17.1* 16.9*  PLT 97*  --  93* 92*   Thyroid No results for input(s): "TSH", "FREET4" in the last 168 hours.  BNPNo results for input(s): "BNP", "PROBNP" in the last 168 hours.  DDimer No results for input(s): "DDIMER" in the last 168 hours.   Radiology/Studies:  DG Chest Port 1 View Result Date: 04/03/2023 CLINICAL DATA:  Atrial fibrillation. EXAM: PORTABLE CHEST 1 VIEW COMPARISON:  Chest radiograph dated 12/30/2022. FINDINGS: The heart size and  mediastinal contours are unchanged with thoracic aortic stent graft in place. Prior median sternotomy. No focal consolidation, sizeable pleural effusion, or pneumothorax. No acute osseous abnormality. IMPRESSION: No acute cardiopulmonary findings. Electronically Signed   By: Hart Robinsons M.D.   On: 04/03/2023 16:02     Assessment and Plan:   Atrial fibrillation with RVR (now rate controlled) CHA2DS2-VASc Score = 3  Patient found in afib at oncology appointment yesterday. Reports increased dyspnea and fatigue with exertion for the past 6-8 weeks, feels okay at rest. Patient now rate controlled with IV diltiazem, also on  heparin. Discussed management of atrial fibrillation including inpatient vs outpatient cardioversion as well as acute rhythm control. Given mild/stable symptoms, would favor rate control strategy with outpatient DCCV after 4 weeks of uninterrupted OAC. Transition IV diltiazem to PO, 30mg  Q6 hours. If rates remain controlled, consolidate to Cardizem CD at discharge. Transition from heparin to Eliquis. Per patient's oncologist note dated 03/28 "From my point of view, I do not see a problem with him being anticoagulated." Check TSH Check TTE  Per primary team: CLL Autoimmune hemolytic anemia BPH Peptic ulcer disease thrombocytopenia   Risk Assessment/Risk Scores:          CHA2DS2-VASc Score = 3   This indicates a 3.2% annual risk of stroke. The patient's score is based upon: CHF History: 0 HTN History: 0 Diabetes History: 0 Stroke History: 0 Vascular Disease History: 1 Age Score: 2 Gender Score: 0         For questions or updates, please contact [facility] Please consult [email or web address] for contact info under    Signed, Perlie Gold, PA-C  04/04/2023 1:56 PM   I have seen, examined the patient, and reviewed the above assessment and plan.    HPI: Jason Abbott. is a 78 y.o. male with a hx of paroxysmal atrial fibrillation S/P thoracic aortic aneurysm repair, chronic RBBB, CLL, hyperlipidemia, previous CVA, BPH, peptic ulcer disease, autoimmune hemolytic anemia who was found to have a rapid heart rate during an oncology office visit yesterday.  EKG was done confirming presence of atrial fibrillation and he was sent to the ED.  In the ED he was started on IV diltiazem and IV heparin and transferred to [facility] for further management.  Currently, he reports feeling relatively well and has no new or acute complaints.  Patient notes irregular and rapid heart beating when checking his pulse for the past few weeks.  He has had a little bit more fatigue or dyspnea  with exertion during this time as well but overall is able to function well without significant limitation.  GEN: No acute distress.   Neck: No JVD Cardiac: RRR, no murmurs, rubs, or gallops.  Resp: Normal work of breathing.  Ext: No edema.  Psych: Normal affect   Assessment:  #.  Paroxysmal atrial fibrillation: Last episode occurred following thoracic aortic aneurysm repair. #.  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 Exact timeframe of his current episode is unclear, but patient thinks sometime within the past 6 to 8 weeks.  He does endorse palpitations but appears to be minimally symptomatic.  He was started on IV diltiazem and heparin in the ED for rate control. -We will transition his IV diltiazem to short acting oral diltiazem and uptitrate to achieve rate control.  Will then transition to long-acting on discharge. -We will transition his heparin to Eliquis. -If patient remains in the hospital on Monday then we can  pursue TEE and cardioversion.  If he is discharged prior to Monday then we can have him return in 3 to 4 weeks for outpatient cardioversion after starting Eliquis. -Transthoracic echocardiogram has been ordered.  Nobie Putnam, MD 04/04/2023 4:14 PM

## 2023-04-04 NOTE — Progress Notes (Signed)
 PROGRESS NOTE    Jason Abbott.  VHQ:[id number] DOB: Jun 14, 1945 DOA: 04/03/2023 PCP: Monica Becton, MD   Brief Narrative: Jason Klutz. is a 78 y.o. male with a history of paroxysmal atrial fibrillation, CLL, hyperlipidemia, CVA, BPH, PUD, autoimmune hemolytic anemia.  Patient presented secondary to atrial fibrillation and started on diltiazem and heparin IV. Cardiology consulted.   Assessment and Plan:  Atrial fibrillation with RVR Recurrent. Prior history previously on Eliquis. Patient without significant symptoms. Patient started on Cardizem IV and Heparin IV with improved rate control. Cardiology consulted and Transthoracic Echocardiogram ordered. -Follow-up cardiology recommendations -Continue Cardizem and Heparin IV  History of CLL Autoimmune hemolytic anemia Patient follows with oncology as an outpatient. Currently on prednisone -Resume home prednisone 10 mg daily, folic acid, famciclovir, diflucan  Hyperlipidemia Noted. Not on lipid lowering therapy.  BPH -Continue tamsulosin and finasteride  Peptic ulcer disease Noted. Not on pharmacologic therapy. -Avoid NSAIDs  Thrombocytopenia Chronic.   DVT prophylaxis: Heparin IV Code Status:   Code Status: Full Code Family Communication: Wife at bedside Disposition Plan: Discharge likely home in 24 hours pending ongoing cardiology recommendations/management   Consultants:  Cardiology  Procedures:  None  Antimicrobials: Famciclovir Fluconazole    Subjective: Patient reports no chest pain or dyspnea. Feels well.  Objective: BP (!) 127/93 (BP Location: Left Arm)   Pulse 83   Temp 97.6 F (36.4 C) (Oral)   Resp 16   Ht 5\' 10"  (1.778 m)   Wt 90.3 kg   SpO2 93%   BMI 28.57 kg/m   Examination:  General exam: Appears calm and comfortable Respiratory system: Clear to auscultation. Respiratory effort normal. Cardiovascular system: S1 & S2 heard, irregular rhythm, normal rate. No  murmurs, rubs, gallops or clicks. Gastrointestinal system: Abdomen is nondistended, soft and nontender. Normal bowel sounds heard. Central nervous system: Alert and oriented. No focal neurological deficits. Musculoskeletal: No calf tenderness Psychiatry: Judgement and insight appear normal. Mood & affect appropriate.    Data Reviewed: I have personally reviewed following labs and imaging studies  CBC Lab Results  Component Value Date   WBC 7.6 04/03/2023   RBC 4.53 04/03/2023   HGB 14.2 04/03/2023   HCT 42.6 04/03/2023   MCV 94.0 04/03/2023   MCH 31.3 04/03/2023   PLT 93 (L) 04/03/2023   MCHC 33.3 04/03/2023   RDW 17.1 (H) 04/03/2023   LYMPHSABS 2.1 04/03/2023   MONOABS 0.5 04/03/2023   EOSABS 0.1 04/03/2023   BASOSABS 0.1 04/03/2023     Last metabolic panel Lab Results  Component Value Date   NA 139 04/03/2023   K 4.3 04/03/2023   CL 102 04/03/2023   CO2 29 04/03/2023   BUN 21 04/03/2023   CREATININE 1.05 04/03/2023   GLUCOSE 129 (H) 04/03/2023   GFRNONAA >60 04/03/2023   GFRAA >60 08/22/2019   CALCIUM 9.5 04/03/2023   PHOS 4.0 09/13/2021   PROT 6.3 (L) 04/03/2023   ALBUMIN 4.6 04/03/2023   LABGLOB 1.7 11/05/2022   BILITOT 0.6 04/03/2023   ALKPHOS 58 04/03/2023   AST 16 04/03/2023   ALT 15 04/03/2023   ANIONGAP 8 04/03/2023    GFR: Estimated Creatinine Clearance: 64.5 mL/min (by C-G formula based on SCr of 1.05 mg/dL).  No results found for this or any previous visit (from the past 240 hours).    Radiology Studies: DG Chest Port 1 View Result Date: 04/03/2023 CLINICAL DATA:  Atrial fibrillation. EXAM: PORTABLE CHEST 1 VIEW COMPARISON:  Chest radiograph dated  12/30/2022. FINDINGS: The heart size and mediastinal contours are unchanged with thoracic aortic stent graft in place. Prior median sternotomy. No focal consolidation, sizeable pleural effusion, or pneumothorax. No acute osseous abnormality. IMPRESSION: No acute cardiopulmonary findings. Electronically  Signed   By: Hart Robinsons M.D.   On: 04/03/2023 16:02      LOS: 1 day    Jacquelin Hawking, MD [facility] 04/04/2023, 8:16 AM   If 7PM-7AM, please contact night-coverage [email or web address]

## 2023-04-04 NOTE — Progress Notes (Addendum)
 PHARMACY - ANTICOAGULATION CONSULT NOTE  Pharmacy Consult for heparin  Indication: atrial fibrillation  Allergies  Allergen Reactions   Levaquin [Levofloxacin] Other (See Comments)    Due to medical hx    Patient Measurements: Height: 5\' 10"  (177.8 cm) Weight: 90.3 kg (199 lb 1.6 oz) IBW/kg (Calculated) : 73 HEPARIN DW (KG): 90.3  Vital Signs: Temp: 97.6 F (36.4 C) (03/29 0324) Temp Source: Oral (03/29 0324) BP: 127/93 (03/29 0324) Pulse Rate: 83 (03/29 0324)  Labs: Recent Labs    04/03/23 1332 04/03/23 1545  HGB 15.0 14.2  HCT 45.4 42.6  PLT 97* 93*  CREATININE 1.21 1.05    Estimated Creatinine Clearance: 64.5 mL/min (by C-G formula based on SCr of 1.05 mg/dL).   Medical History: Past Medical History:  Diagnosis Date   Aberrant right subclavian artery    Aortic arch aneurysm (HCC)    Arthritis    Atrial fibrillation with rapid ventricular response (HCC) 04/03/2023   Bowel obstruction (HCC)    CLL (chronic lymphocytic leukemia) (HCC)    COVID 2021   hospitalized for it   Diverticul disease small and large intestine, no perforati or abscess    GERD (gastroesophageal reflux disease)    uses tums as needed   H/O urinary infection    Pneumonia 09/22/2021   Shingles outbreak 10/03/2013   Stroke (HCC) 01/2019    Assessment: Patient admitted with CC of Afib w/ RVR. Not on anticoagulation PTA. HgB 14.2 and PLTs 93. Appears thrombocytopenia is chronic with CBC on 2/11 and PLT of 103. Pharmacy consulted to dose heparin gtt.   Heparin level this am subtherapeutic (0.27) on 1250 units/hr. No issues with heparin infusion or signs of bleeding noted. AM CMP in process. Hgb 14.2 PLTc 93 (BL 90-130s)  Goal of Therapy:  Heparin level 0.3-0.7 units/ml Monitor platelets by anticoagulation protocol: Yes   Pharmacy now consulted to initiate Apixaban. Pharmacy will sign off at this time.   Plan:  Discontinue heparin gtt  Start Apixaban 5mg  PO twice daily Monitor CBC  given DDI with fluconazole and diltiazem   Verdene Rio, PharmD PGY1 Pharmacy Resident

## 2023-04-04 NOTE — Progress Notes (Signed)
 Echocardiogram 2D Echocardiogram has been performed.  Lucendia Herrlich 04/04/2023, 3:21 PM

## 2023-04-05 DIAGNOSIS — C9110 Chronic lymphocytic leukemia of B-cell type not having achieved remission: Secondary | ICD-10-CM | POA: Diagnosis not present

## 2023-04-05 DIAGNOSIS — E782 Mixed hyperlipidemia: Secondary | ICD-10-CM | POA: Diagnosis not present

## 2023-04-05 DIAGNOSIS — I480 Paroxysmal atrial fibrillation: Secondary | ICD-10-CM | POA: Diagnosis not present

## 2023-04-05 DIAGNOSIS — I4891 Unspecified atrial fibrillation: Secondary | ICD-10-CM | POA: Diagnosis not present

## 2023-04-05 LAB — CBC
HCT: 42.3 % (ref 39.0–52.0)
Hemoglobin: 14 g/dL (ref 13.0–17.0)
MCH: 31.3 pg (ref 26.0–34.0)
MCHC: 33.1 g/dL (ref 30.0–36.0)
MCV: 94.6 fL (ref 80.0–100.0)
Platelets: 93 10*3/uL — ABNORMAL LOW (ref 150–400)
RBC: 4.47 MIL/uL (ref 4.22–5.81)
RDW: 16.7 % — ABNORMAL HIGH (ref 11.5–15.5)
WBC: 6.5 10*3/uL (ref 4.0–10.5)
nRBC: 0 % (ref 0.0–0.2)

## 2023-04-05 MED ORDER — METOPROLOL TARTRATE 25 MG PO TABS
25.0000 mg | ORAL_TABLET | Freq: Four times a day (QID) | ORAL | Status: DC
  Administered 2023-04-05 – 2023-04-06 (×5): 25 mg via ORAL
  Filled 2023-04-05 (×5): qty 1

## 2023-04-05 MED ORDER — PANTOPRAZOLE SODIUM 40 MG PO TBEC
40.0000 mg | DELAYED_RELEASE_TABLET | Freq: Every day | ORAL | Status: DC
  Administered 2023-04-05 – 2023-04-06 (×2): 40 mg via ORAL
  Filled 2023-04-05 (×2): qty 1

## 2023-04-05 NOTE — Progress Notes (Signed)
 PROGRESS NOTE    Jason Abbott.  UJW:[id number] DOB: 12-Dec-1945 DOA: 04/03/2023 PCP: Monica Becton, MD   Brief Narrative: Jason Abbott. is a 78 y.o. male with a history of paroxysmal atrial fibrillation, CLL, hyperlipidemia, CVA, BPH, PUD, autoimmune hemolytic anemia.  Patient presented secondary to atrial fibrillation and started on diltiazem and heparin IV. Cardiology consulted.   Assessment and Plan:  Atrial fibrillation with RVR Recurrent. Prior history previously on Eliquis. Patient without significant symptoms. Patient started on Cardizem IV and Heparin IV with improved rate control. Cardiology consulted and Transthoracic Echocardiogram ordered and significant for LVEF of 45-50% with global hypokinesis and indeterminate diastolic parameters. Patient transitioned to Cardizem PO and Eliquis, however Cardizem changed to metoprolol secondary to reduced LVEF.  -Continue Eliquis and metoprolol -Follow-up cardiology recommendations: Eliquis, metoprolol PO, Transesophageal Echocardiogram/cardioversion for 3/31  History of CLL Autoimmune hemolytic anemia Patient follows with oncology as an outpatient. Currently on prednisone -Resume home prednisone 10 mg daily, folic acid, famciclovir, diflucan  Hyperlipidemia Noted. Not on lipid lowering therapy.  BPH -Continue tamsulosin and finasteride  Peptic ulcer disease Noted. Not on pharmacologic therapy. -Avoid NSAIDs -Start Protonix since patient is starting anticoagulation  Thrombocytopenia Chronic.  HFrEF Unclear chronicity. Noted on Transthoracic Echocardiogram. -Cardiology recommendations for GDMT  Ascending aorta aneurysm Noted on Transthoracic Echocardiogram measuring 49 mm with dilation of the aortic root measuring 42 mm. -Cardiology follow-up   DVT prophylaxis: Heparin IV Code Status:   Code Status: Full Code Family Communication: Wife at bedside Disposition Plan: Discharge likely home in 24 hours  pending ongoing cardiology recommendations/management   Consultants:  Cardiology  Procedures:  None  Antimicrobials: Famciclovir Fluconazole    Subjective: No concerns this Abbott. Slept well overnight.  Objective: BP 118/86 (BP Location: Left Arm)   Pulse 83   Temp 97.7 F (36.5 C) (Oral)   Resp 17   Ht 5\' 10"  (1.778 m)   Wt 89 kg   SpO2 97%   BMI 28.17 kg/m   Examination:  General exam: Appears calm and comfortable Respiratory system: Clear to auscultation. Respiratory effort normal. Cardiovascular system: S1 & S2 heard, irregular rhythm, normal rate. No murmurs, rubs, gallops or clicks. Gastrointestinal system: Abdomen is nondistended, soft and nontender. Normal bowel sounds heard. Central nervous system: Alert and oriented. No focal neurological deficits. Musculoskeletal: No edema. No calf tenderness Psychiatry: Judgement and insight appear normal. Mood & affect appropriate.    Data Reviewed: I have personally reviewed following labs and imaging studies  CBC Lab Results  Component Value Date   WBC 6.5 04/05/2023   RBC 4.47 04/05/2023   HGB 14.0 04/05/2023   HCT 42.3 04/05/2023   MCV 94.6 04/05/2023   MCH 31.3 04/05/2023   PLT 93 (L) 04/05/2023   MCHC 33.1 04/05/2023   RDW 16.7 (H) 04/05/2023   LYMPHSABS 2.1 04/03/2023   MONOABS 0.5 04/03/2023   EOSABS 0.1 04/03/2023   BASOSABS 0.1 04/03/2023     Last metabolic panel Lab Results  Component Value Date   NA 140 04/04/2023   K 3.8 04/04/2023   CL 105 04/04/2023   CO2 25 04/04/2023   BUN 16 04/04/2023   CREATININE 1.08 04/04/2023   GLUCOSE 98 04/04/2023   GFRNONAA >60 04/04/2023   GFRAA >60 08/22/2019   CALCIUM 8.8 (L) 04/04/2023   PHOS 4.0 09/13/2021   PROT 6.3 (L) 04/03/2023   ALBUMIN 4.6 04/03/2023   LABGLOB 1.7 11/05/2022   BILITOT 0.6 04/03/2023   ALKPHOS 58 04/03/2023  AST 16 04/03/2023   ALT 15 04/03/2023   ANIONGAP 10 04/04/2023    GFR: Estimated Creatinine Clearance: 62.3  mL/min (by C-G formula based on SCr of 1.08 mg/dL).  No results found for this or any previous visit (from the past 240 hours).    Radiology Studies: ECHOCARDIOGRAM COMPLETE Result Date: 04/04/2023    ECHOCARDIOGRAM REPORT   Patient Name:   Jason Abbott. Date of Exam: 04/04/2023 Medical Rec #:  [id number]           Height:       70.0 in Accession #:    [id number]          Weight:       199.1 lb Date of Birth:  Sep 23, 1945           BSA:          2.083 m Patient Age:    78 years            BP:           127/93 mmHg Patient Gender: M                   HR:           82 bpm. Exam Location:  Inpatient Procedure: 2D Echo, Cardiac Doppler and Color Doppler (Both Spectral and Color            Flow Doppler were utilized during procedure). Indications:    Atrial Fibrillation I48.91  History:        Patient has prior history of Echocardiogram examinations, most                 recent 01/23/2023. Arrythmias:Atrial Fibrillation; Risk                 Factors:Dyslipidemia and Former Smoker. Aortic Arch Aneurysm.  Sonographer:    Lucendia Herrlich RCS Referring Phys: [id number] MOHAMMAD L GARBA IMPRESSIONS  1. Left ventricular ejection fraction, by estimation, is 45 to 50%. The left ventricle has mildly decreased function. The left ventricle demonstrates global hypokinesis. Left ventricular diastolic parameters are indeterminate.  2. Right ventricular systolic function is low normal. The right ventricular size is normal.  3. The mitral valve is grossly normal. No evidence of mitral valve regurgitation. No evidence of mitral stenosis.  4. The aortic valve is tricuspid. Aortic valve regurgitation is not visualized. Aortic valve sclerosis is present, with no evidence of aortic valve stenosis.  5. Aortic dilatation noted. Aneurysm of the ascending aorta, measuring 49 mm. There is mild dilatation of the aortic root, measuring 42 mm.  6. The inferior vena cava is dilated in size with >50% respiratory variability, suggesting right  atrial pressure of 8 mmHg. Comparison(s): Prior images reviewed side by side. LVEF is less vigorous. Aortic dimensions have notably increased. FINDINGS  Left Ventricle: Left ventricular ejection fraction, by estimation, is 45 to 50%. The left ventricle has mildly decreased function. The left ventricle demonstrates global hypokinesis. Strain was performed and the global longitudinal strain is indeterminate. The left ventricular internal cavity size was normal in size. There is no left ventricular hypertrophy. Left ventricular diastolic parameters are indeterminate. Right Ventricle: The right ventricular size is normal. No increase in right ventricular wall thickness. Right ventricular systolic function is low normal. Left Atrium: Left atrial size was normal in size. Right Atrium: Right atrial size was normal in size. Pericardium: There is no evidence of pericardial effusion. Mitral Valve: The mitral valve is grossly normal. No  evidence of mitral valve regurgitation. No evidence of mitral valve stenosis. Tricuspid Valve: The tricuspid valve is grossly normal. Tricuspid valve regurgitation is trivial. No evidence of tricuspid stenosis. Aortic Valve: The aortic valve is tricuspid. Aortic valve regurgitation is not visualized. Aortic valve sclerosis is present, with no evidence of aortic valve stenosis. Aortic valve peak gradient measures 3.8 mmHg. Pulmonic Valve: The pulmonic valve was normal in structure. Pulmonic valve regurgitation is not visualized. Aorta: Aortic dilatation noted. There is mild dilatation of the aortic root, measuring 42 mm. There is an aneurysm involving the ascending aorta measuring 49 mm. Venous: The inferior vena cava is dilated in size with greater than 50% respiratory variability, suggesting right atrial pressure of 8 mmHg. IAS/Shunts: The atrial septum is grossly normal. Additional Comments: 3D was performed not requiring image post processing on an independent workstation and was  indeterminate.  LEFT VENTRICLE PLAX 2D LVIDd:         5.20 cm   Diastology LVIDs:         3.80 cm   LV e' medial:    6.80 cm/s LV PW:         1.30 cm   LV E/e' medial:  11.5 LV IVS:        1.00 cm   LV e' lateral:   11.70 cm/s LVOT diam:     2.20 cm   LV E/e' lateral: 6.7 LV SV:         48 LV SV Index:   23 LVOT Area:     3.80 cm  RIGHT VENTRICLE            IVC RV S prime:     9.08 cm/s  IVC diam: 2.10 cm TAPSE (M-mode): 1.0 cm LEFT ATRIUM           Index        RIGHT ATRIUM           Index LA diam:      5.50 cm 2.64 cm/m   RA Area:     18.50 cm LA Vol (A2C): 50.5 ml 24.24 ml/m  RA Volume:   35.80 ml  17.18 ml/m LA Vol (A4C): 50.7 ml 24.34 ml/m  AORTIC VALVE AV Area (Vmax): 3.05 cm AV Vmax:        97.90 cm/s AV Peak Grad:   3.8 mmHg LVOT Vmax:      78.47 cm/s LVOT Vmean:     49.100 cm/s LVOT VTI:       0.125 m  AORTA Ao Root diam: 4.20 cm Ao Asc diam:  4.90 cm MITRAL VALVE               TRICUSPID VALVE MV Area (PHT): 5.46 cm    TR Peak grad:   14.7 mmHg MV Decel Time: 139 msec    TR Vmax:        192.00 cm/s MV E velocity: 78.05 cm/s MV A velocity: 36.40 cm/s  SHUNTS MV E/A ratio:  2.14        Systemic VTI:  0.13 m                            Systemic Diam: 2.20 cm Riley Lam MD Electronically signed by Riley Lam MD Signature Date/Time: 04/04/2023/4:19:17 PM    Final    DG Chest Port 1 View Result Date: 04/03/2023 CLINICAL DATA:  Atrial fibrillation. EXAM: PORTABLE CHEST 1 VIEW COMPARISON:  Chest radiograph dated 12/30/2022.  FINDINGS: The heart size and mediastinal contours are unchanged with thoracic aortic stent graft in place. Prior median sternotomy. No focal consolidation, sizeable pleural effusion, or pneumothorax. No acute osseous abnormality. IMPRESSION: No acute cardiopulmonary findings. Electronically Signed   By: Hart Robinsons M.D.   On: 04/03/2023 16:02      LOS: 2 days    Jacquelin Hawking, MD [facility] 04/05/2023, 10:48 AM   If 7PM-7AM, please contact  night-coverage [email or web address]

## 2023-04-05 NOTE — Progress Notes (Signed)
   Patient Name: Jason Abbott. Date of Encounter: 04/05/2023 [facility] Cardiologist: None   Interval Summary  .    No acute overnight events. Echo done yesterday with drop in EF to 45%. No new or acute complaints.  Vital Signs .    Vitals:   04/04/23 2338 04/04/23 2351 04/05/23 0346 04/05/23 0609  BP: (!) 141/92 (!) 141/92 131/82 (!) 140/97  Pulse: 96  80   Resp: 18  17   Temp: 98 F (36.7 C)  97.7 F (36.5 C)   TempSrc: Oral  Oral   SpO2: 95%  94%   Weight:   89 kg   Height:        Intake/Output Summary (Last 24 hours) at 04/05/2023 0902 Last data filed at 04/05/2023 0346 Gross per 24 hour  Intake 340 ml  Output 1002 ml  Net -662 ml      04/05/2023    3:46 AM 04/04/2023    4:36 AM 04/03/2023   10:19 PM  Last 3 Weights  Weight (lbs) 196 lb 4.8 oz 199 lb 1.6 oz 199 lb  Weight (kg) 89.041 kg 90.311 kg 90.266 kg      Telemetry/ECG    Atrial fibrillation, mainly rate controlled, some brief periods >110bpm - Personally Reviewed  Physical Exam .   GEN: No acute distress.   Neck: No JVD Cardiac: Normal rate, irregular rhythm. Respiratory: Clear to auscultation bilaterally. GI: Soft, nontender, non-distended  MS: No edema  04/04/23 Echo:   1. Left ventricular ejection fraction, by estimation, is 45 to 50%. The  left ventricle has mildly decreased function. The left ventricle  demonstrates global hypokinesis. Left ventricular diastolic parameters are  indeterminate.   2. Right ventricular systolic function is low normal. The right  ventricular size is normal.   3. The mitral valve is grossly normal. No evidence of mitral valve  regurgitation. No evidence of mitral stenosis.   4. The aortic valve is tricuspid. Aortic valve regurgitation is not  visualized. Aortic valve sclerosis is present, with no evidence of aortic  valve stenosis.   5. Aortic dilatation noted. Aneurysm of the ascending aorta, measuring 49  mm. There is mild dilatation of the  aortic root, measuring 42 mm.   6. The inferior vena cava is dilated in size with >50% respiratory  variability, suggesting right atrial pressure of 8 mmHg.    Assessment & Plan .     #.  Paroxysmal atrial fibrillation: Last episode occurred following thoracic aortic aneurysm repair. #.  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 Exact timeframe of his current episode is unclear, but patient thinks sometime within the past 6 to 8 weeks.  He does endorse palpitations but appears to be minimally symptomatic.  He was started on IV diltiazem and heparin in the ED for rate control. Echocardiogram was performed 3/29 with drop in EF from 55% in January to 45% in AF.  -Due to EF of 45% we will transition diltiazem to oral metoprolol.  -Continue Eliquis. -Due to drop in EF to 45% because of AF, we will keep inpatient and pursue TEE and DCCV tomorrow rather than bringing back for outpatient DCCV in 3-4 weeks.    For questions or updates, please contact [facility] Please consult [email or web address] for contact info under        Signed, Nobie Putnam, MD

## 2023-04-05 NOTE — Plan of Care (Cosign Needed)

## 2023-04-05 NOTE — H&P (View-Only) (Signed)
   Patient Name: Jason Abbott. Date of Encounter: 04/05/2023 [facility] Cardiologist: None   Interval Summary  .    No acute overnight events. Echo done yesterday with drop in EF to 45%. No new or acute complaints.  Vital Signs .    Vitals:   04/04/23 2338 04/04/23 2351 04/05/23 0346 04/05/23 0609  BP: (!) 141/92 (!) 141/92 131/82 (!) 140/97  Pulse: 96  80   Resp: 18  17   Temp: 98 F (36.7 C)  97.7 F (36.5 C)   TempSrc: Oral  Oral   SpO2: 95%  94%   Weight:   89 kg   Height:        Intake/Output Summary (Last 24 hours) at 04/05/2023 0902 Last data filed at 04/05/2023 0346 Gross per 24 hour  Intake 340 ml  Output 1002 ml  Net -662 ml      04/05/2023    3:46 AM 04/04/2023    4:36 AM 04/03/2023   10:19 PM  Last 3 Weights  Weight (lbs) 196 lb 4.8 oz 199 lb 1.6 oz 199 lb  Weight (kg) 89.041 kg 90.311 kg 90.266 kg      Telemetry/ECG    Atrial fibrillation, mainly rate controlled, some brief periods >110bpm - Personally Reviewed  Physical Exam .   GEN: No acute distress.   Neck: No JVD Cardiac: Normal rate, irregular rhythm. Respiratory: Clear to auscultation bilaterally. GI: Soft, nontender, non-distended  MS: No edema  04/04/23 Echo:   1. Left ventricular ejection fraction, by estimation, is 45 to 50%. The  left ventricle has mildly decreased function. The left ventricle  demonstrates global hypokinesis. Left ventricular diastolic parameters are  indeterminate.   2. Right ventricular systolic function is low normal. The right  ventricular size is normal.   3. The mitral valve is grossly normal. No evidence of mitral valve  regurgitation. No evidence of mitral stenosis.   4. The aortic valve is tricuspid. Aortic valve regurgitation is not  visualized. Aortic valve sclerosis is present, with no evidence of aortic  valve stenosis.   5. Aortic dilatation noted. Aneurysm of the ascending aorta, measuring 49  mm. There is mild dilatation of the  aortic root, measuring 42 mm.   6. The inferior vena cava is dilated in size with >50% respiratory  variability, suggesting right atrial pressure of 8 mmHg.    Assessment & Plan .     #.  Paroxysmal atrial fibrillation: Last episode occurred following thoracic aortic aneurysm repair. #.  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 Exact timeframe of his current episode is unclear, but patient thinks sometime within the past 6 to 8 weeks.  He does endorse palpitations but appears to be minimally symptomatic.  He was started on IV diltiazem and heparin in the ED for rate control. Echocardiogram was performed 3/29 with drop in EF from 55% in January to 45% in AF.  -Due to EF of 45% we will transition diltiazem to oral metoprolol.  -Continue Eliquis. -Due to drop in EF to 45% because of AF, we will keep inpatient and pursue TEE and DCCV tomorrow rather than bringing back for outpatient DCCV in 3-4 weeks.    For questions or updates, please contact [facility] Please consult [email or web address] for contact info under        Signed, Nobie Putnam, MD

## 2023-04-05 NOTE — Plan of Care (Signed)

## 2023-04-06 ENCOUNTER — Inpatient Hospital Stay (HOSPITAL_COMMUNITY)

## 2023-04-06 ENCOUNTER — Encounter (HOSPITAL_COMMUNITY): Payer: Self-pay | Admitting: Internal Medicine

## 2023-04-06 ENCOUNTER — Encounter (HOSPITAL_COMMUNITY)
Admission: EM | Disposition: A | Discharge status: Home / Self Care | Payer: Self-pay | Source: Home / Self Care | Attending: Family Medicine

## 2023-04-06 ENCOUNTER — Other Ambulatory Visit (HOSPITAL_COMMUNITY): Payer: Self-pay

## 2023-04-06 ENCOUNTER — Inpatient Hospital Stay (HOSPITAL_COMMUNITY): Admitting: Anesthesiology

## 2023-04-06 ENCOUNTER — Telehealth (HOSPITAL_COMMUNITY): Payer: Self-pay | Admitting: Pharmacy Technician

## 2023-04-06 DIAGNOSIS — Z7901 Long term (current) use of anticoagulants: Secondary | ICD-10-CM | POA: Diagnosis not present

## 2023-04-06 DIAGNOSIS — C9110 Chronic lymphocytic leukemia of B-cell type not having achieved remission: Secondary | ICD-10-CM | POA: Diagnosis not present

## 2023-04-06 DIAGNOSIS — I4891 Unspecified atrial fibrillation: Secondary | ICD-10-CM | POA: Diagnosis not present

## 2023-04-06 DIAGNOSIS — I10 Essential (primary) hypertension: Secondary | ICD-10-CM | POA: Diagnosis not present

## 2023-04-06 DIAGNOSIS — E782 Mixed hyperlipidemia: Secondary | ICD-10-CM

## 2023-04-06 DIAGNOSIS — I2510 Atherosclerotic heart disease of native coronary artery without angina pectoris: Secondary | ICD-10-CM | POA: Diagnosis not present

## 2023-04-06 DIAGNOSIS — I361 Nonrheumatic tricuspid (valve) insufficiency: Secondary | ICD-10-CM | POA: Diagnosis not present

## 2023-04-06 HISTORY — PX: TRANSESOPHAGEAL ECHOCARDIOGRAM (CATH LAB): EP1270

## 2023-04-06 HISTORY — PX: CARDIOVERSION: EP1203

## 2023-04-06 LAB — CBC
HCT: 41.9 % (ref 39.0–52.0)
Hemoglobin: 14.1 g/dL (ref 13.0–17.0)
MCH: 32.1 pg (ref 26.0–34.0)
MCHC: 33.7 g/dL (ref 30.0–36.0)
MCV: 95.4 fL (ref 80.0–100.0)
Platelets: 90 10*3/uL — ABNORMAL LOW (ref 150–400)
RBC: 4.39 MIL/uL (ref 4.22–5.81)
RDW: 16.7 % — ABNORMAL HIGH (ref 11.5–15.5)
WBC: 8.1 10*3/uL (ref 4.0–10.5)
nRBC: 0 % (ref 0.0–0.2)

## 2023-04-06 LAB — ECHO TEE

## 2023-04-06 SURGERY — TRANSESOPHAGEAL ECHOCARDIOGRAM (TEE) (CATHLAB)
Anesthesia: General

## 2023-04-06 MED ORDER — METOPROLOL TARTRATE 25 MG PO TABS
25.0000 mg | ORAL_TABLET | Freq: Four times a day (QID) | ORAL | 2 refills | Status: DC
  Filled 2023-04-06: qty 60, 15d supply, fill #0

## 2023-04-06 MED ORDER — APIXABAN 5 MG PO TABS
5.0000 mg | ORAL_TABLET | Freq: Two times a day (BID) | ORAL | 2 refills | Status: DC
Start: 2023-04-06 — End: 2023-06-09
  Filled 2023-04-06: qty 60, 30d supply, fill #0

## 2023-04-06 MED ORDER — PREDNISONE 10 MG PO TABS: 10.0000 mg | ORAL_TABLET | Freq: Every day | ORAL | Status: DC

## 2023-04-06 MED ORDER — PROPOFOL 500 MG/50ML IV EMUL
INTRAVENOUS | Status: DC | PRN
Start: 2023-04-06 — End: 2023-04-06
  Administered 2023-04-06: 100 ug/kg/min via INTRAVENOUS

## 2023-04-06 MED ORDER — SODIUM CHLORIDE 0.9 % IV SOLN: INTRAVENOUS | Status: DC | PRN

## 2023-04-06 MED ORDER — SODIUM CHLORIDE 0.9 % IV SOLN: INTRAVENOUS | Status: DC

## 2023-04-06 MED ORDER — LIDOCAINE HCL (PF) 2 % IJ SOLN
INTRAMUSCULAR | Status: DC | PRN
  Administered 2023-04-06: 80 mg via INTRADERMAL

## 2023-04-06 MED ORDER — PROPOFOL 10 MG/ML IV BOLUS
INTRAVENOUS | Status: DC | PRN
  Administered 2023-04-06: 30 mg via INTRAVENOUS
  Administered 2023-04-06: 20 mg via INTRAVENOUS

## 2023-04-06 MED ORDER — PHENYLEPHRINE HCL (PRESSORS) 10 MG/ML IV SOLN
INTRAVENOUS | Status: DC | PRN
Start: 2023-04-06 — End: 2023-04-06
  Administered 2023-04-06: 16 ug via INTRAVENOUS
  Administered 2023-04-06: 160 ug via INTRAVENOUS

## 2023-04-06 SURGICAL SUPPLY — 1 items: PAD DEFIB RADIO PHYSIO CONN (PAD) ×1 IMPLANT

## 2023-04-06 NOTE — Plan of Care (Signed)

## 2023-04-06 NOTE — Anesthesia Postprocedure Evaluation (Signed)
 Anesthesia Post Note  Patient: Jason Abbott.  Procedure(s) Performed: TRANSESOPHAGEAL ECHOCARDIOGRAM CARDIOVERSION     Patient location during evaluation: PACU Anesthesia Type: General Level of consciousness: sedated and patient cooperative Pain management: pain level controlled Vital Signs Assessment: post-procedure vital signs reviewed and stable Respiratory status: spontaneous breathing Cardiovascular status: stable Anesthetic complications: no   No notable events documented.  Last Vitals:  Vitals:   04/06/23 1254 04/06/23 1256  BP: 90/65 95/63  Pulse: 67 (!) 59  Resp: 15 13  Temp:  36.7 C  SpO2: 92% 93%    Last Pain:  Vitals:   04/06/23 1256  TempSrc: Temporal  PainSc: 0-No pain                 Lewie Loron

## 2023-04-06 NOTE — Discharge Instructions (Signed)
 Jason Abbott.,  You were in the hospital with atrial fibrillation, which improved with medication. The heart doctor decided to shock your heart to get it back into a normal rhythm. You will discharge on a beta blocker, to help with heart rate, and a blood thinner, to reduce stroke risk. Please follow-up with your PCP and the cardiologist.

## 2023-04-06 NOTE — Interval H&P Note (Signed)
 History and Physical Interval Note:  04/06/2023 11:02 AM  Jason Abbott.  has presented today for surgery, with the diagnosis of afib.  The various methods of treatment have been discussed with the patient and family. After consideration of risks, benefits and other options for treatment, the patient has consented to  Procedure(s): TRANSESOPHAGEAL ECHOCARDIOGRAM (N/A) CARDIOVERSION (N/A) as a surgical intervention.  The patient's history has been reviewed, patient examined, no change in status, stable for surgery.  I have reviewed the patient's chart and labs.  Questions were answered to the patient's satisfaction.    NPO for TEE/DCCV. On eliquis.   Gerri Spore T. Flora Lipps, MD, [facility]  [facility]  [address] [place], [place] [address] [phone]  11:02 AM

## 2023-04-06 NOTE — Progress Notes (Addendum)
 Patient Name: Jason Abbott. Date of Encounter: 04/06/2023  Primary Cardiologist: None Electrophysiologist: None  Interval Summary   The patient is doing well today.  At this time, the patient denies chest pain, shortness of breath, or any new concerns.  Vital Signs    Vitals:   04/06/23 0104 04/06/23 0517 04/06/23 0557 04/06/23 0720  BP: (!) 129/91 117/82 117/82 104/81  Pulse: 87 75 77 79  Resp:  17  18  Temp:  97.7 F (36.5 C)  (!) 97.5 F (36.4 C)  TempSrc:  Oral  Oral  SpO2:  100%  94%  Weight:  87.5 kg    Height:        Intake/Output Summary (Last 24 hours) at 04/06/2023 0802 Last data filed at 04/06/2023 0519 Gross per 24 hour  Intake 240 ml  Output 800 ml  Net -560 ml   Filed Weights   04/04/23 0436 04/05/23 0346 04/06/23 0517  Weight: 90.3 kg 89 kg 87.5 kg    Physical Exam    GEN- The patient is well appearing, alert and oriented x 3 today.   Lungs- Clear to ausculation bilaterally, normal work of breathing Cardiac- Irregularly irregular rate and rhythm, no murmurs, rubs or gallops GI- soft, NT, ND, + BS Extremities- no clubbing or cyanosis. No edema  Telemetry    AF 70-105 (personally reviewed)  Hospital Course    Rihaan Barrack. is a 78 y.o. male with PMH of paroxysmal atrial fibrillation S/P thoracic aortic aneurysm repair, chronic RBBB, CLL, hyperlipidemia, previous CVA, BPH, peptic ulcer disease, autoimmune hemolytic anemia admitted 04/03/23 from his ONC office with a rapid HR. He was found to be in [facility].    Studies:  ECHO 01/23/23 > LVEF 50-55%, G1DD  ECHO 04/04/23 > LVEF 45-50%   Arrhythmia / AAD AF    Assessment & Plan    Atrial Fibrillation Initially with RVR, now rate controlled, likely occurred in last 6-8 weeks, minimally symptomatic. Last episode occurred after thoracic aortic aneurysm repair. CHA2DS2-VASc 3. ECHO with new reduction of LVEF.   -pending TEE with DCCV  -continue eliquis for stroke prophylaxis, no  interruptions of OAC for 4 weeks post DCCV -follow up scheduled for 04/17/23 with Dr. Jimmey Ralph  -from EP perspective, he will be able to go home after his cardioversion  -continue beta blocker, lopressor 25 mg Q6, anticipate consolidate to Toprol for ease of dosing  Secondary Hypercoagulable State  -continue Eliquis, dose reviewed and appropriate by age / wt    All other medical issues per TRH / ONC.     For questions or updates, please contact [facility] Please consult [email or web address] for contact info under Cardiology/STEMI.  Signed, Canary Brim, NP-C, AGACNP-BC [facility] - Electrophysiology  04/06/2023, 8:02 AM   I have seen, examined the patient, and reviewed the above assessment and plan.    Interval: No acute overnight events.  Remains in rate controlled atrial fibrillation on telemetry.  Patient reports feeling relatively well.  No new or acute complaints today.  GEN: No acute distress.   Cardiac: Normal rate, irregular rhythm. Psych: Normal affect   Assessment and plan: #.  Paroxysmal atrial fibrillation: Last episode occurred following thoracic aortic aneurysm repair. #.  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 Exact timeframe of his current episode is unclear, but patient thinks sometime within the past 6 to 8 weeks.  He does endorse palpitations but appears to be minimally symptomatic.  He was started  on IV diltiazem and heparin in the ED for rate control. Echocardiogram was performed 3/29 with drop in EF from 55% in January to 45% in AF.  Due to drop in EF, we prioritized restoration of sinus rhythm this hospital stay rather than discharged on anticoagulation returning in 3 to 4 weeks for cardioversion.  Patient underwent TEE and cardioversion to sinus rhythm on 04/06/2023. -Discharge on metoprolol 25 mg twice daily. -Continue Eliquis. -Follow-up with me in clinic on 4/11.  We will see if patient has maintained sinus  rhythm and discuss long-term options for atrial fibrillation management at that visit.   Nobie Putnam, MD 04/06/2023 11:42 PM

## 2023-04-06 NOTE — Anesthesia Preprocedure Evaluation (Addendum)
 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 Left ventricular ejection fraction, by estimation, is 45 to 50%. The left ventricle has mildly decreased function. The left ventricle demonstrates global hypokinesis. Left ventricular diastolic parameters are indeterminate.   2. Right ventricular systolic function is low normal. The right ventricular size is normal.   3. The mitral valve is grossly normal. No evidence of mitral valve regurgitation. No evidence of mitral stenosis.   4. The aortic valve is tricuspid. Aortic valve regurgitation is not visualized. Aortic valve sclerosis is present, with no evidence of aortic valve stenosis.   5. Aortic dilatation noted. Aneurysm of the ascending aorta, measuring 49mm. There is mild dilatation of the aortic root, measuring 42mm.   6. The inferior vena cava is dilated in size with >50% respiratory variability, suggesting right atrial pressure of 8 mmHg.   Comparison(s): Prior images reviewed side by side. LVEF is less vigorous. Aortic dimensions have notably increased.    Echo 01/2023  1. Left ventricular ejection fraction, by estimation, is 50 to 55%. The left ventricle has low normal function. The left ventricle has no regional wall  motion abnormalities. There is mild left ventricular hypertrophy. Left ventricular diastolic parameters are consistent with Grade I diastolic dysfunction (impaired relaxation). There is abnormal septal motion likely secondary to conduction delay.   2. Right ventricular systolic function is mildly reduced. The right ventricular size is normal. There is normal pulmonary artery systolic pressure. The estimated right ventricular systolic pressure is 27.0 mmHg.  3. Left atrial size was mildly dilated.   4. The mitral valve is grossly normal. Trivial mitral valve regurgitation. No evidence of mitral stenosis.   5. The aortic valve is tricuspid. There is mild calcification of the aortic valve. There is mild thickening of the aortic valve. Aortic valve regurgitation is trivial. No aortic stenosis is present.   6. Aortic dilatation noted. There is borderline dilatation of the aortic root, measuring 42 mm.   7. The inferior vena cava is normal in size with greater than 50% respiratory variability, suggesting right atrial pressure of 3 mmHg.   8. Cannot exclude a small PFO.    09/2021 ECHO:  EF 55-60%, LVF normal, RVF normal, no significant valvular abnormalities, 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   Neuro/Psych  Headaches PSYCHIATRIC DISORDERS         GI/Hepatic Neg liver ROS, PUD,GERD  Controlled,,  Endo/Other  negative endocrine ROS    Renal/GU Renal disease     Musculoskeletal  (+) Arthritis ,    Abdominal   Peds  Hematology CLL   Anesthesia Other Findings   Reproductive/Obstetrics                             Anesthesia Physical Anesthesia Plan  ASA: 4  Anesthesia Plan: General   Post-op Pain Management: Tylenol PO (pre-op)*   Induction: Intravenous  PONV Risk Score and Plan: 2 and Ondansetron and Dexamethasone  Airway Management Planned: Oral ETT  Additional Equipment: Arterial line, CVP, TEE and Ultrasound Guidance Line Placement  Intra-op Plan:    Post-operative Plan: Possible Post-op intubation/ventilation  Informed Consent: I have reviewed the patients History and Physical, chart, labs and discussed the procedure including the risks, benefits and alternatives for the proposed anesthesia with the patient or authorized representative who has indicated his/her understanding and acceptance.     Dental advisory given  Plan Discussed with: CRNA and Surgeon  Anesthesia Plan Comments:         Anesthesia Quick Evaluation

## 2023-04-06 NOTE — Telephone Encounter (Signed)
 Patient Product/process development scientist completed.    The patient is insured through [employer].     Ran test claim for Entresto 24-26 mg and the current 30 day co-pay is $16.00.  Ran test claim for Jardiance 10 mg and the current 30 day co-pay is $43.00.  Ran test claim for Farxgia 10mg  and Requires Prior Authorization  This test claim was processed through [employer]- copay amounts may vary at other pharmacies due to [employer], or as the patient moves through the different stages of their insurance plan.     Roland Earl, CPHT Pharmacy Technician III Certified Patient Advocate [facility] Patient Advocate Team Direct Number: [phone]  Fax: [phone]

## 2023-04-06 NOTE — CV Procedure (Signed)
   TRANSESOPHAGEAL ECHOCARDIOGRAM GUIDED DIRECT CURRENT CARDIOVERSION  NAME:  Jason Abbott.    MRN: [id number] DOB:  03-Dec-1945    ADMIT DATE: 04/03/2023  INDICATIONS: Symptomatic atrial fibrillation   PROCEDURE:   Informed consent was obtained prior to the procedure. The risks, benefits and alternatives for the procedure were discussed and the patient comprehended these risks.  Risks include, but are not limited to, cough, sore throat, vomiting, nausea, somnolence, esophageal and stomach trauma or perforation, bleeding, low blood pressure, aspiration, pneumonia, infection, trauma to the teeth and death.    After a procedural time-out, the oropharynx was anesthetized and the patient was sedated by the anesthesia service. The transesophageal probe was inserted in the esophagus and stomach without difficulty and multiple views were obtained. Anesthesia was monitored by Dr. Twanna Hy.   COMPLICATIONS:    Complications: No complications Patient tolerated procedure well.  KEY FINDINGS:  No LAA thrombus.  LVEF 40-45% Full Report to follow.   CARDIOVERSION:     Indications:  Symptomatic Atrial Fibrillation  Procedure Details:  Once the TEE was complete, the patient had the defibrillator pads placed in the anterior and posterior position. Once an appropriate level of sedation was confirmed, the patient was cardioverted x 1 with 200J of biphasic synchronized energy.  The patient converted to NSR.  There were no apparent complications.  The patient had normal neuro status and respiratory status post procedure with vitals stable as recorded elsewhere.  Adequate airway was maintained throughout and vital signs monitored per protocol.  Gerri Spore T. Flora Lipps, MD [facility]  [address] [place], [place] [address] [phone]  12:44 PM

## 2023-04-06 NOTE — TOC Initial Note (Addendum)
 Transition of Care (TOC) - Initial/Assessment Note    Patient Details  Name: Jason Abbott. MRN: [id number] Date of Birth: [birth year]  Transition of Care [facility]) CM/SW Contact:    Leone Haven, RN Phone Number: 04/06/2023, 4:00 PM  Clinical Narrative:                 From home with spouse, has PCP and insurance on file, states has no HH services in place at this time or DME at home.  States family member will transport them home at [employer] and family is support system, states [facility] [address] [facility] [place].  Pta self ambulatory . He will be on eliquis , copay for refills is 43.00, informed patient of this information.  Patient gave this NCM permission to speak with wife.  Expected Discharge Plan: Home/Self Care Barriers to Discharge: Continued Medical Work up   Patient Goals and CMS Choice Patient states their goals for this hospitalization and ongoing recovery are:: return home   Choice offered to / list presented to : NA      Expected Discharge Plan and Services In-house Referral: NA Discharge Planning Services: CM Consult Post Acute Care Choice: NA Living arrangements for the past 2 months: Single Family Home                   DME Agency: NA       HH Arranged: NA          Prior Living Arrangements/Services Living arrangements for the past 2 months: Single Family Home Lives with:: Spouse Patient language and need for interpreter reviewed:: Yes Do you feel safe going back to the place where you live?: Yes      Need for Family Participation in Patient Care: Yes (Comment) Care giver support system in place?: Yes (comment)   Criminal Activity/Legal Involvement Pertinent to Current Situation/Hospitalization: No - Comment as needed  Activities of Daily Living   ADL Screening (condition at time of admission) Independently performs ADLs?: Yes (appropriate for developmental age) Is the patient deaf or have difficulty hearing?: No Does  the patient have difficulty seeing, even when wearing glasses/contacts?: No Does the patient have difficulty concentrating, remembering, or making decisions?: No  Permission Sought/Granted Permission sought to share information with : Case Manager, Family Supports Permission granted to share information with : Yes, Verbal Permission Granted  Share Information with NAME: Rehman Levinson     Permission granted to share info w Relationship: wife  Permission granted to share info w Contact Information: [phone]  Emotional Assessment Appearance:: Appears stated age Attitude/Demeanor/Rapport: Engaged Affect (typically observed): Appropriate Orientation: : Oriented to Self, Oriented to Place, Oriented to  Time, Oriented to Situation Alcohol / Substance Use: Not Applicable Psych Involvement: No (comment)  Admission diagnosis:  Atrial fibrillation with RVR (HCC) [I48.91] Paroxysmal atrial fibrillation with RVR (HCC) [I48.0] Patient Active Problem List   Diagnosis Date Noted   Atrial fibrillation with rapid ventricular response (HCC) 04/03/2023   Atrial fibrillation with RVR (HCC) 04/03/2023   Paroxysmal atrial fibrillation with RVR (HCC) 04/03/2023   Hallucination, prednisone induced 10/22/2022   Recurrent UTI 07/24/2022   Atrial fibrillation (HCC) 01/21/2022   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/17/2021   Hyponatremia 09/13/2021   Hypoalbuminemia 09/13/2021   BPH (benign prostatic hyperplasia) 09/13/2021   Thrombocytopenia (HCC) 09/09/2021   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   Ingrown right greater toenail 08/12/2021   Right  hand pain 03/11/2021   Tinnitus 03/11/2021   Former smoker 12/26/2020   Renal cyst 12/26/2020   Elevated TSH 09/12/2020   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 [place] [address] Phone: [phone] Fax: [phone]  [facility] [address] [place] [address] Phone: [phone] Fax: [phone]  [facility] - [place], [place] - [address] [place] [address] Phone: [phone] Fax: [phone]  [id number] [place], [place] - [address] [place] [address] Phone: [phone] Fax: [phone]  [facility] [phone] - [place], [place] - [address] [facility] [address] [place] [address] Phone: [phone] Fax: [phone]     Social Drivers of Health (SDOH) Social History: SDOH Screenings   Food Insecurity: No Food Insecurity (04/03/2023)  Housing: Low Risk  (04/03/2023)  Transportation Needs: No Transportation Needs (04/03/2023)  Utilities: Not At Risk (04/03/2023)  Alcohol Screen: Low Risk  (05/14/2022)  Depression (PHQ2-9): Low Risk  (11/05/2022)  Financial Resource Strain: Low Risk  (11/04/2022)  Physical Activity: Insufficiently Active (11/04/2022)  Social Connections: Unknown (04/03/2023)  Stress: No Stress Concern Present (11/04/2022)  Tobacco Use: Medium Risk (04/06/2023)   SDOH Interventions:     Readmission Risk Interventions    04/06/2023    3:57 PM 09/13/2021   11:06 AM  Readmission Risk Prevention Plan  Transportation Screening Complete Complete  PCP or Specialist Appt within 5-7 Days Complete Complete  Home Care Screening Complete  Complete  Medication Review (RN CM) Complete Complete

## 2023-04-06 NOTE — Discharge Summary (Signed)
 Physician Discharge Summary   Patient: Jason Abbott. MRN: [id number] DOB: 05-Nov-1945  Admit date:     04/03/2023  Discharge date: 04/06/23  Discharge Physician: Jacquelin Hawking, MD   PCP: Monica Becton, MD   Recommendations at discharge:  PCP visit for hospital follow-up Cardiology visit for hospital follow-up  Discharge Diagnoses: Principal Problem:   Atrial fibrillation with RVR (HCC) Active Problems:   CLL (chronic lymphocytic leukemia) (HCC)   Hyperlipidemia, mixed   History of stroke involving cerebellum   BPH (benign prostatic hyperplasia)   Peptic ulcer disease   Paroxysmal atrial fibrillation with RVR (HCC)  Resolved Problems:   * No resolved hospital problems. *  Hospital Course: Jason Abbott. is a 78 y.o. male with a history of paroxysmal atrial fibrillation, CLL, hyperlipidemia, CVA, BPH, PUD, autoimmune hemolytic anemia.  Patient presented secondary to atrial fibrillation and started on diltiazem and heparin IV. Cardiology consulted. Transthoracic Echocardiogram obtained and was significant for reduced LVEF, necessitating transition from Cardizem to metoprolol. Heparin transitioned to Eliquis. Patient underwent Transesophageal Echocardiogram with cardioversion and reversion to sinus rhythm prior to discharge.  Assessment and Plan:  Atrial fibrillation with RVR Recurrent. Prior history previously on Eliquis. Patient without significant symptoms. Patient started on Cardizem IV and Heparin IV with improved rate control. Cardiology consulted and Transthoracic Echocardiogram ordered and significant for LVEF of 45-50% with global hypokinesis and indeterminate diastolic parameters. Patient transitioned to Cardizem PO and Eliquis, however Cardizem changed to metoprolol secondary to reduced LVEF.  Patient underwent successful Transesophageal Echocardiogram with cardioversion and reversion to sinus rhythm. Cardiology follow-up on discharge.   History of  CLL Autoimmune hemolytic anemia Patient follows with oncology as an outpatient. Continue prednisone, folic acid, famciclovir, diflucan.   Hyperlipidemia Noted. Not on lipid lowering therapy.   BPH Continue tamsulosin and finasteride   Peptic ulcer disease Noted. Not on pharmacologic therapy. Start Protonix on discharge.   Thrombocytopenia Chronic.   HFrEF Unclear chronicity. Noted on Transthoracic Echocardiogram. 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 Cardiology follow-up.  History of CVA Noted.   Consultants:  Cardiology   Procedures:  Transesophageal Echocardiogram/Cardioversion  Disposition: Home Diet recommendation: Cardiac diet   DISCHARGE MEDICATION: Allergies as of 04/06/2023       Reactions   Levaquin [levofloxacin] Other (See Comments)   Hx of aortic aneurysm, has since been repaired.        Medication List     STOP taking these medications    aspirin EC 81 MG tablet       TAKE these medications    apixaban 5 MG Tabs tablet Commonly known as: ELIQUIS Take 1 tablet (5 mg total) by mouth 2 (two) times daily.   AZO CRANBERRY PO Take 1 capsule by mouth daily with supper.   cephALEXin 250 MG capsule Commonly known as: KEFLEX Take 1 capsule (250 mg total) by mouth daily. What changed: when to take this   cyanocobalamin 1000 MCG tablet Commonly known as: VITAMIN B12 Take 1,000 mcg by mouth daily.   famciclovir 250 MG tablet Commonly known as: FAMVIR TAKE 1 TABLET BY MOUTH TWICE A DAY What changed:  when to take this additional instructions   ferrous sulfate 325 (65 FE) MG tablet Take 325 mg by mouth daily.   finasteride 5 MG tablet Commonly known as: PROSCAR Take 1 tablet (5 mg total) by mouth daily. What changed: when  to take this   fluconazole 100 MG tablet Commonly known as: DIFLUCAN Take 1  tablet (100 mg total) by mouth daily. What changed: when to take this   folic acid 1 MG tablet Commonly known as: FOLVITE Take 2 tablets (2 mg total) by mouth daily. What changed:  how much to take when to take this additional instructions   Mens Multivitamin Gummies Chew Chew 2 each by mouth daily.   metoprolol tartrate 25 MG tablet Commonly known as: LOPRESSOR Take 1 tablet (25 mg total) by mouth every 6 (six) hours.   ondansetron 8 MG tablet Commonly known as: ZOFRAN Take 8 mg by mouth every 8 (eight) hours as needed for nausea or vomiting.   predniSONE 10 MG tablet Commonly known as: DELTASONE Take 1 tablet (10 mg total) by mouth daily with breakfast.   tamsulosin 0.4 MG Caps capsule Commonly known as: FLOMAX Take 1 capsule (0.4 mg total) by mouth in the morning and at bedtime.   Unisom Sleepgels 50 MG Caps Generic drug: diphenhydrAMINE HCl (Sleep) Take 50 mg by mouth at bedtime.   Vitamin D3 125 MCG (5000 UT) Caps Take 5,000 Units by mouth daily with supper.        Discharge Exam: BP 104/81 (BP Location: Left Arm)   Pulse 79   Temp (!) 97.5 F (36.4 C) (Oral)   Resp 18   Ht 5\' 10"  (1.778 m)   Wt 87.5 kg   SpO2 94%   BMI 27.68 kg/m   General exam: Appears calm and comfortable Respiratory system: Clear to auscultation. Respiratory effort normal. Cardiovascular system: S1 & S2 heard, irregular rhythm with normal rate. Gastrointestinal system: Abdomen is nondistended, soft and nontender. Normal bowel sounds heard. Central nervous system: Alert and oriented. No focal neurological deficits. Musculoskeletal: No edema. No calf tenderness Psychiatry: Judgement and insight appear normal. Mood & affect appropriate.   Condition at discharge: stable  The results of significant diagnostics from this hospitalization (including imaging, microbiology, ancillary and laboratory) are listed below for reference.   Imaging Studies: ECHOCARDIOGRAM COMPLETE Result  Date: 04/04/2023    ECHOCARDIOGRAM REPORT   Patient Name:   Jason Abbott. Date of Exam: 04/04/2023 Medical Rec #:  [id number]           Height:       70.0 in Accession #:    [id number]          Weight:       199.1 lb Date of Birth:  05/28/1945           BSA:          2.083 m Patient Age:    78 years            BP:           127/93 mmHg Patient Gender: M                   HR:           82 bpm. Exam Location:  Inpatient Procedure: 2D Echo, Cardiac Doppler and Color Doppler (Both Spectral and Color            Flow Doppler were utilized during procedure). Indications:    Atrial Fibrillation I48.91  History:        Patient has prior history of Echocardiogram examinations, most                 recent 01/23/2023. Arrythmias:Atrial Fibrillation; Risk  Factors:Dyslipidemia and Former Smoker. Aortic Arch Aneurysm.  Sonographer:    Lucendia Herrlich RCS Referring Phys: [id number] MOHAMMAD L GARBA IMPRESSIONS  1. Left ventricular ejection fraction, by estimation, is 45 to 50%. The left ventricle has mildly decreased function. The left ventricle demonstrates global hypokinesis. Left ventricular diastolic parameters are indeterminate.  2. Right ventricular systolic function is low normal. The right ventricular size is normal.  3. The mitral valve is grossly normal. No evidence of mitral valve regurgitation. No evidence of mitral stenosis.  4. The aortic valve is tricuspid. Aortic valve regurgitation is not visualized. Aortic valve sclerosis is present, with no evidence of aortic valve stenosis.  5. Aortic dilatation noted. Aneurysm of the ascending aorta, measuring 49 mm. There is mild dilatation of the aortic root, measuring 42 mm.  6. The inferior vena cava is dilated in size with >50% respiratory variability, suggesting right atrial pressure of 8 mmHg. Comparison(s): Prior images reviewed side by side. LVEF is less vigorous. Aortic dimensions have notably increased. FINDINGS  Left Ventricle: Left ventricular  ejection fraction, by estimation, is 45 to 50%. The left ventricle has mildly decreased function. The left ventricle demonstrates global hypokinesis. Strain was performed and the global longitudinal strain is indeterminate. The left ventricular internal cavity size was normal in size. There is no left ventricular hypertrophy. Left ventricular diastolic parameters are indeterminate. Right Ventricle: The right ventricular size is normal. No increase in right ventricular wall thickness. Right ventricular systolic function is low normal. Left Atrium: Left atrial size was normal in size. Right Atrium: Right atrial size was normal in size. Pericardium: There is no evidence of pericardial effusion. Mitral Valve: The mitral valve is grossly normal. No evidence of mitral valve regurgitation. No evidence of mitral valve stenosis. Tricuspid Valve: The tricuspid valve is grossly normal. Tricuspid valve regurgitation is trivial. No evidence of tricuspid stenosis. Aortic Valve: The aortic valve is tricuspid. Aortic valve regurgitation is not visualized. Aortic valve sclerosis is present, with no evidence of aortic valve stenosis. Aortic valve peak gradient measures 3.8 mmHg. Pulmonic Valve: The pulmonic valve was normal in structure. Pulmonic valve regurgitation is not visualized. Aorta: Aortic dilatation noted. There is mild dilatation of the aortic root, measuring 42 mm. There is an aneurysm involving the ascending aorta measuring 49 mm. Venous: The inferior vena cava is dilated in size with greater than 50% respiratory variability, suggesting right atrial pressure of 8 mmHg. IAS/Shunts: The atrial septum is grossly normal. Additional Comments: 3D was performed not requiring image post processing on an independent workstation and was indeterminate.  LEFT VENTRICLE PLAX 2D LVIDd:         5.20 cm   Diastology LVIDs:         3.80 cm   LV e' medial:    6.80 cm/s LV PW:         1.30 cm   LV E/e' medial:  11.5 LV IVS:        1.00 cm    LV e' lateral:   11.70 cm/s LVOT diam:     2.20 cm   LV E/e' lateral: 6.7 LV SV:         48 LV SV Index:   23 LVOT Area:     3.80 cm  RIGHT VENTRICLE            IVC RV S prime:     9.08 cm/s  IVC diam: 2.10 cm TAPSE (M-mode): 1.0 cm LEFT ATRIUM  Index        RIGHT ATRIUM           Index LA diam:      5.50 cm 2.64 cm/m   RA Area:     18.50 cm LA Vol (A2C): 50.5 ml 24.24 ml/m  RA Volume:   35.80 ml  17.18 ml/m LA Vol (A4C): 50.7 ml 24.34 ml/m  AORTIC VALVE AV Area (Vmax): 3.05 cm AV Vmax:        97.90 cm/s AV Peak Grad:   3.8 mmHg LVOT Vmax:      78.47 cm/s LVOT Vmean:     49.100 cm/s LVOT VTI:       0.125 m  AORTA Ao Root diam: 4.20 cm Ao Asc diam:  4.90 cm MITRAL VALVE               TRICUSPID VALVE MV Area (PHT): 5.46 cm    TR Peak grad:   14.7 mmHg MV Decel Time: 139 msec    TR Vmax:        192.00 cm/s MV E velocity: 78.05 cm/s MV A velocity: 36.40 cm/s  SHUNTS MV E/A ratio:  2.14        Systemic VTI:  0.13 m                            Systemic Diam: 2.20 cm Riley Lam MD Electronically signed by Riley Lam MD Signature Date/Time: 04/04/2023/4:19:17 PM    Final    DG Chest Port 1 View Result Date: 04/03/2023 CLINICAL DATA:  Atrial fibrillation. EXAM: PORTABLE CHEST 1 VIEW COMPARISON:  Chest radiograph dated 12/30/2022. FINDINGS: The heart size and mediastinal contours are unchanged with thoracic aortic stent graft in place. Prior median sternotomy. No focal consolidation, sizeable pleural effusion, or pneumothorax. No acute osseous abnormality. IMPRESSION: No acute cardiopulmonary findings. Electronically Signed   By: Hart Robinsons M.D.   On: 04/03/2023 16:02    Microbiology: Results for orders placed or performed in visit on 02/17/23  Urine Culture     Status: None   Collection Time: 02/17/23  2:40 PM   Specimen: Urine, Clean Catch  Result Value Ref Range Status   Specimen Description   Final    URINE, CLEAN CATCH Performed at [facility] at  [facility], [address], [place], [place] [address]    Special Requests   Final    URINE, CLEAN CATCH Performed at [facility] at [facility], [address], [place], [place] [address]    Culture   Final    NO GROWTH Performed at [facility], [address], [place], [place] [address]    Report Status 02/18/2023 FINAL  Final    Labs: CBC: Recent Labs  Lab 04/03/23 1332 04/03/23 1545 04/04/23 0738 04/05/23 0235 04/06/23 0306  WBC 9.7 7.6 8.0 6.5 8.1  NEUTROABS 6.9  --   --   --   --   HGB 15.0 14.2 14.3 14.0 14.1  HCT 45.4 42.6 42.6 42.3 41.9  MCV 96.2 94.0 93.6 94.6 95.4  PLT 97* 93* 92* 93* 90*   Basic Metabolic Panel: Recent Labs  Lab 04/03/23 1332 04/03/23 1545 04/04/23 0738  NA 141 139 140  K 4.3 4.3 3.8  CL 101 102 105  CO2 31 29 25   GLUCOSE 120* 129* 98  BUN 20 21 16   CREATININE 1.21 1.05 1.08  CALCIUM 9.8 9.5 8.8*   Liver Function Tests: Recent Labs  Lab 04/03/23 1332  AST 16  ALT 15  ALKPHOS 58  BILITOT 0.6  PROT 6.3*  ALBUMIN 4.6    Discharge time spent: 35 minutes.  Signed: Jacquelin Hawking, MD [facility] 04/06/2023

## 2023-04-06 NOTE — Consult Note (Signed)
 Jason Abbott is well-known to me.  He is a very nice 78 year old white male.  I have been seeing him for CLL.  He has a history of stage C CLL.  He actually has had 1 treatment which he tolerated incredibly poorly.  This was back in November.  However, it did seem to work quite well.  He now has rapid A-fib.  He was found to have this in the office on 04/03/2023.  He was admitted.  He is going to have a TEE and I think attempted cardioversion.  He was started on heparin.  I think he now is on Eliquis.  His CBC today shows a white count of 8.1.  Hemoglobin 14.1.  Platelet count 90,000.  His electrolytes that were done back on 04/04/2018 fluctuate sodium 140.  Potassium 3.8.  BUN 16 creatinine 1.1.  Calcium 8.8.  His bilirubin is 0.6.  He has had an echocardiogram.  This was done on 04/04/2018.  This showed a decreased LVEF of 45-50%.  The valves looked okay.  He does have an aneurysm of the ascending aorta.  Currently, he is on metoprolol.  He is eating okay.  He has had no nausea or vomiting.  He has had no change in bowel bladder habits.  He has had no rashes.  He has had no swollen lymph nodes.  His vital signs show a temperature of 97.7.  Pulse 77.  Blood pressure 117/82.  His head and neck exam shows no ocular or oral lesions.  There is no adenopathy.  Lungs are clear bilaterally.  Cardiac exam irregular rate and rhythm consistent with atrial fibrillation.  There are no murmurs.  Abdomen is soft.  Bowel sounds are present.  He has no fluid wave.  There is no palpable liver or spleen tip.  Extremity shows no clubbing, cyanosis or edema.  Neurological exam shows no focal neurological deficits.  Skin exam shows some scattered ecchymoses.   Mr. Helderman has atrial fibrillation.  I think this is much more active problem than his CLL.  The CLL is under very good control right now.  I would not think that there would be a problem with respect to his CLL for years.  If we have to treat him, we should be very  successful with treatment.  I do not see any contraindication for doing any invasive procedure for his heart.  He has some mild thrombocytopenia.  However this really should not be a problem with him on Eliquis.  It will be interesting to see if he can cardiovert.  We will follow along and try to help out any way that we can.    Christin Bach, MD  Psalm 16:1

## 2023-04-06 NOTE — Progress Notes (Signed)
 Patient Name: Jason Min. Date of Encounter: 04/06/2023 [facility] Cardiologist: None  04/03/2023 .admit Length of stay: 3  Interval Summary  .    Jason Abbott. is a 78 y.o. Jason Enderle Lelynd Poer. is a 78 y.o. 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  He went to the PCPs office for hematology follow-up and was found to be in A-fib with RVR and recommended ED evaluation.  Patient also had fatigue, not sure whether it was related to A-fib or his underlying CLL.  Patient underwent direct-current cardioversion at 12.5 today and converted back to sinus rhythm.  His wife is present at the bedside, there are no specific complaints, he is feeling well.  Physical Exam    Vitals:   04/06/23 1342 04/06/23 1345 04/06/23 1400 04/06/23 1418  BP: 113/72 99/86 119/79 122/86  Pulse: 63 (!) 56 67 77  Resp:      Temp:      TempSrc:      SpO2: 97% 97% 97%   Weight:      Height:       Physical Exam Neck:     Vascular: No carotid bruit or JVD.  Cardiovascular:     Rate and Rhythm: Normal rate and regular rhythm.     Pulses: Intact distal pulses.     Heart sounds: Normal heart sounds. No murmur heard.    No gallop.  Pulmonary:     Effort: Pulmonary effort is normal.     Breath sounds: Normal breath sounds.  Abdominal:     General: Bowel sounds are normal.     Palpations: Abdomen is soft.  Musculoskeletal:     Right lower leg: No edema.     Left lower leg: No edema.       04/06/2023    5:17 AM 04/05/2023    3:46 AM 04/04/2023    4:36 AM  Last 3 Weights  Weight (lbs) 192 lb 14.4 oz 196 lb 4.8 oz 199 lb 1.6 oz  Weight  (kg) 87.5 kg 89.041 kg 90.311 kg      Labs   Lab Results  Component Value Date   NA 140 04/04/2023   K 3.8 04/04/2023   CO2 25 04/04/2023   GLUCOSE 98 04/04/2023   BUN 16 04/04/2023   CREATININE 1.08 04/04/2023   CALCIUM 8.8 (L) 04/04/2023   EGFR 67 11/05/2022   GFRNONAA >60 04/04/2023       Latest Ref Rng & Units 04/04/2023    7:38 AM 04/03/2023    3:45 PM 04/03/2023    1:32 PM  BMP  Glucose 70 - 99 mg/dL 98  161  096   BUN 8 - 23 mg/dL 16  21  20    Creatinine 0.61 - 1.24 mg/dL 0.45  4.09  8.11   Sodium 135 - 145 mmol/L 140  139  141   Potassium 3.5 - 5.1 mmol/L 3.8  4.3  4.3   Chloride 98 - 111 mmol/L 105  102  101   CO2 22 - 32 mmol/L 25  29  31    Calcium 8.9 - 10.3 mg/dL 8.8  9.5  9.8        Latest Ref Rng &  Units 04/06/2023    3:06 AM 04/05/2023    2:35 AM 04/04/2023    7:38 AM  CBC  WBC 4.0 - 10.5 K/uL 8.1  6.5  8.0   Hemoglobin 13.0 - 17.0 g/dL 40.9  81.1  91.4   Hematocrit 39.0 - 52.0 % 41.9  42.3  42.6   Platelets 150 - 400 K/uL 90  93  92     Lab Results  Component Value Date   CHOL 154 04/04/2023   HDL 41 04/04/2023   LDLCALC 81 04/04/2023   TRIG 158 (H) 04/04/2023   CHOLHDL 3.8 04/04/2023    Lab Results  Component Value Date   TSH 2.372 04/04/2023    Lab Results  Component Value Date   HGBA1C 4.4 (L) 11/05/2022      Intake/Output Summary (Last 24 hours) at 04/06/2023 1626 Last data filed at 04/06/2023 1248 Gross per 24 hour  Intake 620 ml  Output 600 ml  Net 20 ml    Net IO Since Admission: -557.39 mL [04/06/23 1626]   Tele/EKG/Cardiac studies    Telemetry 04/06/2023 at 4:30 PM: Patient is maintaining sinus rhythm with occasional PACs.- Personally Reviewed  EKG:  EKG 04/03/2023: Atrial fibrillation with controlled ventricular sponsor at the rate of 97 bpm, left axis deviation, left anterior fascicular block.  Right bundle branch block.  Bifascicular block.  EKG 04/06/2023 at 12:57 PM: Sinus rhythm with first-degree AV block at the  rate of 63 bpm, left anterior fascicular block.  Right bundle branch block.  Single PAC.  Compared to prior EKG, A-fib not present.  Echocardiogram 04/04/2023:  1. Left ventricular ejection fraction, by estimation, is 45 to 50%. The left ventricle has mildly decreased function. The left ventricle demonstrates global hypokinesis. Left ventricular diastolic parameters are indeterminate.  2. Right ventricular systolic function is low normal. The right ventricular size is normal.  3. The mitral valve is grossly normal. No evidence of mitral valve regurgitation. No evidence of mitral stenosis.  4. The aortic valve is tricuspid. Aortic valve regurgitation is not visualized. Aortic valve sclerosis is present, with no evidence of aortic valve stenosis.  5. Aortic dilatation noted. Aneurysm of the ascending aorta, measuring 49 mm. There is mild dilatation of the aortic root, measuring 42 mm.  6. The inferior vena cava is dilated in size with >50% respiratory variability, suggesting right atrial pressure of 8 mmHg.  Comparison(s): Prior images reviewed side by side. LVEF is less vigorous. Aortic dimensions have notably increased.   Radiology    Current Meds:     Current Facility-Administered Medications:    acetaminophen (TYLENOL) tablet 650 mg, 650 mg, Oral, Q4H PRN, O'Neal, Ronnald Ramp, MD, 650 mg at 04/04/23 1029   apixaban (ELIQUIS) tablet 5 mg, 5 mg, Oral, BID, O'Neal, Ronnald Ramp, MD, 5 mg at 04/06/23 7829   cyanocobalamin (VITAMIN B12) tablet 1,000 mcg, 1,000 mcg, Oral, Daily, O'Neal, Ronnald Ramp, MD, 1,000 mcg at 04/06/23 5621   doxylamine (Sleep) (UNISOM) tablet 50 mg, 50 mg, Oral, QHS PRN, O'Neal, Ronnald Ramp, MD   famciclovir [facility]) tablet 250 mg, 250 mg, Oral, BID, O'Neal, Ronnald Ramp, MD, 250 mg at 04/06/23 3086   finasteride (PROSCAR) tablet 5 mg, 5 mg, Oral, Daily, O'Neal, Ronnald Ramp, MD, 5 mg at 04/06/23 5784   fluconazole (DIFLUCAN) tablet 100 mg, 100 mg, Oral, Daily,  O'Neal, Ronnald Ramp, MD, 100 mg at 04/06/23 6962   folic acid (FOLVITE) tablet 1 mg, 1 mg, Oral, BID, O'Neal, Ronnald Ramp, MD, 1  mg at 04/06/23 0851   metoprolol tartrate (LOPRESSOR) tablet 25 mg, 25 mg, Oral, Q6H, O'Neal, Ronnald Ramp, MD, 25 mg at 04/06/23 1418   ondansetron (ZOFRAN) injection 4 mg, 4 mg, Intravenous, Q6H PRN, O'Neal, Ronnald Ramp, MD   pantoprazole (PROTONIX) EC tablet 40 mg, 40 mg, Oral, Daily, O'Neal, Ronnald Ramp, MD, 40 mg at 04/06/23 0851   predniSONE (DELTASONE) tablet 10 mg, 10 mg, Oral, Q breakfast, O'Neal, Ronnald Ramp, MD, 10 mg at 04/06/23 0851   tamsulosin (FLOMAX) capsule 0.4 mg, 0.4 mg, Oral, BID, O'Neal, Ronnald Ramp, MD, 0.4 mg at 04/06/23 1610  Assessment & Plan .     1.  Paroxysmal atrial fibrillation Click Here to Calculate/Change CHADS2VASc Score The patient's CHADS2-VASc score is 4, indicating a 4.8% annual risk of stroke.  Therefore, anticoagulation is recommended.   CHF History: No HTN History: Yes Diabetes History: No Stroke History: No Vascular Disease History: Yes  2.  Primary hypertension  Recommendations: Patient is presently doing well, tolerating Eliquis without any side effects, continue the same for now.  Hematology was consulted and there is no current indication for starting him on Eliquis.  Will avoid any NSAIDs and aspirin.  Hypertension is well-controlled.  Will discharge him on metoprolol to tartrate 25 mg p.o. twice daily.  I have discussed with the patient and his wife that if blood pressure is soft, they could certainly cut the medication in half if blood pressure drops <90/60 mmHg and to call us.  Will consider initiation of ARB in the outpatient basis in view of vascular disease.  Will set up and scheduled for outpatient follow-up of PAF and aortic aneurysm, if he remains stable can be followed by his PCP with regard to atrial fibrillation as vascular issues are being followed by vascular surgery closely.  I have sent 25  mg twice daily metoprolol tartrate and 5 mg twice daily of Eliquis to transitional care pharmacy.  Yates Decamp, MD, [facility] 04/06/2023, 4:29 PM [facility] [address] [place], [place] [address] Phone: [phone]. Fax:  [phone]  For questions or updates, please contact [facility] Please consult [email or web address] for contact info under        Signed, Yates Decamp, MD, [facility] 04/06/2023, 4:26 PM [facility] [address] [place], [place] [address] Phone: [phone]. Fax:  [phone]  Cell: [phone]

## 2023-04-06 NOTE — TOC Transition Note (Signed)
 Transition of Care [facility]) - Discharge Note   Patient Details  Name: Jason Abbott. MRN: [id number] Date of Birth: 02/22/1945  Transition of Care [facility]) CM/SW Contact:  Leone Haven, RN Phone Number: 04/06/2023, 4:01 PM   Clinical Narrative:    For possible dc today, eliquis is new, copay for refills is 43.00. wife at bedside.   Final next level of care: Home/Self Care Barriers to Discharge: Continued Medical Work up   Patient Goals and CMS Choice Patient states their goals for this hospitalization and ongoing recovery are:: return home   Choice offered to / list presented to : NA      Discharge Placement                       Discharge Plan and Services Additional resources added to the After Visit Summary for   In-house Referral: NA Discharge Planning Services: CM Consult Post Acute Care Choice: NA            DME Agency: NA       HH Arranged: NA          Social Drivers of Health (SDOH) Interventions SDOH Screenings   Food Insecurity: No Food Insecurity (04/03/2023)  Housing: Low Risk  (04/03/2023)  Transportation Needs: No Transportation Needs (04/03/2023)  Utilities: Not At Risk (04/03/2023)  Alcohol Screen: Low Risk  (05/14/2022)  Depression (PHQ2-9): Low Risk  (11/05/2022)  Financial Resource Strain: Low Risk  (11/04/2022)  Physical Activity: Insufficiently Active (11/04/2022)  Social Connections: Unknown (04/03/2023)  Stress: No Stress Concern Present (11/04/2022)  Tobacco Use: Medium Risk (04/06/2023)     Readmission Risk Interventions    04/06/2023    3:57 PM 09/13/2021   11:06 AM  Readmission Risk Prevention Plan  Transportation Screening Complete Complete  PCP or Specialist Appt within 5-7 Days Complete Complete  Home Care Screening Complete Complete  Medication Review (RN CM) Complete Complete

## 2023-04-06 NOTE — Transfer of Care (Signed)
 Immediate Anesthesia Transfer of Care Note  Patient: Jason Abbott.  Procedure(s) Performed: TRANSESOPHAGEAL ECHOCARDIOGRAM CARDIOVERSION  Patient Location: Cath Lab  Anesthesia Type:MAC and General  Level of Consciousness: sedated  Airway & Oxygen Therapy: Patient Spontanous Breathing  Post-op Assessment: Report given to RN and Post -op Vital signs reviewed and stable  Post vital signs: Reviewed and stable  Last Vitals:  Vitals Value Taken Time  BP    Temp    Pulse    Resp    SpO2      Last Pain:  Vitals:   04/06/23 1034  TempSrc:   PainSc: 0-No pain         Complications: No notable events documented.

## 2023-04-06 NOTE — Telephone Encounter (Signed)
 Pharmacy Patient Advocate Encounter  Insurance verification completed.    The patient is insured through [employer].   Ran test claim for ELIQUIES 5MG  TABLETS and the current 30 day co-pay is 43.00.   This test claim was processed through [facility]- copay amounts may vary at other pharmacies due to pharmacy/plan contracts, or as the patient moves through the different stages of their insurance plan.

## 2023-04-06 NOTE — Plan of Care (Signed)
  Problem: Health Behavior/Discharge Planning: Goal: Ability to manage health-related needs will improve Outcome: Adequate for Discharge   Problem: Clinical Measurements: Goal: Will remain free from infection Outcome: Adequate for Discharge Goal: Cardiovascular complication will be avoided 04/06/2023 1735 by Janece Canterbury, RN Outcome: Adequate for Discharge 04/06/2023 1710 by Janece Canterbury, RN Outcome: Progressing   Problem: Activity: Goal: Risk for activity intolerance will decrease 04/06/2023 1735 by Janece Canterbury, RN Outcome: Adequate for Discharge 04/06/2023 1710 by Janece Canterbury, RN Outcome: Progressing   Problem: Cardiac: Goal: Ability to achieve and maintain adequate cardiopulmonary perfusion will improve Outcome: Adequate for Discharge

## 2023-04-06 NOTE — Plan of Care (Signed)
  Problem: Clinical Measurements: Goal: Diagnostic test results will improve Outcome: Progressing Goal: Cardiovascular complication will be avoided Outcome: Progressing   Problem: Activity: Goal: Risk for activity intolerance will decrease Outcome: Progressing   

## 2023-04-07 ENCOUNTER — Telehealth: Payer: Self-pay

## 2023-04-07 ENCOUNTER — Encounter (HOSPITAL_COMMUNITY): Payer: Self-pay | Admitting: Cardiovascular Disease

## 2023-04-07 NOTE — Transitions of Care (Post Inpatient/ED Visit) (Signed)
   04/07/2023  Name: Lissandro Dilorenzo. MRN: [id number] DOB: 06/16/1945  Today's TOC FU Call Status: Today's TOC FU Call Status:: Successful TOC FU Call Completed TOC FU Call Complete Date: 04/07/23 [facility] RN spoke with patient who declined participation in [facility] program. Patient accepted [facility] RN name/number) Patient's Name and Date of Birth confirmed.  Transition Care Management Follow-up Telephone Call Date of Discharge: 04/06/23 Discharge Facility: [facility]) Type of Discharge: Inpatient Admission Primary Inpatient Discharge Diagnosis:: Atrial fibrillation with RVR    Medications Reviewed Today: Patient declined participation in [facility] Program and stated his wife has already reviewed discharge summary and filled his pill box - denied need to review medications    Hilbert Odor RN, CCM [facility]  VBCI-Population Health RN Care Manager [phone]

## 2023-04-16 ENCOUNTER — Encounter: Payer: Self-pay | Admitting: Hematology & Oncology

## 2023-04-16 ENCOUNTER — Other Ambulatory Visit (HOSPITAL_COMMUNITY): Payer: Self-pay

## 2023-04-16 ENCOUNTER — Other Ambulatory Visit: Payer: Self-pay | Admitting: *Deleted

## 2023-04-16 DIAGNOSIS — D591 Autoimmune hemolytic anemia, unspecified: Secondary | ICD-10-CM

## 2023-04-16 DIAGNOSIS — C9110 Chronic lymphocytic leukemia of B-cell type not having achieved remission: Secondary | ICD-10-CM

## 2023-04-16 DIAGNOSIS — D509 Iron deficiency anemia, unspecified: Secondary | ICD-10-CM

## 2023-04-16 DIAGNOSIS — D696 Thrombocytopenia, unspecified: Secondary | ICD-10-CM

## 2023-04-16 MED ORDER — FOLIC ACID 1 MG PO TABS: 2.0000 mg | ORAL_TABLET | Freq: Every day | ORAL | 1 refills | Status: DC

## 2023-04-16 MED ORDER — PREDNISONE 10 MG PO TABS: 10.0000 mg | ORAL_TABLET | Freq: Every day | ORAL | 0 refills | Status: DC

## 2023-04-16 MED ORDER — FLUCONAZOLE 100 MG PO TABS: 100.0000 mg | ORAL_TABLET | Freq: Every day | ORAL | 1 refills | Status: DC

## 2023-04-16 MED ORDER — FAMCICLOVIR 250 MG PO TABS: 250.0000 mg | ORAL_TABLET | Freq: Two times a day (BID) | ORAL | 1 refills | Status: DC

## 2023-04-16 NOTE — Progress Notes (Unsigned)
 Electrophysiology Office Note:   Date:  04/17/2023  ID:  Jason Morning., DOB 04/29/1945, MRN [id number]  Primary Cardiologist: None Electrophysiologist: Nobie Putnam, MD      History of Present Illness:   Jason Bossi. is a 78 y.o. male with a hx of paroxysmal atrial fibrillation S/P thoracic aortic aneurysm repair, chronic RBBB, CLL, hyperlipidemia, previous CVA, BPH, peptic ulcer disease, autoimmune hemolytic anemia who is being seen for follow-up evaluation posthospital discharge.  Discussed the use of AI scribe software for clinical note transcription with the patient, who gave verbal consent to proceed.  History of Present Illness He recently underwent cardioversion during a hospitalization for atrial fibrillation. Since discharge, he has experienced no complaints and his heart rhythm has remained steady, with no episodes of irregular heartbeat. He notes that his pulse feels steady, unlike before the cardioversion when it was 'skipping all over the place.'He recalls experiencing symptoms of atrial fibrillation for a few weeks prior to hospitalization. During his hospital stay, an ultrasound of the heart showed a reduced ejection fraction of about 40%, and a transesophageal echocardiogram revealed a further reduced ejection fraction of 25-30%.He is currently taking metoprolol four times a day, every six hours, but finds this regimen impractical. He feels fatigued, which he attributes to the medication, noting that he often takes doses 'an hour too late, an hour too early.' He is also on Eliquis for stroke prevention.He is planning to travel soon. He has attempted to use a Kardia mobile device for self-monitoring but has encountered technical difficulties in setting it up. No new or acute complaints.   Review of systems complete and found to be negative unless listed in HPI.   EP Information / Studies Reviewed:    EKG is ordered today. Personal review as below.  EKG  Interpretation Date/Time:  Friday April 17 2023 12:38:57 EDT Ventricular Rate:  52 PR Interval:  162 QRS Duration:  144 QT Interval:  474 QTC Calculation: 440 R Axis:   -55  Text Interpretation: Sinus bradycardia with Premature atrial complexes with Abberant conduction Left axis deviation Right bundle branch block When compared with ECG of 06-Apr-2023 12:57, PR interval has decreased Criteria for Inferior infarct are no longer Present Confirmed by Nobie Putnam [phone]) on 04/17/2023 12:45:41 PM   EKG 04/03/23: AF   TEE 04/06/23:  1. Left ventricular ejection fraction, by estimation, is 25 to 30%. The  left ventricle has severely decreased function. The left ventricle  demonstrates global hypokinesis.   2. Right ventricular systolic function is moderately reduced. The right  ventricular size is moderately enlarged.   3. Left atrial size was severely dilated. No left atrial/left atrial  appendage thrombus was detected. The LAA emptying velocity was 20 cm/s.   4. Right atrial size was severely dilated.   5. The mitral valve is grossly normal. Trivial mitral valve  regurgitation. No evidence of mitral stenosis.   6. The aortic valve is tricuspid. Aortic valve regurgitation is trivial.  No aortic stenosis is present.   7. Aortic dilatation noted. There is moderate dilatation of the ascending  aorta, measuring 42 mm.   8. Evidence of atrial level shunting detected by color flow Doppler.  There is a small patent foramen ovale with predominantly left to right  shunting across the atrial septum.   9. 3D performed of the LAA and 3D performed for the EF and demonstrates  No LAA thrombus and confirmed by 3D.   Echo 04/04/23:  1. Left ventricular ejection  fraction, by estimation, is 45 to 50%. The  left ventricle has mildly decreased function. The left ventricle  demonstrates global hypokinesis. Left ventricular diastolic parameters are  indeterminate.   2. Right ventricular systolic function  is low normal. The right  ventricular size is normal.   3. The mitral valve is grossly normal. No evidence of mitral valve  regurgitation. No evidence of mitral stenosis.   4. The aortic valve is tricuspid. Aortic valve regurgitation is not  visualized. Aortic valve sclerosis is present, with no evidence of aortic  valve stenosis.   5. Aortic dilatation noted. Aneurysm of the ascending aorta, measuring 49  mm. There is mild dilatation of the aortic root, measuring 42 mm.   6. The inferior vena cava is dilated in size with >50% respiratory  variability, suggesting right atrial pressure of 8 mmHg.   Risk Assessment/Calculations:    CHA2DS2-VASc Score = 7   This indicates a 11.2% annual risk of stroke. The patient's score is based upon: CHF History: 1 HTN History: 1 Diabetes History: 0 Stroke History: 2 Vascular Disease History: 1 Age Score: 2 Gender Score: 0             Physical Exam:   VS:  BP 110/74   Pulse (!) 52   Ht 5\' 10"  (1.778 m)   Wt 196 lb 6.4 oz (89.1 kg)   SpO2 96%   BMI 28.18 kg/m    Wt Readings from Last 3 Encounters:  04/17/23 196 lb 6.4 oz (89.1 kg)  04/06/23 192 lb 14.4 oz (87.5 kg)  04/03/23 200 lb (90.7 kg)     GEN: Well nourished, well developed in no acute distress NECK: No JVD CARDIAC: Bradycardic, regular rhythm. RESPIRATORY:  Clear to auscultation without rales, wheezing or rhonchi  ABDOMEN: Soft, non-distended EXTREMITIES:  No edema; No deformity   ASSESSMENT AND PLAN:    #.  Paroxysmal atrial fibrillation: First episode occurred following thoracic aortic aneurysm repair.  Recent recurrence requiring hospitalization and cardioversion.  Associated with decline in LVEF. #.  Secondary hypercoagulable state due to atrial fibrillation: CHA2DS2-VASc score of 7. -Due to the associated drop in LVEF, we discussed rhythm control as a long-term strategy, considering both antiarrhythmic drug therapy and catheter ablation.  For now, patient is feeling  well with no recurrence on metoprolol, aside from some fatigue which he attributes to the beta-blocker.  We will continue metoprolol as he has a trip upcoming.  The interim, we will use a Zio to monitor AF burden and have him return at the end of the monitoring session to review antiarrhythmic drug therapy and catheter ablation options pending the results of the monitor. -Transition his metoprolol tartrate which she is taking 4 times daily to metoprolol XL 25 mg in the morning and 50 mg at bedtime.  -Zio monitor to be worn for 2 weeks.  #.  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  LVEF in A-fib was 40 to 45% on TTE and 25% on TEE. -We will repeat echocardiogram around the time of his next visit.  If LVEF remains low in sinus, then we will initiate GDMT and refer to our heart failure colleagues for assistance.  Follow up with Dr. Jimmey Ralph  in 8 weeks.   Signed, Nobie Putnam, MD

## 2023-04-17 ENCOUNTER — Ambulatory Visit: Attending: Cardiology

## 2023-04-17 ENCOUNTER — Ambulatory Visit: Attending: Cardiology | Admitting: Cardiology

## 2023-04-17 ENCOUNTER — Ambulatory Visit: Admitting: Cardiology

## 2023-04-17 ENCOUNTER — Encounter: Payer: Self-pay | Admitting: Cardiology

## 2023-04-17 VITALS — BP 110/74 | HR 52 | Ht 70.0 in | Wt 196.4 lb

## 2023-04-17 DIAGNOSIS — D6869 Other thrombophilia: Secondary | ICD-10-CM | POA: Insufficient documentation

## 2023-04-17 DIAGNOSIS — I4819 Other persistent atrial fibrillation: Secondary | ICD-10-CM | POA: Insufficient documentation

## 2023-04-17 DIAGNOSIS — I5089 Other heart failure: Secondary | ICD-10-CM | POA: Insufficient documentation

## 2023-04-17 DIAGNOSIS — I4891 Unspecified atrial fibrillation: Secondary | ICD-10-CM

## 2023-04-17 MED ORDER — METOPROLOL SUCCINATE ER 25 MG PO TB24: ORAL_TABLET | ORAL | 3 refills | Status: DC

## 2023-04-17 NOTE — Progress Notes (Unsigned)
 Applied a 14 day Zio XT monitor to patient in the office ?

## 2023-04-17 NOTE — Patient Instructions (Addendum)
 Medication Instructions:  Your physician has recommended you make the following change in your medication:  1) STOP taking metoprolol tartrate (Lopressor) 2) START taking metoprolol succinate (Toprol XL) 25 mg in the morning and 50 mg in the evening  *If you need a refill on your cardiac medications before your next appointment, please call your pharmacy*  Testing/Procedures: Echocardiogram  Your physician has requested that you have an echocardiogram. Echocardiography is a painless test that uses sound waves to create images of your heart. It provides your doctor with information about the size and shape of your heart and how well your heart's chambers and valves are working. This procedure takes approximately one hour. There are no restrictions for this procedure. Please do NOT wear cologne, perfume, aftershave, or lotions (deodorant is allowed). Please arrive 15 minutes prior to your appointment time.  Please note: We ask at that you not bring children with you during ultrasound (echo/ vascular) testing. Due to room size and safety concerns, children are not allowed in the ultrasound rooms during exams. Our front office staff cannot provide observation of children in our lobby area while testing is being conducted. An adult accompanying a patient to their appointment will only be allowed in the ultrasound room at the discretion of the ultrasound technician under special circumstances. We apologize for any inconvenience.  Event Monitor Your physician has recommended that you wear an event monitor. Event monitors are medical devices that record the heart's electrical activity. Doctors most often [place] these monitors to diagnose arrhythmias. Arrhythmias are problems with the speed or rhythm of the heartbeat. The monitor is a small, portable device. You can wear one while you do your normal daily activities. This is usually used to diagnose what is causing palpitations/syncope (passing  out).   Follow-Up: At [facility], you and your health needs are our priority.  As part of our continuing mission to provide you with exceptional heart care, we have created designated Provider Care Teams.  These Care Teams include your primary Cardiologist (physician) and Advanced Practice Providers (APPs -  Physician Assistants and Nurse Practitioners) who all work together to provide you with the care you need, when you need it.   Your next appointment:   2 months with Dr. Agustin Cree AT Long term monitor-Live Telemetry  Your physician has requested you wear a ZIO patch monitor for 14 days.  This is a single patch monitor. [employer] supplies one patch monitor per enrollment. Additional  stickers are not available.  Please do not apply patch if you will be having a Nuclear Stress Test, Echocardiogram, Cardiac CT, MRI,  or Chest Xray during the period you would be wearing the monitor. The patch cannot be worn during  these tests. You cannot remove and re-apply the ZIO AT patch monitor.  Your ZIO patch monitor will be mailed 3 day USPS to your address on file. It may take 3-5 days to  receive your monitor after you have been enrolled.  Once you have received your monitor, please review the enclosed instructions. Your monitor has  already been registered assigning a specific monitor serial # to you.   Billing and Patient Assistance Program information  Meredeth Ide has been supplied with any insurance information on record for billing. [employer] offers a sliding scale Patient Assistance Program for patients without insurance, or whose  insurance does not completely cover the cost of the ZIO patch monitor. You must apply for the  Patient Assistance Program to qualify  for the discounted rate. To apply, call [employer] at [phone],  select option 4, select option 2 , ask to apply for the Patient Assistance Program, (you can request an  interpreter if needed). [employer] will ask your  household income and how many people are in your  household. [employer] will quote your out-of-pocket cost based on this information. They will also be able  to set up a 12 month interest free payment plan if needed.  Applying the monitor   Shave hair from upper left chest.  Hold the abrader disc by orange tab. Rub the abrader in 40 strokes over left upper chest as indicated in  your monitor instructions.  Clean area with 4 enclosed alcohol pads. Use all pads to ensure the area is cleaned thoroughly. Let  dry.  Apply patch as indicated in monitor instructions. Patch will be placed under collarbone on left side of  chest with arrow pointing upward.  Rub patch adhesive wings for 2 minutes. Remove the white label marked "1". Remove the white label  marked "2". Rub patch adhesive wings for 2 additional minutes.  While looking in a mirror, press and release button in center of patch. A small green light will flash 3-4  times. This will be your only indicator that the monitor has been turned on.  Do not shower for the first 24 hours. You may shower after the first 24 hours.  Press the button if you feel a symptom. You will hear a small click. Record Date, Time and Symptom in  the Patient Log.   Starting the Gateway  In your kit there is a Audiological scientist box the size of a cellphone. This is Buyer, retail. It transmits all your  recorded data to [facility]. This box must always stay within 10 feet of you. Open the box and push the *  button. There will be a light that blinks orange and then green a few times. When the light stops  blinking, the Gateway is connected to the ZIO patch. Call [employer] at [phone] to confirm your monitor is transmitting.  Returning your monitor  Remove your patch and place it inside the Gateway. In the lower half of the Gateway there is a white  bag with prepaid postage on it. Place Gateway in bag and seal. Mail package back to [place] as soon as  possible. Your  physician should have your final report approximately 7 days after you have mailed back  your monitor. Call [facility] Customer Care at [phone] if you have questions regarding your ZIO AT  patch monitor. Call them immediately if you see an orange light blinking on your monitor.  If your monitor falls off in less than 4 days, contact our Monitor department at [phone]. If your  monitor becomes loose or falls off after 4 days call [employer] at [phone] for suggestions on  securing your monitor

## 2023-04-22 ENCOUNTER — Ambulatory Visit: Admitting: Urology

## 2023-04-23 ENCOUNTER — Encounter: Payer: Self-pay | Admitting: Urology

## 2023-04-23 ENCOUNTER — Other Ambulatory Visit: Payer: Self-pay | Admitting: Urology

## 2023-04-23 DIAGNOSIS — N390 Urinary tract infection, site not specified: Secondary | ICD-10-CM

## 2023-04-23 DIAGNOSIS — N401 Enlarged prostate with lower urinary tract symptoms: Secondary | ICD-10-CM

## 2023-04-23 DIAGNOSIS — R339 Retention of urine, unspecified: Secondary | ICD-10-CM

## 2023-04-23 MED ORDER — FINASTERIDE 5 MG PO TABS: 5.0000 mg | ORAL_TABLET | Freq: Every day | ORAL | 3 refills | Status: DC

## 2023-04-23 MED ORDER — CEPHALEXIN 250 MG PO CAPS: 250.0000 mg | ORAL_CAPSULE | Freq: Every day | ORAL | 0 refills | Status: DC

## 2023-04-29 ENCOUNTER — Ambulatory Visit: Payer: Medicare Other | Admitting: Urology

## 2023-05-04 ENCOUNTER — Inpatient Hospital Stay: Attending: Hematology & Oncology

## 2023-05-04 ENCOUNTER — Inpatient Hospital Stay (HOSPITAL_BASED_OUTPATIENT_CLINIC_OR_DEPARTMENT_OTHER): Admitting: Hematology & Oncology

## 2023-05-04 ENCOUNTER — Encounter: Payer: Self-pay | Admitting: Hematology & Oncology

## 2023-05-04 DIAGNOSIS — D509 Iron deficiency anemia, unspecified: Secondary | ICD-10-CM | POA: Diagnosis not present

## 2023-05-04 DIAGNOSIS — Z7952 Long term (current) use of systemic steroids: Secondary | ICD-10-CM | POA: Insufficient documentation

## 2023-05-04 DIAGNOSIS — I4891 Unspecified atrial fibrillation: Secondary | ICD-10-CM

## 2023-05-04 DIAGNOSIS — D591 Autoimmune hemolytic anemia, unspecified: Secondary | ICD-10-CM

## 2023-05-04 DIAGNOSIS — Z7901 Long term (current) use of anticoagulants: Secondary | ICD-10-CM | POA: Insufficient documentation

## 2023-05-04 DIAGNOSIS — D696 Thrombocytopenia, unspecified: Secondary | ICD-10-CM

## 2023-05-04 DIAGNOSIS — C9110 Chronic lymphocytic leukemia of B-cell type not having achieved remission: Secondary | ICD-10-CM | POA: Diagnosis not present

## 2023-05-04 DIAGNOSIS — I480 Paroxysmal atrial fibrillation: Secondary | ICD-10-CM | POA: Insufficient documentation

## 2023-05-04 LAB — CBC WITH DIFFERENTIAL (CANCER CENTER ONLY)
Abs Immature Granulocytes: 0.06 10*3/uL (ref 0.00–0.07)
Basophils Absolute: 0.1 10*3/uL (ref 0.0–0.1)
Basophils Relative: 1 %
Eosinophils Absolute: 0.1 10*3/uL (ref 0.0–0.5)
Eosinophils Relative: 2 %
HCT: 44.8 % (ref 39.0–52.0)
Hemoglobin: 15.1 g/dL (ref 13.0–17.0)
Immature Granulocytes: 1 %
Lymphocytes Relative: 44 %
Lymphs Abs: 3.5 10*3/uL (ref 0.7–4.0)
MCH: 33 pg (ref 26.0–34.0)
MCHC: 33.7 g/dL (ref 30.0–36.0)
MCV: 97.8 fL (ref 80.0–100.0)
Monocytes Absolute: 0.8 10*3/uL (ref 0.1–1.0)
Monocytes Relative: 11 %
Neutro Abs: 3.1 10*3/uL (ref 1.7–7.7)
Neutrophils Relative %: 41 %
Platelet Count: 85 10*3/uL — ABNORMAL LOW (ref 150–400)
RBC: 4.58 MIL/uL (ref 4.22–5.81)
RDW: 15.2 % (ref 11.5–15.5)
Smear Review: NORMAL
WBC Count: 7.7 10*3/uL (ref 4.0–10.5)
nRBC: 0 % (ref 0.0–0.2)

## 2023-05-04 LAB — CMP (CANCER CENTER ONLY)
ALT: 18 U/L (ref 0–44)
AST: 23 U/L (ref 15–41)
Albumin: 4.4 g/dL (ref 3.5–5.0)
Alkaline Phosphatase: 59 U/L (ref 38–126)
Anion gap: 11 (ref 5–15)
BUN: 19 mg/dL (ref 8–23)
CO2: 28 mmol/L (ref 22–32)
Calcium: 9.6 mg/dL (ref 8.9–10.3)
Chloride: 105 mmol/L (ref 98–111)
Creatinine: 1.22 mg/dL (ref 0.61–1.24)
GFR, Estimated: >60 mL/min (ref >60)
Glucose, Bld: 100 mg/dL — ABNORMAL HIGH (ref 70–99)
Potassium: 5.2 mmol/L — ABNORMAL HIGH (ref 3.5–5.1)
Sodium: 144 mmol/L (ref 135–145)
Total Bilirubin: 0.6 mg/dL (ref 0.0–1.2)
Total Protein: 6.4 g/dL — ABNORMAL LOW (ref 6.5–8.1)

## 2023-05-04 LAB — RETICULOCYTES
Immature Retic Fract: 16.9 % — ABNORMAL HIGH (ref 2.3–15.9)
RBC.: 4.63 MIL/uL (ref 4.22–5.81)
Retic Count, Absolute: 104.6 10*3/uL (ref 19.0–186.0)
Retic Ct Pct: 2.3 % (ref 0.4–3.1)

## 2023-05-04 LAB — LACTATE DEHYDROGENASE: LDH: 183 U/L (ref 98–192)

## 2023-05-04 MED ORDER — FOLIC ACID 1 MG PO TABS: 2.0000 mg | ORAL_TABLET | Freq: Every day | ORAL | 1 refills | Status: DC

## 2023-05-04 MED ORDER — PREDNISONE 10 MG PO TABS: 10.0000 mg | ORAL_TABLET | Freq: Every day | ORAL | 1 refills | Status: DC

## 2023-05-04 NOTE — Progress Notes (Signed)
 Hematology and Oncology Follow Up Visit  Jason Abbott [id number] 01/23/1945 78 y.o. 05/04/2023   Principle Diagnosis:  Stage C CLL -autoimmune hemolytic anemia Atrial fibrillation -paroxysmal  Current Therapy:   Prednisone  80 mg p.o. daily-started on 08/20/2022 --decrease to 40 mg p.o. daily on 09/04/2022 Prednisone  10 mg p.o. daily Rituxan /Bendamustine -start treatment on 11/20/2022 -status post cycle 1-DC on 12/18/2022 due to poor tolerance Eliquis  5 mg p.o. twice daily-started on 04/06/2023   Interim History:  Mr. Swee is here today for follow-up.  He is doing better.  Thankfully, over last saw him, he found that he had atrial fibrillation.  He was admitted.  He has been placed on medications.  He is now back in sinus rhythm from what what I can tell.  He is on Eliquis .  He and his wife are getting ready to go on the trip out to [place].  I think they would be leaving on Friday.  They probably will be gone for about a month.  From a hematologic point of view, I really do not see a problem with them going on this trip.  He feels well.  There is no cough.  He has had no chest pain.  He has had no nausea or vomiting.  There has been no fever.  He has had no rashes.  He has had no change in bowel or bladder habits.  Overall, I would say his performance status is ECOG 1.    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        Medication List        Accurate as of May 04, 2023 12:40 PM. If you have any questions, ask your nurse or doctor.          apixaban  5 MG Tabs tablet Commonly known as: ELIQUIS  Take 1 tablet (5 mg total) by mouth 2 (two) times daily.   AZO CRANBERRY PO Take 1 capsule by mouth daily with  supper.   cephALEXin  250 MG capsule Commonly known as: KEFLEX  Take 1 capsule (250 mg total) by mouth daily.   cyanocobalamin  1000 MCG tablet Commonly known as: VITAMIN B12 Take 1,000 mcg by mouth daily.   famciclovir  250 MG tablet Commonly known as: FAMVIR  Take 1 tablet (250 mg total) by mouth 2 (two) times daily.   ferrous sulfate 325 (65 FE) MG tablet Take 325 mg by mouth daily.   finasteride  5 MG tablet Commonly known as: PROSCAR  Take 1 tablet (5 mg total) by mouth daily.   fluconazole  100 MG tablet Commonly known as: DIFLUCAN  Take 1 tablet (100 mg total) by mouth daily.   folic acid  1 MG tablet Commonly known as: FOLVITE  Take 2 tablets (2 mg total) by mouth daily.   Mens Multivitamin Gummies Chew Chew 2 each by mouth daily.   metoprolol  succinate 25 MG 24 hr tablet Commonly known as: Toprol  XL Take 1 tablet (25 mg total) by mouth in the morning AND 2 tablets (50 mg total) at bedtime.   predniSONE  10 MG tablet Commonly known as: DELTASONE  Take 1 tablet (10 mg total) by mouth daily with breakfast.   tamsulosin  0.4 MG Caps capsule Commonly known  as: FLOMAX  Take 1 capsule (0.4 mg total) by mouth in the morning and at bedtime.   Unisom  Sleepgels 50 MG Caps Generic drug: diphenhydrAMINE  HCl (Sleep) Take 50 mg by mouth at bedtime.   Vitamin D3 125 MCG (5000 UT) Caps Take 5,000 Units by mouth daily with supper.        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     Past Medical History, Surgical history, Social history, and Family History were reviewed and updated.  Review of Systems: Review of Systems  Constitutional:  Positive for malaise/fatigue.  HENT: Negative.    Eyes: Negative.   Respiratory: Negative.    Cardiovascular: Negative.   Gastrointestinal: Negative.   Genitourinary: Negative.   Musculoskeletal: Negative.   Skin: Negative.    Neurological: Negative.   Endo/Heme/Allergies: Negative.   Psychiatric/Behavioral: Negative.       Physical Exam: Vital signs show temperature of 97.3.  Pulse 53.  Blood pressure 134/79.  Weight is 200 pounds.   Wt Readings from Last 3 Encounters:  05/04/23 200 lb (90.7 kg)  04/17/23 196 lb 6.4 oz (89.1 kg)  04/06/23 192 lb 14.4 oz (87.5 kg)    Physical Exam Vitals reviewed.  HENT:     Head: Normocephalic and atraumatic.  Eyes:     Pupils: Pupils are equal, round, and reactive to light.  Cardiovascular:     Rate and Rhythm: Normal rate and regular rhythm.     Heart sounds: Normal heart sounds.  Pulmonary:     Effort: Pulmonary effort is normal.     Breath sounds: Normal breath sounds.  Abdominal:     General: Bowel sounds are normal.     Palpations: Abdomen is soft.  Musculoskeletal:        General: No tenderness or deformity. Normal range of motion.     Cervical back: Normal range of motion.  Lymphadenopathy:     Cervical: No cervical adenopathy.  Skin:    General: Skin is warm and dry.     Findings: No erythema or rash.  Neurological:     Mental Status: He is alert and oriented to person, place, and time.  Psychiatric:        Behavior: Behavior normal.        Thought Content: Thought content normal.        Judgment: Judgment normal.     Lab Results  Component Value Date   WBC 8.1 04/06/2023   HGB 14.1 04/06/2023   HCT 41.9 04/06/2023   MCV 95.4 04/06/2023   PLT 90 (L) 04/06/2023   Lab Results  Component Value Date   FERRITIN 18 (L) 02/17/2023   IRON 74 02/17/2023   TIBC 419 02/17/2023   UIBC 345 02/17/2023   IRONPCTSAT 18 02/17/2023   Lab Results  Component Value Date   RETICCTPCT 2.7 04/03/2023   RBC 4.39 04/06/2023   Lab Results  Component Value Date   KPAFRELGTCHN 12.8 08/26/2021   LAMBDASER 24.9 08/26/2021   KAPLAMBRATIO 0.51 08/26/2021   Lab Results  Component Value Date   IGGSERUM 808 08/26/2021   IGA 149 08/26/2021   IGMSERUM 39  08/26/2021   No results found for: "TOTALPROTELP", "ALBUMINELP", "A1GS", "A2GS", "BETS", "BETA2SER", "GAMS", "MSPIKE", "SPEI"   Chemistry      Component Value Date/Time   NA 140 04/04/2023 0738   NA 142 11/05/2022 1127   K 3.8 04/04/2023 0738  CL 105 04/04/2023 0738   CO2 25 04/04/2023 0738   BUN 16 04/04/2023 0738   BUN 18 11/05/2022 1127   CREATININE 1.08 04/04/2023 0738   CREATININE 1.21 04/03/2023 1332   CREATININE 1.03 08/15/2021 0000      Component Value Date/Time   CALCIUM  8.8 (L) 04/04/2023 0738   ALKPHOS 58 04/03/2023 1332   AST 16 04/03/2023 1332   ALT 15 04/03/2023 1332   BILITOT 0.6 04/03/2023 1332      Impression and Plan: Mr. Adjei is a very pleasant 78 yo caucasian gentleman with stage C CLL. He developed autoimmune hemolytic anemia.  We started him on prednisone  which he  tolerated well.   We then gave him 1 cycle of Rituxan /Bendamustine .  He tolerated this incredibly poorly.  Since then, we have just watched him.  I do not see a problem with the CLL.  His white cell count is doing okay.  He does not have anemia.  His reticulocyte count is okay.  His platelet count may be down a little bit but again I do not think this is a problem.  Again, I do not see a problem with him going out to [place].  I know they will stop along the way.  They will stay well-hydrated.  We will plan to get him back to see us  after [facility] day.  If all looks good at that point, then maybe we can get him through most of Summer.    Ivor Mars, MD 4/28/202512:40 PM

## 2023-05-07 ENCOUNTER — Ambulatory Visit: Admitting: Hematology & Oncology

## 2023-05-07 ENCOUNTER — Other Ambulatory Visit

## 2023-05-13 DIAGNOSIS — I4891 Unspecified atrial fibrillation: Secondary | ICD-10-CM | POA: Diagnosis not present

## 2023-05-17 DIAGNOSIS — I4891 Unspecified atrial fibrillation: Secondary | ICD-10-CM

## 2023-05-23 DIAGNOSIS — R350 Frequency of micturition: Secondary | ICD-10-CM | POA: Diagnosis not present

## 2023-05-24 ENCOUNTER — Ambulatory Visit: Payer: Self-pay | Admitting: Cardiology

## 2023-06-03 ENCOUNTER — Encounter: Payer: Self-pay | Admitting: Hematology & Oncology

## 2023-06-04 DIAGNOSIS — C9110 Chronic lymphocytic leukemia of B-cell type not having achieved remission: Secondary | ICD-10-CM | POA: Diagnosis not present

## 2023-06-04 LAB — LAB REPORT - SCANNED: EGFR: 65

## 2023-06-08 ENCOUNTER — Ambulatory Visit (HOSPITAL_COMMUNITY)

## 2023-06-09 ENCOUNTER — Other Ambulatory Visit: Payer: Self-pay

## 2023-06-09 ENCOUNTER — Encounter: Payer: Self-pay | Admitting: Hematology & Oncology

## 2023-06-09 ENCOUNTER — Other Ambulatory Visit: Payer: Self-pay | Admitting: Urology

## 2023-06-09 ENCOUNTER — Other Ambulatory Visit: Payer: Self-pay | Admitting: *Deleted

## 2023-06-09 ENCOUNTER — Encounter: Payer: Self-pay | Admitting: Cardiology

## 2023-06-09 DIAGNOSIS — R339 Retention of urine, unspecified: Secondary | ICD-10-CM

## 2023-06-09 DIAGNOSIS — N401 Enlarged prostate with lower urinary tract symptoms: Secondary | ICD-10-CM

## 2023-06-09 DIAGNOSIS — D591 Autoimmune hemolytic anemia, unspecified: Secondary | ICD-10-CM

## 2023-06-09 DIAGNOSIS — D696 Thrombocytopenia, unspecified: Secondary | ICD-10-CM

## 2023-06-09 DIAGNOSIS — C9110 Chronic lymphocytic leukemia of B-cell type not having achieved remission: Secondary | ICD-10-CM

## 2023-06-09 DIAGNOSIS — D509 Iron deficiency anemia, unspecified: Secondary | ICD-10-CM

## 2023-06-09 MED ORDER — METOPROLOL SUCCINATE ER 25 MG PO TB24: ORAL_TABLET | ORAL | 3 refills | Status: DC

## 2023-06-09 MED ORDER — TAMSULOSIN HCL 0.4 MG PO CAPS: 0.4000 mg | ORAL_CAPSULE | Freq: Two times a day (BID) | ORAL | 2 refills | Status: DC

## 2023-06-09 MED ORDER — FLUCONAZOLE 100 MG PO TABS: 100.0000 mg | ORAL_TABLET | Freq: Every day | ORAL | 1 refills | Status: DC

## 2023-06-09 MED ORDER — FAMCICLOVIR 250 MG PO TABS: 250.0000 mg | ORAL_TABLET | Freq: Two times a day (BID) | ORAL | 1 refills | Status: DC

## 2023-06-09 MED ORDER — FINASTERIDE 5 MG PO TABS: 5.0000 mg | ORAL_TABLET | Freq: Every day | ORAL | 2 refills | Status: DC

## 2023-06-09 MED ORDER — PREDNISONE 10 MG PO TABS: 10.0000 mg | ORAL_TABLET | Freq: Every day | ORAL | Status: DC

## 2023-06-09 MED ORDER — FOLIC ACID 1 MG PO TABS
2.0000 mg | ORAL_TABLET | Freq: Every day | ORAL | 1 refills | Status: AC
Start: 2023-06-09 — End: ?

## 2023-06-09 MED ORDER — APIXABAN 5 MG PO TABS: 5.0000 mg | ORAL_TABLET | Freq: Two times a day (BID) | ORAL | 5 refills | Status: DC

## 2023-06-09 NOTE — Telephone Encounter (Signed)
 Prescription refill request for Eliquis  received. Indication:afib Last office visit:4/25 Scr:1.15  5/25 Age: 78 Weight:90.7  kg  Prescription refilled

## 2023-06-11 ENCOUNTER — Encounter (HOSPITAL_COMMUNITY): Payer: Self-pay | Admitting: Cardiology

## 2023-06-12 ENCOUNTER — Inpatient Hospital Stay

## 2023-06-12 ENCOUNTER — Ambulatory Visit: Admitting: Hematology & Oncology

## 2023-06-14 ENCOUNTER — Encounter: Payer: Self-pay | Admitting: Hematology & Oncology

## 2023-06-16 ENCOUNTER — Inpatient Hospital Stay: Admitting: Hematology & Oncology

## 2023-06-16 ENCOUNTER — Inpatient Hospital Stay

## 2023-06-18 ENCOUNTER — Ambulatory Visit: Admitting: Cardiology

## 2023-06-29 ENCOUNTER — Ambulatory Visit (HOSPITAL_COMMUNITY)
Admission: RE | Admit: 2023-06-29 | Discharge: 2023-06-29 | Disposition: A | Discharge status: Home / Self Care | Source: Ambulatory Visit | Attending: Cardiology | Admitting: Cardiology

## 2023-06-29 DIAGNOSIS — I4819 Other persistent atrial fibrillation: Secondary | ICD-10-CM | POA: Diagnosis not present

## 2023-06-29 LAB — ECHOCARDIOGRAM COMPLETE
Area-P 1/2: 2.26 cm2
S' Lateral: 3.9 cm

## 2023-06-30 ENCOUNTER — Encounter: Payer: Self-pay | Admitting: Medical Oncology

## 2023-06-30 ENCOUNTER — Inpatient Hospital Stay: Attending: Hematology & Oncology

## 2023-06-30 ENCOUNTER — Inpatient Hospital Stay (HOSPITAL_BASED_OUTPATIENT_CLINIC_OR_DEPARTMENT_OTHER): Admitting: Medical Oncology

## 2023-06-30 VITALS — BP 139/60 | HR 50 | Temp 97.8°F | Resp 19 | Ht 70.0 in | Wt 196.0 lb

## 2023-06-30 DIAGNOSIS — Z7952 Long term (current) use of systemic steroids: Secondary | ICD-10-CM | POA: Insufficient documentation

## 2023-06-30 DIAGNOSIS — D696 Thrombocytopenia, unspecified: Secondary | ICD-10-CM | POA: Diagnosis not present

## 2023-06-30 DIAGNOSIS — Z7901 Long term (current) use of anticoagulants: Secondary | ICD-10-CM | POA: Diagnosis not present

## 2023-06-30 DIAGNOSIS — I480 Paroxysmal atrial fibrillation: Secondary | ICD-10-CM | POA: Diagnosis not present

## 2023-06-30 DIAGNOSIS — C9110 Chronic lymphocytic leukemia of B-cell type not having achieved remission: Secondary | ICD-10-CM

## 2023-06-30 DIAGNOSIS — D591 Autoimmune hemolytic anemia, unspecified: Secondary | ICD-10-CM | POA: Diagnosis not present

## 2023-06-30 DIAGNOSIS — Z79899 Other long term (current) drug therapy: Secondary | ICD-10-CM | POA: Diagnosis not present

## 2023-06-30 DIAGNOSIS — D509 Iron deficiency anemia, unspecified: Secondary | ICD-10-CM | POA: Diagnosis not present

## 2023-06-30 LAB — CBC WITH DIFFERENTIAL (CANCER CENTER ONLY)
Abs Immature Granulocytes: 0.03 10*3/uL (ref 0.00–0.07)
Basophils Absolute: 0 10*3/uL (ref 0.0–0.1)
Basophils Relative: 0 %
Eosinophils Absolute: 0.1 10*3/uL (ref 0.0–0.5)
Eosinophils Relative: 1 %
HCT: 45.6 % (ref 39.0–52.0)
Hemoglobin: 15.2 g/dL (ref 13.0–17.0)
Immature Granulocytes: 0 %
Lymphocytes Relative: 38 %
Lymphs Abs: 3.7 10*3/uL (ref 0.7–4.0)
MCH: 33.3 pg (ref 26.0–34.0)
MCHC: 33.3 g/dL (ref 30.0–36.0)
MCV: 99.8 fL (ref 80.0–100.0)
Monocytes Absolute: 0.8 10*3/uL (ref 0.1–1.0)
Monocytes Relative: 8 %
Neutro Abs: 5.2 10*3/uL (ref 1.7–7.7)
Neutrophils Relative %: 53 %
Platelet Count: 95 10*3/uL — ABNORMAL LOW (ref 150–400)
RBC: 4.57 MIL/uL (ref 4.22–5.81)
RDW: 14.6 % (ref 11.5–15.5)
Smear Review: NORMAL
WBC Count: 9.8 10*3/uL (ref 4.0–10.5)
nRBC: 0 % (ref 0.0–0.2)

## 2023-06-30 LAB — CMP (CANCER CENTER ONLY)
ALT: 10 U/L (ref 0–44)
AST: 14 U/L — ABNORMAL LOW (ref 15–41)
Albumin: 4.3 g/dL (ref 3.5–5.0)
Alkaline Phosphatase: 44 U/L (ref 38–126)
Anion gap: 6 (ref 5–15)
BUN: 20 mg/dL (ref 8–23)
CO2: 34 mmol/L — ABNORMAL HIGH (ref 22–32)
Calcium: 9.7 mg/dL (ref 8.9–10.3)
Chloride: 105 mmol/L (ref 98–111)
Creatinine: 1.2 mg/dL (ref 0.61–1.24)
GFR, Estimated: >60 mL/min (ref >60)
Glucose, Bld: 108 mg/dL — ABNORMAL HIGH (ref 70–99)
Potassium: 4.7 mmol/L (ref 3.5–5.1)
Sodium: 145 mmol/L (ref 135–145)
Total Bilirubin: 0.7 mg/dL (ref 0.0–1.2)
Total Protein: 6 g/dL — ABNORMAL LOW (ref 6.5–8.1)

## 2023-06-30 LAB — SAVE SMEAR(SSMR), FOR PROVIDER SLIDE REVIEW

## 2023-06-30 NOTE — Progress Notes (Signed)
 Hematology and Oncology Follow Up Visit  Eura Radabaugh [id number] [birth year] 78 y.o. 06/30/2023   Principle Diagnosis:  Stage C CLL -autoimmune hemolytic anemia Atrial fibrillation -paroxysmal  Current Therapy:   Prednisone  80 mg p.o. daily-started on 08/20/2022 --decrease to 40 mg p.o. daily on 09/04/2022 Prednisone  10 mg p.o. daily Rituxan /Bendamustine -start treatment on 11/20/2022 -status post cycle 1-DC on 12/18/2022 due to poor tolerance Eliquis  5 mg p.o. twice daily-started on 04/06/2023   Interim History:  Mr. Zuver is here today for follow-up.   Today he states that he has been doing ok.   He is now on Eliquis  for A. Fib.   There has been no bleeding to his knowledge: denies epistaxis, gingivitis, hemoptysis, hematemesis, hematuria, melena, excessive bruising, blood donation.   He feels well.  There is no cough.  He has had no chest pain.  He has had no nausea or vomiting.  There has been no fever.  He has had no rashes.  He has had no change in bowel or bladder habits.  Overall, I would say his performance status is ECOG 1.    Wt Readings from Last 3 Encounters:  06/30/23 196 lb (88.9 kg)  05/04/23 200 lb (90.7 kg)  04/17/23 196 lb 6.4 oz (89.1 kg)     Medications:  Allergies as of 06/30/2023       Reactions   Levaquin  [levofloxacin ] Other (See Comments)   Hx of aortic aneurysm, has since been repaired.        Medication List        Accurate as of June 30, 2023  1:05 PM. If you have any questions, ask your nurse or doctor.          apixaban  5 MG Tabs tablet Commonly known as: ELIQUIS  Take 1 tablet (5 mg total) by mouth 2 (two) times daily.   AZO CRANBERRY PO Take 1 capsule by mouth daily with supper.   cephALEXin  250 MG capsule Commonly known as: KEFLEX  Take 1 capsule (250 mg total) by mouth daily.   cyanocobalamin  1000 MCG tablet Commonly known as: VITAMIN B12 Take 1,000 mcg by mouth daily.   famciclovir  250 MG tablet Commonly known  as: FAMVIR  Take 1 tablet (250 mg total) by mouth 2 (two) times daily.   ferrous sulfate 325 (65 FE) MG tablet Take 325 mg by mouth daily.   finasteride  5 MG tablet Commonly known as: PROSCAR  Take 1 tablet (5 mg total) by mouth daily.   fluconazole  100 MG tablet Commonly known as: DIFLUCAN  Take 1 tablet (100 mg total) by mouth daily.   folic acid  1 MG tablet Commonly known as: FOLVITE  Take 2 tablets (2 mg total) by mouth daily.   Mens Multivitamin Gummies Chew Chew 2 each by mouth daily.   metoprolol  succinate 25 MG 24 hr tablet Commonly known as: Toprol  XL Take 1 tablet (25 mg total) by mouth in the morning AND 2 tablets (50 mg total) at bedtime.   predniSONE  10 MG tablet Commonly known as: DELTASONE  Take 1 tablet (10 mg total) by mouth daily with breakfast.   tamsulosin  0.4 MG Caps capsule Commonly known as: FLOMAX  Take 1 capsule (0.4 mg total) by mouth in the morning and at bedtime.   Unisom  Sleepgels 50 MG Caps Generic drug: diphenhydrAMINE  HCl (Sleep) Take 50 mg by mouth at bedtime.   Vitamin D3 125 MCG (5000 UT) Caps Take 5,000 Units by mouth daily with supper.        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     Past Medical History, Surgical history, Social history, and Family History were reviewed and updated.  Review of Systems: Review of Systems  Constitutional:  Positive for malaise/fatigue.  HENT: Negative.    Eyes: Negative.   Respiratory: Negative.    Cardiovascular: Negative.   Gastrointestinal: Negative.   Genitourinary: Negative.   Musculoskeletal: Negative.   Skin: Negative.   Neurological: Negative.   Endo/Heme/Allergies: Negative.   Psychiatric/Behavioral: Negative.       Physical Exam: Vitals:   06/30/23 1241  BP: 139/60  Pulse: (!) 50  Resp: 19  Temp: 97.8 F (36.6 C)  SpO2: 98%    Wt Readings from Last 3 Encounters:   06/30/23 196 lb (88.9 kg)  05/04/23 200 lb (90.7 kg)  04/17/23 196 lb 6.4 oz (89.1 kg)    Physical Exam Vitals reviewed.  HENT:     Head: Normocephalic and atraumatic.   Eyes:     Pupils: Pupils are equal, round, and reactive to light.    Cardiovascular:     Rate and Rhythm: Normal rate and regular rhythm.     Heart sounds: Normal heart sounds.  Pulmonary:     Effort: Pulmonary effort is normal.     Breath sounds: Normal breath sounds.  Abdominal:     General: Bowel sounds are normal.     Palpations: Abdomen is soft.   Musculoskeletal:        General: No tenderness or deformity. Normal range of motion.     Cervical back: Normal range of motion.  Lymphadenopathy:     Cervical: No cervical adenopathy.   Skin:    General: Skin is warm and dry.     Findings: No erythema or rash.   Neurological:     Mental Status: He is alert and oriented to person, place, and time.   Psychiatric:        Behavior: Behavior normal.        Thought Content: Thought content normal.        Judgment: Judgment normal.      Lab Results  Component Value Date   WBC 9.8 06/30/2023   HGB 15.2 06/30/2023   HCT 45.6 06/30/2023   MCV 99.8 06/30/2023   PLT 95 (L) 06/30/2023   Lab Results  Component Value Date   FERRITIN 18 (L) 02/17/2023   IRON 74 02/17/2023   TIBC 419 02/17/2023   UIBC 345 02/17/2023   IRONPCTSAT 18 02/17/2023   Lab Results  Component Value Date   RETICCTPCT 2.3 05/04/2023   RBC 4.57 06/30/2023   Lab Results  Component Value Date   KPAFRELGTCHN 12.8 08/26/2021   LAMBDASER 24.9 08/26/2021   KAPLAMBRATIO 0.51 08/26/2021   Lab Results  Component Value Date   IGGSERUM 808 08/26/2021   IGA 149 08/26/2021   IGMSERUM 39 08/26/2021   No results found for: STEPHANY CARLOTA BENSON MARKEL EARLA JOANNIE DOC VICK, SPEI   Chemistry      Component Value Date/Time   NA 144 05/04/2023 1207   NA 142 11/05/2022 1127   K 5.2 (H) 05/04/2023  1207   CL 105 05/04/2023 1207   CO2 28 05/04/2023 1207   BUN 19 05/04/2023 1207   BUN 18 11/05/2022 1127   CREATININE 1.22 05/04/2023 1207   CREATININE 1.03 08/15/2021 0000      Component Value Date/Time   CALCIUM  9.6 05/04/2023 1207  ALKPHOS 59 05/04/2023 1207   AST 23 05/04/2023 1207   ALT 18 05/04/2023 1207   BILITOT 0.6 05/04/2023 1207     Encounter Diagnoses  Name Primary?   CLL (chronic lymphocytic leukemia) (HCC) Yes   Thrombocytopenia (HCC)    Autoimmune hemolytic anemia (HCC)    Iron deficiency anemia, unspecified iron deficiency anemia type     Impression and Plan: Mr. Lantigua is a very pleasant 78 yo caucasian gentleman with stage C CLL. He developed autoimmune hemolytic anemia.  We started him on prednisone  which he  tolerated well. We then gave him 1 cycle of Rituxan /Bendamustine .  He tolerated this incredibly poorly.  Since then, we have just watched him.  CBC today looks stable. His white cell count is doing okay.  He does not have anemia.  His reticulocyte count is okay.  Platelets are improved at 95.   RTC 2 months MD, labs (CBC w/, CMP, LDH, save smear, iron, ferritin)  Lauraine CHRISTELLA Dais, PA-C 6/24/20251:05 PM

## 2023-07-02 ENCOUNTER — Ambulatory Visit: Admitting: Urology

## 2023-07-02 ENCOUNTER — Ambulatory Visit: Attending: Cardiology | Admitting: Cardiology

## 2023-07-02 ENCOUNTER — Other Ambulatory Visit: Payer: Self-pay

## 2023-07-02 ENCOUNTER — Encounter: Payer: Self-pay | Admitting: Cardiology

## 2023-07-02 VITALS — BP 120/82 | HR 53 | Ht 70.0 in | Wt 194.0 lb

## 2023-07-02 DIAGNOSIS — I4819 Other persistent atrial fibrillation: Secondary | ICD-10-CM | POA: Diagnosis not present

## 2023-07-02 DIAGNOSIS — I4891 Unspecified atrial fibrillation: Secondary | ICD-10-CM

## 2023-07-02 DIAGNOSIS — I471 Supraventricular tachycardia, unspecified: Secondary | ICD-10-CM | POA: Insufficient documentation

## 2023-07-02 DIAGNOSIS — D6869 Other thrombophilia: Secondary | ICD-10-CM | POA: Diagnosis present

## 2023-07-02 DIAGNOSIS — I5032 Chronic diastolic (congestive) heart failure: Secondary | ICD-10-CM | POA: Insufficient documentation

## 2023-07-02 NOTE — Progress Notes (Deleted)
 Assessment: 1. Recurrent UTI   2. Benign prostatic hyperplasia with incomplete bladder emptying   3. Incomplete bladder emptying     Plan: ***  Chief Complaint: No chief complaint on file.   HPI: Jason Braniff. is a 78 y.o. male who presents for continued evaluation of BPH with lower urinary tract symptoms, history of recurrent UTIs. He was previously evaluated by Dr. Shona and was last seen in December 2024. He underwent cystoscopy in August 2024 which was unremarkable except for findings consistent with BPH.  Renal ultrasound from 2022 showed no abnormalities.  He has had PVRs in the 200-300 mL range.  He was started on combination medical therapy with tamsulosin  and finasteride  as well as low-dose Keflex  suppression.  He was doing very well at his visit in December 2024.   PVR = 202 mL. IPSS = 5.   Portions of the above documentation were copied from a prior visit for review purposes only.  Allergies: Allergies  Allergen Reactions   Levaquin  [Levofloxacin ] Other (See Comments)    Hx of aortic aneurysm, has since been repaired.     PMH: Past Medical History:  Diagnosis Date   Aberrant right subclavian artery    Aortic arch aneurysm (HCC)    Arthritis    Atrial fibrillation with rapid ventricular response (HCC) 04/03/2023   Bowel obstruction (HCC)    CLL (chronic lymphocytic leukemia) (HCC)    COVID 2021   hospitalized for it   Diverticul disease small and large intestine, no perforati or abscess    GERD (gastroesophageal reflux disease)    uses tums as needed   H/O urinary infection    Pneumonia 09/22/2021   Shingles outbreak 10/03/2013   Stroke (HCC) 01/2019    PSH: Past Surgical History:  Procedure Laterality Date   AORTIC ARCH DEBRANCHING N/A 01/10/2022   Procedure: AORTIC ARCH DEBRANCHING WITH 16 X 8 X 10 MM HEMASHIELD GOLD GRAFT;  Surgeon: Lucas Dorise POUR, MD;  Location: [facility] OR;  Service: Open Heart Surgery;  Laterality: N/A;   CARDIOVERSION N/A  04/06/2023   Procedure: CARDIOVERSION;  Surgeon: Barbaraann Darryle Ned, MD;  Location: [facility] INVASIVE CV LAB;  Service: Cardiovascular;  Laterality: N/A;   CAROTID-SUBCLAVIAN BYPASS GRAFT Right 12/18/2021   Procedure: RIGHT BYPASS GRAFT CAROTID-SUBCLAVIAN;  Surgeon: Serene Gaile LELON, MD;  Location: [facility] OR;  Service: Vascular;  Laterality: Right;   CARPAL TUNNEL RELEASE Right 11/25/2021   Procedure: CARPAL TUNNEL RELEASE;  Surgeon: Romona Harari, MD;  Location: [facility] OR;  Service: Orthopedics;  Laterality: Right;  or MAC with regional block 60   HERNIA REPAIR     MECKEL DIVERTICULUM EXCISION     ORIF FINGER / THUMB FRACTURE     TEE WITHOUT CARDIOVERSION N/A 01/10/2022   Procedure: TRANSESOPHAGEAL ECHOCARDIOGRAM (TEE);  Surgeon: Lucas Dorise POUR, MD;  Location: [facility] OR;  Service: Open Heart Surgery;  Laterality: N/A;   TENOSYNOVECTOMY Right 11/25/2021   Procedure: FLEXOR TENOSYNOVECTOMY;  Surgeon: Romona Harari, MD;  Location: [facility] OR;  Service: Orthopedics;  Laterality: Right;  or MAC with regional block 60   THORACIC AORTIC ENDOVASCULAR STENT GRAFT N/A 01/10/2022   Procedure: THORACIC AORTIC ENDOVASCULAR STENT GRAFT;  Surgeon: Serene Gaile LELON, MD;  Location: [facility] OR;  Service: Vascular;  Laterality: N/A;   TRANSESOPHAGEAL ECHOCARDIOGRAM (CATH LAB) N/A 04/06/2023   Procedure: TRANSESOPHAGEAL ECHOCARDIOGRAM;  Surgeon: Barbaraann Darryle Ned, MD;  Location: [facility] INVASIVE CV LAB;  Service: Cardiovascular;  Laterality: N/A;   ulna nerve surgery  SH: Social History   Tobacco Use   Smoking status: Former    Current packs/day: 0.00    Average packs/day: 0.5 packs/day for 23.0 years (11.5 ttl pk-yrs)    Types: Cigarettes    Start date: 34    Quit date: 1989    Years since quitting: 36.5   Smokeless tobacco: Never  Vaping Use   Vaping status: Never Used  Substance Use Topics   Alcohol use: Not Currently    Comment: rarely   Drug use: No    ROS: Constitutional:  Negative for fever, chills,  weight loss CV: Negative for chest pain, previous MI, hypertension Respiratory:  Negative for shortness of breath, wheezing, sleep apnea, frequent cough GI:  Negative for nausea, vomiting, bloody stool, GERD  PE: There were no vitals taken for this visit. GENERAL APPEARANCE:  Well appearing, well developed, well nourished, NAD HEENT:  Atraumatic, normocephalic, oropharynx clear NECK:  Supple without lymphadenopathy or thyromegaly ABDOMEN:  Soft, non-tender, no masses EXTREMITIES:  Moves all extremities well, without clubbing, cyanosis, or edema NEUROLOGIC:  Alert and oriented x 3, normal gait, CN II-XII grossly intact MENTAL STATUS:  appropriate BACK:  Non-tender to palpation, No CVAT SKIN:  Warm, dry, and intact   Results: U/A:  PVR =

## 2023-07-02 NOTE — Progress Notes (Signed)
 Electrophysiology Office Note:   Date:  07/03/2023  ID:  Jason LELON Valdene Mickey., DOB 01-Dec-1945, MRN [id number]  Primary Cardiologist: None Electrophysiologist: Fonda Kitty, MD      History of Present Illness:   Jason Yuan. is a 78 y.o. male with a hx of paroxysmal atrial fibrillation S/P thoracic aortic aneurysm repair, chronic RBBB, CLL, hyperlipidemia, previous CVA, BPH, peptic ulcer disease, autoimmune hemolytic anemia who is being seen for follow-up evaluation posthospital discharge.  He recently underwent cardioversion during a hospitalization for atrial fibrillation. Since discharge, he has experienced no complaints and his heart rhythm has remained steady, with no episodes of irregular heartbeat. He notes that his pulse feels steady, unlike before the cardioversion when it was 'skipping all over the place.'He recalls experiencing symptoms of atrial fibrillation for a few weeks prior to hospitalization. During his hospital stay, an ultrasound of the heart showed a reduced ejection fraction of about 40%, and a transesophageal echocardiogram revealed a further reduced ejection fraction of 25-30%.He is currently taking metoprolol  four times a day, every six hours, but finds this regimen impractical. He feels fatigued, which he attributes to the medication, noting that he often takes doses 'an hour too late, an hour too early.' He is also on Eliquis  for stroke prevention.   Discussed the use of AI scribe software for clinical note transcription with the patient, who gave verbal consent to proceed.  History of Present Illness He has a history of atrial fibrillation. He feels 'pretty good' overall and has not detected any irregular heart rhythms himself. Fatigue is present on some days, which has been a long-standing issue for over twenty years. Recently, he wore a heart monitor for fourteen days, which recorded 534 episodes of premature atrial contractions, although no full episodes of  atrial fibrillation were experienced. He was previously hospitalized for atrial fibrillation, during which his heart was shocked back into normal rhythm. An ultrasound at that time showed his heart was not pumping effectively when out of rhythm, but returned to normal function once rhythm was restored. He is currently taking metoprolol  to manage his heart rate and rhythm, and Eliquis  to reduce the risk of stroke. He wants to return to physical activities such as walking and jogging and inquires about the impact of his heart condition on these activities.  Review of systems complete and found to be negative unless listed in HPI.   EP Information / Studies Reviewed:    EKG is not ordered today. EKG from 04/17/23 reviewed which showed sinus rhythm with RBBB and PVC      EKG 04/03/23: AF   Zio 05/13/23:  Patch Wear Time:  13 days and 23 hours (2025-04-11T13:26:32-0400 to 2025-04-25T13:26:24-0400)   HR 41 - 200, average 59 bpm. Frequent nonsustained SVT and 1 nonsustained VT (longest 8 beats). Sustained SVT also observed, longest episode 4 minutes and 3 seconds. No atrial fibrillation detected. Rare supraventricular ectopy. Occasional ventricular ectopy, 3.3%. There was 1 symptom trigger episode which corresponded to sinus with ectopy.  TEE 04/06/23:  1. Left ventricular ejection fraction, by estimation, is 25 to 30%. The  left ventricle has severely decreased function. The left ventricle  demonstrates global hypokinesis.   2. Right ventricular systolic function is moderately reduced. The right  ventricular size is moderately enlarged.   3. Left atrial size was severely dilated. No left atrial/left atrial  appendage thrombus was detected. The LAA emptying velocity was 20 cm/s.   4. Right atrial size was severely dilated.  5. The mitral valve is grossly normal. Trivial mitral valve  regurgitation. No evidence of mitral stenosis.   6. The aortic valve is tricuspid. Aortic valve regurgitation  is trivial.  No aortic stenosis is present.   7. Aortic dilatation noted. There is moderate dilatation of the ascending  aorta, measuring 42 mm.   8. Evidence of atrial level shunting detected by color flow Doppler.  There is a small patent foramen ovale with predominantly left to right  shunting across the atrial septum.   9. 3D performed of the LAA and 3D performed for the EF and demonstrates  No LAA thrombus and confirmed by 3D.   Echo 04/04/23:  1. Left ventricular ejection fraction, by estimation, is 45 to 50%. The  left ventricle has mildly decreased function. The left ventricle  demonstrates global hypokinesis. Left ventricular diastolic parameters are  indeterminate.   2. Right ventricular systolic function is low normal. The right  ventricular size is normal.   3. The mitral valve is grossly normal. No evidence of mitral valve  regurgitation. No evidence of mitral stenosis.   4. The aortic valve is tricuspid. Aortic valve regurgitation is not  visualized. Aortic valve sclerosis is present, with no evidence of aortic  valve stenosis.   5. Aortic dilatation noted. Aneurysm of the ascending aorta, measuring 49  mm. There is mild dilatation of the aortic root, measuring 42 mm.   6. The inferior vena cava is dilated in size with >50% respiratory  variability, suggesting right atrial pressure of 8 mmHg.   Echo 06/29/23:   1. Left ventricular ejection fraction, by estimation, is 50 to 55%. The  left ventricle has low normal function. The left ventricle has no regional  wall motion abnormalities. There is mild concentric left ventricular  hypertrophy. Left ventricular  diastolic parameters are consistent with Grade I diastolic dysfunction  (impaired relaxation).   2. Right ventricular systolic function was not well visualized. The right  ventricular size is not well visualized.   3. The mitral valve is normal in structure. Trivial mitral valve  regurgitation. No evidence of mitral  stenosis.   4. The aortic valve is tricuspid. Aortic valve regurgitation is not  visualized. Aortic valve sclerosis is present, with no evidence of aortic  valve stenosis.   5. Aortic dilatation noted. There is moderate dilatation of the ascending  aorta, measuring 47 mm.   Risk Assessment/Calculations:    CHA2DS2-VASc Score = 7   This indicates a 11.2% annual risk of stroke. The patient's score is based upon: CHF History: 1 HTN History: 1 Diabetes History: 0 Stroke History: 2 Vascular Disease History: 1 Age Score: 2 Gender Score: 0             Physical Exam:   VS:  BP 120/82   Pulse (!) 53   Ht 5' 10 (1.778 m)   Wt 194 lb (88 kg)   SpO2 91%   BMI 27.84 kg/m    Wt Readings from Last 3 Encounters:  07/02/23 194 lb (88 kg)  06/30/23 196 lb (88.9 kg)  05/04/23 200 lb (90.7 kg)     GEN: Well nourished, well developed in no acute distress NECK: No JVD CARDIAC: Bradycardic, regular rhythm. RESPIRATORY:  Clear to auscultation without rales, wheezing or rhonchi  ABDOMEN: Soft, non-distended EXTREMITIES:  No edema; No deformity   ASSESSMENT AND PLAN:    #.  Persistent atrial fibrillation: First episode occurred following thoracic aortic aneurysm repair.  Recent recurrence requiring hospitalization and  cardioversion.  Associated with decline in LVEF. Due to this reason, we have prioritized a rhythm control strategy. #.  Secondary hypercoagulable state due to atrial fibrillation: CHA2DS2-VASc score of 7. -Discussed treatment options today for AF including antiarrhythmic drug therapy and ablation. Discussed risks, recovery and likelihood of success with each treatment strategy. Risk, benefits, and alternatives to EP study and ablation for afib were discussed. These risks include but are not limited to stroke, bleeding, vascular damage, tamponade, perforation, damage to the esophagus, lungs, phrenic nerve and other structures, pulmonary vein stenosis, worsening renal function,  coronary vasospasm and death.  Discussed potential need for repeat ablation procedures and antiarrhythmic drugs after an initial ablation. The patient understands these risk and wishes to proceed.  We will therefore proceed with catheter ablation at the next available time.  Carto, ICE, anesthesia are requested for the procedure.  Will also obtain CT PV protocol prior to the procedure to exclude LAA thrombus and further evaluate atrial anatomy. -Continue metoprolol  XL 25 mg in the morning and 50 mg at bedtime.  -Continue Eliquis  5mg  BID.  #. SVT: 534 SVT episodes on Zio. 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  -Complete EP study at time of AF ablation. -Continue metoprolol  XL 25 mg in the morning and 50 mg at bedtime.   #.  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  LVEF in A-fib was 40 to 45% on TTE and 25% on TEE. Recovered on repeat TTE in sinus on 06/29/23 - EF 50-55%. Well compensated today.  Follow up with Dr. Kennyth 3 months after ablation.   Signed, Fonda Kennyth, MD

## 2023-07-02 NOTE — Patient Instructions (Addendum)
 Medication Instructions:  Your physician recommends that you continue on your current medications as directed. Please refer to the Current Medication list given to you today.  *If you need a refill on your cardiac medications before your next appointment, please call your pharmacy*  Lab Work: BMET and CBC - you may go to any [facility] location to have these drawn within 30 days of your procedure (anytime after August 10th)  Testing/Procedures: Cardiac CT Your physician has requested that you have cardiac CT. Cardiac computed tomography (CT) is a painless test that uses an x-ray machine to take clear, detailed pictures of your heart. For further information please visit [email or web address] Please follow instruction sheet as given. We will call you to schedule your CT scan. It will be done about three weeks prior to your ablation.  Ablation Your physician has recommended that you have an ablation. Catheter ablation is a medical procedure used to treat some cardiac arrhythmias (irregular heartbeats). During catheter ablation, a long, thin, flexible tube is put into a blood vessel in your groin (upper thigh), or neck. This tube is called an ablation catheter. It is then guided to your heart through the blood vessel. Radio frequency waves destroy small areas of heart tissue where abnormal heartbeats may cause an arrhythmia to start.  You are scheduled for Atrial Fibrillation Ablation on Monday, September 8 with Dr. Sidra Kitty.Please arrive at the Main Entrance A at [facility]: [address] [place], [place] [address] at 10:30 AM   Follow-Up: At [facility], you and your health needs are our priority.  As part of our continuing mission to provide you with exceptional heart care, we have created designated Provider Care Teams.  These Care Teams include your primary Cardiologist (physician) and Advanced Practice Providers (APPs -  Physician Assistants and Nurse Practitioners) who all  work together to provide you with the care you need, when you need it.   Your next appointment:   We will contact you about your post-procedure follow up appointments.

## 2023-07-24 ENCOUNTER — Other Ambulatory Visit: Payer: Self-pay | Admitting: Urology

## 2023-07-24 DIAGNOSIS — N401 Enlarged prostate with lower urinary tract symptoms: Secondary | ICD-10-CM

## 2023-07-24 DIAGNOSIS — N390 Urinary tract infection, site not specified: Secondary | ICD-10-CM

## 2023-07-24 DIAGNOSIS — R339 Retention of urine, unspecified: Secondary | ICD-10-CM

## 2023-07-28 ENCOUNTER — Encounter: Payer: Self-pay | Admitting: Hematology & Oncology

## 2023-07-29 ENCOUNTER — Other Ambulatory Visit: Payer: Self-pay | Admitting: *Deleted

## 2023-07-29 DIAGNOSIS — D591 Autoimmune hemolytic anemia, unspecified: Secondary | ICD-10-CM

## 2023-07-29 DIAGNOSIS — C9110 Chronic lymphocytic leukemia of B-cell type not having achieved remission: Secondary | ICD-10-CM

## 2023-07-29 DIAGNOSIS — D696 Thrombocytopenia, unspecified: Secondary | ICD-10-CM

## 2023-07-29 DIAGNOSIS — D509 Iron deficiency anemia, unspecified: Secondary | ICD-10-CM

## 2023-07-29 MED ORDER — PREDNISONE 10 MG PO TABS: 10.0000 mg | ORAL_TABLET | Freq: Every day | ORAL | 1 refills | Status: DC

## 2023-08-10 ENCOUNTER — Other Ambulatory Visit: Payer: Self-pay

## 2023-08-10 DIAGNOSIS — R339 Retention of urine, unspecified: Secondary | ICD-10-CM

## 2023-08-10 DIAGNOSIS — N401 Enlarged prostate with lower urinary tract symptoms: Secondary | ICD-10-CM

## 2023-08-10 MED ORDER — APIXABAN 5 MG PO TABS: 5.0000 mg | ORAL_TABLET | Freq: Two times a day (BID) | ORAL | 1 refills | Status: AC

## 2023-08-10 MED ORDER — FINASTERIDE 5 MG PO TABS: 5.0000 mg | ORAL_TABLET | Freq: Every day | ORAL | 0 refills | Status: AC

## 2023-08-10 NOTE — Telephone Encounter (Signed)
 Prescription refill request for Eliquis  received. Indication:afib Last office visit:6/25 Scr:1.20  6/25 Age: 78 Weight:88  kg  Prescription refilled

## 2023-08-11 DIAGNOSIS — H9313 Tinnitus, bilateral: Secondary | ICD-10-CM | POA: Diagnosis not present

## 2023-08-11 DIAGNOSIS — H903 Sensorineural hearing loss, bilateral: Secondary | ICD-10-CM | POA: Diagnosis not present

## 2023-08-11 DIAGNOSIS — H838X3 Other specified diseases of inner ear, bilateral: Secondary | ICD-10-CM | POA: Diagnosis not present

## 2023-08-12 ENCOUNTER — Inpatient Hospital Stay: Attending: Hematology & Oncology

## 2023-08-12 ENCOUNTER — Other Ambulatory Visit: Payer: Self-pay

## 2023-08-12 DIAGNOSIS — D696 Thrombocytopenia, unspecified: Secondary | ICD-10-CM

## 2023-08-12 DIAGNOSIS — D591 Autoimmune hemolytic anemia, unspecified: Secondary | ICD-10-CM | POA: Insufficient documentation

## 2023-08-12 DIAGNOSIS — Z7901 Long term (current) use of anticoagulants: Secondary | ICD-10-CM | POA: Insufficient documentation

## 2023-08-12 DIAGNOSIS — I480 Paroxysmal atrial fibrillation: Secondary | ICD-10-CM | POA: Insufficient documentation

## 2023-08-12 DIAGNOSIS — C9110 Chronic lymphocytic leukemia of B-cell type not having achieved remission: Secondary | ICD-10-CM | POA: Insufficient documentation

## 2023-08-12 LAB — CBC WITH DIFFERENTIAL (CANCER CENTER ONLY)
Abs Immature Granulocytes: 0.05 K/uL (ref 0.00–0.07)
Basophils Absolute: 0 K/uL (ref 0.0–0.1)
Basophils Relative: 0 %
Eosinophils Absolute: 0.1 K/uL (ref 0.0–0.5)
Eosinophils Relative: 1 %
HCT: 42.7 % (ref 39.0–52.0)
Hemoglobin: 14.9 g/dL (ref 13.0–17.0)
Immature Granulocytes: 1 %
Lymphocytes Relative: 34 %
Lymphs Abs: 3.6 K/uL (ref 0.7–4.0)
MCH: 34.6 pg — ABNORMAL HIGH (ref 26.0–34.0)
MCHC: 34.9 g/dL (ref 30.0–36.0)
MCV: 99.1 fL (ref 80.0–100.0)
Monocytes Absolute: 0.5 K/uL (ref 0.1–1.0)
Monocytes Relative: 5 %
Neutro Abs: 6.3 K/uL (ref 1.7–7.7)
Neutrophils Relative %: 59 %
Platelet Count: 91 K/uL — ABNORMAL LOW (ref 150–400)
RBC: 4.31 MIL/uL (ref 4.22–5.81)
RDW: 13.8 % (ref 11.5–15.5)
WBC Count: 10.7 K/uL — ABNORMAL HIGH (ref 4.0–10.5)
nRBC: 0 % (ref 0.0–0.2)

## 2023-08-12 LAB — CMP (CANCER CENTER ONLY)
ALT: 17 U/L (ref 0–44)
AST: 19 U/L (ref 15–41)
Albumin: 4.3 g/dL (ref 3.5–5.0)
Alkaline Phosphatase: 57 U/L (ref 38–126)
Anion gap: 10 (ref 5–15)
BUN: 18 mg/dL (ref 8–23)
CO2: 29 mmol/L (ref 22–32)
Calcium: 9.9 mg/dL (ref 8.9–10.3)
Chloride: 102 mmol/L (ref 98–111)
Creatinine: 1.08 mg/dL (ref 0.61–1.24)
GFR, Estimated: >60 mL/min (ref >60)
Glucose, Bld: 108 mg/dL — ABNORMAL HIGH (ref 70–99)
Potassium: 4.3 mmol/L (ref 3.5–5.1)
Sodium: 141 mmol/L (ref 135–145)
Total Bilirubin: 0.6 mg/dL (ref 0.0–1.2)
Total Protein: 6.3 g/dL — ABNORMAL LOW (ref 6.5–8.1)

## 2023-08-13 ENCOUNTER — Ambulatory Visit: Payer: Self-pay | Admitting: Medical Oncology

## 2023-08-24 ENCOUNTER — Telehealth: Payer: Self-pay

## 2023-08-24 NOTE — Telephone Encounter (Signed)
 Spoke with pt and went over CT/Ablation instructions. He is aware that he can get his labs done while here for his CT on 8/21. I went over medication instructions with him.  Instruction letters have been sent via MyChart. Pt will review and let us  know if he has any questions or concerns.

## 2023-08-27 ENCOUNTER — Ambulatory Visit (HOSPITAL_COMMUNITY)
Admission: RE | Admit: 2023-08-27 | Discharge: 2023-08-27 | Disposition: A | Discharge status: Home / Self Care | Source: Ambulatory Visit | Attending: Cardiology | Admitting: Cardiology

## 2023-08-27 DIAGNOSIS — I4819 Other persistent atrial fibrillation: Secondary | ICD-10-CM | POA: Diagnosis not present

## 2023-08-27 MED ORDER — IOHEXOL 350 MG/ML SOLN
80.0000 mL | Freq: Once | INTRAVENOUS | Status: AC | PRN
  Administered 2023-08-27: 80 mL via INTRAVENOUS

## 2023-08-29 ENCOUNTER — Other Ambulatory Visit: Payer: Self-pay | Admitting: Urology

## 2023-08-29 DIAGNOSIS — N390 Urinary tract infection, site not specified: Secondary | ICD-10-CM

## 2023-08-29 DIAGNOSIS — N401 Enlarged prostate with lower urinary tract symptoms: Secondary | ICD-10-CM

## 2023-08-29 DIAGNOSIS — R339 Retention of urine, unspecified: Secondary | ICD-10-CM

## 2023-08-31 ENCOUNTER — Encounter: Payer: Self-pay | Admitting: Hematology & Oncology

## 2023-08-31 ENCOUNTER — Inpatient Hospital Stay (HOSPITAL_BASED_OUTPATIENT_CLINIC_OR_DEPARTMENT_OTHER): Admitting: Hematology & Oncology

## 2023-08-31 ENCOUNTER — Inpatient Hospital Stay

## 2023-08-31 ENCOUNTER — Other Ambulatory Visit: Payer: Self-pay

## 2023-08-31 VITALS — BP 123/71 | HR 62 | Temp 97.7°F | Resp 16 | Ht 70.0 in | Wt 204.0 lb

## 2023-08-31 DIAGNOSIS — Z7901 Long term (current) use of anticoagulants: Secondary | ICD-10-CM | POA: Diagnosis not present

## 2023-08-31 DIAGNOSIS — D591 Autoimmune hemolytic anemia, unspecified: Secondary | ICD-10-CM | POA: Diagnosis not present

## 2023-08-31 DIAGNOSIS — C9110 Chronic lymphocytic leukemia of B-cell type not having achieved remission: Secondary | ICD-10-CM

## 2023-08-31 DIAGNOSIS — D509 Iron deficiency anemia, unspecified: Secondary | ICD-10-CM

## 2023-08-31 DIAGNOSIS — I480 Paroxysmal atrial fibrillation: Secondary | ICD-10-CM | POA: Diagnosis not present

## 2023-08-31 DIAGNOSIS — D696 Thrombocytopenia, unspecified: Secondary | ICD-10-CM

## 2023-08-31 LAB — CBC WITH DIFFERENTIAL (CANCER CENTER ONLY)
Abs Granulocyte: 6.2 K/uL (ref 1.5–6.5)
Abs Immature Granulocytes: 0.05 K/uL (ref 0.00–0.07)
Basophils Absolute: 0.1 K/uL (ref 0.0–0.1)
Basophils Relative: 1 %
Eosinophils Absolute: 0.1 K/uL (ref 0.0–0.5)
Eosinophils Relative: 1 %
HCT: 44.7 % (ref 39.0–52.0)
Hemoglobin: 15.4 g/dL (ref 13.0–17.0)
Immature Granulocytes: 1 %
Lymphocytes Relative: 34 %
Lymphs Abs: 3.8 K/uL (ref 0.7–4.0)
MCH: 34.5 pg — ABNORMAL HIGH (ref 26.0–34.0)
MCHC: 34.5 g/dL (ref 30.0–36.0)
MCV: 100 fL (ref 80.0–100.0)
Monocytes Absolute: 0.9 K/uL (ref 0.1–1.0)
Monocytes Relative: 8 %
Neutro Abs: 6.2 K/uL (ref 1.7–7.7)
Neutrophils Relative %: 55 %
Platelet Count: 100 K/uL — ABNORMAL LOW (ref 150–400)
RBC: 4.47 MIL/uL (ref 4.22–5.81)
RDW: 14.1 % (ref 11.5–15.5)
WBC Count: 11 K/uL — ABNORMAL HIGH (ref 4.0–10.5)
nRBC: 0 % (ref 0.0–0.2)

## 2023-08-31 LAB — CMP (CANCER CENTER ONLY)
ALT: 16 U/L (ref 0–44)
AST: 19 U/L (ref 15–41)
Albumin: 4.6 g/dL (ref 3.5–5.0)
Alkaline Phosphatase: 64 U/L (ref 38–126)
Anion gap: 13 (ref 5–15)
BUN: 21 mg/dL (ref 8–23)
CO2: 28 mmol/L (ref 22–32)
Calcium: 10 mg/dL (ref 8.9–10.3)
Chloride: 103 mmol/L (ref 98–111)
Creatinine: 1.15 mg/dL (ref 0.61–1.24)
GFR, Estimated: >60 mL/min (ref >60)
Glucose, Bld: 131 mg/dL — ABNORMAL HIGH (ref 70–99)
Potassium: 4.2 mmol/L (ref 3.5–5.1)
Sodium: 144 mmol/L (ref 135–145)
Total Bilirubin: 0.5 mg/dL (ref 0.0–1.2)
Total Protein: 6.6 g/dL (ref 6.5–8.1)

## 2023-08-31 LAB — RETIC PANEL
Immature Retic Fract: 23 % — ABNORMAL HIGH (ref 2.3–15.9)
RBC.: 4.46 MIL/uL (ref 4.22–5.81)
Retic Count, Absolute: 113.3 K/uL (ref 19.0–186.0)
Retic Ct Pct: 2.5 % (ref 0.4–3.1)
Reticulocyte Hemoglobin: 36.3 pg (ref >27.9)

## 2023-08-31 LAB — IRON AND IRON BINDING CAPACITY (CC-WL,HP ONLY)
Iron: 111 ug/dL (ref 45–182)
Saturation Ratios: 31 % (ref 17.9–39.5)
TIBC: 360 ug/dL (ref 250–450)
UIBC: 249 ug/dL

## 2023-08-31 LAB — FERRITIN: Ferritin: 56 ng/mL (ref 24–336)

## 2023-08-31 LAB — LACTATE DEHYDROGENASE: LDH: 214 U/L — ABNORMAL HIGH (ref 98–192)

## 2023-08-31 LAB — SAVE SMEAR(SSMR), FOR PROVIDER SLIDE REVIEW

## 2023-08-31 NOTE — Progress Notes (Signed)
 Hematology and Oncology Follow Up Visit  Jason Abbott [id number] August 16, 1945 78 y.o. 08/31/2023   Principle Diagnosis:  Stage C CLL -autoimmune hemolytic anemia Atrial fibrillation -paroxysmal  Current Therapy:   Prednisone  80 mg p.o. daily-started on 08/20/2022 --decrease to 40 mg p.o. daily on 09/04/2022 Prednisone  10 mg p.o. daily Rituxan /Bendamustine -start treatment on 11/20/2022 -status post cycle 1-DC on 12/18/2022 due to poor tolerance Eliquis  5 mg p.o. twice daily-started on 04/06/2023   Interim History:  Jason Abbott is here today for follow-up.  He does feel tired.  I had believe this is secondary to the atrial fibrillation.  It sounds like he is probably going to have an ablation.  This will be on 09/17/2023.  Hopefully this will get him back into rhythm so he will feel better.  He has had no issues with bleeding.  He is on the Eliquis  for the atrial fibrillation.  He has had no nausea or vomiting.  He has had no change in bowel or bladder habits.  There has been no rashes.  He has had no bleeding.  He has had no swollen lymph nodes.  Overall, I would have to say that his performance status is ECOG 1   Wt Readings from Last 3 Encounters:  08/31/23 204 lb (92.5 kg)  07/02/23 194 lb (88 kg)  06/30/23 196 lb (88.9 kg)     Medications:  Allergies as of 08/31/2023       Reactions   Levaquin  [levofloxacin ] Other (See Comments)   Hx of aortic aneurysm, has since been repaired.        Medication List        Accurate as of August 31, 2023  2:07 PM. If you have any questions, ask your nurse or doctor.          apixaban  5 MG Tabs tablet Commonly known as: ELIQUIS  Take 1 tablet (5 mg total) by mouth 2 (two) times daily.   AZO CRANBERRY PO Take 1 capsule by mouth daily with supper.   cephALEXin  250 MG capsule Commonly known as: KEFLEX  Take 1 capsule (250 mg total) by mouth daily.   cyanocobalamin  1000 MCG tablet Commonly known as: VITAMIN B12 Take 1,000 mcg  by mouth daily.   famciclovir  250 MG tablet Commonly known as: FAMVIR  Take 1 tablet (250 mg total) by mouth 2 (two) times daily.   ferrous sulfate 325 (65 FE) MG tablet Take 325 mg by mouth daily.   finasteride  5 MG tablet Commonly known as: PROSCAR  Take 1 tablet (5 mg total) by mouth daily.   fluconazole  100 MG tablet Commonly known as: DIFLUCAN  Take 1 tablet (100 mg total) by mouth daily.   folic acid  1 MG tablet Commonly known as: FOLVITE  Take 2 tablets (2 mg total) by mouth daily.   Mens Multivitamin Gummies Chew Chew 2 each by mouth daily.   metoprolol  succinate 25 MG 24 hr tablet Commonly known as: Toprol  XL Take 1 tablet (25 mg total) by mouth in the morning AND 2 tablets (50 mg total) at bedtime.   predniSONE  10 MG tablet Commonly known as: DELTASONE  Take 1 tablet (10 mg total) by mouth daily with breakfast.   tamsulosin  0.4 MG Caps capsule Commonly known as: FLOMAX  Take 1 capsule (0.4 mg total) by mouth in the morning and at bedtime.   Unisom  Sleepgels 50 MG Caps Generic drug: diphenhydrAMINE  HCl (Sleep) Take 50 mg by mouth at bedtime.   Vitamin D3 125 MCG (5000 UT) Caps Take 5,000 Units  by mouth daily with supper.        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     Past Medical History, Surgical history, Social history, and Family History were reviewed and updated.  Review of Systems: Review of Systems  Constitutional:  Positive for malaise/fatigue.  HENT: Negative.    Eyes: Negative.   Respiratory: Negative.    Cardiovascular: Negative.   Gastrointestinal: Negative.   Genitourinary: Negative.   Musculoskeletal: Negative.   Skin: Negative.   Neurological: Negative.   Endo/Heme/Allergies: Negative.   Psychiatric/Behavioral: Negative.       Physical Exam: Vitals:   08/31/23 1355  BP: 123/71  Pulse: 62  Resp: 16  Temp: 97.7 F (36.5  C)  SpO2: 96%    Wt Readings from Last 3 Encounters:  08/31/23 204 lb (92.5 kg)  07/02/23 194 lb (88 kg)  06/30/23 196 lb (88.9 kg)    Physical Exam Vitals reviewed.  HENT:     Head: Normocephalic and atraumatic.  Eyes:     Pupils: Pupils are equal, round, and reactive to light.  Cardiovascular:     Rate and Rhythm: Normal rate and regular rhythm.     Heart sounds: Normal heart sounds.  Pulmonary:     Effort: Pulmonary effort is normal.     Breath sounds: Normal breath sounds.  Abdominal:     General: Bowel sounds are normal.     Palpations: Abdomen is soft.  Musculoskeletal:        General: No tenderness or deformity. Normal range of motion.     Cervical back: Normal range of motion.  Lymphadenopathy:     Cervical: No cervical adenopathy.  Skin:    General: Skin is warm and dry.     Findings: No erythema or rash.  Neurological:     Mental Status: He is alert and oriented to person, place, and time.  Psychiatric:        Behavior: Behavior normal.        Thought Content: Thought content normal.        Judgment: Judgment normal.      Lab Results  Component Value Date   WBC 10.7 (H) 08/12/2023   HGB 14.9 08/12/2023   HCT 42.7 08/12/2023   MCV 99.1 08/12/2023   PLT 91 (L) 08/12/2023   Lab Results  Component Value Date   FERRITIN 18 (L) 02/17/2023   IRON 74 02/17/2023   TIBC 419 02/17/2023   UIBC 345 02/17/2023   IRONPCTSAT 18 02/17/2023   Lab Results  Component Value Date   RETICCTPCT 2.5 08/31/2023   RBC 4.46 08/31/2023   Lab Results  Component Value Date   KPAFRELGTCHN 12.8 08/26/2021   LAMBDASER 24.9 08/26/2021   KAPLAMBRATIO 0.51 08/26/2021   Lab Results  Component Value Date   IGGSERUM 808 08/26/2021   IGA 149 08/26/2021   IGMSERUM 39 08/26/2021   No results found for: STEPHANY CARLOTA BENSON MARKEL EARLA JOANNIE DOC VICK, SPEI   Chemistry      Component Value Date/Time   NA 141 08/12/2023 1338   NA 142  11/05/2022 1127   K 4.3 08/12/2023 1338   CL 102 08/12/2023 1338   CO2 29 08/12/2023 1338   BUN 18 08/12/2023 1338   BUN 18 11/05/2022 1127   CREATININE 1.08 08/12/2023 1338   CREATININE 1.03 08/15/2021 0000      Component Value Date/Time  CALCIUM  9.9 08/12/2023 1338   ALKPHOS 57 08/12/2023 1338   AST 19 08/12/2023 1338   ALT 17 08/12/2023 1338   BILITOT 0.6 08/12/2023 1338       Impression and Plan: Mr. Pickup is a very pleasant 78 yo caucasian gentleman with stage C CLL. He developed autoimmune hemolytic anemia.  We started him on prednisone  which he  tolerated well.   We then gave him 1 cycle of Rituxan /Bendamustine .  He tolerated this incredibly poorly.  Since then, we have just watched him.  Everything still looks fantastic.  I do not see any abnormalities with respect to the CLL.  Not sure that he still has a low level of CLL but for right now, I do not see any indication that we have to embark upon therapy.  I do not see any problem with him having the ablation for the atrial fibrillation.  Again, if this gets him back into rhythm he will feel a whole lot better.  I would like to get him back in another 3 months.  I think this would be reasonable.  Maude JONELLE Crease, MD 8/25/20252:07 PM

## 2023-09-03 ENCOUNTER — Ambulatory Visit: Admitting: Sports Medicine

## 2023-09-04 ENCOUNTER — Ambulatory Visit: Admitting: Urgent Care

## 2023-09-07 ENCOUNTER — Emergency Department (HOSPITAL_BASED_OUTPATIENT_CLINIC_OR_DEPARTMENT_OTHER)

## 2023-09-07 ENCOUNTER — Other Ambulatory Visit: Payer: Self-pay

## 2023-09-07 ENCOUNTER — Encounter (HOSPITAL_BASED_OUTPATIENT_CLINIC_OR_DEPARTMENT_OTHER): Payer: Self-pay | Admitting: Emergency Medicine

## 2023-09-07 ENCOUNTER — Inpatient Hospital Stay (HOSPITAL_BASED_OUTPATIENT_CLINIC_OR_DEPARTMENT_OTHER)
Admission: EM | Admit: 2023-09-07 | Discharge: 2023-09-09 | DRG: 308 | Disposition: A | Discharge status: Home / Self Care | Attending: Internal Medicine | Admitting: Internal Medicine

## 2023-09-07 DIAGNOSIS — Z881 Allergy status to other antibiotic agents status: Secondary | ICD-10-CM | POA: Diagnosis not present

## 2023-09-07 DIAGNOSIS — E872 Acidosis, unspecified: Secondary | ICD-10-CM | POA: Diagnosis not present

## 2023-09-07 DIAGNOSIS — D591 Autoimmune hemolytic anemia, unspecified: Secondary | ICD-10-CM | POA: Diagnosis present

## 2023-09-07 DIAGNOSIS — D696 Thrombocytopenia, unspecified: Secondary | ICD-10-CM | POA: Diagnosis not present

## 2023-09-07 DIAGNOSIS — E782 Mixed hyperlipidemia: Secondary | ICD-10-CM | POA: Diagnosis not present

## 2023-09-07 DIAGNOSIS — Z95818 Presence of other cardiac implants and grafts: Secondary | ICD-10-CM

## 2023-09-07 DIAGNOSIS — I428 Other cardiomyopathies: Secondary | ICD-10-CM | POA: Diagnosis not present

## 2023-09-07 DIAGNOSIS — Z7952 Long term (current) use of systemic steroids: Secondary | ICD-10-CM | POA: Diagnosis not present

## 2023-09-07 DIAGNOSIS — C9110 Chronic lymphocytic leukemia of B-cell type not having achieved remission: Secondary | ICD-10-CM | POA: Diagnosis not present

## 2023-09-07 DIAGNOSIS — Z9889 Other specified postprocedural states: Secondary | ICD-10-CM | POA: Diagnosis not present

## 2023-09-07 DIAGNOSIS — I480 Paroxysmal atrial fibrillation: Secondary | ICD-10-CM | POA: Diagnosis present

## 2023-09-07 DIAGNOSIS — R531 Weakness: Secondary | ICD-10-CM | POA: Diagnosis not present

## 2023-09-07 DIAGNOSIS — I5043 Acute on chronic combined systolic (congestive) and diastolic (congestive) heart failure: Secondary | ICD-10-CM | POA: Diagnosis not present

## 2023-09-07 DIAGNOSIS — Z8673 Personal history of transient ischemic attack (TIA), and cerebral infarction without residual deficits: Secondary | ICD-10-CM

## 2023-09-07 DIAGNOSIS — Z833 Family history of diabetes mellitus: Secondary | ICD-10-CM

## 2023-09-07 DIAGNOSIS — R0602 Shortness of breath: Secondary | ICD-10-CM | POA: Diagnosis not present

## 2023-09-07 DIAGNOSIS — I509 Heart failure, unspecified: Secondary | ICD-10-CM | POA: Diagnosis present

## 2023-09-07 DIAGNOSIS — I5033 Acute on chronic diastolic (congestive) heart failure: Secondary | ICD-10-CM | POA: Insufficient documentation

## 2023-09-07 DIAGNOSIS — Z743 Need for continuous supervision: Secondary | ICD-10-CM | POA: Diagnosis not present

## 2023-09-07 DIAGNOSIS — Z87891 Personal history of nicotine dependence: Secondary | ICD-10-CM | POA: Diagnosis not present

## 2023-09-07 DIAGNOSIS — I4891 Unspecified atrial fibrillation: Secondary | ICD-10-CM | POA: Diagnosis not present

## 2023-09-07 DIAGNOSIS — E785 Hyperlipidemia, unspecified: Secondary | ICD-10-CM | POA: Diagnosis present

## 2023-09-07 DIAGNOSIS — Z7901 Long term (current) use of anticoagulants: Secondary | ICD-10-CM | POA: Diagnosis not present

## 2023-09-07 DIAGNOSIS — N400 Enlarged prostate without lower urinary tract symptoms: Secondary | ICD-10-CM | POA: Diagnosis not present

## 2023-09-07 DIAGNOSIS — Z8679 Personal history of other diseases of the circulatory system: Secondary | ICD-10-CM

## 2023-09-07 DIAGNOSIS — I110 Hypertensive heart disease with heart failure: Secondary | ICD-10-CM | POA: Diagnosis not present

## 2023-09-07 DIAGNOSIS — I4819 Other persistent atrial fibrillation: Principal | ICD-10-CM | POA: Diagnosis present

## 2023-09-07 DIAGNOSIS — Z8616 Personal history of COVID-19: Secondary | ICD-10-CM

## 2023-09-07 DIAGNOSIS — R002 Palpitations: Secondary | ICD-10-CM | POA: Diagnosis present

## 2023-09-07 DIAGNOSIS — I2510 Atherosclerotic heart disease of native coronary artery without angina pectoris: Secondary | ICD-10-CM | POA: Diagnosis present

## 2023-09-07 DIAGNOSIS — I5023 Acute on chronic systolic (congestive) heart failure: Secondary | ICD-10-CM

## 2023-09-07 DIAGNOSIS — J9811 Atelectasis: Secondary | ICD-10-CM | POA: Diagnosis not present

## 2023-09-07 DIAGNOSIS — R0989 Other specified symptoms and signs involving the circulatory and respiratory systems: Secondary | ICD-10-CM | POA: Diagnosis not present

## 2023-09-07 LAB — CBC
HCT: 44.8 % (ref 39.0–52.0)
Hemoglobin: 15.5 g/dL (ref 13.0–17.0)
MCH: 34 pg (ref 26.0–34.0)
MCHC: 34.6 g/dL (ref 30.0–36.0)
MCV: 98.2 fL (ref 80.0–100.0)
Platelets: 107 K/uL — ABNORMAL LOW (ref 150–400)
RBC: 4.56 MIL/uL (ref 4.22–5.81)
RDW: 14 % (ref 11.5–15.5)
WBC: 10.5 K/uL (ref 4.0–10.5)
nRBC: 0 % (ref 0.0–0.2)

## 2023-09-07 LAB — BASIC METABOLIC PANEL WITH GFR
Anion gap: 13 (ref 5–15)
BUN: 21 mg/dL (ref 8–23)
CO2: 28 mmol/L (ref 22–32)
Calcium: 10.7 mg/dL — ABNORMAL HIGH (ref 8.9–10.3)
Chloride: 99 mmol/L (ref 98–111)
Creatinine, Ser: 1.19 mg/dL (ref 0.61–1.24)
GFR, Estimated: >60 mL/min (ref >60)
Glucose, Bld: 136 mg/dL — ABNORMAL HIGH (ref 70–99)
Potassium: 4.7 mmol/L (ref 3.5–5.1)
Sodium: 140 mmol/L (ref 135–145)

## 2023-09-07 LAB — PRO BRAIN NATRIURETIC PEPTIDE: Pro Brain Natriuretic Peptide: 2875 pg/mL — ABNORMAL HIGH (ref <300.0)

## 2023-09-07 LAB — MAGNESIUM: Magnesium: 2 mg/dL (ref 1.7–2.4)

## 2023-09-07 MED ORDER — METOPROLOL SUCCINATE ER 50 MG PO TB24
50.0000 mg | ORAL_TABLET | Freq: Every day | ORAL | Status: DC
  Administered 2023-09-08 (×2): 50 mg via ORAL
  Filled 2023-09-07 (×2): qty 1

## 2023-09-07 MED ORDER — FOLIC ACID 1 MG PO TABS
2.0000 mg | ORAL_TABLET | Freq: Every day | ORAL | Status: DC
  Administered 2023-09-08 – 2023-09-09 (×2): 2 mg via ORAL
  Filled 2023-09-07: qty 2

## 2023-09-07 MED ORDER — FAMCICLOVIR 500 MG PO TABS
250.0000 mg | ORAL_TABLET | Freq: Two times a day (BID) | ORAL | Status: DC
  Administered 2023-09-08 – 2023-09-09 (×3): 250 mg via ORAL
  Filled 2023-09-07 (×5): qty 0.5

## 2023-09-07 MED ORDER — PREDNISONE 10 MG PO TABS
10.0000 mg | ORAL_TABLET | Freq: Every day | ORAL | Status: DC
  Administered 2023-09-08 – 2023-09-09 (×2): 10 mg via ORAL
  Filled 2023-09-07 (×2): qty 1

## 2023-09-07 MED ORDER — FINASTERIDE 5 MG PO TABS
5.0000 mg | ORAL_TABLET | Freq: Every day | ORAL | Status: DC
  Administered 2023-09-08 – 2023-09-09 (×2): 5 mg via ORAL
  Filled 2023-09-07 (×2): qty 1

## 2023-09-07 MED ORDER — TAMSULOSIN HCL 0.4 MG PO CAPS
0.4000 mg | ORAL_CAPSULE | Freq: Two times a day (BID) | ORAL | Status: DC
  Administered 2023-09-08 – 2023-09-09 (×4): 0.4 mg via ORAL
  Filled 2023-09-07 (×4): qty 1

## 2023-09-07 MED ORDER — ACETAMINOPHEN 650 MG RE SUPP: 650.0000 mg | Freq: Four times a day (QID) | RECTAL | Status: DC | PRN

## 2023-09-07 MED ORDER — METOPROLOL SUCCINATE ER 25 MG PO TB24
25.0000 mg | ORAL_TABLET | Freq: Every day | ORAL | Status: DC
  Administered 2023-09-08 – 2023-09-09 (×2): 25 mg via ORAL
  Filled 2023-09-07 (×2): qty 1

## 2023-09-07 MED ORDER — ACETAMINOPHEN 325 MG PO TABS: 650.0000 mg | ORAL_TABLET | Freq: Four times a day (QID) | ORAL | Status: DC | PRN

## 2023-09-07 MED ORDER — APIXABAN 5 MG PO TABS
5.0000 mg | ORAL_TABLET | Freq: Two times a day (BID) | ORAL | Status: DC
  Administered 2023-09-08 – 2023-09-09 (×4): 5 mg via ORAL
  Filled 2023-09-07 (×4): qty 1

## 2023-09-07 MED ORDER — FUROSEMIDE 10 MG/ML IJ SOLN
40.0000 mg | Freq: Once | INTRAMUSCULAR | Status: AC
  Administered 2023-09-07: 40 mg via INTRAVENOUS
  Filled 2023-09-07: qty 4

## 2023-09-07 NOTE — ED Triage Notes (Signed)
 Pt endorses afib on [facility] mobile. Reports mild shob, fatigue,  denies CP

## 2023-09-07 NOTE — H&P (Signed)
 History and Physical    Jason Abbott. FMW:[id number] DOB: [birth year] DOA: 09/07/2023  Patient coming from: Home.  Chief Complaint: Shortness of breath.  HPI: Jason Abbott. is a 78 y.o. male with history of atrial fibrillation, tachycardia induced cardiomyopathy with improved EF, CLL and autoimmune hemolytic anemia being followed by Dr. Timmy, BPH presents to the ER with complaints of tachycardia.  Patient states over the last 2 to 3 days patient has become increasingly short of breath with minimal exertion.  Denies chest pain productive cough fever or chills.  Has been compliant with his medications.  Earlier today when he checked his heart rate with an external device found to be in the 140s.  And this prompted him to come to the ER.  Patient states he has gained at least 5 pounds in the last 1 month and feels that his abdomen is bloated.  Patient states he is scheduled for ablation for his A-fib on September 17, 2023 with Dr. Fonda Kitty.  ED Course: In the ER chest x-ray was unremarkable.  BNP is 2870.  EKG shows A-fib rate of 114 bpm.  Given the symptoms ER physician discussed with cardiologist who recommended giving 40 mg IV Lasix .  CBC shows hemoglobin of 15 platelets 207 creatinine is 1.1 calcium  10.7.  Potassium 4.7.  At the time of my exam patient is in A-fib heart rate is around 110 bpm.  Review of Systems: As per HPI, rest all negative.   Past Medical History:  Diagnosis Date   Aberrant right subclavian artery    Aortic arch aneurysm (HCC)    Arthritis    Atrial fibrillation with rapid ventricular response (HCC) 04/03/2023   Bowel obstruction (HCC)    CLL (chronic lymphocytic leukemia) (HCC)    COVID 2021   hospitalized for it   Diverticul disease small and large intestine, no perforati or abscess    GERD (gastroesophageal reflux disease)    uses tums as needed   H/O urinary infection    Pneumonia 09/22/2021   Shingles outbreak 10/03/2013   Stroke (HCC)  01/2019    Past Surgical History:  Procedure Laterality Date   AORTIC ARCH DEBRANCHING N/A 01/10/2022   Procedure: AORTIC ARCH DEBRANCHING WITH 16 X 8 X 10 MM HEMASHIELD GOLD GRAFT;  Surgeon: Lucas Dorise POUR, MD;  Location: [facility] OR;  Service: Open Heart Surgery;  Laterality: N/A;   CARDIOVERSION N/A 04/06/2023   Procedure: CARDIOVERSION;  Surgeon: Barbaraann Darryle Ned, MD;  Location: [facility] INVASIVE CV LAB;  Service: Cardiovascular;  Laterality: N/A;   CAROTID-SUBCLAVIAN BYPASS GRAFT Right 12/18/2021   Procedure: RIGHT BYPASS GRAFT CAROTID-SUBCLAVIAN;  Surgeon: Serene Gaile LELON, MD;  Location: [facility] OR;  Service: Vascular;  Laterality: Right;   CARPAL TUNNEL RELEASE Right 11/25/2021   Procedure: CARPAL TUNNEL RELEASE;  Surgeon: Romona Harari, MD;  Location: [facility] OR;  Service: Orthopedics;  Laterality: Right;  or MAC with regional block 60   HERNIA REPAIR     MECKEL DIVERTICULUM EXCISION     ORIF FINGER / THUMB FRACTURE     TEE WITHOUT CARDIOVERSION N/A 01/10/2022   Procedure: TRANSESOPHAGEAL ECHOCARDIOGRAM (TEE);  Surgeon: Lucas Dorise POUR, MD;  Location: [facility] OR;  Service: Open Heart Surgery;  Laterality: N/A;   TENOSYNOVECTOMY Right 11/25/2021   Procedure: FLEXOR TENOSYNOVECTOMY;  Surgeon: Romona Harari, MD;  Location: [facility] OR;  Service: Orthopedics;  Laterality: Right;  or MAC with regional block 60   THORACIC AORTIC ENDOVASCULAR STENT GRAFT N/A 01/10/2022   Procedure:  THORACIC AORTIC ENDOVASCULAR STENT GRAFT;  Surgeon: Serene Gaile ORN, MD;  Location: [facility] OR;  Service: Vascular;  Laterality: N/A;   TRANSESOPHAGEAL ECHOCARDIOGRAM (CATH LAB) N/A 04/06/2023   Procedure: TRANSESOPHAGEAL ECHOCARDIOGRAM;  Surgeon: Barbaraann Darryle Ned, MD;  Location: [facility] INVASIVE CV LAB;  Service: Cardiovascular;  Laterality: N/A;   ulna nerve surgery       reports that he quit smoking about 36 years ago. His smoking use included cigarettes. He started smoking about 59 years ago. He has a 11.5 pack-year smoking history.  He has never used smokeless tobacco. He reports that he does not currently use alcohol. He reports that he does not use drugs.  Allergies  Allergen Reactions   Levaquin  [Levofloxacin ] Other (See Comments)    Hx of aortic aneurysm, has since been repaired.     Family History  Problem Relation Age of Onset   Cancer Mother        pancreatic   COPD Father    Diabetes Sister    Diabetes Brother    Diabetes Brother     Prior to Admission medications   Medication Sig Start Date End Date Taking? Authorizing Provider  apixaban  (ELIQUIS ) 5 MG TABS tablet Take 1 tablet (5 mg total) by mouth 2 (two) times daily. 08/10/23   Ladona Heinz, MD  cephALEXin  (KEFLEX ) 250 MG capsule Take 1 capsule (250 mg total) by mouth daily. 04/23/23   Stoneking, Adine PARAS., MD  Cholecalciferol  (VITAMIN D3) 125 MCG (5000 UT) CAPS Take 5,000 Units by mouth daily with supper.    [provider]  Cranberry-Vitamin C-Probiotic (AZO CRANBERRY PO) Take 1 capsule by mouth daily with supper.    [provider]  cyanocobalamin  (VITAMIN B12) 1000 MCG tablet Take 1,000 mcg by mouth daily.    [provider]  diphenhydrAMINE  HCl, Sleep, (UNISOM  SLEEPGELS) 50 MG CAPS Take 50 mg by mouth at bedtime.    [provider]  famciclovir  (FAMVIR ) 250 MG tablet Take 1 tablet (250 mg total) by mouth 2 (two) times daily. 06/09/23   Timmy Maude SAUNDERS, MD  ferrous sulfate 325 (65 FE) MG tablet Take 325 mg by mouth daily.    [provider]  finasteride  (PROSCAR ) 5 MG tablet Take 1 tablet (5 mg total) by mouth daily. 08/10/23   Stoneking, Adine PARAS., MD  fluconazole  (DIFLUCAN ) 100 MG tablet Take 1 tablet (100 mg total) by mouth daily. 06/09/23   Timmy Maude SAUNDERS, MD  folic acid  (FOLVITE ) 1 MG tablet Take 2 tablets (2 mg total) by mouth daily. 06/09/23   Timmy Maude SAUNDERS, MD  metoprolol  succinate (TOPROL  XL) 25 MG 24 hr tablet Take 1 tablet (25 mg total) by mouth in the morning AND 2 tablets (50 mg total) at  bedtime. 06/09/23   Kennyth Chew, MD  Multiple Vitamins-Minerals (MENS MULTIVITAMIN GUMMIES) CHEW Chew 2 each by mouth daily.    [provider]  predniSONE  (DELTASONE ) 10 MG tablet Take 1 tablet (10 mg total) by mouth daily with breakfast. 07/29/23   Timmy Maude SAUNDERS, MD  tamsulosin  (FLOMAX ) 0.4 MG CAPS capsule Take 1 capsule (0.4 mg total) by mouth in the morning and at bedtime. 06/09/23   Gerldine Lauraine BROCKS, FNP    Physical Exam: Constitutional: Moderately built and nourished. Vitals:   09/07/23 1900 09/07/23 2000 09/07/23 2141 09/07/23 2216  BP: (!) 127/97 117/89 129/79 (!) 115/93  Pulse: (!) 37 76  (!) 106  Resp: 18 17 18 19   Temp:   98.7 F (37.1  C) 98.7 F (37.1 C)  TempSrc:   Oral Oral  SpO2: 90% 93% 94% 93%  Weight:   91.1 kg   Height:   5' 10.5 (1.791 m)    Eyes: Anicteric no pallor. ENMT: No discharge from the ears eyes nose or mouth. Neck: No mass felt.  No neck rigidity. Respiratory: No rhonchi or crepitations. Cardiovascular: S1-S2 heard. Abdomen: Soft nontender bowel sound present. Musculoskeletal: No edema. Skin: No rash. Neurologic: Alert awake oriented time place and person.  Moves all extremities. Psychiatric: Appears normal.  Normal affect.   Labs on Admission: I have personally reviewed following labs and imaging studies  CBC: Recent Labs  Lab 09/07/23 1829  WBC 10.5  HGB 15.5  HCT 44.8  MCV 98.2  PLT 107*   Basic Metabolic Panel: Recent Labs  Lab 09/07/23 1801  NA 140  K 4.7  CL 99  CO2 28  GLUCOSE 136*  BUN 21  CREATININE 1.19  CALCIUM  10.7*  MG 2.0   GFR: Estimated Creatinine Clearance: 57.7 mL/min (by C-G formula based on SCr of 1.19 mg/dL). Liver Function Tests: No results for input(s): AST, ALT, ALKPHOS, BILITOT, PROT, ALBUMIN  in the last 168 hours. No results for input(s): LIPASE, AMYLASE in the last 168 hours. No results for input(s): AMMONIA in the last 168 hours. Coagulation Profile: No results for  input(s): INR, PROTIME in the last 168 hours. Cardiac Enzymes: No results for input(s): CKTOTAL, CKMB, CKMBINDEX, TROPONINI in the last 168 hours. BNP (last 3 results) Recent Labs    09/07/23 1801  PROBNP 2,875.0*   HbA1C: No results for input(s): HGBA1C in the last 72 hours. CBG: No results for input(s): GLUCAP in the last 168 hours. Lipid Profile: No results for input(s): CHOL, HDL, LDLCALC, TRIG, CHOLHDL, LDLDIRECT in the last 72 hours. Thyroid  Function Tests: No results for input(s): TSH, T4TOTAL, FREET4, T3FREE, THYROIDAB in the last 72 hours. Anemia Panel: No results for input(s): VITAMINB12, FOLATE, FERRITIN, TIBC, IRON, RETICCTPCT in the last 72 hours. Urine analysis:    Component Value Date/Time   COLORURINE YELLOW 02/17/2023 1440   APPEARANCEUR CLEAR 02/17/2023 1440   APPEARANCEUR Clear 12/09/2022 1437   LABSPEC 1.020 02/17/2023 1440   PHURINE 6.0 02/17/2023 1440   GLUCOSEU NEGATIVE 02/17/2023 1440   HGBUR NEGATIVE 02/17/2023 1440   BILIRUBINUR NEGATIVE 02/17/2023 1440   BILIRUBINUR Negative 12/09/2022 1437   KETONESUR NEGATIVE 02/17/2023 1440   PROTEINUR NEGATIVE 02/17/2023 1440   UROBILINOGEN 0.2 06/11/2022 1140   UROBILINOGEN 0.2 02/23/2014 1538   NITRITE NEGATIVE 02/17/2023 1440   LEUKOCYTESUR NEGATIVE 02/17/2023 1440   Sepsis Labs: @LABRCNTIP (procalcitonin:4,lacticidven:4) )No results found for this or any previous visit (from the past 240 hours).   Radiological Exams on Admission: DG Chest Portable 1 View Result Date: 09/07/2023 CLINICAL DATA:  Mild shortness of breath and fatigue. History of AFib. EXAM: PORTABLE CHEST 1 VIEW COMPARISON:  04/03/2023. FINDINGS: The heart is enlarged and the mediastinal contour stable. An aortic endograft is noted. Lung volumes are low with atelectasis at the lung bases. No effusion or pneumothorax is seen. Sternotomy wires are present over the midline. No acute osseous  abnormality. IMPRESSION: Low lung volumes with atelectasis at the lung bases. Electronically Signed   By: Leita Birmingham M.D.   On: 09/07/2023 18:46    EKG: Independently reviewed.  A-fib rate of 114 bpm.  Assessment/Plan Principal Problem:   CHF exacerbation (HCC) Active Problems:   CLL (chronic lymphocytic leukemia) (HCC)   History of stroke involving cerebellum  S/P thoracic aortic aneurysm repair   Atrial fibrillation with rapid ventricular response (HCC)   Acute on chronic HFrEF (heart failure with reduced ejection fraction) (HCC)    Acute on chronic HFrEF likely tachycardia mediated with improved EF last EF was 50 to 55% in June 2025.  Prior to which in March 2025 it was 25 to 30%.  Patient received 1 dose of Lasix  40 mg IV.  Will closely monitor intake or metabolic panel and await further recommendations from cardiology. A-fib with RVR presently heart rate is around 110 bpm.  Takes 25 mg of Toprol -XL in the morning and 50 mg in the night.  Continue Eliquis .  Check TSH.  Will await further recommendation from cardiology.  Patient is scheduled for ablation on September 17, 2023 with Dr. Fonda Kitty. History of CLL and autoimmune hemolytic anemia presently on prednisone .  Takes famciclovir  prophylactically. BPH on tamsulosin  and finasteride . Status post thoracic aortic aneurysm repair.  Denies any chest pain. Thrombocytopenia follow CBC.  Since patient has acute CHF with A-fib with RVR will need close monitoring further workup and more than 2 midnight stay.   DVT prophylaxis: Eliquis . Code Status: Full code. Family Communication: Patient's wife at the bedside. Disposition Plan: Cardiac telemetry. Consults called: Cardiology. Admission status: Observation.

## 2023-09-07 NOTE — Plan of Care (Signed)
 Plan of Care Note for accepted transfer   Patient name: Jason Abbott. FMW:[id number] DOB: 08/13/1945  Facility requesting transfer: [facility] ED Requesting Provider: Dr. Neysa Facility course: 78 year old male with history of stage C CLL, A-fib, SVT, CHF (EF 50 to 55% per echo 06/2023), hyperlipidemia, CVA, BPH, PUD, autoimmune hemolytic anemia, thrombocytopenia, ascending aorta aneurysm presented with complaints of dyspnea on exertion and orthopnea for the past few days.  Noted to be in A-fib with rate in the 110s.  proBNP 2875.  Chest x-ray showing low lung volumes with atelectasis at the lung bases.  ED physician discussed the case with on-call cardiologist at [facility] who requested admission by medicine service and cardiology will consult.  Patient was given IV Lasix  40 mg.  Plan of care: The patient is accepted for admission to Progressive unit at [facility].  [facility] will assume care on arrival to accepting facility. Until arrival, care as per EDP. However, [facility] available 24/7 for questions and assistance.  Check [email or web address] for on-call coverage.  Nursing staff, please call [facility] Admits & Consults System-Wide number under Amion on patient's arrival so appropriate admitting provider can evaluate the pt.

## 2023-09-07 NOTE — Progress Notes (Signed)
 Patient has arrived to [facility] 3E03. Alert and oriented. Tele placed, vitals obtained. [facility] admit notified.

## 2023-09-07 NOTE — ED Provider Notes (Signed)
 [facility] 3E HF PCU Provider Note   CSN: [id number] Arrival date & time: 09/07/23  1749     Patient presents with: Palpitations   Jason Abbott. is a 78 y.o. male.   This is a 78 year old male presenting the emergency department with generalized weakness and shortness of breath.  Reports dyspnea on exertion is worsened over the past several days.  This morning felt extremely weak, checked monitor at home and was noted to be in A-fib.  Has had some orthopnea past night or 2 was well.  No chest pain or shortness of breath.  No lightheadedness.  Compliant with all of his medications.   Palpitations      Prior to Admission medications   Medication Sig Start Date End Date Taking? Authorizing Provider  apixaban  (ELIQUIS ) 5 MG TABS tablet Take 1 tablet (5 mg total) by mouth 2 (two) times daily. 08/10/23   Ladona Heinz, MD  cephALEXin  (KEFLEX ) 250 MG capsule Take 1 capsule (250 mg total) by mouth daily. 04/23/23   Stoneking, Adine PARAS., MD  Cholecalciferol  (VITAMIN D3) 125 MCG (5000 UT) CAPS Take 5,000 Units by mouth daily with supper.    [provider]  Cranberry-Vitamin C-Probiotic (AZO CRANBERRY PO) Take 1 capsule by mouth daily with supper.    [provider]  cyanocobalamin  (VITAMIN B12) 1000 MCG tablet Take 1,000 mcg by mouth daily.    [provider]  diphenhydrAMINE  HCl, Sleep, (UNISOM  SLEEPGELS) 50 MG CAPS Take 50 mg by mouth at bedtime.    [provider]  famciclovir  (FAMVIR ) 250 MG tablet Take 1 tablet (250 mg total) by mouth 2 (two) times daily. 06/09/23   Timmy Maude SAUNDERS, MD  ferrous sulfate 325 (65 FE) MG tablet Take 325 mg by mouth daily.    [provider]  finasteride  (PROSCAR ) 5 MG tablet Take 1 tablet (5 mg total) by mouth daily. 08/10/23   Stoneking, Adine PARAS., MD  fluconazole  (DIFLUCAN ) 100 MG tablet Take 1 tablet (100 mg total) by mouth daily. 06/09/23   Timmy Maude SAUNDERS, MD  folic acid  (FOLVITE ) 1 MG tablet Take 2  tablets (2 mg total) by mouth daily. 06/09/23   Timmy Maude SAUNDERS, MD  metoprolol  succinate (TOPROL  XL) 25 MG 24 hr tablet Take 1 tablet (25 mg total) by mouth in the morning AND 2 tablets (50 mg total) at bedtime. 06/09/23   Kennyth Chew, MD  Multiple Vitamins-Minerals (MENS MULTIVITAMIN GUMMIES) CHEW Chew 2 each by mouth daily.    [provider]  predniSONE  (DELTASONE ) 10 MG tablet Take 1 tablet (10 mg total) by mouth daily with breakfast. 07/29/23   Timmy Maude SAUNDERS, MD  tamsulosin  (FLOMAX ) 0.4 MG CAPS capsule Take 1 capsule (0.4 mg total) by mouth in the morning and at bedtime. 06/09/23   Gerldine Lauraine BROCKS, FNP    Allergies: Levaquin  [levofloxacin ]    Review of Systems  Cardiovascular:  Positive for palpitations.    Updated Vital Signs BP (!) 115/93 (BP Location: Left Arm)   Pulse (!) 106   Temp 98.7 F (37.1 C) (Oral)   Resp 19   Ht 5' 10.5 (1.791 m)   Wt 91.1 kg   SpO2 93%   BMI 28.41 kg/m   Physical Exam Vitals and nursing note reviewed.  HENT:     Head: Normocephalic.     Nose: Nose normal.     Mouth/Throat:     Mouth: Mucous membranes are moist.  Eyes:  Conjunctiva/sclera: Conjunctivae normal.  Cardiovascular:     Rate and Rhythm: Normal rate and regular rhythm.  Pulmonary:     Effort: Pulmonary effort is normal.     Breath sounds: Normal breath sounds.  Abdominal:     General: Abdomen is flat. There is no distension.     Tenderness: There is no abdominal tenderness. There is no guarding or rebound.  Musculoskeletal:     Right lower leg: No edema.     Left lower leg: No edema.  Skin:    General: Skin is warm.     Capillary Refill: Capillary refill takes less than 2 seconds.  Neurological:     Mental Status: He is alert and oriented to person, place, and time.  Psychiatric:        Mood and Affect: Mood normal.        Behavior: Behavior normal.     (all labs ordered are listed, but only abnormal results are displayed) Labs Reviewed  BASIC  METABOLIC PANEL WITH GFR - Abnormal; Notable for the following components:      Result Value   Glucose, Bld 136 (*)    Calcium  10.7 (*)    All other components within normal limits  PRO BRAIN NATRIURETIC PEPTIDE - Abnormal; Notable for the following components:   Pro Brain Natriuretic Peptide 2,875.0 (*)    All other components within normal limits  CBC - Abnormal; Notable for the following components:   Platelets 107 (*)    All other components within normal limits  MAGNESIUM     EKG: None  Radiology: DG Chest Portable 1 View Result Date: 09/07/2023 CLINICAL DATA:  Mild shortness of breath and fatigue. History of AFib. EXAM: PORTABLE CHEST 1 VIEW COMPARISON:  04/03/2023. FINDINGS: The heart is enlarged and the mediastinal contour stable. An aortic endograft is noted. Lung volumes are low with atelectasis at the lung bases. No effusion or pneumothorax is seen. Sternotomy wires are present over the midline. No acute osseous abnormality. IMPRESSION: Low lung volumes with atelectasis at the lung bases. Electronically Signed   By: Leita Birmingham M.D.   On: 09/07/2023 18:46     Procedures   Medications Ordered in the ED  furosemide  (LASIX ) injection 40 mg (40 mg Intravenous Given 09/07/23 1921)    Clinical Course as of 09/07/23 2313  Mon Sep 07, 2023  1819 Saw cardiology in June of this year and per their note:#.  Persistent atrial fibrillation: First episode occurred following thoracic aortic aneurysm repair.  Recent recurrence requiring hospitalization and cardioversion.  Associated with decline in LVEF. Due to this reason, we have prioritized a rhythm control strategy. #.  Secondary hypercoagulable state due to atrial fibrillation: CHA2DS2-VASc score of 7. -Discussed treatment options today for AF including antiarrhythmic drug therapy and ablation. Discussed risks, recovery and likelihood of success with each treatment strategy. Risk, benefits, and alternatives to EP study and ablation for  afib were discussed. These risks include but are not limited to stroke, bleeding, vascular damage, tamponade, perforation, damage to the esophagus, lungs, phrenic nerve and other structures, pulmonary vein stenosis, worsening renal function, coronary vasospasm and death.  Discussed potential need for repeat ablation procedures and antiarrhythmic drugs after an initial ablation. The patient understands these risk and wishes to proceed.  We will therefore proceed with catheter ablation at the next available time.  Carto, ICE, anesthesia are requested for the procedure.  Will also obtain CT PV protocol prior to the procedure to exclude LAA thrombus and further evaluate  atrial anatomy. -Continue metoprolol  XL 25 mg in the morning and 50 mg at bedtime.  -Continue Eliquis  5mg  BID.   #. SVT: 534 SVT episodes on Zio. 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  -Complete EP study at time of AF ablation. -Continue metoprolol  XL 25 mg in the morning and 50 mg at bedtime.    #.  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  LVEF in A-fib was 40 to 45% on TTE and 25% on TEE. Recovered on repeat TTE in sinus on 06/29/23 - EF 50-55%. Well compensated today  [TY]  1858 PMH per chart review:78 y.o. male with a hx of paroxysmal atrial fibrillation S/P thoracic aortic aneurysm repair, chronic RBBB, CLL, hyperlipidemia, previous CVA, BPH, peptic ulcer disease, autoimmune hemolytic anemia  [TY]  1858 Echo 6/23: 1. Left ventricular ejection fraction, by estimation, is 50 to 55%. The  left ventricle has low normal function. The left ventricle has no regional  wall motion abnormalities. There is mild concentric left ventricular  hypertrophy. Left ventricular  diastolic parameters are consistent with Grade I diastolic dysfunction  (impaired relaxation).   [TY]  68 Spoke with cardiology recommending IV Lasix  x 1 admission to the hospitalist team at [facility] [TY]    Clinical Course User Index [TY] Neysa Caron PARAS, DO                                  Medical Decision Making This is a well-appearing 78 year old male presenting emergency department with shortness of breath and generalized weakness.  Noted to be A-fib RVR, history of the same is on Eliquis  per chart review.  History concerning for possible CHF component with worsening dyspnea on exertion/orthopnea.  Chest x-ray without overt pulmonary edema, however BNP elevated 2870.  No leukocytosis to suggest infectious process.  No metabolic derangements.  Normal electrolytes.  Case discussed with cardiology; see ED course.  Given IV Lasix  based off of cardiology's recommendations.  Case discussed with Dr. Alfornia agrees to admit patient.  Amount and/or Complexity of Data Reviewed Independent Historian:     Details: Wife notes A-fib RVR at home monitoring and patient on Eliquis  External Data Reviewed:     Details: Appears to have upcoming ablation for A-fib Labs: ordered. Decision-making details documented in ED Course. Radiology: ordered and independent interpretation performed.    Details: Chest x-ray without overt pulmonary edema ECG/medicine tests: ordered and independent interpretation performed.    Details: A-fib RVR.  No STEMI  Risk Prescription drug management. Decision regarding hospitalization. Diagnosis or treatment significantly limited by social determinants of health. Risk Details: Poor health literacy       Final diagnoses:  None    ED Discharge Orders     None          Neysa Caron PARAS, DO 09/07/23 2313

## 2023-09-08 ENCOUNTER — Inpatient Hospital Stay (HOSPITAL_COMMUNITY): Admitting: Anesthesiology

## 2023-09-08 ENCOUNTER — Encounter (HOSPITAL_COMMUNITY): Payer: Self-pay | Admitting: Cardiology

## 2023-09-08 ENCOUNTER — Encounter (HOSPITAL_COMMUNITY)
Admission: EM | Disposition: A | Discharge status: Home / Self Care | Payer: Self-pay | Source: Home / Self Care | Attending: Internal Medicine

## 2023-09-08 ENCOUNTER — Encounter: Payer: Self-pay | Admitting: Sports Medicine

## 2023-09-08 ENCOUNTER — Other Ambulatory Visit: Payer: Self-pay

## 2023-09-08 DIAGNOSIS — Z8679 Personal history of other diseases of the circulatory system: Secondary | ICD-10-CM | POA: Diagnosis not present

## 2023-09-08 DIAGNOSIS — I4891 Unspecified atrial fibrillation: Secondary | ICD-10-CM

## 2023-09-08 DIAGNOSIS — Z95818 Presence of other cardiac implants and grafts: Secondary | ICD-10-CM | POA: Diagnosis not present

## 2023-09-08 DIAGNOSIS — N400 Enlarged prostate without lower urinary tract symptoms: Secondary | ICD-10-CM | POA: Diagnosis present

## 2023-09-08 DIAGNOSIS — Z881 Allergy status to other antibiotic agents status: Secondary | ICD-10-CM | POA: Diagnosis not present

## 2023-09-08 DIAGNOSIS — D696 Thrombocytopenia, unspecified: Secondary | ICD-10-CM | POA: Diagnosis present

## 2023-09-08 DIAGNOSIS — Z7901 Long term (current) use of anticoagulants: Secondary | ICD-10-CM | POA: Diagnosis not present

## 2023-09-08 DIAGNOSIS — D591 Autoimmune hemolytic anemia, unspecified: Secondary | ICD-10-CM | POA: Diagnosis present

## 2023-09-08 DIAGNOSIS — E872 Acidosis, unspecified: Secondary | ICD-10-CM | POA: Diagnosis present

## 2023-09-08 DIAGNOSIS — E782 Mixed hyperlipidemia: Secondary | ICD-10-CM

## 2023-09-08 DIAGNOSIS — Z8673 Personal history of transient ischemic attack (TIA), and cerebral infarction without residual deficits: Secondary | ICD-10-CM | POA: Diagnosis not present

## 2023-09-08 DIAGNOSIS — I110 Hypertensive heart disease with heart failure: Secondary | ICD-10-CM | POA: Diagnosis present

## 2023-09-08 DIAGNOSIS — Z8616 Personal history of COVID-19: Secondary | ICD-10-CM | POA: Diagnosis not present

## 2023-09-08 DIAGNOSIS — Z833 Family history of diabetes mellitus: Secondary | ICD-10-CM | POA: Diagnosis not present

## 2023-09-08 DIAGNOSIS — Z9889 Other specified postprocedural states: Secondary | ICD-10-CM | POA: Diagnosis not present

## 2023-09-08 DIAGNOSIS — I4819 Other persistent atrial fibrillation: Secondary | ICD-10-CM | POA: Diagnosis present

## 2023-09-08 DIAGNOSIS — Z87891 Personal history of nicotine dependence: Secondary | ICD-10-CM | POA: Diagnosis not present

## 2023-09-08 DIAGNOSIS — I5033 Acute on chronic diastolic (congestive) heart failure: Secondary | ICD-10-CM | POA: Diagnosis not present

## 2023-09-08 DIAGNOSIS — I428 Other cardiomyopathies: Secondary | ICD-10-CM | POA: Diagnosis present

## 2023-09-08 DIAGNOSIS — I2510 Atherosclerotic heart disease of native coronary artery without angina pectoris: Secondary | ICD-10-CM | POA: Diagnosis present

## 2023-09-08 DIAGNOSIS — I5043 Acute on chronic combined systolic (congestive) and diastolic (congestive) heart failure: Secondary | ICD-10-CM | POA: Diagnosis present

## 2023-09-08 DIAGNOSIS — C9110 Chronic lymphocytic leukemia of B-cell type not having achieved remission: Secondary | ICD-10-CM | POA: Diagnosis not present

## 2023-09-08 DIAGNOSIS — R002 Palpitations: Secondary | ICD-10-CM | POA: Diagnosis present

## 2023-09-08 DIAGNOSIS — Z7952 Long term (current) use of systemic steroids: Secondary | ICD-10-CM | POA: Diagnosis not present

## 2023-09-08 DIAGNOSIS — I5023 Acute on chronic systolic (congestive) heart failure: Secondary | ICD-10-CM

## 2023-09-08 DIAGNOSIS — E785 Hyperlipidemia, unspecified: Secondary | ICD-10-CM | POA: Diagnosis present

## 2023-09-08 DIAGNOSIS — I480 Paroxysmal atrial fibrillation: Secondary | ICD-10-CM | POA: Diagnosis not present

## 2023-09-08 HISTORY — PX: CARDIOVERSION: EP1203

## 2023-09-08 LAB — COMPREHENSIVE METABOLIC PANEL WITH GFR
ALT: 17 U/L (ref 0–44)
AST: 25 U/L (ref 15–41)
Albumin: 3.6 g/dL (ref 3.5–5.0)
Alkaline Phosphatase: 46 U/L (ref 38–126)
Anion gap: 12 (ref 5–15)
BUN: 22 mg/dL (ref 8–23)
CO2: 30 mmol/L (ref 22–32)
Calcium: 9.7 mg/dL (ref 8.9–10.3)
Chloride: 98 mmol/L (ref 98–111)
Creatinine, Ser: 1.19 mg/dL (ref 0.61–1.24)
GFR, Estimated: >60 mL/min (ref >60)
Glucose, Bld: 184 mg/dL — ABNORMAL HIGH (ref 70–99)
Potassium: 3.9 mmol/L (ref 3.5–5.1)
Sodium: 140 mmol/L (ref 135–145)
Total Bilirubin: 0.8 mg/dL (ref 0.0–1.2)
Total Protein: 5.8 g/dL — ABNORMAL LOW (ref 6.5–8.1)

## 2023-09-08 LAB — CBC
HCT: 45.7 % (ref 39.0–52.0)
Hemoglobin: 15.8 g/dL (ref 13.0–17.0)
MCH: 34.1 pg — ABNORMAL HIGH (ref 26.0–34.0)
MCHC: 34.6 g/dL (ref 30.0–36.0)
MCV: 98.5 fL (ref 80.0–100.0)
Platelets: 100 K/uL — ABNORMAL LOW (ref 150–400)
RBC: 4.64 MIL/uL (ref 4.22–5.81)
RDW: 13.7 % (ref 11.5–15.5)
WBC: 13.5 K/uL — ABNORMAL HIGH (ref 4.0–10.5)
nRBC: 0.2 % (ref 0.0–0.2)

## 2023-09-08 LAB — LACTIC ACID, PLASMA
Lactic Acid, Venous: 2.1 mmol/L (ref 0.5–1.9)
Lactic Acid, Venous: 2.3 mmol/L (ref 0.5–1.9)
Lactic Acid, Venous: 2.1 mmol/L (ref 0.5–1.9)

## 2023-09-08 LAB — TROPONIN I (HIGH SENSITIVITY)
Troponin I (High Sensitivity): 22 ng/L — ABNORMAL HIGH (ref <18)
Troponin I (High Sensitivity): 20 ng/L — ABNORMAL HIGH (ref <18)

## 2023-09-08 LAB — TSH: TSH: 3.083 u[IU]/mL (ref 0.350–4.500)

## 2023-09-08 MED ORDER — SODIUM CHLORIDE 0.9 % IV SOLN: INTRAVENOUS | Status: DC

## 2023-09-08 MED ORDER — PROPOFOL 10 MG/ML IV BOLUS
INTRAVENOUS | Status: DC | PRN
  Administered 2023-09-08: 20 mg via INTRAVENOUS
  Administered 2023-09-08: 60 mg via INTRAVENOUS

## 2023-09-08 MED ORDER — LIDOCAINE 2% (20 MG/ML) 5 ML SYRINGE
INTRAMUSCULAR | Status: DC | PRN
  Administered 2023-09-08: 80 mg via INTRAVENOUS

## 2023-09-08 MED ORDER — EPHEDRINE SULFATE (PRESSORS) 50 MG/ML IJ SOLN
INTRAMUSCULAR | Status: DC | PRN
Start: 2023-09-08 — End: 2023-09-08
  Administered 2023-09-08: 10 mg via INTRAVENOUS
  Administered 2023-09-08: 5 mg via INTRAVENOUS

## 2023-09-08 MED ORDER — SODIUM CHLORIDE 0.9% FLUSH
INTRAVENOUS | Status: DC | PRN
  Administered 2023-09-08: 10 mL via INTRAVENOUS

## 2023-09-08 MED ORDER — PHENYLEPHRINE HCL (PRESSORS) 10 MG/ML IV SOLN
INTRAVENOUS | Status: DC | PRN
  Administered 2023-09-08: 120 ug via INTRAVENOUS

## 2023-09-08 NOTE — Care Management Obs Status (Signed)
 MEDICARE OBSERVATION STATUS NOTIFICATION   Patient Details  Name: Jason Abbott. MRN: [id number] Date of Birth: 11-26-1945   Medicare Observation Status Notification Given:  Yes    Vonzell Arrie Sharps 09/08/2023, 9:18 AM

## 2023-09-08 NOTE — Consult Note (Addendum)
 Cardiology Consultation   Patient ID: Jason Abbott. MRN: [id number]; DOB: [birth year]  Admit date: 09/07/2023 Date of Consult: 09/08/2023  PCP:  [name], MD   [facility] Providers Cardiologist:  None  Electrophysiologist:  Fonda Kitty, MD       Patient Profile: Jason Abbott. is a 78 y.o. 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 Fairy.  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  She was first diagnosed with thoracic aortic aneurysm around 2021 as part of imaging for COVID.  Coronary CTA obtained on 09/20/2021 showed 25 to 49% proximal LAD lesion, 25 to 49% mid LAD lesion, 25 to 49% proximal D1 lesion, 50 to 69% mid D1.  First diagonal was a small caliber artery.  Ascending aorta was dilated at 41 mm at the time.  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  He eventually underwent right carotid to subclavian bypass surgery by Dr. Serene on 12/18/2021 followed by thoracic aortic aneurysm repair by Dr. Lucas on 01/10/2022.  Postop course complicated by thrombocytopenia and brief atrial fibrillation.  Given solitary nature of A-fib, he was not kept on long-term anticoagulation therapy.  He was readmitted in March 2025 with recurrent atrial fibrillation.  TTE at the time showed EF of 45 to 50%.  By the time he underwent TEE cardioversion 04/06/2023, EF was 25 to 30% with severely dilated left atrium.  Ascending aorta was measuring at the 42 mm.  He  underwent successful cardioversion and has been compliant with anticoagulation therapy since then.  Since he left the hospital, he wore a heart monitor in May 2025 that showed several episodes of SVT.  Echocardiogram was repeated on 06/29/2023 that showed EF improved to 50 to 55%, no regional wall motion abnormality, trivial MR.  He was seen by Dr. Kitty of EP service who opted for rhythm control given the drop in the ejection fraction during A-fib.  He is currently scheduled to undergo A-fib ablation on 09/17/2023.  He returned to the hospital on 09/07/2023 with 3-day onset of worsening shortness of breath and fatigue.  He denies any palpitation.  He checked his heart rate last night and noted his heart rate was in the 140s this prompted him to seek urgent medical attention at [facility].  On arrival, creatinine was 1.19, sodium 140, potassium 4.7.  proBNP was 2875.  Serial troponin 22--> 20.  Lactic acid 2.1--> 2.3.  White blood cell count 13.5.  Chest x-ray showed lower lung volumes with atelectasis at lung bases. He received a single dose of 40 mg IV Lasix  last night.  Cardiology service consulted for atrial fibrillation with RVR.  At the time of interview, he appears to be euvolemic on exam.  Talking with a family member, he has been absolutely compliant with Eliquis  prior to hospitalization.  Of note, although initial telemetry mention multiple A-fib, however on closer examination, they are artifactual.  His telemetry box connector was cleaned and no further artifact was observed.  He is currently in atrial  fibrillation with heart rate between 100-110s => he tells me that he does not feel the irregular heartbeats he just notes feeling more short of breath than he had usually been.  More easily fatigued and tired.  No chest pain no dizziness; no syncope or near syncope   Past Medical History:  Diagnosis Date   Aberrant right subclavian artery    Aortic arch aneurysm (HCC)    Arthritis     Atrial fibrillation with rapid ventricular response (HCC) 04/03/2023   Bowel obstruction (HCC)    CLL (chronic lymphocytic leukemia) (HCC)    COVID 2021   hospitalized for it   Diverticul disease small and large intestine, no perforati or abscess    GERD (gastroesophageal reflux disease)    uses tums as needed   H/O urinary infection    Pneumonia 09/22/2021   Shingles outbreak 10/03/2013   Stroke (HCC) 01/2019    Past Surgical History:  Procedure Laterality Date   AORTIC ARCH DEBRANCHING N/A 01/10/2022   Procedure: AORTIC ARCH DEBRANCHING WITH 16 X 8 X 10 MM HEMASHIELD GOLD GRAFT;  Surgeon: Lucas Dorise POUR, MD;  Location: [facility] OR;  Service: Open Heart Surgery;  Laterality: N/A;   CARDIOVERSION N/A 04/06/2023   Procedure: CARDIOVERSION;  Surgeon: Barbaraann Darryle Ned, MD;  Location: [facility] INVASIVE CV LAB;  Service: Cardiovascular;  Laterality: N/A;   CAROTID-SUBCLAVIAN BYPASS GRAFT Right 12/18/2021   Procedure: RIGHT BYPASS GRAFT CAROTID-SUBCLAVIAN;  Surgeon: Serene Gaile ORN, MD;  Location: [facility] OR;  Service: Vascular;  Laterality: Right;   CARPAL TUNNEL RELEASE Right 11/25/2021   Procedure: CARPAL TUNNEL RELEASE;  Surgeon: Romona Harari, MD;  Location: [facility] OR;  Service: Orthopedics;  Laterality: Right;  or MAC with regional block 60   HERNIA REPAIR     MECKEL DIVERTICULUM EXCISION     ORIF FINGER / THUMB FRACTURE     TEE WITHOUT CARDIOVERSION N/A 01/10/2022   Procedure: TRANSESOPHAGEAL ECHOCARDIOGRAM (TEE);  Surgeon: Lucas Dorise POUR, MD;  Location: [facility] [place] OR;  Service: Open Heart Surgery;  Laterality: N/A;   TENOSYNOVECTOMY Right 11/25/2021   Procedure: FLEXOR TENOSYNOVECTOMY;  Surgeon: Romona Harari, MD;  Location: [facility] OR;  Service: Orthopedics;  Laterality: Right;  or MAC with regional block 60   THORACIC AORTIC ENDOVASCULAR STENT GRAFT N/A 01/10/2022   Procedure: THORACIC AORTIC ENDOVASCULAR STENT GRAFT;  Surgeon: Serene Gaile ORN, MD;  Location: [facility] OR;  Service: Vascular;  Laterality:  N/A;   TRANSESOPHAGEAL ECHOCARDIOGRAM (CATH LAB) N/A 04/06/2023   Procedure: TRANSESOPHAGEAL ECHOCARDIOGRAM;  Surgeon: Barbaraann Darryle Ned, MD;  Location: [facility] INVASIVE CV LAB;  Service: Cardiovascular;  Laterality: N/A;   ulna nerve surgery       Home Medications:  Prior to Admission medications   Medication Sig Start Date Taking? Authorizing Provider  apixaban  (ELIQUIS ) 5 MG TABS tablet Take 1 tablet (5 mg total) by mouth 2 (two) times daily. 08/10/23 Yes Ladona Heinz, MD  cephALEXin  (KEFLEX ) 250 MG capsule Take 1 capsule (250 mg total) by mouth daily. Patient taking differently: Take 250 mg by mouth every evening. 04/23/23 Yes Stoneking, Adine PARAS., MD  Cholecalciferol  (VITAMIN D3) 125 MCG (5000 UT) CAPS Take 5,000 Units by mouth daily.  Yes [provider]  cyanocobalamin  (VITAMIN B12) 1000 MCG tablet Take 1,000 mcg by mouth daily.  Yes [provider]  diphenhydrAMINE  HCl, Sleep, (UNISOM  SLEEPGELS) 50 MG CAPS Take 50 mg by mouth at bedtime.  Yes [provider]  famciclovir  (FAMVIR ) 250 MG tablet Take 1 tablet (250 mg total)  by mouth 2 (two) times daily. 06/09/23 Yes Timmy Maude SAUNDERS, MD  ferrous sulfate 325 (65 FE) MG tablet Take 325 mg by mouth daily.  Yes [provider]  finasteride  (PROSCAR ) 5 MG tablet Take 1 tablet (5 mg total) by mouth daily. Patient taking differently: Take 5 mg by mouth every evening. 08/10/23 Yes Stoneking, Adine PARAS., MD  fluconazole  (DIFLUCAN ) 100 MG tablet Take 1 tablet (100 mg total) by mouth daily. Patient taking differently: Take 100 mg by mouth every evening. 06/09/23 Yes Timmy Maude SAUNDERS, MD  folic acid  (FOLVITE ) 1 MG tablet Take 2 tablets (2 mg total) by mouth daily. Patient taking differently: Take 1 mg by mouth in the morning and at bedtime. 06/09/23 Yes Timmy Maude SAUNDERS, MD  metoprolol  succinate (TOPROL  XL) 25 MG 24 hr tablet Take 1 tablet (25 mg total) by mouth in the morning AND 2 tablets (50 mg total) at bedtime. 06/09/23 Yes  Kennyth Chew, MD  Multiple Vitamins-Minerals (MENS MULTIVITAMIN GUMMIES) CHEW Chew 2 each by mouth daily.  Yes [provider]  predniSONE  (DELTASONE ) 10 MG tablet Take 1 tablet (10 mg total) by mouth daily with breakfast. 07/29/23 Yes Ennever, Maude SAUNDERS, MD  tamsulosin  (FLOMAX ) 0.4 MG CAPS capsule Take 1 capsule (0.4 mg total) by mouth in the morning and at bedtime. 06/09/23 Yes Larocco, Sarah C, FNP    Scheduled Meds:  apixaban   5 mg Oral BID   famciclovir   250 mg Oral BID   finasteride   5 mg Oral Daily   folic acid   2 mg Oral Daily   metoprolol  succinate  25 mg Oral Daily   metoprolol  succinate  50 mg Oral QHS   predniSONE   10 mg Oral Q breakfast   tamsulosin   0.4 mg Oral BID   Continuous Infusions:  PRN Meds: acetaminophen  **OR** acetaminophen   Allergies:    Allergies  Allergen Reactions   Levaquin  [Levofloxacin ] Other (See Comments)    Hx of aortic aneurysm, has since been repaired.     Social History:   Social History   Socioeconomic History   Marital status: Married    Spouse name: Randall   Number of children: 1   Years of education: 14   Highest education level: Associate degree: occupational, Scientist, product/process development, or vocational program  Occupational History    Comment: retired Electronics engineer, Solicitor,  Tobacco Use   Smoking status: Former    Current packs/day: 0.00    Average packs/day: 0.5 packs/day for 23.0 years (11.5 ttl pk-yrs)    Types: Cigarettes    Start date: 1966    Quit date: 1989    Years since quitting: 36.6   Smokeless tobacco: Never  Vaping Use   Vaping status: Never Used  Substance and Sexual Activity   Alcohol use: Not Currently    Comment: rarely   Drug use: No   Sexual activity: Not Currently  Other Topics Concern   Not on file  Social History Narrative   Lives with his wife. His son lives in [place] . He enjoys music.   Social Drivers of Corporate investment banker Strain: Low Risk  (11/04/2022)   Overall Financial Resource Strain (CARDIA)     Difficulty of Paying Living Expenses: Not hard at all  Food Insecurity: No Food Insecurity (09/07/2023)   Hunger Vital Sign    Worried About Running Out of Food in the Last Year: Never true    Ran Out of Food in the Last Year: Never true  Transportation Needs: No Transportation Needs (  09/07/2023)   PRAPARE - Administrator, Civil Service (Medical): No    Lack of Transportation (Non-Medical): No  Physical Activity: Insufficiently Active (11/04/2022)   Exercise Vital Sign    Days of Exercise per Week: 1 day    Minutes of Exercise per Session: 30 min  Stress: No Stress Concern Present (11/04/2022)   [employer] of Occupational Health - Occupational Stress Questionnaire    Feeling of Stress : Not at all  Social Connections: Moderately Isolated (09/07/2023)   Social Connection and Isolation Panel    Frequency of Communication with Friends and Family: Three times a week    Frequency of Social Gatherings with Friends and Family: Twice a week    Attends Religious Services: Never    Database administrator or Organizations: No    Attends Banker Meetings: Never    Marital Status: Married  Catering manager Violence: Not At Risk (09/07/2023)   Humiliation, Afraid, Rape, and Kick questionnaire    Fear of Current or Ex-Partner: No    Emotionally Abused: No    Physically Abused: No    Sexually Abused: No    Family History:    Family History  Problem Relation Age of Onset   Cancer Mother        pancreatic   COPD Father    Diabetes Sister    Diabetes Brother    Diabetes Brother      ROS:  Please see the history of present illness.   All other ROS reviewed and negative.     Physical Exam/Data: Vitals:   09/08/23 0104 09/08/23 0419 09/08/23 0548 09/08/23 0720  BP: 116/77 (!) 84/62 107/84   Pulse: 99 93 88   Resp: 18 18 20    Temp: 97.6 F (36.4 C) 98 F (36.7 C) 98 F (36.7 C)   TempSrc: Oral Oral Oral Oral  SpO2: 96% 94% 96%   Weight:   90.3 kg    Height:        Intake/Output Summary (Last 24 hours) at 09/08/2023 1138 Last data filed at 09/08/2023 0550 Gross per 24 hour  Intake 240 ml  Output 900 ml  Net -660 ml      09/08/2023    5:48 AM 09/07/2023    9:41 PM 09/07/2023    5:57 PM  Last 3 Weights  Weight (lbs) 199 lb 1.2 oz 200 lb 13.4 oz 200 lb  Weight (kg) 90.3 kg 91.1 kg 90.719 kg     Body mass index is 28.16 kg/m.  General:  Well nourished, well developed, in no acute distress HEENT: normal Neck: no JVD Vascular: No carotid bruits; Distal pulses 2+ bilaterally Cardiac:  normal S1, split S2; borderline tachycardic with irregularly irregular rhythm.  Soft HSM at apex Lungs:  clear to auscultation bilaterally, no wheezing, rhonchi or rales  Abd: soft, nontender, no hepatomegaly  Ext: no edema Musculoskeletal:  No deformities, BUE and BLE strength normal and equal Skin: warm and dry  Neuro:  CNs 2-12 intact, no focal abnormalities noted Psych:  Normal affect   EKG:  The EKG was personally reviewed and demonstrates:  atrial fibrillation with RVR, RBBB Telemetry:  Telemetry was personally reviewed and demonstrates:  atrial fibrillation. Multiple episodes of VFib are artifacts rather than true vfib.   Relevant CV Studies:  Echo 06/29/2023: LVEF back up to 50 and 55% with low normal function.  No RWMA.  Mild concentric LVH.  GR 1 DD.  AOV sclerosis with no stenosis.  Ascending aorta measured at 47 mm.   Laboratory Data: High Sensitivity Troponin:   Recent Labs  Lab 09/08/23 0447 09/08/23 0819  TROPONINIHS 22* 20*     Chemistry Recent Labs  Lab 09/07/23 1801 09/08/23 0317  NA 140 140  K 4.7 3.9  CL 99 98  CO2 28 30  GLUCOSE 136* 184*  BUN 21 22  CREATININE 1.19 1.19  CALCIUM  10.7* 9.7  MG 2.0  --   GFRNONAA >60 >60  ANIONGAP 13 12    Recent Labs  Lab 09/08/23 0317  PROT 5.8*  ALBUMIN  3.6  AST 25  ALT 17  ALKPHOS 46  BILITOT 0.8   Lipids No results for input(s): CHOL, TRIG, HDL, LABVLDL,  LDLCALC, CHOLHDL in the last 168 hours.  Hematology Recent Labs  Lab 09/07/23 1829 09/08/23 0317  WBC 10.5 13.5*  RBC 4.56 4.64  HGB 15.5 15.8  HCT 44.8 45.7  MCV 98.2 98.5  MCH 34.0 34.1*  MCHC 34.6 34.6  RDW 14.0 13.7  PLT 107* 100*   Thyroid   Recent Labs  Lab 09/08/23 0317  TSH 3.083    BNP Recent Labs  Lab 09/07/23 1801  PROBNP 2,875.0*    DDimer No results for input(s): DDIMER in the last 168 hours.  Radiology/Studies:  DG Chest Portable 1 View Result Date: 09/07/2023 CLINICAL DATA:  Mild shortness of breath and fatigue. History of AFib. EXAM: PORTABLE CHEST 1 VIEW COMPARISON:  04/03/2023. FINDINGS: The heart is enlarged and the mediastinal contour stable. An aortic endograft is noted. Lung volumes are low with atelectasis at the lung bases. No effusion or pneumothorax is seen. Sternotomy wires are present over the midline. No acute osseous abnormality. IMPRESSION: Low lung volumes with atelectasis at the lung bases. Electronically Signed   By: Leita Birmingham M.D.   On: 09/07/2023 18:46     Assessment and Plan: Persistent atrial fibrillation: Compliant with Eliquis .  Previously underwent TEE DCCV on 04/06/2023.  Pending upcoming A-fib ablation on 09/17/2023 by Dr. Kennyth.  He presented back to the hospital with 3-day onset of shortness of breath and fatigue.  Currently A-fib with RVR.  Plan to proceed with DCCV during this admission.   Based on the fact that he did not do well with prolonged A-fib with the most recent cardioversion having his EF reducing down in the 30s, would not want to wait longer to cardiovert now.  He has not missed any Eliquis  more therefore scheduled for cardioversion today. Will otherwise plan to continue his home medications. Will contact EP to determine if there is any adjustments to the beta-blocker that they would recommend.  Also he was told to hold beta-blocker leading up to his ablation, would ask Dr. Kennyth to comment on this as  well.  - Risk and the benefit of cardioversion including 1 in 10,000 chance of severe hypotension, severe bradycardia, aspiration event and acute respiratory failure explained to the patient who is agreeable to proceed if needed. Informed Consent   Shared Decision Making/Informed Consent The risks (stroke, cardiac arrhythmias rarely resulting in the need for a temporary or permanent pacemaker, skin irritation or burns and complications associated with conscious sedation including aspiration, arrhythmia, respiratory failure and death), benefits (restoration of normal sinus rhythm) and alternatives of a direct current cardioversion were explained in detail to Mr. Stegman and he agrees to proceed.       HFimpEF-now resolved: EF was 25 to 30% during hospitalization in March => improved post cardioversion to 50 -55% on  repeat echocardiogram in June 2025.   Based on symptoms, he has likely been in A-fib with RVR at least 3 days.   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   He appears to be euvolemic on exam  Nonobstructive CAD: Seen on coronary CTA in September 2023.  Denies any recent chest pain.  TAA s/p repair January 2024: Followed by CT surgery as outpatient.  History of CVA: No neurological deficit.  History of CLL and autoimmune hemolytic anemia: Has been compliant with Eliquis , hemoglobin normal at 15.8.  Platelet 100.  Followed by Dr. Timmy as outpatient.   Risk Assessment/Risk Scores:      New York  Heart Association (NYHA) Functional Class NYHA Class II  CHA2DS2-VASc Score = 7  This indicates a 11.2% annual risk of stroke. The patient's score is based upon: CHF History: 1 HTN History: 1 Diabetes History: 0 Stroke History: 2 Vascular Disease History: 1 Age Score: 2 Gender Score: 0      Signed, Scot Ford, [place]  09/08/2023 11:38  AM   ATTENDING ATTESTATION  I have seen, examined and evaluated the patient along with Scot Ford, PA.  After reviewing all the available data and chart, we discussed the patients laboratory, study & physical findings as well as symptoms in detail.  I agree with his findings, examination as well as impression recommendations as per our discussion.    Attending adjustments noted in italics.  Patient with upcoming PVI for A-fib on 09/17/2023 unfortunately went back into A-fib RVR over the weekend, now presenting with heart failure symptoms more so than palpitations. Based on the severity of tachycardia induced cardiomyopathy last time, we will try to expedite cardioversion and plan for today. Will ask EP to determine appropriateness of current beta-blockade and recommendations for preprocedural holding of beta-blocker.  If able to successfully cardiovert today, would like to watch at least 1 more day to ensure that he does not go right back into it.  If stable in the morning would likely discharge. If unable to cardiovert, would we need to determine the most appropriate course of action with potential antiarrhythmic agents-this would require EP consult   For questions or updates, please contact [facility] Please consult [email or web address] for contact info under    Alm MICAEL Clay, MD, MS Alm Clay, M.D., M.S. Interventional Chartered certified accountant  Pager # [phone]

## 2023-09-08 NOTE — Transfer of Care (Signed)
 Immediate Anesthesia Transfer of Care Note  Patient: Jason Abbott.  Procedure(s) Performed: CARDIOVERSION  Patient Location: Cath Lab  Anesthesia Type:MAC  Level of Consciousness: drowsy  Airway & Oxygen  Therapy: Patient Spontanous Breathing and Patient connected to nasal cannula oxygen   Post-op Assessment: Report given to RN and Post -op Vital signs reviewed and stable  Post vital signs: Reviewed and stable  Last Vitals:  Vitals Value Taken Time  BP 108/74 09/08/23 1338  Temp    Pulse 58 09/08/23 1338  Resp 16 09/08/23 1338  SpO2 96% 09/08/23 1338    Last Pain:  Vitals:   09/08/23 1302  TempSrc: Temporal  PainSc:          Complications: No notable events documented.

## 2023-09-08 NOTE — TOC CM/SW Note (Addendum)
 Transition of Care [facility]) - Inpatient Brief Assessment   Patient Details  Name: Jason Abbott. MRN: [id number] Date of Birth: 10-03-1945  Transition of Care [facility]) CM/SW Contact:    Waddell Barnie Rama, RN Phone Number: 09/08/2023, 4:10 PM   Clinical Narrative:  From home with spouse, has PCP and insurance on file, states has no HH services in place at this time or DME at home.  States family member  (wife) will transport them home at [employer] and family is support system, states gets medications from [facility] in [place] on [address] and [address].   Pta self ambulatory.   There are no ICM  needs identified  at this time.  Please place consult for ICM  needs.    Transition of Care Asessment: Insurance and Status: Insurance coverage has been reviewed Patient has primary care physician: Yes Home environment has been reviewed: home with wife Prior level of function:: indep Prior/Current Home Services: No current home services Social Drivers of Health Review: SDOH reviewed no interventions necessary Readmission risk has been reviewed: Yes Transition of care needs: no transition of care needs at this time

## 2023-09-08 NOTE — Anesthesia Preprocedure Evaluation (Addendum)
 Anesthesia Evaluation  Patient identified by MRN, date of birth, ID band Patient awake    Reviewed: Allergy & Precautions, NPO status , Patient's Chart, lab work & pertinent test results, reviewed documented beta blocker date and time   History of Anesthesia Complications Negative for: history of anesthetic complications  Airway Mallampati: II  TM Distance: >3 FB Neck ROM: Full    Dental  (+) Dental Advisory Given, Missing   Pulmonary former smoker   Pulmonary exam normal breath sounds clear to auscultation       Cardiovascular +CHF  + dysrhythmias (on Eliquis ) Atrial Fibrillation  Rhythm:Irregular Rate:Tachycardia  TTE 06/29/23:  1. Left ventricular ejection fraction, by estimation, is 50 to 55%. The  left ventricle has low normal function. The left ventricle has no regional  wall motion abnormalities. There is mild concentric left ventricular  hypertrophy. Left ventricular  diastolic parameters are consistent with Grade I diastolic dysfunction  (impaired relaxation).   2. Right ventricular systolic function was not well visualized. The right  ventricular size is not well visualized.   3. The mitral valve is normal in structure. Trivial mitral valve  regurgitation. No evidence of mitral stenosis.   4. The aortic valve is tricuspid. Aortic valve regurgitation is not  visualized. Aortic valve sclerosis is present, with no evidence of aortic  valve stenosis.   5. Aortic dilatation noted. There is moderate dilatation of the ascending  aorta, measuring 47 mm.     Neuro/Psych CVA    GI/Hepatic Neg liver ROS, PUD,GERD  Controlled,,  Endo/Other  negative endocrine ROS    Renal/GU negative Renal ROS     Musculoskeletal  (+) Arthritis ,    Abdominal   Peds  Hematology CLL   Anesthesia Other Findings Day of surgery medications reviewed with patient.  Reproductive/Obstetrics                               Anesthesia Physical Anesthesia Plan  ASA: 3  Anesthesia Plan: General   Post-op Pain Management: Minimal or no pain anticipated   Induction:   PONV Risk Score and Plan: 2 and Propofol  infusion, TIVA and Treatment may vary due to age or medical condition  Airway Management Planned:   Additional Equipment:   Intra-op Plan:   Post-operative Plan:   Informed Consent: I have reviewed the patients History and Physical, chart, labs and discussed the procedure including the risks, benefits and alternatives for the proposed anesthesia with the patient or authorized representative who has indicated his/her understanding and acceptance.     Dental advisory given  Plan Discussed with: CRNA  Anesthesia Plan Comments:          Anesthesia Quick Evaluation

## 2023-09-08 NOTE — Progress Notes (Signed)
   09/08/23 0544  Provider Notification  Provider Name/Title Dr, Franky  Date Provider Notified 09/08/23  Time Provider Notified [phone]  Method of Notification Page  Notification Reason Critical Result (lactic acid 2.1 mmol/L)  Provider response No new orders (cardio consult)  Date of Provider Response 09/08/23  Time of Provider Response [phone]

## 2023-09-08 NOTE — Progress Notes (Signed)
   09/08/23 0007  Assess: MEWS Score  Temp 98 F (36.7 C)  BP 95/78  MAP (mmHg) 84  Pulse Rate (!) 107  ECG Heart Rate (!) 109  Resp 18  Level of Consciousness Alert  SpO2 96 %  O2 Device Room Air  Patient Activity (if Appropriate) In bed  Assess: MEWS Score  MEWS Temp 0  MEWS Systolic 1  MEWS Pulse 1  MEWS RR 0  MEWS LOC 0  MEWS Score 2  MEWS Score Color Yellow  Assess: if the MEWS score is Yellow or Red  Were vital signs accurate and taken at a resting state? Yes  Does the patient meet 2 or more of the SIRS criteria? Yes  Does the patient have a confirmed or suspected source of infection? No  MEWS guidelines implemented  No, previously yellow, continue vital signs every 4 hours  Notify: Charge Nurse/RN  Name of Charge Nurse/RN Notified Matthew, RN  Provider Notification  Provider Name/Title DR. Franky  Date Provider Notified 09/08/23  Time Provider Notified 0020  Method of Notification Page (secure chat)  Notification Reason Change in status  Provider response No new orders (monitor BP closely)  Date of Provider Response 09/08/23  Time of Provider Response 0031  Assess: SIRS CRITERIA  SIRS Temperature  0  SIRS Respirations  0  SIRS Pulse 1  SIRS WBC 0  SIRS Score Sum  1

## 2023-09-08 NOTE — Anesthesia Postprocedure Evaluation (Signed)
 Anesthesia Post Note  Patient: Jason Abbott.  Procedure(s) Performed: CARDIOVERSION     Patient location during evaluation: PACU Anesthesia Type: General Level of consciousness: awake and alert Pain management: pain level controlled Vital Signs Assessment: post-procedure vital signs reviewed and stable Respiratory status: spontaneous breathing, nonlabored ventilation and respiratory function stable Cardiovascular status: blood pressure returned to baseline Postop Assessment: no apparent nausea or vomiting Anesthetic complications: no   No notable events documented.  Last Vitals:  Vitals:   09/08/23 1410 09/08/23 1428  BP: 119/80 112/62  Pulse: 71 69  Resp: 18   Temp:  36.6 C  SpO2: 92% 100%    Last Pain:  Vitals:   09/08/23 1410  TempSrc:   PainSc: 0-No pain                 Vertell Row

## 2023-09-08 NOTE — Progress Notes (Addendum)
 PROGRESS NOTE    Jason Abbott.  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 Followed by EP for A-fib scheduled for ablation in about a week -In the ER A-fib RVR, BNP 2870, hemoglobin 15, creatinine 1.1, chest x-ray with no acute findings  Subjective: -Denies dyspnea this morning, feels a little weak overall  Assessment and Plan:  Acute on chronic combined CHF -Prior echo 3/25 with EF down to 25-30%, improved to 50-55% in 6/25 -Does not appear considerably volume overloaded, blood pressure remains low as well, hold further diuretics today -GDMT limited by low BP - Await cards input  A-fib with RVR  - Home regimen of Toprol  resumed ,takes 25 mg of Toprol -XL in the morning and 50 mg in the night.  Continue Eliquis .  TSH is normal -scheduled for ablation on September 17, 2023 with Dr. Fonda Kitty.  Mildly elevated lactate - Mediated by soft BPs, A-fib RVR - Repeat lactic acid this afternoon  History of CLL and autoimmune hemolytic anemia  - Continue home regimen of prednisone .  Takes famciclovir  prophylactically.  BPH on tamsulosin  and finasteride .  Status post thoracic aortic aneurysm repair.  Denies any chest pain.  Thrombocytopenia follow CBC.  DVT prophylaxis: Eliquis  Code Status: Full code Family Communication: None present Disposition Plan: Home pending above workup  Consultants:    Procedures:   Antimicrobials:    Objective: Vitals:   09/08/23 0104 09/08/23 0419 09/08/23 0548 09/08/23 0720  BP: 116/77 (!) 84/62 107/84   Pulse: 99 93 88   Resp: 18 18 20    Temp: 97.6 F (36.4 C) 98 F (36.7 C) 98 F (36.7 C)   TempSrc: Oral Oral Oral Oral  SpO2: 96% 94% 96%   Weight:   90.3 kg   Height:         Intake/Output Summary (Last 24 hours) at 09/08/2023 0945 Last data filed at 09/08/2023 0550 Gross per 24 hour  Intake 240 ml  Output 900 ml  Net -660 ml   Filed Weights   09/07/23 1757 09/07/23 2141 09/08/23 0548  Weight: 90.7 kg 91.1 kg 90.3 kg    Examination:  General exam: Appears calm and comfortable  HEENT: No JVD Respiratory system: Clear to auscultation Cardiovascular system: S1 & S2 heard, irregular.  Abd: nondistended, soft and nontender.Normal bowel sounds heard. Central nervous system: Alert and oriented. No focal neurological deficits. Extremities: no edema Skin: No rashes Psychiatry:  Mood & affect appropriate.     Data Reviewed:   CBC: Recent Labs  Lab 09/07/23 1829 09/08/23 0317  WBC 10.5 13.5*  HGB 15.5 15.8  HCT 44.8 45.7  MCV 98.2 98.5  PLT 107* 100*   Basic Metabolic Panel: Recent Labs  Lab 09/07/23 1801 09/08/23 0317  NA 140 140  K 4.7 3.9  CL 99 98  CO2 28 30  GLUCOSE 136* 184*  BUN 21 22  CREATININE 1.19 1.19  CALCIUM  10.7* 9.7  MG 2.0  --    GFR: Estimated Creatinine Clearance: 57.4 mL/min (by C-G formula based on SCr of 1.19 mg/dL). Liver Function Tests: Recent Labs  Lab 09/08/23 0317  AST 25  ALT 17  ALKPHOS 46  BILITOT 0.8  PROT 5.8*  ALBUMIN  3.6   No results for input(s): LIPASE, AMYLASE in the last 168 hours. No  results for input(s): AMMONIA in the last 168 hours. Coagulation Profile: No results for input(s): INR, PROTIME in the last 168 hours. Cardiac Enzymes: No results for input(s): CKTOTAL, CKMB, CKMBINDEX, TROPONINI in the last 168 hours. BNP (last 3 results) Recent Labs    09/07/23 1801  PROBNP 2,875.0*   HbA1C: No results for input(s): HGBA1C in the last 72 hours. CBG: No results for input(s): GLUCAP in the last 168 hours. Lipid Profile: No results for input(s): CHOL, HDL, LDLCALC, TRIG, CHOLHDL, LDLDIRECT in the last 72 hours. Thyroid  Function Tests: Recent  Labs    09/08/23 0317  TSH 3.083   Anemia Panel: No results for input(s): VITAMINB12, FOLATE, FERRITIN, TIBC, IRON, RETICCTPCT in the last 72 hours. Urine analysis:    Component Value Date/Time   COLORURINE YELLOW 02/17/2023 1440   APPEARANCEUR CLEAR 02/17/2023 1440   APPEARANCEUR Clear 12/09/2022 1437   LABSPEC 1.020 02/17/2023 1440   PHURINE 6.0 02/17/2023 1440   GLUCOSEU NEGATIVE 02/17/2023 1440   HGBUR NEGATIVE 02/17/2023 1440   BILIRUBINUR NEGATIVE 02/17/2023 1440   BILIRUBINUR Negative 12/09/2022 1437   KETONESUR NEGATIVE 02/17/2023 1440   PROTEINUR NEGATIVE 02/17/2023 1440   UROBILINOGEN 0.2 06/11/2022 1140   UROBILINOGEN 0.2 02/23/2014 1538   NITRITE NEGATIVE 02/17/2023 1440   LEUKOCYTESUR NEGATIVE 02/17/2023 1440   Sepsis Labs: @LABRCNTIP (procalcitonin:4,lacticidven:4)  )No results found for this or any previous visit (from the past 240 hours).   Radiology Studies: DG Chest Portable 1 View Result Date: 09/07/2023 CLINICAL DATA:  Mild shortness of breath and fatigue. History of AFib. EXAM: PORTABLE CHEST 1 VIEW COMPARISON:  04/03/2023. FINDINGS: The heart is enlarged and the mediastinal contour stable. An aortic endograft is noted. Lung volumes are low with atelectasis at the lung bases. No effusion or pneumothorax is seen. Sternotomy wires are present over the midline. No acute osseous abnormality. IMPRESSION: Low lung volumes with atelectasis at the lung bases. Electronically Signed   By: Leita Birmingham M.D.   On: 09/07/2023 18:46     Scheduled Meds:  apixaban   5 mg Oral BID   famciclovir   250 mg Oral BID   finasteride   5 mg Oral Daily   folic acid   2 mg Oral Daily   metoprolol  succinate  25 mg Oral Daily   metoprolol  succinate  50 mg Oral QHS   predniSONE   10 mg Oral Q breakfast   tamsulosin   0.4 mg Oral BID   Continuous Infusions:   LOS: 0 days    Time spent:    Sigurd Pac, MD [facility]   09/08/2023, 9:45 AM

## 2023-09-08 NOTE — Plan of Care (Signed)
  Problem: Clinical Measurements: Goal: Will remain free from infection Outcome: Progressing Goal: Respiratory complications will improve Outcome: Progressing Goal: Cardiovascular complication will be avoided Outcome: Progressing   Problem: Elimination: Goal: Will not experience complications related to bowel motility Outcome: Progressing Goal: Will not experience complications related to urinary retention Outcome: Progressing   Problem: Safety: Goal: Ability to remain free from injury will improve Outcome: Progressing

## 2023-09-09 ENCOUNTER — Ambulatory Visit: Admitting: Urgent Care

## 2023-09-09 DIAGNOSIS — C9110 Chronic lymphocytic leukemia of B-cell type not having achieved remission: Secondary | ICD-10-CM | POA: Diagnosis not present

## 2023-09-09 DIAGNOSIS — Z8679 Personal history of other diseases of the circulatory system: Secondary | ICD-10-CM

## 2023-09-09 DIAGNOSIS — I5033 Acute on chronic diastolic (congestive) heart failure: Secondary | ICD-10-CM

## 2023-09-09 DIAGNOSIS — I480 Paroxysmal atrial fibrillation: Secondary | ICD-10-CM

## 2023-09-09 DIAGNOSIS — Z8673 Personal history of transient ischemic attack (TIA), and cerebral infarction without residual deficits: Secondary | ICD-10-CM

## 2023-09-09 DIAGNOSIS — N400 Enlarged prostate without lower urinary tract symptoms: Secondary | ICD-10-CM

## 2023-09-09 DIAGNOSIS — I4891 Unspecified atrial fibrillation: Secondary | ICD-10-CM | POA: Diagnosis not present

## 2023-09-09 DIAGNOSIS — Z9889 Other specified postprocedural states: Secondary | ICD-10-CM

## 2023-09-09 LAB — CBC
HCT: 43.2 % (ref 39.0–52.0)
Hemoglobin: 14.5 g/dL (ref 13.0–17.0)
MCH: 33.4 pg (ref 26.0–34.0)
MCHC: 33.6 g/dL (ref 30.0–36.0)
MCV: 99.5 fL (ref 80.0–100.0)
Platelets: 88 K/uL — ABNORMAL LOW (ref 150–400)
RBC: 4.34 MIL/uL (ref 4.22–5.81)
RDW: 13.7 % (ref 11.5–15.5)
WBC: 13.8 K/uL — ABNORMAL HIGH (ref 4.0–10.5)
nRBC: 0.1 % (ref 0.0–0.2)

## 2023-09-09 LAB — BASIC METABOLIC PANEL WITH GFR
Anion gap: 13 (ref 5–15)
BUN: 20 mg/dL (ref 8–23)
CO2: 28 mmol/L (ref 22–32)
Calcium: 9.4 mg/dL (ref 8.9–10.3)
Chloride: 102 mmol/L (ref 98–111)
Creatinine, Ser: 1.08 mg/dL (ref 0.61–1.24)
GFR, Estimated: >60 mL/min (ref >60)
Glucose, Bld: 112 mg/dL — ABNORMAL HIGH (ref 70–99)
Potassium: 3.7 mmol/L (ref 3.5–5.1)
Sodium: 143 mmol/L (ref 135–145)

## 2023-09-09 MED ORDER — FUROSEMIDE 20 MG PO TABS: 20.0000 mg | ORAL_TABLET | Freq: Every day | ORAL | Status: DC | PRN

## 2023-09-09 MED ORDER — POTASSIUM CHLORIDE CRYS ER 10 MEQ PO TBCR: 10.0000 meq | EXTENDED_RELEASE_TABLET | Freq: Every day | ORAL | Status: DC | PRN

## 2023-09-09 MED ORDER — POTASSIUM CHLORIDE CRYS ER 10 MEQ PO TBCR: 10.0000 meq | EXTENDED_RELEASE_TABLET | Freq: Every day | ORAL | 0 refills | Status: AC | PRN

## 2023-09-09 MED ORDER — FUROSEMIDE 20 MG PO TABS: 20.0000 mg | ORAL_TABLET | Freq: Every day | ORAL | 0 refills | Status: AC | PRN

## 2023-09-09 NOTE — Progress Notes (Addendum)
 Rounding Note   Patient Name: Jason Abbott. Date of Encounter: 09/09/2023  [facility] Cardiologist: None   Subjective Denies any CP or SOB. Has back pain  Scheduled Meds:  apixaban   5 mg Oral BID   famciclovir   250 mg Oral BID   finasteride   5 mg Oral Daily   folic acid   2 mg Oral Daily   metoprolol  succinate  25 mg Oral Daily   metoprolol  succinate  50 mg Oral QHS   predniSONE   10 mg Oral Q breakfast   tamsulosin   0.4 mg Oral BID   Continuous Infusions:  PRN Meds: acetaminophen  **OR** acetaminophen    Vital Signs  Vitals:   09/08/23 1949 09/08/23 2346 09/09/23 0420 09/09/23 0716  BP:    128/84  Pulse:    60  Resp: 17 18 18 20   Temp: 97.7 F (36.5 C) 98.4 F (36.9 C) 98 F (36.7 C) 97.9 F (36.6 C)  TempSrc: Oral Oral Oral Oral  SpO2:    95%  Weight:      Height:        Intake/Output Summary (Last 24 hours) at 09/09/2023 0852 Last data filed at 09/08/2023 1700 Gross per 24 hour  Intake 120 ml  Output --  Net 120 ml      09/08/2023    5:48 AM 09/07/2023    9:41 PM 09/07/2023    5:57 PM  Last 3 Weights  Weight (lbs) 199 lb 1.2 oz 200 lb 13.4 oz 200 lb  Weight (kg) 90.3 kg 91.1 kg 90.719 kg      Telemetry NSR without significant ventricular ectopy - Personally Reviewed  ECG  NSR with RBBB - Personally Reviewed  Physical Exam  GEN: No acute distress.   Neck: No JVD Cardiac: RRR, no murmurs, rubs, or gallops.  Respiratory: Clear to auscultation bilaterally. GI: Soft, nontender, non-distended  MS: No edema; No deformity. Neuro:  Nonfocal  Psych: Normal affect   Labs High Sensitivity Troponin:   Recent Labs  Lab 09/08/23 0447 09/08/23 0819  TROPONINIHS 22* 20*     Chemistry Recent Labs  Lab 09/07/23 1801 09/08/23 0317 09/09/23 0222  NA 140 140 143  K 4.7 3.9 3.7  CL 99 98 102  CO2 28 30 28   GLUCOSE 136* 184* 112*  BUN 21 22 20   CREATININE 1.19 1.19 1.08  CALCIUM  10.7* 9.7 9.4  MG 2.0  --   --   PROT  --  5.8*  --    ALBUMIN   --  3.6  --   AST  --  25  --   ALT  --  17  --   ALKPHOS  --  46  --   BILITOT  --  0.8  --   GFRNONAA >60 >60 >60  ANIONGAP 13 12 13     Lipids No results for input(s): CHOL, TRIG, HDL, LABVLDL, LDLCALC, CHOLHDL in the last 168 hours.  Hematology Recent Labs  Lab 09/07/23 1829 09/08/23 0317 09/09/23 0222  WBC 10.5 13.5* 13.8*  RBC 4.56 4.64 4.34  HGB 15.5 15.8 14.5  HCT 44.8 45.7 43.2  MCV 98.2 98.5 99.5  MCH 34.0 34.1* 33.4  MCHC 34.6 34.6 33.6  RDW 14.0 13.7 13.7  PLT 107* 100* 88*   Thyroid   Recent Labs  Lab 09/08/23 0317  TSH 3.083    BNP Recent Labs  Lab 09/07/23 1801  PROBNP 2,875.0*    DDimer No results for input(s): DDIMER in the last 168 hours.  Radiology  EP STUDY Result Date: 09/08/2023 See surgical note for result.  DG Chest Portable 1 View Result Date: 09/07/2023 CLINICAL DATA:  Mild shortness of breath and fatigue. History of AFib. EXAM: PORTABLE CHEST 1 VIEW COMPARISON:  04/03/2023. FINDINGS: The heart is enlarged and the mediastinal contour stable. An aortic endograft is noted. Lung volumes are low with atelectasis at the lung bases. No effusion or pneumothorax is seen. Sternotomy wires are present over the midline. No acute osseous abnormality. IMPRESSION: Low lung volumes with atelectasis at the lung bases. Electronically Signed   By: Leita Birmingham M.D.   On: 09/07/2023 18:46    Cardiac Studies  DCCV 09/08/2023  Patient Profile   78 y.o. 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 Fairy.   Assessment & Plan   Persistent atrial fibrillation  - previously underwent TEE DCCV on 04/06/2023. Pending upcoming A-fib ablation on 09/17/2023 by Dr. Kennyth.   - 3 day onset of SOB and fatigue. Underwent  DCCV again on 09/08/2023, holding NSR  - ok for discharge to have planned ablation on 09/17/2023.  Okay for discharge home today.  Plan will be to continue the Toprol  50 mg p.m. and 25 mg a.m.  Per previous recommendations we will hold beta-blocker prior to upcoming ablation.  HFimpEF  - EF was 25 to 30% during hospitalization in March => improved post cardioversion to 50 -55% on repeat echocardiogram in June 2025.   - proBNP 2800 on arrival, given single dose of 40mg  IV lasix , euvolemic on exam  Nonobstructive CAD: Seen on coronary CTA in September 2023. Denies any recent chest pain.   TAA s/p repair Jan 2024  H/o CVA  H/o CLL and autoimmune hemolytic anemia: Has been compliant with Eliquis , hemoglobin normal at 15.8. Platelet 100. Followed by Dr. Timmy as outpatient.     For questions or updates, please contact [facility] Please consult [email or web address] for contact info under     Signed, Scot Ford, PA  09/09/2023, 8:52 AM     ATTENDING ATTESTATION  I have seen, examined and evaluated the patient this morning on rounds along with Hao Meng,, PA.  After reviewing all the available data and chart, we discussed the patients laboratory, study & physical findings as well as symptoms in detail.  I agree with his findings, examination as well as impression recommendations as per our discussion.    Stable for discharge.  Continue current dosing of metoprolol .  Continue with plans for ablation.  Continue DOAC.    Alm MICAEL Clay, MD, MS Alm Clay, M.D., M.S. Interventional Cardiologist  [facility] Pager # [phone]     Alm Clay, MD

## 2023-09-09 NOTE — Assessment & Plan Note (Signed)
 Patient now on sinus rhythm.   Plan to continue metoprolol  succinate for rate control and anticoagulation with apixaban .  Patient has scheduled atrial fibrillation ablation on 09/17/23

## 2023-09-09 NOTE — Plan of Care (Signed)
  Problem: Education: Goal: Knowledge of General Education information will improve Description: Including pain rating scale, medication(s)/side effects and non-pharmacologic comfort measures 09/09/2023 1245 by Norine Colman BIRCH, RN Outcome: Progressing 09/09/2023 1241 by Norine Colman BIRCH, RN Outcome: Progressing   Problem: Health Behavior/Discharge Planning: Goal: Ability to manage health-related needs will improve 09/09/2023 1245 by Norine Colman BIRCH, RN Outcome: Progressing 09/09/2023 1241 by Norine Colman D, RN Outcome: Progressing   Problem: Clinical Measurements: Goal: Ability to maintain clinical measurements within normal limits will improve 09/09/2023 1245 by Norine Colman BIRCH, RN Outcome: Progressing 09/09/2023 1241 by Norine Colman D, RN Outcome: Progressing Goal: Will remain free from infection 09/09/2023 1245 by Norine Colman BIRCH, RN Outcome: Progressing 09/09/2023 1241 by Norine Colman D, RN Outcome: Progressing Goal: Diagnostic test results will improve 09/09/2023 1245 by Norine Colman BIRCH, RN Outcome: Progressing 09/09/2023 1241 by Norine Colman D, RN Outcome: Progressing Goal: Respiratory complications will improve 09/09/2023 1245 by Norine Colman BIRCH, RN Outcome: Progressing 09/09/2023 1241 by Norine Colman D, RN Outcome: Progressing Goal: Cardiovascular complication will be avoided 09/09/2023 1245 by Norine Colman BIRCH, RN Outcome: Progressing 09/09/2023 1241 by Norine Colman BIRCH, RN Outcome: Progressing   Problem: Activity: Goal: Risk for activity intolerance will decrease 09/09/2023 1245 by Norine Colman BIRCH, RN Outcome: Progressing 09/09/2023 1241 by Norine Colman D, RN Outcome: Progressing   Problem: Nutrition: Goal: Adequate nutrition will be maintained 09/09/2023 1245 by Norine Colman BIRCH, RN Outcome: Progressing 09/09/2023 1241 by Norine Colman D, RN Outcome: Progressing   Problem:  Coping: Goal: Level of anxiety will decrease 09/09/2023 1245 by Norine Colman BIRCH, RN Outcome: Progressing 09/09/2023 1241 by Norine Colman D, RN Outcome: Progressing   Problem: Elimination: Goal: Will not experience complications related to bowel motility 09/09/2023 1245 by Norine Colman BIRCH, RN Outcome: Progressing 09/09/2023 1241 by Norine Colman D, RN Outcome: Progressing Goal: Will not experience complications related to urinary retention 09/09/2023 1245 by Norine Colman BIRCH, RN Outcome: Progressing 09/09/2023 1241 by Norine Colman D, RN Outcome: Progressing   Problem: Pain Managment: Goal: General experience of comfort will improve and/or be controlled 09/09/2023 1245 by Norine Colman BIRCH, RN Outcome: Progressing 09/09/2023 1241 by Norine Colman D, RN Outcome: Progressing   Problem: Safety: Goal: Ability to remain free from injury will improve 09/09/2023 1245 by Norine Colman BIRCH, RN Outcome: Progressing 09/09/2023 1241 by Norine Colman D, RN Outcome: Progressing   Problem: Skin Integrity: Goal: Risk for impaired skin integrity will decrease 09/09/2023 1245 by Norine Colman BIRCH, RN Outcome: Progressing 09/09/2023 1241 by Norine Colman BIRCH, RN Outcome: Progressing

## 2023-09-09 NOTE — Progress Notes (Signed)
 DISCHARGE NOTE HOME Jason Abbott Jason Abbott. to be discharged Home per MD order. Discussed prescriptions and follow up appointments with the patient. Prescriptions given to patient; medication list explained in detail. Patient verbalized understanding.  Skin clean, dry and intact without evidence of skin break down, no evidence of skin tears noted. IV catheter discontinued intact. Site without signs and symptoms of complications. Dressing and pressure applied. Pt denies pain at the site currently. No complaints noted.  Patient free of lines, drains, and wounds.   An After Visit Summary (AVS) was printed and given to the patient. Patient escorted via wheelchair, and discharged home via private auto.  [name], RN

## 2023-09-09 NOTE — Plan of Care (Signed)

## 2023-09-09 NOTE — TOC Transition Note (Signed)
 Transition of Care [facility]) - Discharge Note   Patient Details  Name: Jason Abbott. MRN: [id number] Date of Birth: 1945-06-25  Transition of [facility]) CM/SW Contact:  Waddell Barnie Rama, RN Phone Number: 09/09/2023, 12:43 PM   Clinical Narrative:    For dc home today, has no needs.         Patient Goals and CMS Choice            Discharge Placement                       Discharge Plan and Services Additional resources added to the After Visit Summary for                                       Social Drivers of Health (SDOH) Interventions SDOH Screenings   Food Insecurity: No Food Insecurity (09/07/2023)  Housing: High Risk (09/07/2023)  Transportation Needs: No Transportation Needs (09/07/2023)  Utilities: Not At Risk (09/07/2023)  Alcohol Screen: Low Risk  (05/14/2022)  Depression (PHQ2-9): Low Risk  (08/31/2023)  Financial Resource Strain: Low Risk  (11/04/2022)  Physical Activity: Insufficiently Active (11/04/2022)  Social Connections: Moderately Isolated (09/07/2023)  Stress: No Stress Concern Present (11/04/2022)  Tobacco Use: Medium Risk (09/07/2023)     Readmission Risk Interventions    09/08/2023    4:10 PM 04/06/2023    3:57 PM 09/13/2021   11:06 AM  Readmission Risk Prevention Plan  Transportation Screening Complete Complete Complete  PCP or Specialist Appt within 5-7 Days Complete Complete Complete  Home Care Screening Complete Complete Complete  Medication Review (RN CM) Complete Complete Complete

## 2023-09-09 NOTE — Assessment & Plan Note (Addendum)
 Cell count has been stable  Continue prednisone   Continue iron supplementation and folic acid .  Follow up as outpatient.   Prophylactic famciclovir , cephalexin  and fluconazole .

## 2023-09-09 NOTE — Discharge Summary (Signed)
 Physician Discharge Summary   Patient: Jason Abbott. MRN: [id number] DOB: 1945-06-02  Admit date:     09/07/2023  Discharge date: 09/09/23  Discharge Physician: Elidia Sieving Kimball Manske   PCP: [name], MD   Recommendations at discharge:    Patient will continue metoprolol  succinate for rate control atrial fibrillation and will follow up on 09/17/23 with electrophysiology for atrial fibrillation ablation.  Continue anticoagulation with apixaban .  Outpatient follow up for further guideline directed medical therapy for heart failure.  Added as needed furosemide  with Kcl supplements in case of volume overload, weight gain 2 to 3 lbs in 24 hrs or 5 lbs in 7 days Follow up with Dr Curtis in 7 to 10 days Follow up with Cardiology and EP as scheduled.   Discharge Diagnoses: Principal Problem:   Acute on chronic HFrEF (heart failure with reduced ejection fraction) (HCC) Active Problems:   Atrial fibrillation with rapid ventricular response (HCC)   CLL (chronic lymphocytic leukemia) (HCC)   History of stroke involving cerebellum   S/P thoracic aortic aneurysm repair  Resolved Problems:   * No resolved hospital problems. [facility] Course: Jason Abbott was admitted to the hospital with the working diagnosis of heart failure exacerbation.   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 Followed by EP for A-fib scheduled for ablation 09/17/23. He reported 2 to 3 days of worsening dyspnea on exertion. On the day of admission he noted his HR in the 140's on his home cardiac monitor, prompting him to come to the ED.  On his initial physical examination his heart rate was 110 bpm, blood pressure 127/97, RR 37 and 02 saturation 90%  Lungs with no wheezing or rhonchi, heart  with S1 and S2 present, irregularly irregular with no gallops, abdomen with no distention and no lower extremity edema.   Na 140, K 4.7 Cl 99 bicarbonate 28 glucose 136 bun 21 cr 1,19  Mg 2.0  BNP 2875  Wbc 10,5 hgb 15.5 plt 107   Chest radiograph with hypoinflation, positive cardiomegaly, bilateral hilar vascular congestion and bilateral basal atelectasis. Sternotomy wires in place, aortic graft in place.  EKG 114 bpm, right axis deviation, right bundle branch block, qtc 470, atrial fibrillation rhythm with no significant ST segment or T wave changes.   Patient has furosemide  IV for diuresis. 09/02 successful direct current cardioversion.   Assessment and Plan: * Acute on chronic diastolic CHF (congestive heart failure) (HCC) 06/2023 Echocardiogram with preserved LV systolic function 50 to 55%, mild LVH, RV systolic function not well visualized, no significant valvular disease. LA and RA with normal size.   Patient received IV furosemide  on admission, and negative fluid balance was achieved.   Plan to continue metoprolol  succinate and will add as needed furosemide . To consider as outpatient further guideline directed medical therapy.   Lactate elevation improving. No clinical signs of hypoperfusion.   Paroxysmal atrial fibrillation (HCC) Patient now on sinus rhythm.   Plan to continue metoprolol  succinate for rate control and anticoagulation with apixaban .  Patient has scheduled atrial fibrillation ablation on 09/17/23   CLL (chronic lymphocytic leukemia) (HCC) Cell count has been stable  Continue prednisone   Continue iron supplementation and folic acid .  Follow up as outpatient.   Prophylactic famciclovir , cephalexin  and fluconazole .   History of stroke involving cerebellum  Continue anticoagulation with apixaban  for atrial fibrillation.   S/P thoracic aortic aneurysm repair Continue blood pressure control.   BPH (benign prostatic hyperplasia) No signs of urinary  retention, continue with finesteride.        Consultants: cardiology  Procedures performed: direct current cardioversion   Disposition: Home Diet recommendation:  Cardiac diet DISCHARGE MEDICATION: Allergies as of 09/09/2023       Reactions   Levaquin  [levofloxacin ] Other (See Comments)   Hx of aortic aneurysm, has since been repaired.        Medication List     TAKE these medications    apixaban  5 MG Tabs tablet Commonly known as: ELIQUIS  Take 1 tablet (5 mg total) by mouth 2 (two) times daily.   cephALEXin  250 MG capsule Commonly known as: KEFLEX  Take 1 capsule (250 mg total) by mouth daily. What changed: when to take this   cyanocobalamin  1000 MCG tablet Commonly known as: VITAMIN B12 Take 1,000 mcg by mouth daily.   famciclovir  250 MG tablet Commonly known as: FAMVIR  Take 1 tablet (250 mg total) by mouth 2 (two) times daily.   ferrous sulfate 325 (65 FE) MG tablet Take 325 mg by mouth daily.   finasteride  5 MG tablet Commonly known as: PROSCAR  Take 1 tablet (5 mg total) by mouth daily. What changed: when to take this   fluconazole  100 MG tablet Commonly known as: DIFLUCAN  Take 1 tablet (100 mg total) by mouth daily. What changed: when to take this   folic acid  1 MG tablet Commonly known as: FOLVITE  Take 2 tablets (2 mg total) by mouth daily. What changed:  how much to take when to take this   furosemide  20 MG tablet Commonly known as: LASIX  Take 1 tablet (20 mg total) by mouth daily as needed for edema or fluid (in case of weight gain 2 to 3 lbs in 24 hrs or 5 lbs in 7 days.).   Mens Multivitamin Gummies Chew Chew 2 each by mouth daily.   metoprolol  succinate 25 MG 24 hr tablet Commonly known as: Toprol  XL Take 1 tablet (25 mg total) by mouth in the morning AND 2 tablets (50 mg total) at bedtime.   potassium chloride  10 MEQ tablet Commonly known as: KLOR-CON  M Take 1 tablet (10 mEq total) by mouth daily as needed (take onnly when taking  furosemide ).   predniSONE  10 MG tablet Commonly known as: DELTASONE  Take 1 tablet (10 mg total) by mouth daily with breakfast.   tamsulosin  0.4 MG Caps capsule Commonly known as: FLOMAX  Take 1 capsule (0.4 mg total) by mouth in the morning and at bedtime.   Unisom  Sleepgels 50 MG Caps Generic drug: diphenhydrAMINE  HCl (Sleep) Take 50 mg by mouth at bedtime.   Vitamin D3 125 MCG (5000 UT) Caps Take 5,000 Units by mouth daily.        Follow-up Information     [place], Whitney L, [place]. Go in 8 day(s).   Specialty: Physician Assistant Why: PCP Follow up. Go to: 09/17/2023 @10 :20 A.M. Bring: Insurance card, ID, list of medications and copays if necessary. Contact information: [address], [address] [place] [address] [phone]                Discharge Exam: Filed Weights   09/07/23 1757 09/07/23 2141 09/08/23 0548  Weight: 90.7 kg 91.1 kg 90.3 kg   BP 117/74 (BP Location: Left Arm)   Pulse 61   Temp 97.7 F (36.5 C) (Oral)   Resp  20   Ht 5' 10.5 (1.791 m)   Wt 90.3 kg   SpO2 96%   BMI 28.16 kg/m   Patient with no chest pain or palpitations, no PND, orthopnea or edema.   Neurology awake and alert ENT with no pallor or icterus Cardiovascular with S1 and S2 present and regular with no gallops, rubs or murmurs No JVD Respiratory with no rales or wheezing, no rhonchi  Abdomen with no distention  No lower extremity edema   Condition at discharge: stable  The results of significant diagnostics from this hospitalization (including imaging, microbiology, ancillary and laboratory) are listed below for reference.   Imaging Studies: EP STUDY Result Date: 09/08/2023 See surgical note for result.  DG Chest Portable 1 View Result Date: 09/07/2023 CLINICAL DATA:  Mild shortness of breath and fatigue. History of AFib. EXAM: PORTABLE CHEST 1 VIEW COMPARISON:  04/03/2023. FINDINGS: The heart is enlarged and the mediastinal contour stable. An aortic endograft is  noted. Lung volumes are low with atelectasis at the lung bases. No effusion or pneumothorax is seen. Sternotomy wires are present over the midline. No acute osseous abnormality. IMPRESSION: Low lung volumes with atelectasis at the lung bases. Electronically Signed   By: Leita Birmingham M.D.   On: 09/07/2023 18:46   CT CARDIAC MORPH/PULM VEIN W/CM&W/O CA SCORE Addendum Date: 09/02/2023 ADDENDUM REPORT: 09/02/2023 23:16 EXAM: OVER-READ INTERPRETATION  CT CHEST The following report is an over-read performed by radiologist Dr. Fonda Mom [facility] on 09/02/2023. This over-read does not include interpretation of cardiac or coronary anatomy or pathology. The coronary CTA interpretation by the cardiologist is attached. COMPARISON:  12/02/2022. FINDINGS: Cardiovascular: See findings discussed in the body of the report. Cardiomegaly. Thoracic aortic stent. Mediastinum/Nodes: No suspicious adenopathy identified. Imaged mediastinal structures are unremarkable. Lungs/Pleura: There is dependent basilar subsegmental atelectasis. No pneumonia or pulmonary edema. No pleural effusion or pneumothorax. Upper Abdomen: No acute abnormality. Musculoskeletal: No chest multiple hepatic cysts that measure up to 5 cm abnormality. No acute osseous findings. IMPRESSION: Hepatic cysts.  No acute extracardiac incidental findings. Electronically Signed   By: Fonda Field M.D.   On: 09/02/2023 23:16   Result Date: 09/02/2023 CLINICAL DATA:  Atrial fibrillation scheduled for ablation. EXAM: Cardiac CTA TECHNIQUE: A non-contrast, gated CT scan was obtained with axial slices of 2.5 mm through the heart for calcium  scoring. Calcium  scoring was performed using the Agatston method. A 120 kV prospective, gated, contrast cardiac scan was obtained. Gantry rotation speed was 230 msec and collimation was 0.63 mm. Nitroglycerin  was not given. A delayed scan was obtained to exclude left atrial appendage thrombus. The 3D dataset was  reconstructed in systole with motion correction at 5% intervals of the 25%-50% of the R-R cycle. Images were analyzed on a dedicated workstation using MPR, MIP, and VRT modes. The patient received 95 cc of contrast. FINDINGS: There is normal variant pulmonary vein drainage into the left atrium (2 on the right and 2 on the left (the LSPV arises from the LIPV)) with ostial measurements as follows: LIPV: 21.4 mm x 17.9 mm, area 2.83 cm2 LSPV: 17.9 mm x 9.10 mm, area 1.24 cm2 RIPV: 20.4 mm x 19.7 mm, area 3.10 cm2 RSPV: 21.9 mm x 18.9 mm, area 3.31 cm2 The left atrial appendage is large windsock type with a single lobe and ostial size 20 x 26 mm and length 43 mm. There is no thrombus in the left atrial appendage, verified on PV delay images. The esophagus runs in the  left atrial midline and is not in the proximity to any of the pulmonary veins. Aorta: Previously graft repaired - moderately dilated up to 45 mm. No dissection. Aortic atherosclerosis is noted. Aortic Valve: Trileaflet.  No calcifications. Coronary Arteries: Normal coronary origin. Right dominance. The study was performed without use of NTG and insufficient for plaque evaluation. IMPRESSION: 1. There is normal variant pulmonary vein drainage into the left atrium (the LSPV arises from the LIPV). 2. The left atrial appendage is large windsock type with a single lobe and ostial size 20 x 26 mm and length 43 mm. There is no thrombus in the left atrial appendage, verified on PV delay images. 3. The esophagus runs in the left atrial midline and is not in the proximity to any of the pulmonary veins. 4. Calcium  score: Coronary calcium  score of 854. This was 68th percentile for age-, race-, and sex-matched controls (MESA). 5. Aorta: Previously graft repaired - moderately dilated up to 45 mm. No dissection. Aortic atherosclerosis is noted. Electronically Signed: By: Vinie JAYSON Maxcy M.D. On: 08/27/2023 13:41    Microbiology: Results for orders placed or performed  in visit on 02/17/23  Urine Culture     Status: None   Collection Time: 02/17/23  2:40 PM   Specimen: Urine, Clean Catch  Result Value Ref Range Status   Specimen Description   Final    URINE, CLEAN CATCH Performed at [facility] at [facility], [address], [place], [place] [address]    Special Requests   Final    URINE, CLEAN CATCH Performed at [facility] at [facility], [address], [place], [place] [address]    Culture   Final    NO GROWTH Performed at [facility], [address] [place], [place] [address]    Report Status 02/18/2023 FINAL  Final    Labs: CBC: Recent Labs  Lab 09/07/23 1829 09/08/23 0317 09/09/23 0222  WBC 10.5 13.5* 13.8*  HGB 15.5 15.8 14.5  HCT 44.8 45.7 43.2  MCV 98.2 98.5 99.5  PLT 107* 100* 88*   Basic Metabolic Panel: Recent Labs  Lab 09/07/23 1801 09/08/23 0317 09/09/23 0222  NA 140 140 143  K 4.7 3.9 3.7  CL 99 98 102  CO2 28 30 28   GLUCOSE 136* 184* 112*  BUN 21 22 20   CREATININE 1.19 1.19 1.08  CALCIUM  10.7* 9.7 9.4  MG 2.0  --   --    Liver Function Tests: Recent Labs  Lab 09/08/23 0317  AST 25  ALT 17  ALKPHOS 46  BILITOT 0.8  PROT 5.8*  ALBUMIN  3.6   CBG: No results for input(s): GLUCAP in the last 168 hours.  Discharge time spent: greater than 30 minutes.  Signed: Elidia Toribio Furnace, MD [facility] 09/09/2023

## 2023-09-09 NOTE — Progress Notes (Signed)
 DISCHARGE NOTE HOME Jason Abbott Jason Figgs. to be discharged Home per MD order. Discussed prescriptions and follow up appointments with the patient. Prescriptions given to patient; medication list explained in detail. Patient verbalized understanding.  Skin clean, dry and intact without evidence of skin break down, no evidence of skin tears noted. IV catheter discontinued intact. Site without signs and symptoms of complications. Dressing and pressure applied. Pt denies pain at the site currently. No complaints noted.  Patient free of lines, drains, and wounds.   An After Visit Summary (AVS) was printed and given to the patient. Patient escorted via wheelchair, and discharged home via private auto.  [name], RNDISCHARGE NOTE HOME Jason Abbott Jason Abbott. to be discharged Home per MD order. Discussed prescriptions and follow up appointments with the patient. Prescriptions given to patient; medication list explained in detail. Patient verbalized understanding.  Skin clean, dry and intact without evidence of skin break down, no evidence of skin tears noted. IV catheter discontinued intact. Site without signs and symptoms of complications. Dressing and pressure applied. Pt denies pain at the site currently. No complaints noted.  Patient free of lines, drains, and wounds.   An After Visit Summary (AVS) was printed and given to the patient. Patient escorted via wheelchair, and discharged home via private auto.  [name], RN

## 2023-09-09 NOTE — Progress Notes (Signed)
 Heart Failure Navigator Progress Note  Assessed for Heart & Vascular TOC clinic readiness.  Patient does not meet criteria due to EF 50-55%, Patient is scheduled for a Ablation on 09/17/2023. No HF TOC. .   Navigator will sign off at this time.   Stephane Haddock, BSN, Scientist, clinical (histocompatibility and immunogenetics) Only

## 2023-09-09 NOTE — Hospital Course (Addendum)
 Mr. Finigan was admitted to the hospital with the working diagnosis of heart failure exacerbation.   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 Followed by EP for A-fib scheduled for ablation 09/17/23. He reported 2 to 3 days of worsening dyspnea on exertion. On the day of admission he noted his HR in the 140's on his home cardiac monitor, prompting him to come to the ED.  On his initial physical examination his heart rate was 110 bpm, blood pressure 127/97, RR 37 and 02 saturation 90%  Lungs with no wheezing or rhonchi, heart with S1 and S2 present, irregularly irregular with no gallops, abdomen with no distention and no lower extremity edema.   Na 140, K 4.7 Cl 99 bicarbonate 28 glucose 136 bun 21 cr 1,19  Mg 2.0  BNP 2875  Wbc 10,5 hgb 15.5 plt 107   Chest radiograph with hypoinflation, positive cardiomegaly, bilateral hilar vascular congestion and bilateral basal atelectasis. Sternotomy wires in place, aortic graft in place.  EKG 114 bpm, right axis deviation, right bundle branch block, qtc 470, atrial fibrillation rhythm with no significant ST segment or T wave changes.   Patient has furosemide  IV for diuresis. 09/02 successful direct current cardioversion.

## 2023-09-09 NOTE — Assessment & Plan Note (Signed)
 Continue anticoagulation with apixaban  for atrial fibrillation.

## 2023-09-09 NOTE — Assessment & Plan Note (Addendum)
 06/2023 Echocardiogram with preserved LV systolic function 50 to 55%, mild LVH, RV systolic function not well visualized, no significant valvular disease. LA and RA with normal size.   Patient received IV furosemide  on admission, and negative fluid balance was achieved.   Plan to continue metoprolol  succinate and will add as needed furosemide . To consider as outpatient further guideline directed medical therapy.   Lactate elevation improving. No clinical signs of hypoperfusion.

## 2023-09-09 NOTE — Assessment & Plan Note (Signed)
 Continue blood pressure control.

## 2023-09-09 NOTE — Assessment & Plan Note (Signed)
 No signs of urinary retention, continue with finesteride.

## 2023-09-10 ENCOUNTER — Telehealth: Payer: Self-pay | Admitting: *Deleted

## 2023-09-10 NOTE — Transitions of Care (Post Inpatient/ED Visit) (Signed)
 09/10/2023  Name: Jason Abbott. MRN: [id number] DOB: [birth year]  Today's TOC FU Call Status: Today's TOC FU Call Status:: Successful TOC FU Call Completed TOC FU Call Complete Date: 09/10/23 Patient's Name and Date of Birth confirmed.  Transition Care Management Follow-up Telephone Call Date of Discharge: 09/09/23 Discharge Facility: [facility]) Type of Discharge: Inpatient Admission Primary Inpatient Discharge Diagnosis:: Acute on chronic diastolic CHF (congestive heart failure) How have you been since you were released from the hospital?: Better (eating, drinking well, independent with all aspects of care, spouse does provide some oversight for medications) Any questions or concerns?: No  Items Reviewed: Did you receive and understand the discharge instructions provided?: Yes Medications obtained,verified, and reconciled?: Yes (Medications Reviewed) Any new allergies since your discharge?: No Dietary orders reviewed?: Yes Type of Diet Ordered:: heart healthy, low sodium Do you have support at home?: Yes People in Home [RPT]: spouse Reviewed HF and A-fib action plan, importance of calling doctor early on for change in health status/ symptoms Pt declines enrollment in TOC 30 day program  Medications Reviewed Today: Medications Reviewed Today     Reviewed by Aura Mliss LABOR, RN (Registered Nurse) on 09/10/23 at 1158  Med List Status: <None>   Medication Order Taking? Sig Documenting Provider Last Dose Status Informant  apixaban  (ELIQUIS ) 5 MG TABS tablet [id number] Yes Take 1 tablet (5 mg total) by mouth 2 (two) times daily. Ladona Heinz, MD  Active Spouse/Significant Other, Pharmacy Records  cephALEXin  (KEFLEX ) 250 MG capsule [id number] Yes Take 1 capsule (250 mg total) by mouth daily. Stoneking, Adine PARAS., MD  Active Spouse/Significant Other, Pharmacy Records  Cholecalciferol  (VITAMIN D3) 125 MCG (5000 UT) CAPS [id number] Yes Take 5,000 Units by mouth daily. [provider]  Active Spouse/Significant Other, Pharmacy Records  cyanocobalamin  (VITAMIN B12) 1000 MCG tablet [id number] Yes Take 1,000 mcg by mouth daily. [provider]  Active Spouse/Significant Other, Pharmacy Records  diphenhydrAMINE  HCl, Sleep, (UNISOM  SLEEPGELS) 50 MG CAPS [id number] Yes Take 50 mg by mouth at bedtime. [provider]  Active Spouse/Significant Other, Pharmacy Records  famciclovir  (FAMVIR ) 250 MG tablet [id number] Yes Take 1 tablet (250 mg total) by mouth 2 (two) times daily. Timmy Maude SAUNDERS, MD  Active Spouse/Significant Other, Pharmacy Records  ferrous sulfate 325 (65 FE) MG tablet [id number] Yes Take 325 mg by mouth daily. [provider]  Active Spouse/Significant Other, Pharmacy Records  finasteride  (PROSCAR ) 5 MG tablet [id number] Yes Take 1 tablet (5 mg total) by mouth daily. Stoneking, Adine PARAS., MD  Active Spouse/Significant Other, Pharmacy Records  fluconazole  (DIFLUCAN ) 100 MG tablet [id number] Yes Take 1 tablet (100 mg total) by mouth daily. Timmy Maude SAUNDERS, MD  Active Spouse/Significant Other, Pharmacy Records  folic acid  (FOLVITE ) 1 MG tablet [id number] Yes Take 2 tablets (2 mg total) by mouth daily. Timmy Maude SAUNDERS, MD  Active Spouse/Significant Other, Pharmacy Records  furosemide  (LASIX ) 20 MG tablet [id number] Yes Take 1 tablet (20 mg total) by mouth daily as needed for edema or fluid (in case of weight gain 2 to 3 lbs in 24 hrs or 5 lbs in 7 days.). Arrien, Elidia Sieving, MD  Active   metoprolol  succinate (TOPROL  XL) 25 MG 24 hr tablet [id number] Yes Take 1 tablet (25 mg total) by mouth in the morning AND 2 tablets (50 mg total) at bedtime. Kennyth Chew, MD  Active Spouse/Significant Other, Pharmacy Records  Multiple Vitamins-Minerals (MENS MULTIVITAMIN EQUILLA) CHEW [id number] Yes Chew 2 each by mouth daily. [provider]  Active Spouse/Significant Other, Pharmacy Records  potassium chloride  (KLOR-CON  M) 10 MEQ tablet  [id number] Yes Take 1 tablet (10 mEq total) by mouth daily as needed (take onnly when taking furosemide ). Arrien, Mauricio Daniel, MD  Active   predniSONE  (DELTASONE ) 10 MG tablet [id number] Yes Take 1 tablet (10 mg total) by mouth daily with breakfast. Timmy Maude SAUNDERS, MD  Active Spouse/Significant Other, Pharmacy Records  tamsulosin  (FLOMAX ) 0.4 MG CAPS capsule [id number] Yes Take 1 capsule (0.4 mg total) by mouth in the morning and at bedtime. Gerldine Lauraine BROCKS, FNP  Active Spouse/Significant Other, Pharmacy Records            Home Care and Equipment/Supplies: Were Home Health Services Ordered?: No Any new equipment or medical supplies ordered?: No  Functional Questionnaire: Do you need assistance with bathing/showering or dressing?: No Do you need assistance with meal preparation?: No Do you need assistance with eating?: No Do you have difficulty maintaining continence: No Do you need assistance with getting out of bed/getting out of a chair/moving?: No Do you have difficulty managing or taking your medications?: No  Follow up appointments reviewed: PCP Follow-up appointment confirmed?: No (pt has appointment in October, declines for RN CM to schedule a sooner appointment) Specialist Hospital Follow-up appointment confirmed?: Yes Date of Specialist follow-up appointment?: 09/17/23 Follow-Up Specialty Provider:: Pt reports he is scheduled with Dr. Kennyth for ablation Do you need transportation to your follow-up appointment?: No Do you understand care options if your condition(s) worsen?: Yes-patient verbalized understanding  SDOH Interventions Today    Flowsheet Row Most Recent Value  SDOH Interventions   Food Insecurity Interventions Intervention Not Indicated  Housing Interventions Intervention Not Indicated  Transportation Interventions Intervention Not Indicated  Utilities Interventions Intervention Not Indicated    Mliss Creed [facility], BSN RN Care Manager/ Transition of  Care [place]/ [facility] [phone]

## 2023-09-11 ENCOUNTER — Other Ambulatory Visit: Payer: Self-pay | Admitting: Urology

## 2023-09-11 ENCOUNTER — Encounter: Payer: Self-pay | Admitting: Urology

## 2023-09-11 DIAGNOSIS — R339 Retention of urine, unspecified: Secondary | ICD-10-CM

## 2023-09-11 DIAGNOSIS — N401 Enlarged prostate with lower urinary tract symptoms: Secondary | ICD-10-CM

## 2023-09-11 DIAGNOSIS — N390 Urinary tract infection, site not specified: Secondary | ICD-10-CM

## 2023-09-11 MED ORDER — CEPHALEXIN 250 MG PO CAPS
250.0000 mg | ORAL_CAPSULE | Freq: Every day | ORAL | 0 refills | Status: DC
Start: 2023-09-11 — End: 2023-11-29

## 2023-09-13 ENCOUNTER — Ambulatory Visit: Payer: Self-pay | Admitting: Cardiology

## 2023-09-15 ENCOUNTER — Encounter: Payer: Self-pay | Admitting: Cardiology

## 2023-09-15 MED ORDER — ATORVASTATIN CALCIUM 20 MG PO TABS
20.0000 mg | ORAL_TABLET | Freq: Every day | ORAL | 3 refills | Status: DC
Start: 2023-09-15 — End: 2023-11-10

## 2023-09-16 ENCOUNTER — Telehealth: Payer: Self-pay | Admitting: Cardiology

## 2023-09-16 NOTE — Pre-Procedure Instructions (Signed)
 Instructed patient on the following items: Arrival time 1030 Nothing to eat or drink after midnight No meds AM of procedure Responsible person to drive you home and stay with you for 24 hrs  Have you missed any doses of anti-coagulant Eliquis-takes twice a day, hasn't missed any doses.  Don't take dose morning of procedure.

## 2023-09-16 NOTE — Telephone Encounter (Signed)
 STAT if HR is under 50 or over 120  (normal HR is 60-100 beats per minute)  What is your heart rate? 138 at 4:30  Do you have a log of your heart rate readings (document readings)? 90,100,130  Do you have any other symptoms? Sweating

## 2023-09-16 NOTE — Telephone Encounter (Signed)
 Spoke with the patient and his wife who state that patient's heart rate has increased. About 30 minutes ago it got up to 138. Patient reports feeling short of breath with minimal exertion. Patient is scheduled for an ablation tomorrow and was advised to hold his metoprolol  for 5 days prior. Advised patient with elevated heart rate he can go ahead and take 1 tablet of the metoprolol  to help bring his heart rate down. Advised patient to rest and stay hydrated. They will contact on-call provider with any other concerns this evening.

## 2023-09-17 ENCOUNTER — Ambulatory Visit (HOSPITAL_COMMUNITY): Admitting: Certified Registered Nurse Anesthetist

## 2023-09-17 ENCOUNTER — Inpatient Hospital Stay: Admitting: Urgent Care

## 2023-09-17 ENCOUNTER — Ambulatory Visit (HOSPITAL_COMMUNITY)
Admission: RE | Admit: 2023-09-17 | Discharge: 2023-09-17 | Disposition: A | Discharge status: Home / Self Care | Attending: Cardiology | Admitting: Cardiology

## 2023-09-17 ENCOUNTER — Encounter (HOSPITAL_COMMUNITY)
Admission: RE | Disposition: A | Discharge status: Home / Self Care | Payer: Self-pay | Source: Home / Self Care | Attending: Cardiology

## 2023-09-17 ENCOUNTER — Other Ambulatory Visit: Payer: Self-pay

## 2023-09-17 DIAGNOSIS — C9110 Chronic lymphocytic leukemia of B-cell type not having achieved remission: Secondary | ICD-10-CM | POA: Diagnosis not present

## 2023-09-17 DIAGNOSIS — I4819 Other persistent atrial fibrillation: Secondary | ICD-10-CM

## 2023-09-17 DIAGNOSIS — I110 Hypertensive heart disease with heart failure: Secondary | ICD-10-CM | POA: Diagnosis not present

## 2023-09-17 DIAGNOSIS — Z7901 Long term (current) use of anticoagulants: Secondary | ICD-10-CM | POA: Insufficient documentation

## 2023-09-17 DIAGNOSIS — D6869 Other thrombophilia: Secondary | ICD-10-CM | POA: Insufficient documentation

## 2023-09-17 DIAGNOSIS — N400 Enlarged prostate without lower urinary tract symptoms: Secondary | ICD-10-CM | POA: Diagnosis not present

## 2023-09-17 DIAGNOSIS — I471 Supraventricular tachycardia, unspecified: Secondary | ICD-10-CM | POA: Diagnosis not present

## 2023-09-17 DIAGNOSIS — D591 Autoimmune hemolytic anemia, unspecified: Secondary | ICD-10-CM | POA: Insufficient documentation

## 2023-09-17 DIAGNOSIS — E785 Hyperlipidemia, unspecified: Secondary | ICD-10-CM | POA: Insufficient documentation

## 2023-09-17 DIAGNOSIS — I5032 Chronic diastolic (congestive) heart failure: Secondary | ICD-10-CM | POA: Insufficient documentation

## 2023-09-17 DIAGNOSIS — Z79899 Other long term (current) drug therapy: Secondary | ICD-10-CM | POA: Insufficient documentation

## 2023-09-17 DIAGNOSIS — Z8711 Personal history of peptic ulcer disease: Secondary | ICD-10-CM | POA: Diagnosis not present

## 2023-09-17 DIAGNOSIS — I4510 Unspecified right bundle-branch block: Secondary | ICD-10-CM | POA: Insufficient documentation

## 2023-09-17 DIAGNOSIS — J45909 Unspecified asthma, uncomplicated: Secondary | ICD-10-CM | POA: Diagnosis not present

## 2023-09-17 DIAGNOSIS — I639 Cerebral infarction, unspecified: Secondary | ICD-10-CM | POA: Diagnosis not present

## 2023-09-17 DIAGNOSIS — Z8673 Personal history of transient ischemic attack (TIA), and cerebral infarction without residual deficits: Secondary | ICD-10-CM | POA: Diagnosis not present

## 2023-09-17 DIAGNOSIS — I5033 Acute on chronic diastolic (congestive) heart failure: Secondary | ICD-10-CM

## 2023-09-17 HISTORY — PX: SVT ABLATION: EP1225

## 2023-09-17 HISTORY — PX: ATRIAL FIBRILLATION ABLATION: EP1191

## 2023-09-17 LAB — POCT ACTIVATED CLOTTING TIME: Activated Clotting Time: 291 s

## 2023-09-17 MED ORDER — LIDOCAINE 2% (20 MG/ML) 5 ML SYRINGE
INTRAMUSCULAR | Status: DC | PRN
  Administered 2023-09-17: 100 mg via INTRAVENOUS

## 2023-09-17 MED ORDER — HEPARIN SODIUM (PORCINE) 1000 UNIT/ML IJ SOLN
INTRAMUSCULAR | Status: DC | PRN
  Administered 2023-09-17: 3000 [IU] via INTRAVENOUS
  Administered 2023-09-17: 16000 [IU] via INTRAVENOUS

## 2023-09-17 MED ORDER — HEPARIN SODIUM (PORCINE) 1000 UNIT/ML IJ SOLN
INTRAMUSCULAR | Status: AC
  Filled 2023-09-17: qty 10

## 2023-09-17 MED ORDER — EPHEDRINE SULFATE-NACL 50-0.9 MG/10ML-% IV SOSY
PREFILLED_SYRINGE | INTRAVENOUS | Status: DC | PRN
  Administered 2023-09-17: 5 mg via INTRAVENOUS
  Administered 2023-09-17 (×2): 10 mg via INTRAVENOUS

## 2023-09-17 MED ORDER — DEXAMETHASONE SODIUM PHOSPHATE 10 MG/ML IJ SOLN
INTRAMUSCULAR | Status: DC | PRN
  Administered 2023-09-17: 10 mg via INTRAVENOUS

## 2023-09-17 MED ORDER — ONDANSETRON HCL 4 MG/2ML IJ SOLN
INTRAMUSCULAR | Status: DC | PRN
  Administered 2023-09-17: 4 mg via INTRAVENOUS

## 2023-09-17 MED ORDER — PHENYLEPHRINE 80 MCG/ML (10ML) SYRINGE FOR IV PUSH (FOR BLOOD PRESSURE SUPPORT)
PREFILLED_SYRINGE | INTRAVENOUS | Status: DC | PRN
  Administered 2023-09-17 (×2): 160 ug via INTRAVENOUS
  Administered 2023-09-17: 120 ug via INTRAVENOUS
  Administered 2023-09-17: 80 ug via INTRAVENOUS
  Administered 2023-09-17: 120 ug via INTRAVENOUS
  Administered 2023-09-17: 160 ug via INTRAVENOUS

## 2023-09-17 MED ORDER — CEFAZOLIN SODIUM-DEXTROSE 2-4 GM/100ML-% IV SOLN
INTRAVENOUS | Status: AC
  Filled 2023-09-17: qty 100

## 2023-09-17 MED ORDER — PROPOFOL 10 MG/ML IV BOLUS
INTRAVENOUS | Status: DC | PRN
Start: 2023-09-17 — End: 2023-09-17
  Administered 2023-09-17: 120 mg via INTRAVENOUS

## 2023-09-17 MED ORDER — SODIUM CHLORIDE 0.9 % IV SOLN: 250.0000 mL | INTRAVENOUS | Status: DC | PRN

## 2023-09-17 MED ORDER — ROCURONIUM BROMIDE 10 MG/ML (PF) SYRINGE
PREFILLED_SYRINGE | INTRAVENOUS | Status: DC | PRN
  Administered 2023-09-17 (×2): 10 mg via INTRAVENOUS
  Administered 2023-09-17: 50 mg via INTRAVENOUS

## 2023-09-17 MED ORDER — FENTANYL CITRATE (PF) 100 MCG/2ML IJ SOLN
INTRAMUSCULAR | Status: DC | PRN
  Administered 2023-09-17: 100 ug via INTRAVENOUS

## 2023-09-17 MED ORDER — SUGAMMADEX SODIUM 200 MG/2ML IV SOLN
INTRAVENOUS | Status: DC | PRN
  Administered 2023-09-17: 200 mg via INTRAVENOUS

## 2023-09-17 MED ORDER — SODIUM CHLORIDE 0.9 % IV SOLN: INTRAVENOUS | Status: DC

## 2023-09-17 MED ORDER — SODIUM CHLORIDE 0.9% FLUSH: 3.0000 mL | Freq: Two times a day (BID) | INTRAVENOUS | Status: DC

## 2023-09-17 MED ORDER — HEPARIN (PORCINE) IN NACL 1000-0.9 UT/500ML-% IV SOLN
INTRAVENOUS | Status: DC | PRN
  Administered 2023-09-17 (×3): 500 mL

## 2023-09-17 MED ORDER — CEFAZOLIN SODIUM-DEXTROSE 2-4 GM/100ML-% IV SOLN
2.0000 g | Freq: Once | INTRAVENOUS | Status: AC
  Administered 2023-09-17: 2 g via INTRAVENOUS

## 2023-09-17 MED ORDER — PROTAMINE SULFATE 10 MG/ML IV SOLN
INTRAVENOUS | Status: DC | PRN
  Administered 2023-09-17: 35 mg via INTRAVENOUS

## 2023-09-17 MED ORDER — ATROPINE SULFATE 1 MG/10ML IJ SOSY
PREFILLED_SYRINGE | INTRAMUSCULAR | Status: AC
  Filled 2023-09-17: qty 10

## 2023-09-17 MED ORDER — ACETAMINOPHEN 325 MG PO TABS: 650.0000 mg | ORAL_TABLET | ORAL | Status: DC | PRN

## 2023-09-17 MED ORDER — APIXABAN 5 MG PO TABS
5.0000 mg | ORAL_TABLET | Freq: Once | ORAL | Status: AC
  Administered 2023-09-17: 5 mg via ORAL
  Filled 2023-09-17: qty 1

## 2023-09-17 MED ORDER — SODIUM CHLORIDE 0.9% FLUSH: 3.0000 mL | INTRAVENOUS | Status: DC | PRN

## 2023-09-17 MED ORDER — NITROGLYCERIN 1 MG/10 ML FOR IR/CATH LAB
INTRA_ARTERIAL | Status: AC
  Filled 2023-09-17: qty 20

## 2023-09-17 MED ORDER — ONDANSETRON HCL 4 MG/2ML IJ SOLN: 4.0000 mg | Freq: Four times a day (QID) | INTRAMUSCULAR | Status: DC | PRN

## 2023-09-17 MED ORDER — PHENYLEPHRINE HCL-NACL 20-0.9 MG/250ML-% IV SOLN
INTRAVENOUS | Status: DC | PRN
  Administered 2023-09-17: 160 ug via INTRAVENOUS
  Administered 2023-09-17: 40 ug/min via INTRAVENOUS

## 2023-09-17 MED ORDER — FENTANYL CITRATE (PF) 100 MCG/2ML IJ SOLN
INTRAMUSCULAR | Status: AC
  Filled 2023-09-17: qty 2

## 2023-09-17 MED ORDER — ATROPINE SULFATE 1 MG/10ML IJ SOSY
PREFILLED_SYRINGE | INTRAMUSCULAR | Status: DC | PRN
  Administered 2023-09-17: 1 mg via INTRAVENOUS

## 2023-09-17 NOTE — Anesthesia Preprocedure Evaluation (Signed)
 Anesthesia Evaluation  Patient identified by MRN, date of birth, ID band Patient awake    Reviewed: Allergy & Precautions, NPO status , Patient's Chart, lab work & pertinent test results, reviewed documented beta blocker date and time   History of Anesthesia Complications Negative for: history of anesthetic complications  Airway Mallampati: II  TM Distance: >3 FB Neck ROM: Full    Dental  (+) Dental Advisory Given, Missing, Partial Lower   Pulmonary neg sleep apnea, COPD, Patient abstained from smoking.Not current smoker, former smoker   Pulmonary exam normal breath sounds clear to auscultation       Cardiovascular Exercise Tolerance: Good METS(-) hypertension+CHF  (-) CAD and (-) Past MI + dysrhythmias (on Eliquis ) Atrial Fibrillation  Rhythm:Irregular Rate:Tachycardia  TTE 06/29/23:  1. Left ventricular ejection fraction, by estimation, is 50 to 55%. The  left ventricle has low normal function. The left ventricle has no regional  wall motion abnormalities. There is mild concentric left ventricular  hypertrophy. Left ventricular  diastolic parameters are consistent with Grade I diastolic dysfunction  (impaired relaxation).   2. Right ventricular systolic function was not well visualized. The right  ventricular size is not well visualized.   3. The mitral valve is normal in structure. Trivial mitral valve  regurgitation. No evidence of mitral stenosis.   4. The aortic valve is tricuspid. Aortic valve regurgitation is not  visualized. Aortic valve sclerosis is present, with no evidence of aortic  valve stenosis.   5. Aortic dilatation noted. There is moderate dilatation of the ascending  aorta, measuring 47 mm.     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) No date: Arthritis 04/03/2023: Atrial fibrillation with rapid ventricular response (HCC) No date: Bowel obstruction (HCC) No date: CLL (chronic lymphocytic leukemia) (HCC) 2021: COVID     Comment:  hospitalized for it No date: Diverticul disease small and large intestine, no perforati  or abscess No date: GERD (gastroesophageal reflux disease)     Comment:  uses tums as needed No date: H/O urinary infection 09/22/2021: Pneumonia 10/03/2013: Shingles outbreak 01/2019: Stroke (HCC)  Reproductive/Obstetrics                              Anesthesia Physical Anesthesia Plan  ASA: 3  Anesthesia Plan: General   Post-op Pain Management: Minimal or no pain anticipated   Induction: Intravenous  PONV Risk Score and Plan: 2 and Treatment may vary due to age or medical condition, Dexamethasone  and Ondansetron   Airway Management Planned: Oral ETT  Additional Equipment: None  Intra-op Plan:   Post-operative Plan: Extubation in OR  Informed Consent: I have reviewed the patients History and Physical, chart, labs and discussed the procedure including the risks, benefits and alternatives for the proposed anesthesia with the patient or authorized representative who has indicated his/her understanding and acceptance.     Dental advisory given  Plan Discussed with: CRNA  Anesthesia Plan Comments: (Discussed risks of anesthesia with patient, including PONV, sore throat, lip/dental/eye damage. Rare risks discussed as well, such as cardiorespiratory and neurological sequelae, and allergic reactions.  Discussed the role of CRNA in patient's perioperative care. Patient understands.)         Anesthesia Quick Evaluation

## 2023-09-17 NOTE — Progress Notes (Signed)
 Discharge instructions reviewed with patient and his wife Randall at the bedside. PT denies questions or concerns at this time. PT ambulated to the bathroom was able to void without difficulty. No s/s of complications at incision site after ambulation. PT was escorted from unit via wheel chair to personal vehicle.

## 2023-09-17 NOTE — Transfer of Care (Signed)
 Immediate Anesthesia Transfer of Care Note  Patient: Jason Abbott.  Procedure(s) Performed: ATRIAL FIBRILLATION ABLATION SVT ABLATION  Patient Location: Cath Lab  Anesthesia Type:General  Level of Consciousness: awake, alert , oriented, and patient cooperative  Airway & Oxygen  Therapy: Patient Spontanous Breathing and Patient connected to nasal cannula oxygen   Post-op Assessment: Report given to RN and Post -op Vital signs reviewed and stable  Post vital signs: Reviewed and stable  Last Vitals:  Vitals Value Taken Time  BP 118/74 1442  Temp    Pulse 77   Resp 19   SpO2 97%     Last Pain:  Vitals:   09/17/23 1113  TempSrc: Oral         Complications: There were no known notable events for this encounter.

## 2023-09-17 NOTE — H&P (Signed)
 Electrophysiology Office Note:   Date:  07/03/2023  ID:  Jason Abbott., DOB September 28, 1945, MRN [id number]   Primary Cardiologist: None Electrophysiologist: Fonda Kitty, MD       History of Present Illness:   Jason Abbott. is a 78 y.o. male with a hx of paroxysmal atrial fibrillation S/P thoracic aortic aneurysm repair, chronic RBBB, CLL, hyperlipidemia, previous CVA, BPH, peptic ulcer disease, autoimmune hemolytic anemia who is being seen for follow-up evaluation posthospital discharge.   He recently underwent cardioversion during a hospitalization for atrial fibrillation. Since discharge, he has experienced no complaints and his heart rhythm has remained steady, with no episodes of irregular heartbeat. He notes that his pulse feels steady, unlike before the cardioversion when it was 'skipping all over the place.'He recalls experiencing symptoms of atrial fibrillation for a few weeks prior to hospitalization. During his hospital stay, an ultrasound of the heart showed a reduced ejection fraction of about 40%, and a transesophageal echocardiogram revealed a further reduced ejection fraction of 25-30%.He is currently taking metoprolol  four times a day, every six hours, but finds this regimen impractical. He feels fatigued, which he attributes to the medication, noting that he often takes doses 'an hour too late, an hour too early.' He is also on Eliquis  for stroke prevention.    Discussed the use of AI scribe software for clinical note transcription with the patient, who gave verbal consent to proceed.   History of Present Illness He has a history of atrial fibrillation. He feels 'pretty good' overall and has not detected any irregular heart rhythms himself. Fatigue is present on some days, which has been a long-standing issue for over twenty years. Recently, he wore a heart monitor for fourteen days, which recorded 534 episodes of premature atrial contractions, although no full episodes  of atrial fibrillation were experienced. He was previously hospitalized for atrial fibrillation, during which his heart was shocked back into normal rhythm. An ultrasound at that time showed his heart was not pumping effectively when out of rhythm, but returned to normal function once rhythm was restored. He is currently taking metoprolol  to manage his heart rate and rhythm, and Eliquis  to reduce the risk of stroke. He wants to return to physical activities such as walking and jogging and inquires about the impact of his heart condition on these activities.  Interval: Patient presents today for scheduled ablation. Went into AF yesterday. Otherwise feeling okay.    Review of systems complete and found to be negative unless listed in HPI.    EP Information / Studies Reviewed:     EKG is not ordered today. EKG from 04/17/23 reviewed which showed sinus rhythm with RBBB and PVC        EKG 04/03/23: AF    Zio 05/13/23:  Patch Wear Time:  13 days and 23 hours (2025-04-11T13:26:32-0400 to 2025-04-25T13:26:24-0400)   HR 41 - 200, average 59 bpm. Frequent nonsustained SVT and 1 nonsustained VT (longest 8 beats). Sustained SVT also observed, longest episode 4 minutes and 3 seconds. No atrial fibrillation detected. Rare supraventricular ectopy. Occasional ventricular ectopy, 3.3%. There was 1 symptom trigger episode which corresponded to sinus with ectopy.   TEE 04/06/23:  1. Left ventricular ejection fraction, by estimation, is 25 to 30%. The  left ventricle has severely decreased function. The left ventricle  demonstrates global hypokinesis.   2. Right ventricular systolic function is moderately reduced. The right  ventricular size is moderately enlarged.   3. Left atrial size was  severely dilated. No left atrial/left atrial  appendage thrombus was detected. The LAA emptying velocity was 20 cm/s.   4. Right atrial size was severely dilated.   5. The mitral valve is grossly normal. Trivial mitral  valve  regurgitation. No evidence of mitral stenosis.   6. The aortic valve is tricuspid. Aortic valve regurgitation is trivial.  No aortic stenosis is present.   7. Aortic dilatation noted. There is moderate dilatation of the ascending  aorta, measuring 42 mm.   8. Evidence of atrial level shunting detected by color flow Doppler.  There is a small patent foramen ovale with predominantly left to right  shunting across the atrial septum.   9. 3D performed of the LAA and 3D performed for the EF and demonstrates  No LAA thrombus and confirmed by 3D.    Echo 04/04/23:  1. Left ventricular ejection fraction, by estimation, is 45 to 50%. The  left ventricle has mildly decreased function. The left ventricle  demonstrates global hypokinesis. Left ventricular diastolic parameters are  indeterminate.   2. Right ventricular systolic function is low normal. The right  ventricular size is normal.   3. The mitral valve is grossly normal. No evidence of mitral valve  regurgitation. No evidence of mitral stenosis.   4. The aortic valve is tricuspid. Aortic valve regurgitation is not  visualized. Aortic valve sclerosis is present, with no evidence of aortic  valve stenosis.   5. Aortic dilatation noted. Aneurysm of the ascending aorta, measuring 49  mm. There is mild dilatation of the aortic root, measuring 42 mm.   6. The inferior vena cava is dilated in size with >50% respiratory  variability, suggesting right atrial pressure of 8 mmHg.    Echo 06/29/23:   1. Left ventricular ejection fraction, by estimation, is 50 to 55%. The  left ventricle has low normal function. The left ventricle has no regional  wall motion abnormalities. There is mild concentric left ventricular  hypertrophy. Left ventricular  diastolic parameters are consistent with Grade I diastolic dysfunction  (impaired relaxation).   2. Right ventricular systolic function was not well visualized. The right  ventricular size is not  well visualized.   3. The mitral valve is normal in structure. Trivial mitral valve  regurgitation. No evidence of mitral stenosis.   4. The aortic valve is tricuspid. Aortic valve regurgitation is not  visualized. Aortic valve sclerosis is present, with no evidence of aortic  valve stenosis.   5. Aortic dilatation noted. There is moderate dilatation of the ascending  aorta, measuring 47 mm.    Risk Assessment/Calculations:     CHA2DS2-VASc Score = 7   This indicates a 11.2% annual risk of stroke. The patient's score is based upon: CHF History: 1 HTN History: 1 Diabetes History: 0 Stroke History: 2 Vascular Disease History: 1 Age Score: 2 Gender Score: 0               Physical Exam:    Today's Vitals   09/17/23 1113  BP: 132/85  Pulse: (!) 101  Resp: 18  Temp: 98.3 F (36.8 C)  TempSrc: Oral  SpO2: 98%  Weight: 90.7 kg  Height: 5' 10.5 (1.791 m)   Body mass index is 28.29 kg/m.   GEN: Well nourished, well developed in no acute distress NECK: No JVD CARDIAC: Tachycardic, irregular rhythm. RESPIRATORY:  Clear to auscultation without rales, wheezing or rhonchi  ABDOMEN: Soft, non-distended EXTREMITIES:  No edema; No deformity    ASSESSMENT AND PLAN:     #.  Persistent atrial fibrillation: First episode occurred following thoracic aortic aneurysm repair.  Recent recurrence requiring hospitalization and cardioversion.  Associated with decline in LVEF. Due to this reason, we have prioritized a rhythm control strategy. #.  Secondary hypercoagulable state due to atrial fibrillation: CHA2DS2-VASc score of 7. -Discussed treatment options today for AF including antiarrhythmic drug therapy and ablation. Discussed risks, recovery and likelihood of success with each treatment strategy. Risk, benefits, and alternatives to EP study and ablation for afib were discussed. These risks include but are not limited to stroke, bleeding, vascular damage, tamponade, perforation, damage  to the esophagus, lungs, phrenic nerve and other structures, pulmonary vein stenosis, worsening renal function, coronary vasospasm and death.  Discussed potential need for repeat ablation procedures and antiarrhythmic drugs after an initial ablation. The patient understands these risk and wishes to proceed.  We will therefore proceed with catheter ablation today. -Continue metoprolol  XL 25 mg in the morning and 50 mg at bedtime.  -Continue Eliquis  5mg  BID.   #. SVT: 534 SVT episodes on Zio. 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  -Complete EP study at time of AF ablation. -Continue metoprolol  XL 25 mg in the morning and 50 mg at bedtime.    #.  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  LVEF in A-fib was 40 to 45% on TTE and 25% on TEE. Recovered on repeat TTE in sinus on 06/29/23 - EF 50-55%.    Follow up with Dr. Kennyth 3 months after ablation.    Signed, Fonda Kennyth, MD

## 2023-09-17 NOTE — Progress Notes (Incomplete)
  Pt arrived from EP via Bed to HA 20. Report received from RN and CRNA. Pt vitals are stable, performed q49min see vitals flowsheet. Anesthesia recovery has been uneventful. Pt to transition to short stay for remainder of recovery. Will continue to monitor patient while under holding area care.

## 2023-09-17 NOTE — Anesthesia Procedure Notes (Signed)
 Procedure Name: Intubation Date/Time: 09/17/2023 12:31 PM  Performed by: Cindie Donald CROME, CRNAPre-anesthesia Checklist: Patient identified, Emergency Drugs available, Suction available and Patient being monitored Patient Re-evaluated:Patient Re-evaluated prior to induction Oxygen  Delivery Method: Circle System Utilized Preoxygenation: Pre-oxygenation with 100% oxygen  Induction Type: IV induction Ventilation: Mask ventilation without difficulty and Two handed mask ventilation required Laryngoscope Size: Mac and 4 Grade View: Grade I Tube type: Oral Tube size: 7.5 mm Number of attempts: 1 Airway Equipment and Method: Stylet Placement Confirmation: ETT inserted through vocal cords under direct vision, positive ETCO2 and breath sounds checked- equal and bilateral Secured at: 23 cm Tube secured with: Tape Dental Injury: Teeth and Oropharynx as per pre-operative assessment

## 2023-09-18 ENCOUNTER — Telehealth (HOSPITAL_COMMUNITY): Payer: Self-pay

## 2023-09-18 ENCOUNTER — Encounter (HOSPITAL_COMMUNITY): Payer: Self-pay | Admitting: Cardiology

## 2023-09-18 NOTE — Telephone Encounter (Signed)
 Spoke with patient to complete post procedure follow up call.  Patient reports no complications with groin sites.   Instructions reviewed with patient:  Remove large bandage at puncture site after 24 hours. It is normal to have bruising, tenderness, mild swelling, and a pea or marble sized lump/knot at the groin site which can take up to three months to resolve.  Get help right away if you notice sudden swelling at the puncture site.  Check your puncture site every day for signs of infection: fever, redness, swelling, pus drainage, warmth, foul odor or excessive pain. If this occurs, please call [phone], to speak with the RN Navigator. Get help right away if your puncture site is bleeding and the bleeding does not stop after applying firm pressure to the area.  You may continue to have skipped beats/ atrial fibrillation during the first several months after your procedure.  It is very important not to miss any doses of your blood thinner Eliquis .    You will follow up with the Afib clinic on 10/15/23 and follow up with the APP on 12/18/23.   Patient verbalized understanding to all instructions provided.

## 2023-09-18 NOTE — Anesthesia Postprocedure Evaluation (Signed)
 Anesthesia Post Note  Patient: Jason Abbott.  Procedure(s) Performed: ATRIAL FIBRILLATION ABLATION SVT ABLATION     Patient location during evaluation: PACU Anesthesia Type: General Level of consciousness: awake and alert Pain management: pain level controlled Vital Signs Assessment: post-procedure vital signs reviewed and stable Respiratory status: spontaneous breathing, nonlabored ventilation, respiratory function stable and patient connected to nasal cannula oxygen  Cardiovascular status: blood pressure returned to baseline and stable Postop Assessment: no apparent nausea or vomiting Anesthetic complications: no   There were no known notable events for this encounter.  Last Vitals:  Vitals:   09/17/23 1520 09/17/23 1540  BP: 107/69 110/71  Pulse: 75 72  Resp: 15 (!) 21  Temp:    SpO2: 95% 90%    Last Pain:  Vitals:   09/17/23 1520  TempSrc:   PainSc: 0-No pain   Pain Goal:                   Rome Ade

## 2023-09-23 ENCOUNTER — Other Ambulatory Visit: Payer: Self-pay | Admitting: Hematology & Oncology

## 2023-09-23 DIAGNOSIS — D591 Autoimmune hemolytic anemia, unspecified: Secondary | ICD-10-CM

## 2023-09-23 DIAGNOSIS — D696 Thrombocytopenia, unspecified: Secondary | ICD-10-CM

## 2023-09-23 DIAGNOSIS — C9110 Chronic lymphocytic leukemia of B-cell type not having achieved remission: Secondary | ICD-10-CM

## 2023-09-23 DIAGNOSIS — D509 Iron deficiency anemia, unspecified: Secondary | ICD-10-CM

## 2023-10-03 ENCOUNTER — Telehealth: Admitting: Family Medicine

## 2023-10-03 ENCOUNTER — Encounter

## 2023-10-03 DIAGNOSIS — R339 Retention of urine, unspecified: Secondary | ICD-10-CM

## 2023-10-03 DIAGNOSIS — N401 Enlarged prostate with lower urinary tract symptoms: Secondary | ICD-10-CM | POA: Diagnosis not present

## 2023-10-03 DIAGNOSIS — R3914 Feeling of incomplete bladder emptying: Secondary | ICD-10-CM

## 2023-10-03 MED ORDER — TAMSULOSIN HCL 0.4 MG PO CAPS: 0.4000 mg | ORAL_CAPSULE | Freq: Two times a day (BID) | ORAL | 0 refills | Status: DC

## 2023-10-03 NOTE — Progress Notes (Signed)
 E-Visits are not used to request refills.  After reviewing your records, I can verify that you may be running out of a long term medication before your next scheduled appointment.  Based on this information, I can refill your Tamsulosin  0.4mg  capsule on a one time basis.    Please contact your doctor as soon as possible to manage your prescription.  I have spent 5 minutes in review of e-visit questionnaire, review and updating patient chart, medical decision making and response to patient.   Roosvelt Mater, PA-C

## 2023-10-11 ENCOUNTER — Encounter

## 2023-10-14 ENCOUNTER — Ambulatory Visit: Admitting: Urgent Care

## 2023-10-15 ENCOUNTER — Encounter (HOSPITAL_COMMUNITY): Payer: Self-pay | Admitting: Internal Medicine

## 2023-10-15 ENCOUNTER — Ambulatory Visit (HOSPITAL_COMMUNITY)
Admission: RE | Admit: 2023-10-15 | Discharge: 2023-10-15 | Disposition: A | Discharge status: Home / Self Care | Source: Ambulatory Visit | Attending: Internal Medicine | Admitting: Internal Medicine

## 2023-10-15 VITALS — BP 150/92 | HR 58 | Ht 70.5 in | Wt 207.4 lb

## 2023-10-15 DIAGNOSIS — I4819 Other persistent atrial fibrillation: Secondary | ICD-10-CM | POA: Diagnosis not present

## 2023-10-15 DIAGNOSIS — D6869 Other thrombophilia: Secondary | ICD-10-CM | POA: Diagnosis not present

## 2023-10-15 DIAGNOSIS — I480 Paroxysmal atrial fibrillation: Secondary | ICD-10-CM | POA: Insufficient documentation

## 2023-10-15 NOTE — Progress Notes (Signed)
 Primary Care Physician: [name], MD Primary Cardiologist: None Electrophysiologist: Fonda Kitty, MD     Referring Physician: Dr. Kitty Ryder Jhamal Plucinski. is a 78 y.o. male with a history of TAA s/p repair, nonobstructive CAD, RBBB, CLL, autoimmune hemolytic anemia, HLD, remote CVA, BPH, and persistent atrial fibrillation who presents for consultation in the [facility]. Patient is on Eliquis  5 mg BID for stroke prevention.  On evaluation today, patient is currently in NSR. S/p Afib/SVT ablation on 09/17/23 by Dr. Kitty. No episodes of Afib since ablation confirmed via Kardiamobile device. No chest pain or SOB. Leg sites healed without issue. No missed doses of anticoagulant.  Today, he denies symptoms of orthopnea, PND, lower extremity edema, dizziness, presyncope, syncope, snoring, daytime somnolence, bleeding, or neurologic sequela. The patient is tolerating medications without difficulties and is otherwise without complaint today.    he has a BMI of Body mass index is 29.34 kg/m.SABRA Filed Weights   10/15/23 1514  Weight: 94.1 kg    Current Outpatient Medications  Medication Sig Dispense Refill   apixaban  (ELIQUIS ) 5 MG TABS tablet Take 1 tablet (5 mg total) by mouth 2 (two) times daily. 180 tablet 1   atorvastatin  (LIPITOR) 20 MG tablet Take 1 tablet (20 mg total) by mouth daily. 90 tablet 3   cephALEXin  (KEFLEX ) 250 MG capsule Take 1 capsule (250 mg total) by mouth daily. 90 capsule 0   Cholecalciferol  (VITAMIN D3) 125 MCG (5000 UT) CAPS Take 5,000 Units by mouth daily.     cyanocobalamin  (VITAMIN B12) 1000 MCG tablet Take 1,000 mcg by mouth daily.     diphenhydrAMINE  HCl, Sleep, (UNISOM  SLEEPGELS) 50 MG CAPS Take 50 mg by mouth at bedtime.     famciclovir  (FAMVIR ) 250 MG tablet Take 1 tablet (250 mg total) by mouth 2 (two) times daily. 180 tablet 1   ferrous sulfate 325 (65 FE) MG tablet Take 325 mg by mouth daily.      finasteride  (PROSCAR ) 5 MG tablet Take 1 tablet (5 mg total) by mouth daily. 90 tablet 0   fluconazole  (DIFLUCAN ) 100 MG tablet Take 1 tablet (100 mg total) by mouth daily. 90 tablet 1   folic acid  (FOLVITE ) 1 MG tablet Take 2 tablets (2 mg total) by mouth daily. 180 tablet 1   furosemide  (LASIX ) 20 MG tablet Take 1 tablet (20 mg total) by mouth daily as needed for edema or fluid (in case of weight gain 2 to 3 lbs in 24 hrs or 5 lbs in 7 days.). 30 tablet 0   metoprolol  succinate (TOPROL  XL) 25 MG 24 hr tablet Take 1 tablet (25 mg total) by mouth in the morning AND 2 tablets (50 mg total) at bedtime. 270 tablet 3   Multiple Vitamins-Minerals (MENS MULTIVITAMIN GUMMIES) CHEW Chew 2 each by mouth daily.     potassium chloride  (KLOR-CON  M) 10 MEQ tablet Take 1 tablet (10 mEq total) by mouth daily as needed (take onnly when taking furosemide ). 30 tablet 0   predniSONE  (DELTASONE ) 10 MG tablet TAKE 1 TABLET (10 MG TOTAL) BY MOUTH DAILY WITH BREAKFAST. 30 tablet 1   tamsulosin  (FLOMAX ) 0.4 MG CAPS capsule Take 1 capsule (0.4 mg total) by mouth in the morning and at bedtime for 15 days. 30 capsule 0   No current facility-administered medications for this encounter.    Atrial Fibrillation Management history:  Previous antiarrhythmic drugs: none Previous cardioversions: 09/08/23 Previous ablations: 09/17/23 Anticoagulation  history: Eliquis    ROS- All systems are reviewed and negative except as per the HPI above.  Physical Exam: BP (!) 150/92   Pulse (!) 58   Ht 5' 10.5 (1.791 m)   Wt 94.1 kg   BMI 29.34 kg/m   GEN: Well nourished, well developed in no acute distress NECK: No JVD; No carotid bruits CARDIAC: Regular rate and rhythm, no murmurs, rubs, gallops RESPIRATORY:  Clear to auscultation without rales, wheezing or rhonchi  ABDOMEN: Soft, non-tender, non-distended EXTREMITIES:  No edema; No deformity   EKG today demonstrates  Vent. rate 58 BPM PR interval 190 ms QRS duration 144  ms QT/QTcB 448/439 ms P-R-T axes 23 -47 37 Sinus bradycardia Left axis deviation Right bundle branch block Inferior infarct (cited on or before 07-Sep-2023) Abnormal ECG When compared with ECG of 17-Sep-2023 14:38, Previous ECG is present  Echo 06/29/23 demonstrated  1. Left ventricular ejection fraction, by estimation, is 50 to 55%. The  left ventricle has low normal function. The left ventricle has no regional  wall motion abnormalities. There is mild concentric left ventricular  hypertrophy. Left ventricular  diastolic parameters are consistent with Grade I diastolic dysfunction  (impaired relaxation).   2. Right ventricular systolic function was not well visualized. The right  ventricular size is not well visualized.   3. The mitral valve is normal in structure. Trivial mitral valve  regurgitation. No evidence of mitral stenosis.   4. The aortic valve is tricuspid. Aortic valve regurgitation is not  visualized. Aortic valve sclerosis is present, with no evidence of aortic  valve stenosis.   5. Aortic dilatation noted. There is moderate dilatation of the ascending  aorta, measuring 47 mm.   ASSESSMENT & PLAN CHA2DS2-VASc Score = 7  The patient's score is based upon: CHF History: 1 HTN History: 1 Diabetes History: 0 Stroke History: 2 Vascular Disease History: 1 Age Score: 2 Gender Score: 0       ASSESSMENT AND PLAN: Persistent Atrial Fibrillation (ICD10:  I48.19) The patient's CHA2DS2-VASc score is 7, indicating a 11.2% annual risk of stroke.    S/p Afib/SVT ablation on 09/17/23 by Dr. Kennyth.  Patient is currently in NSR. Continue Toprol  25 mg AM 50 mg PM.    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  Continue Apixaban  (Eliquis ).  Continue Eliquis  without interruption.      Follow up with EP as scheduled.   [facility], [facility] [address] [place], [place] [address] [phone]

## 2023-10-17 ENCOUNTER — Encounter: Payer: Self-pay | Admitting: Urology

## 2023-10-18 ENCOUNTER — Other Ambulatory Visit: Payer: Self-pay | Admitting: Urology

## 2023-10-18 DIAGNOSIS — N401 Enlarged prostate with lower urinary tract symptoms: Secondary | ICD-10-CM

## 2023-10-18 DIAGNOSIS — R339 Retention of urine, unspecified: Secondary | ICD-10-CM

## 2023-10-18 MED ORDER — TAMSULOSIN HCL 0.4 MG PO CAPS: 0.4000 mg | ORAL_CAPSULE | Freq: Two times a day (BID) | ORAL | 5 refills | Status: DC

## 2023-10-20 ENCOUNTER — Other Ambulatory Visit: Payer: Self-pay | Admitting: *Deleted

## 2023-10-20 DIAGNOSIS — D696 Thrombocytopenia, unspecified: Secondary | ICD-10-CM

## 2023-10-20 DIAGNOSIS — D509 Iron deficiency anemia, unspecified: Secondary | ICD-10-CM

## 2023-10-20 DIAGNOSIS — D591 Autoimmune hemolytic anemia, unspecified: Secondary | ICD-10-CM

## 2023-10-20 DIAGNOSIS — C9110 Chronic lymphocytic leukemia of B-cell type not having achieved remission: Secondary | ICD-10-CM

## 2023-10-20 MED ORDER — FAMCICLOVIR 250 MG PO TABS: 250.0000 mg | ORAL_TABLET | Freq: Two times a day (BID) | ORAL | 1 refills | Status: AC

## 2023-10-22 ENCOUNTER — Inpatient Hospital Stay: Admitting: Urgent Care

## 2023-11-06 ENCOUNTER — Other Ambulatory Visit: Payer: Self-pay | Admitting: Cardiology

## 2023-11-06 MED ORDER — METOPROLOL SUCCINATE ER 25 MG PO TB24: ORAL_TABLET | ORAL | 2 refills | Status: AC

## 2023-11-10 ENCOUNTER — Other Ambulatory Visit: Payer: Self-pay | Admitting: Cardiology

## 2023-11-10 DIAGNOSIS — H35372 Puckering of macula, left eye: Secondary | ICD-10-CM | POA: Diagnosis not present

## 2023-11-10 DIAGNOSIS — H43812 Vitreous degeneration, left eye: Secondary | ICD-10-CM | POA: Diagnosis not present

## 2023-11-10 DIAGNOSIS — H527 Unspecified disorder of refraction: Secondary | ICD-10-CM | POA: Diagnosis not present

## 2023-11-10 DIAGNOSIS — H26492 Other secondary cataract, left eye: Secondary | ICD-10-CM | POA: Diagnosis not present

## 2023-11-10 DIAGNOSIS — Z961 Presence of intraocular lens: Secondary | ICD-10-CM | POA: Diagnosis not present

## 2023-11-11 MED ORDER — ATORVASTATIN CALCIUM 20 MG PO TABS
20.0000 mg | ORAL_TABLET | Freq: Every day | ORAL | 2 refills | Status: AC
Start: 2023-11-11 — End: ?

## 2023-11-12 ENCOUNTER — Encounter: Payer: Self-pay | Admitting: Urology

## 2023-11-19 DIAGNOSIS — H26492 Other secondary cataract, left eye: Secondary | ICD-10-CM | POA: Diagnosis not present

## 2023-11-20 ENCOUNTER — Inpatient Hospital Stay: Attending: Hematology & Oncology

## 2023-11-20 ENCOUNTER — Inpatient Hospital Stay (HOSPITAL_BASED_OUTPATIENT_CLINIC_OR_DEPARTMENT_OTHER): Admitting: Hematology & Oncology

## 2023-11-20 VITALS — BP 122/65 | HR 63 | Temp 98.2°F | Wt 209.8 lb

## 2023-11-20 DIAGNOSIS — I480 Paroxysmal atrial fibrillation: Secondary | ICD-10-CM | POA: Insufficient documentation

## 2023-11-20 DIAGNOSIS — Z7901 Long term (current) use of anticoagulants: Secondary | ICD-10-CM | POA: Diagnosis not present

## 2023-11-20 DIAGNOSIS — C9110 Chronic lymphocytic leukemia of B-cell type not having achieved remission: Secondary | ICD-10-CM | POA: Insufficient documentation

## 2023-11-20 DIAGNOSIS — D591 Autoimmune hemolytic anemia, unspecified: Secondary | ICD-10-CM | POA: Diagnosis not present

## 2023-11-20 DIAGNOSIS — Z7952 Long term (current) use of systemic steroids: Secondary | ICD-10-CM | POA: Diagnosis not present

## 2023-11-20 LAB — CMP (CANCER CENTER ONLY)
ALT: 26 U/L (ref 0–44)
AST: 25 U/L (ref 15–41)
Albumin: 4.5 g/dL (ref 3.5–5.0)
Alkaline Phosphatase: 61 U/L (ref 38–126)
Anion gap: 12 (ref 5–15)
BUN: 17 mg/dL (ref 8–23)
CO2: 27 mmol/L (ref 22–32)
Calcium: 9.8 mg/dL (ref 8.9–10.3)
Chloride: 103 mmol/L (ref 98–111)
Creatinine: 1.16 mg/dL (ref 0.61–1.24)
GFR, Estimated: >60 mL/min (ref >60)
Glucose, Bld: 130 mg/dL — ABNORMAL HIGH (ref 70–99)
Potassium: 4.8 mmol/L (ref 3.5–5.1)
Sodium: 142 mmol/L (ref 135–145)
Total Bilirubin: 0.7 mg/dL (ref 0.0–1.2)
Total Protein: 6.5 g/dL (ref 6.5–8.1)

## 2023-11-20 LAB — CBC WITH DIFFERENTIAL (CANCER CENTER ONLY)
Abs Immature Granulocytes: 0.07 K/uL (ref 0.00–0.07)
Basophils Absolute: 0.1 K/uL (ref 0.0–0.1)
Basophils Relative: 0 %
Eosinophils Absolute: 0.1 K/uL (ref 0.0–0.5)
Eosinophils Relative: 1 %
HCT: 44.7 % (ref 39.0–52.0)
Hemoglobin: 15.2 g/dL (ref 13.0–17.0)
Immature Granulocytes: 1 %
Lymphocytes Relative: 41 %
Lymphs Abs: 5.5 K/uL — ABNORMAL HIGH (ref 0.7–4.0)
MCH: 33.8 pg (ref 26.0–34.0)
MCHC: 34 g/dL (ref 30.0–36.0)
MCV: 99.3 fL (ref 80.0–100.0)
Monocytes Absolute: 0.7 K/uL (ref 0.1–1.0)
Monocytes Relative: 5 %
Neutro Abs: 6.8 K/uL (ref 1.7–7.7)
Neutrophils Relative %: 52 %
Platelet Count: 96 K/uL — ABNORMAL LOW (ref 150–400)
RBC: 4.5 MIL/uL (ref 4.22–5.81)
RDW: 13.6 % (ref 11.5–15.5)
Smear Review: NORMAL
WBC Count: 13.2 K/uL — ABNORMAL HIGH (ref 4.0–10.5)
nRBC: 0 % (ref 0.0–0.2)

## 2023-11-20 LAB — LACTATE DEHYDROGENASE: LDH: 212 U/L (ref 105–235)

## 2023-11-20 LAB — SAVE SMEAR(SSMR), FOR PROVIDER SLIDE REVIEW

## 2023-11-20 NOTE — Progress Notes (Signed)
 Hematology and Oncology Follow Up Visit  Jason Abbott [id number] 06/01/1945 78 y.o. 11/20/2023   Principle Diagnosis:  Stage C CLL -autoimmune hemolytic anemia Atrial fibrillation -paroxysmal  Current Therapy:   Prednisone  80 mg p.o. daily-started on 08/20/2022 --decrease to 40 mg p.o. daily on 09/04/2022 Prednisone  10 mg p.o. daily Rituxan /Bendamustine -start treatment on 11/20/2022 -status post cycle 1-DC on 12/18/2022 due to poor tolerance Eliquis  5 mg p.o. twice daily-started on 04/06/2023   Interim History:  Jason Abbott is here today for follow-up.  Thankfully, his atrial fibrillation is doing better.  He had ablation and cardioversion.  He responded well.  He now is on Eliquis .  He is doing well on the Eliquis .  There is been no bleeding.SABRA  He still feels a little tired.  However, this is better.  He has had no fever.  He has had no nausea or vomiting.  Has had no change in bowel or bladder habits.  He has had no rashes.  He did have a little bit of injury to his left lower leg.  This is a little bit longer to heal up but it is healing.  He has had no headache.  Overall, I will say that his performance status is probably ECOG 1.   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        Medication List        Accurate as of November 20, 2023  1:47 PM. If you have any questions, ask your nurse or doctor.          apixaban  5 MG Tabs tablet Commonly known as: ELIQUIS  Take 1 tablet (5 mg total) by mouth 2 (two) times daily.   atorvastatin  20 MG tablet Commonly known as: LIPITOR Take 1 tablet (20 mg total) by mouth daily.   cephALEXin  250 MG capsule Commonly known as: KEFLEX  Take 1 capsule (250 mg total) by mouth daily.    cyanocobalamin  1000 MCG tablet Commonly known as: VITAMIN B12 Take 1,000 mcg by mouth daily.   famciclovir  250 MG tablet Commonly known as: FAMVIR  Take 1 tablet (250 mg total) by mouth 2 (two) times daily.   ferrous sulfate 325 (65 FE) MG tablet Take 325 mg by mouth daily.   finasteride  5 MG tablet Commonly known as: PROSCAR  Take 1 tablet (5 mg total) by mouth daily.   fluconazole  100 MG tablet Commonly known as: DIFLUCAN  Take 1 tablet (100 mg total) by mouth daily.   folic acid  1 MG tablet Commonly known as: FOLVITE  Take 2 tablets (2 mg total) by mouth daily.   furosemide  20 MG tablet Commonly known as: LASIX  Take 1 tablet (20 mg total) by mouth daily as needed for edema or fluid (in case of weight gain 2 to 3 lbs in 24 hrs or 5 lbs in 7 days.).   Mens Multivitamin Gummies Chew Chew 2 each by mouth daily.   metoprolol  succinate 25 MG 24 hr tablet Commonly known as: Toprol  XL Take 1 tablet (25 mg total) by mouth in the morning AND 2 tablets (50 mg total) at bedtime.   potassium chloride  10 MEQ tablet Commonly known as: KLOR-CON  M Take 1 tablet (10 mEq total)  by mouth daily as needed (take onnly when taking furosemide ).   predniSONE  10 MG tablet Commonly known as: DELTASONE  TAKE 1 TABLET (10 MG TOTAL) BY MOUTH DAILY WITH BREAKFAST.   tamsulosin  0.4 MG Caps capsule Commonly known as: FLOMAX  Take 1 capsule (0.4 mg total) by mouth in the morning and at bedtime.   Unisom  Sleepgels 50 MG Caps Generic drug: diphenhydrAMINE  HCl (Sleep) Take 50 mg by mouth at bedtime.   Vitamin D3 125 MCG (5000 UT) Caps Take 5,000 Units by mouth daily.        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     Past Medical History, Surgical history, Social history, and Family History were reviewed and updated.  Review of Systems: Review of Systems  Constitutional:  Positive for  malaise/fatigue.  HENT: Negative.    Eyes: Negative.   Respiratory: Negative.    Cardiovascular: Negative.   Gastrointestinal: Negative.   Genitourinary: Negative.   Musculoskeletal: Negative.   Skin: Negative.   Neurological: Negative.   Endo/Heme/Allergies: Negative.   Psychiatric/Behavioral: Negative.       Physical Exam:  Vital signs are temperature 98.2.  Pulse 63.  Blood pressure 122/65.  Weight is 209 pounds.   Wt Readings from Last 3 Encounters:  10/15/23 207 lb 6.4 oz (94.1 kg)  09/17/23 200 lb (90.7 kg)  09/10/23 198 lb (89.8 kg)    Physical Exam Vitals reviewed.  HENT:     Head: Normocephalic and atraumatic.  Eyes:     Pupils: Pupils are equal, round, and reactive to light.  Cardiovascular:     Rate and Rhythm: Normal rate and regular rhythm.     Heart sounds: Normal heart sounds.  Pulmonary:     Effort: Pulmonary effort is normal.     Breath sounds: Normal breath sounds.  Abdominal:     General: Bowel sounds are normal.     Palpations: Abdomen is soft.  Musculoskeletal:        General: No tenderness or deformity. Normal range of motion.     Cervical back: Normal range of motion.  Lymphadenopathy:     Cervical: No cervical adenopathy.  Skin:    General: Skin is warm and dry.     Findings: No erythema or rash.  Neurological:     Mental Status: He is alert and oriented to person, place, and time.  Psychiatric:        Behavior: Behavior normal.        Thought Content: Thought content normal.        Judgment: Judgment normal.      Lab Results  Component Value Date   WBC 13.8 (H) 09/09/2023   HGB 14.5 09/09/2023   HCT 43.2 09/09/2023   MCV 99.5 09/09/2023   PLT 88 (L) 09/09/2023   Lab Results  Component Value Date   FERRITIN 56 08/31/2023   IRON 111 08/31/2023   TIBC 360 08/31/2023   UIBC 249 08/31/2023   IRONPCTSAT 31 08/31/2023   Lab Results  Component Value Date   RETICCTPCT 2.5 08/31/2023   RBC 4.34 09/09/2023   Lab Results   Component Value Date   KPAFRELGTCHN 12.8 08/26/2021   LAMBDASER 24.9 08/26/2021   KAPLAMBRATIO 0.51 08/26/2021   Lab Results  Component Value Date   IGGSERUM 808 08/26/2021   IGA 149 08/26/2021   IGMSERUM 39 08/26/2021   No results found for: TOTALPROTELP, ALBUMINELP,  BENSON MARKEL EARLA JOANNIE DOC VICK, SPEI   Chemistry      Component Value Date/Time   NA 143 09/09/2023 0222   NA 142 11/05/2022 1127   K 3.7 09/09/2023 0222   CL 102 09/09/2023 0222   CO2 28 09/09/2023 0222   BUN 20 09/09/2023 0222   BUN 18 11/05/2022 1127   CREATININE 1.08 09/09/2023 0222   CREATININE 1.15 08/31/2023 1335   CREATININE 1.03 08/15/2021 0000      Component Value Date/Time   CALCIUM  9.4 09/09/2023 0222   ALKPHOS 46 09/08/2023 0317   AST 25 09/08/2023 0317   AST 19 08/31/2023 1335   ALT 17 09/08/2023 0317   ALT 16 08/31/2023 1335   BILITOT 0.8 09/08/2023 0317   BILITOT 0.5 08/31/2023 1335       Impression and Plan: Mr. Mizrahi is a very pleasant 78 yo caucasian gentleman with stage C CLL. He developed autoimmune hemolytic anemia.  We started him on prednisone  which he  tolerated well.   We then gave him 1 cycle of Rituxan /Bendamustine .  He tolerated this incredibly poorly.  Since then, we have just watched him.  I have he has blood counts are doing well.  Everything is holding pretty steady.  As such, I do not see that we have to do anything with respect to the CLL.  I think we can probably try to get him through the Holiday season and get him back through the Winter.  If he has any problems between now and when we see him back, he can always call us .   Maude JONELLE Crease, MD 11/14/20251:47 PM

## 2023-11-28 ENCOUNTER — Other Ambulatory Visit: Payer: Self-pay | Admitting: Hematology & Oncology

## 2023-11-28 ENCOUNTER — Encounter: Payer: Self-pay | Admitting: Urology

## 2023-11-28 DIAGNOSIS — D509 Iron deficiency anemia, unspecified: Secondary | ICD-10-CM

## 2023-11-28 DIAGNOSIS — D591 Autoimmune hemolytic anemia, unspecified: Secondary | ICD-10-CM

## 2023-11-28 DIAGNOSIS — D696 Thrombocytopenia, unspecified: Secondary | ICD-10-CM

## 2023-11-28 DIAGNOSIS — C9110 Chronic lymphocytic leukemia of B-cell type not having achieved remission: Secondary | ICD-10-CM

## 2023-11-29 ENCOUNTER — Telehealth: Payer: Self-pay | Admitting: Cardiology

## 2023-11-29 ENCOUNTER — Other Ambulatory Visit: Payer: Self-pay | Admitting: Urology

## 2023-11-29 DIAGNOSIS — N401 Enlarged prostate with lower urinary tract symptoms: Secondary | ICD-10-CM

## 2023-11-29 DIAGNOSIS — R339 Retention of urine, unspecified: Secondary | ICD-10-CM

## 2023-11-29 DIAGNOSIS — N390 Urinary tract infection, site not specified: Secondary | ICD-10-CM

## 2023-11-29 MED ORDER — CEPHALEXIN 250 MG PO CAPS: 250.0000 mg | ORAL_CAPSULE | Freq: Every day | ORAL | 0 refills | Status: DC

## 2023-11-29 NOTE — Telephone Encounter (Signed)
 Patient/wife called in reporting that they checked his heart rate/rhythm with his [employer] mobile yesterday and noted that he was in atrial fibrillation with rates in the 100s.  He has been asymptomatic.  They again checked today with [employer] mobile and noted that he was still in atrial fibrillation with heart rates in the 90s.  He has been compliant with his Eliquis  as well as his metoprolol .  Again he has been asymptomatic.  I advised to continue on his medications currently without significant change as they do not have a blood pressure reading but he has had no lightheadedness or dizziness.  Will route to A-fib clinic to bring the patient back in for a follow-up visit to determine further recommendations.  Patient and wife thanked me for callback.

## 2023-11-30 ENCOUNTER — Encounter: Payer: Self-pay | Admitting: Cardiology

## 2023-12-01 ENCOUNTER — Other Ambulatory Visit: Payer: Self-pay | Admitting: *Deleted

## 2023-12-01 ENCOUNTER — Encounter: Payer: Self-pay | Admitting: Hematology & Oncology

## 2023-12-01 DIAGNOSIS — D696 Thrombocytopenia, unspecified: Secondary | ICD-10-CM

## 2023-12-01 DIAGNOSIS — D591 Autoimmune hemolytic anemia, unspecified: Secondary | ICD-10-CM

## 2023-12-01 DIAGNOSIS — D509 Iron deficiency anemia, unspecified: Secondary | ICD-10-CM

## 2023-12-01 DIAGNOSIS — C9110 Chronic lymphocytic leukemia of B-cell type not having achieved remission: Secondary | ICD-10-CM

## 2023-12-01 MED ORDER — FLUCONAZOLE 100 MG PO TABS: 100.0000 mg | ORAL_TABLET | Freq: Every day | ORAL | 1 refills | Status: AC

## 2023-12-02 ENCOUNTER — Ambulatory Visit
Admission: RE | Admit: 2023-12-02 | Discharge: 2023-12-02 | Disposition: A | Discharge status: Home / Self Care | Attending: Family Medicine | Admitting: Family Medicine

## 2023-12-02 ENCOUNTER — Ambulatory Visit (HOSPITAL_COMMUNITY)
Admission: RE | Admit: 2023-12-02 | Discharge: 2023-12-02 | Disposition: A | Discharge status: Home / Self Care | Source: Ambulatory Visit | Attending: Internal Medicine | Admitting: Internal Medicine

## 2023-12-02 ENCOUNTER — Encounter (HOSPITAL_COMMUNITY): Payer: Self-pay | Admitting: Internal Medicine

## 2023-12-02 VITALS — BP 132/94 | HR 86 | Ht 70.5 in | Wt 208.2 lb

## 2023-12-02 VITALS — BP 123/63 | HR 99 | Temp 97.6°F | Resp 18 | Ht 70.5 in | Wt 204.0 lb

## 2023-12-02 DIAGNOSIS — D6869 Other thrombophilia: Secondary | ICD-10-CM | POA: Diagnosis not present

## 2023-12-02 DIAGNOSIS — I480 Paroxysmal atrial fibrillation: Secondary | ICD-10-CM | POA: Diagnosis not present

## 2023-12-02 DIAGNOSIS — I4819 Other persistent atrial fibrillation: Secondary | ICD-10-CM | POA: Diagnosis not present

## 2023-12-02 DIAGNOSIS — S39012A Strain of muscle, fascia and tendon of lower back, initial encounter: Secondary | ICD-10-CM | POA: Diagnosis not present

## 2023-12-02 MED ORDER — OXYCODONE-ACETAMINOPHEN 5-325 MG PO TABS: 1.0000 | ORAL_TABLET | Freq: Four times a day (QID) | ORAL | 0 refills | Status: AC | PRN

## 2023-12-02 MED ORDER — BACLOFEN 10 MG PO TABS: 10.0000 mg | ORAL_TABLET | Freq: Three times a day (TID) | ORAL | 0 refills | Status: AC

## 2023-12-02 NOTE — H&P (View-Only) (Signed)
 Primary Care Physician: Jason Debby PARAS, MD Primary Cardiologist: None Electrophysiologist: Fonda Kitty, MD     Referring Physician: Dr. Kitty Ryder Bertis Abbott. is a 78 y.o. male with a history of TAA s/p repair, nonobstructive CAD, RBBB, CLL, autoimmune hemolytic anemia, HLD, remote CVA, BPH, and persistent atrial fibrillation who presents for consultation in the [facility]. Patient is on Eliquis  5 mg BID for stroke prevention. S/p Afib/SVT ablation on 09/17/23 by Dr. Kitty.   On follow-up 12/02/2023, patient is currently in A-fib.  Patient contacted office on 11/24 noting that his [employer] mobile showed he was in A-fib.  He does not have cardiac awareness and does not know when the A-fib episode began.  No missed doses of Eliquis .  Today, he denies symptoms of orthopnea, PND, lower extremity edema, dizziness, presyncope, syncope, snoring, daytime somnolence, bleeding, or neurologic sequela. The patient is tolerating medications without difficulties and is otherwise without complaint today.    he has a BMI of Body mass index is 29.45 kg/m.Jason Abbott Filed Weights   12/02/23 1508  Weight: 94.4 kg     Current Outpatient Medications  Medication Sig Dispense Refill   apixaban  (ELIQUIS ) 5 MG TABS tablet Take 1 tablet (5 mg total) by mouth 2 (two) times daily. 180 tablet 1   atorvastatin  (LIPITOR) 20 MG tablet Take 1 tablet (20 mg total) by mouth daily. 90 tablet 2   cephALEXin  (KEFLEX ) 250 MG capsule Take 1 capsule (250 mg total) by mouth daily. 90 capsule 0   Cholecalciferol  (VITAMIN D3) 125 MCG (5000 UT) CAPS Take 5,000 Units by mouth daily.     cyanocobalamin  (VITAMIN B12) 1000 MCG tablet Take 1,000 mcg by mouth daily.     diphenhydrAMINE  HCl, Sleep, (UNISOM  SLEEPGELS) 50 MG CAPS Take 50 mg by mouth at bedtime.     famciclovir  (FAMVIR ) 250 MG tablet Take 1 tablet (250 mg total) by mouth 2 (two) times daily. 180 tablet 1   ferrous sulfate 325 (65  FE) MG tablet Take 325 mg by mouth daily.     finasteride  (PROSCAR ) 5 MG tablet Take 1 tablet (5 mg total) by mouth daily. 90 tablet 0   fluconazole  (DIFLUCAN ) 100 MG tablet Take 1 tablet (100 mg total) by mouth daily. 90 tablet 1   folic acid  (FOLVITE ) 1 MG tablet Take 2 tablets (2 mg total) by mouth daily. 180 tablet 1   furosemide  (LASIX ) 20 MG tablet Take 1 tablet (20 mg total) by mouth daily as needed for edema or fluid (in case of weight gain 2 to 3 lbs in 24 hrs or 5 lbs in 7 days.). 30 tablet 0   metoprolol  succinate (TOPROL  XL) 25 MG 24 hr tablet Take 1 tablet (25 mg total) by mouth in the morning AND 2 tablets (50 mg total) at bedtime. 270 tablet 2   Multiple Vitamins-Minerals (MENS MULTIVITAMIN GUMMIES) CHEW Chew 2 each by mouth daily.     potassium chloride  (KLOR-CON  M) 10 MEQ tablet Take 1 tablet (10 mEq total) by mouth daily as needed (take onnly when taking furosemide ). 30 tablet 0   predniSONE  (DELTASONE ) 10 MG tablet TAKE 1 TABLET (10 MG TOTAL) BY MOUTH DAILY WITH BREAKFAST. 30 tablet 1   tamsulosin  (FLOMAX ) 0.4 MG CAPS capsule Take 1 capsule (0.4 mg total) by mouth in the morning and at bedtime. 60 capsule 5   No current facility-administered medications for this encounter.    Atrial Fibrillation Management history:  Previous antiarrhythmic drugs: none Previous cardioversions: 09/08/23 Previous ablations: 09/17/23 Anticoagulation history: Eliquis    ROS- All systems are reviewed and negative except as per the HPI above.  Physical Exam: BP (!) 132/94   Pulse 86   Ht 5' 10.5 (1.791 m)   Wt 94.4 kg   BMI 29.45 kg/m   GEN- The patient is well appearing, alert and oriented x 3 today.   Neck - no JVD or carotid bruit noted Lungs- Clear to ausculation bilaterally, normal work of breathing Heart- Irregular rate and rhythm, no murmurs, rubs or gallops, PMI not laterally displaced Extremities- no clubbing, cyanosis, or edema Skin - no rash or ecchymosis noted  EKG today  demonstrates  EKG Interpretation Date/Time:  Wednesday December 02 2023 15:10:08 EST Ventricular Rate:  86 PR Interval:    QRS Duration:  142 QT Interval:  396 QTC Calculation: 473 R Axis:   -82  Text Interpretation: Atrial fibrillation Left axis deviation Right bundle branch block T wave abnormality, consider lateral ischemia Abnormal ECG When compared with ECG of 15-Oct-2023 15:17, Atrial fibrillation has replaced Sinus rhythm Confirmed by Jason Abbott [phone]) on 12/02/2023 3:26:45 PM    Echo 06/29/23 demonstrated  1. Left ventricular ejection fraction, by estimation, is 50 to 55%. The  left ventricle has low normal function. The left ventricle has no regional  wall motion abnormalities. There is mild concentric left ventricular  hypertrophy. Left ventricular  diastolic parameters are consistent with Grade I diastolic dysfunction  (impaired relaxation).   2. Right ventricular systolic function was not well visualized. The right  ventricular size is not well visualized.   3. The mitral valve is normal in structure. Trivial mitral valve  regurgitation. No evidence of mitral stenosis.   4. The aortic valve is tricuspid. Aortic valve regurgitation is not  visualized. Aortic valve sclerosis is present, with no evidence of aortic  valve stenosis.   5. Aortic dilatation noted. There is moderate dilatation of the ascending  aorta, measuring 47 mm.   ASSESSMENT & PLAN CHA2DS2-VASc Score = 7  The patient's score is based upon: CHF History: 1 HTN History: 1 Diabetes History: 0 Stroke History: 2 Vascular Disease History: 1 Age Score: 2 Gender Score: 0       ASSESSMENT AND PLAN: Persistent Atrial Fibrillation (ICD10:  I48.19) The patient's CHA2DS2-VASc score is 7, indicating a 11.2% annual risk of stroke.   S/p Afib/SVT ablation on 09/17/23 by Dr. Kennyth.  Patient is currently in A-fib. We discussed the procedure cardioversion to try to convert to NSR. We discussed the risks vs  benefits of this procedure and how ultimately we cannot predict whether a patient will have early return of arrhythmia post procedure. After discussion, the patient wishes to proceed with cardioversion. Labs drawn on 11/20/23.   Informed Consent   Shared Decision Making/Informed Consent The risks (stroke, cardiac arrhythmias rarely resulting in the need for a temporary or permanent pacemaker, skin irritation or burns and complications associated with conscious sedation including aspiration, arrhythmia, respiratory failure and death), benefits (restoration of normal sinus rhythm) and alternatives of a direct current cardioversion were explained in detail to Jason Abbott and he agrees to proceed.        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  Continue Apixaban  (Eliquis ).  No missed doses of Eliquis .     Follow up with EP as scheduled.    Jason Pac, PA-C  [facility] [address] [place], [place] [address] [phone]

## 2023-12-02 NOTE — ED Provider Notes (Signed)
 Jason Abbott CARE    CSN: [id number] Arrival date & time: 12/02/23  1821      History   Chief Complaint Chief Complaint  Patient presents with   Back Pain    Left side middle/lower pain in back. - Entered by patient    HPI Jason Abbott. is a 78 y.o. male.   Patient is known to me from prior visits.  He is under the care of cardiology for chronic atrial fibrillation and has had surgery for aortic aneurysm.  He also has GERD.  He is doing well on medications.  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  His back does not bother him often.  The last couple of days he has had pain in his left low back.  It is not well relieved with Exer strength Tylenol .  He is limited in his medication use because of his Eliquis .  He is here to see what he can take for discomfort.  Wife states he did not sleep last night    Past Medical History:  Diagnosis Date   Aberrant right subclavian artery    Aortic arch aneurysm    Arthritis    Atrial fibrillation with rapid ventricular response (HCC) 04/03/2023   Bowel obstruction (HCC)    CLL (chronic lymphocytic leukemia) (HCC)    COVID 2021   hospitalized for it   Diverticul disease small and large intestine, no perforati or abscess    GERD (gastroesophageal reflux disease)    uses tums as needed   H/O urinary infection    Pneumonia 09/22/2021   Shingles outbreak 10/03/2013   Stroke (HCC) 01/2019    Patient Active Problem List   Diagnosis Date Noted   CHF exacerbation (HCC) 09/07/2023   Acute on chronic diastolic CHF (congestive heart failure) (HCC) 09/07/2023   Paroxysmal atrial fibrillation (HCC) 04/03/2023   Atrial fibrillation with RVR (HCC) 04/03/2023   Paroxysmal atrial fibrillation with RVR (HCC) 04/03/2023   Hallucination, prednisone  induced 10/22/2022   Recurrent UTI 07/24/2022   Atrial fibrillation (HCC) 01/21/2022   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   Atherosclerosis of aortic arch  10/17/2021   Hyponatremia 09/13/2021   Hypoalbuminemia 09/13/2021   BPH (benign prostatic hyperplasia) 09/13/2021   Thrombocytopenia 09/09/2021   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   Ingrown right greater toenail 08/12/2021   Right hand pain 03/11/2021   Tinnitus 03/11/2021   Former smoker 12/26/2020   Renal cyst 12/26/2020   Elevated TSH 09/12/2020   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   CLL (chronic lymphocytic leukemia) (HCC) 02/23/2019   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/27/2019   Lumbar spinal stenosis 12/21/2018   Numbness and tingling in both hands 10/20/2018   Hyperlipidemia, mixed 06/01/2018   Seborrheic keratosis 01/12/2018   Annual physical exam 04/26/2015   Dyspepsia 02/23/2014   Peptic ulcer disease 02/23/2014   Right shoulder pain 10/03/2013    Past Surgical History:  Procedure Laterality Date   AORTIC ARCH DEBRANCHING N/A 01/10/2022   Procedure: AORTIC ARCH DEBRANCHING WITH 16 X 8 X 10 MM HEMASHIELD GOLD GRAFT;  Surgeon: Lucas Dorise POUR, MD;  Location: [facility] OR;  Service: Open Heart Surgery;  Laterality: N/A;   ATRIAL FIBRILLATION ABLATION N/A 09/17/2023   Procedure: ATRIAL FIBRILLATION ABLATION;  Surgeon: Kennyth Chew, MD;  Location: [facility] INVASIVE CV LAB;  Service: Cardiovascular;  Laterality: N/A;   CARDIOVERSION N/A 04/06/2023   Procedure: CARDIOVERSION;  Surgeon: Barbaraann Darryle Ned, MD;  Location: [facility] INVASIVE CV LAB;  Service: Cardiovascular;  Laterality: N/A;   CARDIOVERSION N/A 09/08/2023   Procedure: CARDIOVERSION;  Surgeon: Sheena Pugh, DO;  Location: [facility] INVASIVE CV LAB;  Service: Cardiovascular;  Laterality: N/A;   CAROTID-SUBCLAVIAN BYPASS GRAFT Right 12/18/2021   Procedure: RIGHT BYPASS GRAFT CAROTID-SUBCLAVIAN;  Surgeon: Serene Gaile ORN, MD;  Location: [facility] OR;  Service: Vascular;  Laterality: Right;   CARPAL TUNNEL RELEASE Right 11/25/2021   Procedure: CARPAL TUNNEL RELEASE;  Surgeon: Romona Harari, MD;   Location: [facility] OR;  Service: Orthopedics;  Laterality: Right;  or MAC with regional block 60   HERNIA REPAIR     MECKEL DIVERTICULUM EXCISION     ORIF FINGER / THUMB FRACTURE     SVT ABLATION N/A 09/17/2023   Procedure: SVT ABLATION;  Surgeon: Kennyth Chew, MD;  Location: [facility] INVASIVE CV LAB;  Service: Cardiovascular;  Laterality: N/A;   TEE WITHOUT CARDIOVERSION N/A 01/10/2022   Procedure: TRANSESOPHAGEAL ECHOCARDIOGRAM (TEE);  Surgeon: Lucas Dorise POUR, MD;  Location: [facility] OR;  Service: Open Heart Surgery;  Laterality: N/A;   TENOSYNOVECTOMY Right 11/25/2021   Procedure: FLEXOR TENOSYNOVECTOMY;  Surgeon: Romona Harari, MD;  Location: [facility] OR;  Service: Orthopedics;  Laterality: Right;  or MAC with regional block 60   THORACIC AORTIC ENDOVASCULAR STENT GRAFT N/A 01/10/2022   Procedure: THORACIC AORTIC ENDOVASCULAR STENT GRAFT;  Surgeon: Serene Gaile ORN, MD;  Location[facility] OR;  Service: Vascular;  Laterality: N/A;   TRANSESOPHAGEAL ECHOCARDIOGRAM (CATH LAB) N/A 04/06/2023   Procedure: TRANSESOPHAGEAL ECHOCARDIOGRAM;  Surgeon: Barbaraann Darryle Ned, MD;  Location: [facility] INVASIVE CV LAB;  Service: Cardiovascular;  Laterality: N/A;   ulna nerve surgery         Home Medications    Prior to Admission medications   Medication Sig Start Date End Date Taking? Authorizing Provider  apixaban  (ELIQUIS ) 5 MG TABS tablet Take 1 tablet (5 mg total) by mouth 2 (two) times daily. 08/10/23  Yes Ladona Heinz, MD  atorvastatin  (LIPITOR) 20 MG tablet Take 1 tablet (20 mg total) by mouth daily. 11/11/23  Yes Kennyth Chew, MD  baclofen  (LIORESAL ) 10 MG tablet Take 1 tablet (10 mg total) by mouth 3 (three) times daily. 12/02/23  Yes Maranda Jamee Jacob, MD  cephALEXin  (KEFLEX ) 250 MG capsule Take 1 capsule (250 mg total) by mouth daily. 11/29/23  Yes Stoneking, Adine PARAS., MD  Cholecalciferol  (VITAMIN D3) 125 MCG (5000 UT) CAPS Take 5,000 Units by mouth daily.   Yes [provider]  cyanocobalamin  (VITAMIN B12)  1000 MCG tablet Take 1,000 mcg by mouth daily.   Yes [provider]  diphenhydrAMINE  HCl, Sleep, (UNISOM  SLEEPGELS) 50 MG CAPS Take 50 mg by mouth at bedtime.   Yes [provider]  famciclovir  (FAMVIR ) 250 MG tablet Take 1 tablet (250 mg total) by mouth 2 (two) times daily. 10/20/23  Yes Timmy Maude SAUNDERS, MD  ferrous sulfate 325 (65 FE) MG tablet Take 325 mg by mouth daily.   Yes [provider]  finasteride  (PROSCAR ) 5 MG tablet Take 1 tablet (5 mg total) by mouth daily. 08/10/23  Yes Stoneking, Adine PARAS., MD  fluconazole  (DIFLUCAN ) 100 MG tablet Take 1 tablet (100 mg total) by mouth daily. 12/01/23  Yes Timmy Maude SAUNDERS, MD  folic acid  (FOLVITE ) 1 MG tablet Take 2 tablets (2 mg total) by mouth daily. 06/09/23  Yes Timmy Maude SAUNDERS, MD  furosemide  (LASIX ) 20 MG tablet Take 1 tablet (20 mg total) by mouth daily as needed for edema  or fluid (in case of weight gain 2 to 3 lbs in 24 hrs or 5 lbs in 7 days.). 09/09/23  Yes Arrien, Elidia Sieving, MD  metoprolol  succinate (TOPROL  XL) 25 MG 24 hr tablet Take 1 tablet (25 mg total) by mouth in the morning AND 2 tablets (50 mg total) at bedtime. 11/06/23  Yes Kennyth Chew, MD  Multiple Vitamins-Minerals (MENS MULTIVITAMIN GUMMIES) CHEW Chew 2 each by mouth daily.   Yes [provider]  oxyCODONE -acetaminophen  (PERCOCET/ROXICET) 5-325 MG tablet Take 1 tablet by mouth every 6 (six) hours as needed for severe pain (pain score 7-10). 12/02/23  Yes Maranda Jamee Jacob, MD  potassium chloride  (KLOR-CON  M) 10 MEQ tablet Take 1 tablet (10 mEq total) by mouth daily as needed (take onnly when taking furosemide ). 09/09/23  Yes Arrien, Elidia Sieving, MD  predniSONE  (DELTASONE ) 10 MG tablet TAKE 1 TABLET (10 MG TOTAL) BY MOUTH DAILY WITH BREAKFAST. 11/28/23  Yes Ennever, Maude SAUNDERS, MD  tamsulosin  (FLOMAX ) 0.4 MG CAPS capsule Take 1 capsule (0.4 mg total) by mouth in the morning and at bedtime. 10/18/23  Yes Stoneking, Adine PARAS., MD     Family History Family History  Problem Relation Age of Onset   Cancer Mother        pancreatic   COPD Father    Diabetes Sister    Diabetes Brother    Diabetes Brother     Social History Social History   Tobacco Use   Smoking status: Former    Current packs/day: 0.00    Average packs/day: 0.5 packs/day for 23.0 years (11.5 ttl pk-yrs)    Types: Cigarettes    Start date: 1966    Quit date: 1989    Years since quitting: 36.9   Smokeless tobacco: Never   Tobacco comments:    Former smoker 10/15/23  Vaping Use   Vaping status: Never Used  Substance Use Topics   Alcohol use: Not Currently    Comment: rarely   Drug use: No     Allergies   Levaquin  [levofloxacin ]   Review of Systems Review of Systems See HPI  Physical Exam Triage Vital Signs ED Triage Vitals  Encounter Vitals Group     BP 12/02/23 1831 123/63     Girls Systolic BP Percentile --      Girls Diastolic BP Percentile --      Boys Systolic BP Percentile --      Boys Diastolic BP Percentile --      Pulse Rate 12/02/23 1831 99     Resp 12/02/23 1831 18     Temp 12/02/23 1831 97.6 F (36.4 C)     Temp Source 12/02/23 1831 Oral     SpO2 12/02/23 1831 95 %     Weight 12/02/23 1830 204 lb (92.5 kg)     Height 12/02/23 1830 5' 10.5 (1.791 m)     Head Circumference --      Peak Flow --      Pain Score 12/02/23 1830 2     Pain Loc --      Pain Education --      Exclude from Growth Chart --    No data found.  Updated Vital Signs BP 123/63 (BP Location: Right Arm)   Pulse 99   Temp 97.6 F (36.4 C) (Oral)   Resp 18   Ht 5' 10.5 (1.791 m)   Wt 92.5 kg   SpO2 95%   BMI 28.86 kg/m      Physical  Exam Constitutional:      General: He is not in acute distress.    Appearance: He is well-developed.  HENT:     Head: Normocephalic and atraumatic.  Eyes:     Conjunctiva/sclera: Conjunctivae normal.     Pupils: Pupils are equal, round, and reactive to light.  Cardiovascular:     Rate and  Rhythm: Normal rate.  Pulmonary:     Effort: Pulmonary effort is normal. No respiratory distress.  Abdominal:     General: There is no distension.     Palpations: Abdomen is soft.  Musculoskeletal:        General: Tenderness present. Normal range of motion.     Cervical back: Normal range of motion.     Comments: Tenderness and tightness in the left lumbar column of muscles.  Range of motion is slow but full.  No neurofindings.  Negative straight leg raise  Skin:    General: Skin is warm and dry.  Neurological:     General: No focal deficit present.     Mental Status: He is alert.     Sensory: No sensory deficit.     Motor: No weakness.     Gait: Gait normal.     Deep Tendon Reflexes: Reflexes normal.      UC Treatments / Results  Labs (all labs ordered are listed, but only abnormal results are displayed) Labs Reviewed - No data to display  EKG   Radiology No results found.  Procedures Procedures (including critical care time)  Medications Ordered in UC Medications - No data to display  Initial Impression / Assessment and Plan / UC Course  I have reviewed the triage vital signs and the nursing notes.  Pertinent labs & imaging results that were available during my care of the patient were reviewed by me and considered in my medical decision making (see chart for details).     Final Clinical Impressions(s) / UC Diagnoses   Final diagnoses:  Strain of lumbar region, initial encounter     Discharge Instructions      Take extra strength Tylenol  as needed for moderate pain Take oxycodone  if needed for severe pain May take 1 oxycodone  every 6 hours.  If this is not strong enough may take a second pill Take the baclofen  up to 3 times a day as needed as muscle relaxer May try ice or heat to painful area See your doctor if not better by next week   ED Prescriptions     Medication Sig Dispense Auth. Provider   oxyCODONE -acetaminophen  (PERCOCET/ROXICET) 5-325  MG tablet Take 1 tablet by mouth every 6 (six) hours as needed for severe pain (pain score 7-10). 10 tablet Maranda Jamee Jacob, MD   baclofen  (LIORESAL ) 10 MG tablet Take 1 tablet (10 mg total) by mouth 3 (three) times daily. 30 each Maranda Jamee Jacob, MD      I have reviewed the PDMP during this encounter.   Maranda Jamee Jacob, MD 12/02/23 [phone]

## 2023-12-02 NOTE — ED Triage Notes (Signed)
 Patient c/o left sided lower back pain since yesterday.  No apparent injury.  Patient has taken Tylenol  ES did help patient sleep last night.  Denies any hematuria or urinary sx's.

## 2023-12-02 NOTE — Progress Notes (Signed)
 Primary Care Physician: [name], MD Primary Cardiologist: None Electrophysiologist: Fonda Kitty, MD     Referring Physician: Dr. Kitty Ryder Jason Abbott. is a 78 y.o. male with a history of TAA s/p repair, nonobstructive CAD, RBBB, CLL, autoimmune hemolytic anemia, HLD, remote CVA, BPH, and persistent atrial fibrillation who presents for consultation in the [facility]. Patient is on Eliquis  5 mg BID for stroke prevention. S/p Afib/SVT ablation on 09/17/23 by Dr. Kitty.   On follow-up 12/02/2023, patient is currently in A-fib.  Patient contacted office on 11/24 noting that his Crist mobile showed he was in A-fib.  He does not have cardiac awareness and does not know when the A-fib episode began.  No missed doses of Eliquis .  Today, he denies symptoms of orthopnea, PND, lower extremity edema, dizziness, presyncope, syncope, snoring, daytime somnolence, bleeding, or neurologic sequela. The patient is tolerating medications without difficulties and is otherwise without complaint today.    he has a BMI of Body mass index is 29.45 kg/m.SABRA Filed Weights   12/02/23 1508  Weight: 94.4 kg     Current Outpatient Medications  Medication Sig Dispense Refill   apixaban  (ELIQUIS ) 5 MG TABS tablet Take 1 tablet (5 mg total) by mouth 2 (two) times daily. 180 tablet 1   atorvastatin  (LIPITOR) 20 MG tablet Take 1 tablet (20 mg total) by mouth daily. 90 tablet 2   cephALEXin  (KEFLEX ) 250 MG capsule Take 1 capsule (250 mg total) by mouth daily. 90 capsule 0   Cholecalciferol  (VITAMIN D3) 125 MCG (5000 UT) CAPS Take 5,000 Units by mouth daily.     cyanocobalamin  (VITAMIN B12) 1000 MCG tablet Take 1,000 mcg by mouth daily.     diphenhydrAMINE  HCl, Sleep, (UNISOM  SLEEPGELS) 50 MG CAPS Take 50 mg by mouth at bedtime.     famciclovir  (FAMVIR ) 250 MG tablet Take 1 tablet (250 mg total) by mouth 2 (two) times daily. 180 tablet 1   ferrous sulfate 325 (65  FE) MG tablet Take 325 mg by mouth daily.     finasteride  (PROSCAR ) 5 MG tablet Take 1 tablet (5 mg total) by mouth daily. 90 tablet 0   fluconazole  (DIFLUCAN ) 100 MG tablet Take 1 tablet (100 mg total) by mouth daily. 90 tablet 1   folic acid  (FOLVITE ) 1 MG tablet Take 2 tablets (2 mg total) by mouth daily. 180 tablet 1   furosemide  (LASIX ) 20 MG tablet Take 1 tablet (20 mg total) by mouth daily as needed for edema or fluid (in case of weight gain 2 to 3 lbs in 24 hrs or 5 lbs in 7 days.). 30 tablet 0   metoprolol  succinate (TOPROL  XL) 25 MG 24 hr tablet Take 1 tablet (25 mg total) by mouth in the morning AND 2 tablets (50 mg total) at bedtime. 270 tablet 2   Multiple Vitamins-Minerals (MENS MULTIVITAMIN GUMMIES) CHEW Chew 2 each by mouth daily.     potassium chloride  (KLOR-CON  M) 10 MEQ tablet Take 1 tablet (10 mEq total) by mouth daily as needed (take onnly when taking furosemide ). 30 tablet 0   predniSONE  (DELTASONE ) 10 MG tablet TAKE 1 TABLET (10 MG TOTAL) BY MOUTH DAILY WITH BREAKFAST. 30 tablet 1   tamsulosin  (FLOMAX ) 0.4 MG CAPS capsule Take 1 capsule (0.4 mg total) by mouth in the morning and at bedtime. 60 capsule 5   No current facility-administered medications for this encounter.    Atrial Fibrillation Management history:  Previous antiarrhythmic drugs: none Previous cardioversions: 09/08/23 Previous ablations: 09/17/23 Anticoagulation history: Eliquis    ROS- All systems are reviewed and negative except as per the HPI above.  Physical Exam: BP (!) 132/94   Pulse 86   Ht 5' 10.5 (1.791 m)   Wt 94.4 kg   BMI 29.45 kg/m   GEN- The patient is well appearing, alert and oriented x 3 today.   Neck - no JVD or carotid bruit noted Lungs- Clear to ausculation bilaterally, normal work of breathing Heart- Irregular rate and rhythm, no murmurs, rubs or gallops, PMI not laterally displaced Extremities- no clubbing, cyanosis, or edema Skin - no rash or ecchymosis noted  EKG today  demonstrates  EKG Interpretation Date/Time:  Wednesday December 02 2023 15:10:08 EST Ventricular Rate:  86 PR Interval:    QRS Duration:  142 QT Interval:  396 QTC Calculation: 473 R Axis:   -82  Text Interpretation: Atrial fibrillation Left axis deviation Right bundle branch block T wave abnormality, consider lateral ischemia Abnormal ECG When compared with ECG of 15-Oct-2023 15:17, Atrial fibrillation has replaced Sinus rhythm Confirmed by Terra Pac [phone]) on 12/02/2023 3:26:45 PM    Echo 06/29/23 demonstrated  1. Left ventricular ejection fraction, by estimation, is 50 to 55%. The  left ventricle has low normal function. The left ventricle has no regional  wall motion abnormalities. There is mild concentric left ventricular  hypertrophy. Left ventricular  diastolic parameters are consistent with Grade I diastolic dysfunction  (impaired relaxation).   2. Right ventricular systolic function was not well visualized. The right  ventricular size is not well visualized.   3. The mitral valve is normal in structure. Trivial mitral valve  regurgitation. No evidence of mitral stenosis.   4. The aortic valve is tricuspid. Aortic valve regurgitation is not  visualized. Aortic valve sclerosis is present, with no evidence of aortic  valve stenosis.   5. Aortic dilatation noted. There is moderate dilatation of the ascending  aorta, measuring 47 mm.   ASSESSMENT & PLAN CHA2DS2-VASc Score = 7  The patient's score is based upon: CHF History: 1 HTN History: 1 Diabetes History: 0 Stroke History: 2 Vascular Disease History: 1 Age Score: 2 Gender Score: 0       ASSESSMENT AND PLAN: Persistent Atrial Fibrillation (ICD10:  I48.19) The patient's CHA2DS2-VASc score is 7, indicating a 11.2% annual risk of stroke.   S/p Afib/SVT ablation on 09/17/23 by Dr. Kennyth.  Patient is currently in A-fib. We discussed the procedure cardioversion to try to convert to NSR. We discussed the risks vs  benefits of this procedure and how ultimately we cannot predict whether a patient will have early return of arrhythmia post procedure. After discussion, the patient wishes to proceed with cardioversion. Labs drawn on 11/20/23.   Informed Consent   Shared Decision Making/Informed Consent The risks (stroke, cardiac arrhythmias rarely resulting in the need for a temporary or permanent pacemaker, skin irritation or burns and complications associated with conscious sedation including aspiration, arrhythmia, respiratory failure and death), benefits (restoration of normal sinus rhythm) and alternatives of a direct current cardioversion were explained in detail to Mr. Ducat and he agrees to proceed.        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  Continue Apixaban  (Eliquis ).  No missed doses of Eliquis .     Follow up with EP as scheduled.    Terra Pac, PA-C  [facility] [address] [place], [place] [address] [phone]

## 2023-12-02 NOTE — Patient Instructions (Signed)
 Cardioversion scheduled for: 12/07/23 Monday 11:00am   - Arrive at the [employer] [facility] ([address])  and check in with ADMITTING at 11:00am   - Do not eat or drink anything after midnight the night prior to your procedure.   - Take all your morning medication (except diabetic medications) with a sip of water prior to arrival.  - Do NOT miss any doses of your blood thinner - if you should miss a dose or take a dose more than 4 hours late -- please notify our office immediately.  - You will not be able to drive home after your procedure. Please ensure you have a responsible adult to drive you home. You will need someone with you for 24 hours post procedure.     - Expect to be in the procedural area approximately 2 hours.   - If you feel as if you go back into normal rhythm prior to scheduled cardioversion, please notify our office immediately.   If your procedure is canceled in the cardioversion suite you will be charged a cancellation fee.

## 2023-12-02 NOTE — Discharge Instructions (Signed)
 Take extra strength Tylenol  as needed for moderate pain Take oxycodone  if needed for severe pain May take 1 oxycodone  every 6 hours.  If this is not strong enough may take a second pill Take the baclofen  up to 3 times a day as needed as muscle relaxer May try ice or heat to painful area See your doctor if not better by next week

## 2023-12-04 NOTE — Progress Notes (Signed)
 Called patient with pre-procedure instructions for tomorrow.   Patient informed of:   Time to arrive for procedure (1100). Remain NPO past midnight.  Must have a ride home and a responsible adult to remain with them for 24 hours post procedure. Instructed to take am meds with sip of water  and confirmed blood thinner consistency.  Patient reports understanding and no other questions at this time.

## 2023-12-07 ENCOUNTER — Ambulatory Visit (HOSPITAL_COMMUNITY): Admitting: Anesthesiology

## 2023-12-07 ENCOUNTER — Encounter (HOSPITAL_COMMUNITY): Admission: RE | Disposition: A | Discharge status: Home / Self Care | Payer: Self-pay | Source: Home / Self Care

## 2023-12-07 ENCOUNTER — Ambulatory Visit (HOSPITAL_COMMUNITY): Admission: RE | Admit: 2023-12-07 | Discharge: 2023-12-07 | Disposition: A

## 2023-12-07 ENCOUNTER — Other Ambulatory Visit (HOSPITAL_COMMUNITY): Payer: Self-pay | Admitting: *Deleted

## 2023-12-07 ENCOUNTER — Other Ambulatory Visit: Payer: Self-pay

## 2023-12-07 DIAGNOSIS — D591 Autoimmune hemolytic anemia, unspecified: Secondary | ICD-10-CM | POA: Insufficient documentation

## 2023-12-07 DIAGNOSIS — I509 Heart failure, unspecified: Secondary | ICD-10-CM | POA: Insufficient documentation

## 2023-12-07 DIAGNOSIS — I2510 Atherosclerotic heart disease of native coronary artery without angina pectoris: Secondary | ICD-10-CM | POA: Diagnosis not present

## 2023-12-07 DIAGNOSIS — I4510 Unspecified right bundle-branch block: Secondary | ICD-10-CM | POA: Diagnosis not present

## 2023-12-07 DIAGNOSIS — I110 Hypertensive heart disease with heart failure: Secondary | ICD-10-CM | POA: Insufficient documentation

## 2023-12-07 DIAGNOSIS — E785 Hyperlipidemia, unspecified: Secondary | ICD-10-CM | POA: Insufficient documentation

## 2023-12-07 DIAGNOSIS — I4891 Unspecified atrial fibrillation: Secondary | ICD-10-CM

## 2023-12-07 DIAGNOSIS — Z79899 Other long term (current) drug therapy: Secondary | ICD-10-CM | POA: Insufficient documentation

## 2023-12-07 DIAGNOSIS — E782 Mixed hyperlipidemia: Secondary | ICD-10-CM | POA: Diagnosis not present

## 2023-12-07 DIAGNOSIS — Z006 Encounter for examination for normal comparison and control in clinical research program: Secondary | ICD-10-CM

## 2023-12-07 DIAGNOSIS — N400 Enlarged prostate without lower urinary tract symptoms: Secondary | ICD-10-CM | POA: Insufficient documentation

## 2023-12-07 DIAGNOSIS — I5033 Acute on chronic diastolic (congestive) heart failure: Secondary | ICD-10-CM | POA: Diagnosis not present

## 2023-12-07 DIAGNOSIS — Z8673 Personal history of transient ischemic attack (TIA), and cerebral infarction without residual deficits: Secondary | ICD-10-CM | POA: Diagnosis not present

## 2023-12-07 DIAGNOSIS — D6869 Other thrombophilia: Secondary | ICD-10-CM | POA: Insufficient documentation

## 2023-12-07 DIAGNOSIS — Z7901 Long term (current) use of anticoagulants: Secondary | ICD-10-CM | POA: Insufficient documentation

## 2023-12-07 DIAGNOSIS — I4819 Other persistent atrial fibrillation: Secondary | ICD-10-CM | POA: Diagnosis not present

## 2023-12-07 HISTORY — PX: CARDIOVERSION: EP1203

## 2023-12-07 SURGERY — CARDIOVERSION (CATH LAB)
Anesthesia: General

## 2023-12-07 MED ORDER — PROPOFOL 10 MG/ML IV BOLUS
INTRAVENOUS | Status: DC | PRN
  Administered 2023-12-07: 50 mg via INTRAVENOUS

## 2023-12-07 MED ORDER — LIDOCAINE 2% (20 MG/ML) 5 ML SYRINGE
INTRAMUSCULAR | Status: DC | PRN
  Administered 2023-12-07: 60 mg via INTRAVENOUS

## 2023-12-07 MED ORDER — SODIUM CHLORIDE 0.9 % IV SOLN: INTRAVENOUS | Status: DC

## 2023-12-07 SURGICAL SUPPLY — 1 items: PAD DEFIB RADIO PHYSIO CONN (PAD) ×1 IMPLANT

## 2023-12-07 NOTE — Interval H&P Note (Signed)
 History and Physical Interval Note:  12/07/2023 11:56 AM  Jason Abbott.  has presented today for surgery, with the diagnosis of AFIB.  The various methods of treatment have been discussed with the patient and family. After consideration of risks, benefits and other options for treatment, the patient has consented to  Procedure(s): CARDIOVERSION (N/A) as a surgical intervention.  The patient's history has been reviewed, patient examined, no change in status, stable for surgery.  I have reviewed the patient's chart and labs.  Questions were answered to the patient's satisfaction.     Sabria Florido H Azobou Tonleu

## 2023-12-07 NOTE — Research (Signed)
 [facility] Cardioversion Informed Consent   Subject Name: Jason Abbott.  Subject met inclusion and exclusion criteria.  The informed consent form, study requirements and expectations were reviewed with the subject and questions and concerns were addressed prior to the signing of the consent form.  The subject verbalized understanding of the trial requirements.  The subject agreed to participate in the [facility] Cardioversion trial and signed the informed consent at 1121 on 01/Dec/2025.  The informed consent was obtained prior to performance of any protocol-specific procedures for the subject.  A copy of the signed informed consent was given to the subject and a copy was placed in the subject's medical record.   Rosaline BIRCH Jayln Madeira

## 2023-12-07 NOTE — Anesthesia Preprocedure Evaluation (Signed)
 Anesthesia Evaluation  Patient identified by MRN, date of birth, ID band Patient awake    Reviewed: Allergy & Precautions, NPO status , Patient's Chart, lab work & pertinent test results, reviewed documented beta blocker date and time   Airway Mallampati: II  TM Distance: >3 FB Neck ROM: Full    Dental no notable dental hx. (+) Teeth Intact, Dental Advisory Given   Pulmonary former smoker   Pulmonary exam normal breath sounds clear to auscultation       Cardiovascular +CHF  Normal cardiovascular exam+ dysrhythmias (eliquis ) Atrial Fibrillation  Rhythm:Irregular Rate:Normal  TTE 2025  1. Left ventricular ejection fraction, by estimation, is 50 to 55%. The  left ventricle has low normal function. The left ventricle has no regional  wall motion abnormalities. There is mild concentric left ventricular  hypertrophy. Left ventricular  diastolic parameters are consistent with Grade I diastolic dysfunction  (impaired relaxation).   2. Right ventricular systolic function was not well visualized. The right  ventricular size is not well visualized.   3. The mitral valve is normal in structure. Trivial mitral valve  regurgitation. No evidence of mitral stenosis.   4. The aortic valve is tricuspid. Aortic valve regurgitation is not  visualized. Aortic valve sclerosis is present, with no evidence of aortic  valve stenosis.   5. Aortic dilatation noted. There is moderate dilatation of the ascending  aorta, measuring 47 mm.     Neuro/Psych CVA  negative psych ROS   GI/Hepatic Neg liver ROS, PUD,GERD  ,,  Endo/Other  negative endocrine ROS    Renal/GU negative Renal ROS  negative genitourinary   Musculoskeletal  (+) Arthritis ,    Abdominal   Peds  Hematology  (+) Blood dyscrasia (CLL)   Anesthesia Other Findings 78 y.o. male with a history of TAA s/p repair, nonobstructive CAD, RBBB, CLL, autoimmune hemolytic anemia, HLD,  remote CVA, BPH, and persistent atrial fibrillation   Reproductive/Obstetrics                              Anesthesia Physical Anesthesia Plan  ASA: 3  Anesthesia Plan: General   Post-op Pain Management:    Induction: Intravenous  PONV Risk Score and Plan: Propofol  infusion and Treatment may vary due to age or medical condition  Airway Management Planned: Natural Airway  Additional Equipment:   Intra-op Plan:   Post-operative Plan:   Informed Consent: I have reviewed the patients History and Physical, chart, labs and discussed the procedure including the risks, benefits and alternatives for the proposed anesthesia with the patient or authorized representative who has indicated his/her understanding and acceptance.     Dental advisory given  Plan Discussed with: CRNA  Anesthesia Plan Comments:          Anesthesia Quick Evaluation

## 2023-12-07 NOTE — Transfer of Care (Signed)
 Immediate Anesthesia Transfer of Care Note  Patient: Jason Abbott.  Procedure(s) Performed: CARDIOVERSION  Patient Location: PACU and Cath Lab  Anesthesia Type:General  Level of Consciousness: drowsy  Airway & Oxygen  Therapy: Patient Spontanous Breathing  Post-op Assessment: Report given to RN  Post vital signs: Reviewed and stable  Last Vitals:  Vitals Value Taken Time  BP 104/82 12/07/23 12:15  Temp 36.5 C 12/07/23 12:15  Pulse 55 12/07/23 12:15  Resp 15 12/07/23 12:15  SpO2 96 % 12/07/23 12:15    Last Pain:  Vitals:   12/07/23 1215  TempSrc: Tympanic  PainSc: Asleep         Complications: No notable events documented.

## 2023-12-07 NOTE — Discharge Instructions (Signed)

## 2023-12-07 NOTE — CV Procedure (Signed)
   DIRECT CURRENT CARDIOVERSION  NAME:  Jason Abbott.    MRN: [id number] DOB:  04-May-1945    ADMIT DATE: 12/07/2023  Indication:  Symptomatic atrial fibrillation  Procedure Note:  The patient signed informed consent.  They have had had therapeutic anticoagulation with Eliquis  greater than 3 weeks.  Anesthesia was administered by Dr. Niels.  Adequate airway was maintained throughout and vital followed per protocol.  They were cardioverted x 1 with 200J of biphasic synchronized energy.  They converted to NSR.  There were no apparent complications.  The patient had normal neuro status and respiratory status post procedure with vitals stable as recorded elsewhere.    Follow up: They will continue on current medical therapy and follow up with cardiology as scheduled.  Joelle DEL. Ren Ny, MD, [facility]  [facility]  12:14 PM

## 2023-12-08 ENCOUNTER — Encounter (HOSPITAL_COMMUNITY): Payer: Self-pay

## 2023-12-08 NOTE — Anesthesia Postprocedure Evaluation (Signed)
 Anesthesia Post Note  Patient: Jason Abbott.  Procedure(s) Performed: CARDIOVERSION     Patient location during evaluation: Cath Lab Anesthesia Type: General Level of consciousness: awake and alert Pain management: pain level controlled Vital Signs Assessment: post-procedure vital signs reviewed and stable Respiratory status: spontaneous breathing, nonlabored ventilation, respiratory function stable and patient connected to nasal cannula oxygen  Cardiovascular status: blood pressure returned to baseline and stable Postop Assessment: no apparent nausea or vomiting Anesthetic complications: no   No notable events documented.  Last Vitals:  Vitals:   12/07/23 1225 12/07/23 1235  BP: 109/76 105/73  Pulse: (!) 54 (!) 52  Resp: 13 16  Temp:    SpO2: 99% 97%    Last Pain:  Vitals:   12/07/23 1235  TempSrc:   PainSc: 0-No pain                 Romaldo Saville L Rainna Nearhood

## 2023-12-11 ENCOUNTER — Ambulatory Visit: Admitting: Urology

## 2023-12-11 NOTE — Progress Notes (Deleted)
 Assessment: 1. Recurrent UTI   2. Benign prostatic hyperplasia with incomplete bladder emptying   3. Incomplete bladder emptying     Plan: I personally reviewed the patient's chart including provider notes, lab results. Continue tamsulosin  and finasteride .  Chief Complaint: No chief complaint on file.   HPI: Jason Abbott. is a 78 y.o. male who presents for continued evaluation of BPH with lower urinary tract symptoms, history of UTI and incomplete bladder emptying. He was previously followed by Dr. Shona and was last seen in December 2024. He underwent cystoscopy in August 2024 which showed prostate enlargement.  Renal ultrasound in 2022 was negative.  He has a history of PVRs in the 200-300 mm range. He was started on combination therapy with tamsulosin  and finasteride  as well as low-dose cephalexin  for UTI prevention. He was doing well at the time of his visit in 12/24.  IPSS was 5. PVR = 202 mL. Urinalysis clear.   Portions of the above documentation were copied from a prior visit for review purposes only.  Allergies: Allergies  Allergen Reactions   Levaquin  [Levofloxacin ] Other (See Comments)    Hx of aortic aneurysm, has since been repaired.     PMH: Past Medical History:  Diagnosis Date   Aberrant right subclavian artery    Aortic arch aneurysm    Arthritis    Atrial fibrillation with rapid ventricular response (HCC) 04/03/2023   Bowel obstruction (HCC)    CLL (chronic lymphocytic leukemia) (HCC)    COVID 2021   hospitalized for it   Diverticul disease small and large intestine, no perforati or abscess    GERD (gastroesophageal reflux disease)    uses tums as needed   H/O urinary infection    Pneumonia 09/22/2021   Shingles outbreak 10/03/2013   Stroke (HCC) 01/2019    PSH: Past Surgical History:  Procedure Laterality Date   AORTIC ARCH DEBRANCHING N/A 01/10/2022   Procedure: AORTIC ARCH DEBRANCHING WITH 16 X 8 X 10 MM HEMASHIELD GOLD GRAFT;   Surgeon: Lucas Dorise POUR, MD;  Location: [facility] OR;  Service: Open Heart Surgery;  Laterality: N/A;   ATRIAL FIBRILLATION ABLATION N/A 09/17/2023   Procedure: ATRIAL FIBRILLATION ABLATION;  Surgeon: Kennyth Chew, MD;  Location: [facility] INVASIVE CV LAB;  Service: Cardiovascular;  Laterality: N/A;   CARDIOVERSION N/A 04/06/2023   Procedure: CARDIOVERSION;  Surgeon: Barbaraann Darryle Ned, MD;  Location: [facility] INVASIVE CV LAB;  Service: Cardiovascular;  Laterality: N/A;   CARDIOVERSION N/A 09/08/2023   Procedure: CARDIOVERSION;  Surgeon: Sheena Pugh, DO;  Location: [facility] INVASIVE CV LAB;  Service: Cardiovascular;  Laterality: N/A;   CARDIOVERSION N/A 12/07/2023   Procedure: CARDIOVERSION;  Surgeon: Ren Donley Joelle VEAR, MD;  Location: [facility] INVASIVE CV LAB;  Service: Cardiovascular;  Laterality: N/A;   CAROTID-SUBCLAVIAN BYPASS GRAFT Right 12/18/2021   Procedure: RIGHT BYPASS GRAFT CAROTID-SUBCLAVIAN;  Surgeon: Serene Gaile LELON, MD;  Location: [facility] OR;  Service: Vascular;  Laterality: Right;   CARPAL TUNNEL RELEASE Right 11/25/2021   Procedure: CARPAL TUNNEL RELEASE;  Surgeon: Romona Harari, MD;  Location: [facility] OR;  Service: Orthopedics;  Laterality: Right;  or MAC with regional block 60   HERNIA REPAIR     MECKEL DIVERTICULUM EXCISION     ORIF FINGER / THUMB FRACTURE     SVT ABLATION N/A 09/17/2023   Procedure: SVT ABLATION;  Surgeon: Kennyth Chew, MD;  Location: [facility] INVASIVE CV LAB;  Service: Cardiovascular;  Laterality: N/A;   TEE WITHOUT CARDIOVERSION N/A 01/10/2022   Procedure:  TRANSESOPHAGEAL ECHOCARDIOGRAM (TEE);  Surgeon: Lucas Dorise POUR, MD;  Location: [facility] OR;  Service: Open Heart Surgery;  Laterality: N/A;   TENOSYNOVECTOMY Right 11/25/2021   Procedure: FLEXOR TENOSYNOVECTOMY;  Surgeon: Romona Harari, MD;  Location: [facility] OR;  Service: Orthopedics;  Laterality: Right;  or MAC with regional block 60   THORACIC AORTIC ENDOVASCULAR STENT GRAFT N/A 01/10/2022   Procedure: THORACIC AORTIC ENDOVASCULAR STENT  GRAFT;  Surgeon: Serene Gaile ORN, MD;  Location[facility];  Service: Vascular;  Laterality: N/A;   TRANSESOPHAGEAL ECHOCARDIOGRAM (CATH LAB) N/A 04/06/2023   Procedure: TRANSESOPHAGEAL ECHOCARDIOGRAM;  Surgeon: Barbaraann Darryle Ned, MD;  Location[facility] INVASIVE CV LAB;  Service: Cardiovascular;  Laterality: N/A;   ulna nerve surgery      SH: Social History   Tobacco Use   Smoking status: Former    Current packs/day: 0.00    Average packs/day: 0.5 packs/day for 23.0 years (11.5 ttl pk-yrs)    Types: Cigarettes    Start date: 79    Quit date: 1989    Years since quitting: 36.9   Smokeless tobacco: Never   Tobacco comments:    Former smoker 10/15/23  Vaping Use   Vaping status: Never Used  Substance Use Topics   Alcohol use: Not Currently    Comment: rarely   Drug use: No    ROS: Constitutional:  Negative for fever, chills, weight loss CV: Negative for chest pain, previous MI, hypertension Respiratory:  Negative for shortness of breath, wheezing, sleep apnea, frequent cough GI:  Negative for nausea, vomiting, bloody stool, GERD  PE: There were no vitals taken for this visit. GENERAL APPEARANCE:  Well appearing, well developed, well nourished, NAD HEENT:  Atraumatic, normocephalic, oropharynx clear NECK:  Supple without lymphadenopathy or thyromegaly ABDOMEN:  Soft, non-tender, no masses EXTREMITIES:  Moves all extremities well, without clubbing, cyanosis, or edema NEUROLOGIC:  Alert and oriented x 3, normal gait, CN II-XII grossly intact MENTAL STATUS:  appropriate BACK:  Non-tender to palpation, No CVAT SKIN:  Warm, dry, and intact   Results: U/A:    PVR =

## 2023-12-18 ENCOUNTER — Ambulatory Visit: Attending: Physician Assistant | Admitting: Physician Assistant

## 2023-12-18 VITALS — BP 142/74 | HR 57 | Ht 70.5 in

## 2023-12-18 DIAGNOSIS — D6869 Other thrombophilia: Secondary | ICD-10-CM | POA: Diagnosis not present

## 2023-12-18 DIAGNOSIS — I5020 Unspecified systolic (congestive) heart failure: Secondary | ICD-10-CM | POA: Diagnosis not present

## 2023-12-18 DIAGNOSIS — I4510 Unspecified right bundle-branch block: Secondary | ICD-10-CM

## 2023-12-18 DIAGNOSIS — I4819 Other persistent atrial fibrillation: Secondary | ICD-10-CM

## 2023-12-18 NOTE — Patient Instructions (Addendum)
 Medication Instructions:   Your physician recommends that you continue on your current medications as directed. Please refer to the Current Medication list given to you today.   *If you need a refill on your cardiac medications before your next appointment, please call your pharmacy*   Lab Work: NONE ORDERED  TODAY   If you have labs (blood work) drawn today and your tests are completely normal, you will receive your results only by: MyChart Message (if you have MyChart) OR A paper copy in the mail If you have any lab test that is abnormal or we need to change your treatment, we will call you to review the results.  Testing/Procedures: NONE ORDERED  TODAY    Follow-Up: At [facility], you and your health needs are our priority.  As part of our continuing mission to provide you with exceptional heart care, our providers are all part of one team.  This team includes your primary Cardiologist (physician) and Advanced Practice Providers or APPs (Physician Assistants and Nurse Practitioners) who all work together to provide you with the care you need, when you need it.  Your next appointment:   3 month(s)   Provider:    KENNYTH IVER ARTHUR      We recommend signing up for the patient portal called MyChart.  Sign up information is provided on this After Visit Summary.  MyChart is used to connect with patients for Virtual Visits (Telemedicine).  Patients are able to view lab/test results, encounter notes, upcoming appointments, etc.  Non-urgent messages can be sent to your provider as well.   To learn more about what you can do with MyChart, go to [email or web address].   Other Instructions

## 2023-12-18 NOTE — Progress Notes (Signed)
 Cardiology Office Note:  .   Date:  12/18/2023  ID:  Jason Abbott., DOB 1945-08-06, MRN [id number] PCP: Jason Debby PARAS, MD  [facility] Providers Cardiologist:  None Electrophysiologist:  Jason Kitty, MD {  History of Present Illness: .   Jason Abbott Jason Abbott. is a 78 y.o. 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 Planned for ablation  Had another hospitalization with RVR in September  AFib ablation 09/17/23  Saw the AFib clinic 10/15/23, no symptoms of AFib via symptoms or his KardiaMobile, no procedural concerns  Saw the Afib clinic again in 12/02/23, karida mobile detected Afib a couple days before, was in AFib at this visit Planned for DCCV  DCCV 12/08/23 > SR   Today's visit is scheduled as his 90 day post afib ablation visit ROS:   He is accompanied by his wife. Post DCCV feels better, more stamina again Seems he has some baseline SOB, but in AFib is worse.  No overt cardiac awareness of his AFib His wife mentions he has gaine weight but doesn't really eaach much He feels a bit bloated No CP, no rest SOB No bleeding or signs of bleeding Good medication compliance  He is active, keeps busy This season not as busy as usual, he plays the accordion/sings, does small concerts at SNF , retirement communities, he loves it and keeps him busy.  Arrhythmia/AAD hx AFib Afib ablation 09/17/23  Studies Reviewed: SABRA    EKG done today and reviewed by myself:  SB 57bpm, RBBB, LAD  09/17/23: EPS/ablation CONCLUSIONS: 1. Successful PVI. 2. No inducible arrhythmias during EP study. 3. Intracardiac echo reveals normal LV size and function, trivial pericardial effusion. 4. No early apparent  complications. 5. Resume Eliquis  in recovery area.  Echo 06/29/23  1. Left ventricular ejection fraction, by estimation, is 50 to 55%. The  left ventricle has low normal function. The left ventricle has no regional  wall motion abnormalities. There is mild concentric left ventricular  hypertrophy. Left ventricular  diastolic parameters are consistent with Grade I diastolic dysfunction  (impaired relaxation).   2. Right ventricular systolic function was not well visualized. The right  ventricular size is not well visualized.   3. The mitral valve is normal in structure. Trivial mitral valve  regurgitation. No evidence of mitral stenosis.   4. The aortic valve is tricuspid. Aortic valve regurgitation is not  visualized. Aortic valve sclerosis is present, with no evidence of aortic  valve stenosis.   5. Aortic dilatation noted. There is moderate dilatation of the ascending  aorta, measuring 47 mm.   Zio 05/13/23:  Patch Wear Time:  13 days and 23 hours (2025-04-11T13:26:32-0400 to 2025-04-25T13:26:24-0400) HR 41 - 200, average 59 bpm. Frequent nonsustained SVT and 1 nonsustained VT (longest 8 beats). Sustained SVT also observed, longest episode 4 minutes and 3 seconds. No atrial fibrillation detected. Rare supraventricular ectopy. Occasional ventricular ectopy, 3.3%. There was 1 symptom trigger episode which corresponded to sinus with ectopy.   TEE 04/06/23:  1. Left ventricular ejection fraction, by estimation, is 25 to 30%. The  left ventricle has severely decreased function. The left ventricle  demonstrates global hypokinesis.   2. Right ventricular systolic function is moderately reduced. The right  ventricular size is moderately enlarged.   3. Left atrial size was severely dilated. No left atrial/left atrial  appendage thrombus was detected. The LAA emptying velocity was 20 cm/s.   4. Right atrial size was severely dilated.   5. The mitral valve is grossly normal. Trivial mitral  valve  regurgitation. No evidence of mitral stenosis.   6. The aortic valve is tricuspid. Aortic valve regurgitation is trivial.  No aortic stenosis is present.   7. Aortic dilatation noted. There is moderate dilatation of the ascending  aorta, measuring 42 mm.   8. Evidence of atrial level shunting detected by color flow Doppler.  There is a small patent foramen ovale with predominantly left to right  shunting across the atrial septum.   9. 3D performed of the LAA and 3D performed for the EF and demonstrates  No LAA thrombus and confirmed by 3D.   09/20/2021: Coronary CT IMPRESSION: 1. 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 Low attenuation plaque with positive remodeling in proximal LAD, CADRADS 2V.   2. Probable moderate mixed plaque in the small caliber mid first diagonal branch, 50-69% stenosis, with proximal vessel lumen <2 mm. Due to vessel size, CT FFR will not be performed.   3. Coronary calcium  score is 436, which places the patient in the 58th percentile for age and sex matched control.   4. Normal coronary origins with right dominance.   5. Dilated thoracic aorta not fully evaluated on this exam. Mild dilation of sinus of Valsalva (41 mm) and mid ascending aorta (40 mm).  Risk Assessment/Calculations:    Physical Exam:   VS:  There were no vitals taken for this visit.   Wt Readings from Last 3 Encounters:  12/02/23 204 lb (92.5 kg)  12/02/23 208 lb 3.2 oz (94.4 kg)  11/20/23 209 lb 12.8 oz (95.2 kg)    GEN: Well nourished, well developed in no acute distress NECK: No JVD; No carotid bruits CARDIAC: RRR, no murmurs, rubs, gallops RESPIRATORY:  CTA b/l without rales, wheezing or rhonchi  ABDOMEN: Soft, non-tender, non-distended EXTREMITIES: No edema; No deformity   ASSESSMENT AND PLAN: .    persistent AFib CHA2DS2Vasc is at least 6, on Eliquis , appropriately dosed no burden since DCCV by symptoms and kardia  checks   NICM HFrecEF He reports a bit of bloating and weight gain, no obvious volume OL by exam Advised 2-3 days of his PRN lasix /K+ Recovered LVEF by his last Echo June 2025   RBBB no symptoms of bradycardia  CAD Non-obstructive by CT 2023 No symptoms Needs gen cards    Secondary hypercoagulable state 2/2 AFib   Dispo: will have him back in 61mo given some post ablation AFib and volume,  sooner if needed  Signed, Charlies Macario Arthur, PA-C

## 2024-01-21 ENCOUNTER — Other Ambulatory Visit: Payer: Self-pay | Admitting: Urology

## 2024-01-21 DIAGNOSIS — R339 Retention of urine, unspecified: Secondary | ICD-10-CM

## 2024-01-21 DIAGNOSIS — N390 Urinary tract infection, site not specified: Secondary | ICD-10-CM

## 2024-01-21 DIAGNOSIS — N401 Enlarged prostate with lower urinary tract symptoms: Secondary | ICD-10-CM

## 2024-01-21 MED ORDER — TAMSULOSIN HCL 0.4 MG PO CAPS: 0.4000 mg | ORAL_CAPSULE | Freq: Two times a day (BID) | ORAL | 0 refills | Status: AC

## 2024-01-21 MED ORDER — CEPHALEXIN 250 MG PO CAPS: 250.0000 mg | ORAL_CAPSULE | Freq: Every day | ORAL | 0 refills | Status: AC

## 2024-01-23 ENCOUNTER — Other Ambulatory Visit: Payer: Self-pay | Admitting: Hematology & Oncology

## 2024-01-23 DIAGNOSIS — D509 Iron deficiency anemia, unspecified: Secondary | ICD-10-CM

## 2024-01-23 DIAGNOSIS — C9110 Chronic lymphocytic leukemia of B-cell type not having achieved remission: Secondary | ICD-10-CM

## 2024-01-23 DIAGNOSIS — D696 Thrombocytopenia, unspecified: Secondary | ICD-10-CM

## 2024-01-23 DIAGNOSIS — D591 Autoimmune hemolytic anemia, unspecified: Secondary | ICD-10-CM

## 2024-03-18 ENCOUNTER — Inpatient Hospital Stay: Admitting: Hematology & Oncology

## 2024-03-18 ENCOUNTER — Inpatient Hospital Stay: Attending: Hematology & Oncology

## 2024-03-22 ENCOUNTER — Ambulatory Visit: Admitting: Cardiology
